# Patient Record
Sex: Female | Born: 1935
Health system: Southern US, Community
[De-identification: ages and names within clinical notes are randomized; demographics above are authoritative.]

## PROBLEM LIST (undated history)

## (undated) DIAGNOSIS — D696 Thrombocytopenia, unspecified: Secondary | ICD-10-CM

## (undated) DIAGNOSIS — I10 Essential (primary) hypertension: Secondary | ICD-10-CM

## (undated) DIAGNOSIS — I2510 Atherosclerotic heart disease of native coronary artery without angina pectoris: Secondary | ICD-10-CM

## (undated) DIAGNOSIS — I350 Nonrheumatic aortic (valve) stenosis: Secondary | ICD-10-CM

## (undated) DIAGNOSIS — N289 Disorder of kidney and ureter, unspecified: Secondary | ICD-10-CM

## (undated) DIAGNOSIS — E119 Type 2 diabetes mellitus without complications: Secondary | ICD-10-CM

## (undated) DIAGNOSIS — C8590 Non-Hodgkin lymphoma, unspecified, unspecified site: Secondary | ICD-10-CM

## (undated) DIAGNOSIS — I2129 ST elevation (STEMI) myocardial infarction involving other sites: Secondary | ICD-10-CM

## (undated) DIAGNOSIS — I519 Heart disease, unspecified: Secondary | ICD-10-CM

## (undated) DIAGNOSIS — I259 Chronic ischemic heart disease, unspecified: Secondary | ICD-10-CM

## (undated) DIAGNOSIS — N189 Chronic kidney disease, unspecified: Secondary | ICD-10-CM

## (undated) DIAGNOSIS — F0390 Unspecified dementia without behavioral disturbance: Secondary | ICD-10-CM

## (undated) DIAGNOSIS — D693 Immune thrombocytopenic purpura: Principal | ICD-10-CM

## (undated) DIAGNOSIS — I5022 Chronic systolic (congestive) heart failure: Secondary | ICD-10-CM

## (undated) DIAGNOSIS — I340 Nonrheumatic mitral (valve) insufficiency: Secondary | ICD-10-CM

## (undated) DIAGNOSIS — S065X9A Traumatic subdural hemorrhage with loss of consciousness of unspecified duration, initial encounter: Secondary | ICD-10-CM

## (undated) HISTORY — DX: Chronic systolic (congestive) heart failure: I50.22

## (undated) HISTORY — DX: Chronic ischemic heart disease, unspecified: I25.9

## (undated) HISTORY — DX: Nonrheumatic mitral (valve) insufficiency: I34.0

## (undated) HISTORY — DX: Disorder of kidney and ureter, unspecified: N28.9

## (undated) HISTORY — DX: Immune thrombocytopenic purpura: D69.3

## (undated) HISTORY — PX: EYE SURGERY: SHX253

## (undated) HISTORY — PX: CATARACT EXTRACTION W/ INTRAOCULAR LENS  IMPLANT, BILATERAL: SHX1307

## (undated) HISTORY — DX: ST elevation (STEMI) myocardial infarction involving other sites: I21.29

## (undated) HISTORY — DX: Heart disease, unspecified: I51.9

## (undated) HISTORY — DX: Unspecified dementia, unspecified severity, without behavioral disturbance, psychotic disturbance, mood disturbance, and anxiety: F03.90

## (undated) HISTORY — DX: Type 2 diabetes mellitus without complications: E11.9

---

## 2002-10-04 ENCOUNTER — Other Ambulatory Visit: Admission: RE | Admit: 2002-10-04 | Discharge: 2002-10-04 | Payer: Self-pay | Admitting: Otolaryngology

## 2002-10-06 ENCOUNTER — Encounter: Admission: RE | Admit: 2002-10-06 | Discharge: 2002-10-06 | Payer: Self-pay | Admitting: Otolaryngology

## 2002-10-06 ENCOUNTER — Encounter: Payer: Self-pay | Admitting: Otolaryngology

## 2002-10-21 ENCOUNTER — Encounter: Admission: RE | Admit: 2002-10-21 | Discharge: 2002-10-21 | Payer: Self-pay | Admitting: Otolaryngology

## 2002-10-21 ENCOUNTER — Encounter: Payer: Self-pay | Admitting: Otolaryngology

## 2002-10-22 ENCOUNTER — Ambulatory Visit (HOSPITAL_BASED_OUTPATIENT_CLINIC_OR_DEPARTMENT_OTHER): Admission: RE | Admit: 2002-10-22 | Discharge: 2002-10-22 | Payer: Self-pay | Admitting: Otolaryngology

## 2002-10-22 ENCOUNTER — Encounter (INDEPENDENT_AMBULATORY_CARE_PROVIDER_SITE_OTHER): Payer: Self-pay | Admitting: Specialist

## 2002-10-22 HISTORY — PX: OTHER SURGICAL HISTORY: SHX169

## 2002-11-01 ENCOUNTER — Encounter: Payer: Self-pay | Admitting: Otolaryngology

## 2002-11-01 ENCOUNTER — Encounter: Admission: RE | Admit: 2002-11-01 | Discharge: 2002-11-01 | Payer: Self-pay | Admitting: Otolaryngology

## 2002-11-22 ENCOUNTER — Encounter (INDEPENDENT_AMBULATORY_CARE_PROVIDER_SITE_OTHER): Payer: Self-pay | Admitting: Specialist

## 2002-11-22 ENCOUNTER — Ambulatory Visit (HOSPITAL_COMMUNITY): Admission: RE | Admit: 2002-11-22 | Discharge: 2002-11-22 | Payer: Self-pay | Admitting: Hematology & Oncology

## 2002-11-22 HISTORY — PX: BONE MARROW BIOPSY: SHX199

## 2002-11-23 ENCOUNTER — Ambulatory Visit (HOSPITAL_COMMUNITY): Admission: RE | Admit: 2002-11-23 | Discharge: 2002-11-23 | Payer: Self-pay | Admitting: Hematology & Oncology

## 2002-11-23 ENCOUNTER — Encounter: Payer: Self-pay | Admitting: Hematology & Oncology

## 2002-11-24 ENCOUNTER — Ambulatory Visit (HOSPITAL_COMMUNITY): Admission: RE | Admit: 2002-11-24 | Discharge: 2002-11-24 | Payer: Self-pay | Admitting: Hematology & Oncology

## 2002-11-24 ENCOUNTER — Encounter: Payer: Self-pay | Admitting: Hematology & Oncology

## 2002-12-07 ENCOUNTER — Encounter: Payer: Self-pay | Admitting: Hematology & Oncology

## 2002-12-07 ENCOUNTER — Ambulatory Visit (HOSPITAL_COMMUNITY): Admission: RE | Admit: 2002-12-07 | Discharge: 2002-12-07 | Payer: Self-pay | Admitting: Hematology & Oncology

## 2003-03-01 ENCOUNTER — Encounter: Payer: Self-pay | Admitting: Hematology & Oncology

## 2003-03-01 ENCOUNTER — Ambulatory Visit (HOSPITAL_COMMUNITY): Admission: RE | Admit: 2003-03-01 | Discharge: 2003-03-01 | Payer: Self-pay | Admitting: Hematology & Oncology

## 2003-05-05 ENCOUNTER — Ambulatory Visit (HOSPITAL_COMMUNITY): Admission: RE | Admit: 2003-05-05 | Discharge: 2003-05-05 | Payer: Self-pay | Admitting: Hematology & Oncology

## 2003-05-05 ENCOUNTER — Encounter: Payer: Self-pay | Admitting: Hematology & Oncology

## 2003-08-08 ENCOUNTER — Ambulatory Visit (HOSPITAL_COMMUNITY): Admission: RE | Admit: 2003-08-08 | Discharge: 2003-08-08 | Payer: Self-pay | Admitting: Hematology & Oncology

## 2003-10-24 ENCOUNTER — Ambulatory Visit (HOSPITAL_COMMUNITY): Admission: RE | Admit: 2003-10-24 | Discharge: 2003-10-24 | Payer: Self-pay | Admitting: Hematology & Oncology

## 2004-01-17 ENCOUNTER — Ambulatory Visit (HOSPITAL_COMMUNITY): Admission: RE | Admit: 2004-01-17 | Discharge: 2004-01-17 | Payer: Self-pay | Admitting: Hematology & Oncology

## 2004-02-06 ENCOUNTER — Ambulatory Visit (HOSPITAL_COMMUNITY): Admission: RE | Admit: 2004-02-06 | Discharge: 2004-02-06 | Payer: Self-pay | Admitting: Hematology & Oncology

## 2004-04-30 ENCOUNTER — Ambulatory Visit (HOSPITAL_COMMUNITY): Admission: RE | Admit: 2004-04-30 | Discharge: 2004-04-30 | Payer: Self-pay | Admitting: Hematology & Oncology

## 2004-09-10 ENCOUNTER — Ambulatory Visit (HOSPITAL_COMMUNITY): Admission: RE | Admit: 2004-09-10 | Discharge: 2004-09-10 | Payer: Self-pay | Admitting: Hematology & Oncology

## 2004-09-26 ENCOUNTER — Ambulatory Visit: Payer: Self-pay | Admitting: Hematology & Oncology

## 2005-01-28 ENCOUNTER — Ambulatory Visit (HOSPITAL_COMMUNITY): Admission: RE | Admit: 2005-01-28 | Discharge: 2005-01-28 | Payer: Self-pay | Admitting: Hematology & Oncology

## 2005-02-08 ENCOUNTER — Ambulatory Visit: Payer: Self-pay | Admitting: Hematology & Oncology

## 2005-07-01 ENCOUNTER — Ambulatory Visit (HOSPITAL_COMMUNITY): Admission: RE | Admit: 2005-07-01 | Discharge: 2005-07-01 | Payer: Self-pay | Admitting: Hematology & Oncology

## 2005-08-09 ENCOUNTER — Ambulatory Visit: Payer: Self-pay | Admitting: Hematology & Oncology

## 2005-09-23 ENCOUNTER — Encounter: Payer: Self-pay | Admitting: Cardiology

## 2005-09-23 ENCOUNTER — Ambulatory Visit: Payer: Self-pay

## 2006-01-13 ENCOUNTER — Ambulatory Visit (HOSPITAL_COMMUNITY): Admission: RE | Admit: 2006-01-13 | Discharge: 2006-01-13 | Payer: Self-pay | Admitting: Hematology & Oncology

## 2006-02-05 ENCOUNTER — Ambulatory Visit: Payer: Self-pay | Admitting: Hematology & Oncology

## 2006-02-10 LAB — CBC WITH DIFFERENTIAL/PLATELET
BASO%: 0.3 % (ref 0.0–2.0)
EOS%: 4.4 % (ref 0.0–7.0)
MCH: 33.4 pg (ref 26.0–34.0)
MCHC: 35.1 g/dL (ref 32.0–36.0)
MONO#: 0.3 10*3/uL (ref 0.1–0.9)
RDW: 13 % (ref 11.3–14.5)
WBC: 3.8 10*3/uL — ABNORMAL LOW (ref 3.9–10.0)
lymph#: 0.8 10*3/uL — ABNORMAL LOW (ref 0.9–3.3)

## 2006-02-10 LAB — COMPREHENSIVE METABOLIC PANEL
ALT: 10 U/L (ref 0–40)
AST: 22 U/L (ref 0–37)
Albumin: 4.5 g/dL (ref 3.5–5.2)
CO2: 25 mEq/L (ref 19–32)
Calcium: 9.4 mg/dL (ref 8.4–10.5)
Chloride: 101 mEq/L (ref 96–112)
Potassium: 3.9 mEq/L (ref 3.5–5.3)
Sodium: 136 mEq/L (ref 135–145)
Total Protein: 6.3 g/dL (ref 6.0–8.3)

## 2006-07-11 ENCOUNTER — Ambulatory Visit: Payer: Self-pay | Admitting: Internal Medicine

## 2006-07-24 ENCOUNTER — Ambulatory Visit: Payer: Self-pay | Admitting: Internal Medicine

## 2006-07-24 ENCOUNTER — Encounter (INDEPENDENT_AMBULATORY_CARE_PROVIDER_SITE_OTHER): Payer: Self-pay | Admitting: Specialist

## 2006-07-25 ENCOUNTER — Ambulatory Visit (HOSPITAL_COMMUNITY): Admission: RE | Admit: 2006-07-25 | Discharge: 2006-07-25 | Payer: Self-pay | Admitting: Hematology & Oncology

## 2006-08-06 ENCOUNTER — Ambulatory Visit: Payer: Self-pay | Admitting: Hematology & Oncology

## 2006-08-11 LAB — CBC WITH DIFFERENTIAL/PLATELET
BASO%: 0.6 % (ref 0.0–2.0)
Basophils Absolute: 0 10*3/uL (ref 0.0–0.1)
Eosinophils Absolute: 0.3 10*3/uL (ref 0.0–0.5)
HCT: 39.7 % (ref 34.8–46.6)
HGB: 13.9 g/dL (ref 11.6–15.9)
LYMPH%: 18.6 % (ref 14.0–48.0)
MONO#: 0.4 10*3/uL (ref 0.1–0.9)
NEUT#: 4.1 10*3/uL (ref 1.5–6.5)
NEUT%: 68.2 % (ref 39.6–76.8)
Platelets: 112 10*3/uL — ABNORMAL LOW (ref 145–400)
WBC: 6 10*3/uL (ref 3.9–10.0)
lymph#: 1.1 10*3/uL (ref 0.9–3.3)

## 2006-08-11 LAB — COMPREHENSIVE METABOLIC PANEL
ALT: 14 U/L (ref 0–35)
BUN: 22 mg/dL (ref 6–23)
CO2: 26 mEq/L (ref 19–32)
Calcium: 9.8 mg/dL (ref 8.4–10.5)
Chloride: 98 mEq/L (ref 96–112)
Creatinine, Ser: 1.21 mg/dL — ABNORMAL HIGH (ref 0.40–1.20)
Glucose, Bld: 243 mg/dL — ABNORMAL HIGH (ref 70–99)
Total Bilirubin: 0.8 mg/dL (ref 0.3–1.2)

## 2006-08-11 LAB — LACTATE DEHYDROGENASE: LDH: 148 U/L (ref 94–250)

## 2007-01-30 ENCOUNTER — Ambulatory Visit (HOSPITAL_COMMUNITY): Admission: RE | Admit: 2007-01-30 | Discharge: 2007-01-30 | Payer: Self-pay | Admitting: Hematology & Oncology

## 2007-02-05 ENCOUNTER — Ambulatory Visit: Payer: Self-pay | Admitting: Hematology & Oncology

## 2007-02-09 LAB — COMPREHENSIVE METABOLIC PANEL
ALT: 15 U/L (ref 0–35)
AST: 25 U/L (ref 0–37)
Albumin: 4 g/dL (ref 3.5–5.2)
CO2: 25 mEq/L (ref 19–32)
Calcium: 9.3 mg/dL (ref 8.4–10.5)
Chloride: 101 mEq/L (ref 96–112)
Potassium: 4 mEq/L (ref 3.5–5.3)

## 2007-02-09 LAB — CBC WITH DIFFERENTIAL/PLATELET
BASO%: 0.1 % (ref 0.0–2.0)
Basophils Absolute: 0 10*3/uL (ref 0.0–0.1)
EOS%: 6 % (ref 0.0–7.0)
HGB: 12.5 g/dL (ref 11.6–15.9)
MCH: 34.1 pg — ABNORMAL HIGH (ref 26.0–34.0)
MONO#: 0.3 10*3/uL (ref 0.1–0.9)
RDW: 12.7 % (ref 11.3–14.5)
WBC: 4.4 10*3/uL (ref 3.9–10.0)
lymph#: 0.8 10*3/uL — ABNORMAL LOW (ref 0.9–3.3)

## 2007-08-20 ENCOUNTER — Ambulatory Visit (HOSPITAL_COMMUNITY): Admission: RE | Admit: 2007-08-20 | Discharge: 2007-08-20 | Payer: Self-pay | Admitting: Hematology & Oncology

## 2007-08-25 ENCOUNTER — Ambulatory Visit: Payer: Self-pay | Admitting: Hematology & Oncology

## 2007-08-27 LAB — CBC WITH DIFFERENTIAL/PLATELET
BASO%: 0.1 % (ref 0.0–2.0)
LYMPH%: 17.7 % (ref 14.0–48.0)
MCHC: 36.2 g/dL — ABNORMAL HIGH (ref 32.0–36.0)
MCV: 94.3 fL (ref 81.0–101.0)
MONO%: 7.2 % (ref 0.0–13.0)
NEUT#: 3.9 10*3/uL (ref 1.5–6.5)
Platelets: 107 10*3/uL — ABNORMAL LOW (ref 145–400)
RBC: 4.27 10*6/uL (ref 3.70–5.32)
RDW: 13.4 % (ref 11.3–14.5)
WBC: 5.5 10*3/uL (ref 3.9–10.0)

## 2007-08-27 LAB — COMPREHENSIVE METABOLIC PANEL
ALT: 17 U/L (ref 0–35)
CO2: 26 mEq/L (ref 19–32)
Calcium: 9.4 mg/dL (ref 8.4–10.5)
Chloride: 95 mEq/L — ABNORMAL LOW (ref 96–112)
Creatinine, Ser: 0.84 mg/dL (ref 0.40–1.20)
Glucose, Bld: 214 mg/dL — ABNORMAL HIGH (ref 70–99)
Sodium: 134 mEq/L — ABNORMAL LOW (ref 135–145)
Total Bilirubin: 1.1 mg/dL (ref 0.3–1.2)
Total Protein: 6.6 g/dL (ref 6.0–8.3)

## 2007-08-27 LAB — LACTATE DEHYDROGENASE: LDH: 167 U/L (ref 94–250)

## 2007-11-20 ENCOUNTER — Ambulatory Visit: Payer: Self-pay | Admitting: Cardiology

## 2007-11-20 ENCOUNTER — Inpatient Hospital Stay (HOSPITAL_COMMUNITY): Admission: EM | Admit: 2007-11-20 | Discharge: 2007-11-27 | Payer: Self-pay | Admitting: Emergency Medicine

## 2007-11-20 ENCOUNTER — Encounter: Payer: Self-pay | Admitting: Cardiothoracic Surgery

## 2007-11-20 ENCOUNTER — Ambulatory Visit: Payer: Self-pay | Admitting: Cardiothoracic Surgery

## 2007-11-20 HISTORY — PX: CORONARY ARTERY BYPASS GRAFT: SHX141

## 2007-11-30 ENCOUNTER — Encounter: Admission: RE | Admit: 2007-11-30 | Discharge: 2007-11-30 | Payer: Self-pay | Admitting: Cardiothoracic Surgery

## 2007-11-30 ENCOUNTER — Ambulatory Visit: Payer: Self-pay | Admitting: Cardiothoracic Surgery

## 2007-12-03 ENCOUNTER — Ambulatory Visit: Payer: Self-pay | Admitting: Cardiothoracic Surgery

## 2007-12-07 ENCOUNTER — Inpatient Hospital Stay (HOSPITAL_COMMUNITY)
Admission: AD | Admit: 2007-12-07 | Discharge: 2007-12-11 | Payer: Self-pay | Admitting: Thoracic Surgery (Cardiothoracic Vascular Surgery)

## 2007-12-07 ENCOUNTER — Ambulatory Visit: Payer: Self-pay | Admitting: Thoracic Surgery (Cardiothoracic Vascular Surgery)

## 2007-12-17 ENCOUNTER — Ambulatory Visit: Payer: Self-pay | Admitting: Thoracic Surgery (Cardiothoracic Vascular Surgery)

## 2008-01-01 ENCOUNTER — Ambulatory Visit: Payer: Self-pay | Admitting: Cardiology

## 2008-01-08 ENCOUNTER — Ambulatory Visit: Payer: Self-pay | Admitting: Cardiothoracic Surgery

## 2008-01-08 ENCOUNTER — Encounter: Admission: RE | Admit: 2008-01-08 | Discharge: 2008-01-08 | Payer: Self-pay | Admitting: Cardiothoracic Surgery

## 2008-03-07 ENCOUNTER — Ambulatory Visit: Payer: Self-pay | Admitting: Cardiology

## 2008-03-08 ENCOUNTER — Ambulatory Visit: Payer: Self-pay | Admitting: Hematology & Oncology

## 2008-03-10 LAB — COMPREHENSIVE METABOLIC PANEL
AST: 29 U/L (ref 0–37)
Albumin: 4.6 g/dL (ref 3.5–5.2)
Alkaline Phosphatase: 101 U/L (ref 39–117)
BUN: 16 mg/dL (ref 6–23)
Calcium: 10 mg/dL (ref 8.4–10.5)
Chloride: 105 mEq/L (ref 96–112)
Glucose, Bld: 123 mg/dL — ABNORMAL HIGH (ref 70–99)
Total Protein: 6.9 g/dL (ref 6.0–8.3)

## 2008-03-10 LAB — CBC WITH DIFFERENTIAL (CANCER CENTER ONLY)
Eosinophils Absolute: 0.2 10*3/uL (ref 0.0–0.5)
MCH: 32.9 pg (ref 26.0–34.0)
MONO#: 0.3 10*3/uL (ref 0.1–0.9)
MONO%: 7.6 % (ref 0.0–13.0)
NEUT#: 2.9 10*3/uL (ref 1.5–6.5)
Platelets: 77 10*3/uL — ABNORMAL LOW (ref 145–400)
RBC: 4.53 10*6/uL (ref 3.70–5.32)
WBC: 4.3 10*3/uL (ref 3.9–10.0)

## 2008-05-11 ENCOUNTER — Encounter: Payer: Self-pay | Admitting: Emergency Medicine

## 2008-05-11 ENCOUNTER — Ambulatory Visit: Payer: Self-pay | Admitting: Cardiology

## 2008-05-18 ENCOUNTER — Ambulatory Visit: Payer: Self-pay | Admitting: Cardiology

## 2008-05-18 LAB — CONVERTED CEMR LAB
BUN: 20 mg/dL (ref 6–23)
GFR calc Af Amer: 62 mL/min
GFR calc non Af Amer: 51 mL/min
Glucose, Bld: 102 mg/dL — ABNORMAL HIGH (ref 70–99)
Sodium: 139 meq/L (ref 135–145)

## 2008-06-01 ENCOUNTER — Ambulatory Visit: Payer: Self-pay | Admitting: Cardiology

## 2008-06-01 LAB — CONVERTED CEMR LAB
BUN: 24 mg/dL — ABNORMAL HIGH (ref 6–23)
Calcium: 9.6 mg/dL (ref 8.4–10.5)
GFR calc Af Amer: 69 mL/min
GFR calc non Af Amer: 57 mL/min
Potassium: 4.2 meq/L (ref 3.5–5.1)

## 2008-06-29 ENCOUNTER — Ambulatory Visit: Payer: Self-pay | Admitting: Cardiology

## 2008-08-01 ENCOUNTER — Ambulatory Visit: Payer: Self-pay | Admitting: Cardiology

## 2008-09-08 ENCOUNTER — Ambulatory Visit: Payer: Self-pay | Admitting: Hematology & Oncology

## 2008-10-29 DIAGNOSIS — I2129 ST elevation (STEMI) myocardial infarction involving other sites: Secondary | ICD-10-CM | POA: Insufficient documentation

## 2008-10-29 DIAGNOSIS — I5022 Chronic systolic (congestive) heart failure: Secondary | ICD-10-CM | POA: Insufficient documentation

## 2008-10-29 DIAGNOSIS — I080 Rheumatic disorders of both mitral and aortic valves: Secondary | ICD-10-CM | POA: Insufficient documentation

## 2008-10-29 DIAGNOSIS — I259 Chronic ischemic heart disease, unspecified: Secondary | ICD-10-CM

## 2008-11-03 ENCOUNTER — Ambulatory Visit: Payer: Self-pay | Admitting: Cardiology

## 2008-11-03 ENCOUNTER — Encounter: Payer: Self-pay | Admitting: Cardiology

## 2008-11-03 DIAGNOSIS — I10 Essential (primary) hypertension: Secondary | ICD-10-CM

## 2008-11-03 LAB — CONVERTED CEMR LAB
Calcium: 9.7 mg/dL (ref 8.4–10.5)
Chloride: 98 meq/L (ref 96–112)
Creatinine, Ser: 0.9 mg/dL (ref 0.4–1.2)
GFR calc non Af Amer: 64.18 mL/min (ref >60)
Potassium: 4.4 meq/L (ref 3.5–5.1)

## 2008-12-23 ENCOUNTER — Telehealth: Payer: Self-pay | Admitting: Cardiology

## 2009-03-03 ENCOUNTER — Encounter (INDEPENDENT_AMBULATORY_CARE_PROVIDER_SITE_OTHER): Payer: Self-pay | Admitting: *Deleted

## 2009-05-10 ENCOUNTER — Ambulatory Visit: Payer: Self-pay | Admitting: Cardiology

## 2009-05-10 DIAGNOSIS — E782 Mixed hyperlipidemia: Secondary | ICD-10-CM

## 2009-05-10 DIAGNOSIS — I252 Old myocardial infarction: Secondary | ICD-10-CM | POA: Insufficient documentation

## 2009-05-10 DIAGNOSIS — I6529 Occlusion and stenosis of unspecified carotid artery: Secondary | ICD-10-CM

## 2009-06-20 ENCOUNTER — Ambulatory Visit (HOSPITAL_COMMUNITY): Admission: RE | Admit: 2009-06-20 | Discharge: 2009-06-20 | Payer: Self-pay | Admitting: Cardiology

## 2009-06-20 ENCOUNTER — Encounter: Payer: Self-pay | Admitting: Cardiology

## 2009-06-20 ENCOUNTER — Ambulatory Visit: Payer: Self-pay

## 2009-06-20 ENCOUNTER — Ambulatory Visit: Payer: Self-pay | Admitting: Cardiology

## 2009-06-20 ENCOUNTER — Ambulatory Visit: Payer: Self-pay | Admitting: Cardiovascular Disease

## 2009-07-04 ENCOUNTER — Encounter (INDEPENDENT_AMBULATORY_CARE_PROVIDER_SITE_OTHER): Payer: Self-pay | Admitting: *Deleted

## 2009-07-27 ENCOUNTER — Encounter: Payer: Self-pay | Admitting: Cardiology

## 2009-09-21 ENCOUNTER — Telehealth (INDEPENDENT_AMBULATORY_CARE_PROVIDER_SITE_OTHER): Payer: Self-pay | Admitting: *Deleted

## 2009-10-24 ENCOUNTER — Ambulatory Visit: Payer: Self-pay | Admitting: Cardiology

## 2009-10-30 ENCOUNTER — Telehealth (INDEPENDENT_AMBULATORY_CARE_PROVIDER_SITE_OTHER): Payer: Self-pay | Admitting: *Deleted

## 2009-11-03 ENCOUNTER — Ambulatory Visit: Payer: Self-pay

## 2009-11-20 ENCOUNTER — Encounter (INDEPENDENT_AMBULATORY_CARE_PROVIDER_SITE_OTHER): Payer: Self-pay | Admitting: *Deleted

## 2009-11-21 ENCOUNTER — Telehealth: Payer: Self-pay | Admitting: Cardiology

## 2009-11-23 ENCOUNTER — Ambulatory Visit: Payer: Self-pay | Admitting: Cardiology

## 2009-11-30 ENCOUNTER — Telehealth: Payer: Self-pay | Admitting: Cardiology

## 2009-11-30 LAB — CONVERTED CEMR LAB
BUN: 20 mg/dL (ref 6–23)
CO2: 31 meq/L (ref 19–32)
Chloride: 101 meq/L (ref 96–112)
Creatinine, Ser: 0.9 mg/dL (ref 0.4–1.2)
Glucose, Bld: 139 mg/dL — ABNORMAL HIGH (ref 70–99)

## 2010-02-15 ENCOUNTER — Ambulatory Visit: Payer: Self-pay | Admitting: Cardiology

## 2010-08-12 ENCOUNTER — Encounter: Payer: Self-pay | Admitting: Cardiothoracic Surgery

## 2010-08-12 ENCOUNTER — Encounter: Payer: Self-pay | Admitting: Hematology & Oncology

## 2010-08-19 LAB — CONVERTED CEMR LAB
AST: 29 units/L (ref 0–37)
BUN: 18 mg/dL (ref 6–23)
Cholesterol: 117 mg/dL (ref 0–200)
Creatinine, Ser: 1 mg/dL (ref 0.4–1.2)
Direct LDL: 64.4 mg/dL
HDL: 28 mg/dL — ABNORMAL LOW (ref >39.00)
Triglycerides: 209 mg/dL — ABNORMAL HIGH (ref 0.0–149.0)

## 2010-08-23 NOTE — Assessment & Plan Note (Signed)
Summary: bp check new med/saf  Nurse Visit   Vital Signs:  Patient profile:   77 year old female Height:      60 inches Pulse rhythm:   regular Resp:     16 per minute BP sitting:   153 / 77  (right arm)  Vitals Entered By: Charolotte Capuchin, RN (November 03, 2009 10:11 AM) CC: here for BP check Comments Pt came in for BP check  153/77 and HR 59  pt states her BP at home has been 135-146/60-70.  She states she taking all medications as rxed,  feels well and is very happy with how the new medication is working.  Will forward to Dr Daleen Squibb for his review.   Allergies: No Known Drug Allergies

## 2010-08-23 NOTE — Assessment & Plan Note (Signed)
Summary: per check out/sf   Visit Type:  6  mo f/u Primary Provider:  Lesle Chris  CC:  no cardiac complaints today.  History of Present Illness: Lisa James returns today for followup of her complex cardiac history, and risk factors.  She denies orthopnea, PND or peripheral edema. She is having no angina or ischemic symptoms. She denies any palpitations.  She does not check her blood pressure outside the office. She denies any headache, visual changes. She is compliant with her medications.  She's had no symptoms of TIAs or mini strokes.  Last echocardiogram November 2010. Ejection fraction is 40-45%, she is dyssynergy of the lateral and septal walls, cavity size was normal, mild LVH, mild aortic valve stenosis, mild to moderate mitral regurgitation with mildly thickened leaflets, mildly dilated left atrium, right sided structures appear to be normal.  Current Medications (verified): 1)  Furosemide 20 Mg Tabs (Furosemide) .Marland Kitchen.. 1 Tab Once Daily 2)  Potassium Chloride Crys Cr 20 Meq Cr-Tabs (Potassium Chloride Crys Cr) .Marland Kitchen.. 1 Tab Once Daily 3)  Aspirin 81 Mg Tbec (Aspirin) .... Take One Tablet By Mouth Daily 4)  Metformin Hcl 500 Mg Tabs (Metformin Hcl) .Marland Kitchen.. 1 Tab Two Times A Day 5)  Zoloft 50 Mg Tabs (Sertraline Hcl) .Marland Kitchen.. 1 Tab Once Daily 6)  Tums 500 Mg Chew (Calcium Carbonate Antacid) .... Use As Directed.as Needed 7)  Carvedilol 6.25 Mg Tabs (Carvedilol) .... Take 1 Tablet By Mouth Twice A Day 8)  Losartan Potassium 100 Mg Tabs (Losartan Potassium) .Marland Kitchen.. 1 Tab Once Daily 9)  Lipitor 20 Mg Tabs (Atorvastatin Calcium) .Marland Kitchen.. 1 Once Daily 10)  Vitamin E 400 Unit Caps (Vitamin E) .Marland Kitchen.. 1 Cap Once Daily  Allergies (verified): No Known Drug Allergies  Past History:  Past Surgical History: Last updated: 10/29/2008 CABG x5.Marland Kitchen05/07/2007  Left posterior iliac crest bone marrow biopsy and aspirate...11/22/2002 Excisional biopsy, submental lymph node...10/22/2002  Review of Systems    negative other than history of present illness  Vital Signs:  Patient profile:   77 year old female Height:      60 inches Weight:      127 pounds BMI:     24.89 Pulse rate:   66 / minute Pulse rhythm:   regular BP sitting:   170 / 90  (left arm) Cuff size:   large  Vitals Entered By: Lisa James, CMA (October 24, 2009 9:47 AM)  Physical Exam  General:  Well developed, well nourished, in no acute distress. Head:  normocephalic and atraumatic Eyes:  PERRLA/EOM intact; conjunctiva and lids normal. Neck:  Neck supple, no JVD. No masses, thyromegaly or abnormal cervical nodes. Lungs:  Clear bilaterally to auscultation and percussion. Heart:  systolic murmur at the apex, PMI inferolaterally displaced, regular rate and rhythm, no gallop Msk:  decreased ROM.   Pulses:  pulses normal in all 4 extremities Extremities:  No clubbing or cyanosis. Neurologic:  Alert and oriented x 3. Skin:  Intact without lesions or rashes. Psych:  Normal affect.   Impression & Recommendations:  Problem # 1:  HYPERTENSION, BENIGN (ICD-401.1) Assessment Deteriorated  I rechecked her pressure. She still running a systolic about 170 and diastolic about 85-90. She is beta blocked, has no edema, and is maxed out on losartan. I will add amlodipine 2.5 mg p.o. q. day. We will check electrolytes today. Sodium restriction enforced. She may get some edema with amlodipine. She is away check her blood pressures or she can return here in about 10 days  for blood pressure followup with the nurse. Her goal blood pressure is 140 or less over less than 90. Her updated medication list for this problem includes:    Furosemide 20 Mg Tabs (Furosemide) .Marland Kitchen... 1 tab once daily    Aspirin 81 Mg Tbec (Aspirin) .Marland Kitchen... Take one tablet by mouth daily    Carvedilol 6.25 Mg Tabs (Carvedilol) .Marland Kitchen... Take 1 tablet by mouth twice a day    Losartan Potassium 100 Mg Tabs (Losartan potassium) .Marland Kitchen... 1 tab once daily    Amlodipine Besylate 2.5 Mg  Tabs (Amlodipine besylate) .Marland Kitchen... 1 once daily  Problem # 2:  OLD MYOCARDIAL INFARCTION (ICD-412) Assessment: Unchanged  Her updated medication list for this problem includes:    Aspirin 81 Mg Tbec (Aspirin) .Marland Kitchen... Take one tablet by mouth daily    Carvedilol 6.25 Mg Tabs (Carvedilol) .Marland Kitchen... Take 1 tablet by mouth twice a day    Amlodipine Besylate 2.5 Mg Tabs (Amlodipine besylate) .Marland Kitchen... 1 once daily  Problem # 3:  SYSTOLIC HEART FAILURE, CHRONIC (ICD-428.22) Assessment: Unchanged  Her updated medication list for this problem includes:    Furosemide 20 Mg Tabs (Furosemide) .Marland Kitchen... 1 tab once daily    Aspirin 81 Mg Tbec (Aspirin) .Marland Kitchen... Take one tablet by mouth daily    Carvedilol 6.25 Mg Tabs (Carvedilol) .Marland Kitchen... Take 1 tablet by mouth twice a day    Losartan Potassium 100 Mg Tabs (Losartan potassium) .Marland Kitchen... 1 tab once daily    Amlodipine Besylate 2.5 Mg Tabs (Amlodipine besylate) .Marland Kitchen... 1 once daily  Problem # 4:  CAROTID ARTERY STENOSIS, WITHOUT INFARCTION (ICD-433.10) Assessment: Unchanged Last carotid Dopplers November 2010. Stable. Followup in one year Her updated medication list for this problem includes:    Aspirin 81 Mg Tbec (Aspirin) .Marland Kitchen... Take one tablet by mouth daily  Problem # 5:  ISCHEMIC HEART DISEASE (ICD-414.9) Assessment: Unchanged  Her updated medication list for this problem includes:    Aspirin 81 Mg Tbec (Aspirin) .Marland Kitchen... Take one tablet by mouth daily    Carvedilol 6.25 Mg Tabs (Carvedilol) .Marland Kitchen... Take 1 tablet by mouth twice a day    Amlodipine Besylate 2.5 Mg Tabs (Amlodipine besylate) .Marland Kitchen... 1 once daily  Problem # 6:  MITRAL REGURGITATION (ICD-396.3) Assessment: Unchanged  Patient Instructions: 1)  Your physician recommends that you schedule a follow-up appointment in: B/P CHECK 10 DAYS AND 3 MONTHS WITH DR Burnis Kaser 2)  Your physician recommends that you return for lab work QI:ONGE TODAY V58.69 401.1 3)  Your physician has recommended you make the following change in  your medication: AMLODIPINE 2.5 MG EVERY AM 4)  Your physician has requested that you regularly monitor and record your blood pressure readings at home.  Please use the same machine at the same time of day to check your readings and record them to bring to your follow-up visit. GOAL B/P IS LESS THAN 140/90 Prescriptions: AMLODIPINE BESYLATE 2.5 MG TABS (AMLODIPINE BESYLATE) 1 once daily  #30 x 11   Entered by:   Scherrie Bateman, LPN   Authorized by:   Gaylord Shih, MD, [facility]   Signed by:   Scherrie Bateman, LPN on 95/28/4132   Method used:   Electronically to        [facility]  [address] (retail)       [address]       [place], [place]  [address]       Ph: [phone] or [phone]       Fax: [phone]  RxID:   [id number]

## 2010-08-23 NOTE — Progress Notes (Signed)
  Pt. signed ROI, sent over to [facility] in [facility] for her to process. Cala Bradford Mesiemore  September 21, 2009 10:33 AM'

## 2010-08-23 NOTE — Progress Notes (Signed)
Summary: calling back  Phone Note Call from Patient Call back at Home Phone [phone]   Caller: Patient Reason for Call: Talk to Nurse Summary of Call: per pt calling back to speak with christine. Initial call taken by: Lorne Skeens,  Nov 21, 2009 11:58 AM  Follow-up for Phone Call        [facility] Scherrie Bateman, LPN  Nov 21, 9560 1:23 PM lmtcb Scherrie Bateman, LPN  Nov 23, 1306 9:05 AM spoke with pt re the need for labs will come in on 11/23/09 at 10:30 Follow-up by: Scherrie Bateman, LPN,  Nov 22, 6576 9:39 AM

## 2010-08-23 NOTE — Letter (Signed)
Summary: [facility]  [facility]   Imported By: Marylou Mccoy 11/02/2009 16:54:51  _____________________________________________________________________  External Attachment:    Type:   Image     Comment:   External Document

## 2010-08-23 NOTE — Progress Notes (Signed)
  Phone Note Outgoing Call   Call placed by: Scherrie Bateman, LPN,  October 30, 2009 9:27 AM Call placed to: Patient Summary of Call: [facility] RE LEFT OFFICE  BEFORE LABS WERE DONE ON 10/24/09 Initial call taken by: Scherrie Bateman, LPN,  October 30, 2009 9:28 AM  Follow-up for Phone Call        SPOKE WITH SON RE  THE NEED FOR LABS WILL LET MOTHER KNOW TO CALL  TO GET APPT./CY  Follow-up by: Scherrie Bateman, LPN,  October 31, 2009 11:43 AM  Additional Follow-up for Phone Call Additional follow up Details #1::        LMTCB Scherrie Bateman, LPN  November 01, 2009 12:06 PM LMTCB Scherrie Bateman, LPN  November 08, 2009 8:37 AM    Additional Follow-up for Phone Call Additional follow up Details #2::    LETTER SENT TO PT TO CALL AND SCHEDULE LABS. Follow-up by: Scherrie Bateman, LPN,  Nov 21, 6043 9:21 AM

## 2010-08-23 NOTE — Letter (Signed)
Summary: Generic Letter  Architectural technologist, Main Office  [address]   [place], [place] [address]   Phone: [phone]  Fax: [phone]        Nov 20, 2009 MRN: [id number]    Lisa James [address] [place], [place]  [address]    Dear Ms. [name],   I HAVE MADE SEVERAL ATTEMPTS BY PHONE  RE THE NEED FOR LABS. PLEASE CALL OUR OFFICE TO SCHEDULE.         Sincerely,  Scherrie Bateman, LPN  This letter has been electronically signed by your physician.

## 2010-08-23 NOTE — Progress Notes (Signed)
Summary: returning call  Phone Note Call from Patient Call back at Home Phone [phone]   Caller: Patient Reason for Call: Talk to Nurse Summary of Call: returning call  Initial call taken by: Migdalia Dk,  Nov 30, 2009 11:04 AM  Follow-up for Phone Call        Phone Call Completed Follow-up by: Scherrie Bateman, LPN,  Nov 30, 2009 11:07 AM

## 2010-08-23 NOTE — Assessment & Plan Note (Signed)
Summary: per check out/sf   Visit Type:  rov Primary Provider:  Lesle Chris  CC:  pt states she may get chest discomfort at times and says she will take ASA then it  goes away...denies any other edema or sob...pt states she feels good.  History of Present Illness: Ms Aja returns today for evaluation management of complex cardiac and vascular issues.  She's having no angina or ischemic symptoms. If she pushes herself she has some dyspnea on exertion. She denies orthopnea, PND or edema. He still works.  She's having no symptoms of TIAs or mini strokes. He is very compliant.  Current Medications (verified): 1)  Furosemide 20 Mg Tabs (Furosemide) .Marland Kitchen.. 1 Tab Once Daily 2)  Potassium Chloride Crys Cr 20 Meq Cr-Tabs (Potassium Chloride Crys Cr) .Marland Kitchen.. 1 Tab Once Daily 3)  Aspirin 81 Mg Tbec (Aspirin) .... Take One Tablet By Mouth Daily 4)  Metformin Hcl 500 Mg Tabs (Metformin Hcl) .Marland Kitchen.. 1 Tab Two Times A Day 5)  Zoloft 50 Mg Tabs (Sertraline Hcl) .Marland Kitchen.. 1 Tab Once Daily 6)  Tums 500 Mg Chew (Calcium Carbonate Antacid) .... Use As Directed.as Needed 7)  Carvedilol 6.25 Mg Tabs (Carvedilol) .... Take 1 Tablet By Mouth Twice A Day 8)  Losartan Potassium 100 Mg Tabs (Losartan Potassium) .Marland Kitchen.. 1 Tab Once Daily 9)  Lipitor 20 Mg Tabs (Atorvastatin Calcium) .Marland Kitchen.. 1 Once Daily 10)  Vitamin E 400 Unit Caps (Vitamin E) .Marland Kitchen.. 1 Cap Once Daily 11)  Amlodipine Besylate 2.5 Mg Tabs (Amlodipine Besylate) .Marland Kitchen.. 1 Once Daily  Allergies (verified): No Known Drug Allergies  Past History:  Past Medical History: Last updated: 10/29/2008 MYOCARDIAL INFARCTION, ACUTE, LATERAL Kirsten Spearing (ICD-410.50) SYSTOLIC HEART FAILURE, CHRONIC (ICD-428.22) ISCHEMIC HEART DISEASE (ICD-414.9) MITRAL REGURGITATION (ICD-396.3)  Past Surgical History: Last updated: 10/29/2008 CABG x5.Marland Kitchen05/07/2007  Left posterior iliac crest bone marrow biopsy and aspirate...11/22/2002 Excisional biopsy, submental lymph node...10/22/2002  Family  History: Last updated: 10/29/2008 Family History of Coronary Artery Disease:  Father died of MI and diabetes  Social History: Last updated: 10/29/2008 Widowed  Tobacco Use - No.  Alcohol Use - no Drug Use - no  Risk Factors: Smoking Status: never (10/29/2008)  Review of Systems       negative other than history of present illness  Vital Signs:  Patient profile:   77 year old female Height:      60 inches Weight:      128 pounds BMI:     25.09 Pulse rate:   68 / minute Pulse rhythm:   regular BP sitting:   128 / 72  (left arm) Cuff size:   large  Vitals Entered By: Danielle Rankin, CMA (February 15, 2010 10:45 AM)  Physical Exam  General:  elderly, no acute distress Head:  normocephalic and atraumatic Eyes:  PERRLA/EOM intact; conjunctiva and lids normal. Neck:  Neck supple, no JVD. No masses, thyromegaly or abnormal cervical nodes. Lungs:  Clear bilaterally to auscultation and percussion. Heart:  PMI displaced inferolaterally, soft systolic murmur at the apex, no gallop, regular rate and rhythm Msk:  decreased ROM.  decreased ROM.   Pulses:  pulses normal in all 4 extremities Extremities:  No clubbing or cyanosis. Neurologic:  Alert and oriented x 3. Skin:  Intact without lesions or rashes. Psych:  Normal affect.   Impression & Recommendations:  Problem # 1:  ISCHEMIC HEART DISEASE (ICD-414.9) Assessment Unchanged  Her updated medication list for this problem includes:    Aspirin 81 Mg Tbec (Aspirin) .Marland Kitchen... Take  one tablet by mouth daily    Carvedilol 6.25 Mg Tabs (Carvedilol) .Marland Kitchen... Take 1 tablet by mouth twice a day    Amlodipine Besylate 2.5 Mg Tabs (Amlodipine besylate) .Marland Kitchen... 1 once daily  Problem # 2:  CAROTID ARTERY STENOSIS, WITHOUT INFARCTION (ICD-433.10) Assessment: Unchanged  Her updated medication list for this problem includes:    Aspirin 81 Mg Tbec (Aspirin) .Marland Kitchen... Take one tablet by mouth daily  Problem # 3:  HYPERLIPIDEMIA TYPE IIB / III  (ICD-272.2)  Her updated medication list for this problem includes:    Lipitor 20 Mg Tabs (Atorvastatin calcium) .Marland Kitchen... 1 once daily  Problem # 4:  OLD MYOCARDIAL INFARCTION (ICD-412) Assessment: Unchanged  Her updated medication list for this problem includes:    Aspirin 81 Mg Tbec (Aspirin) .Marland Kitchen... Take one tablet by mouth daily    Carvedilol 6.25 Mg Tabs (Carvedilol) .Marland Kitchen... Take 1 tablet by mouth twice a day    Amlodipine Besylate 2.5 Mg Tabs (Amlodipine besylate) .Marland Kitchen... 1 once daily  Problem # 5:  HYPERTENSION, BENIGN (ICD-401.1)  Her updated medication list for this problem includes:    Furosemide 20 Mg Tabs (Furosemide) .Marland Kitchen... 1 tab once daily    Aspirin 81 Mg Tbec (Aspirin) .Marland Kitchen... Take one tablet by mouth daily    Carvedilol 6.25 Mg Tabs (Carvedilol) .Marland Kitchen... Take 1 tablet by mouth twice a day    Losartan Potassium 100 Mg Tabs (Losartan potassium) .Marland Kitchen... 1 tab once daily    Amlodipine Besylate 2.5 Mg Tabs (Amlodipine besylate) .Marland Kitchen... 1 once daily  Problem # 6:  MITRAL REGURGITATION (ICD-396.3) Assessment: Unchanged  Patient Instructions: 1)  Your physician recommends that you schedule a follow-up appointment in: 6 MONTHS WITH DR Randie Bloodgood 2)  Your physician recommends that you continue on your current medications as directed. Please refer to the Current Medication list given to you today.

## 2010-09-19 ENCOUNTER — Encounter: Payer: Self-pay | Admitting: Cardiology

## 2010-11-06 ENCOUNTER — Other Ambulatory Visit: Payer: Self-pay | Admitting: Cardiology

## 2010-11-22 ENCOUNTER — Encounter: Payer: Self-pay | Admitting: Cardiology

## 2010-11-29 ENCOUNTER — Other Ambulatory Visit: Payer: Self-pay | Admitting: Surgery

## 2010-11-29 ENCOUNTER — Ambulatory Visit: Payer: Self-pay | Admitting: Cardiology

## 2010-12-03 ENCOUNTER — Encounter: Payer: Self-pay | Admitting: Cardiology

## 2010-12-04 NOTE — Op Note (Signed)
Lisa James, Lisa James NO.:  [id number]   MEDICAL RECORD NO.:  [id number]          PATIENT TYPE:  INP   LOCATION:  2311                         FACILITY:  [facility]   PHYSICIAN:  Sheliah Plane, MD    DATE OF BIRTH:  [birth year]   DATE OF PROCEDURE:  11/20/2007  DATE OF DISCHARGE:                               OPERATIVE REPORT   PREOPERATIVE DIAGNOSIS:  Acute myocardial infarction with critical  coronary anatomy.   POSTOPERATIVE DIAGNOSIS:  Acute myocardial infarction with critical  coronary anatomy.   SURGICAL PROCEDURES:  Emergency coronary artery bypass grafting x5 with  left internal mammary to left anterior descending coronary artery,  sequential reverse saphenous vein graft to the first and second obtuse  marginal, reverse saphenous vein graft sequentially to the posterior  descending and posterior lateral branches of the right coronary artery  with right leg endovein harvesting.   SURGEON:  Sheliah Plane, MD   FIRST ASSISTANT:  Rowe Clack, PA-C   BRIEF HISTORY:  The patient is 77 year old female, who on the same day  of surgery after doing some yard work at her home began having sudden  chest pain.  She was brought to the emergency room STEMI by EMS with  EKG changes of acute inferior/posterior myocardial infarction.  She was  taken directly to the cardiac cath lab by Dr. Riley Kill and cardiac  catheterization revealed depressed LV function with mitral regurgitation  and critical coronary anatomy with 99% stenosis of the proximal right,  90% stenosis of the mid posterior descending, and diffuse disease  throughout the right, hypodense area of 90% or more, left main  obstruction, and total occlusion of the circumflex after the takeoff of  a high obtuse marginal.  Because of the critical anatomy, emergency  coronary artery bypass grafting was recommended and the patient agreed.  This was also discussed with the patient's son.  Consideration for intra-  aortic balloon pump was discussed after the patient had severe iliac  disease, making even difficult for cardiac catheterization, so intra-  aortic balloon pump was not placed.   DESCRIPTION OF PROCEDURE:  With Swan-Ganz and arterial line monitors in  place, the patient underwent general endotracheal anesthesia.  A  transesophageal echo probe was placed.  The patient structurally had  intact mitral valve leaflets and no evidence of flare.  There was 1+  central mitral regurgitation definitely was not as severe as evidenced  on the cardiac catheterization.  The skin on the chest and legs was  prepped with Betadine and draped in usual sterile manner.  Using a  [employer]ant endovein harvesting system, the vein was harvested from the  right thigh and calf.  There was a small but of adequate quality and  sized for the patient's small coronary vessels.  Median sternotomy was  performed.  Left internal mammary artery was dissected down as pedicle  graft.  The distal artery was divided had good free flow.  The  pericardium was opened.  Overall, ventricular function appeared  depressed, especially the inferior wall.  The patient had no critical  EKG changes; however, just prior to  catheterization, began having  significant ST elevation, global hypokinesis.  The patient was placed on  cardiopulmonary bypass 2.4 liters per minute per meter squared.  Sites  of anastomosis were selected.  Aortic root vent cardioplegia needle was  introduced into the ascending aorta.  The patient's aortic cross-clamp  was applied and 500 cc of cold blood potassium cardioplegia was  administered with diastolic arrest of the heart.  Attention was turned  first to the right system.  The heart was elevated.  The posterior  descending in the midportion and the larger posterior lateral branches  of each were opened using diamond-type side-to-side anastomosis with a  running 8-0 Prolene.  It was carried out to the mid posterior   descending.  The distal extend of the same vein was then carried onto  the posterior lateral branch which was slightly larger.  Using a running  7-0 Prolene, distal anastomosis was performed.  The valves itself was  approximately 1.3-1.4 mm in size.  The heart was then further elevated.  The very distal circumflex branch was less than 1 mm and not felt big  enough to bypass.  The first obtuse marginal was a small, thin-walled  vessel admitting a 1-mm probe.  The vessel was opened and using running  8-0 Prolene, a side-to-side anastomosis was carried out.  The second  obtuse marginal was partially intramyocardial.  It was a larger and  admitted a 1.5-mm probe.  Using a running 7-0 Prolene, distal  anastomosis was performed.  The attention was then turned to the  anterior wall.  The only vessel evident on the anterior wall was opened  and was 1.2-1.3 mm in size.  Using running 8-0 Prolene, left internal  mammary artery was anastomosed to left anterior descending coronary  artery.  There was rise in myocardial septal temperature with removal of  the bulldog on the mammary artery.  The bulldog was replaced with cross-  clamp still in place.  Two punch aortotomies were performed in the  ascending aorta and each of the two vein grafts were anastomosed to the  ascending aorta with a running 7-0 Prolene.  Air was evacuated from the  heart and from the aorta and the aortic cross-clamp was removed after  the bulldog on the mammary artery had been removed and there was rise in  myocardial septal temperature.  Total crossclamp time was 97 minutes.  The patient was initially in complete heart block but ultimately  converted to a sinus rhythm and was paced for a slow rate.  Sites of  anastomoses were inspected free of bleeding.  The patient was started on  milrinone and epinephrine infusion.  With this, she was weaned from  cardiopulmonary bypass and remained stable with reasonable cardiac index   applied just over to.  She was decannulated in usual fashion.  Protamine  sulfate was administered.  A left pleural tube and a Blake mediastinal  drain were left in place.  Pericardium was reapproximated.  Sternum  closed with #6 stainless steel wire.  Fascia was closed with interrupted  0 Vicryl, running 3-0 Vicryl in the subcutaneous tissue, 4-0  subcuticular stitch in the skin edges.  Dry dressing was applied.  Sponge and needle count was reported as correct at completion of  procedure.  The patient tolerated the procedure well without obvious  complication and was transferred to Surgical Intensive Care Unit for  further postoperative care.      Sheliah Plane, MD  Electronically Signed  EG/MEDQ  D:  11/22/2007  T:  11/22/2007  Job:  [id number]   cc:   Arturo Morton. Riley Kill, MD, [facility]  [name]. [name], M.D.

## 2010-12-04 NOTE — Assessment & Plan Note (Signed)
[facility]                            CARDIOLOGY OFFICE NOTE   NAME:TUTTLEDalal, Livengood                    MRN:          [id number]  DATE:06/01/2008                            DOB:          11-17-1927    Ms. Crocker returns today for close followup.  Please see my notes last  one being May 18, 2008.   She has lost down to 119 pounds in her scales which has been stable over  the last 5 days.  She denies any orthopnea, PND, or peripheral edema.  Her shortness of breath is improved with exertion.   On last visit, we increased her Carvedilol to 25 b.i.d.  We increased  her lisinopril to 5 mg b.i.d.  We will check a Chem-7 today.   PHYSICAL EXAMINATION:  GENERAL:  She looks remarkably good today.  VITAL SIGNS:  Her blood pressure is 122/62, her pulse is 72 and regular,  which is down from 103 initially.  Her weight is 123 down 7 from May 18, 2008.  NECK:  No JVD.  LUNGS:  Clear to auscultation and percussion.  HEART:  A systolic murmur at the apex.  There is no gallop.  ABDOMEN:  Soft, good bowel sounds.  No midline bruits.  EXTREMITIES:  No cyanosis, clubbing, or edema.  Pulses are intact.   ASSESSMENT AND PLAN:  Ms. Cleek is now euvolemic.  We have made no  further changes in her medical program.  I will plan on seeing her back  in 4 weeks.  We will check a chemistry profile today.     Thomas C. Daleen Squibb, MD, [facility]  Electronically Signed    TCW/MedQ  DD: 06/01/2008  DT: 06/01/2008  Job #: [id number]

## 2010-12-04 NOTE — Assessment & Plan Note (Signed)
Lisa James                            CARDIOLOGY OFFICE NOTE   NAME:TUTTLEDiva, Lemberger                    MRN:          [id number]  DATE:08/01/2008                            DOB:          [birth year]    Lisa James comes in today for followup.  Please refer to my previous  notes for her problem list especially the note May 11, 2008.   We have adjusted her medicines over the last several months.  She has  become progressively better.  She denies orthopnea, PND, or peripheral  edema.  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  She wants to go back next Monday, August 08, 2008.   Her meds are unchanged since last visit.  We asked her to increase her  carvedilol 12.5 b.i.d. for the second time, but she is still taking 6.25  b.i.d.   Other cardiovascular meds include:  1. Lisinopril 5 mg p.o. b.i.d.  2. Lipitor 20 mg a day.  3. Furosemide 20 mg a day.  4. Potassium 20 mEq a day.  5. Enteric-coated aspirin 81 mg a day.   PHYSICAL EXAMINATION:  VITAL SIGNS:  Blood pressure today is 144/70, her  pulse is 72 and regular, and weight is 127.  HEENT:  Skin is pale and dry, otherwise unremarkable.  NECK:  Supple.  There is no JVD.  LUNGS:  Clear to auscultation and percussion.  HEART:  A displaced PMI.  She has a very soft systolic murmur at the  apex.  No gallop.  ABDOMEN:  Soft, good bowel sounds.  No midline bruit.  No hepatomegaly.  EXTREMITIES:  No cyanosis, clubbing, or edema.  Pulses are intact.  NEUROLOGIC:  Intact.  MUSCULOSKELETAL:  Chronic arthritic changes.  SKIN:  Few ecchymoses and is warm and dry.   ASSESSMENT AND PLAN:  Lisa James is doing well.  We will continue with  her current medical program for now.  I will see her back again in 3-6  months.     Thomas C. Daleen Squibb, MD, [facility]  Electronically Signed    TCW/MedQ  DD: 08/01/2008  DT: 08/02/2008  Job #: [id number]   cc:   Brett Canales A. [name], M.D.

## 2010-12-04 NOTE — H&P (Signed)
Lisa James, COLBAUGH NO.:  [id number]   MEDICAL RECORD NO.:  [id number]          PATIENT TYPE:  INP   LOCATION:  2027                         FACILITY:  [facility]   PHYSICIAN:  Salvatore Decent. Cornelius Moras, M.D. DATE OF BIRTH:  12-Sep-1927   DATE OF ADMISSION:  12/07/2007  DATE OF DISCHARGE:                              HISTORY & PHYSICAL   REASON FOR ADMISSION:  Right lower extremity cellulitis.   HISTORY OF PRESENT ILLNESS:  The patient is a 77 year old white female  who is status post recent emergency coronary artery bypass grafting on  Nov 20, 2007, by Dr. Tyrone Sage.  After an uneventful postoperative course,  she was discharged home in good condition on Nov 27, 2007.  Following her  discharge, she developed increasing dyspnea and lower extremity edema  and was seen in the office by Dr. Tyrone Sage on Nov 30, 2007.  At that  time, she was started on increased doses of Lasix at 40 mg b.i.d.  She  noted a blister on the forefoot of her right foot with pitting edema.  She returned for a recheck on Dec 03, 2007 and had had significant  improvement of her shortness of breath and lower extremity edema.  However, at that time, her right foot blister had popped.  She was given  a prescription for Silvadene to apply topically to the open area on her  right forefoot and was continued on aggressive diuresis.  Her symptoms  continued to improve until approximately 2 days later at which point she  developed significant erythema around open abrasion on her right  forefoot.  Over the ensuing days, this continued to worsen and her foot  has become markedly swollen and tender.  She denies any fevers or chills  and also reports that she has had no shortness of breath, orthopnea, or  chest pain.  Because of her worsening foot, she was seen in the office  today by Dr. Cornelius Moras and was diagnosed with right foot cellulitis.  She is  currently being admitted for IV antibiotic therapy.   PHYSICAL  EXAMINATION:  VITAL SIGNS:  Blood pressure is 185/65, pulse of  72 and regular, respirations 18 and unlabored.  O2 sat 93% on room air.  Temperature 96.7.  GENERAL:  This is an elderly female in no acute distress.  HEART:  Regular rate and rhythm without murmurs, rubs, or gallops.  LUNGS:  Clear to auscultation.  Her sternal wound has healed well and  her sternum is stable to palpation.  EXTREMITIES:  Her left lower extremity shows no erythema or edema.  Her  right forefoot is markedly erythematous about two-thirds of the way up  with a 3-4 cm open abraded area just proximal to her toes.  There is  trace of erythema extending up into her calf.  She has 2+ edema of the  foot and ankle and some mild edema of the calf.  The area is warm to  touch and tender.  The EVH incision in the right lower extremity also  have some mild surrounding erythema.   ASSESSMENT AND PLAN:  This is a  77 year old female who is status post  emergency coronary artery bypass graft on Nov 20, 2007.  She now presents  with right lower extremity cellulitis in the setting of diabetes  mellitus.  She  has been seen in the office today by Dr. Cornelius Moras and will be admitted and  started on IV vancomycin and Cipro.  We will also start topical local  wound care to the open area on her forefoot.  We will continue her on  aggressive diuresis and check labs and blood cultures upon admission.      Coral Ceo, P.A.      Salvatore Decent. Cornelius Moras, M.D.  Electronically Signed    GC/MEDQ  D:  12/07/2007  T:  12/08/2007  Job:  [id number]   cc:   Dr. Dortha Schwalbe C. Wall, MD, [facility]

## 2010-12-04 NOTE — Discharge Summary (Signed)
NAMEJOLENE, James NO.:  [id number]   MEDICAL RECORD NO.:  [id number]          PATIENT TYPE:  INP   LOCATION:  2008                         FACILITY:  [facility]   PHYSICIAN:  Sheliah Plane, MD    DATE OF BIRTH:  [birth year]   DATE OF ADMISSION:  11/20/2007  DATE OF DISCHARGE:  11/27/2007                               DISCHARGE SUMMARY   PRIMARY ADMITTING DIAGNOSIS:  Chest pain.   ADDITIONAL/DISCHARGE DIAGNOSES:  1. ST-elevation myocardial infarction.  2. Coronary artery disease.  3. Hypertension.  4. Type 2 diabetes mellitus.  5. History of lymphoma status post chemotherapy.   PROCEDURES PERFORMED:  1. Emergency cardiac catheterization.  2. Emergency coronary artery bypass grafting x5 (left internal mammary      artery to the LAD, saphenous vein graft sequentially to the first      and second obtuse marginal, sequential saphenous vein graft to the      PDA and PL).  3. Endoscopic vein harvest, right leg.   HISTORY:  The patient is a 77 year old white female who developed acute  onset of shortness of breath on the date of this admission.  She had had  some shortness of breath earlier while working in the yard, but this  episode was acute and at rest and was associated with some mild  diaphoresis.  She subsequently called EMS and was brought to the  emergency department at [facility]  She was found to have  evidence of an inferior acute ST-elevation myocardial infarction and was  given four baby aspirin and was brought directly to the cardiac cath  lab.   HOSPITAL COURSE:  The patient was transferred from the emergency  department to the cath lab where she underwent cardiac catheterization  by Dr. Riley Kill.  She was found to have 80-90% distal left main with  total occlusion of the circumflex, a diffusely diseased LAD with  multiple 90% stenoses even very distally in the vessel, a proximal 99%  stenosis in the right coronary artery, mid posterior  descending and 90%  along the posterolateral branch.  It was difficult to quantify the  amount of mitral regurgitation, but it was estimated at 2+.  Because of  these findings, cardiac surgery consult was called to the cath lab.  Dr.  Tyrone Sage saw the patient and felt that she should undergo emergency  revascularization at this time.  All the risks, benefits, and  alternatives of surgery were explained to the patient and she was  transferred directly to the operating room.  She underwent emergency  CABG x5 performed by Dr. Tyrone Sage.  She tolerated the procedure well and  was transferred to the SICU in stable condition.  She was able to be  extubated shortly after surgery.  She was hemodynamically stable and  doing well on postop day #1.  She initially required Neo-Synephrine,  epinephrine, and milrinone drips postoperatively; however, these were  weaned and discontinued over the course of her second postoperative day.  She remained in the unit until postop day #3 and at that point was  transferred to the step-down unit.  Overall, her postoperative course  has been uneventful.  She has been started on a beta-blocker, statin,  and an ACE inhibitor.  She appears to be tolerating these medications  well.  She has been very volume overloaded and was started on Lasix for  which she is responding well.  Her incisions are all healing well.  She  remains approximately 10 kilograms above her preoperative weight with  significant lower extremity edema on physical exam.  She has been  afebrile and her vital signs have been stable and she is maintaining  normal sinus rhythm.  Her O2 sats have been greater than 90% on room  air.  She has been switched from the Glucommander to her regular home  diabetes medications and is tolerating this with stable blood sugars.  She is ambulating with cardiac rehab phase one and is making good  progress.  She is tolerating a regular diet.  It is felt that if she   continues to progress, she will hopefully be ready for discharge home on  Nov 27, 2007.   DISCHARGE MEDICATIONS:  1. Enteric-coated aspirin 81 mg daily.  2. Toprol-XL 25 mg daily.  3. Lisinopril 2.5 mg daily.  4. Lasix 40 mg daily x 1 week.  5. Potassium 20 mEq daily x 1 week.  6. Ultram 50-100 mg q.4 h. p.r.n. for pain.  7. Metformin 500 mg b.i.d.  8. Zoloft 50 mg daily.   DISCHARGE INSTRUCTIONS:  She is asked to refrain from driving, heavy  lifting, or strenuous activity.  She may continue ambulating daily and  using her incentive spirometry.  She may shower daily and clean her  incisions with soap and water.  She will continue a low-fat, low-sodium,  and carbohydrate modified diet.   DISCHARGE FOLLOWUP:  She is asked to make an appointment with Dr. Daleen Squibb  in 2 weeks.  She will then see Dr. Tyrone Sage in 3 weeks with a chest x-  ray.  If she experiences problems or have questions in the interim, she  is asked to contact our office.      Coral Ceo, P.A.      Sheliah Plane, MD  Electronically Signed    GC/MEDQ  D:  11/26/2007  T:  11/27/2007  Job:  [id number]   cc:   Brett Canales A. [name], M.D.  Thomas C. Wall, MD, [facility]

## 2010-12-04 NOTE — Cardiovascular Report (Signed)
NAMEKENIDY, Lisa James NO.:  [id number]   MEDICAL RECORD NO.:  [id number]          PATIENT TYPE:  INP   LOCATION:  2311                         FACILITY:  [facility]   PHYSICIAN:  Arturo Morton. Riley Kill, MD, FACCDATE OF BIRTH:  1928-06-22   DATE OF PROCEDURE:  11/20/2007  DATE OF DISCHARGE:                            CARDIAC CATHETERIZATION   INDICATIONS:  Lisa James is a 77 year old who presented with shortness  of breath to the emergency room.  She had an abnormal EKG with a  precordial ST depression, and mild inferior ST elevation.  She was seen  by Dr. Daleen Squibb, and referred emergently to the cardiac catheterization  laboratory.   PROCEDURE:  1. Left heart catheterization.  2. Selective coronary angiography.  3. Selective left ventriculography.  4. Subclavian angiography.   DESCRIPTION OF THE PROCEDURE:  The patient was brought to the  catheterization laboratory, prepped and draped in the usual fashion,  through an anterior puncture the femoral artery was entered.  A femoral  sheath was then placed.  Views of the left and right coronaries then  obtained in multiple angiographic projections.  Subclavian angiography  was performed.  Following this, central aortic and left ventricular  pressures were measured and ventriculography was performed in the RAO  projection.  I then asked Dr. Daleen Squibb, and Dr. Tyrone Sage to come to the  catheterization laboratory for conference on the patient's treatment  strategies.  ACT was checked.  Femoral sheath was sewn into place.  Preparations were made for urgent revascularization and possible mitral  valve repair.   HEMODYNAMIC DATA.:  1. Central aortic pressure was 141/68.  2. Left ventricular pressure was 129/26.  3. There was no gradient or pullback across the aortic valve, mean      aortic pressure was 100.   ANGIOGRAPHIC DATA:  1. There is extensive calcification of all of the coronaries.  2. The left main demonstrates a filling  defect in the distal left main      which may partly be due to calcification and narrowing.  This      involves the distal left main and proximal circumflex and LAD.  The      LAD beyond this has a 70% area of segmental severe plaquing and      then there is 90% narrowing after the diagonal.  The diagonal      itself has several small branches both of which have 90% narrowing.      The circumflex itself provides a first marginal with diffuse      diabetic appearance, and then the vessel is totally occluded.      There is a ghost vessel that fills in behind suggesting at least      two marginal branches.  3. The right coronary artery demonstrates 70% stenosis and a      subtotally occluded.  There is 80-90% mid PDA stenosis and also an      80% posterolateral stenosis.  Both vessels appear to be graftable.  4. Ventriculography in the RAO projection reveals inferior hypokinesis      with ejection fraction of 45%.  There is mitral regurgitation that      is thought to be likely severe.  It would be rated at 3-4+.   IMPRESSION:  1. Total atrioventricular circumflex occlusion with acute myocardial      infarction.  2. A 99% RCA stenosis.  3. A 90% distal left main proximal LAD stenosis.  4. Severe mitral regurgitation and decreased LV function.   RECOMMENDATIONS:  Dr. Tyrone Sage and Dr. Daleen Squibb and I have reviewed the  films.  The patient will require urgent revascularization and possible  mitral valve repair.  Because of her age, she is at increased risk.      Arturo Morton. Riley Kill, MD, [facility]  Electronically Signed     TDS/MEDQ  D:  11/20/2007  T:  11/21/2007  Job:  [id number]   cc:   Berenice Bouton, M.D.  [name] [name], M.D.  Thomas C. Daleen Squibb, MD, [facility]  Arturo Morton. Riley Kill, MD, [facility]  Sheliah Plane, MD

## 2010-12-04 NOTE — Discharge Summary (Signed)
Lisa James, Lisa James NO.:  [id number]   MEDICAL RECORD NO.:  [id number]          PATIENT TYPE:  INP   LOCATION:  2027                         FACILITY:  [facility]   PHYSICIAN:  Salvatore Decent. Cornelius James, M.D. DATE OF BIRTH:  10/28/1927   DATE OF ADMISSION:  12/07/2007  DATE OF DISCHARGE:  12/11/2007                               DISCHARGE SUMMARY   ADMITTING DIAGNOSES:  1. Right lower extremity cellulitis.  2. Status post ST-segment-elevation myocardial infarction.  3. Status post emergent coronary artery bypass grafting x5 with Dr.      Tyrone Sage on Nov 20, 2007.  4. History of diabetes mellitus.  5. History of hypertension.  6. History of lymphoma status post chemotherapy.   DISCHARGE DIAGNOSES:  1. Right lower extremity cellulitis.  2. Status post ST-segment elevation myocardial infarction.  3. Status post emergent coronary artery bypass grafting x5 with Dr.      Tyrone Sage, on Nov 20, 2007.  4. History of diabetes mellitus.  5. History of hypertension.  6. History of lymphoma status post chemotherapy.   BRIEF HOSPITAL COURSE STAY:  This is a 77 year old female, patient of  Dr. Juanda Chance, who is status post coronary artery bypass surgery with Dr.  Tyrone Sage on Nov 20, 2007.  The patient had a fairly uneventful  postoperative course and was discharged home in good condition on Nov 27, 2007.  Following her discharge, she did develop increasing dyspnea and  lower extremity edema.  She was seen by Dr. Tyrone Sage on Nov 30, 2007.  At that time, her Lasix was increased to 40 mg p.o. twice daily.  She  was also noted to have a blister on her right forefoot with pitting  edema.  She then returned to the office on Dec 03, 2007 and had  significant improvement of her shortness of breath and lower extremity  edema.  However, the blister on the right forefoot had popped.  She was  a given prescription for Silvadene to apply topically to the open area  of the right forefoot, and diuresis  was continued.  Approximately 2 days  later, however, she developed significant erythema and an open abrasion  of the right forefoot.  Her right forefoot became more swollen and  tender.  She denied any fever or chills.  Denied any shortness of breath  or orthopnea.  She was admitted to [facility] for further wound care and  IV antibiotics related to the right foot cellulitis.  She was placed on  vancomycin IV and ciprofloxacin 500 mg p.o. twice daily.  She also had  Bactroban ointment placed topically on the right forefoot twice daily.  The patient remained afebrile.  While in the hospital, her vital signs  remained stable.  Her right forefoot wound continued to improve.  There  was decreasing erythema and edema daily.  Her white count was normal at  8300.  She was also placed on diuretics as well.  Blood cultures were  drawn and there was no growth.  By Dec 10, 2007, the patient had no  complaints, again she was afebrile.  Her vital signs remained  stable.   PHYSICAL EXAMINATION:  HEART:  Regular rate and rhythm.  LUNGS:  Clear to auscultation bilaterally.  EXTREMITIES:  The right forefoot has decreased erythema again.  There is  a stable area of abrasion, crusting noted, no edema, and she is most  likely going to be discharged home on Dec 11, 2007.   DISCHARGE INSTRUCTIONS:  Include the following:  1. She is to remain on a carbohydrate-modified medium-calorie diet.  2. She is to keep her right leg elevated when not ambulating,      preferably on 2 pillows.  3. She is to apply Bactroban ointment topically to her right foot      twice daily.  4. She is to increase her activity as tolerated.  5. She may shower.   DISCHARGE MEDICATIONS:  Include the following:  1. Lisinopril 2.5 mg p.o. daily.  2. Avelox 400 mg p.o. daily x7 days.  3. Toprol XL 25 mg p.o. daily.  4. ECSA 800 mg p.o. daily.  5. Metformin 500 mg p.o. twice daily.  6. Zoloft 50 mg p.o. daily.   The patient is to  follow up with [facility] on Dec 17, 2007.      Doree Fudge, PA      Lisa James, M.D.  Electronically Signed    DZ/MEDQ  D:  12/10/2007  T:  12/11/2007  Job:  [id number]   cc:   Everardo Beals. Juanda Chance, MD, [facility]

## 2010-12-04 NOTE — Assessment & Plan Note (Signed)
[facility]                            CARDIOLOGY OFFICE NOTE   NAME:TUTTLETimisha, Mondry                    MRN:          [id number]  DATE:06/29/2008                            DOB:          [birth year]    Ms. Limb comes in today for further management of her systolic heart  failure, mitral regurgitation, ischemic heart disease.  Please see my  previous notes for problem list.   Since I saw her on June 01, 2008, her weight has been stable.  She  is opted not to go back to work until after the [employer] which I think  is appropriate.  It is remarkable if she even works at all.   She denies any chest pain, shortness of breath, orthopnea, PND, or  peripheral edema.  She has had no syncope or presyncope.  She denies any  orthostatic symptoms.   Her medications are essentially unchanged since her last visit.  We  thought she was on 25 b.i.d. of Carvedilol, though she is only taking  12.5 b.i.d.   PHYSICAL EXAMINATION:  VITAL SIGNS:  Her blood pressure today is 132/62,  her pulse is 74 and regular, weight is 125 and stable.  HEENT:  Alert and oriented.  SKIN:  Warm and dry.  Rest is unremarkable.  NECK:  No JVD.  Carotids are full without bruits.  LUNGS:  Clear to auscultation and percussion.  HEART:  Soft systolic murmur at the apex, displaced PMI.  No gallop.  ABDOMINAL:  Soft, good bowel sounds.  No pulsatile mass.  No  hepatomegaly.  EXTREMITIES:  There is no sign of clubbing or edema.  Pulses are intact.   Ms. Plourde is doing remarkably well.  We will leave her Carvedilol with  12.5 b.i.d.  I will plan on seeing her back again in about 6 weeks.  She  returned to work in January.     Thomas C. Daleen Squibb, MD, [facility]  Electronically Signed    TCW/MedQ  DD: 06/29/2008  DT: 06/30/2008  Job #: [id number]

## 2010-12-04 NOTE — Assessment & Plan Note (Signed)
OFFICE VISIT   ITZELL, BENDAVID  DOB:  1927/08/17                                        Dec 07, 2007  CHART #:  [id number]   HISTORY OF PRESENT ILLNESS:  Ms. Vanorman is a 77 year old female who  underwent emergency coronary artery bypass grafting x5 by Dr. Tyrone Sage  on Nov 20, 2007.  Her postoperative recovery was relatively  uncomplicated.  Following hospital discharge, she has had problems with  fluid retention with significant dyspnea on exertion, paroxysmal  nocturnal dyspnea, and lower extremity swelling.  Her dose of Lasix was  increased.  She was last seen here in the office 4 days ago by Dr.  Tyrone Sage at which time she was feeling better.  However, she developed a  blister on the dorsum of her right foot.  At that time, there were no  signs of infection.  She was given a prescription for Silvadene cream to  apply to this blistered area on the dorsum of her foot, and no other  changes in her medications were made.  Ms. Oswald returns to the office  today with concerns regarding significant development of redness  involving the right foot.  She states that this really began  approximately 2 days ago and has spread up to foot and now beginning to  spread up the ankle.  The swelling has decreased.  She has not had  fevers.  She does not have any pain.  She reports no shortness of  breath.  She has no chest discomfort.   PHYSICAL EXAMINATION:  VITAL SIGNS:  An elderly female who was afebrile.  CHEST:  Her sternal incision is healing nicely.  LUNGS:  Breath sounds are clear to auscultation.  EXTREMITIES:  Examination of the lower extremities is notable for mild  right lower extremity edema.  There is bright red advancing cellulitis  beginning along the dorsum of the right foot which extends up just past  the ankle.  Small incisions from right thigh and right lower leg  endoscopic vein harvest incision are healing reasonably well with a few  dry  eschars.   IMPRESSION:  Right lower leg and foot cellulitis, status post emergency  coronary artery bypass grafting with endoscopic vein procurement from  the right thigh and right lower leg.  The cellulitis has developed  fairly quickly over the last 2 days and appears impressive on exam.  I  suspect that inpatient admission for intravenous antibiotics is the most  prudent course of management.   PLAN:  We will send Ms. Dayrit to the hospital for admission today.   Salvatore Decent. Cornelius Moras, M.D.  Electronically Signed   CHO/MEDQ  D:  12/07/2007  T:  12/07/2007  Job:  [id number]   cc:   Sheliah Plane, MD  Everardo Beals. Juanda Chance, MD, [facility]

## 2010-12-04 NOTE — Assessment & Plan Note (Signed)
[facility]                            CARDIOLOGY OFFICE NOTE   NAME:Lisa James, Lisa James                    MRN:          [id number]  DATE:05/18/2008                            DOB:          01-18-1928    Ms.  Purington returns today for followup.  Please see my note from May 11, 2008.   Since beginning furosemide 20 mg q.a.m., potassium 20 mEq q.a.m. and  changing her Toprol to Carvedilol 6.25, increasing her lisinopril to 2.5  b.i.d, she has felt better.  She does not weigh herself but she feels  like her edema has resolved.  She denies orthopnea, PND, chest pain, and  her shortness of breath with exertion has improved.   Her 2-D echo has been formally read and is in my last note.  Her EF is  30%.  She has severe mitral regurgitation, increased left and right  sided filling pressures, no pericardial effusion.   She is not sure she is taking lisinopril b.i.d., but think she is.   PHYSICAL EXAMINATION:  VITAL SIGNS:  Today, her blood pressure is  138/80, her pulse is 86 down from 103.  She has a respiratory rate of  18.  GENERAL:  Alert and oriented x3.  She looks comfortable much more so  than last week.  NECK:  No obvious JVD.  Carotids are full.  LUNGS:  Clear to auscultation and percussion without rales.  HEART:  A soft systolic murmur at the apex.  No gallop.  ABDOMEN:  Soft, good bowel sounds.  EXTREMITIES:  No edema today.  Pulses are intact.  NEURO:  Intact.   Ms. Onnen is doing remarkably better.  We will try to bring her heart  rate down a little further by increasing Carvedilol to 25 b.i.d.  In  addition, I am going to increase her lisinopril for after load reduction  to 5 b.i.d.  We will check a Chem-7 today.  I will see her back in 2  weeks.     Thomas C. Daleen Squibb, MD, [facility]  Electronically Signed    TCW/MedQ  DD: 05/18/2008  DT: 05/18/2008  Job #: [id number]

## 2010-12-04 NOTE — Assessment & Plan Note (Signed)
OFFICE VISIT   HOLLIDAY, Lisa James  DOB:  [birth year]                                        Dec 17, 2007  CHART #:  [id number]   Ms. Lisa James is status post emergency coronary artery bypass grafting x5  done by Dr. Tyrone Sage Nov 20, 2007.  The patient's postoperative course  was pretty much unremarkable.  She was discharged home in stable  condition.  She had returned to the office Dec 07, 2007 with complaints  of right lower leg and foot pain with wound and redness.  She was seen  by Dr. Cornelius Moras and required admission to [facility] for IV  antibiotics.  The patient presents today for followup visit of right  foot cellulitis.  The patient states that her right foot cellulitis and  wound is healing.  She has one more dose of the Avelox left.  She has  been using Bactroban as directed.  She states that she has not been  covering her foot.  She denies any drainage, increased redness, fever,  or pain at site.  She is ambulating without difficulty.   PHYSICAL EXAMINATION:  VITAL SIGNS:  Blood pressure 108/68, pulse 96,  respiration of 18, O2 saturation 98% on room air.  RESPIRATORY:  Clear to auscultation bilaterally.  CARDIAC:  Regular rate and rhythm.  EXTREMITIES:  No edema noted in the lower extremities.  She has mild  erythema surrounding healing wound.  Good granulation tissue noted.  No  purulent drainage, cellulitis, or tenderness noted.  Incision is clean,  dry and intact and healing.   IMPRESSION AND PLAN:  The patient is status post emergency coronary  artery bypass grafting seen for followup of her right foot cellulitis.  She was seen and evaluated by Dr. Dorris Fetch.  The patient is to finish  her Avelox dose as directed.  She is to stop the Bactroban ointment.  We  will check a complete blood count and a basic metabolic panel today, as  Dr. Tyrone Sage had wanted prior to her followup visit for a coronary  bypass grafting.  She will see Dr.  Tyrone Sage when he returns from [facility] with an x-ray.  The patient is to continue ambulating  3-4 times per day, and she is to contact us if she develops any fever,  increased redness, swelling, or drainage from her wound.   Salvatore Decent Dorris Fetch, M.D.  Electronically Signed   Lisa James/MEDQ  D:  12/17/2007  T:  12/17/2007  Job:  [id number]   cc:   Salvatore Decent. Dorris Fetch, M.D.  Arturo Morton. Riley Kill, MD, [facility]

## 2010-12-04 NOTE — Assessment & Plan Note (Signed)
OFFICE VISIT   Lisa James, Lisa James  DOB:  03/02/1928                                        Dec 03, 2007  CHART #:  [id number]   The patient returns to the office today in follow-up after her recent  emergency coronary artery bypass grafting for acute myocardial  infarction on Nov 20, 2007. the patient was seen several days ago in the  office because of dyspnea on exertion and paroxysmal nocturnal dyspnea  and pedal edema and blister formation on her right foot and increased  weight.  Her Lasix dose was increased.  She returns now 3 days later for  a follow-up check feeling much better.  She notes her shortness of  breath is much improved and she is able to sleep.   On exam today her blood pressure is 137/77, pulse is 94, respiratory  rate is 18, O2 saturation is 92%.  Her sternum is stable and healing.  She still has pitting edema in both lower extremities, right greater  than left.  The blistering on her foot has now popped and there is a raw  skin area on the dorsum of her right foot.  The dorsum of the right foot  is more swollen than the left.  The vein harvest sites are healing  without obvious infection.   The patient's weight was 142-1/2 on Monday.  Today on Thursday it is 135-  1/2.   I have instructed her to continue to increase her Lasix dose and have  given her a prescription for 40 mg p.o. b.i.d. and potassium 20 mEq p.o.  b.i.d.  Also, she has been given 1% Silvadene cream to apply 2-3 times a  day to the dorsum of her left foot.  We will plan to have her return  with a chest x-ray, CBC, BMET in 1 week to check the status of her edema  and the heel of her foot.   She continues on:  1. Aspirin 81 mg a day.  2. Toprol 25 mg a day.  3. Lisinopril 2.5 mg a day.  4. Lasix 40 mg b.i.d.  5. Potassium 20 mEq b.i.d.  6. Ultram as needed.  7. Metformin 500 mg b.i.d.  8. Zoloft 50 daily.   Sheliah Plane, MD  Electronically Signed   EG/MEDQ   D:  12/03/2007  T:  12/03/2007  Job:  [id number]   cc:   Everardo Beals. Juanda Chance, MD, [facility]

## 2010-12-04 NOTE — Assessment & Plan Note (Signed)
OFFICE VISIT   ALEXX, GIAMBRA  DOB:  10/20/1927                                        January 08, 2008  CHART #:  [id number]   Ms. Korol returns to the office after coronary artery bypass grafting  done as an emergent case on October 21, 2007.  She was subsequently  readmitted with cellulitis of her right foot, probably not associated  with the vein harvesting.  She was treated with IV antibiotics for 4-5  days and ultimately discharged.  She returns to the office today to  check her foot.  Overall, she feels very well and just returned from  being out shopping.  Denies any congestive heart failure symptoms or  recurrent angina.   On examination, blood pressure 120/75, pulse is 80, respiratory rate is  18, O2 sat is 94%.  Sternum is stable and well healed.  Lungs are clear.  The patient's lower extremity edema which had been present when she  developed cellulitis and her foot is now almost completely resolved.  The area of cellulitis in the dorsum of the foot still has some slight  darkening of the skin, but otherwise without evidence of an infection or  cellulitis and as noted with marked decrease in edema.   Chest x-ray was done today, shows interval resolution of bilateral  pleural effusions.   Overall, the patient seems to be making good progress postoperatively.  I have not made a return appointment, but she is aware to be careful  with what shoe she wears to avoid blistering and to contact us to seek  medical attention if she develops any further symptoms of cellulitis in  the foot.   Sheliah Plane, MD  Electronically Signed   EG/MEDQ  D:  01/08/2008  T:  01/09/2008  Job:  [id number]   cc:   Jesse Sans. Wall, MD, [facility]

## 2010-12-04 NOTE — Assessment & Plan Note (Signed)
[facility]                            CARDIOLOGY OFFICE NOTE   NAME:Lisa James, Lisa James                    MRN:          [id number]  DATE:05/11/2008                            DOB:          01-08-1928    Ms. Olejniczak comes in today per the request of Dr. Earl Lites.  She has  had progressive shortness of breath over the last couple of weeks.  Dr.  [name] performed a chest x-ray, which showed small bilateral effusions.  He was concerned about heart failure.   We brought her over today.  She had a 2D echocardiogram, which shows  preliminary an EF of 30%, severe mitral regurgitation, and increased  filling pressures.  There is no pericardial effusion.   Of note, she had an acute inferior lateral wall infarct with severe 3-  vessel disease in May.  She underwent an emergent coronary artery  bypass grafting x5 by Dr. Tyrone Sage. A postoperative TEE showed an  improvement in her severe mitral regurgitation at the time of acute cath  to 1-2+.   Her EF prior to surgery was 45%.   She denies any orthopnea.  She has had some mild peripheral edema.  She  is short of breath with walking.  She has had no chest pain,  tachypalpitations, syncope, or presyncope.   CURRENT MEDICATIONS:  1. Enteric-coated aspirin 81 mg a day.  2. Toprol-XL 25 mg a day.  3. Lisinopril 2.5 mg a day.  4. Metformin 500 b.i.d.  5. Zoloft 50 mg a day.  6. Lipitor 20 mg nightly.   Her CBC at Dr. Ellis Parents office was normal.   PHYSICAL EXAMINATION:  GENERAL:  She is very pleasant.  She is in no  acute distress.  Alert and oriented x3.  SKIN:  Warm and dry.  VITAL SIGNS:  Blood pressure is 136/80, her pulse was 103 and sinus  tach.  EKG shows no acute changes.  HEENT:  Otherwise unremarkable.  NECK:  Carotid upstrokes were equal bilateral without bruits.  She does  have JVD and she has a hepatojugular reflux.  Her liver is 3 fingerbreadths below the right costal margin and is  slightly  tender.  LUNGS:  Clear with no major decreased breath sounds and no rales.  HEART:  Reveals a displaced PMI.  She has a systolic murmur at the apex.  There is no obvious S3 gallop.  S2 splits physiologically.  ABDOMEN:  Soft, good bowel sounds.  No pulsatile mass, no hepatomegaly.  EXTREMITIES:  No cyanosis or clubbing, but she does have trace to 1+  pitting edema.  Pulses are intact.  NEUROLOGIC:  Intact.   ASSESSMENT:  1. Ischemic cardiomyopathy with acute systolic heart failure.  2. Severe mitral regurgitation.  This is probably from infarct      expansion and damage during the infarct itself.   PLAN:  1. Begin furosemide 20 mg q.a.m.  2. Begin potassium 20 mEq q.a.m.  3. Change Toprol-XL to Carvedilol 6.25 b.i.d.  4. Increase lisinopril to 2.5 b.i.d.  5. Follow up with me in a week.     Maisie Fus  Argie Ramming, MD, [facility]  Electronically Signed    TCW/MedQ  DD: 05/11/2008  DT: 05/12/2008  Job #: [id number]   cc:   Brett Canales A. [name], M.D.

## 2010-12-04 NOTE — Assessment & Plan Note (Signed)
[name]                            CARDIOLOGY OFFICE NOTE   NAME:TUTTLETyrah, Lisa James                    MRN:          [id number]  DATE:03/07/2008                            DOB:          1927/11/21    Ms. Lisa James returns today for further management of her coronary artery  disease.  She is now status post a non-ST segment elevated MI, filling  defect in the left main, status post emergent coronary bypass grafting  on Nov 20, 2007.   She is doing remarkably well having now recovered from her right lower  extremity cellulitis.   She is following along with Dr. [name] at [facility].  Her last cholesterol was 106, LDL was 48, HDL was 27, and triglycerides  was 155.  Hemoglobin A1c 5.8.   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  School starts on 03/15/2008.  She has paper  work being for me to fill out today.  She reminds me that her mother is  70 years old.   Her medicines today are aspirin 81 mg a day, Toprol-XL 25 mg a day,  lisinopril 2.5 mg a day, metformin 500 b.i.d., Zoloft 50 mg a day,  Lipitor 20 mg nightly, and potassium 10 mEq a day.   Her blood pressure today is 127/68, her pulse is 80 and regular, weight  is 124 and stable.  HEENT is unchanged.  Carotids are full without  bruits.  No JVD.  Thyroid is not enlarged.  Trachea is midline.  Lungs  are clear.  Heart reveals a regular rate and rhythm.  No gallop.  Abdominal exam is soft.  Extremities reveal no edema and she has a  bruised left ankle and foot from a sprain.  Pulses were present.  She  has some varicose veins.  There is no sign of DVT.  Neuro exam is  intact.   ASSESSMENT/PLAN:  [name] is doing remarkably well.  I have released  her to go back to work.  I will see her back again in January 2010.     Thomas C. Daleen Squibb, MD, [facility]  Electronically Signed    TCW/MedQ  DD: 03/07/2008  DT: 03/08/2008   Job #: [id number]   cc:   Brett Canales A. [name], M.D.

## 2010-12-04 NOTE — H&P (Signed)
NAMECARROL, Lisa James             ACCOUNT NO.:  [id number]   MEDICAL RECORD NO.:  [id number]          PATIENT TYPE:  INP   LOCATION:  2311                         FACILITY:  [facility]   PHYSICIAN:  Jesse Sans. Wall, MD, FACCDATE OF BIRTH:  1927-08-10   DATE OF ADMISSION:  11/20/2007  DATE OF DISCHARGE:                              HISTORY & PHYSICAL   PRIMARY CARDIOLOGIST:  [name], MD, [facility]   PRIMARY CARE PHYSICIAN:  [name]. [name], M.D.   HISTORY OF PRESENT ILLNESS:  This is a 77 year old Caucasian female with  history of hypertension, diabetes, sudden onset of shortness of breath  around 1:00 p.m. today while sitting watching television.  The patient  had been out in the yard earlier pulling up violet bulbs with these  symptoms at that time.  Because of this sudden onset of shortness of  breath and some mild diaphoresis, the patient called EMS.  When they  arrived, they found that the patient was having an inferior ST-elevated  MI with some reciprocal changes.  She was given 4 baby aspirin in the  emergency room and was seen and examined by Dr. [name] and  confirmed ST-elevated MI.  The patient was transferred emergently to  cardiac catheterization lab for catheterization and possible PCI.   REVIEW OF SYSTEMS:  Positive for sudden onset of shortness of breath.  Negative for chest pain, nausea, and vomiting.  Positive for mild  diaphoresis.   PAST MEDICAL HISTORY:  1. Hypertension.  2. Diabetes.   PAST SURGICAL HISTORY:  Hernia repair.   SOCIAL:  She lives in [place] in [place], alone.  She is  retired.  She is a widow.  She does not smoke, does not drink alcohol.   FAMILY HISTORY:  Mother is alive at age 62 and is in good health.  Father died of an MI, who also had a history of diabetes.  Siblings, the  patient has one brother and one sister, both of which are in good  health.   CURRENT MEDICATIONS AT HOME:  1. Metformin 500 mg twice a day.  2.  Norvasc 10 mg daily.  3. Zoloft 50 mg daily.  4. Klor-Con 10 mEq daily.   ALLERGIES:  No known drug allergies.   PHYSICAL EXAMINATION:  VITAL SIGNS:  Blood pressure 139/83, pulse 90,  respirations 20, O2 sat 93% on room air, and weight 125 pounds.  GENERAL:  She is awake, alert, and oriented.  Breathing status is  stabilized.  HEENT:  Head is normocephalic and atraumatic.  Eyes, PERRLA.  Mucous  membranes, mouth pink and moist.  Tongue is midline.  NECK:  Supple.  There is no JVD.  No carotid bruits appreciated.  CARDIOVASCULAR:  Regular rate and rhythm without murmurs, rubs, or  gallops.  Pulses are 2+ and equal bilaterally without bruits noted.  LUNGS:  Clear to auscultation.  ABDOMEN:  Soft, nontender, 2+ bowel sounds.  Extremities:  Without clubbing, cyanosis, or edema.  NEURO:  Cranial nerves II through XII are grossly intact.   Labs are stat and pending.  The patient is currently on  cardiac  catheterization table as dictation is being done.   IMPRESSION:  1. ST-elevated myocardial infarction.  A:  Symptoms of sudden onset of shortness of breath with EKG changes  with ST elevation in lead II, III, aVL with reciprocal changes  anteriorly.  1. Hypertension.  2. Diabetes.   PLAN:  The patient has been seen and examined in the emergency room by  Dr. [name] and confirmed ST-elevated MI.  The patient was given 4  baby aspirin and transferred emergently to the cardiac catheterization  lab, where she is being attended by Dr. [name].  We will admit  the patient and make following followup recommendations and treatment  based upon her response and hospital course.      Bettey Mare. Lyman Bishop, NP      Jesse Sans. Daleen Squibb, MD, [facility]  Electronically Signed    KML/MEDQ  D:  11/20/2007  T:  11/21/2007  Job:  [id number]   cc:   Brett Canales A. [name], M.D.

## 2010-12-04 NOTE — Assessment & Plan Note (Signed)
OFFICE VISIT   MANON, BANBURY  DOB:  08/23/1927                                        Nov 30, 2007  CHART #:  [id number]   Ms. July 10 days ago underwent emergency coronary artery bypass  grafting for acute myocardial infarction. She was discharged home two  days ago but came to the office today because she noted increasing  swelling in both feet and some shortness of breath when she lays down  and trouble sleeping.  She was originally discharged home on Lasix 40 mg  a day but was only taking 20 mg once a day.   PHYSICAL EXAMINATION:  VITALS:  On exam today her blood pressure 154/89,  pulse is 94, respiratory rate 18, O2 sats 93%.  She weighs 144 1/2  pounds. She notes her usual weight is 124.  RESPIRATORY:  She has decreased breath sounds at the base. Her sternum  is stable and well-healed.  EXTREMITIES:  She has 2+ pitting edema in both lower extremities in the  calves and at the ankle.   Follow-up chest x-ray shows moderate bilateral pleural effusions.   IMPRESSION:  The patient with postoperative fluid retention and  congestive heart failure.  At rest she is comfortable and in no distress  and will increase her diuretics to 40 mg p.o. b.i.d. for today, tomorrow  and Thursday.  I plan on seeing her back in the office in 3 days to  reevaluate her, check her weight and adjust Lasix dose accordingly.   Sheliah Plane, MD  Electronically Signed   EG/MEDQ  D:  11/30/2007  T:  11/30/2007  Job:  [id number]   cc:   Arturo Morton. Riley Kill, MD, [facility]

## 2010-12-04 NOTE — Consult Note (Signed)
Lisa James, SIEBEL NO.:  [id number]   MEDICAL RECORD NO.:  [id number]          PATIENT TYPE:  INP   LOCATION:  2311                         FACILITY:  [facility]   PHYSICIAN:  Sheliah Plane, MD    DATE OF BIRTH:  1928/05/04   DATE OF CONSULTATION:  11/20/2007  DATE OF DISCHARGE:                                 CONSULTATION   CARDIOLOGIST:  Jesse Sans. Daleen Squibb, MD, [facility]   PRIMARY CARE PHYSICIAN:  [name]. Daub, M.D.   CHIEF COMPLAINT:  Acute myocardial infarction, inferolateral.   HISTORY OF PRESENT ILLNESS:  The patient is a 77 year old female who has  had 3 weeks' history of increasing dyspnea on exertion and fatigue.  This afternoon, she had sudden onset of shortness of breath around 1  p.m. while watching TV.  Earlier in the day, she had been in her yard  pulling up weeds, was brought in by EMS.  Code STEMI was called and the  patient was given 4 baby aspirin and brought to the cath lab.   PAST MEDICAL HISTORY:  She has a previous history of diabetes.   SOCIAL HISTORY:  She is a nonsmoker.   PAST SURGICAL HISTORY:  Previous surgery includes hiatal hernia.   SOCIAL AND FAMILY HISTORY:  The patient's mother is alive at 57.  Father died of myocardial infarction.  She has 1 brother and 1 sister in  good health.   CURRENT MEDICATIONS:  1. Metformin 500 mg twice a day.  2. Norvasc 10 mg a day.  3. Zoloft 50 mg a day.  4. Potassium 10 mEq a day.   REVIEW OF SYSTEMS:  As noted above sudden onset of dyspnea on exertion  and shortness of breath.  She has had some symptoms of fatigue.  Denies  any amaurosis or TIAs.  Denies previous stroke.  Denies change in bowel  habits.  Denies blood in her stool or urine.   PHYSICAL EXAMINATION:  GENERAL:  The patient is in the cath lab, awake  and neurologically intact.  VITAL SIGNS:  Blood pressure 130/80, respiratory rate is 20, sinus  rhythm at 90, and estimated weight 125 pounds.  NECK:  The patient has no carotid  bruits.  CARDIAC:  I did not appreciate any murmur of mitral insufficiency,  though in the setting of exams, difficult to get good exam.  ABDOMEN:  Benign without palpable masses.  EXTREMITIES:  She has decreased pulses in both feet, both feet appear  viable.   LABORATORY FINDINGS:  Pending.   Cardiac catheterization films were reviewed with Dr. Daleen Squibb and Dr.  Riley Kill.  The patient has probably 2+ MR, though on the ventriculogram  it is difficult to quantitate.  There is some inferior hypokinesis.  She  has 80-90% distal left main total occlusion of the circumflex.  A small  first obtuse marginal comes off the right coronary artery.  The LAD is  diffusely diseased distally with multiple 90% stenosis even very distal  in the vessel.  The right coronary artery has large vessel with a  proximal 99% stenosis, mid posterior descending 90% with a  long  posterior lateral branch.   IMPRESSION:  Acute myocardial infarction with critical 3-vessel anatomy  and at least moderate mitral regurgitation, difficult to evaluate  quantitatively.   PLAN:  Emergency coronary artery bypass graft and replacement of a TEE.  Dr. Riley Kill notes difficulty in gaining vascular access to the lower  extremities to do the cath, which will make entering of balloon pump  difficult.  The increased risk of procedure discussed with the patient  and her son.  At the time of surgery, we will evaluate the mitral valve  with TEE and decide if intervention on the mitral valve was also  indicated.      Sheliah Plane, MD  Electronically Signed     EG/MEDQ  D:  11/20/2007  T:  11/21/2007  Job:  [id number]   cc:   Arturo Morton. Riley Kill, MD, [facility]

## 2010-12-04 NOTE — Assessment & Plan Note (Signed)
[facility]                            CARDIOLOGY OFFICE NOTE   NAME:Lisa James, Lisa James                    MRN:          [id number]  DATE:01/01/2008                            DOB:          1927/10/18    HISTORY:  Ms. Lisa James returns today after discharged from the hospital.  She had an acute inferior ST-segment-elevated MI while working in her  flower garden on Nov 20, 2007.  She was brought emergently by 911 to the  cath lab.  Her cath showed severe 3-vessel disease including a 90% left  main.  She had 3-4+ mitral regurgitation.   She has had a history of mild aortic stenosis by echo in the past.  Her  gradient in the cath lab was 13 systolic.   She has had some postoperative cellulitis of her right lower extremity.  She had been seen back in the TTS Office and given antibiotics as well  as Lasix.   Dr. Andee Lineman saw her postop and saw the emergent cath report and suggested  a followup echo.   She is totally asymptomatic at present except for chest wall soreness.  Her cellulitis is resolved.  She is anxious to get back into her flower  garden.   MEDICATIONS:  1. Aspirin 81 mg a day.  2. Toprol-XL 25 mg a day.  3. Lisinopril 12.5 mg a day.  4. Metformin 500 mg p.o. b.i.d.  5. Zoloft 50 mg a day.  6. Lipitor 20 mg nightly.  7. Potassium 10 mEq a day.   PHYSICAL EXAMINATION:  She is  a very spunky, vivacious, humorous lady  in no acute distress.  Her blood pressure is 132/76, her pulse is 66 and regular, and weight is  124.  HEENT: Other than being slightly pale is within normal limits.  Carotid  upstrokes were equal bilaterally without bruits, no JVD.  Thyroid is not  enlarged.  Trachea is midline.  NECK:  Supple.  LUNGS:  Clear to auscultation.  HEART:  Reveals a regular rate and rhythm.  She has a systolic murmur at  the apex.  This is not really that loud, maybe 2/6.  She has a systolic  murmur along the left sternal border with S2  splitting physiologically.  ABDOMEN:  Soft.  Good bowel sounds.  EXTREMITIES:  No cyanosis, clubbing, or edema.  Pulses are intact.  NEURO:  Intact.  There is no evidence of cellulitis on exam today.   Electrocardiogram shows an evolving inferior wall infarct with poor  progression across the anterior precordium, including all the way out to  V6.  I do not have an old EKG to compare.   ASSESSMENT AND PLAN:  Ms. Lisa James is recovering nicely.  Her cellulitis  is resolved.  I stopped her potassium.  We will continue with her other  medications.  She has been told she can drive locally and do light work  in her garden, which is her passion.  No heavy lifting or heavy garden  work until February 20, 2008.  I will see her back at that time to release  her.  Thomas C. Daleen Squibb, MD, [facility]  Electronically Signed    TCW/MedQ  DD: 01/01/2008  DT: 01/02/2008  Job #: [id number]

## 2010-12-07 NOTE — Op Note (Signed)
Lisa James, Lisa James                       ACCOUNT NO.:  [id number]   MEDICAL RECORD NO.:  [id number]                   PATIENT TYPE:  AMB   LOCATION:  DSC                                  FACILITY:  [facility]   PHYSICIAN:  Lucky Cowboy, M.D.                    DATE OF BIRTH:  25-Aug-1927   DATE OF PROCEDURE:  10/22/2002  DATE OF DISCHARGE:                                 OPERATIVE REPORT   PREOPERATIVE DIAGNOSIS:  Submental lymphadenopathy.   POSTOPERATIVE DIAGNOSIS:  Submental lymphadenopathy.   PROCEDURE:  Excisional biopsy, submental lymph node.   SURGEON:  Lucky Cowboy, M.D.   ANESTHESIA:  General endotracheal anesthesia.   ESTIMATED BLOOD LOSS:  None.   COMPLICATIONS:  None.   INDICATIONS:  This patient is a 77 year old female who has had some swelling  in the left submandibular and submental triangle.  This has been present for  approximately six weeks.  A CT was performed, which revealed multiple  submental lymph nodes.  Fine needle aspiration was performed, which revealed  mixed lymphoid population with scattered large cells.  For these reasons,  excisional biopsy is performed.   FINDINGS:  The patient was noted to have a 2 cm submental mass with other  palpable adenopathy in the region.   DESCRIPTION OF PROCEDURE:  The patient was taken to the operating room and  placed on the table in the supine position.  She was then placed under  general endotracheal anesthesia and the table rotated clockwise 90 degrees.  The patient was administered 1 g of Kefzol.  The neck was extended and  prepped with Betadine and draped in the usual sterile fashion.  A transverse  submental skin incision was made approximately 3 cm inferior to the mentum.  A transverse incision was made using a #15 blade with a total length of  approximately 4 cm.  Subcutaneous tissues were divided using Bovie cautery.  The platysma was divided using Bovie cautery and the lymph node was readily  identified.   It was dissected using Bovie cautery, staying on the capsule,  and also an alligator hemostat.  The wound was copiously irrigated.  The  lymph node was sent  fresh in saline for lymphoma protocol.  The wound was  irrigated and the platysma reapproximated in a simple interrupted buried  fashion using 4-  0 Vicryl.  The skin was closed in a running stitch using 4-0 Prolene.  Bacitracin ointment was applied.  The table was rotated clockwise 90 degrees  to its original position.  The patient was awakened from anesthesia and  taken to the postanesthesia care unit in stable condition.  There were no  complications.  Lucky Cowboy, M.D.    SJ/MEDQ  D:  10/22/2002  T:  10/23/2002  Job:  [id number]   cc:   Brett Canales A. [name], M.D.  [address]  [place]  [place] [address]  Fax: [phone]

## 2010-12-07 NOTE — Op Note (Signed)
   NAMEELSBETH, YEARICK                       ACCOUNT NO.:  [id number]   MEDICAL RECORD NO.:  [id number]                   PATIENT TYPE:  OUT   LOCATION:  OMED                                 FACILITY:  [facility]   PHYSICIAN:  Rose Phi. Myna Hidalgo, M.D.              DATE OF BIRTH:  04-May-1928   DATE OF PROCEDURE:  11/22/2002  DATE OF DISCHARGE:                                 OPERATIVE REPORT   PROCEDURE:  Left posterior iliac crest bone marrow biopsy and aspirate.   DESCRIPTION OF PROCEDURE:  Ms. Struve was brought to the short stay unit.  She had an IV placed. She was then placed onto her right side.   2 mg of Versed and 25 mg of Demerol were given IV for sedation.   The posterior iliac crest region was prepped and draped in a sterile  fashion. 10 mL of lidocaine was infiltrated under the skin down to the  periosteum.   A #11 scalpel was used to make an incision into the skin. Two bon marrow  aspirates were obtained without difficulties.   A second incision was made into the skin with a #11 scalpel. A bone marrow  coil was obtained without difficulty.   There were no complications. There was no bleeding. The patient tolerated  the procedure well.                                               Rose Phi. Myna Hidalgo, M.D.    PRE/MEDQ  D:  11/22/2002  T:  11/22/2002  Job:  [id number]

## 2011-01-10 ENCOUNTER — Other Ambulatory Visit: Payer: Self-pay | Admitting: Surgery

## 2011-01-30 ENCOUNTER — Ambulatory Visit (INDEPENDENT_AMBULATORY_CARE_PROVIDER_SITE_OTHER): Payer: BC Managed Care – PPO | Admitting: Cardiology

## 2011-01-30 ENCOUNTER — Encounter: Payer: Self-pay | Admitting: Cardiology

## 2011-01-30 VITALS — BP 170/80 | HR 65 | Resp 12 | Ht 60.0 in | Wt 128.0 lb

## 2011-01-30 DIAGNOSIS — I5022 Chronic systolic (congestive) heart failure: Secondary | ICD-10-CM

## 2011-01-30 DIAGNOSIS — I259 Chronic ischemic heart disease, unspecified: Secondary | ICD-10-CM

## 2011-01-30 DIAGNOSIS — I10 Essential (primary) hypertension: Secondary | ICD-10-CM

## 2011-01-30 DIAGNOSIS — I080 Rheumatic disorders of both mitral and aortic valves: Secondary | ICD-10-CM

## 2011-01-30 DIAGNOSIS — I6529 Occlusion and stenosis of unspecified carotid artery: Secondary | ICD-10-CM

## 2011-01-30 DIAGNOSIS — I252 Old myocardial infarction: Secondary | ICD-10-CM

## 2011-01-30 MED ORDER — SPIRONOLACTONE 25 MG PO TABS: 25.0000 mg | ORAL_TABLET | Freq: Every day | ORAL | Status: DC

## 2011-01-30 NOTE — Assessment & Plan Note (Signed)
Stable. Her hypertension, I will add spironolactone 25 mg a day with a followup metabolic profile in a week.

## 2011-01-30 NOTE — Assessment & Plan Note (Signed)
Stable. No symptoms of TIAs or threatened stroke. Continue medical therapy.

## 2011-01-30 NOTE — Progress Notes (Signed)
HPI Lisa James returns today for work they wish to management of her ischemic heart disease, chronic systolic heart failure, mitral regurgitation, hypertension.  She denies any chest pain or ischemic symptoms. She sleeps on 2 pillows which is been stable. She denies orthopnea, PND, or edema.  She's very compliant with her medications.  Her blood pressures up to date and always seems to be up in the office. She says is much better when he checks it at a department store.  Her EKG today shows normal sinus rhythm with left ventricular hypertrophy and repolarization abnormalities. Past Medical History  Diagnosis Date  . Acute MI, lateral Malone Vanblarcom   . Chronic systolic heart failure   . Ischemic heart disease   . Mitral regurgitation     Past Surgical History  Procedure Date  . Coronary artery bypass graft 11/20/2007    x5  . Bone marrow biopsy 11/22/2002    left posterior iliac crest bone marrow biopsy and aspirate  . Submental lymph node excisional biopsy 10/22/2002    Family History  Problem Relation Age of Onset  . Heart attack Father   . Diabetes Father     History   Social History  . Marital Status: Widowed    Spouse Name: N/A    Number of Children: N/A  . Years of Education: N/A   Occupational History  . Not on file.   Social History Main Topics  . Smoking status: Never Smoker   . Smokeless tobacco: Not on file  . Alcohol Use: No  . Drug Use: No  . Sexually Active:    Other Topics Concern  . Not on file   Social History Narrative  . No narrative on file    No Known Allergies  Current Outpatient Prescriptions  Medication Sig Dispense Refill  . amLODipine (NORVASC) 2.5 MG tablet TAKE 1 TABLET BY MOUTH ONCE A DAY  30 tablet  10  . aspirin 81 MG tablet Take 81 mg by mouth daily.        Marland Kitchen atorvastatin (LIPITOR) 20 MG tablet Take 20 mg by mouth daily.        . calcium carbonate (TUMS - DOSED IN MG ELEMENTAL CALCIUM) 500 MG chewable tablet Chew 1 tablet by mouth as  needed.        . carvedilol (COREG) 6.25 MG tablet Take 6.25 mg by mouth 2 (two) times daily with a meal.        . furosemide (LASIX) 20 MG tablet Take 20 mg by mouth daily.        Marland Kitchen losartan (COZAAR) 100 MG tablet Take 100 mg by mouth daily.        . metFORMIN (GLUCOPHAGE) 500 MG tablet Take 500 mg by mouth 2 (two) times daily with a meal.        . potassium chloride SA (K-DUR,KLOR-CON) 20 MEQ tablet Take 20 mEq by mouth daily.        . sertraline (ZOLOFT) 50 MG tablet Take 50 mg by mouth daily.        . vitamin E 400 UNIT capsule Take 400 Units by mouth daily.          ROS Negative other than HPI.   PE General Appearance: well developed, well nourished in no acute distress HEENT: symmetrical face, PERRLA, good dentition  Neck: no JVD, thyromegaly, or adenopathy, trachea midline Chest: symmetric without deformity Cardiac: PMI displaced RRR, normal S1, S2, no gallop, soft systolic murmur at the apex Lung: clear to  ausculation and percussion Vascular: all pulses full without bruits  Abdominal: nondistended, nontender, good bowel sounds, no HSM, no bruits Extremities: no cyanosis, clubbing or edema, no sign of DVT, no varicosities  Skin: normal color, no rashes Neuro: alert and oriented x 3, non-focal Pysch: normal affect Filed Vitals:   01/30/11 1451  BP: 170/80  Pulse: 65  Resp: 12  Height: 5' (1.524 m)  Weight: 128 lb (58.06 kg)    EKG  Labs and Studies Reviewed.   No results found for this basename: WBC, HGB, HCT, MCV, PLT      Chemistry      Component Value Date/Time   NA 140 11/23/2009 1005   K 4.2 11/23/2009 1005   CL 101 11/23/2009 1005   CO2 31 11/23/2009 1005   BUN 20 11/23/2009 1005   CREATININE 0.9 11/23/2009 1005      Component Value Date/Time   CALCIUM 9.6 11/23/2009 1005   ALKPHOS 85 06/20/2009 0000   AST 29 06/20/2009 0000   ALT 19 06/20/2009 0000   BILITOT 1.3* 06/20/2009 0000       Lab Results  Component Value Date   CHOL 117 06/20/2009   Lab  Results  Component Value Date   HDL 28.00* 06/20/2009   No results found for this basename: LDLCALC   Lab Results  Component Value Date   TRIG 209.0* 06/20/2009   Lab Results  Component Value Date   CHOLHDL 4 06/20/2009   No results found for this basename: HGBA1C   Lab Results  Component Value Date   ALT 19 06/20/2009   AST 29 06/20/2009   ALKPHOS 85 06/20/2009   BILITOT 1.3* 06/20/2009   No results found for this basename: TSH

## 2011-01-30 NOTE — Assessment & Plan Note (Signed)
Stable by exam. Continue medical therapy.

## 2011-01-30 NOTE — Assessment & Plan Note (Signed)
Elevated. Salt restriction emphasized, medication compliance, and I've added spironolactone 25 mg a day.

## 2011-01-30 NOTE — Patient Instructions (Signed)
Your physician recommends that you schedule a follow-up appointment in: 6 months with Dr. Daleen Squibb  Your physician has recommended you make the following change in your medication : START: Spironolactone  Your physician recommends that you return for lab work in: Thursday July 19,2012 for a bmet

## 2011-02-06 ENCOUNTER — Other Ambulatory Visit (INDEPENDENT_AMBULATORY_CARE_PROVIDER_SITE_OTHER): Payer: BC Managed Care – PPO | Admitting: *Deleted

## 2011-02-06 DIAGNOSIS — I5022 Chronic systolic (congestive) heart failure: Secondary | ICD-10-CM

## 2011-02-06 LAB — BASIC METABOLIC PANEL
BUN: 19 mg/dL (ref 6–23)
Calcium: 9.4 mg/dL (ref 8.4–10.5)
GFR: 51.71 mL/min — ABNORMAL LOW (ref >60.00)
Glucose, Bld: 150 mg/dL — ABNORMAL HIGH (ref 70–99)
Potassium: 4.5 mEq/L (ref 3.5–5.1)

## 2011-02-07 ENCOUNTER — Other Ambulatory Visit: Payer: BC Managed Care – PPO | Admitting: *Deleted

## 2011-02-12 ENCOUNTER — Telehealth: Payer: Self-pay | Admitting: *Deleted

## 2011-02-12 NOTE — Telephone Encounter (Signed)
Pt aware of lab results.   Copy sent to Dr. Margie Billet RN

## 2011-02-12 NOTE — Telephone Encounter (Signed)
Message copied by Theda Belfast on Tue Feb 12, 2011 10:45 AM ------      Message from: [name] C      Created: Sun Feb 10, 2011  4:05 PM       OK except blood sugar. Send to her primary care for review...may need to increase Metformin

## 2011-02-15 ENCOUNTER — Encounter: Payer: Self-pay | Admitting: Cardiology

## 2011-04-17 LAB — CBC
MCV: 98.8
RBC: 3.89
WBC: 8.3

## 2011-04-17 LAB — COMPREHENSIVE METABOLIC PANEL
ALT: 24
AST: 24
Alkaline Phosphatase: 98
CO2: 30
Chloride: 96
Creatinine, Ser: 1.1
GFR calc Af Amer: 58 — ABNORMAL LOW
GFR calc non Af Amer: 48 — ABNORMAL LOW
Potassium: 4.3
Total Bilirubin: 1.8 — ABNORMAL HIGH

## 2011-04-17 LAB — CULTURE, BLOOD (ROUTINE X 2): Culture: NO GROWTH

## 2011-05-11 ENCOUNTER — Emergency Department (HOSPITAL_COMMUNITY): Payer: BC Managed Care – PPO

## 2011-05-11 ENCOUNTER — Inpatient Hospital Stay (HOSPITAL_COMMUNITY)
Admission: EM | Admit: 2011-05-11 | Discharge: 2011-05-16 | DRG: 569 | Disposition: A | Discharge status: Home Health | Payer: BC Managed Care – PPO | Source: Ambulatory Visit | Attending: Family Medicine | Admitting: Family Medicine

## 2011-05-11 DIAGNOSIS — C8589 Other specified types of non-Hodgkin lymphoma, extranodal and solid organ sites: Secondary | ICD-10-CM | POA: Diagnosis present

## 2011-05-11 DIAGNOSIS — E785 Hyperlipidemia, unspecified: Secondary | ICD-10-CM | POA: Diagnosis present

## 2011-05-11 DIAGNOSIS — R410 Disorientation, unspecified: Secondary | ICD-10-CM | POA: Diagnosis present

## 2011-05-11 DIAGNOSIS — D696 Thrombocytopenia, unspecified: Secondary | ICD-10-CM | POA: Diagnosis present

## 2011-05-11 DIAGNOSIS — I059 Rheumatic mitral valve disease, unspecified: Secondary | ICD-10-CM | POA: Diagnosis present

## 2011-05-11 DIAGNOSIS — I509 Heart failure, unspecified: Secondary | ICD-10-CM | POA: Diagnosis present

## 2011-05-11 DIAGNOSIS — E119 Type 2 diabetes mellitus without complications: Secondary | ICD-10-CM | POA: Diagnosis present

## 2011-05-11 DIAGNOSIS — A498 Other bacterial infections of unspecified site: Secondary | ICD-10-CM | POA: Diagnosis present

## 2011-05-11 DIAGNOSIS — I252 Old myocardial infarction: Secondary | ICD-10-CM

## 2011-05-11 DIAGNOSIS — E871 Hypo-osmolality and hyponatremia: Secondary | ICD-10-CM | POA: Diagnosis present

## 2011-05-11 DIAGNOSIS — N390 Urinary tract infection, site not specified: Principal | ICD-10-CM | POA: Diagnosis present

## 2011-05-11 DIAGNOSIS — Z8249 Family history of ischemic heart disease and other diseases of the circulatory system: Secondary | ICD-10-CM

## 2011-05-11 DIAGNOSIS — I10 Essential (primary) hypertension: Secondary | ICD-10-CM | POA: Diagnosis present

## 2011-05-11 DIAGNOSIS — I6200 Nontraumatic subdural hemorrhage, unspecified: Secondary | ICD-10-CM | POA: Diagnosis present

## 2011-05-11 DIAGNOSIS — Z79899 Other long term (current) drug therapy: Secondary | ICD-10-CM

## 2011-05-11 DIAGNOSIS — Z951 Presence of aortocoronary bypass graft: Secondary | ICD-10-CM

## 2011-05-11 DIAGNOSIS — Z833 Family history of diabetes mellitus: Secondary | ICD-10-CM

## 2011-05-11 DIAGNOSIS — Z7982 Long term (current) use of aspirin: Secondary | ICD-10-CM

## 2011-05-11 DIAGNOSIS — I2510 Atherosclerotic heart disease of native coronary artery without angina pectoris: Secondary | ICD-10-CM | POA: Diagnosis present

## 2011-05-11 DIAGNOSIS — S065X9A Traumatic subdural hemorrhage with loss of consciousness of unspecified duration, initial encounter: Secondary | ICD-10-CM | POA: Diagnosis present

## 2011-05-11 DIAGNOSIS — E876 Hypokalemia: Secondary | ICD-10-CM | POA: Diagnosis present

## 2011-05-11 DIAGNOSIS — I5022 Chronic systolic (congestive) heart failure: Secondary | ICD-10-CM | POA: Diagnosis present

## 2011-05-11 DIAGNOSIS — I2589 Other forms of chronic ischemic heart disease: Secondary | ICD-10-CM | POA: Diagnosis present

## 2011-05-11 HISTORY — DX: Essential (primary) hypertension: I10

## 2011-05-11 HISTORY — DX: Non-Hodgkin lymphoma, unspecified, unspecified site: C85.90

## 2011-05-11 HISTORY — DX: Type 2 diabetes mellitus without complications: E11.9

## 2011-05-11 LAB — CBC
Hemoglobin: 11.8 g/dL — ABNORMAL LOW (ref 12.0–15.0)
MCH: 33.1 pg (ref 26.0–34.0)
MCV: 94.1 fL (ref 78.0–100.0)
RBC: 3.57 MIL/uL — ABNORMAL LOW (ref 3.87–5.11)

## 2011-05-11 LAB — BASIC METABOLIC PANEL
BUN: 21 mg/dL (ref 6–23)
CO2: 22 mEq/L (ref 19–32)
Calcium: 9.4 mg/dL (ref 8.4–10.5)
Creatinine, Ser: 1.06 mg/dL (ref 0.50–1.10)
Glucose, Bld: 112 mg/dL — ABNORMAL HIGH (ref 70–99)

## 2011-05-11 LAB — DIFFERENTIAL
Lymphs Abs: 1.5 10*3/uL (ref 0.7–4.0)
Monocytes Relative: 10 % (ref 3–12)
Neutro Abs: 4.7 10*3/uL (ref 1.7–7.7)
Neutrophils Relative %: 66 % (ref 43–77)

## 2011-05-12 ENCOUNTER — Observation Stay (HOSPITAL_COMMUNITY): Payer: BC Managed Care – PPO

## 2011-05-12 ENCOUNTER — Encounter (HOSPITAL_COMMUNITY): Payer: Self-pay | Admitting: Family Medicine

## 2011-05-12 DIAGNOSIS — F05 Delirium due to known physiological condition: Secondary | ICD-10-CM

## 2011-05-12 DIAGNOSIS — S065X9A Traumatic subdural hemorrhage with loss of consciousness of unspecified duration, initial encounter: Secondary | ICD-10-CM

## 2011-05-12 DIAGNOSIS — D6949 Other primary thrombocytopenia: Secondary | ICD-10-CM

## 2011-05-12 DIAGNOSIS — R410 Disorientation, unspecified: Secondary | ICD-10-CM | POA: Diagnosis present

## 2011-05-12 LAB — COMPREHENSIVE METABOLIC PANEL
ALT: 13 U/L (ref 0–35)
AST: 21 U/L (ref 0–37)
Albumin: 3.4 g/dL — ABNORMAL LOW (ref 3.5–5.2)
Alkaline Phosphatase: 72 U/L (ref 39–117)
Glucose, Bld: 173 mg/dL — ABNORMAL HIGH (ref 70–99)
Potassium: 4.3 mEq/L (ref 3.5–5.1)
Sodium: 139 mEq/L (ref 135–145)
Total Protein: 6 g/dL (ref 6.0–8.3)

## 2011-05-12 LAB — PROTIME-INR: INR: 1.07 (ref 0.00–1.49)

## 2011-05-12 LAB — TROPONIN I: Troponin I: <0.3 ng/mL (ref <0.30)

## 2011-05-12 LAB — URINALYSIS, ROUTINE W REFLEX MICROSCOPIC
Glucose, UA: NEGATIVE mg/dL
Specific Gravity, Urine: 1.005 (ref 1.005–1.030)
Urobilinogen, UA: 0.2 mg/dL (ref 0.0–1.0)

## 2011-05-12 LAB — URINE MICROSCOPIC-ADD ON

## 2011-05-12 LAB — CBC
HCT: 33.7 % — ABNORMAL LOW (ref 36.0–46.0)
Platelets: 50 10*3/uL — ABNORMAL LOW (ref 150–400)
RDW: 12.9 % (ref 11.5–15.5)
WBC: 4.9 10*3/uL (ref 4.0–10.5)

## 2011-05-12 LAB — GLUCOSE, CAPILLARY
Glucose-Capillary: 100 mg/dL — ABNORMAL HIGH (ref 70–99)
Glucose-Capillary: 109 mg/dL — ABNORMAL HIGH (ref 70–99)

## 2011-05-12 LAB — RAPID URINE DRUG SCREEN, HOSP PERFORMED: Amphetamines: NOT DETECTED

## 2011-05-12 LAB — MRSA PCR SCREENING: MRSA by PCR: NEGATIVE

## 2011-05-12 LAB — PLATELET FUNCTION ASSAY

## 2011-05-12 NOTE — H&P (Signed)
Family Medicine Teaching [facility] Admission History and Physical  Patient name: Lisa James Medical record number: [id number] Date of birth: 06-15-1928 Age: 77 y.o. Gender: female  Primary Care Provider: Lucilla Edin, MD  Chief Complaint: Confusion History of Present Illness: Lisa James is a 77 y.o. year old female presenting with an episode of confusion yesterday morning.  Pt does not remember much of yesterday clearly but somehow managed to drive to [place], [place] without remembering.  Pt does have a fall on a bus about 1.5 weeks ago and did hit head.  Was seen be PCP and sent home with symptomatic treatment. Apparently at that time she failed to mention that she hit her head and no imaging of her head was performed. Since that time family members have noticed that she has been acting a little bit strange. The patient does not really remember much of today, but was found in [place] having driven her car until it ran out of gas.  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  She has not been working since her fall and has very little contact with family members. Prior to this incident, the patient had been relatively functional and had been living alone without problems.  Pt does not know a list of her medications. She reports that they have not been changed since her last appointment with her cardiologist. She was supposed to see her primary care physician today for a follow up, but for obvious reasons missed this appointment.  Patient Active Problem List  Diagnoses  . HYPERLIPIDEMIA TYPE IIB / III  . MITRAL REGURGITATION  . HYPERTENSION, BENIGN  . OLD MYOCARDIAL INFARCTION  . ISCHEMIC HEART DISEASE  . SYSTOLIC HEART FAILURE, CHRONIC  . Occlusion and stenosis of carotid artery without mention of cerebral infarction  . Confusion  . Subdural hematoma, acute   Past Medical History: Past Medical History  Diagnosis Date  . Acute MI, lateral wall   .  Chronic systolic heart failure   . Ischemic heart disease   . Mitral regurgitation   . HTN (hypertension)   . DM (diabetes mellitus)   . Non Hodgkin's lymphoma     of the throat    Past Surgical History: Past Surgical History  Procedure Date  . Coronary artery bypass graft 11/20/2007    x5  . Bone marrow biopsy 11/22/2002    left posterior iliac crest bone marrow biopsy and aspirate  . Submental lymph node excisional biopsy 10/22/2002    Social History: History   Social History  . Marital Status: Widowed    Spouse Name: N/A    Number of Children: N/A  . Years of Education: N/A   Social History Main Topics  . Smoking status: Never Smoker   . Smokeless tobacco: Not on file  . Alcohol Use: No  . Drug Use: No  . Sexually Active:    Other Topics Concern  . Not on file   Social History Narrative  . No narrative on file    Family History: Family History  Problem Relation Age of Onset  . Heart attack Father   . Diabetes Father     Allergies: No Known Allergies  No current facility-administered medications for this encounter.   Current Outpatient Prescriptions  Medication Sig Dispense Refill  . amLODipine (NORVASC) 2.5 MG tablet TAKE 1 TABLET BY MOUTH ONCE A DAY  30 tablet  10  . aspirin 81 MG tablet Take 81 mg by mouth daily.        Marland Kitchen  atorvastatin (LIPITOR) 20 MG tablet Take 20 mg by mouth daily.        . calcium carbonate (TUMS - DOSED IN MG ELEMENTAL CALCIUM) 500 MG chewable tablet Chew 1 tablet by mouth as needed.        . carvedilol (COREG) 6.25 MG tablet Take 6.25 mg by mouth 2 (two) times daily with a meal.        . furosemide (LASIX) 20 MG tablet Take 20 mg by mouth daily.        Marland Kitchen losartan (COZAAR) 100 MG tablet Take 100 mg by mouth daily.        . metFORMIN (GLUCOPHAGE) 500 MG tablet Take 500 mg by mouth 2 (two) times daily with a meal.        . potassium chloride SA (K-DUR,KLOR-CON) 20 MEQ tablet Take 20 mEq by mouth daily.        . sertraline (ZOLOFT) 50  MG tablet Take 50 mg by mouth daily.        Marland Kitchen spironolactone (ALDACTONE) 25 MG tablet Take 1 tablet (25 mg total) by mouth daily.  30 tablet  11  . vitamin E 400 UNIT capsule Take 400 Units by mouth daily.         Review Of Systems: Per HPI with the following additions: No shortness of breath, chest pain, overt confusion, easy bruising, skin changes, lower extremity swelling, or blood in stool or urine. Otherwise 12 point review of systems was performed and was unremarkable.  Physical Exam: Pulse: 85  Blood Pressure: 150/63 RR: 12   O2: 99 on RA Temp: 97.9  General: alert, cooperative, appears stated age, no distress and does occasionally repeat herself but is otherwise cohesive and appropriate in her interactions HEENT: PERRLA, extra ocular movement intact, sclera clear, anicteric, oropharynx clear, no lesions, neck supple with midline trachea and trachea midline Heart: 3/6 SEM is heard throughout the precordium, regular rate and rhythm, no edema or JVD, no diastolic murmurs heard Lungs: clear to auscultation, no wheezes or rales and unlabored breathing Abdomen: abdomen is soft without significant tenderness, masses, organomegaly or guarding Extremities: extremities normal, atraumatic, no cyanosis or edema and 2+ pulses Skin:no rashes, no ecchymoses, no petechiae, no nodules, no purpura, no wounds Neurology: normal without focal findings, mental status, speech normal, alert and oriented x3, PERLA, cranial nerves 2-12 intact, muscle tone and strength normal and symmetric, sensation grossly normal and no tremors, cogwheeling or rigidity noted  Labs and Imaging: Results for orders placed during the hospital encounter of 05/11/11 (from the past 24 hour(s))  DIFFERENTIAL     Status: Normal   Collection Time   05/11/11 11:03 PM      Component Value Range   Neutrophils Relative 66  43 - 77 (%)   Neutro Abs 4.7  1.7 - 7.7 (K/uL)   Lymphocytes Relative 21  12 - 46 (%)   Lymphs Abs 1.5  0.7 - 4.0  (K/uL)   Monocytes Relative 10  3 - 12 (%)   Monocytes Absolute 0.7  0.1 - 1.0 (K/uL)   Eosinophils Relative 4  0 - 5 (%)   Eosinophils Absolute 0.3  0.0 - 0.7 (K/uL)   Basophils Relative 0  0 - 1 (%)   Basophils Absolute 0.0  0.0 - 0.1 (K/uL)  CBC     Status: Abnormal   Collection Time   05/11/11 11:03 PM      Component Value Range   WBC 7.1  4.0 - 10.5 (K/uL)  RBC 3.57 (*) 3.87 - 5.11 (MIL/uL)   Hemoglobin 11.8 (*) 12.0 - 15.0 (g/dL)   HCT 16.1 (*) 09.6 - 46.0 (%)   MCV 94.1  78.0 - 100.0 (fL)   MCH 33.1  26.0 - 34.0 (pg)   MCHC 35.1  30.0 - 36.0 (g/dL)   RDW 04.5  40.9 - 81.1 (%)   Platelets 53 (*) 150 - 400 (K/uL)  BASIC METABOLIC PANEL     Status: Abnormal   Collection Time   05/11/11 11:03 PM      Component Value Range   Sodium 134 (*) 135 - 145 (mEq/L)   Potassium 3.2 (*) 3.5 - 5.1 (mEq/L)   Chloride 101  96 - 112 (mEq/L)   CO2 22  19 - 32 (mEq/L)   Glucose, Bld 112 (*) 70 - 99 (mg/dL)   BUN 21  6 - 23 (mg/dL)   Creatinine, Ser 9.14  0.50 - 1.10 (mg/dL)   Calcium 9.4  8.4 - 78.2 (mg/dL)   GFR calc non Af Amer 48 (*) >90 (mL/min)   GFR calc Af Amer 56 (*) >90 (mL/min)  POCT I-STAT TROPONIN I     Status: Normal   Collection Time   05/11/11 11:24 PM      Component Value Range   Troponin i, poc 0.04  0.00 - 0.08 (ng/mL)   Comment 3           PROTIME-INR     Status: Normal   Collection Time   05/12/11 12:16 AM      Component Value Range   Prothrombin Time 14.1  11.6 - 15.2 (seconds)   INR 1.07  0.00 - 1.49   APTT     Status: Normal   Collection Time   05/12/11 12:16 AM      Component Value Range   aPTT 32  24 - 37 (seconds)  URINALYSIS, ROUTINE W REFLEX MICROSCOPIC     Status: Abnormal   Collection Time   05/12/11  1:28 AM      Component Value Range   Color, Urine YELLOW  YELLOW    Appearance TURBID (*) CLEAR    Specific Gravity, Urine 1.005  1.005 - 1.030    pH 5.5  5.0 - 8.0    Glucose, UA NEGATIVE  NEGATIVE (mg/dL)   Hgb urine dipstick MODERATE (*)  NEGATIVE    Bilirubin Urine NEGATIVE  NEGATIVE    Ketones, ur NEGATIVE  NEGATIVE (mg/dL)   Protein, ur NEGATIVE  NEGATIVE (mg/dL)   Urobilinogen, UA 0.2  0.0 - 1.0 (mg/dL)   Nitrite NEGATIVE  NEGATIVE    Leukocytes, UA LARGE (*) NEGATIVE   URINE MICROSCOPIC-ADD ON     Status: Abnormal   Collection Time   05/12/11  1:28 AM      Component Value Range   Squamous Epithelial / LPF FEW (*) RARE    WBC, UA 21-50  <3 (WBC/hpf)   RBC / HPF 0-2  <3 (RBC/hpf)   Bacteria, UA RARE  RARE    Chest X-Ray: *RADIOLOGY REPORT*  Clinical Data: Fall. Myocardial infarction. Hypertension.  Diabetes.  CHEST - 2 VIEW  Comparison: 01/08/2008  Findings: Prior CABG noted. There is tortuosity and  atherosclerotic calcification of the thoracic aorta.  Bony demineralization noted.  Thoracic spondylosis noted.  The lungs appear clear.  IMPRESSION:  1. Atherosclerosis.  2. Thoracic spondylosis.  Head CT: *RADIOLOGY REPORT*  Clinical Data: Confusion. Hypertension and diabetes. History of  lymphoma.  CT HEAD WITHOUT CONTRAST  Technique: Contiguous axial images were obtained from the base of  the skull through the vertex without contrast.  Comparison: None.  Findings: Vertebral artery atherosclerotic calcification noted. The  brain stem, cerebellum, cerebral peduncles, thalami, basal ganglia,  basilar cisterns, and ventricular system appear unremarkable.  There is a small acute right subdural hematoma noted on images 12  through the 17 of series 2, maximum thickness 4 mm. Underlying  chronic prominent bilateral extraaxial fluid collections are  observed extending along the cerebral vertex and anteriorly.  No acute CVA or mass lesion is observed.  IMPRESSION:  1. Small acute right frontal subdural hematoma superimposed on  bilateral chronic subdural hematomas.  Critical Value/emergent results were called by telephone to Dr.  Dione Booze (who verbally acknowledged these results) at the time  of  interpretation on 05/11/2011 at 11:57 p.m.  Original Report Authenticated By: Dellia Cloud, M.D.  Assessment and Plan: SERAPHIM AFFINITO is a 77 y.o. year old female presenting with an episode of confusion of uncertain etiology and bilateral chronic subdural hematomas with a small acute subdural. 1. Confusion: Very uncertain etiology.  The most likely cause is related to her subdural hematomas, however metabolic causes and cardiac causes are not completely ruled out. I will admit her, obtain some additional lab work including a B12, TSH, urinalysis, urine drug screen, cardiac enzymes, and EKG. I am not expecting any of these to be particularly revealing, but I feel that this needs to be a diagnosis of exclusion. 2. Subdural: Pt has been discussed in the ED with Dr. Newell Coral of neurosurgery.  He recommends overnight observation and mechanical VTE PPx.  Will place in SDU, perform [facility] neuro checks, and monitor throughout the night. We will plan to formally consult neurosurgery in the morning for their official recommendations. The patient is supposed to be on aspirin per her cardiologist. I will hold this at this time. 3. HTN: The patient is mildly hypertensive at this time. I do not plan to do any intervention for this. I will continue her home medications as per her records from her cardiologist. 4. Hyponatremia: The patient's hyponatremia is extremely mild. I did not feel that it is likely to be related to her confusion. For now I will have her eat a regular diet, and continue to monitor. 5. Hypokalemia: I will replete this with 40 mEq by mouth and recheck in the morning. 6. Thrombocytopenia: This appears to be a new finding for this patient. I will obtain a peripheral smear and recheck in the morning. I will hold all antiplatelet agents at this time due to her bleed. 7. FEN/GI: Diabetic diet 8. Prophylaxis: SCDs per neurosurgery recs 9. Disposition: Pending confirmation of  stability  Lidie Glade 05/12/2011, 2:08 AM

## 2011-05-12 NOTE — Consult Note (Signed)
NAMEANDERSON, COPPOCK NO.:  [id number]  MEDICAL RECORD NO.:  [id number]  LOCATION:  [facility]                         FACILITY:  [facility]  PHYSICIAN:  Mont Dutton, MD    DATE OF BIRTH:  [birth year]  DATE OF CONSULTATION: DATE OF DISCHARGE:                                CONSULTATION   REQUESTING PHYSICIAN:  Santiago Bumpers. Hensel, MD  REASON FOR CONSULTATION:  Altered mental status and subdural hematoma.  HISTORY OF PRESENT ILLNESS:  Ms. Friedhoff is an 77 year old female with a past medical history significant for heart failure, mitral regurgitation, ischemic cardiomyopathy, diabetes, hypertension, and lymphoma in the past, who is admitted for confusion.  She apparently was found in [place], [place] when she ran out of gas.  She states that she was trying to find her way home, but got confused.  In the emergency department, noncontrast head CT revealed large bilateral subdural hygromas and acute small right frontal subdural hematoma.  She was also found to have a urinary tract infection, mild hyponatremia, and thrombocytopenia.  She has been seen by neurosurgery, who feels that the subdural hygromas and subdural hematomas are nonoperative.  REVIEW OF SYSTEMS:  A complete 14-point review of systems is negative; other than the patient mentioned in history of present illness.  PAST MEDICAL HISTORY: 1. Systolic heart failure. 2. Mitral regurgitation. 3. Ischemic cardiomyopathy. 4. Diabetes. 5. Hypertension. 6. Lymphoma in the past. 7. CABG. 8. MI.  HOME MEDICATIONS: 1. Norvasc. 2. Aspirin. 3. Lipitor. 4. Coreg. 5. Lasix. 6. Cozaar. 7. Tums. 8. Metformin. 9. Zoloft. 10.Potassium. 11.Aldactone. 12.Vitamin E.  ALLERGIES:  No known drug allergies.  FAMILY HISTORY:  Noncontributory.  SOCIAL HISTORY:  She lives at home alone in an apartment and normally completes all of her ADLs and drives.  She has family in the area.  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  PHYSICAL EXAMINATION:  VITAL SIGNS:  Temperature 97.7, pulse 84, respirations 25, blood pressure 167/67, and pulse ox 100% on room air. GENERAL:  Well, no apparent distress. CARDIOVASCULAR:  Regular rate and rhythm.  No murmurs. RESPIRATORY:  Clear anteriorly. ABDOMEN:  Soft. SKIN:  No rashes or lesions noted. NEUROLOGIC:  Mental status, she is alert and oriented x4, naming repetition are intact, speech is fluent, no dysarthria.  Cranial nerves, pupils equal, round, and reactive to light bilaterally.  Full visual fields.  Extraocular movements intact.  No facial sensory deficits. Facial expression is symmetric.  Shoulder shrug is 5/5 bilaterally. Tongue midline.  Motor, normal tone and bulk.  Strength 5/5 throughout. No pronator drift.  Deep tendon reflexes 2+/4 throughout with absent ankle jerks bilaterally.  Toes are downgoing.  Coordination, no ataxia. Sensation, no deficits with light touch or temperature bilaterally. Gait, not tested.  IMAGING:  MRI of the brain today shows large bilateral chronic subdural hematomas and a small right frontal subdural hematoma.  No stroke is present.  LABORATORY DATA: 1. BMP upon admission was significant for a sodium level of 134, which     is improved to 139 today.  CBC significant for thrombocytopenia     with a platelet count  of 53.  Coags are within normal limits. 2. Urinalysis reveals large leukocyte esterase, 21-50 white blood     cells, and bacteria present.  ASSESSMENT AND PLAN: 1. Acute on chronic subdural hematoma.  Recommend avoiding     antiplatelets and anticoagulants.  Frequent neuro checks.  Repeat     head CT tomorrow to assess for stability of subdural hematoma.     Neurosurgery is following.  We will have physical and occupational     therapy see her.  The size of her subdural hematoma is too small to     warrant seizure prophylaxis at this time. 2.  Altered mental status, likely secondary to subdural hematoma and     urinary tract infection.  Urinary tract infection is being treated     with ceftriaxone.  Would avoid fluoroquinolones as this can lower     the seizure threshold.  Would recommend obtaining an EEG if she should be     confused again, but she is completely oriented     now, appears to be back to her baseline. 3. Recommend workup for thrombocytopenia.  Thank you this consultation.  We will continue to follow.          ______________________________ Mont Dutton, MD     CS/MEDQ  D:  05/12/2011  T:  05/12/2011  Job:  [id number]  Electronically Signed by Mont Dutton MD on 05/12/2011 05:09:01 PM

## 2011-05-13 ENCOUNTER — Encounter (HOSPITAL_COMMUNITY): Payer: Self-pay

## 2011-05-13 ENCOUNTER — Other Ambulatory Visit (HOSPITAL_COMMUNITY): Payer: BC Managed Care – PPO

## 2011-05-13 ENCOUNTER — Inpatient Hospital Stay (HOSPITAL_COMMUNITY): Payer: BC Managed Care – PPO

## 2011-05-13 DIAGNOSIS — D696 Thrombocytopenia, unspecified: Secondary | ICD-10-CM

## 2011-05-13 LAB — DIFFERENTIAL
Lymphocytes Relative: 13 % (ref 12–46)
Lymphs Abs: 0.6 10*3/uL — ABNORMAL LOW (ref 0.7–4.0)
Monocytes Relative: 9 % (ref 3–12)
Neutro Abs: 3 10*3/uL (ref 1.7–7.7)
Neutrophils Relative %: 73 % (ref 43–77)

## 2011-05-13 LAB — GLUCOSE, CAPILLARY
Glucose-Capillary: 121 mg/dL — ABNORMAL HIGH (ref 70–99)
Glucose-Capillary: 120 mg/dL — ABNORMAL HIGH (ref 70–99)
Glucose-Capillary: 124 mg/dL — ABNORMAL HIGH (ref 70–99)
Glucose-Capillary: 153 mg/dL — ABNORMAL HIGH (ref 70–99)

## 2011-05-13 LAB — CBC
Hemoglobin: 11.1 g/dL — ABNORMAL LOW (ref 12.0–15.0)
MCH: 32.7 pg (ref 26.0–34.0)
MCV: 95 fL (ref 78.0–100.0)
Platelets: 42 10*3/uL — ABNORMAL LOW (ref 150–400)
RBC: 3.39 MIL/uL — ABNORMAL LOW (ref 3.87–5.11)
WBC: 4.2 10*3/uL (ref 4.0–10.5)

## 2011-05-13 LAB — FIBRINOGEN: Fibrinogen: 431 mg/dL (ref 204–475)

## 2011-05-13 LAB — RETICULOCYTES: Retic Count, Absolute: 88.1 10*3/uL (ref 19.0–186.0)

## 2011-05-13 LAB — D-DIMER, QUANTITATIVE: D-Dimer, Quant: 1.62 ug/mL-FEU — ABNORMAL HIGH (ref 0.00–0.48)

## 2011-05-13 LAB — ANA: Anti Nuclear Antibody(ANA): NEGATIVE

## 2011-05-14 ENCOUNTER — Other Ambulatory Visit: Payer: Self-pay | Admitting: Diagnostic Radiology

## 2011-05-14 ENCOUNTER — Inpatient Hospital Stay (HOSPITAL_COMMUNITY): Payer: BC Managed Care – PPO

## 2011-05-14 LAB — DIFFERENTIAL
Eosinophils Absolute: 0.3 10*3/uL (ref 0.0–0.7)
Eosinophils Relative: 6 % — ABNORMAL HIGH (ref 0–5)
Lymphocytes Relative: 26 % (ref 12–46)
Lymphs Abs: 1.1 10*3/uL (ref 0.7–4.0)
Monocytes Absolute: 0.5 10*3/uL (ref 0.1–1.0)

## 2011-05-14 LAB — URINE CULTURE
Colony Count: >=100000
Culture  Setup Time: 201210211120

## 2011-05-14 LAB — COMPREHENSIVE METABOLIC PANEL
AST: 20 U/L (ref 0–37)
Albumin: 3.4 g/dL — ABNORMAL LOW (ref 3.5–5.2)
BUN: 29 mg/dL — ABNORMAL HIGH (ref 6–23)
Calcium: 9.2 mg/dL (ref 8.4–10.5)
Creatinine, Ser: 1.35 mg/dL — ABNORMAL HIGH (ref 0.50–1.10)
Total Protein: 5.9 g/dL — ABNORMAL LOW (ref 6.0–8.3)

## 2011-05-14 LAB — CBC
HCT: 33.7 % — ABNORMAL LOW (ref 36.0–46.0)
MCHC: 34.1 g/dL (ref 30.0–36.0)
MCV: 95.7 fL (ref 78.0–100.0)
Platelets: 44 10*3/uL — ABNORMAL LOW (ref 150–400)
RDW: 13.1 % (ref 11.5–15.5)

## 2011-05-14 LAB — GLUCOSE, CAPILLARY
Glucose-Capillary: 111 mg/dL — ABNORMAL HIGH (ref 70–99)
Glucose-Capillary: 106 mg/dL — ABNORMAL HIGH (ref 70–99)
Glucose-Capillary: 210 mg/dL — ABNORMAL HIGH (ref 70–99)

## 2011-05-14 LAB — HAPTOGLOBIN: Haptoglobin: 155 mg/dL (ref 30–200)

## 2011-05-14 NOTE — Consult Note (Signed)
Lisa James, KEEFE NO.:  [id number]  MEDICAL RECORD NO.:  [id number]  LOCATION:  [facility]                         FACILITY:  [facility]  PHYSICIAN:  Hewitt Shorts, M.D.DATE OF BIRTH:  02-17-1928  DATE OF CONSULTATION:  05/12/2011 DATE OF DISCHARGE:                                CONSULTATION   HISTORY OF PRESENT ILLNESS:  The patient is an 77 year old right-handed white female who was brought by her family to the [facility] Emergency Room last night because of an acute confusional episode yesterday.  Apparently, she drove from the local area and was found in [place], [place] having driven there.  She reports that she ran out of gas and that the police called her son who picked her up and brought her to the emergency room for evaluation.  She herself says that she was trying to drive home and does not know how she ended up in [place], [place].  The patient was evaluated in the emergency room and was found to have a number of abnormalities, including a CT scan which revealed bilateral hemispheric chronic subdural hematomas or hygromas with small amount of acute right frontal subdural hemorrhage, but without significant mass effect, also a platelet count of 53,000 and lastly a urinary tract infection evidenced by urinalysis with 21-50 white blood cells per high- power field.  Neurosurgical consultation was requested.  The patient was to be admitted and was admitted to the [facility], and the patient is now seen in neurosurgical consultation.  Her history is notable for history of hypertension, diabetes mellitus, atherosclerotic cardiovascular disease with a history of myocardial infarction status post a CABG and history of lymphoma.  Her primary physician is Dr. Earl Lites at [facility].  Her cardiologist is Dr. Juanito Doom at [facility], and hematologist and oncologist is Dr. Arlan Organ at [facility].  Symptomatically, the patient denies any headache, diplopia, blurred vision, weakness or seizures.  She does not understand how she ended up in [place].  PAST MEDICAL HISTORY:  Notable for: 1. History of hypertension. 2. Diabetes. 3. Atherosclerotic cardiovascular disease. 4. Myocardial infarction, status post CAB. 5. History of lymphoma.  PREVIOUS SURGICAL HISTORY:  CABG as well as a herniorrhaphy.  ALLERGIES:  She apparently does not have allergies to medications.  MEDICATIONS:  According to the hospital record prior to admission include: 1. Amlodipine 2.5 mg daily. 2. Aspirin 81 mg daily. 3. Lipitor 20 mg daily. 4. Calcium carbonate 1 tablet p.r.n. 5. Coreg 6.25 mg b.i.d. 6. Lasix 20 mg daily. 7. Cozaar 100 mg daily. 8. Metformin 500 mg b.i.d. 9. Potassium chloride 20 mEq daily. 10.Zoloft 50 mg daily. 11.Spironolactone 25 mg daily. 12.Vitamin E 400 international units daily.  FAMILY HISTORY:  Notable for history of myocardial infarction and diabetes.  SOCIAL HISTORY:  The patient is widowed.  She does not smoke.  She does not drink alcoholic beverages.  She is retired.  REVIEW OF SYSTEMS:  Notable for those described in the history of present illness, past medical history, but is otherwise unremarkable.  PHYSICAL EXAMINATION:  GENERAL:  The patient is a well-developed, well- nourished white female, in no acute distress. VITAL  SIGNS:  Temperature is 97.3, pulse 74, and blood pressure 167/67. LUNGS:  Clear to auscultation.  She has symmetric respiratory excursion. HEART:  A 2/6 murmur.  Regular rate and rhythm.  Normal S1 and S2. ABDOMEN:  Soft, nontender, and nondistended.  Bowel sounds are present. EXTREMITIES:  No clubbing, cyanosis, or edema. NEUROLOGIC:  Mental status shows the patient is awake and alert.  She is oriented to her name, hospital as [facility], but she had to look at the calendar on the wall to identify the month and year, although she  did so on her own without cuing.  She follows commands.  Her speech is fluent. Cranial nerves show pupils are equal, round, and reactive to light at about 2 mm bilaterally.  Extraocular movements intact.  Facial movement is symmetrical.  Hearing is present.  Palatal movement is symmetrical. Shoulder shrug is symmetrical.  Tongue is midline.  Motor examination shows 5/5 strength in the upper and lower extremities.  She has no drift to the upper extremities.  Sensation is intact to pinprick throughout. Reflexes are minimal in both the upper and lower extremities.  Toes were downgoing.  Gait and sensory are not tested due to nature of her condition.  IMPRESSION: 1. Acute confusional episodes without cerebrovascular accident. 2. Bilateral chronic subdurals or hygromas with small amount of acute     right frontal subdural hemorrhage, but without significant mass     effect. 3. Significant thrombocytopenia with a platelet count of 53,000. 4. Urinary tract infection with urinalysis showing 21-50 white blood     cells per high-power field. 5. History of lymphoma, hypertension, diabetes, atherosclerotic     cardiovascular disease, myocardial infarction and status post     coronary artery bypass graft.  RECOMMENDATIONS: 1. Neurology consultation regarding acute confusional episode. 2. Hematology-oncology consultation regarding significant     thrombocytopenia. 3. MRI of the brain without contrast to rule out stroke as a     contributing factor to acute confusion. 4. Mechanical venous thromboembolic prophylaxis.  No pharmacologic     venous thromboembolic prophylaxis. I had an opportunity to discuss this with the patient and also with Dr. Doralee Albino, the patient's attending physician, as well as the resident house staff.  We will continue to follow the patient during the hospitalization but at this time, I do not anticipate the need for neurosurgical intervention, but rather at this  time just neurosurgical followup.  As regards to her driving, I do not favor her continuing to drive at this time.     Hewitt Shorts, M.D.     RWN/MEDQ  D:  05/12/2011  T:  05/13/2011  Job:  [id number]  Electronically Signed by Shirlean Kelly M.D. on 05/14/2011 10:09:39 AM

## 2011-05-15 DIAGNOSIS — F4320 Adjustment disorder, unspecified: Secondary | ICD-10-CM

## 2011-05-15 LAB — DIFFERENTIAL
Basophils Absolute: 0 10*3/uL (ref 0.0–0.1)
Basophils Relative: 0 % (ref 0–1)
Eosinophils Relative: 6 % — ABNORMAL HIGH (ref 0–5)
Monocytes Absolute: 0.4 10*3/uL (ref 0.1–1.0)
Monocytes Relative: 8 % (ref 3–12)

## 2011-05-15 LAB — PROTEIN ELECTROPH W RFLX QUANT IMMUNOGLOBULINS
Albumin ELP: 61.9 % (ref 55.8–66.1)
Alpha-2-Globulin: 14.6 % — ABNORMAL HIGH (ref 7.1–11.8)
Beta 2: 4 % (ref 3.2–6.5)
Beta Globulin: 5.9 % (ref 4.7–7.2)

## 2011-05-15 LAB — GLUCOSE, CAPILLARY
Glucose-Capillary: 130 mg/dL — ABNORMAL HIGH (ref 70–99)
Glucose-Capillary: 125 mg/dL — ABNORMAL HIGH (ref 70–99)

## 2011-05-15 LAB — IMMUNOFIXATION ELECTROPHORESIS
IgA: 93 mg/dL (ref 69–380)
IgG (Immunoglobin G), Serum: 498 mg/dL — ABNORMAL LOW (ref 690–1700)
Total Protein ELP: 5.6 g/dL — ABNORMAL LOW (ref 6.0–8.3)

## 2011-05-15 LAB — CBC
HCT: 34.1 % — ABNORMAL LOW (ref 36.0–46.0)
MCH: 33.1 pg (ref 26.0–34.0)
MCHC: 34.9 g/dL (ref 30.0–36.0)
RDW: 13.2 % (ref 11.5–15.5)

## 2011-05-16 LAB — BASIC METABOLIC PANEL
CO2: 25 mEq/L (ref 19–32)
Chloride: 105 mEq/L (ref 96–112)
Creatinine, Ser: 1.25 mg/dL — ABNORMAL HIGH (ref 0.50–1.10)
GFR calc Af Amer: 45 mL/min — ABNORMAL LOW (ref >90)
Potassium: 4 mEq/L (ref 3.5–5.1)
Sodium: 140 mEq/L (ref 135–145)

## 2011-05-16 LAB — DIFFERENTIAL
Basophils Absolute: 0 10*3/uL (ref 0.0–0.1)
Basophils Relative: 0 % (ref 0–1)
Eosinophils Absolute: 0.3 10*3/uL (ref 0.0–0.7)
Lymphocytes Relative: 26 % (ref 12–46)
Monocytes Absolute: 0.5 10*3/uL (ref 0.1–1.0)
Neutrophils Relative %: 59 % (ref 43–77)

## 2011-05-16 LAB — CBC
Platelets: 73 10*3/uL — ABNORMAL LOW (ref 150–400)
RBC: 3.53 MIL/uL — ABNORMAL LOW (ref 3.87–5.11)
RDW: 13.3 % (ref 11.5–15.5)
WBC: 5 10*3/uL (ref 4.0–10.5)

## 2011-05-16 LAB — GLUCOSE, CAPILLARY
Glucose-Capillary: 116 mg/dL — ABNORMAL HIGH (ref 70–99)
Glucose-Capillary: 127 mg/dL — ABNORMAL HIGH (ref 70–99)

## 2011-05-17 LAB — GLUCOSE, CAPILLARY: Glucose-Capillary: 111 mg/dL — ABNORMAL HIGH (ref 70–99)

## 2011-05-17 NOTE — H&P (Signed)
NAMETYNE, BANTA NO.:  [id number]  MEDICAL RECORD NO.:  [id number]  LOCATION:  [facility]                         FACILITY:  [facility]  PHYSICIAN:  Santiago Bumpers. Hensel, M.D.DATE OF BIRTH:  1927-08-04  DATE OF ADMISSION:  05/11/2011 DATE OF DISCHARGE:                             HISTORY & PHYSICAL   PRIMARY CARE:  Brett Canales A. [name], MD at [facility].  CHIEF COMPLAINT:  Confusion.  HISTORY OF PRESENT ILLNESS:  The patient is an 77 year old female presenting with an episode of confusion yesterday morning.  The patient does not remember much of yesterday clearly, but somehow managed to got the [facility] without remembering this.  The patient does report having had a fall on a bus about 1-1/2 weeks ago and did hit her head at that time.  She was seen by her PCP and sent home with symptomatic treatment.  At that time, she failed to mention to her PCP that she had hit her head.  No imaging of her head was performed at this time.  Since then, family members have noticed that she has been acting slightly strange.  The patient does not really remember much of the day, but was found in [place] have been given her car until it ran out of gas.  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 She has not been working since her fall and has very little contact with family members.  Prior to this incident, the patient has been relatively functional and has been living alone without any problems.  The patient does not know list of her medications.  She reports that they have not been changed since her last appointment with her cardiologist.  She was supposed to see her primary care physician today for follow up, but for obvious reasons, missed appointment.  PAST MEDICAL HISTORY: 1. Hyperlipidemia. 2. Hypertension. 3. Diabetes. 4. Hypertension. 5. Coronary artery disease, status post CABG x5. 6. Non-Hodgkin lymphoma of the  throat. 7. Mitral regurgitation. 8. Systolic heart failure.  SOCIAL HISTORY:  The patient is widowed, nonsmoker, denies alcohol or tobacco.  FAMILY HISTORY:  Father had both heart attack and diabetes, the patient has a brother who had Crohn disease.  ALLERGIES:  No known drug allergies.  MEDICATIONS: 1. Norvasc 2.5 mg by mouth daily. 2. Aspirin 81 mg by mouth daily. 3. Lipitor 20 mg by mouth daily. 4. Calcium carbonate 500 mg by mouth as needed. 5. Coreg 6.25 mg by mouth twice daily. 6. Lasix 20 mg by mouth daily. 7. Losartan 100 mg by mouth daily. 8. Metformin 500 mg by mouth twice daily. 9. Potassium chloride 20 mEq by mouth daily. 10.Zoloft 50 mg by mouth daily. 11.Aldactone 25 mg by mouth daily. 12.Vitamin E 400 units by mouth daily.  REVIEW OF SYSTEMS:  As per HPI with the following additions:  No shortness of breath, chest pain, overt confusion, easy bruising, skin changes, lower extremity swelling, or blood in stool or urine. Otherwise 12-point review of systems was performed and was unremarkable.  PHYSICAL EXAM:  VITAL SIGNS:  Pulse 85, blood pressure 150/63, respirations 12, oxygen saturation 99% on room air.  Temperature is 97.9  degrees Fahrenheit.  GENERAL:  Alert, cooperative, appears stated age, in no distress and does occasionally repeats herself but is otherwise cohesive and appropriate in her interaction. HEENT:  Pupils equal, round, reactive to light.  Extraocular movements intact, sclerae are clear and anicteric.  Oropharynx clear, no lesions. NECK:  Supple with midline trachea. CARDIOVASCULAR:  3/6 holosystolic murmur throughout the entire precordium, regular rate and rhythm, no edema or JVD, no diastolic murmurs heard. LUNGS:  Clear to auscultation, no wheezes or rales and unlabored breathing. ABDOMEN:  Soft without significant tenderness, masses, organomegaly, or guarding. EXTREMITIES:  Normal, atraumatic, no cyanosis or edema, 2+ pulses. SKIN:  No  rashes, ecchymoses, petechiae, nodules, purpura, or wound NEURO:  Normal without focal findings. MENTAL STATUS:  Speech and orientation are normal, pupils equal, round and reactive to light.  Cranial nerves 2-12 are intact.  Muscle tone and strength are normal and symmetric, sensation is grossly normal with no tremors, cogwheeling or rigidity.  LABS AND IMAGING:  CBC was notable for a hemoglobin of 11.8, white count of 7.1, and platelets 253.  Basic metabolic was notable only for a sodium of 134, potassium of 3.2, and creatinine of 1.06.  Point of care troponins were 0.04, INR was 1.07.  Urinalysis showed large leukocytes, moderate hemoglobin, no nitrites.  Urine microscopic showed few squamous, 21-50 whites, 0-2 reds, and rare bacteria.  Chest x-ray was unremarkable.  CT of the head demonstrated a small acute right frontal subdural hematoma superimposed on bilateral chronic subdural hematomas.  ASSESSMENT AND PLAN:  This is an 77 year old female presenting with episode of confusion of uncertain etiology and bilateral chronic subdural hematomas with small acute subdural. 1. Confusion:  Very uncertain etiology.  Most likely cause is related     to her subdural hematomas, however, metabolic causes and cardiac     causes are not completely ruled out.  I will admit her, obtain some     additional lab work including a B12, TSH, urine drug screen,     cardiac enzymes, and EKG.  I am not expecting these to be     particularly revealing, but I feel that it should be given her     confusion to her subdural is a diagnosis of exclusion. 2. Subdural: The patient has been discussed in the emergency     department with Dr. Newell Coral of Neurosurgery.  He recommended     overnight observation, mechanical VTE prophylaxis.  We will place     the patient in the step-down unit, perform q.1 hour neuro checks,     and monitor throughout the night.  We will likely formally consult     Neurosurgery and then  want her to have official recommendations.     The patient is supposed to be on aspirin per her cardiologist.  I     will hold this at this time. 3. Hypertension: The patient is mildly hypertensive at this time.  I     did not plan to do any intervention for this.  I will continue her     home medications as per her records from her cardiologist and     monitor closely. 4. Hyponatremia: The patient's hyponatremia is extremely mild.  I did     not feel it is likely to be related to her confusion.  For now, I     will have her eat a regular diet and continue to monitor. 5. Hypokalemia:  I will replete this with 40 mEq by mouth  and recheck     in the morning. 6. Thrombocytopenia: This appears to be a new finding for this     patient.  7.  I will obtain a peripheral smear and recheck in the     morning.  I will hold all anti-platelet agents at this time due to     her bleed. 7. Diabetes: We will hold the patient's metformin at this time and cover     her with sliding scale insulin. I did not see a reason to obtain an A1c     at this point in time, as the patient is followed closely by her     outpatient primary care provider. 8. FEN/GI:  Diabetic diet. 9. Prophylaxis: SCDs per Neurosurgery recommendations.  DISPOSITION:  Pending confirmation of stability.    ______________________________ Majel Homer, MD   ______________________________ Santiago Bumpers. Leveda Anna, M.D.    ER/MEDQ  D:  05/12/2011  T:  05/12/2011  Job:  [id number]  Electronically Signed by Manuela Neptune MD on 05/13/2011 03:56:45 PM Electronically Signed by Doralee Albino M.D. on 05/17/2011 08:26:34 AM

## 2011-05-21 ENCOUNTER — Other Ambulatory Visit: Payer: Self-pay | Admitting: Cardiology

## 2011-05-21 ENCOUNTER — Telehealth: Payer: Self-pay | Admitting: *Deleted

## 2011-05-21 NOTE — Telephone Encounter (Signed)
Received call from Roxanne from [facility]  stating they need to let Dr. Leveda Anna know that they have opened patient to their services and will be seeing her 2 times a week for 3 weeks following fall.

## 2011-05-21 NOTE — Telephone Encounter (Signed)
Also received call from Hennie Duos PT  [phone] For Lisa James about Lisa James.  LM on voice mail that this was a hospital patient only.  I will sign orders to cover up to 30 days post discharge.  I will not sign any orders beyond that date unless she establishes primary care with our office.

## 2011-05-22 ENCOUNTER — Telehealth: Payer: Self-pay | Admitting: *Deleted

## 2011-05-22 ENCOUNTER — Encounter: Payer: Self-pay | Admitting: Family Medicine

## 2011-05-22 ENCOUNTER — Other Ambulatory Visit (HOSPITAL_COMMUNITY): Payer: Self-pay | Admitting: Neurosurgery

## 2011-05-22 DIAGNOSIS — I6200 Nontraumatic subdural hemorrhage, unspecified: Secondary | ICD-10-CM

## 2011-05-22 DIAGNOSIS — D696 Thrombocytopenia, unspecified: Secondary | ICD-10-CM

## 2011-05-22 NOTE — Progress Notes (Signed)
  Subjective:    Patient ID: Lisa James, female    DOB: 1928/01/15, 77 y.o.   MRN: [id number]  HPI Reviewed results of serum protein electrophoresis (nonspecific pattern) done in hospital.    Review of Systems     Objective:   Physical Exam        Assessment & Plan:

## 2011-05-22 NOTE — Telephone Encounter (Signed)
Lisa James [facility], calls to clarify medication for pt.  She was recently discharged from the hospital. Furosemide is on her medication list here and on discharge list.  Pt did not have this medication at home and it was not discontinued at her last office visit. She will call back if refill is needed. Mylo Red RN

## 2011-05-23 ENCOUNTER — Other Ambulatory Visit: Payer: Self-pay | Admitting: Cardiology

## 2011-05-23 NOTE — Telephone Encounter (Signed)
Per Roxane pt need a new rx for lasix 20 mg daily. [facility] [address] [place].

## 2011-05-24 MED ORDER — FUROSEMIDE 20 MG PO TABS: 20.0000 mg | ORAL_TABLET | Freq: Every day | ORAL | Status: DC

## 2011-05-24 NOTE — Discharge Summary (Signed)
Lisa James, Lisa James NO.:  [id number]  MEDICAL RECORD NO.:  [id number]  LOCATION:  [facility]                         FACILITY:  [facility]  PHYSICIAN:  Leighton Roach Srihan Brutus, M.D.DATE OF BIRTH:  03/10/1928  DATE OF ADMISSION:  05/11/2011 DATE OF DISCHARGE:  05/16/2011                              DISCHARGE SUMMARY   ATTENDING PHYSICIAN:  Leighton Roach Inella Kuwahara, MD, [facility].  PRIMARY CARE PROVIDER:  Dr. [name] with [facility].  CONSULTANTS: 1. Pierce Crane, MD, FRCPC, with Hem/Onc. 2. Hewitt Shorts, MD, with Neurosurgery. 3. Mont Dutton, MD, Neurology. 4. Psychiatric consultation for capacity by doctor.  CHIEF COMPLAINT:  Confusion, syncopal episode, altered mental status.  DISCHARGE DIAGNOSES: 1. AMS 2. Subdural hematoma. 3. Urinary tract infection. 4. Thrombocytopenia. 5. Hypertension. 6. Diabetes mellitus type 2. 7. Non-Hodgkin lymphoma. 8. Mitral regurgitation. 9. Systolic heart failure.  BRIEF HOSPITAL COURSE:  [name] was brought to the emergency department by her family after an episode where she was trying to make it from [facility] to [facility] and ended up in [place] which is approximately 3 hours away.  The patient did not report this being problematic and the only reason this occurred was because she ran out of gas after trying to get her tire fixed while she was in [place].  Family brought  her to the emergency department for confusion and she was found to have urinary tract infection as well as acute on chronic subdural hematoma.    Neurosurgerywas consulted as well as Neurology.  She was not felt to be appropriate for any neurosurgical interventions and had serial CT scans that showed slight resolution of the acute subdural hematoma with persistent chronic subdural hematoma.    She was also found to have abnormal platelets reaching as low as  42 with resolution to 73 prior to discharge and Hem/Onc was consulted.  Bone marrow biopsy was performed on this patient while she was hospitalized due to history of non-Hodgkin lymphoma and thrombocytopenia.    Regarding her urinary tract infection, urine culture did show pan sensitive E. coli.  The patient was initially started on Rocephin transvision to Keflex p.o. once sensitivity is returned.  Physical therapy and occupational therapy were consulted during this hospitalization to evaluate the patient, and they did recommend a skilled nursing facility due to her poor safety awareness and unsteadiness on her feet.  In spite of this recommendation by PT and OT, the patient was persistent that she would be going home and due to her poor insight into the circumstances regarding her hospitalization and  an MMSE of 24, psychiatry was asked to evaluate the patient for competence  prior to her discharge.  They did deem her competent to make her own medical decisions.    At the time of discharge, the patient was alert, appropriate and persistently poor insight into her medical condition.  However was very willing and eager to go home.  The patient's condition at the time of discharge improved.  DISCHARGE MEDICATIONS: 1. Keflex 500 mg p.o. t.i.d. for 3 additional days for total of 7. 2. Lasix 20 mg p.o. daily. 3. Amlodipine 2.5  mg p.o. daily. 4. Aspirin 81 mg p.o. daily. 5. Coreg 0.25 mg p.o. b.i.d. 6. Klor-Con 20 mEq 1 tab p.o. daily. 7. Lipitor 1 tab p.o. daily. 8. Losartan 100 mg p.o. daily. 9. Metformin 500 mg 1 tab p.o. b.i.d. 10.Multivitamin. 11.Sertraline 50 mg 1 tab p.o. daily. 12.Spironolactone 25 mg 1 tab p.o. daily.  FOLLOWUP APPOINTMENTS:  She is to follow up with Dr. Caron Presume in 1 month. It is felt that this acute delirium was caused by the UTI that caused the thrombocytopenia.  She is to follow Dr. Newell Coral in 2-3 weeks and will receive a repeat CT scan.  She is to  follow up with Dr. [name] in 1-2 weeks [facility] to discuss her ongoing medical needs including appropriateness of her living situation including her anticoagulation status due to her prior heart disease in the setting of acute on chronic subdural hematoma.  Following up concern for dementia, she did have a MMSE performed while she was in the hospital that did show a score of 24 since the reason for the competency evaluation by psychiatry.  She will need monitoring of her blood pressure, her blood sugars as well as coordination of her overall medical care.  She does need a bone marrow biopsy results to be followed and report to her as we do not have this information at the time of discharge.  No other lab tests were pending at the time of discharge.    ______________________________ Gaspar Bidding, DO   ______________________________ Leighton Roach Kellis Topete, M.D.    MR/MEDQ  D:  05/19/2011  T:  05/20/2011  Job:  [id number]  Electronically Signed by Gaspar Bidding  on 05/24/2011 03:48:12 PM Electronically Signed by Acquanetta Belling M.D. on 05/24/2011 05:22:21 PM

## 2011-06-05 ENCOUNTER — Other Ambulatory Visit (HOSPITAL_COMMUNITY): Payer: BC Managed Care – PPO

## 2011-06-05 ENCOUNTER — Ambulatory Visit (HOSPITAL_COMMUNITY)
Admission: RE | Admit: 2011-06-05 | Discharge: 2011-06-05 | Disposition: A | Discharge status: Home / Self Care | Payer: BC Managed Care – PPO | Source: Ambulatory Visit | Attending: Neurosurgery | Admitting: Neurosurgery

## 2011-06-05 DIAGNOSIS — D696 Thrombocytopenia, unspecified: Secondary | ICD-10-CM | POA: Insufficient documentation

## 2011-06-05 DIAGNOSIS — I6200 Nontraumatic subdural hemorrhage, unspecified: Secondary | ICD-10-CM | POA: Insufficient documentation

## 2011-06-06 ENCOUNTER — Other Ambulatory Visit (HOSPITAL_COMMUNITY): Payer: Self-pay | Admitting: Neurosurgery

## 2011-06-06 DIAGNOSIS — D696 Thrombocytopenia, unspecified: Secondary | ICD-10-CM

## 2011-06-06 DIAGNOSIS — I6200 Nontraumatic subdural hemorrhage, unspecified: Secondary | ICD-10-CM

## 2011-06-06 DIAGNOSIS — R4182 Altered mental status, unspecified: Secondary | ICD-10-CM

## 2011-06-06 NOTE — Consult Note (Signed)
Lisa James, Lisa James NO.:  [id number]  MEDICAL RECORD NO.:  [id number]  LOCATION:  [facility]                         FACILITY:  [facility]  PHYSICIAN:  Pierce Crane, M.D., F.R.C.P.C.DATE OF BIRTH:  10/31/1927  DATE OF CONSULTATION: DATE OF DISCHARGE:                                CONSULTATION   PROBLEM:  Thrombocytopenia and recent subdural hematomas.  This is a delightful 77 year old woman from [place] who presents with subdural hematomas with abnormal platelet count.  HISTORY OF PRESENT ILLNESS:  This woman lives by herself and is actually in fairly good health.  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  About 2 weeks ago, she had fallen while in the bus and did not seek medical attention for this. She did see a doctor 2 days later.  She was doing fairly well, but did have some episodes of confusion.  She apparently had driven to [place], [place] and had went out of gas, but she does not have any recollection of this.  She was brought to the hospital by her children. She did have a CT scan, which showed a small acute right subdural hematoma as well as bilateral chronic subdural hematomas.  Of note is that she also had pyuria.  CBC showed a white count of 7.1, hemoglobin of 11.8, and platelet count of 53,000.  Platelet function assays were performed, which were abnormal, likely related to the low platelet count and use of ASA.  Other labs showed a sodium of 134, creatinine of 1.06, and calcium of 9.4.  The patient was admitted for followup.  PAST MEDICAL HISTORY:  The patient previously been treated by Dr. 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  I do not have any specific details.  I suspect she may receive chemotherapy as well as local radiation.  She has not seen Dr. Myna Hidalgo recently.  The bone marrow was done in 2004 because of  moderately low platelets.  This did not reveal any specific abnormalities or did not reveal any evidence of lymphoma. Her medical history is also significant for a history of previous MI status post bypass surgery, history of mitral regurgitation.  She has a history of type 2 diabetes as well as hyperlipidemia and history of heart failure, bypass surgery in 2009.  SOCIAL HISTORY:  The patient is widowed.  Has 3 elder children, one of whom is here today.  She has a positive family history of heart attack and diabetes.  She has a mother who is still living and living at nursing home with age 66.  ALLERGIES:  None.  SMOKING AND ALCOHOL USE:  None.  MEDICATIONS: Medications list was reviewed.  She is on ASA.  She is no other platelet- inhibiting drugs, i.e., Plavix.  PHYSICAL EXAMINATION:  GENERAL:  Delightful woman, looks in stated age. Of note, she does not have any bleeding or bruising.  Does not exhibit any bleeding anywhere. VITAL SIGNS:  Blood pressure 150/50, temperature 97, pulse is 85, respiratory rate 20. HEENT:  No palpable adenopathy in head and neck area.  Oropharynx normal.  No thrush.  Extraocular  movements are normal. LUNGS:  Clear. HEART SOUNDS:  Systolic murmur heard.  Both axillae negative.  No palpable peripheral adenopathy. BREAST:  Not examined. ABDOMEN:  Soft.  I cannot appreciate any hepatosplenomegaly.  No inguinal adenopathy. EXTREMITIES:  No obvious petechiae or purpura on her lower extremities.  IMPRESSION AND PLAN:  History of thrombocytopenia in the phase of subdural hematomas.  The patient is remarkably stable.  She is quiet lucid, though does __________ a little bit.  I have recommended that at this point we monitor platelet count and elect to transfuse should she have any deterioration in her status or should she have some decrease in her platelet count.  I will follow up tomorrow with review of her peripheral smear and other labs to rule out the  consumption. Ultimately, I think she would benefit from a bone marrow biopsy given her history of lymphoma.  On review of her platelet counts that can be never found in the computer, her platelet counts have been in low normal range, i.e., 120.  There have been a few incidents where her platelets were in the 80,000 range as well.  I will continue to follow this patient while in the hospital. Arrangements were made with Interventional Radiology of a bone marrow done, hopefully by tomorrow.     Pierce Crane, M.D., F.R.C.P.C.     PR/MEDQ  D:  05/12/2011  T:  05/13/2011  Job:  [id number]  cc:   William A. Leveda Anna, M.D. Hewitt Shorts, M.D. [name] [name], M.D.  Electronically Signed by Pierce Crane MD on 06/06/2011 04:10:38 PM

## 2011-06-19 ENCOUNTER — Other Ambulatory Visit: Payer: BC Managed Care – PPO | Admitting: Lab

## 2011-06-21 ENCOUNTER — Ambulatory Visit (HOSPITAL_BASED_OUTPATIENT_CLINIC_OR_DEPARTMENT_OTHER): Payer: BC Managed Care – PPO | Admitting: Hematology & Oncology

## 2011-06-21 ENCOUNTER — Other Ambulatory Visit (HOSPITAL_BASED_OUTPATIENT_CLINIC_OR_DEPARTMENT_OTHER): Payer: BC Managed Care – PPO | Admitting: Lab

## 2011-06-21 VITALS — BP 148/74 | HR 62 | Temp 96.8°F | Ht 61.5 in | Wt 116.0 lb

## 2011-06-21 DIAGNOSIS — D696 Thrombocytopenia, unspecified: Secondary | ICD-10-CM

## 2011-06-21 DIAGNOSIS — Z87898 Personal history of other specified conditions: Secondary | ICD-10-CM

## 2011-06-21 LAB — CBC WITH DIFFERENTIAL (CANCER CENTER ONLY)
BASO#: 0 10*3/uL (ref 0.0–0.2)
EOS%: 4.3 % (ref 0.0–7.0)
Eosinophils Absolute: 0.3 10*3/uL (ref 0.0–0.5)
LYMPH%: 20.6 % (ref 14.0–48.0)
MCH: 33.6 pg (ref 26.0–34.0)
MCHC: 35.4 g/dL (ref 32.0–36.0)
MCV: 95 fL (ref 81–101)
MONO%: 9.3 % (ref 0.0–13.0)
NEUT#: 4.6 10*3/uL (ref 1.5–6.5)
Platelets: 77 10*3/uL — ABNORMAL LOW (ref 145–400)
RBC: 3.69 10*6/uL — ABNORMAL LOW (ref 3.70–5.32)

## 2011-06-21 LAB — CHCC SATELLITE - SMEAR

## 2011-06-21 NOTE — Progress Notes (Signed)
DIAGNOSIS: 1. Chronic - benign thrombocytopenia. 2. History of stage II follicular large cell lymphoma.  CURRENT THERAPY:  Observation.  INTERIM HISTORY:  Lisa James comes in for an unexpected visit.  She was last seen back in 2009.  She was hospitalized over at [facility]. This was about 3 weeks ago.  She apparently had a subdural hematoma. She did not require any surgery.  She was found to have a low platelet count.  She was seen by Dr. Donnie Coffin.  A bone marrow test was done.  The bone marrow report ([id number]) showed hypercellular marrow with primary karyocytes.  She was then referred back to me to see if there was anything else that we needed to do for her.  She looks pretty good.  She sounds good.  She is eating well.  She has had no bleeding or bruising.  She did have a urinary infection, which may have caused some of this confusion.  She did fall, which allowed for the subdural hematoma to occur.  PHYSICAL EXAM:  General: This is a elderly, petite white female in no obvious distress.  Vital signs show a temperature of 96.8, pulse 62, respiratory 18, and blood pressure 140/74.  Weight is 160.  Head and neck exam shows a normocephalic, atraumatic skull.  There are no ocular or oral lesions.  There are no palpable cervical or supraclavicular lymph nodes.  Lungs are clear bilaterally.  Cardiac examination: Regular rhythm with a normal S1 and S2.  There are no murmurs, rubs, or bruits.  Abdominal exam: Soft with good bowel sounds.  There is no palpable abdominal mass.  There is no fluid wave.  There is no palpable hepatosplenomegaly.  Extremities shows age-related osteoarthritic changes.  She has good range of motion of her joints.  Skin exam: No rashes, ecchymosis, or petechia.  Neurological exam:  No focal neurological deficits.  LABORATORY STUDIES:  White cell count 7, hemoglobin 12.4, hematocrit 35, and platelet count 37,000.  IMPRESSION:  Lisa James is an 77 year old  white female with a remote history of lymphoma.  She was treated with R-CHOP back in September 2004.  There is no evidence of recurrence.  WHEN We saw her back in 2009, HER platelet count 37,000.  I suspect that this is what her "normal." By her bone marrow test, it sounds like she may have some degree of immune-based thrombocytopenia.  I do not believe at all that her thrombocytopenia attributed to the subdural hematoma.  I think she would have the subdural hematoma even if her platelet count was normal.  I just do not see that we have to get her back to visit [place].  We are just not adding anything to her medical care.  We will plan to see her back if necessary, however.  It was really nice to see Lisa James again.  She is awful sweet.    ______________________________ Josph Macho, M.D. PRE/MEDQ  D:  06/21/2011  T:  06/21/2011  Job:  [id number]

## 2011-06-21 NOTE — Progress Notes (Signed)
This office note has been dictated.

## 2011-07-02 ENCOUNTER — Other Ambulatory Visit (HOSPITAL_COMMUNITY): Payer: BC Managed Care – PPO

## 2011-07-02 ENCOUNTER — Ambulatory Visit (INDEPENDENT_AMBULATORY_CARE_PROVIDER_SITE_OTHER): Payer: BC Managed Care – PPO | Admitting: Emergency Medicine

## 2011-07-02 DIAGNOSIS — D696 Thrombocytopenia, unspecified: Secondary | ICD-10-CM

## 2011-07-02 DIAGNOSIS — Z23 Encounter for immunization: Secondary | ICD-10-CM

## 2011-07-02 DIAGNOSIS — I6200 Nontraumatic subdural hemorrhage, unspecified: Secondary | ICD-10-CM

## 2011-07-02 DIAGNOSIS — E119 Type 2 diabetes mellitus without complications: Secondary | ICD-10-CM

## 2011-07-02 DIAGNOSIS — R404 Transient alteration of awareness: Secondary | ICD-10-CM

## 2011-07-03 ENCOUNTER — Encounter: Payer: Self-pay | Admitting: Family Medicine

## 2011-07-03 DIAGNOSIS — F32A Depression, unspecified: Secondary | ICD-10-CM

## 2011-07-03 DIAGNOSIS — I739 Peripheral vascular disease, unspecified: Secondary | ICD-10-CM

## 2011-07-03 DIAGNOSIS — S065XAA Traumatic subdural hemorrhage with loss of consciousness status unknown, initial encounter: Secondary | ICD-10-CM | POA: Insufficient documentation

## 2011-07-03 DIAGNOSIS — S065X9A Traumatic subdural hemorrhage with loss of consciousness of unspecified duration, initial encounter: Secondary | ICD-10-CM | POA: Insufficient documentation

## 2011-07-03 DIAGNOSIS — E559 Vitamin D deficiency, unspecified: Secondary | ICD-10-CM | POA: Insufficient documentation

## 2011-07-03 DIAGNOSIS — I2510 Atherosclerotic heart disease of native coronary artery without angina pectoris: Secondary | ICD-10-CM

## 2011-07-03 DIAGNOSIS — C8590 Non-Hodgkin lymphoma, unspecified, unspecified site: Secondary | ICD-10-CM | POA: Insufficient documentation

## 2011-07-03 DIAGNOSIS — F329 Major depressive disorder, single episode, unspecified: Secondary | ICD-10-CM

## 2011-07-03 DIAGNOSIS — E119 Type 2 diabetes mellitus without complications: Secondary | ICD-10-CM | POA: Insufficient documentation

## 2011-07-04 ENCOUNTER — Ambulatory Visit (HOSPITAL_COMMUNITY)
Admission: RE | Admit: 2011-07-04 | Discharge: 2011-07-04 | Disposition: A | Discharge status: Home / Self Care | Payer: BC Managed Care – PPO | Source: Ambulatory Visit | Attending: Neurosurgery | Admitting: Neurosurgery

## 2011-07-04 ENCOUNTER — Other Ambulatory Visit (HOSPITAL_COMMUNITY): Payer: Self-pay | Admitting: Neurosurgery

## 2011-07-04 DIAGNOSIS — Z09 Encounter for follow-up examination after completed treatment for conditions other than malignant neoplasm: Secondary | ICD-10-CM | POA: Insufficient documentation

## 2011-07-04 DIAGNOSIS — R4182 Altered mental status, unspecified: Secondary | ICD-10-CM | POA: Insufficient documentation

## 2011-07-04 DIAGNOSIS — D696 Thrombocytopenia, unspecified: Secondary | ICD-10-CM

## 2011-07-04 DIAGNOSIS — I6200 Nontraumatic subdural hemorrhage, unspecified: Secondary | ICD-10-CM | POA: Insufficient documentation

## 2011-08-13 ENCOUNTER — Ambulatory Visit (INDEPENDENT_AMBULATORY_CARE_PROVIDER_SITE_OTHER): Payer: BC Managed Care – PPO | Admitting: Emergency Medicine

## 2011-08-13 DIAGNOSIS — E119 Type 2 diabetes mellitus without complications: Secondary | ICD-10-CM

## 2011-08-13 DIAGNOSIS — I2510 Atherosclerotic heart disease of native coronary artery without angina pectoris: Secondary | ICD-10-CM

## 2011-08-16 ENCOUNTER — Ambulatory Visit (HOSPITAL_COMMUNITY)
Admission: RE | Admit: 2011-08-16 | Discharge: 2011-08-16 | Disposition: A | Discharge status: Home / Self Care | Payer: BC Managed Care – PPO | Source: Ambulatory Visit | Attending: Neurosurgery | Admitting: Neurosurgery

## 2011-08-16 DIAGNOSIS — Z09 Encounter for follow-up examination after completed treatment for conditions other than malignant neoplasm: Secondary | ICD-10-CM | POA: Insufficient documentation

## 2011-08-16 DIAGNOSIS — I6200 Nontraumatic subdural hemorrhage, unspecified: Secondary | ICD-10-CM | POA: Insufficient documentation

## 2011-08-16 DIAGNOSIS — I6789 Other cerebrovascular disease: Secondary | ICD-10-CM | POA: Insufficient documentation

## 2011-08-16 DIAGNOSIS — G319 Degenerative disease of nervous system, unspecified: Secondary | ICD-10-CM | POA: Insufficient documentation

## 2011-08-27 ENCOUNTER — Other Ambulatory Visit: Payer: Self-pay | Admitting: Emergency Medicine

## 2011-08-29 ENCOUNTER — Other Ambulatory Visit (HOSPITAL_COMMUNITY): Payer: Self-pay | Admitting: Neurosurgery

## 2011-08-29 DIAGNOSIS — R4182 Altered mental status, unspecified: Secondary | ICD-10-CM

## 2011-08-29 DIAGNOSIS — I6200 Nontraumatic subdural hemorrhage, unspecified: Secondary | ICD-10-CM

## 2011-09-20 ENCOUNTER — Inpatient Hospital Stay (HOSPITAL_COMMUNITY): Admission: RE | Admit: 2011-09-20 | Payer: Medicare Other | Source: Ambulatory Visit

## 2011-09-24 ENCOUNTER — Ambulatory Visit (HOSPITAL_COMMUNITY)
Admission: RE | Admit: 2011-09-24 | Discharge: 2011-09-24 | Disposition: A | Discharge status: Home / Self Care | Payer: BC Managed Care – PPO | Source: Ambulatory Visit | Attending: Neurosurgery | Admitting: Neurosurgery

## 2011-09-24 DIAGNOSIS — I6200 Nontraumatic subdural hemorrhage, unspecified: Secondary | ICD-10-CM | POA: Insufficient documentation

## 2011-09-24 DIAGNOSIS — R4182 Altered mental status, unspecified: Secondary | ICD-10-CM

## 2011-11-12 ENCOUNTER — Encounter: Payer: Self-pay | Admitting: Emergency Medicine

## 2011-11-12 ENCOUNTER — Ambulatory Visit (INDEPENDENT_AMBULATORY_CARE_PROVIDER_SITE_OTHER): Payer: BC Managed Care – PPO | Admitting: Emergency Medicine

## 2011-11-12 DIAGNOSIS — D696 Thrombocytopenia, unspecified: Secondary | ICD-10-CM

## 2011-11-12 DIAGNOSIS — E119 Type 2 diabetes mellitus without complications: Secondary | ICD-10-CM

## 2011-11-12 LAB — COMPREHENSIVE METABOLIC PANEL
AST: 18 U/L (ref 0–37)
Albumin: 4.6 g/dL (ref 3.5–5.2)
Alkaline Phosphatase: 69 U/L (ref 39–117)
Potassium: 4.4 mEq/L (ref 3.5–5.3)
Sodium: 136 mEq/L (ref 135–145)
Total Bilirubin: 0.8 mg/dL (ref 0.3–1.2)
Total Protein: 6.5 g/dL (ref 6.0–8.3)

## 2011-11-12 LAB — POCT GLYCOSYLATED HEMOGLOBIN (HGB A1C): Hemoglobin A1C: 5.5

## 2011-11-12 LAB — GLUCOSE, POCT (MANUAL RESULT ENTRY): POC Glucose: 106

## 2011-11-12 MED ORDER — METFORMIN HCL 500 MG PO TABS: 500.0000 mg | ORAL_TABLET | ORAL | Status: DC

## 2011-11-12 NOTE — Progress Notes (Signed)
  Subjective:    Patient ID: Lisa James, female    DOB: 1927/10/30, 78 y.o.   MRN: [id number]  HPI patient enters for followup of her diabetes she also is being followed by Dr. Jule Ser for subdural hematoma. She also has had issues with thrombocytopenia however on recent CBCs her platelet counts have been normal. She is here with her son who states she is doing very well her appetite is good she has no complaints. She has had no neurological symptoms to report .    Review of Systems she is a sees Dr. Cleotis Lema. She is known to have coronary disease and loud aortic murmur for which he follows her     Objective:   Physical Exam pupils are equal and reactive. He is alert and cooperative test is clear to reveals a 3/6 systolic murmur at the base of the heart the abdomen is soft nontender neurological exam is nonfocal        Assessment & Plan:   Assessment patient stable at the present time. Gland check her glucose and A1c. Check a CBC to see the status of her platelets as they have been low in the past. She will continue her routine followups regarding her subdural hematoma. She also has regular followups with her oncologist. I've given her the number for Dr. wall who is her heart doctor so she can call and make an appointment there

## 2011-11-13 LAB — CBC WITH DIFFERENTIAL/PLATELET
Basophils Absolute: 0 10*3/uL (ref 0.0–0.1)
Basophils Relative: 0 % (ref 0–1)
Eosinophils Absolute: 0.4 10*3/uL (ref 0.0–0.7)
Hemoglobin: 11.9 g/dL — ABNORMAL LOW (ref 12.0–15.0)
MCH: 32.4 pg (ref 26.0–34.0)
MCHC: 34.9 g/dL (ref 30.0–36.0)
Neutro Abs: 4.3 10*3/uL (ref 1.7–7.7)
Neutrophils Relative %: 65 % (ref 43–77)
Platelets: 77 10*3/uL — ABNORMAL LOW (ref 150–400)
RDW: 12.8 % (ref 11.5–15.5)

## 2011-11-19 ENCOUNTER — Other Ambulatory Visit (HOSPITAL_COMMUNITY): Payer: Self-pay | Admitting: Neurosurgery

## 2011-11-19 DIAGNOSIS — I6200 Nontraumatic subdural hemorrhage, unspecified: Secondary | ICD-10-CM

## 2011-11-27 ENCOUNTER — Other Ambulatory Visit (HOSPITAL_COMMUNITY): Payer: Medicare Other

## 2011-11-28 ENCOUNTER — Ambulatory Visit (HOSPITAL_COMMUNITY)
Admission: RE | Admit: 2011-11-28 | Discharge: 2011-11-28 | Disposition: A | Discharge status: Home / Self Care | Payer: Medicare Other | Source: Ambulatory Visit | Attending: Neurosurgery | Admitting: Neurosurgery

## 2011-11-28 DIAGNOSIS — I6200 Nontraumatic subdural hemorrhage, unspecified: Secondary | ICD-10-CM | POA: Insufficient documentation

## 2011-12-09 ENCOUNTER — Other Ambulatory Visit: Payer: Self-pay | Admitting: Physician Assistant

## 2011-12-09 ENCOUNTER — Other Ambulatory Visit: Payer: Self-pay | Admitting: Cardiology

## 2011-12-22 ENCOUNTER — Other Ambulatory Visit: Payer: Self-pay | Admitting: Physician Assistant

## 2012-01-12 ENCOUNTER — Other Ambulatory Visit: Payer: Self-pay | Admitting: Cardiology

## 2012-02-07 ENCOUNTER — Encounter: Payer: Self-pay | Admitting: Emergency Medicine

## 2012-02-11 ENCOUNTER — Ambulatory Visit: Payer: BC Managed Care – PPO | Admitting: Emergency Medicine

## 2012-02-15 ENCOUNTER — Other Ambulatory Visit: Payer: Self-pay | Admitting: Cardiology

## 2012-02-15 ENCOUNTER — Other Ambulatory Visit: Payer: Self-pay | Admitting: Physician Assistant

## 2012-02-25 ENCOUNTER — Ambulatory Visit: Payer: BC Managed Care – PPO | Admitting: Emergency Medicine

## 2012-02-27 ENCOUNTER — Other Ambulatory Visit (HOSPITAL_COMMUNITY): Payer: Self-pay | Admitting: Neurosurgery

## 2012-02-27 DIAGNOSIS — I6200 Nontraumatic subdural hemorrhage, unspecified: Secondary | ICD-10-CM

## 2012-02-28 ENCOUNTER — Other Ambulatory Visit: Payer: Self-pay | Admitting: Cardiology

## 2012-02-28 ENCOUNTER — Ambulatory Visit (HOSPITAL_COMMUNITY)
Admission: RE | Admit: 2012-02-28 | Discharge: 2012-02-28 | Disposition: A | Discharge status: Home / Self Care | Payer: Medicare Other | Source: Ambulatory Visit | Attending: Neurosurgery | Admitting: Neurosurgery

## 2012-02-28 DIAGNOSIS — I6200 Nontraumatic subdural hemorrhage, unspecified: Secondary | ICD-10-CM | POA: Insufficient documentation

## 2012-02-28 NOTE — Telephone Encounter (Signed)
Pt needs appointment then refill can be made Fax Received. Refill Completed. Arvind Mexicano Chowoe (R.M.A).  Refill ok per Mylo Red.

## 2012-03-10 ENCOUNTER — Ambulatory Visit (INDEPENDENT_AMBULATORY_CARE_PROVIDER_SITE_OTHER): Payer: Medicare Other | Admitting: Emergency Medicine

## 2012-03-10 VITALS — BP 129/63 | HR 68 | Temp 98.1°F | Resp 16 | Ht 59.5 in | Wt 115.0 lb

## 2012-03-10 DIAGNOSIS — E119 Type 2 diabetes mellitus without complications: Secondary | ICD-10-CM

## 2012-03-10 DIAGNOSIS — E782 Mixed hyperlipidemia: Secondary | ICD-10-CM

## 2012-03-10 DIAGNOSIS — I10 Essential (primary) hypertension: Secondary | ICD-10-CM

## 2012-03-10 LAB — COMPREHENSIVE METABOLIC PANEL
ALT: 10 U/L (ref 0–35)
CO2: 24 mEq/L (ref 19–32)
Calcium: 9.2 mg/dL (ref 8.4–10.5)
Chloride: 108 mEq/L (ref 96–112)
Creat: 1.32 mg/dL — ABNORMAL HIGH (ref 0.50–1.10)
Glucose, Bld: 99 mg/dL (ref 70–99)
Total Bilirubin: 0.7 mg/dL (ref 0.3–1.2)
Total Protein: 6.2 g/dL (ref 6.0–8.3)

## 2012-03-10 LAB — LIPID PANEL
Cholesterol: 114 mg/dL (ref 0–200)
Total CHOL/HDL Ratio: 4.2 Ratio
Triglycerides: 162 mg/dL — ABNORMAL HIGH (ref <150)
VLDL: 32 mg/dL (ref 0–40)

## 2012-03-10 NOTE — Progress Notes (Signed)
  Subjective:    Patient ID: Lisa James, female    DOB: [birth year], 78 y.o.   MRN: [id number]  HPI patient enters for followup of her hypertension and diabetes. She also has coronary disease and is known to have aortic sclerosis. She sees Dr. wall for this. She's been followed by neurosurgeon because of a subdural hematoma she is known that the she has not any progressive symptoms and overall feels well today. She's currently living in an apartment. She has a son who keeps a close watch on her.    Review of Systems     Objective:   Physical Exam Lisa James looks good today. She's alert and cooperative and not in any distress. Her chest is clear cardiac exam reveals a 3/6 systolic murmur at the base of the heart which radiates up into the neck. Extremities are without edema  Results for orders placed in visit on 03/10/12  GLUCOSE, POCT (MANUAL RESULT ENTRY)      Component Value Range   POC Glucose 113 (*) 70 - 99 mg/dl  POCT GLYCOSYLATED HEMOGLOBIN (HGB A1C)      Component Value Range   Hemoglobin A1C 5.3         Assessment & Plan:  We'll check baseline lab slip he was encouraged to get a medic alert button. Labs were done .

## 2012-04-02 ENCOUNTER — Other Ambulatory Visit: Payer: Self-pay | Admitting: Physician Assistant

## 2012-04-26 ENCOUNTER — Other Ambulatory Visit: Payer: Self-pay | Admitting: Cardiology

## 2012-04-26 ENCOUNTER — Other Ambulatory Visit: Payer: Self-pay | Admitting: Physician Assistant

## 2012-04-27 ENCOUNTER — Telehealth: Payer: Self-pay

## 2012-04-27 NOTE — Telephone Encounter (Signed)
Notified patient need to contact Dr. Jaclyn Prime office for an appointment and we will call in medication for amlodipine.

## 2012-06-09 ENCOUNTER — Other Ambulatory Visit: Payer: Self-pay | Admitting: *Deleted

## 2012-06-09 ENCOUNTER — Ambulatory Visit (INDEPENDENT_AMBULATORY_CARE_PROVIDER_SITE_OTHER): Payer: Medicare Other | Admitting: Emergency Medicine

## 2012-06-09 ENCOUNTER — Encounter: Payer: Self-pay | Admitting: Emergency Medicine

## 2012-06-09 VITALS — BP 104/63 | HR 73 | Temp 97.8°F | Resp 18 | Ht 59.0 in | Wt 123.0 lb

## 2012-06-09 DIAGNOSIS — I10 Essential (primary) hypertension: Secondary | ICD-10-CM

## 2012-06-09 DIAGNOSIS — E119 Type 2 diabetes mellitus without complications: Secondary | ICD-10-CM

## 2012-06-09 LAB — COMPREHENSIVE METABOLIC PANEL
Albumin: 4.5 g/dL (ref 3.5–5.2)
BUN: 27 mg/dL — ABNORMAL HIGH (ref 6–23)
Calcium: 10 mg/dL (ref 8.4–10.5)
Chloride: 101 mEq/L (ref 96–112)
Creat: 1.66 mg/dL — ABNORMAL HIGH (ref 0.50–1.10)
Glucose, Bld: 124 mg/dL — ABNORMAL HIGH (ref 70–99)
Potassium: 4.5 mEq/L (ref 3.5–5.3)

## 2012-06-09 LAB — CBC
HCT: 35.2 % — ABNORMAL LOW (ref 36.0–46.0)
Hemoglobin: 12.1 g/dL (ref 12.0–15.0)
MCV: 96.7 fL (ref 78.0–100.0)
RDW: 13.4 % (ref 11.5–15.5)
WBC: 7.6 10*3/uL (ref 4.0–10.5)

## 2012-06-09 LAB — GLUCOSE, POCT (MANUAL RESULT ENTRY): POC Glucose: 130 mg/dl — AB (ref 70–99)

## 2012-06-09 LAB — POCT GLYCOSYLATED HEMOGLOBIN (HGB A1C): Hemoglobin A1C: 6.7

## 2012-06-09 MED ORDER — SPIRONOLACTONE 25 MG PO TABS: 25.0000 mg | ORAL_TABLET | Freq: Every day | ORAL | Status: DC

## 2012-06-09 NOTE — Progress Notes (Signed)
  Subjective:    Patient ID: Lisa James, female    DOB: September 22, 1927, 78 y.o.   MRN: [id number]  HPI patient here for recheck on cholesterol and diabetes. She also has heart disease with congestive heart failure. She has not seen Dr. wall recently. She is also a history of peripheral arterial disease. She has recently been followed for a subdural hematoma which has been stable and did not require surgery. She has a history of non-Hodgkin's lymphoma which has been in remission   Review of Systems     Objective:   Physical Exam patient is alert and cooperative not in distress. Her neck is supple her chest is clear to auscultation and percussion cardiac exam reveals a 3/6 systolic murmur at the left sternal border  Results for orders placed in visit on 06/09/12  GLUCOSE, POCT (MANUAL RESULT ENTRY)      Component Value Range   POC Glucose 130 (*) 70 - 99 mg/dl  POCT GLYCOSYLATED HEMOGLOBIN (HGB A1C)      Component Value Range   Hemoglobin A1C 6.7          Assessment & Plan:  Patient stable at present. We'll get an opinion from Dr. wall since he has not seen her for over a year. No change in medications she has had a flu shot this year. Her hemoglobin A1c had been normal in the fives over the last 2 visits but now 6.7. Patient admits to baking cakes and eating a good deal of sweets over the last few months .

## 2012-06-10 ENCOUNTER — Other Ambulatory Visit: Payer: Self-pay | Admitting: *Deleted

## 2012-06-10 MED ORDER — POTASSIUM CHLORIDE CRYS ER 20 MEQ PO TBCR: 20.0000 meq | EXTENDED_RELEASE_TABLET | Freq: Every day | ORAL | Status: DC

## 2012-07-01 ENCOUNTER — Other Ambulatory Visit: Payer: Self-pay | Admitting: Physician Assistant

## 2012-07-13 ENCOUNTER — Other Ambulatory Visit: Payer: Self-pay | Admitting: *Deleted

## 2012-07-13 MED ORDER — POTASSIUM CHLORIDE CRYS ER 20 MEQ PO TBCR: 20.0000 meq | EXTENDED_RELEASE_TABLET | Freq: Every day | ORAL | Status: DC

## 2012-08-26 ENCOUNTER — Other Ambulatory Visit: Payer: Self-pay | Admitting: *Deleted

## 2012-08-26 MED ORDER — POTASSIUM CHLORIDE CRYS ER 20 MEQ PO TBCR: 20.0000 meq | EXTENDED_RELEASE_TABLET | Freq: Every day | ORAL | Status: DC

## 2012-09-05 ENCOUNTER — Other Ambulatory Visit: Payer: Self-pay | Admitting: Physician Assistant

## 2012-09-05 ENCOUNTER — Other Ambulatory Visit: Payer: Self-pay | Admitting: Emergency Medicine

## 2012-09-07 NOTE — Telephone Encounter (Signed)
Chart indicates allergy to this medication.

## 2012-09-22 ENCOUNTER — Ambulatory Visit: Payer: BC Managed Care – PPO | Admitting: Emergency Medicine

## 2012-09-29 ENCOUNTER — Other Ambulatory Visit: Payer: Self-pay | Admitting: Emergency Medicine

## 2012-09-29 ENCOUNTER — Encounter: Payer: Self-pay | Admitting: Emergency Medicine

## 2012-09-29 ENCOUNTER — Other Ambulatory Visit: Payer: Self-pay | Admitting: Cardiology

## 2012-09-29 ENCOUNTER — Ambulatory Visit (INDEPENDENT_AMBULATORY_CARE_PROVIDER_SITE_OTHER): Payer: Medicare Other | Admitting: Emergency Medicine

## 2012-09-29 VITALS — BP 130/80 | HR 74 | Temp 98.1°F | Resp 16 | Ht <= 58 in | Wt 122.2 lb

## 2012-09-29 DIAGNOSIS — E119 Type 2 diabetes mellitus without complications: Secondary | ICD-10-CM

## 2012-09-29 DIAGNOSIS — I25810 Atherosclerosis of coronary artery bypass graft(s) without angina pectoris: Secondary | ICD-10-CM

## 2012-09-29 DIAGNOSIS — E7800 Pure hypercholesterolemia, unspecified: Secondary | ICD-10-CM

## 2012-09-29 DIAGNOSIS — R011 Cardiac murmur, unspecified: Secondary | ICD-10-CM

## 2012-09-29 DIAGNOSIS — I10 Essential (primary) hypertension: Secondary | ICD-10-CM

## 2012-09-29 LAB — CBC WITH DIFFERENTIAL/PLATELET
Basophils Absolute: 0 10*3/uL (ref 0.0–0.1)
Eosinophils Relative: 8 % — ABNORMAL HIGH (ref 0–5)
Lymphocytes Relative: 22 % (ref 12–46)
Lymphs Abs: 1.2 10*3/uL (ref 0.7–4.0)
MCV: 96.7 fL (ref 78.0–100.0)
Neutro Abs: 3.5 10*3/uL (ref 1.7–7.7)
Neutrophils Relative %: 62 % (ref 43–77)
Platelets: 67 10*3/uL — ABNORMAL LOW (ref 150–400)
RBC: 3.68 MIL/uL — ABNORMAL LOW (ref 3.87–5.11)
RDW: 13.6 % (ref 11.5–15.5)
WBC: 5.7 10*3/uL (ref 4.0–10.5)

## 2012-09-29 LAB — GLUCOSE, POCT (MANUAL RESULT ENTRY): POC Glucose: 101 mg/dl — AB (ref 70–99)

## 2012-09-29 LAB — POCT GLYCOSYLATED HEMOGLOBIN (HGB A1C): Hemoglobin A1C: 6.4

## 2012-09-29 NOTE — Patient Instructions (Signed)
Please take your Lasix and K-Lor con every other day. I am making an appointment to see Dr. Daleen Squibb to followup on your heart murmur. No change in the medicines. Continue to exercise and eat appropriately.

## 2012-09-29 NOTE — Progress Notes (Signed)
  Subjective:    Patient ID: Lisa James, female    DOB: [birth year], 79 y.o.   MRN: [id number]  HPI problem #1 diabetes. There is some question whether she has stopped her Glucophage which I believe she has she does not have it with her today she has been watching her diet. Her last hemoglobin A1c was 6.7 but she had been eating a lot of sweets of the holidays. Problem #2 is hypertension she continues to take her medications for this she does have a prescription in the record for amlodipine but does not have this problem with her. Artery disease. At our last visit she was instructed to see Dr. Daleen Squibb again. She has a significant systolic murmur at the base of the heart and has not had followup of this. Problem #4 subdural hematoma this has been stable and did not require surgical intervention and she denies any neurological symptoms at the present time and says she feels great.    Review of Systems     Objective:   Physical Exam patient is alert and cooperative and in no distress. There is a macular papular rash over the top of the nose and both cheeks. Her neck is supple. Lungs are clear to auscultation and percussion. Her cardiac exam reveals a harsh 3/6 systolic murmur at the left sternal border which radiates up into the neck. Abdomen is soft nontender. Extremities are without edema she has significantly diminished pulses in the lower extremities with known PAD        Assessment & Plan:  Double check with [facility] her know what medications she is on. I made a referral to Dr. Daleen Squibb for cardiac followup since she has not had this recently.

## 2012-09-30 LAB — COMPREHENSIVE METABOLIC PANEL
ALT: 14 U/L (ref 0–35)
AST: 23 U/L (ref 0–37)
Calcium: 10.2 mg/dL (ref 8.4–10.5)
Chloride: 107 mEq/L (ref 96–112)
Creat: 1.7 mg/dL — ABNORMAL HIGH (ref 0.50–1.10)
Sodium: 140 mEq/L (ref 135–145)
Total Protein: 6.5 g/dL (ref 6.0–8.3)

## 2012-10-05 LAB — PROTEIN ELECTROPHORESIS, SERUM
Albumin ELP: 63.4 % (ref 55.8–66.1)
Alpha-1-Globulin: 4.3 % (ref 2.9–4.9)
Beta 2: 3.6 % (ref 3.2–6.5)
Beta Globulin: 7.2 % (ref 4.7–7.2)

## 2012-10-06 LAB — IMMUNOFIXATION ELECTROPHORESIS
IgA: 115 mg/dL (ref 69–380)
IgM, Serum: 73 mg/dL (ref 52–322)

## 2012-10-15 ENCOUNTER — Other Ambulatory Visit: Payer: Self-pay | Admitting: Cardiology

## 2012-11-07 ENCOUNTER — Other Ambulatory Visit: Payer: Self-pay | Admitting: Physician Assistant

## 2012-11-07 ENCOUNTER — Other Ambulatory Visit: Payer: Self-pay | Admitting: Cardiology

## 2012-11-19 ENCOUNTER — Telehealth: Payer: Self-pay | Admitting: Family Medicine

## 2012-11-19 ENCOUNTER — Emergency Department: Payer: Self-pay | Admitting: Internal Medicine

## 2012-11-19 ENCOUNTER — Telehealth: Payer: Self-pay | Admitting: Emergency Medicine

## 2012-11-19 ENCOUNTER — Inpatient Hospital Stay (HOSPITAL_COMMUNITY)
Admission: AD | Admit: 2012-11-19 | Discharge: 2012-11-23 | DRG: 086 | Disposition: A | Discharge status: Skilled Nursing Facility | Payer: Medicare Other | Source: Other Acute Inpatient Hospital | Attending: Family Medicine | Admitting: Family Medicine

## 2012-11-19 DIAGNOSIS — E785 Hyperlipidemia, unspecified: Secondary | ICD-10-CM | POA: Diagnosis present

## 2012-11-19 DIAGNOSIS — E119 Type 2 diabetes mellitus without complications: Secondary | ICD-10-CM | POA: Diagnosis present

## 2012-11-19 DIAGNOSIS — I10 Essential (primary) hypertension: Secondary | ICD-10-CM | POA: Diagnosis present

## 2012-11-19 DIAGNOSIS — Z8249 Family history of ischemic heart disease and other diseases of the circulatory system: Secondary | ICD-10-CM

## 2012-11-19 DIAGNOSIS — W1809XA Striking against other object with subsequent fall, initial encounter: Secondary | ICD-10-CM | POA: Diagnosis present

## 2012-11-19 DIAGNOSIS — E559 Vitamin D deficiency, unspecified: Secondary | ICD-10-CM | POA: Diagnosis present

## 2012-11-19 DIAGNOSIS — I129 Hypertensive chronic kidney disease with stage 1 through stage 4 chronic kidney disease, or unspecified chronic kidney disease: Secondary | ICD-10-CM | POA: Diagnosis present

## 2012-11-19 DIAGNOSIS — D696 Thrombocytopenia, unspecified: Secondary | ICD-10-CM | POA: Diagnosis present

## 2012-11-19 DIAGNOSIS — Z602 Problems related to living alone: Secondary | ICD-10-CM

## 2012-11-19 DIAGNOSIS — Z9181 History of falling: Secondary | ICD-10-CM

## 2012-11-19 DIAGNOSIS — Z951 Presence of aortocoronary bypass graft: Secondary | ICD-10-CM

## 2012-11-19 DIAGNOSIS — Y92009 Unspecified place in unspecified non-institutional (private) residence as the place of occurrence of the external cause: Secondary | ICD-10-CM

## 2012-11-19 DIAGNOSIS — I252 Old myocardial infarction: Secondary | ICD-10-CM

## 2012-11-19 DIAGNOSIS — S065X9A Traumatic subdural hemorrhage with loss of consciousness of unspecified duration, initial encounter: Secondary | ICD-10-CM | POA: Diagnosis present

## 2012-11-19 DIAGNOSIS — N184 Chronic kidney disease, stage 4 (severe): Secondary | ICD-10-CM | POA: Diagnosis present

## 2012-11-19 DIAGNOSIS — I509 Heart failure, unspecified: Secondary | ICD-10-CM | POA: Diagnosis present

## 2012-11-19 DIAGNOSIS — S065XAA Traumatic subdural hemorrhage with loss of consciousness status unknown, initial encounter: Secondary | ICD-10-CM | POA: Diagnosis present

## 2012-11-19 DIAGNOSIS — C8581 Other specified types of non-Hodgkin lymphoma, lymph nodes of head, face, and neck: Secondary | ICD-10-CM | POA: Diagnosis present

## 2012-11-19 DIAGNOSIS — R410 Disorientation, unspecified: Secondary | ICD-10-CM

## 2012-11-19 DIAGNOSIS — I2510 Atherosclerotic heart disease of native coronary artery without angina pectoris: Secondary | ICD-10-CM | POA: Diagnosis present

## 2012-11-19 DIAGNOSIS — Z79899 Other long term (current) drug therapy: Secondary | ICD-10-CM

## 2012-11-19 DIAGNOSIS — S065X0A Traumatic subdural hemorrhage without loss of consciousness, initial encounter: Principal | ICD-10-CM | POA: Diagnosis present

## 2012-11-19 DIAGNOSIS — I059 Rheumatic mitral valve disease, unspecified: Secondary | ICD-10-CM | POA: Diagnosis present

## 2012-11-19 DIAGNOSIS — N183 Chronic kidney disease, stage 3 unspecified: Secondary | ICD-10-CM | POA: Diagnosis present

## 2012-11-19 DIAGNOSIS — R5381 Other malaise: Secondary | ICD-10-CM | POA: Diagnosis present

## 2012-11-19 DIAGNOSIS — I5022 Chronic systolic (congestive) heart failure: Secondary | ICD-10-CM | POA: Diagnosis present

## 2012-11-19 DIAGNOSIS — Z888 Allergy status to other drugs, medicaments and biological substances status: Secondary | ICD-10-CM

## 2012-11-19 DIAGNOSIS — Z833 Family history of diabetes mellitus: Secondary | ICD-10-CM

## 2012-11-19 LAB — CBC
HCT: 32.7 % — ABNORMAL LOW (ref 35.0–47.0)
MCH: 34.6 pg — ABNORMAL HIGH (ref 26.0–34.0)
MCHC: 34.8 g/dL (ref 32.0–36.0)
WBC: 5.9 10*3/uL (ref 3.6–11.0)

## 2012-11-19 LAB — COMPREHENSIVE METABOLIC PANEL
Albumin: 4.2 g/dL (ref 3.4–5.0)
Alkaline Phosphatase: 69 U/L (ref 50–136)
BUN: 32 mg/dL — ABNORMAL HIGH (ref 7–18)
Bilirubin,Total: 1.1 mg/dL — ABNORMAL HIGH (ref 0.2–1.0)
Calcium, Total: 10.6 mg/dL — ABNORMAL HIGH (ref 8.5–10.1)
Chloride: 109 mmol/L — ABNORMAL HIGH (ref 98–107)
Co2: 24 mmol/L (ref 21–32)
Creatinine: 1.76 mg/dL — ABNORMAL HIGH (ref 0.60–1.30)
EGFR (African American): 30 — ABNORMAL LOW
EGFR (Non-African Amer.): 26 — ABNORMAL LOW
Glucose: 105 mg/dL — ABNORMAL HIGH (ref 65–99)
Osmolality: 290 (ref 275–301)
Potassium: 4.3 mmol/L (ref 3.5–5.1)
SGOT(AST): 99 U/L — ABNORMAL HIGH (ref 15–37)
SGPT (ALT): 52 U/L (ref 12–78)
Sodium: 142 mmol/L (ref 136–145)

## 2012-11-19 LAB — TROPONIN I: Troponin-I: 0.07 ng/mL — ABNORMAL HIGH

## 2012-11-19 LAB — URINALYSIS, COMPLETE
Bilirubin,UR: NEGATIVE
Nitrite: POSITIVE
Protein: NEGATIVE
RBC,UR: 4 /HPF (ref 0–5)
Specific Gravity: 1.014 (ref 1.003–1.030)
WBC UR: 31 /HPF (ref 0–5)

## 2012-11-19 LAB — PROTIME-INR: Prothrombin Time: 14.8 secs — ABNORMAL HIGH (ref 11.5–14.7)

## 2012-11-19 NOTE — Telephone Encounter (Signed)
Telephone call to daughter Drinda Butts. Aide is confused. I advised the daughter to call 911. I called the ER and told the ER about her subdural history.She is to go to [facility] ER.

## 2012-11-19 NOTE — Telephone Encounter (Signed)
Daughter called to let us know that mother was seeing things again that she was seeing people outside her window and that people was standing in her house when she woke up this morning and wanted to know what she need to do about mother i spoke with Dr [name] and he stated that he was going to call daughter to advise her to take mother to hospital to get another ct of head

## 2012-11-20 ENCOUNTER — Inpatient Hospital Stay (HOSPITAL_COMMUNITY): Payer: Medicare Other

## 2012-11-20 DIAGNOSIS — I6200 Nontraumatic subdural hemorrhage, unspecified: Secondary | ICD-10-CM

## 2012-11-20 DIAGNOSIS — I5022 Chronic systolic (congestive) heart failure: Secondary | ICD-10-CM

## 2012-11-20 LAB — CBC
MCHC: 34.7 g/dL (ref 30.0–36.0)
Platelets: 47 10*3/uL — ABNORMAL LOW (ref 150–400)
RDW: 13.6 % (ref 11.5–15.5)
WBC: 3.9 10*3/uL — ABNORMAL LOW (ref 4.0–10.5)

## 2012-11-20 LAB — BASIC METABOLIC PANEL
Chloride: 106 mEq/L (ref 96–112)
GFR calc Af Amer: 35 mL/min — ABNORMAL LOW (ref >90)
GFR calc non Af Amer: 30 mL/min — ABNORMAL LOW (ref >90)
Potassium: 3.7 mEq/L (ref 3.5–5.1)
Sodium: 142 mEq/L (ref 135–145)

## 2012-11-20 LAB — TROPONIN I
Troponin I: <0.3 ng/mL (ref <0.30)
Troponin I: <0.3 ng/mL (ref <0.30)

## 2012-11-20 MED ORDER — VITAMIN E 180 MG (400 UNIT) PO CAPS
400.0000 [IU] | ORAL_CAPSULE | Freq: Every day | ORAL | Status: DC
  Administered 2012-11-20 – 2012-11-23 (×4): 400 [IU] via ORAL
  Filled 2012-11-20 (×4): qty 1

## 2012-11-20 MED ORDER — CALCIUM CARBONATE-VITAMIN D 500-200 MG-UNIT PO TABS
1.0000 | ORAL_TABLET | Freq: Every day | ORAL | Status: DC
  Administered 2012-11-20 – 2012-11-23 (×4): 1 via ORAL
  Filled 2012-11-20 (×4): qty 1

## 2012-11-20 MED ORDER — FUROSEMIDE 20 MG PO TABS
20.0000 mg | ORAL_TABLET | Freq: Every day | ORAL | Status: DC
  Administered 2012-11-20: 20 mg via ORAL
  Filled 2012-11-20: qty 1

## 2012-11-20 MED ORDER — LOSARTAN POTASSIUM 50 MG PO TABS
100.0000 mg | ORAL_TABLET | Freq: Every day | ORAL | Status: DC
  Administered 2012-11-20 – 2012-11-23 (×4): 100 mg via ORAL
  Filled 2012-11-20 (×4): qty 2

## 2012-11-20 MED ORDER — POTASSIUM CHLORIDE CRYS ER 20 MEQ PO TBCR
20.0000 meq | EXTENDED_RELEASE_TABLET | Freq: Every day | ORAL | Status: DC
  Administered 2012-11-20 – 2012-11-23 (×4): 20 meq via ORAL
  Filled 2012-11-20 (×4): qty 1

## 2012-11-20 MED ORDER — ATORVASTATIN CALCIUM 20 MG PO TABS
20.0000 mg | ORAL_TABLET | Freq: Every day | ORAL | Status: DC
  Administered 2012-11-20 – 2012-11-22 (×3): 20 mg via ORAL
  Filled 2012-11-20 (×4): qty 1

## 2012-11-20 MED ORDER — CARVEDILOL 6.25 MG PO TABS
6.2500 mg | ORAL_TABLET | Freq: Two times a day (BID) | ORAL | Status: DC
  Administered 2012-11-20 – 2012-11-23 (×7): 6.25 mg via ORAL
  Filled 2012-11-20 (×9): qty 1

## 2012-11-20 MED ORDER — SERTRALINE HCL 50 MG PO TABS
50.0000 mg | ORAL_TABLET | Freq: Every day | ORAL | Status: DC
  Administered 2012-11-20 – 2012-11-23 (×4): 50 mg via ORAL
  Filled 2012-11-20 (×4): qty 1

## 2012-11-20 MED ORDER — AMLODIPINE BESYLATE 2.5 MG PO TABS
2.5000 mg | ORAL_TABLET | Freq: Every day | ORAL | Status: DC
  Administered 2012-11-20 – 2012-11-22 (×3): 2.5 mg via ORAL
  Filled 2012-11-20 (×4): qty 1

## 2012-11-20 MED ORDER — CALCIUM CARBONATE ANTACID 500 MG PO CHEW
1.0000 | CHEWABLE_TABLET | Freq: Three times a day (TID) | ORAL | Status: DC | PRN
  Filled 2012-11-20: qty 1

## 2012-11-20 MED ORDER — INSULIN ASPART 100 UNIT/ML ~~LOC~~ SOLN
0.0000 [IU] | Freq: Three times a day (TID) | SUBCUTANEOUS | Status: DC
  Administered 2012-11-21: 2 [IU] via SUBCUTANEOUS
  Administered 2012-11-23: 1 [IU] via SUBCUTANEOUS

## 2012-11-20 MED ORDER — SPIRONOLACTONE 25 MG PO TABS
25.0000 mg | ORAL_TABLET | Freq: Every day | ORAL | Status: DC
  Administered 2012-11-20 – 2012-11-23 (×4): 25 mg via ORAL
  Filled 2012-11-20 (×4): qty 1

## 2012-11-20 MED ORDER — SODIUM CHLORIDE 0.9 % IJ SOLN
3.0000 mL | Freq: Two times a day (BID) | INTRAMUSCULAR | Status: DC
  Administered 2012-11-20 – 2012-11-23 (×7): 3 mL via INTRAVENOUS

## 2012-11-20 MED ORDER — METFORMIN HCL 500 MG PO TABS
500.0000 mg | ORAL_TABLET | Freq: Every day | ORAL | Status: DC
  Administered 2012-11-20 – 2012-11-22 (×3): 500 mg via ORAL
  Filled 2012-11-20 (×4): qty 1

## 2012-11-20 MED ORDER — FLUTICASONE PROPIONATE 50 MCG/ACT NA SUSP
1.0000 | Freq: Every day | NASAL | Status: DC | PRN
  Administered 2012-11-21 – 2012-11-22 (×2): 1 via NASAL
  Filled 2012-11-20: qty 16

## 2012-11-20 NOTE — Progress Notes (Signed)
2:54 PM I agree with HPI/GPe and A/P per Dr. Julian Reil  Patient is doing fair.  She states she has no CP/N/V/Blurred or double vision or any weakness or dizziness.  SHe isn;t really able to tell me wher eshe is, or what year it is.  She can tell me the season. She relates she has had some falls.  She has tol breakfast      HEENT-eomi.  No noted bruise or scar CHEST-clear CARDIAC-s1 s2 no m/r/g ABDOMEN-soft, no rebound,nt.nd NEURO-grossly intact  Patient Active Problem List   Diagnosis Date Noted  . Diabetes mellitus 07/03/2011  . Depression 07/03/2011  . Non-Hodgkin's lymphoma 07/03/2011  . CAD (coronary artery disease) 07/03/2011  . PAD (peripheral artery disease) 07/03/2011  . Vitamin d deficiency 07/03/2011  . Subdural hematoma 07/03/2011  . Confusion 05/12/2011  . Subdural hematoma, acute 05/12/2011  . HYPERLIPIDEMIA TYPE IIB / III 05/10/2009  . OLD MYOCARDIAL INFARCTION 05/10/2009  . Occlusion and stenosis of carotid artery without mention of cerebral infarction 05/10/2009  . HYPERTENSION, BENIGN 11/03/2008  . MITRAL REGURGITATION 10/29/2008  . ISCHEMIC HEART DISEASE 10/29/2008  . SYSTOLIC HEART FAILURE, CHRONIC 10/29/2008   A/p Patient has been seen by NS and has been cleared from their standpoint.  However her answers are inconsistent and i am not sure she can ambulate safely.  We will get PT/Ot to assess her and deem if she is appropriate for d/c home. I did call the number on the chart for her son, but didn't get  A response. We will get orthostatic vitals to rule out orthostasis as she is on numerous Htn meds-I will hold lasix for now given not overtly fluid overloaded She may need to be off of metformin given borderline creat.   Pleas Koch, MD [facility] [phone]

## 2012-11-20 NOTE — Care Management Note (Signed)
    Page 1 of 1   11/20/2012     4:58:40 PM   CARE MANAGEMENT NOTE 11/20/2012  Patient:  Lisa James, Lisa James   Account Number:  [id number]  Date Initiated:  11/20/2012  Documentation initiated by:  GRAVES-BIGELOW,Emerie Vanderkolk  Subjective/Objective Assessment:   Pt admitted with subdural hemorrhage. Pt lives alone and dose not have 24 hr supervision.     Action/Plan:   CM was able to speak to [facility] and she wants [employer]. CSW aware and will make the weekend CSW aware. NO further needs from CM at this time.   Anticipated DC Date:  11/23/2012   Anticipated DC Plan:  SKILLED NURSING FACILITY  In-house referral  Clinical Social Worker      DC Planning Services  CM consult      Choice offered to / List presented to:             Status of service:  Completed, signed off Medicare Important Message given?   (If response is "NO", the following Medicare IM given date fields will be blank) Date Medicare IM given:   Date Additional Medicare IM given:    Discharge Disposition:  SKILLED NURSING FACILITY  Per UR Regulation:  Reviewed for med. necessity/level of care/duration of stay  If discussed at Long Length of Stay Meetings, dates discussed:    Comments:

## 2012-11-20 NOTE — H&P (Signed)
[facility] History and Physical  TAUSHA MILHOAN BJY:[id number] DOB: August 02, 1927 DOA: 11/19/2012  Referring physician: ED PCP: Lucilla Edin, MD  Specialists: None  Chief Complaint: Fall  HPI: Lisa James is a 79 y.o. female with PMH of multiple falls and hitting her head multiple times in the past including PMH of SDH who presented to the ED after she fell and hit her head again at home.  Niece noted that the patient was confused when she was talking to the patient on the phone and so had the patient sent in to the ED.  At the ED CT scan of the patients head demonstrated acute on subacute SDH R sided 10mm in size with 6mm midline shift. (Previously had 4mm midline shift several months ago with prior CT head).  Neurosurgery has been consulted but given that the patient appears to have returned to baseline mentation per family recommended only observation at this point and possibly repeat scan tomorrow.  Hospitalist has been asked to admit.  Review of Systems: 12 systems reviewed and otherwise negative.  Past Medical History  Diagnosis Date  . Acute MI, lateral wall   . Chronic systolic heart failure   . Ischemic heart disease   . Mitral regurgitation   . HTN (hypertension)   . DM (diabetes mellitus)   . Non Hodgkin's lymphoma     of the throat   Past Surgical History  Procedure Laterality Date  . Coronary artery bypass graft  11/20/2007    x5  . Bone marrow biopsy  11/22/2002    left posterior iliac crest bone marrow biopsy and aspirate  . Submental lymph node excisional biopsy  10/22/2002   Social History:  reports that she has never smoked. She does not have any smokeless tobacco history on file. She reports that she does not drink alcohol or use illicit drugs.   Allergies  Allergen Reactions  . Ace Inhibitors Cough    Family History  Problem Relation Age of Onset  . Heart attack Father   . Diabetes Father     Prior to Admission medications   Medication Sig  Start Date End Date Taking? Authorizing Provider  amLODipine (NORVASC) 2.5 MG tablet TAKE 1 TABLET BY MOUTH ONCE A DAY 04/26/12   Gaylord Shih, MD  aspirin 81 MG tablet Take 81 mg by mouth daily.      Historical Provider, MD  atorvastatin (LIPITOR) 20 MG tablet TAKE 1 TABLET BY MOUTH EVERY DAY 11/07/12   Nelva Nay, PA-C  calcium carbonate (TUMS - DOSED IN MG ELEMENTAL CALCIUM) 500 MG chewable tablet Chew 1 tablet by mouth as needed.      Historical Provider, MD  calcium-vitamin D (OSCAL WITH D) 500-200 MG-UNIT per tablet Take 1 tablet by mouth daily.    Historical Provider, MD  carvedilol (COREG) 6.25 MG tablet TAKE 1 TABLET TWICE A DAY 09/05/12   Heather M Marte, PA-C  cephALEXin [facility]) 500 MG capsule  05/16/11   Historical Provider, MD  ciprofloxacin (CIPRO) 250 MG tablet  06/16/11   Historical Provider, MD  fluticasone (FLONASE) 50 MCG/ACT nasal spray  05/22/11   Historical Provider, MD  furosemide (LASIX) 20 MG tablet TAKE 1 TABLET BY MOUTH DAILY 10/15/12   Gaylord Shih, MD  furosemide (LASIX) 20 MG tablet TAKE 1 TABLET BY MOUTH DAILY 11/07/12   Gaylord Shih, MD  KLOR-CON M20 20 MEQ tablet TAKE 1 TABLET BY MOUTH EVERY DAY - NEEDS APPT FOR MORE REFILLS  11/07/12   Gaylord Shih, MD  losartan (COZAAR) 100 MG tablet TAKE 1 TABLET BY MOUTH EVERY DAY 12/09/11   Chelle S Jeffery, PA-C  losartan (COZAAR) 100 MG tablet TAKE 1 TABLET BY MOUTH EVERY DAY 04/26/12   Anders Simmonds, PA-C  losartan (COZAAR) 100 MG tablet TAKE 1 TABLET BY MOUTH EVERY DAY 11/07/12   Nelva Nay, PA-C  metFORMIN (GLUCOPHAGE) 500 MG tablet Take 1 tablet (500 mg total) by mouth 1 day or 1 dose. 11/12/11   Collene Gobble, MD  OVER THE COUNTER MEDICATION Anti diarrheal prn    Historical Provider, MD  sertraline (ZOLOFT) 50 MG tablet TAKE 1 TABLET BY MOUTH EVERY DAY 04/02/12   Nelva Nay, PA-C  spironolactone (ALDACTONE) 25 MG tablet Take 1 tablet (25 mg total) by mouth daily. 06/09/12   Gaylord Shih, MD  vitamin E 400  UNIT capsule Take 400 Units by mouth daily.      Historical Provider, MD   Physical Exam: Filed Vitals:   11/19/12 2225  BP: 168/83  Pulse: 98  Temp: 98.3 F (36.8 C)  TempSrc: Oral  Resp: 18  Height: 5' (1.524 m)  Weight: 55.293 kg (121 lb 14.4 oz)  SpO2: 98%    General:  NAD, resting comfortably in bed, wakes easily to voice Eyes: PEERLA EOMI ENT: mucous membranes moist Neck: supple w/o JVD Cardiovascular: RRR w/o MRG Respiratory: CTA B Abdomen: soft, nt, nd, bs+ Skin: no rash nor lesion Musculoskeletal: MAE, full ROM all 4 extremities Psychiatric: normal tone and affect Neurologic: alert and oriented to person, location, situation, but not date, grossly non-focal   Labs on Admission:  Basic Metabolic Panel: No results found for this basename: NA, K, CL, CO2, GLUCOSE, BUN, CREATININE, CALCIUM, MG, PHOS,  in the last 168 hours Liver Function Tests: No results found for this basename: AST, ALT, ALKPHOS, BILITOT, PROT, ALBUMIN,  in the last 168 hours No results found for this basename: LIPASE, AMYLASE,  in the last 168 hours No results found for this basename: AMMONIA,  in the last 168 hours CBC: No results found for this basename: WBC, NEUTROABS, HGB, HCT, MCV, PLT,  in the last 168 hours Cardiac Enzymes: No results found for this basename: CKTOTAL, CKMB, CKMBINDEX, TROPONINI,  in the last 168 hours  BNP (last 3 results) No results found for this basename: PROBNP,  in the last 8760 hours CBG: No results found for this basename: GLUCAP,  in the last 168 hours  Radiological Exams on Admission: No results found.  EKG: Independently reviewed.  Assessment/Plan Principal Problem:   Subdural hematoma, acute   1. Acute on chronic SDH - CT scan of head demonstrated 6mm midline shift compared to 4mm midline shift on old, neurosurgery wasn't very impressed especially given that the patient seems to be very close to or at her baseline mental status.  On my exam it was only  dates that she had trouble with, otherwise being able to appropriately answer questions.  Serial neuro checks ordered, neuro surg to eval in AM. 2. Troponin of 0.07 - will check serial troponins but no cardiac symptoms and patient states clearly that she didn't loose consciousness when she fell and hit her head today. 3. Chronic conditions: HTN, DM2, hyperlipidemia - plan to continue home meds, but need pharmacy to clarify med rec first.  Hold ASA due to acute bleed.  Spoke with Dr. Phoebe Perch neuro checks and he will evaluate in AM.  Code Status: Full  Code (must indicate code status--if unknown or must be presumed, indicate so) Family Communication: No family in room (indicate person spoken with, if applicable, with phone number if by telephone) Disposition Plan: Admit to inpatient (indicate anticipated LOS)  Time spent: 70 min  Jazsmin Couse M. [facility] Pager [phone]  If 7PM-7AM, please contact night-coverage [email or web address] Password [facility] 11/20/2012, 12:05 AM

## 2012-11-20 NOTE — Consult Note (Signed)
Reason for Consult:SDH Referring Physician:Jared Electa Sniff, DO   Lisa James is an 79 y.o. female.  HPI: pt with hx of non operated chronic SDH  - had some confusion yesterday and taken to ER - CT showed SDH, that hospital had no comparisons and there was a question of elevated cardiac enzymes and pt transferred to [facility].   Pt now with no c/o  - says she wants to go home -   PMH sig also for thrombocytopenia  PMH:  Past Medical History  Diagnosis Date  . Acute MI, lateral wall   . Chronic systolic heart failure   . Ischemic heart disease   . Mitral regurgitation   . HTN (hypertension)   . DM (diabetes mellitus)   . Non Hodgkin's lymphoma     of the throat    Past Surgical History  Procedure Laterality Date  . Coronary artery bypass graft  11/20/2007    x5  . Bone marrow biopsy  11/22/2002    left posterior iliac crest bone marrow biopsy and aspirate  . Submental lymph node excisional biopsy  10/22/2002    Family History:  Family History  Problem Relation Age of Onset  . Heart attack Father   . Diabetes Father     Social History:  reports that she has never smoked. She does not have any smokeless tobacco history on file. She reports that she does not drink alcohol or use illicit drugs.  Allergies:  Allergies  Allergen Reactions  . Ace Inhibitors Cough    Prior to Admission medications   Medication Sig Start Date End Date Taking? Authorizing Provider  amLODipine (NORVASC) 2.5 MG tablet Take 2.5 mg by mouth daily.   Yes Historical Provider, MD  aspirin 81 MG tablet Take 81 mg by mouth daily.     Yes Historical Provider, MD  atorvastatin (LIPITOR) 20 MG tablet Take 20 mg by mouth daily.   Yes Historical Provider, MD  calcium carbonate (TUMS - DOSED IN MG ELEMENTAL CALCIUM) 500 MG chewable tablet Chew 1 tablet by mouth 3 (three) times daily as needed for heartburn.    Yes Historical Provider, MD  Calcium Carbonate-Vitamin D (CALCIUM 600 + D PO) Take 1 tablet by mouth  daily.   Yes Historical Provider, MD  carvedilol (COREG) 6.25 MG tablet Take 6.25 mg by mouth 2 (two) times daily with a meal.   Yes Historical Provider, MD  fluticasone (FLONASE) 50 MCG/ACT nasal spray Place 1 spray into the nose daily as needed for rhinitis or allergies.  05/22/11  Yes Historical Provider, MD  furosemide (LASIX) 20 MG tablet Take 20 mg by mouth daily.   Yes Historical Provider, MD  losartan (COZAAR) 100 MG tablet Take 100 mg by mouth daily.   Yes Historical Provider, MD  metFORMIN (GLUCOPHAGE) 500 MG tablet Take 1 tablet (500 mg total) by mouth 1 day or 1 dose. 11/12/11  Yes Collene Gobble, MD  potassium chloride SA (K-DUR,KLOR-CON) 20 MEQ tablet Take 20 mEq by mouth daily.   Yes Historical Provider, MD  sertraline (ZOLOFT) 50 MG tablet Take 50 mg by mouth daily.   Yes Historical Provider, MD  spironolactone (ALDACTONE) 25 MG tablet Take 1 tablet (25 mg total) by mouth daily. 06/09/12  Yes Gaylord Shih, MD  vitamin E 400 UNIT capsule Take 400 Units by mouth daily.     Yes Historical Provider, MD    Results for orders placed during the hospital encounter of 11/19/12 (from the past  48 hour(s))  TROPONIN I     Status: None   Collection Time    11/20/12 12:30 AM      Result Value Range   Troponin I <0.30  <0.30 ng/mL   Comment:            Due to the release kinetics of cTnI,     a negative result within the first hours     of the onset of symptoms does not rule out     myocardial infarction with certainty.     If myocardial infarction is still suspected,     repeat the test at appropriate intervals.  CBC     Status: Abnormal   Collection Time    11/20/12  6:55 AM      Result Value Range   WBC 3.9 (*) 4.0 - 10.5 K/uL   RBC 3.25 (*) 3.87 - 5.11 MIL/uL   Hemoglobin 10.9 (*) 12.0 - 15.0 g/dL   HCT 40.9 (*) 81.1 - 91.4 %   MCV 96.6  78.0 - 100.0 fL   MCH 33.5  26.0 - 34.0 pg   MCHC 34.7  30.0 - 36.0 g/dL   RDW 78.2  95.6 - 21.3 %   Platelets 47 (*) 150 - 400 K/uL    Comment: CONSISTENT WITH PREVIOUS RESULT  BASIC METABOLIC PANEL     Status: Abnormal   Collection Time    11/20/12  6:55 AM      Result Value Range   Sodium 142  135 - 145 mEq/L   Potassium 3.7  3.5 - 5.1 mEq/L   Chloride 106  96 - 112 mEq/L   CO2 26  19 - 32 mEq/L   Glucose, Bld 103 (*) 70 - 99 mg/dL   BUN 26 (*) 6 - 23 mg/dL   Creatinine, Ser 0.86 (*) 0.50 - 1.10 mg/dL   Calcium 57.8  8.4 - 46.9 mg/dL   GFR calc non Af Amer 30 (*) >90 mL/min   GFR calc Af Amer 35 (*) >90 mL/min   Comment:            The eGFR has been calculated     using the CKD EPI equation.     This calculation has not been     validated in all clinical     situations.     eGFR's persistently     <90 mL/min signify     possible Chronic Kidney Disease.  TROPONIN I     Status: None   Collection Time    11/20/12  6:55 AM      Result Value Range   Troponin I <0.30  <0.30 ng/mL   Comment:            Due to the release kinetics of cTnI,     a negative result within the first hours     of the onset of symptoms does not rule out     myocardial infarction with certainty.     If myocardial infarction is still suspected,     repeat the test at appropriate intervals.    No results found.  Review of Systems: 12 systems reviewed and otherwise negative.  Blood pressure 148/66, pulse 83, temperature 98.4 F (36.9 C), temperature source Oral, resp. rate 16, height 5' (1.524 m), weight 54.205 kg (119 lb 8 oz), SpO2 94.00%. PE - AAOX2++  , FC all 4  CN II_XII intact  CT head 5/2  -   Chronic SDH on left  with some hyperdensity within area that is most likely newer clot -  But no significant change in size of SDH , nor any sig change in the very mild mass effect from August 2012 films  - there is significant atrophy   Assessment/Plan: No surgical intervention indicated at this time and pt is very poor surgical candidate -  Ok to increase activity and even D/C home  -  F/U with Dr. Newell Coral prn (he followed her  previously)  Clydene Fake, MD 11/20/2012, 8:16 AM
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
## 2012-11-20 NOTE — Progress Notes (Signed)
Utilization review completed.  

## 2012-11-21 DIAGNOSIS — I6200 Nontraumatic subdural hemorrhage, unspecified: Secondary | ICD-10-CM

## 2012-11-21 DIAGNOSIS — F29 Unspecified psychosis not due to a substance or known physiological condition: Secondary | ICD-10-CM

## 2012-11-21 LAB — GLUCOSE, CAPILLARY
Glucose-Capillary: 143 mg/dL — ABNORMAL HIGH (ref 70–99)
Glucose-Capillary: 156 mg/dL — ABNORMAL HIGH (ref 70–99)
Glucose-Capillary: 106 mg/dL — ABNORMAL HIGH (ref 70–99)

## 2012-11-21 NOTE — Progress Notes (Signed)
Family Medicine Teaching Service Daily Progress Note Service Pager: [phone]  Subjective: Patient has no complaints today, knows she is in the hospital because she fell but cannot tell me that she has had a bleed.  She is agreeable to SNF with rehab but wants to go back home eventually.   Objective: Vital signs in last 24 hours: Filed Vitals:   11/20/12 2100 11/21/12 0500 11/21/12 0503 11/21/12 0506  BP: 123/49 146/61 145/68 124/63  Pulse: 70 83 93 85  Temp: 97.8 F (36.6 C) 98.7 F (37.1 C)    TempSrc:      Resp: 18 18    Height:      Weight:      SpO2: 98% 98%     Weight change:   Intake/Output Summary (Last 24 hours) at 11/21/12 0814 Last data filed at 11/20/12 2100  Gross per 24 hour  Intake    780 ml  Output      0 ml  Net    780 ml   General appearance: alert, cooperative and no distress Eyes: PERRL, EOMIT Lungs: clear to auscultation bilaterally Heart: regular rate and rhythm, S1, S2 normal, no murmur, click, rub or gallop Neurologic: Grossly normal, CN In tact, upper and lower extremity strength and sensation is normal and equal.  Lab Results:  Recent Labs  11/20/12 0655  WBC 3.9*  HGB 10.9*  HCT 31.4*  PLT 47*    Recent Labs  11/20/12 0655  NA 142  K 3.7  CL 106  CO2 26  GLUCOSE 103*  BUN 26*  CREATININE 1.55*  CALCIUM 10.2   Micro Results: No results found for this or any previous visit (from the past 240 hour(s)). Studies/Results:  Medications: I have reviewed the patient's current medications. Scheduled Meds: . amLODipine  2.5 mg Oral Daily  . atorvastatin  20 mg Oral q1800  . calcium-vitamin D  1 tablet Oral Daily  . carvedilol  6.25 mg Oral BID WC  . insulin aspart  0-9 Units Subcutaneous TID WC  . losartan  100 mg Oral Daily  . metFORMIN  500 mg Oral Q breakfast  . potassium chloride  20 mEq Oral Daily  . sertraline  50 mg Oral Daily  . sodium chloride  3 mL Intravenous Q12H  . spironolactone  25 mg Oral Daily  . vitamin E   400 Units Oral Daily   Continuous Infusions:  PRN Meds:.calcium carbonate, fluticasone Assessment/Plan: Lisa James is a 79 y.o. female patient, PMHx of chronic subdural Hematoma, thrombocytopenia, CAD, Hospital day 2 for fall at home and enlargement of subdural Hematoma:   1: Neuro: patient is at her baseline neurologically, and Neurosurgery feels that subdural Hematoma is stable, no indications for surgery.  - Continue Neuro checks - Continue holding ASA  2: CV; patient had mildly elevated POC Troponin 0.7, but enzymes cycled and have been negative.  Patient denies any chest pain, syncope, palpitations.  - Continue on Tele and monitor BP  3. Deconditioning: Patient lives alone at home and PT has recommended 24 hour supervision, which she does not have.  She is agreeable to temporary SNF placement for rehabilitation, which I agree with given recent fall, Case Management and Social work consulted.   4. Chronic Medical Conditions: Home HTN, DM, HLD medications continued  5: Code Status: Full LLCode  6: FEN/GI: Heart healthy diet, saline lock  7: DVT PPx: No anticoagulation given bleed, SCD's ordered  8: Disposition: Pending placement.   Ardyth Gal, MD PGY3  11/21/2012, 8:14 AM

## 2012-11-21 NOTE — Progress Notes (Signed)
FMTS Attending Note Patient seen and examined by me, discussed with resident team and I agree with Dr Melina Modena note for today.  Patient is oriented to place and time (month, year).  She knows she is in [facility] and tells me she had a bleed in her head.  She lives independently (alone) and is agreeable to short-term SNF for reconditioning following this hospitalization.  She is able to walk with 1-point assist on my exam.  Plan for SNF placement when this can be arranged.  Paula Compton, MD

## 2012-11-21 NOTE — Progress Notes (Addendum)
Clinical Social Work Department CLINICAL SOCIAL WORK PLACEMENT NOTE 11/21/2012  Patient:  Lisa James, Lisa James  Account Number:  [id number] Admit date:  11/19/2012  Clinical Social Worker:  Unk Lightning, LCSW  Date/time:  11/21/2012 10:00 AM  Clinical Social Work is seeking post-discharge placement for this patient at the following level of care:   SKILLED NURSING   (*CSW will update this form in Epic as items are completed)   11/21/2012  Patient/family provided with [facility] Department of Clinical Social Work's list of facilities offering this level of care within the geographic area requested by the patient (or if unable, by the patient's family).  11/21/2012  Patient/family informed of their freedom to choose among providers that offer the needed level of care, that participate in Medicare, Medicaid or managed care program needed by the patient, have an available bed and are willing to accept the patient.  11/21/2012  Patient/family informed of [facility]' ownership interest in [facility], as well as of the fact that they are under no obligation to receive care at this facility.  PASARR submitted to [employer] on existing # PASARR number received from [employer] on   FL2 transmitted to all facilities in geographic area requested by pt/family on  11/21/2012 FL2 transmitted to all facilities within larger geographic area on   Patient informed that his/her managed care company has contracts with or will negotiate with  certain facilities, including the following:     Patient/family informed of bed offers received:   Patient chooses bed at [facility].      Patient to be transferred to [facility] on 11/23/2012.  Patient to be transferred to facility by   Additional Comments:   Sherald Barge, LCSW-A Clinical Social Worker [phone]

## 2012-11-21 NOTE — Progress Notes (Signed)
Clinical Social Work Department BRIEF PSYCHOSOCIAL ASSESSMENT 11/21/2012  Patient:  Lisa James, Lisa James     Account Number:  [id number]     Admit date:  11/19/2012  Clinical Social Worker:  Dennison Bulla  Date/Time:  11/21/2012 10:00 AM  Referred by:  Physician  Date Referred:  11/21/2012 Referred for  SNF Placement   Other Referral:   Interview type:  Patient Other interview type:    PSYCHOSOCIAL DATA Living Status:  ALONE Admitted from facility:   Level of care:   Primary support name:  Loraine Leriche Primary support relationship to patient:  CHILD, ADULT Degree of support available:   Unknown at this time    CURRENT CONCERNS Current Concerns  Post-Acute Placement   Other Concerns:    SOCIAL WORK ASSESSMENT / PLAN CSW received referral to complete psychosocial assessment and to assist with dc planing. Per chart review, patient lives alone and MD has concerns with her returning home alone.    CSW met with patient at bedside. Patient reports that she has considered SNF options and would prefer [facility] since her sister-in-law has stayed there in the past. CSW explained SNF process along with Medicare benefits and recommended [facility] search in case [facility] was not available. Patient agreeable to plan.    CSW completed FL2 and faxed out. CSW will follow up with bed offers.   Assessment/plan status:  Psychosocial Support/Ongoing Assessment of Needs Other assessment/ plan:   Information/referral to community resources:   SNF list    PATIENT'S/FAMILY'S RESPONSE TO PLAN OF CARE: Patient alert and engaged throughout assessment. Patient thanked CSW for time and pleasant throughout assessment.       Weekend Coverage

## 2012-11-22 LAB — BASIC METABOLIC PANEL
BUN: 25 mg/dL — ABNORMAL HIGH (ref 6–23)
Calcium: 8.9 mg/dL (ref 8.4–10.5)
Chloride: 103 mEq/L (ref 96–112)
Creatinine, Ser: 1.54 mg/dL — ABNORMAL HIGH (ref 0.50–1.10)
GFR calc Af Amer: 35 mL/min — ABNORMAL LOW (ref >90)
GFR calc non Af Amer: 30 mL/min — ABNORMAL LOW (ref >90)

## 2012-11-22 LAB — GLUCOSE, CAPILLARY
Glucose-Capillary: 99 mg/dL (ref 70–99)
Glucose-Capillary: 107 mg/dL — ABNORMAL HIGH (ref 70–99)
Glucose-Capillary: 118 mg/dL — ABNORMAL HIGH (ref 70–99)

## 2012-11-22 NOTE — Progress Notes (Signed)
MD notified of rash on lateral aspect of patient's feet.  Patient states this is new for her.  Will continue to monitor. Vincent, Lisa James

## 2012-11-22 NOTE — Progress Notes (Signed)
FMTS Attending Note Patient seen and examined by me today, I agree with Dr Yetta Numbers plan as per his note.  Patient offers no complaints today.  No HA.  Is awaiting placement for short-term SNF. Paula Compton, MD

## 2012-11-22 NOTE — Progress Notes (Signed)
Family Medicine Teaching Service Daily Progress Note Service Pager: [phone]  Subjective:   Objective: Vital signs in last 24 hours: Filed Vitals:   11/22/12 0500 11/22/12 0503 11/22/12 0506 11/22/12 0800  BP: 146/81 126/72 131/73 132/64  Pulse: 76 81 78 72  Temp: 98.3 F (36.8 C)   98.8 F (37.1 C)  TempSrc:    Oral  Resp: 16   14  Height:      Weight:      SpO2: 98%   97%   Weight change:   Intake/Output Summary (Last 24 hours) at 11/22/12 0902 Last data filed at 11/21/12 1800  Gross per 24 hour  Intake    600 ml  Output      0 ml  Net    600 ml   General appearance: alert, cooperative and no distress Eyes: PERRL, EOMIT Lungs: clear to auscultation bilaterally Heart: regular rate and rhythm, S1, S2 normal, no murmur, click, rub or gallop Neurologic: Grossly normal, CN In tact, upper and lower extremity strength and sensation is normal and equal.  Lab Results:  Recent Labs  11/20/12 0655  WBC 3.9*  HGB 10.9*  HCT 31.4*  PLT 47*    Recent Labs  11/20/12 0655  NA 142  K 3.7  CL 106  CO2 26  GLUCOSE 103*  BUN 26*  CREATININE 1.55*  CALCIUM 10.2   Micro Results: No results found for this or any previous visit (from the past 240 hour(s)). Studies/Results:  Medications: I have reviewed the patient's current medications. Scheduled Meds: . amLODipine  2.5 mg Oral Daily  . atorvastatin  20 mg Oral q1800  . calcium-vitamin D  1 tablet Oral Daily  . carvedilol  6.25 mg Oral BID WC  . insulin aspart  0-9 Units Subcutaneous TID WC  . losartan  100 mg Oral Daily  . metFORMIN  500 mg Oral Q breakfast  . potassium chloride  20 mEq Oral Daily  . sertraline  50 mg Oral Daily  . sodium chloride  3 mL Intravenous Q12H  . spironolactone  25 mg Oral Daily  . vitamin E  400 Units Oral Daily   Continuous Infusions:  PRN Meds:.calcium carbonate, fluticasone Assessment/Plan: Lisa James is a 79 y.o. female patient, PMHx of chronic subdural Hematoma,  thrombocytopenia, CAD, Hospital day 3 for fall at home and enlargement of subdural Hematoma:   1: Neuro: patient is at her baseline neurologically, and Neurosurgery feels that subdural Hematoma is stable, no indications for surgery.  - Continue Neuro checks - Continue holding ASA  2: CV; patient had mildly elevated POC Troponin 0.7, but enzymes cycled and have been negative.  Patient denies any chest pain, syncope, palpitations.  - Continue on Tele and monitor BP  3. Deconditioning: Patient lives alone at home and PT has recommended 24 hour supervision, which she does not have.  She is agreeable to temporary SNF placement for rehabilitation, which I agree with given recent fall, Case Management and Social work consulted.   4. Chronic Medical Conditions: Home HTN, DM, HLD, CKD III  - CAD - cont statin and coreg, no ASA - Diastolic CHF - hold furosemide given normal BP and euvolemia, cont spironolactone  - CDK III - borderline CKD IV, re-check creat today  - HTN - cont home coreg and amlodipine - DM - on SSI, stop Metformin if repeat Cr > 1.5 - Depression - cont home sertraline  5: Code Status: Full LLCode  6: FEN/GI: Heart healthy diet,  saline lock  7: DVT PPx: No anticoagulation given bleed, SCD's ordered  8: Disposition: Pending placement.   Mat Carne, MD PGY3 11/22/2012, 9:02 AM

## 2012-11-23 LAB — CBC
MCH: 34.2 pg — ABNORMAL HIGH (ref 26.0–34.0)
MCHC: 35.5 g/dL (ref 30.0–36.0)
MCV: 96.2 fL (ref 78.0–100.0)
Platelets: 63 10*3/uL — ABNORMAL LOW (ref 150–400)
RBC: 3.19 MIL/uL — ABNORMAL LOW (ref 3.87–5.11)

## 2012-11-23 LAB — GLUCOSE, CAPILLARY
Glucose-Capillary: 113 mg/dL — ABNORMAL HIGH (ref 70–99)
Glucose-Capillary: 139 mg/dL — ABNORMAL HIGH (ref 70–99)

## 2012-11-23 MED ORDER — AMLODIPINE BESYLATE 5 MG PO TABS
5.0000 mg | ORAL_TABLET | Freq: Every day | ORAL | Status: DC
  Filled 2012-11-23: qty 1

## 2012-11-23 MED ORDER — AMLODIPINE BESYLATE 2.5 MG PO TABS: 2.5000 mg | ORAL_TABLET | Freq: Every day | ORAL | Status: DC

## 2012-11-23 NOTE — Progress Notes (Signed)
FMTS Attending Admission Note: Lisa Upham,MD I  have seen and examined this patient, reviewed their chart. I have discussed this patient with the resident. I agree with the resident's findings, assessment and care plan.  

## 2012-11-23 NOTE — Discharge Summary (Signed)
Physician Discharge Summary  Patient ID: DONNIS PECHA MRN: [id number] DOB: December 23, 1927 Age: 79 y.o.  Admit date: 11/19/2012 Discharge date: 11/23/2012 Admitting Physician: Hillary Bow, DO  PCP: Lucilla Edin, MD  Consultants: neurosurgery     Discharge Diagnosis: Principal Problem:   Subdural hematoma, acute Active Problems:   HYPERTENSION, BENIGN   SYSTOLIC HEART FAILURE, CHRONIC   Diabetes mellitus   CAD (coronary artery disease)   Chronic kidney disease (CKD), stage III (moderate)    Hospital Course MILLA WAHLBERG is a 79 y.o. female patient, PMHx of chronic subdural Hematoma, thrombocytopenia, CAD, Hospital day 3 for fall at home and enlargement of subdural Hematoma:   1: Neuro: patient presented s/p fall in which she had expansion of subdural hematoma from 4mm to 6 mm. Seen by neurosurgery in the ED and felt there was no intervention to be done. Patient is at her baseline neurologically, and Neurosurgery feels that subdural Hematoma is stable, no indications for surgery. ASA was held. PT evaluated patient and felt home health PT with 24 hour supervision, this could not be arranged, so patient is going to SNF with PT.  2: CV: patient had mildly elevated POC Troponin 0.7, but enzymes cycled and were negative. Patient denies any chest pain, syncope, palpitations. Monitored on telemetry.    3. Deconditioning: Patient lives alone at home and PT has recommended 24 hour supervision, which she does not have. She is agreeable to temporary SNF placement for rehabilitation.   4. Chronic Medical Conditions: Home HTN, DM, HLD, CKD III  - CAD - cont statin and coreg, no ASA  - Diastolic CHF - held furosemide given normal BP and euvolemia, cont spironolactone. Lasix restarted at time of discharge.  - CDK III - borderline CKD IV  - HTN - cont home coreg and increased norvasc to 5 mg daily  - DM - on SSI, metformin d/c'd in setting of Cr > 1.5, given A1c 6.3 would likely not start  additional medication at this time  - Depression - cont home sertraline  Problem List 1. Fall 2. Subdural hematoma 3. Deconditioning 4. CAD 5. Diastolic HF  6. HTN 7. DM 8. Depression   Procedures/Imaging:  Ct Head Wo Contrast  11/20/2012   IMPRESSION: 1.  Interval acute hemorrhage into the previously noted chronic left-sided subdural fluid collection, however the diameter of the collection appears grossly unchanged and there is grossly unchanged minimal (approximately 4 mm) of left to right midline shift. Continued attention on follow-up is recommended. 2.  Minimal amount of soft tissue swelling about the high right posterior parietal calvarium without associated fracture or radiopaque foreign body.  3.  Redemonstrated atrophy and microvascular ischemic disease.  These results will be called to the ordering clinician or representative by the Radiologist Assistant, and communication documented in the PACS Dashboard.   Original Report Authenticated By: Tacey Ruiz, MD     Labs  CBC  Recent Labs Lab 11/20/12 0655 11/23/12 0500  WBC 3.9* 5.1  HGB 10.9* 10.9*  HCT 31.4* 30.7*  PLT 47* 63*   BMET  Recent Labs Lab 11/20/12 0655 11/22/12 1022  NA 142 137  K 3.7 4.3  CL 106 103  CO2 26 26  BUN 26* 25*  CREATININE 1.55* 1.54*  CALCIUM 10.2 8.9  GLUCOSE 103* 122*   Results for orders placed during the hospital encounter of 11/19/12 (from the past 72 hour(s))  TROPONIN I     Status: None   Collection Time  11/20/12 12:01 PM      Result Value Range   Troponin I <0.30  <0.30 ng/mL   Comment:            Due to the release kinetics of cTnI,     a negative result within the first hours     of the onset of symptoms does not rule out     myocardial infarction with certainty.     If myocardial infarction is still suspected,     repeat the test at appropriate intervals.  GLUCOSE, CAPILLARY     Status: Abnormal   Collection Time    11/20/12  8:45 PM      Result Value Range    Glucose-Capillary 110 (*) 70 - 99 mg/dL  GLUCOSE, CAPILLARY     Status: Abnormal   Collection Time    11/21/12  7:52 AM      Result Value Range   Glucose-Capillary 106 (*) 70 - 99 mg/dL   Comment 1 Notify RN    GLUCOSE, CAPILLARY     Status: Abnormal   Collection Time    11/21/12 11:58 AM      Result Value Range   Glucose-Capillary 143 (*) 70 - 99 mg/dL  GLUCOSE, CAPILLARY     Status: Abnormal   Collection Time    11/21/12  4:33 PM      Result Value Range   Glucose-Capillary 156 (*) 70 - 99 mg/dL   Comment 1 Notify RN    GLUCOSE, CAPILLARY     Status: Abnormal   Collection Time    11/21/12  8:49 PM      Result Value Range   Glucose-Capillary 106 (*) 70 - 99 mg/dL  GLUCOSE, CAPILLARY     Status: None   Collection Time    11/22/12  7:57 AM      Result Value Range   Glucose-Capillary 99  70 - 99 mg/dL   Comment 1 Notify RN    BASIC METABOLIC PANEL     Status: Abnormal   Collection Time    11/22/12 10:22 AM      Result Value Range   Sodium 137  135 - 145 mEq/L   Potassium 4.3  3.5 - 5.1 mEq/L   Chloride 103  96 - 112 mEq/L   CO2 26  19 - 32 mEq/L   Glucose, Bld 122 (*) 70 - 99 mg/dL   BUN 25 (*) 6 - 23 mg/dL   Creatinine, Ser 4.78 (*) 0.50 - 1.10 mg/dL   Calcium 8.9  8.4 - 29.5 mg/dL   GFR calc non Af Amer 30 (*) >90 mL/min   GFR calc Af Amer 35 (*) >90 mL/min   Comment:            The eGFR has been calculated     using the CKD EPI equation.     This calculation has not been     validated in all clinical     situations.     eGFR's persistently     <90 mL/min signify     possible Chronic Kidney Disease.  GLUCOSE, CAPILLARY     Status: Abnormal   Collection Time    11/22/12 11:31 AM      Result Value Range   Glucose-Capillary 107 (*) 70 - 99 mg/dL   Comment 1 Notify RN    GLUCOSE, CAPILLARY     Status: Abnormal   Collection Time    11/22/12  4:59 PM  Result Value Range   Glucose-Capillary 102 (*) 70 - 99 mg/dL  GLUCOSE, CAPILLARY     Status: Abnormal    Collection Time    11/22/12  9:22 PM      Result Value Range   Glucose-Capillary 118 (*) 70 - 99 mg/dL  CBC     Status: Abnormal   Collection Time    11/23/12  5:00 AM      Result Value Range   WBC 5.1  4.0 - 10.5 K/uL   RBC 3.19 (*) 3.87 - 5.11 MIL/uL   Hemoglobin 10.9 (*) 12.0 - 15.0 g/dL   HCT 78.2 (*) 95.6 - 21.3 %   MCV 96.2  78.0 - 100.0 fL   MCH 34.2 (*) 26.0 - 34.0 pg   MCHC 35.5  30.0 - 36.0 g/dL   RDW 08.6  57.8 - 46.9 %   Platelets 63 (*) 150 - 400 K/uL   Comment: CONSISTENT WITH PREVIOUS RESULT  GLUCOSE, CAPILLARY     Status: Abnormal   Collection Time    11/23/12  7:15 AM      Result Value Range   Glucose-Capillary 113 (*) 70 - 99 mg/dL   Comment 1 Notify RN         Patient condition at time of discharge/disposition: stable  Disposition-SNF   Follow up issues: 1. Do not restart aspirin given risk of bleeding 2. D/c'd metformin in setting of Cr >1.5, chose not to start additional medications due to 79 yo with A1c 6.3 3. F/u fluid status with restarting lasix and being on spironolactone  Discharge follow up:  Follow-up Information   Please follow up. (please f/u with physician at nursing home once discharged)       Future Appointments Provider Department Dept Phone   01/05/2013 4:15 PM Collene Gobble, MD [facility] [phone]       Discharge Instructions: Please refer to Patient Instructions section of EMR for full details.  Patient was counseled important signs and symptoms that should prompt return to medical care, changes in medications, dietary instructions, activity restrictions, and follow up appointments.    Discharge Medications   Medication List    STOP taking these medications       aspirin 81 MG tablet     metFORMIN 500 MG tablet  Commonly known as:  GLUCOPHAGE      TAKE these medications       amLODipine 2.5 MG tablet  Commonly known as:  NORVASC  Take 2.5 mg by mouth daily.     atorvastatin 20 MG tablet   Commonly known as:  LIPITOR  Take 20 mg by mouth daily.     CALCIUM 600 + D PO  Take 1 tablet by mouth daily.     calcium carbonate 500 MG chewable tablet  Commonly known as:  TUMS - dosed in mg elemental calcium  Chew 1 tablet by mouth 3 (three) times daily as needed for heartburn.     carvedilol 6.25 MG tablet  Commonly known as:  COREG  Take 6.25 mg by mouth 2 (two) times daily with a meal.     fluticasone 50 MCG/ACT nasal spray  Commonly known as:  FLONASE  Place 1 spray into the nose daily as needed for rhinitis or allergies.     furosemide 20 MG tablet  Commonly known as:  LASIX  Take 20 mg by mouth daily.     losartan 100 MG tablet  Commonly known as:  COZAAR  Take 100 mg by mouth daily.     potassium chloride SA 20 MEQ tablet  Commonly known as:  K-DUR,KLOR-CON  Take 20 mEq by mouth daily.     sertraline 50 MG tablet  Commonly known as:  ZOLOFT  Take 50 mg by mouth daily.     spironolactone 25 MG tablet  Commonly known as:  ALDACTONE  Take 1 tablet (25 mg total) by mouth daily.     vitamin E 400 UNIT capsule  Take 400 Units by mouth daily.       Marikay Alar, MD of [facility] 11/23/2012 9:19 PM

## 2012-11-23 NOTE — Progress Notes (Signed)
Family Medicine Teaching Service Daily Progress Note Service Pager: [phone]  Subjective: Doing well this morning. No complaints.  Objective: Vital signs in last 24 hours: Filed Vitals:   11/22/12 1600 11/22/12 2000 11/23/12 0000 11/23/12 0400  BP: 136/73 127/75 170/72 156/73  Pulse: 66 64 75 61  Temp: 97.9 F (36.6 C) 98.1 F (36.7 C) 97.7 F (36.5 C) 98.3 F (36.8 C)  TempSrc: Oral     Resp: 18 18 16 16   Height:      Weight:      SpO2: 99% 98% 98% 97%   Weight change:   Intake/Output Summary (Last 24 hours) at 11/23/12 0700 Last data filed at 11/22/12 1300  Gross per 24 hour  Intake    600 ml  Output      0 ml  Net    600 ml   General appearance: alert, cooperative and no distress Eyes: PERRL, EOMIT Lungs: clear to auscultation bilaterally Heart: regular rate and rhythm, S1, S2 normal, no murmur, click, rub or gallop Neurologic: Grossly normal Lab Results:  Recent Labs  11/23/12 0500  WBC 5.1  HGB 10.9*  HCT 30.7*  PLT 63*    Recent Labs  11/22/12 1022  NA 137  K 4.3  CL 103  CO2 26  GLUCOSE 122*  BUN 25*  CREATININE 1.54*  CALCIUM 8.9   Micro Results: No results found for this or any previous visit (from the past 240 hour(s)). Studies/Results:  Medications: I have reviewed the patient's current medications. Scheduled Meds: . amLODipine  2.5 mg Oral Daily  . atorvastatin  20 mg Oral q1800  . calcium-vitamin D  1 tablet Oral Daily  . carvedilol  6.25 mg Oral BID WC  . insulin aspart  0-9 Units Subcutaneous TID WC  . losartan  100 mg Oral Daily  . potassium chloride  20 mEq Oral Daily  . sertraline  50 mg Oral Daily  . sodium chloride  3 mL Intravenous Q12H  . spironolactone  25 mg Oral Daily  . vitamin E  400 Units Oral Daily   Continuous Infusions:  PRN Meds:.calcium carbonate, fluticasone Assessment/Plan: DAESHA INSCO is a 79 y.o. female patient, PMHx of chronic subdural Hematoma, thrombocytopenia, CAD, Hospital day 4 for fall  at home and enlargement of subdural Hematoma:   1: Neuro: patient is at her baseline neurologically, and Neurosurgery feels that subdural Hematoma is stable, no indications for surgery.  - Continue Neuro checks - Continue holding ASA  2: CV: patient had mildly elevated POC Troponin 0.7, but enzymes cycled and have been negative.  Patient denies any chest pain, syncope, palpitations.  - Continue on Tele and monitor BP  3. Deconditioning: Patient lives alone at home and PT has recommended 24 hour supervision, which she does not have.  She is agreeable to temporary SNF placement for rehabilitation, which I agree with given recent fall, Case Management and Social work consulted.   4. Chronic Medical Conditions: Home HTN, DM, HLD, CKD III  - CAD - cont statin and coreg, no ASA - Diastolic CHF - hold furosemide given normal BP and euvolemia, cont spironolactone  - CDK III - borderline CKD IV  - HTN - cont home coreg and increased norvasc to 5 mg daily  - DM - on SSI, metformin d/c'd in setting of Cr > 1.5, given A1c 6.3 would likely not start additional medication at this time - Depression - cont home sertraline  5: Code Status: Full Code  6: FEN/GI:  Heart healthy diet, saline lock  7: DVT PPx: No anticoagulation given bleed, SCD's ordered  8: Disposition: Pending placement.   Marikay Alar, MD PGY3 11/23/2012, 7:00 AM

## 2012-11-23 NOTE — Progress Notes (Signed)
DC orders received.  Patient stable with no S/s of distress.  Medication and discharge information reviewed with patient and patient's grand-niece.  Report called to [facility] and given to [facility].  Patient DC to SNF. [place], Mitzi Hansen

## 2012-11-23 NOTE — Progress Notes (Signed)
Physical Therapy Treatment Patient Details Name: Lisa James MRN: [id number] DOB: 09/19/1927 Today's Date: 11/23/2012 Time: 4098-1191 PT Time Calculation (min): 16 min  PT Assessment / Plan / Recommendation Comments on Treatment Session  pt presents with fall resulting in extension of a subacute SDH.  pt continues with cognitive deficts and mildly unsteady.  Noted CM able to speak to [facility] who states they are not able to provide 24hr A.  pt would benefit from SNF for safest D/C plan.      Follow Up Recommendations  SNF     Does the patient have the potential to tolerate intense rehabilitation     Barriers to Discharge        Equipment Recommendations  Rolling walker with 5" wheels    Recommendations for Other Services    Frequency Min 3X/week   Plan Discharge plan needs to be updated;Frequency remains appropriate    Precautions / Restrictions Precautions Precautions: Fall Restrictions Weight Bearing Restrictions: No   Pertinent Vitals/Pain Denies pain.      Mobility  Bed Mobility Bed Mobility: Not assessed Transfers Transfers: Sit to Stand;Stand to Sit Sit to Stand: 5: Supervision;With upper extremity assist;From chair/3-in-1;With armrests Stand to Sit: 5: Supervision;With upper extremity assist;To chair/3-in-1;With armrests Details for Transfer Assistance: demos good use of UEs.   Ambulation/Gait Ambulation/Gait Assistance: 4: Min guard Ambulation Distance (Feet): 300 Feet Assistive device: None Ambulation/Gait Assistance Details: pt still with decreased balance when cognitively distracted.  No LOB today, but pt does occaionally reach for walls or furniture for support.   Gait Pattern: Step-through pattern;Decreased stride length;Shuffle Stairs: No Wheelchair Mobility Wheelchair Mobility: No    Exercises     PT Diagnosis:    PT Problem List:   PT Treatment Interventions:     PT Goals Acute Rehab PT Goals Time For Goal Achievement: 12/04/12 Potential to  Achieve Goals: Good PT Goal: Sit to Stand - Progress: Progressing toward goal PT Goal: Stand to Sit - Progress: Progressing toward goal PT Goal: Ambulate - Progress: Progressing toward goal  Visit Information  Last PT Received On: 11/23/12 Assistance Needed: +1    Subjective Data  Subjective: Good, I want to take a walk.     Cognition  Cognition Arousal/Alertness: Awake/alert Behavior During Therapy: WFL for tasks assessed/performed Overall Cognitive Status: No family/caregiver present to determine baseline cognitive functioning Area of Impairment: Attention;Memory;Safety/judgement;Awareness;Problem solving;Following commands Current Attention Level: Selective Memory: Decreased short-term memory Following Commands: Follows multi-step commands with increased time Safety/Judgement: Decreased awareness of safety;Decreased awareness of deficits Awareness: Intellectual Problem Solving: Slow processing General Comments: Used room number directional signs for pt to find other rooms and then her own.      Balance  Balance Balance Assessed: No  End of Session PT - End of Session Equipment Utilized During Treatment: Gait belt Activity Tolerance: Patient tolerated treatment well Patient left: in chair;with call bell/phone within reach Nurse Communication: Mobility status   GP     Sunny Schlein, [place] [phone] 11/23/2012, 11:24 AM

## 2012-11-24 ENCOUNTER — Non-Acute Institutional Stay (SKILLED_NURSING_FACILITY): Payer: Medicare Other | Admitting: Internal Medicine

## 2012-11-24 DIAGNOSIS — S065X9A Traumatic subdural hemorrhage with loss of consciousness of unspecified duration, initial encounter: Secondary | ICD-10-CM

## 2012-11-24 DIAGNOSIS — I5022 Chronic systolic (congestive) heart failure: Secondary | ICD-10-CM

## 2012-11-24 DIAGNOSIS — I6200 Nontraumatic subdural hemorrhage, unspecified: Secondary | ICD-10-CM

## 2012-11-24 DIAGNOSIS — E1129 Type 2 diabetes mellitus with other diabetic kidney complication: Secondary | ICD-10-CM

## 2012-11-24 NOTE — Discharge Summary (Signed)
FMTS Attending Admission Note: Lisa Kenealy,MD I  have seen and examined this patient, reviewed their chart. I have discussed this patient with the resident. I agree with the resident's findings, assessment and care plan.  

## 2012-12-02 ENCOUNTER — Telehealth: Payer: Self-pay | Admitting: Hematology & Oncology

## 2012-12-02 NOTE — Telephone Encounter (Signed)
Pt aware of 5-16 appointment °

## 2012-12-04 ENCOUNTER — Ambulatory Visit (HOSPITAL_BASED_OUTPATIENT_CLINIC_OR_DEPARTMENT_OTHER): Payer: Medicare Other | Admitting: Hematology & Oncology

## 2012-12-04 ENCOUNTER — Other Ambulatory Visit (HOSPITAL_BASED_OUTPATIENT_CLINIC_OR_DEPARTMENT_OTHER): Payer: Medicare Other | Admitting: Lab

## 2012-12-04 VITALS — BP 148/53 | HR 59 | Temp 98.1°F | Resp 16 | Ht 60.0 in | Wt 119.0 lb

## 2012-12-04 DIAGNOSIS — D693 Immune thrombocytopenic purpura: Secondary | ICD-10-CM

## 2012-12-04 DIAGNOSIS — D696 Thrombocytopenia, unspecified: Secondary | ICD-10-CM

## 2012-12-04 LAB — CBC WITH DIFFERENTIAL (CANCER CENTER ONLY)
EOS%: 5.1 % (ref 0.0–7.0)
LYMPH%: 14.8 % (ref 14.0–48.0)
MCH: 35.1 pg — ABNORMAL HIGH (ref 26.0–34.0)
MCHC: 35.6 g/dL (ref 32.0–36.0)
MCV: 99 fL (ref 81–101)
MONO%: 10.1 % (ref 0.0–13.0)
NEUT#: 5.4 10*3/uL (ref 1.5–6.5)
Platelets: 78 10*3/uL — ABNORMAL LOW (ref 145–400)

## 2012-12-04 LAB — CHCC SATELLITE - SMEAR

## 2012-12-04 LAB — PREALBUMIN: Prealbumin: 31.3 mg/dL (ref 17.0–34.0)

## 2012-12-04 MED ORDER — DEXAMETHASONE 4 MG PO TABS: ORAL_TABLET | ORAL | Status: DC

## 2012-12-04 MED ORDER — FAMOTIDINE 40 MG PO TABS: 40.0000 mg | ORAL_TABLET | Freq: Two times a day (BID) | ORAL | Status: DC

## 2012-12-04 NOTE — Progress Notes (Signed)
This office note has been dictated.

## 2012-12-05 NOTE — Progress Notes (Signed)
CC:   [name]. [name], M.D. Hewitt Shorts, M.D.  DIAGNOSES: 1. Thrombocytopenia, mild, chronic. 2. Subdural hematoma.  CURRENT THERAPY:  Observation.  INTERIM HISTORY:  Ms. Buntin comes in for followup.  We have not seen her in 2 years.  Unfortunately she sustained another subdural hematoma. She fell.  She had the subdural on the right side.  She has some chronic thrombocytopenia.  Her platelet count really has not changed in 2 years since we last saw her.  She was on aspirin.  She is on baby aspirin.  This was discontinued.  When she was admitted to the hospital her platelet count was 63,000.  Neurosurgery saw her.  They did not feel any intervention was indicated. They did feel, however, that she needed to see [place].  Ms. Coppens is at an assisted living facility.  She is doing well there. She hopefully will be able to go home soon.  She has never had bleeding despite the thrombocytopenia.  PHYSICAL EXAMINATION:  General:  This is an elderly white female who is somewhat petite.  Vital signs:  Show a temperature of 98.1, pulse 59, respiratory rate 16, blood pressure 148/53.  Weight is 119.  Head and neck:  Shows a normocephalic, atraumatic skull.  There are no ocular or oral lesions.  There are no palpable cervical or supraclavicular lymph nodes.  Lungs:  Clear bilaterally.  Cardiac:  Regular rate and rhythm with a normal S1, S2.  There are no murmurs, rubs or bruits.  Abdomen: Soft with good bowel sounds.  There is no palpable abdominal mass. There is no palpable hepatosplenomegaly.  Extremities:  Show no clubbing, cyanosis or edema.  There may be some slight nonpitting edema of the left lower leg.  Skin:  Shows very few scattered ecchymoses. Neurological:  Shows no focal neurological deficits.  LABORATORY STUDIES:  White cell count is 7.7, hemoglobin 12.4, hematocrit 34.8, platelet count 78,000.  Peripheral smear shows no nucleated red blood cells.  There are  no teardrop cells.  She has no schistocytes or spherocytes.  White cells appear normal in morphology and maturation.  There are no atypical lymphocytes.  There are no immature myeloid cells.  There are no hypersegmented polys.  Platelets are mildly decreased in number.  She has a few large platelets.  Platelets are well granulated.  IMPRESSION:  Ms. Schneck is an 79 year old white female with thrombocytopenia.  Again, this is stable.  I still have a hard time believing that the subdural is from her thrombocytopenia.  However, we are sort of "in a bind" with having to do something.  As such, I will put her on some pulse Decadron.  Hopefully, this will not affect her blood sugars.  I will put her on 20 mg a day for 4 days.  I do not think 40 mg a day would be a good dose for her.  I think this might be too much for her.  We will go 4 days each cycle.  We will have 2 weeks in-between treatments.  I want to see her back in about 6 weeks' time.  By then, we should know if this is going to help.  If not, then maybe the next step would be IVIG.  I want to try to avoid Rituxan on her if we can.  She had a bone marrow test done 2 years ago so I do not think we have to repeat this as her blood counts really have not changed in 2  years.    ______________________________ Josph Macho, M.D. PRE/MEDQ  D:  12/04/2012  T:  12/05/2012  Job:  [id number]

## 2012-12-10 DIAGNOSIS — E1129 Type 2 diabetes mellitus with other diabetic kidney complication: Secondary | ICD-10-CM | POA: Insufficient documentation

## 2012-12-10 NOTE — Progress Notes (Signed)
Patient ID: Lisa James, female   DOB: [birth year], 79 y.o.   MRN: [id number]        HISTORY & PHYSICAL  DATE: 11/24/2012   FACILITY: [facility]  LEVEL OF CARE: SNF (31)  ALLERGIES:  Allergies  Allergen Reactions  . Ace Inhibitors Cough    CHIEF COMPLAINT:  Manage subdural hematoma, CHF, and diabetes mellitus.    HISTORY OF PRESENT ILLNESS:  Patient is an 79 year-old, Caucasian female.    SUBDURAL HEMATOMA:  Patient had a fall at home and had expansion of subdural hematoma from 4 mm to 6 mm.  She was seen by Neurosurgery and they felt the subdural hematoma was stable and no indication for surgery.  Aspirin was held.  PT recommended a skilled nursing facility for rehabilitation.   Patient denies headaches, dizziness or visual disturbances.    CHF:The patient does not relate significant weight changes, denies sob, DOE, orthopnea, PNDs, pedal edema, palpitations or chest pain.  CHF remains stable.  No complications form the medications being used.   DM:pt's DM remains stable.  Pt denies polyuria, polydipsia, polyphagia, changes in vision or hypoglycemic episodes.  No complications noted from the medication presently being used.  Last hemoglobin A1c is: 6.3.  PAST MEDICAL HISTORY :  Past Medical History  Diagnosis Date  . Acute MI, lateral wall   . Chronic systolic heart failure   . Ischemic heart disease   . Mitral regurgitation   . HTN (hypertension)   . DM (diabetes mellitus)   . Non Hodgkin's lymphoma     of the throat    PAST SURGICAL HISTORY: Past Surgical History  Procedure Laterality Date  . Coronary artery bypass graft  11/20/2007    x5  . Bone marrow biopsy  11/22/2002    left posterior iliac crest bone marrow biopsy and aspirate  . Submental lymph node excisional biopsy  10/22/2002    SOCIAL HISTORY:  reports that she has never smoked. She does not have any smokeless tobacco history on file. She reports that she does not drink alcohol or use  illicit drugs.  FAMILY HISTORY:  Family History  Problem Relation Age of Onset  . Heart attack Father   . Diabetes Father     CURRENT MEDICATIONS: Reviewed per [facility]  REVIEW OF SYSTEMS:  See HPI otherwise 14 point ROS is negative.  PHYSICAL EXAMINATION  VS:  not recorded   GENERAL: no acute distress, normal body habitus SKIN: warm & dry, no suspicious lesions or rashes, no excessive dryness EYES: conjunctivae normal, sclerae normal, normal eye lids MOUTH/THROAT: lips without lesions,no lesions in the mouth,tongue is without lesions,uvula elevates in midline NECK: supple, trachea midline, no neck masses, no thyroid tenderness, no thyromegaly LYMPHATICS: no LAN in the neck, no supraclavicular LAN RESPIRATORY: breathing is even & unlabored, BS CTAB CARDIAC: RRR, no murmur,no extra heart sounds, no edema GI:  ABDOMEN: abdomen soft, normal BS, no masses, no tenderness  LIVER/SPLEEN: no hepatomegaly, no splenomegaly MUSCULOSKELETAL: HEAD: normal to inspection & palpation BACK: no kyphosis, scoliosis or spinal processes tenderness EXTREMITIES: LEFT UPPER EXTREMITY: full range of motion, normal strength & tone RIGHT UPPER EXTREMITY:  full range of motion, normal strength & tone LEFT LOWER EXTREMITY:  full range of motion, normal strength & tone RIGHT LOWER EXTREMITY:  full range of motion, normal strength & tone PSYCHIATRIC: the patient is alert & oriented to person, affect & behavior appropriate  LABS/RADIOLOGY: Hemoglobin 10.9, white count 5.1, platelets 63.  BUN 25, creatinine 1.54, glucose 122, otherwise BMP normal.    Troponin-I less than 0.03.   CT of the head showed interval acute hemorrhage previously noted, chronic left-sided subdural fluid collection.  However, the diameter of the collection is grossly unchanged and there is grossly unchanged minimal 4 mm of left to right midline shift.    ASSESSMENT/PLAN:  Subdural hematoma.  Stable.  Continue rehabilitation.    CHF.  Well compensated.    Diabetes mellitus with renal complications.  Well controlled.     Chronic kidney disease stage III.  Reassess.    Coronary artery disease.  Stable.   Thrombocytopenia.  Reassess.   Acute blood loss anemia.  Reassess.   I have reviewed patient's medical records received at admission/from hospitalization.  CPT CODE: 40981

## 2012-12-15 ENCOUNTER — Non-Acute Institutional Stay (SKILLED_NURSING_FACILITY): Payer: Medicare Other | Admitting: Adult Health

## 2012-12-15 ENCOUNTER — Other Ambulatory Visit: Payer: Self-pay | Admitting: Cardiology

## 2012-12-15 DIAGNOSIS — S065X9A Traumatic subdural hemorrhage with loss of consciousness of unspecified duration, initial encounter: Secondary | ICD-10-CM

## 2012-12-15 DIAGNOSIS — K219 Gastro-esophageal reflux disease without esophagitis: Secondary | ICD-10-CM

## 2012-12-15 DIAGNOSIS — F329 Major depressive disorder, single episode, unspecified: Secondary | ICD-10-CM

## 2012-12-15 DIAGNOSIS — I6200 Nontraumatic subdural hemorrhage, unspecified: Secondary | ICD-10-CM

## 2012-12-15 DIAGNOSIS — I5022 Chronic systolic (congestive) heart failure: Secondary | ICD-10-CM

## 2012-12-15 DIAGNOSIS — E782 Mixed hyperlipidemia: Secondary | ICD-10-CM

## 2012-12-15 DIAGNOSIS — E1129 Type 2 diabetes mellitus with other diabetic kidney complication: Secondary | ICD-10-CM

## 2012-12-15 DIAGNOSIS — I10 Essential (primary) hypertension: Secondary | ICD-10-CM

## 2012-12-17 ENCOUNTER — Encounter: Payer: Self-pay | Admitting: Adult Health

## 2012-12-17 DIAGNOSIS — K219 Gastro-esophageal reflux disease without esophagitis: Secondary | ICD-10-CM | POA: Insufficient documentation

## 2012-12-17 NOTE — Progress Notes (Signed)
  Subjective:    Patient ID: Lisa James, female    DOB: 07-May-1928, 79 y.o.   MRN: [id number]  HPI This is an 79 year old female who is for discharge home with Home health PT, OT, and Nursing. She has been admitted to [facility] on 10/05/12 from [facility] with principal diagnosis of subdural hematoma, acute. She has been admitted for a short-term rehabilitation.   Review of Systems  Constitutional: Negative.   HENT: Negative.   Eyes: Negative.   Respiratory: Negative for cough and shortness of breath.   Cardiovascular: Negative for leg swelling.  Gastrointestinal: Negative for abdominal pain and abdominal distention.  Endocrine: Negative.   Genitourinary: Negative.   Musculoskeletal: Negative for joint swelling.  Skin: Negative.   Neurological: Negative.   Hematological: Negative for adenopathy. Does not bruise/bleed easily.  Psychiatric/Behavioral: Negative.        Objective:   Physical Exam  Nursing note and vitals reviewed. Constitutional: She is oriented to person, place, and time. She appears well-developed and well-nourished.  HENT:  Head: Normocephalic.  Right Ear: External ear normal.  Left Ear: External ear normal.  Eyes: Conjunctivae and EOM are normal. Pupils are equal, round, and reactive to light.  Neck: Normal range of motion. Neck supple. No thyromegaly present.  Cardiovascular: Normal rate and regular rhythm.   Pulmonary/Chest: Effort normal and breath sounds normal. No respiratory distress.  Abdominal: Soft. Bowel sounds are normal.  Musculoskeletal: Normal range of motion. She exhibits no edema and no tenderness.  Neurological: She is alert and oriented to person, place, and time.  Skin: Skin is warm and dry.  Psychiatric: She has a normal mood and affect. Her behavior is normal. Judgment and thought content normal.    LABS:11/25/12  Bmp nl except creatinine 1.50  Wbc 6.0  hgb 11.4  hct 33.5  Medications reviewed per [facility]     Assessment &  Plan:   GERD (gastroesophageal reflux disease) - stable; continue Pepcid 40 mg 1 tab PO Q D  Type II or unspecified type diabetes mellitus with renal manifestations, not stated as uncontrolled(250.40) - diet controlled  Chronic kidney disease (CKD), stage III (moderate) - stable  Depression - stable; continue Zoloft 50 mg PO Q D   Subdural hematoma - stable; for Home health PT, OT, and Nursing  HYPERLIPIDEMIA TYPE IIB / III - continue Lipitor 20 mg PO Q D  HYPERTENSION, BENIGN - well-controlled; continue Norvasc 2.5 mg PO Q D and Cozaar 100 mg PO Q D   SYSTOLIC HEART FAILURE, CHRONIC - stable; continue Coreg 6.25 mg PO BID, Lasix 20 mg PO Q D and Aldactone 25 mg PO Q D

## 2012-12-26 ENCOUNTER — Ambulatory Visit (INDEPENDENT_AMBULATORY_CARE_PROVIDER_SITE_OTHER): Payer: Medicare Other | Admitting: Emergency Medicine

## 2012-12-26 VITALS — BP 84/43 | HR 59 | Temp 97.7°F | Resp 16 | Ht 60.0 in

## 2012-12-26 DIAGNOSIS — I10 Essential (primary) hypertension: Secondary | ICD-10-CM

## 2012-12-26 DIAGNOSIS — D696 Thrombocytopenia, unspecified: Secondary | ICD-10-CM

## 2012-12-26 DIAGNOSIS — E119 Type 2 diabetes mellitus without complications: Secondary | ICD-10-CM

## 2012-12-26 DIAGNOSIS — S065X9A Traumatic subdural hemorrhage with loss of consciousness of unspecified duration, initial encounter: Secondary | ICD-10-CM

## 2012-12-26 DIAGNOSIS — I6200 Nontraumatic subdural hemorrhage, unspecified: Secondary | ICD-10-CM

## 2012-12-26 DIAGNOSIS — I2510 Atherosclerotic heart disease of native coronary artery without angina pectoris: Secondary | ICD-10-CM

## 2012-12-26 LAB — COMPREHENSIVE METABOLIC PANEL
ALT: 33 U/L (ref 0–35)
AST: 22 U/L (ref 0–37)
Alkaline Phosphatase: 62 U/L (ref 39–117)
CO2: 22 mEq/L (ref 19–32)
Creat: 2.1 mg/dL — ABNORMAL HIGH (ref 0.50–1.10)
Sodium: 129 mEq/L — ABNORMAL LOW (ref 135–145)
Total Bilirubin: 1.4 mg/dL — ABNORMAL HIGH (ref 0.3–1.2)
Total Protein: 5.6 g/dL — ABNORMAL LOW (ref 6.0–8.3)

## 2012-12-26 LAB — POCT CBC
Granulocyte percent: 83.7 %G — AB (ref 37–80)
MCV: 106.4 fL — AB (ref 80–97)
MID (cbc): 0.3 (ref 0–0.9)
POC Granulocyte: 4.3 (ref 2–6.9)
Platelet Count, POC: 57 10*3/uL — AB (ref 142–424)
RBC: 3.87 M/uL — AB (ref 4.04–5.48)
RDW, POC: 12.5 %

## 2012-12-26 LAB — POCT GLYCOSYLATED HEMOGLOBIN (HGB A1C): Hemoglobin A1C: 6.5

## 2012-12-26 LAB — GLUCOSE, POCT (MANUAL RESULT ENTRY): POC Glucose: 171 mg/dl — AB (ref 70–99)

## 2012-12-26 MED ORDER — POTASSIUM CHLORIDE CRYS ER 20 MEQ PO TBCR: 20.0000 meq | EXTENDED_RELEASE_TABLET | Freq: Every day | ORAL | Status: DC

## 2012-12-26 MED ORDER — SERTRALINE HCL 25 MG PO TABS: 25.0000 mg | ORAL_TABLET | Freq: Every day | ORAL | Status: DC

## 2012-12-26 MED ORDER — SPIRONOLACTONE 25 MG PO TABS: 25.0000 mg | ORAL_TABLET | Freq: Every day | ORAL | Status: DC

## 2012-12-26 MED ORDER — CARVEDILOL 6.25 MG PO TABS: 6.2500 mg | ORAL_TABLET | Freq: Two times a day (BID) | ORAL | Status: DC

## 2012-12-26 MED ORDER — ATORVASTATIN CALCIUM 20 MG PO TABS: 20.0000 mg | ORAL_TABLET | Freq: Every day | ORAL | Status: DC

## 2012-12-26 MED ORDER — FUROSEMIDE 20 MG PO TABS: 20.0000 mg | ORAL_TABLET | Freq: Every day | ORAL | Status: DC

## 2012-12-26 MED ORDER — LOSARTAN POTASSIUM 50 MG PO TABS: 50.0000 mg | ORAL_TABLET | Freq: Every day | ORAL | Status: DC

## 2012-12-26 NOTE — Progress Notes (Signed)
  Subjective:    Patient ID: Lisa James, female    DOB: 22-May-1928, 79 y.o.   MRN: [id number]  HPI  79 year old female presents for a follow up for medication refills.  They didn't do the procedure due to platelets being low.  Dr. Myna Hidalgo  put her on Steroids prescribed.  No mention of a follow up CT.  Working on getting her into an assisted living.  She has a complicated history of a subdural hematoma was 4 mm midline shift. Last CPE done one month ago showed acute on chronic hemorrhage. She is continued to be very physically active.    Review of Systems     Objective:   Physical Exam patient looks good. She is alert and cooperative. She is oriented. She is here with her niece who is  her POA.Marland Kitchen Her neck is supple her chest is clear. Heart regular rate no murmurs Results for orders placed in visit on 12/26/12  POCT CBC      Result Value Range   WBC 5.1  4.6 - 10.2 K/uL   Lymph, poc 0.5 (*) 0.6 - 3.4   POC LYMPH PERCENT 10.3  10 - 50 %L   MID (cbc) 0.3  0 - 0.9   POC MID % 6.0  0 - 12 %M   POC Granulocyte 4.3  2 - 6.9   Granulocyte percent 83.7 (*) 37 - 80 %G   RBC 3.87 (*) 4.04 - 5.48 M/uL   Hemoglobin 13.0  12.2 - 16.2 g/dL   HCT, POC 40.9  81.1 - 47.9 %   MCV 106.4 (*) 80 - 97 fL   MCH, POC 33.6 (*) 27 - 31.2 pg   MCHC 31.6 (*) 31.8 - 35.4 g/dL   RDW, POC 91.4     Platelet Count, POC 57 (*) 142 - 424 K/uL   MPV 7.9  0 - 99.8 fL  GLUCOSE, POCT (MANUAL RESULT ENTRY)      Result Value Range   POC Glucose 171 (*) 70 - 99 mg/dl  POCT GLYCOSYLATED HEMOGLOBIN (HGB A1C)      Result Value Range   Hemoglobin A1C 6.5           Assessment & Plan:  Patient's blood pressure is low today. We'll stop her Norvasc and decrease her Cozaar to 50 mg a day. Platelets are down to 57,000 and we'll send a copy of this to Dr. Myna Hidalgo. Repeat CT of the head was ordered because her last CT a month ago showed acute on chronic bleeding. Niece was advised that I did not feel Avyanna should live  alone. she needs to be in assisted living. She is not capable of managing her medications

## 2012-12-26 NOTE — Patient Instructions (Signed)
Please stop your amlodipine. Please decrease your losartan 100 mg to one half tablet a day. Sugar is slightly elevated due to being on steroids but should improve with time. No medications added for sugar control at the present time.

## 2012-12-31 ENCOUNTER — Telehealth: Payer: Self-pay

## 2012-12-31 NOTE — Telephone Encounter (Signed)
Pt's niece states that patient needs a note stating that the doctor advised her that she needs to be placed in an assisted living facility asap, in order for pt to get out of her current lease agreement. Best# [phone] Nolon Stalls)

## 2012-12-31 NOTE — Telephone Encounter (Signed)
Per last OV- Dr. [name] advised that Mrs Mcquitty would be better suited in an assisted living facility. Letter signed. Printed and faxed to [facility] at [phone].

## 2013-01-05 ENCOUNTER — Ambulatory Visit: Payer: Self-pay | Admitting: Emergency Medicine

## 2013-01-11 ENCOUNTER — Telehealth: Payer: Self-pay | Admitting: Hematology & Oncology

## 2013-01-11 NOTE — Telephone Encounter (Signed)
PT MOVED 6-27 TO 7-3

## 2013-01-12 ENCOUNTER — Other Ambulatory Visit: Payer: Self-pay | Admitting: Physician Assistant

## 2013-01-13 ENCOUNTER — Telehealth: Payer: Self-pay

## 2013-01-14 ENCOUNTER — Other Ambulatory Visit: Payer: Medicare Other

## 2013-01-15 ENCOUNTER — Other Ambulatory Visit: Payer: Medicare Other | Admitting: Lab

## 2013-01-15 ENCOUNTER — Ambulatory Visit: Payer: Medicare Other | Admitting: Hematology & Oncology

## 2013-01-19 ENCOUNTER — Other Ambulatory Visit: Payer: Medicare Other

## 2013-01-19 ENCOUNTER — Ambulatory Visit
Admission: RE | Admit: 2013-01-19 | Discharge: 2013-01-19 | Disposition: A | Discharge status: Home / Self Care | Payer: Medicare Other | Source: Ambulatory Visit | Attending: Emergency Medicine | Admitting: Emergency Medicine

## 2013-01-19 DIAGNOSIS — S065X9A Traumatic subdural hemorrhage with loss of consciousness of unspecified duration, initial encounter: Secondary | ICD-10-CM

## 2013-01-21 ENCOUNTER — Other Ambulatory Visit (HOSPITAL_BASED_OUTPATIENT_CLINIC_OR_DEPARTMENT_OTHER): Payer: Medicare Other | Admitting: Lab

## 2013-01-21 ENCOUNTER — Encounter: Payer: Self-pay | Admitting: Hematology & Oncology

## 2013-01-21 ENCOUNTER — Ambulatory Visit (HOSPITAL_BASED_OUTPATIENT_CLINIC_OR_DEPARTMENT_OTHER): Payer: Medicare Other | Admitting: Hematology & Oncology

## 2013-01-21 VITALS — BP 132/58 | HR 58 | Temp 97.5°F | Resp 16 | Ht 61.0 in | Wt 112.0 lb

## 2013-01-21 DIAGNOSIS — D693 Immune thrombocytopenic purpura: Secondary | ICD-10-CM

## 2013-01-21 HISTORY — DX: Immune thrombocytopenic purpura: D69.3

## 2013-01-21 LAB — CBC WITH DIFFERENTIAL (CANCER CENTER ONLY)
BASO%: 0.1 % (ref 0.0–2.0)
EOS%: 2.3 % (ref 0.0–7.0)
HCT: 35.9 % (ref 34.8–46.6)
LYMPH%: 16.1 % (ref 14.0–48.0)
MCH: 34.4 pg — ABNORMAL HIGH (ref 26.0–34.0)
MCHC: 34 g/dL (ref 32.0–36.0)
MCV: 101 fL (ref 81–101)
MONO%: 10.3 % (ref 0.0–13.0)
NEUT%: 71.2 % (ref 39.6–80.0)
Platelets: 59 10*3/uL — ABNORMAL LOW (ref 145–400)
RDW: 11.8 % (ref 11.1–15.7)
WBC: 8.1 10*3/uL (ref 3.9–10.0)

## 2013-01-21 LAB — CMP (CANCER CENTER ONLY)
ALT(SGPT): 18 U/L (ref 10–47)
Alkaline Phosphatase: 67 U/L (ref 26–84)
CO2: 27 mEq/L (ref 18–33)
Creat: 2.6 mg/dl — ABNORMAL HIGH (ref 0.6–1.2)
Sodium: 142 mEq/L (ref 128–145)
Total Bilirubin: 1.1 mg/dl (ref 0.20–1.60)
Total Protein: 6.4 g/dL (ref 6.4–8.1)

## 2013-01-21 NOTE — Progress Notes (Signed)
This office note has been dictated.

## 2013-01-22 NOTE — Progress Notes (Signed)
CC:   Lisa James. Lisa James, M.D. Hewitt Shorts, M.D.  DIAGNOSES: 1. Chronic thrombocytopenia. 2. Subdural hematoma-chronic.  CURRENT THERAPY:  Observation.  INTERIM HISTORY:  [name] comes in for followup.  She is feeling well.  She has had no complaints.  There has been no headache.  She apparently had a another CT scan of the brain done.  This was done on July 1st.  This showed a stable left subdural hematoma.  There was no change in size of this.  She is asymptomatic with this.  She says she is walking every day.  She is eating well.  She has had no problems with bleeding or bruising.  She has had no nausea or vomiting.  There has been no change in bowel or bladder habits.  PHYSICAL EXAMINATION:  General:  This is an elderly white female in no obvious distress.  Vital signs:  Show temperature of 97.5, pulse 58, respiratory rate 16, blood pressure 132/54.  Weight is 112 pounds.  James and neck exam:  Shows a normocephalic, atraumatic skull.  There are no ocular or oral lesions.  There are no palpable cervical or supraclavicular lymph nodes.  Lungs:  Clear bilaterally.  Cardiac exam: Regular rate and rhythm with a normal S1 and S2.  She has a 2/6 systolic ejection murmur.  Abdominal exam:  Soft with good bowel sounds.  There is no fluid wave.  There is no palpable hepatosplenomegaly. Extremities:  Extremities shows somewhat osteoarthritic changes in her joints.  She has good strength bilaterally.  Neurological exam:  Shows no focal neurological deficits.  LABORATORY STUDIES:  White cell count is 8.1, hemoglobin 12.2, hematocrit 35.9, platelet count 59,000.  MCV is 101.  Peripheral smear shows a normochromic, normocytic population of red blood cells.  There are no nucleated red blood cells.  There are no teardrop cells.  I see no rouleaux formation.  There are no schistocytes or spherocytes.  White cells appear normal in morphology and maturation. There are no immature myeloid  cells.  There are no blasts.  There are no blasts.  Platelets are decreased in number.  She has had a few large platelets.  Platelets are well granulated.  IMPRESSION:  [name] is an 79 year old white female with thrombocytopenia.  The problem is that she has this chronic subdural hematoma.  This is relatively stable.  I still just do not think that her low platelets would be the source of her having a subdural bleed.  Her blood smear certainly looks unremarkable.  I suppose that she may have mild chronic immune thrombocytopenia. I do not want to do a bone marrow biopsy on her.  I think if we find myelodysplasia as a cause for the thrombocytopenia, there is nothing we can do regarding this, outside of platelet transfusions.  I think that we will give her a trial of IVIG.  I think this would the simplest thing that we could try for her.  She certainly is not going to tolerate high-dose IVIG over 2 days.  We will probably then just set her up with IVIG for 5 days.  We will have to get her back in about 1 month.    ______________________________ Josph Macho, M.D. PRE/MEDQ  D:  01/21/2013  T:  01/22/2013  Job:  [id number]

## 2013-01-25 ENCOUNTER — Other Ambulatory Visit: Payer: Medicare Other | Admitting: Lab

## 2013-01-25 ENCOUNTER — Ambulatory Visit (HOSPITAL_BASED_OUTPATIENT_CLINIC_OR_DEPARTMENT_OTHER): Payer: Medicare Other

## 2013-01-25 VITALS — BP 143/65 | HR 66 | Temp 96.9°F | Resp 20 | Wt 114.0 lb

## 2013-01-25 DIAGNOSIS — D693 Immune thrombocytopenic purpura: Secondary | ICD-10-CM

## 2013-01-25 MED ORDER — ACETAMINOPHEN 325 MG PO TABS
650.0000 mg | ORAL_TABLET | Freq: Four times a day (QID) | ORAL | Status: DC | PRN
  Administered 2013-01-25: 650 mg via ORAL

## 2013-01-25 MED ORDER — SODIUM CHLORIDE 0.9 % IV SOLN
Freq: Once | INTRAVENOUS | Status: AC
  Administered 2013-01-25: 08:00:00 via INTRAVENOUS

## 2013-01-25 MED ORDER — IMMUNE GLOBULIN (HUMAN) 20 GM/200ML IV SOLN
20.0000 g | Freq: Every day | INTRAVENOUS | Status: DC
  Administered 2013-01-25: 20 g via INTRAVENOUS
  Filled 2013-01-25: qty 200

## 2013-01-25 MED ORDER — METHYLPREDNISOLONE SODIUM SUCC 125 MG IJ SOLR
60.0000 mg | Freq: Once | INTRAMUSCULAR | Status: AC
  Administered 2013-01-25: 11:00:00 via INTRAVENOUS

## 2013-01-25 MED ORDER — FAMOTIDINE IN NACL 20-0.9 MG/50ML-% IV SOLN
20.0000 mg | Freq: Once | INTRAVENOUS | Status: AC
  Administered 2013-01-25: 20 mg via INTRAVENOUS

## 2013-01-25 MED ORDER — DIPHENHYDRAMINE HCL 25 MG PO TABS
25.0000 mg | ORAL_TABLET | Freq: Four times a day (QID) | ORAL | Status: DC | PRN
  Administered 2013-01-25: 25 mg via ORAL
  Filled 2013-01-25: qty 1

## 2013-01-25 NOTE — Patient Instructions (Signed)

## 2013-01-26 ENCOUNTER — Ambulatory Visit (HOSPITAL_BASED_OUTPATIENT_CLINIC_OR_DEPARTMENT_OTHER): Payer: Medicare Other

## 2013-01-26 VITALS — BP 176/77 | HR 70 | Temp 97.1°F | Resp 16

## 2013-01-26 DIAGNOSIS — D693 Immune thrombocytopenic purpura: Secondary | ICD-10-CM

## 2013-01-26 MED ORDER — DIPHENHYDRAMINE HCL 25 MG PO CAPS
25.0000 mg | ORAL_CAPSULE | Freq: Once | ORAL | Status: AC
  Administered 2013-01-26: 25 mg via ORAL

## 2013-01-26 MED ORDER — ACETAMINOPHEN 325 MG PO TABS
650.0000 mg | ORAL_TABLET | Freq: Once | ORAL | Status: AC
  Administered 2013-01-26: 650 mg via ORAL

## 2013-01-26 MED ORDER — IMMUNE GLOBULIN (HUMAN) 20 GM/200ML IV SOLN
20.0000 g | Freq: Every day | INTRAVENOUS | Status: DC
  Administered 2013-01-26: 20 g via INTRAVENOUS
  Filled 2013-01-26: qty 200

## 2013-01-26 MED ORDER — SODIUM CHLORIDE 0.9 % IV SOLN
Freq: Once | INTRAVENOUS | Status: AC
  Administered 2013-01-26: 12:00:00 via INTRAVENOUS

## 2013-01-27 ENCOUNTER — Ambulatory Visit (HOSPITAL_BASED_OUTPATIENT_CLINIC_OR_DEPARTMENT_OTHER): Payer: Medicare Other

## 2013-01-27 ENCOUNTER — Other Ambulatory Visit: Payer: Medicare Other | Admitting: Lab

## 2013-01-27 VITALS — BP 156/73 | HR 57 | Temp 97.0°F | Resp 20

## 2013-01-27 DIAGNOSIS — D693 Immune thrombocytopenic purpura: Secondary | ICD-10-CM

## 2013-01-27 MED ORDER — ACETAMINOPHEN 325 MG PO TABS
650.0000 mg | ORAL_TABLET | Freq: Four times a day (QID) | ORAL | Status: DC | PRN
  Administered 2013-01-27: 650 mg via ORAL

## 2013-01-27 MED ORDER — DIPHENHYDRAMINE HCL 25 MG PO CAPS
25.0000 mg | ORAL_CAPSULE | Freq: Once | ORAL | Status: AC
  Administered 2013-01-27: 25 mg via ORAL

## 2013-01-27 MED ORDER — SODIUM CHLORIDE 0.9 % IV SOLN
Freq: Once | INTRAVENOUS | Status: AC
  Administered 2013-01-27: 09:00:00 via INTRAVENOUS

## 2013-01-27 MED ORDER — IMMUNE GLOBULIN (HUMAN) 20 GM/200ML IV SOLN
20.0000 g | Freq: Every day | INTRAVENOUS | Status: DC
  Administered 2013-01-27: 20 g via INTRAVENOUS
  Filled 2013-01-27: qty 200

## 2013-01-27 NOTE — Patient Instructions (Signed)

## 2013-01-28 ENCOUNTER — Other Ambulatory Visit: Payer: Medicare Other | Admitting: Lab

## 2013-01-28 ENCOUNTER — Ambulatory Visit (HOSPITAL_BASED_OUTPATIENT_CLINIC_OR_DEPARTMENT_OTHER): Payer: Medicare Other

## 2013-01-28 VITALS — BP 163/80 | HR 65 | Temp 96.9°F | Resp 20

## 2013-01-28 DIAGNOSIS — D693 Immune thrombocytopenic purpura: Secondary | ICD-10-CM

## 2013-01-28 MED ORDER — IMMUNE GLOBULIN (HUMAN) 20 GM/200ML IV SOLN
20.0000 g | Freq: Every day | INTRAVENOUS | Status: AC
  Administered 2013-01-28: 20 g via INTRAVENOUS
  Filled 2013-01-28: qty 200

## 2013-01-28 MED ORDER — DIPHENHYDRAMINE HCL 25 MG PO TABS
25.0000 mg | ORAL_TABLET | Freq: Four times a day (QID) | ORAL | Status: DC | PRN
  Administered 2013-01-28: 25 mg via ORAL
  Filled 2013-01-28: qty 1

## 2013-01-28 MED ORDER — SODIUM CHLORIDE 0.9 % IV SOLN
Freq: Once | INTRAVENOUS | Status: AC
  Administered 2013-01-28: 09:00:00 via INTRAVENOUS

## 2013-01-28 MED ORDER — ACETAMINOPHEN 325 MG PO TABS
650.0000 mg | ORAL_TABLET | Freq: Once | ORAL | Status: AC
  Administered 2013-01-28: 650 mg via ORAL

## 2013-01-28 MED ORDER — HEPARIN SOD (PORK) LOCK FLUSH 100 UNIT/ML IV SOLN
500.0000 [IU] | Freq: Once | INTRAVENOUS | Status: AC | PRN
  Filled 2013-01-28: qty 5

## 2013-01-28 NOTE — Addendum Note (Signed)
Addended by: Lacie Draft on: 01/28/2013 12:27 PM   Modules accepted: Orders

## 2013-01-28 NOTE — Patient Instructions (Signed)

## 2013-01-29 ENCOUNTER — Other Ambulatory Visit: Payer: Medicare Other | Admitting: Lab

## 2013-01-29 ENCOUNTER — Ambulatory Visit: Payer: Medicare Other

## 2013-02-05 ENCOUNTER — Other Ambulatory Visit: Payer: Self-pay | Admitting: Emergency Medicine

## 2013-02-06 ENCOUNTER — Ambulatory Visit (INDEPENDENT_AMBULATORY_CARE_PROVIDER_SITE_OTHER): Payer: Medicare Other | Admitting: Emergency Medicine

## 2013-02-06 VITALS — BP 126/78 | HR 75 | Temp 98.0°F | Resp 17 | Ht 60.0 in | Wt 114.0 lb

## 2013-02-06 DIAGNOSIS — L989 Disorder of the skin and subcutaneous tissue, unspecified: Secondary | ICD-10-CM

## 2013-02-06 DIAGNOSIS — E119 Type 2 diabetes mellitus without complications: Secondary | ICD-10-CM

## 2013-02-06 DIAGNOSIS — D696 Thrombocytopenia, unspecified: Secondary | ICD-10-CM

## 2013-02-06 LAB — POCT CBC
Granulocyte percent: 75.3 %G (ref 37–80)
Hemoglobin: 12.6 g/dL (ref 12.2–16.2)
MCH, POC: 33 pg — AB (ref 27–31.2)
MID (cbc): 0.3 (ref 0–0.9)
MPV: 8.1 fL (ref 0–99.8)
POC Granulocyte: 3.4 (ref 2–6.9)
POC MID %: 7.5 %M (ref 0–12)
Platelet Count, POC: 49 10*3/uL — AB (ref 142–424)
RBC: 3.82 M/uL — AB (ref 4.04–5.48)

## 2013-02-06 LAB — GLUCOSE, POCT (MANUAL RESULT ENTRY): POC Glucose: 145 mg/dl — AB (ref 70–99)

## 2013-02-06 NOTE — Progress Notes (Signed)
  Subjective:    Patient ID: Lisa James, female    DOB: 1927/12/15, 79 y.o.   MRN: [id number]  HPI patient with a complicated history. She's recently been admitted to a chronic care facility on [address]. She has a history of a subdural hematoma with mild midline shift which has been stable. She has a history of MI. She has a history of non-Hodgkin's lymphoma. She also suffers from thrombocytopenia and recently had an immunoglobulin infusion. Things have been much better since she was admitted to a chronic care facility. She also has a lesion on her left arm at her daughter's concerned about    Review of Systems     Objective:   Physical Exam patient is alert and cooperative today she is in no distress. Her neck is supple chest is clear to auscultation. Cardiac reveals a 2/6 systolic murmur at the upper left sternal border. Foot exam reveals decreased sensation to filament testing. She has a mild callus over the first MTP joint and also over the left second toe. Neurologically patient is intact. Cranial nerves II through XII are intact. There is no focal weakness of the upper or lower extremities. She is alert and conversant. Results for orders placed in visit on 02/06/13  POCT CBC      Result Value Range   WBC 4.5 (*) 4.6 - 10.2 K/uL   Lymph, poc 0.8  0.6 - 3.4   POC LYMPH PERCENT 17.2  10 - 50 %L   MID (cbc) 0.3  0 - 0.9   POC MID % 7.5  0 - 12 %M   POC Granulocyte 3.4  2 - 6.9   Granulocyte percent 75.3  37 - 80 %G   RBC 3.82 (*) 4.04 - 5.48 M/uL   Hemoglobin 12.6  12.2 - 16.2 g/dL   HCT, POC 56.2  13.0 - 47.9 %   MCV 103.5 (*) 80 - 97 fL   MCH, POC 33.0 (*) 27 - 31.2 pg   MCHC 31.9  31.8 - 35.4 g/dL   RDW, POC 86.5     Platelet Count, POC 49 (*) 142 - 424 K/uL   MPV 8.1  0 - 99.8 fL  GLUCOSE, POCT (MANUAL RESULT ENTRY)      Result Value Range   POC Glucose 145 (*) 70 - 99 mg/dl         Assessment & Plan:  Patient is stable at the present time. We'll check  routine labs today to include a platelet count. She is now in chronic care facilities her medications are being given on schedule and she has access to assistance if she has any difficulty.

## 2013-02-08 NOTE — Telephone Encounter (Signed)
Dr. [name] - there is a refill request for spironolactone for this pt.  Looks like she is also on a potassium supplement, and last K was elevated at 5.3 - do you want her to continue on these?

## 2013-02-09 NOTE — Telephone Encounter (Signed)
Please call the patient's daughter and let her now that we will  stop the spironolactone. because her potassium was elevated on her last check up please be sure copy of this is forwarded to her cardiologist as well as a copy of the blood work that was done 3 weeks ago.

## 2013-02-10 NOTE — Telephone Encounter (Signed)
Patient needs to stop this medication. Her potassium was elevated on her last visit. Please contact her cardiologist that this medication needed to be stopped due to high potassium . Also call patient's daughter and let her know

## 2013-02-12 NOTE — Telephone Encounter (Signed)
LMOM for daughter to [facility] for instr's concerning spironolactone.

## 2013-02-15 ENCOUNTER — Telehealth: Payer: Self-pay | Admitting: *Deleted

## 2013-02-15 NOTE — Telephone Encounter (Signed)
Spoke will Annette (neice) and advised her per Dr. [name] that pt needs to stop the Aldactone, decrease potassium from to , and recheck electrolyte panel in 3-4 weeks.  Also [facility] for [facility] is ready for pickup in drawer at 102 in big envelope.

## 2013-02-16 NOTE — Telephone Encounter (Signed)
Drinda Butts called back and I advised her that I saw that Apolonio Schneiders had already given her info on 02/15/13 (see phone message). Faxed labs to cardiologist's office.

## 2013-02-16 NOTE — Telephone Encounter (Signed)
LMOM to CB. 

## 2013-02-23 ENCOUNTER — Ambulatory Visit: Payer: Medicare Other | Admitting: Hematology & Oncology

## 2013-02-23 ENCOUNTER — Other Ambulatory Visit: Payer: Medicare Other | Admitting: Lab

## 2013-02-25 ENCOUNTER — Telehealth: Payer: Self-pay | Admitting: Hematology & Oncology

## 2013-02-25 ENCOUNTER — Encounter: Payer: Self-pay | Admitting: Hematology & Oncology

## 2013-02-25 ENCOUNTER — Other Ambulatory Visit (HOSPITAL_BASED_OUTPATIENT_CLINIC_OR_DEPARTMENT_OTHER): Payer: Medicare Other | Admitting: Lab

## 2013-02-25 ENCOUNTER — Other Ambulatory Visit (HOSPITAL_COMMUNITY)
Admission: RE | Admit: 2013-02-25 | Discharge: 2013-02-25 | Disposition: A | Discharge status: Home / Self Care | Payer: Medicare Other | Source: Ambulatory Visit | Attending: Hematology & Oncology | Admitting: Hematology & Oncology

## 2013-02-25 ENCOUNTER — Ambulatory Visit (HOSPITAL_BASED_OUTPATIENT_CLINIC_OR_DEPARTMENT_OTHER): Payer: Medicare Other | Admitting: Hematology & Oncology

## 2013-02-25 VITALS — BP 151/57 | HR 61 | Temp 97.8°F | Resp 16 | Ht 60.0 in | Wt 118.0 lb

## 2013-02-25 DIAGNOSIS — D696 Thrombocytopenia, unspecified: Secondary | ICD-10-CM | POA: Insufficient documentation

## 2013-02-25 DIAGNOSIS — C8590 Non-Hodgkin lymphoma, unspecified, unspecified site: Secondary | ICD-10-CM

## 2013-02-25 DIAGNOSIS — D693 Immune thrombocytopenic purpura: Secondary | ICD-10-CM

## 2013-02-25 LAB — CBC WITH DIFFERENTIAL (CANCER CENTER ONLY)
Eosinophils Absolute: 0.1 10*3/uL (ref 0.0–0.5)
LYMPH#: 0.7 10*3/uL — ABNORMAL LOW (ref 0.9–3.3)
MCH: 34.8 pg — ABNORMAL HIGH (ref 26.0–34.0)
MONO%: 11.6 % (ref 0.0–13.0)
NEUT#: 3 10*3/uL (ref 1.5–6.5)
Platelets: 38 10*3/uL — ABNORMAL LOW (ref 145–400)
RBC: 3.45 10*6/uL — ABNORMAL LOW (ref 3.70–5.32)
WBC: 4.3 10*3/uL (ref 3.9–10.0)

## 2013-02-25 LAB — CHCC SATELLITE - SMEAR

## 2013-02-25 NOTE — Progress Notes (Addendum)
02-25-13  11:45 AM,  Patient had a Bone Marrow Biopsy done today in left hip area, performed by Dr. Myna Hidalgo in the [facility] office. Procedure was explained to patient by Dr. Myna Hidalgo, having no further questions consent form was signed by Ruffin Frederick and Dr. Myna Hidalgo. Pt was placed in proper position, draped and site was cleaned. Procedure was proformed without incident and biopsy sample was given to lab technician. Site was again cleaned and D.S.D. applied. Patient was given nourishment and rested for aproximitely 15 to 30 minutes.  Discharge instruction were given to patient  and transportation was called. Patient was d/c and walked to lobby to wait for her transportation .  Lupita Raider LPN

## 2013-02-25 NOTE — Patient Instructions (Signed)
Bone Marrow Biopsy performed today due to ongoing low platelets (38k today) despite the use of IVIG. Expect the results to come back in 5 - 6 business days. There was difficulty in obtaining an aspirate (a specimen of the marrow itself). The bone marrow shows gives a better picture of the cells vs the bone biopsy.  To follow up with Dr Myna Hidalgo in 3 weeks.  [facility] Discharge Instructions for Post Bone Marrow Procedure  Today you had a bone marrow biopsy and aspirate of the left hip   Please keep the pressure dressing in place for at least 24 hours.  Have someone check your dressing periodically for bleeding.  If needed you can reapply a pressure dressing to the site.  Take Tylenol as needed for pain.   IF BLEEDING REOCCURS THAT SHOULD BE REPORTED IMMEDIATELY.  Call the Cancer Center at [phone] if during business hours. Or report to the Emergency Room.          __________________________________________ Nurse's Signature

## 2013-02-25 NOTE — Telephone Encounter (Signed)
Left message for niece to call for next appointment

## 2013-02-26 NOTE — Progress Notes (Signed)
DIAGNOSIS: 1. Thrombocytopenia. 2. Subdural hematoma -- chronic.  CURRENT THERAPY:  The patient is status post 1 cycle of IVIG.  INTERIM HISTORY:  Lisa James comes in for a followup.  She is doing okay.  She tolerated the IVIG well.  She had no complications from this.  She is eating okay.  She is having no problems with bleeding.  There is no headache.  She is not sure when she goes back to see Dr. Newell Coral for followup of her chronic subdural hematoma.  She has had no change in bowel or bladder habits.  Her appetite is doing pretty well.  There have been no rashes.  Overall, her performance status is ECOG 2.  PHYSICAL EXAMINATION:  General:  This is an elderly, petite, white female in no obvious distress.  Vital signs:  Temperature of 97.8, pulse 61, respiratory rate 16, blood pressure 151/57.  Weight is 118.  Head and neck:  Normocephalic, atraumatic skull.  There are no ocular or oral lesions.  There are no palpable cervical or supraclavicular lymph nodes. Lungs:  Clear bilaterally.  Cardiac:  Regular rate and rhythm with a normal S1 and S2.  She has a 2/6 systolic ejection murmur.  Abdomen: Soft.  She has good bowel sounds.  There is no fluid wave.  There is no palpable hepatosplenomegaly.  Extremities:  Show no clubbing, cyanosis or edema.  Neurological exam:  Shows no focal neurological deficits. Skin exam:  Shows no ecchymoses or petechiae.  LABORATORY STUDIES:  White blood cell count is 4.3, hemoglobin 12, hematocrit 34, platelet count 38,000.  MCV is 98.  IMPRESSION:  Lisa James is an 79 year old white female with thrombocytopenia.  This seems to be somewhat progressive.  She is not responding to the IVIG.  I really thought that she would respond to IVIG.  As such, I went ahead and did a bone marrow biopsy on her in the office today.  I tried to do an aspirate, but I could not aspirate out any liquid.  We did get a good bone marrow biopsy core.  It will be very  interesting to see what we are dealing with, with respect to her bone marrow.  We will plan to get her back probably in 2 or 3 weeks.  Again, I think the next step will really be dictated by the results of the bone marrow biopsy.    ______________________________ Josph Macho, M.D. PRE/MEDQ  D:  02/25/2013  T:  02/26/2013  Job:  [id number]

## 2013-02-26 NOTE — Procedures (Signed)
Lisa James was in to see me today.  Because of her nonresponse to IVIG, I thought that we needed to do a bone marrow biopsy on her.  Thankfully, I had some extra time that could do this.  We did this in one of the exam rooms.  I explained to her the nature of the bone marrow biopsy.  We are not going to need any sedation.  We placed onto her right side.  She had the left posterior iliac crest region prepped and draped in sterile fashion.  Five cc of 2% lidocaine was infiltrated under the skin down to the periosteum.  A #11 scalpel was used to make an incision into the skin.  Unfortunately, there is no aspirate needle in the bone marrow tray.  As such, I just used the Jamshidi biopsy needle.  I was in the bone marrow cavity nicely.  I was locked in.  Despite 4 or 5 attempts, I could not aspirate out any liquid.  Therefore, we did get an excellent biopsy core.  We were able to send some of that to cytogenetics.  Despite her thrombocytopenia, there was no bleeding.  We dressed the procedure site sterilely.  She tolerated the procedure well.  I failed to mention that we did do the appropriate time-out procedure, even though we did not require any anesthesia or sedation.    ______________________________ Josph Macho, M.D. PRE/MEDQ  D:  02/25/2013  T:  02/26/2013  Job:  [id number]

## 2013-03-03 ENCOUNTER — Other Ambulatory Visit: Payer: Self-pay | Admitting: Hematology & Oncology

## 2013-03-03 ENCOUNTER — Telehealth: Payer: Self-pay | Admitting: Hematology & Oncology

## 2013-03-03 DIAGNOSIS — D693 Immune thrombocytopenic purpura: Secondary | ICD-10-CM

## 2013-03-03 NOTE — Telephone Encounter (Signed)
Pt aware of 8-14,8-21 and 8-28 lab and inj

## 2013-03-04 ENCOUNTER — Ambulatory Visit (HOSPITAL_BASED_OUTPATIENT_CLINIC_OR_DEPARTMENT_OTHER): Payer: Medicare Other | Admitting: Hematology & Oncology

## 2013-03-04 ENCOUNTER — Ambulatory Visit (HOSPITAL_BASED_OUTPATIENT_CLINIC_OR_DEPARTMENT_OTHER): Payer: Medicare Other

## 2013-03-04 ENCOUNTER — Other Ambulatory Visit (HOSPITAL_BASED_OUTPATIENT_CLINIC_OR_DEPARTMENT_OTHER): Payer: Medicare Other | Admitting: Lab

## 2013-03-04 VITALS — BP 136/72 | HR 62 | Temp 97.2°F | Resp 18

## 2013-03-04 DIAGNOSIS — D693 Immune thrombocytopenic purpura: Secondary | ICD-10-CM

## 2013-03-04 LAB — CBC WITH DIFFERENTIAL (CANCER CENTER ONLY)
BASO#: 0 10*3/uL (ref 0.0–0.2)
Eosinophils Absolute: 0.2 10*3/uL (ref 0.0–0.5)
HGB: 10 g/dL — ABNORMAL LOW (ref 11.6–15.9)
LYMPH%: 13.3 % — ABNORMAL LOW (ref 14.0–48.0)
MCV: 101 fL (ref 81–101)
MONO#: 0.4 10*3/uL (ref 0.1–0.9)
Platelets: 45 10*3/uL — ABNORMAL LOW (ref 145–400)
RBC: 2.9 10*6/uL — ABNORMAL LOW (ref 3.70–5.32)
WBC: 4.7 10*3/uL (ref 3.9–10.0)

## 2013-03-04 MED ORDER — ROMIPLOSTIM 250 MCG ~~LOC~~ SOLR
50.0000 ug | Freq: Once | SUBCUTANEOUS | Status: AC
  Administered 2013-03-04: 50 ug via SUBCUTANEOUS
  Filled 2013-03-04: qty 0.1

## 2013-03-04 NOTE — Progress Notes (Signed)
This office note has been dictated.

## 2013-03-04 NOTE — Patient Instructions (Signed)

## 2013-03-05 ENCOUNTER — Other Ambulatory Visit: Payer: Medicare Other | Admitting: Lab

## 2013-03-05 ENCOUNTER — Ambulatory Visit: Payer: Medicare Other

## 2013-03-05 NOTE — Progress Notes (Signed)
DIAGNOSIS:  Immune thrombocytopenia.  CURRENT THERAPY:  Patient to start Nplate weekly.  INTERIM HISTORY:  Lisa James comes in for a quick visit.  We went ahead and did a bone marrow biopsy on her last week.  We did this because of progressive thrombocytopenia.  She did not respond to IVIG.  The bone marrow report ([id number]) showed a normocellular marrow.  There was a decrease in some megakaryocytes.  Based on this, I felt that we should try Nplate.  She has had a history of a subdural bleed, that we need to try to get her platelet count up.  She has had no bleeding.  There was some oozing after she had her procedure done last week.  She has a dressing on her left gluteal area. I went ahead and took this off.  The procedure site looked intact with a little bit of ecchymosis.  There was no bleeding, so I kept the dressing off and put a Band-Aid on.  Overall, she still feels well.  She has had no nausea or vomiting. There has been no headache.  She has had no dysphagia.  There has been no cough.  PHYSICAL EXAMINATION:  Her vital signs were all stable.  Blood pressure 136/72.  Temperature is 97.2.  Pulse is 62.  Head and neck:  No adenopathy.  Lungs:  Clear bilaterally.  Cardiac:  Regular rate and rhythm with no murmurs, rubs or bruits.  Abdomen:  Soft.  She has good bowel sounds.  There is no palpable hepatosplenomegaly.  Extremities: No clubbing, cyanosis or edema.  She has osteoarthritic changes.  Skin: An ecchymosis on the left gluteal region where I did the bone marrow biopsy last week.  She had no hematomas.  There is no petechia.  LABORATORY STUDIES:  White cell count 4.7, hemoglobin 10, hematocrit 29.3, platelet count is 45,000.  IMPRESSION:  Lisa James is an 79 year old female with immune thrombocytopenia.  We will go ahead and try Nplate on her.  Again, I want try to get her platelet count above 80,000-100,000.  Above this, I would not think that there would be any  issues with her having any bleeding.  Of note, the last CT scan of the head was done back on July 1st.  She had a mixed-component left subdural hematoma.  This appeared to be stable.  We will see if the Nplate works.  We should know in a week or so.  If the Nplate does not work, then the next step might be Rituxan.  Again, we will have Lisa James come back weekly and, hopefully, we will see her platelet count improving.    ______________________________ Josph Macho, M.D. PRE/MEDQ  D:  03/04/2013  T:  03/05/2013  Job:  [id number]

## 2013-03-09 ENCOUNTER — Other Ambulatory Visit: Payer: Self-pay | Admitting: Physician Assistant

## 2013-03-11 ENCOUNTER — Ambulatory Visit (HOSPITAL_BASED_OUTPATIENT_CLINIC_OR_DEPARTMENT_OTHER): Payer: Medicare Other

## 2013-03-11 ENCOUNTER — Other Ambulatory Visit (HOSPITAL_BASED_OUTPATIENT_CLINIC_OR_DEPARTMENT_OTHER): Payer: Medicare Other | Admitting: Lab

## 2013-03-11 VITALS — BP 140/74 | HR 64 | Temp 98.1°F | Resp 18

## 2013-03-11 DIAGNOSIS — D696 Thrombocytopenia, unspecified: Secondary | ICD-10-CM

## 2013-03-11 DIAGNOSIS — D693 Immune thrombocytopenic purpura: Secondary | ICD-10-CM

## 2013-03-11 LAB — CBC WITH DIFFERENTIAL (CANCER CENTER ONLY)
BASO%: 0.2 % (ref 0.0–2.0)
HCT: 31.2 % — ABNORMAL LOW (ref 34.8–46.6)
LYMPH%: 12.5 % — ABNORMAL LOW (ref 14.0–48.0)
MCH: 34.4 pg — ABNORMAL HIGH (ref 26.0–34.0)
MCHC: 33.7 g/dL (ref 32.0–36.0)
MCV: 102 fL — ABNORMAL HIGH (ref 81–101)
MONO%: 10 % (ref 0.0–13.0)
NEUT%: 73.7 % (ref 39.6–80.0)
Platelets: 67 10*3/uL — ABNORMAL LOW (ref 145–400)
RDW: 12.9 % (ref 11.1–15.7)

## 2013-03-11 LAB — CHCC SATELLITE - SMEAR

## 2013-03-11 MED ORDER — ROMIPLOSTIM 250 MCG ~~LOC~~ SOLR: 1.0000 ug/kg | Freq: Once | SUBCUTANEOUS | Status: DC

## 2013-03-11 MED ORDER — ROMIPLOSTIM 250 MCG ~~LOC~~ SOLR
1.0000 ug/kg | Freq: Once | SUBCUTANEOUS | Status: AC
  Administered 2013-03-11: 50 ug via SUBCUTANEOUS
  Filled 2013-03-11: qty 0.1

## 2013-03-11 NOTE — Patient Instructions (Addendum)

## 2013-03-12 ENCOUNTER — Ambulatory Visit: Payer: Medicare Other

## 2013-03-12 ENCOUNTER — Other Ambulatory Visit: Payer: Medicare Other | Admitting: Lab

## 2013-03-17 ENCOUNTER — Encounter: Payer: Self-pay | Admitting: Hematology & Oncology

## 2013-03-18 ENCOUNTER — Other Ambulatory Visit: Payer: Medicare Other | Admitting: Lab

## 2013-03-18 ENCOUNTER — Ambulatory Visit: Payer: Medicare Other

## 2013-03-19 ENCOUNTER — Ambulatory Visit (HOSPITAL_BASED_OUTPATIENT_CLINIC_OR_DEPARTMENT_OTHER): Payer: Medicare Other

## 2013-03-19 ENCOUNTER — Ambulatory Visit (HOSPITAL_BASED_OUTPATIENT_CLINIC_OR_DEPARTMENT_OTHER): Payer: Medicare Other | Admitting: Hematology & Oncology

## 2013-03-19 ENCOUNTER — Ambulatory Visit (HOSPITAL_BASED_OUTPATIENT_CLINIC_OR_DEPARTMENT_OTHER): Payer: Medicare Other | Admitting: Lab

## 2013-03-19 ENCOUNTER — Other Ambulatory Visit: Payer: Medicare Other | Admitting: Lab

## 2013-03-19 ENCOUNTER — Ambulatory Visit: Payer: Medicare Other

## 2013-03-19 VITALS — BP 153/65 | HR 63 | Temp 98.1°F | Resp 14 | Ht 60.0 in | Wt 116.0 lb

## 2013-03-19 DIAGNOSIS — D693 Immune thrombocytopenic purpura: Secondary | ICD-10-CM

## 2013-03-19 LAB — CBC WITH DIFFERENTIAL (CANCER CENTER ONLY)
BASO#: 0 10*3/uL (ref 0.0–0.2)
BASO%: 0.4 % (ref 0.0–2.0)
EOS%: 4.5 % (ref 0.0–7.0)
HCT: 33.8 % — ABNORMAL LOW (ref 34.8–46.6)
HGB: 11.4 g/dL — ABNORMAL LOW (ref 11.6–15.9)
LYMPH#: 0.9 10*3/uL (ref 0.9–3.3)
MCH: 33.9 pg (ref 26.0–34.0)
MCHC: 33.7 g/dL (ref 32.0–36.0)
MONO%: 9.8 % (ref 0.0–13.0)
NEUT#: 3.6 10*3/uL (ref 1.5–6.5)
NEUT%: 68.2 % (ref 39.6–80.0)
RDW: 12.5 % (ref 11.1–15.7)

## 2013-03-19 MED ORDER — ROMIPLOSTIM 250 MCG ~~LOC~~ SOLR
50.0000 ug | Freq: Once | SUBCUTANEOUS | Status: AC
  Administered 2013-03-19: 50 ug via SUBCUTANEOUS
  Filled 2013-03-19: qty 0.1

## 2013-03-19 NOTE — Progress Notes (Signed)
This office note has been dictated.

## 2013-03-19 NOTE — Patient Instructions (Signed)
Romiplostim injection What is this medicine? ROMIPLOSTIM (roe mi PLOE stim) helps your body make more platelets. This medicine is used to treat low platelets caused by chronic idiopathic thrombocytopenic purpura (ITP). This medicine may be used for other purposes; ask your health care provider or pharmacist if you have questions. What should I tell my health care provider before I take this medicine? They need to know if you have any of these conditions: -cancer or myelodysplastic syndrome -low blood counts, like low white cell, platelet, or red cell counts -take medicines that treat or prevent blood clots -an unusual or allergic reaction to romiplostim, mannitol, other medicines, foods, dyes, or preservatives -pregnant or trying to get pregnant -breast-feeding How should I use this medicine? This medicine is for injection under the skin. It is given by a health care professional in a hospital or clinic setting. A special MedGuide will be given to you before your injection. Read this information carefully each time. Talk to your pediatrician regarding the use of this medicine in children. Special care may be needed. Overdosage: If you think you have taken too much of this medicine contact a poison control center or emergency room at once. NOTE: This medicine is only for you. Do not share this medicine with others. What if I miss a dose? It is important not to miss your dose. Call your doctor or health care professional if you are unable to keep an appointment. What may interact with this medicine? Interactions are not expected. This list may not describe all possible interactions. Give your health care provider a list of all the medicines, herbs, non-prescription drugs, or dietary supplements you use. Also tell them if you smoke, drink alcohol, or use illegal drugs. Some items may interact with your medicine. What should I watch for while using this medicine? Your condition will be monitored  carefully while you are receiving this medicine. Visit your prescriber or health care professional for regular checks on your progress and for the needed blood tests. It is important to keep all appointments. What side effects may I notice from receiving this medicine? Side effects that you should report to your doctor or health care professional as soon as possible: -allergic reactions like skin rash, itching or hives, swelling of the face, lips, or tongue -shortness of breath, chest pain, swelling in a leg -unusual bleeding or bruising Side effects that usually do not require medical attention (report to your doctor or health care professional if they continue or are bothersome): -dizziness -headache -muscle aches -pain in arms and legs -stomach pain -trouble sleeping This list may not describe all possible side effects. Call your doctor for medical advice about side effects. You may report side effects to FDA at 1-800-FDA-1088. Where should I keep my medicine? This drug is given in a hospital or clinic and will not be stored at home. NOTE: This sheet is a summary. It may not cover all possible information. If you have questions about this medicine, talk to your doctor, pharmacist, or health care provider.  2012, Elsevier/Gold Standard. (03/07/2008 3:13:04 PM) 

## 2013-03-20 NOTE — Progress Notes (Signed)
CC:   Lisa James. Lisa James, M.D. Lisa James, M.D.  DIAGNOSIS:  Chronic immune thrombocytopenia.  CURRENT THERAPY:  Nplate to maintain platelet count above 100,000.  INTERIM HISTORY:  Lisa James comes in for followup.  She is looking quite good.  She tolerated her first cycle on the 1st week of Nplate. Her platelet count went from 4502 to 67,000.  She has had no problems with bony pain.  She has had no cough or shortness of breath.  She has had no fevers, sweats, or chills.  She is eating well.  There has been no change in bowel or bladder habits.  She says she has had no neurological changes.  She has had history of subdurals.  PHYSICAL EXAMINATION:  General:  On exam, this is a petite, elderly white female in no obvious distress.  Vital Signs:  Showed a temperature of 98.1, pulse 63, respiratory rate 14, blood pressure 153/65.  Weight is 116.  James/Neck:  Exam shows a normocephalic and atraumatic skull. There are no ocular or oral lesions.  There are no palpable cervical or supraclavicular lymph nodes.  Lungs:  The lungs are clear bilaterally. Cardiac:  Regular rate and rhythm with a normal S1 and S2.  There are no murmurs, rubs, or bruits.  Abdomen:  The abdomen is soft.  She has good bowel sounds.  There is no palpable hepatosplenomegaly.  Back:  Exam shows some kyphosis.  There is no tenderness over the spine, ribs, or hips.  Extremities:  Show no clubbing, cyanosis, or edema. Neurological:  Exam shows no focal neurological deficits.  Skin:  No rashes, ecchymosis, or petechia.  LABORATORY STUDIES:  White cell count is 5.3, hemoglobin 11.4, hematocrit 33.8, and platelet count 125.  IMPRESSION:  Lisa James is a very charming 79 year old white female. She has a chronic immune thrombocytopenia.  We did do a bone marrow biopsy on her.  This did not show any obvious bone marrow malignancy.  I am glad that the Nplate is helping her.  I think we can probably go every 2 weeks for  right now for the Nplate. If her platelet count continues to stabilize or improve, then we hopefully can cut back her visits.  I will plan see back in 1 month.  Again, she will come in 2 weeks for her labs and Nplate.    ______________________________ Josph Macho, M.D. PRE/MEDQ  D:  03/19/2013  T:  03/20/2013  Job:  [id number]

## 2013-04-01 ENCOUNTER — Other Ambulatory Visit (HOSPITAL_BASED_OUTPATIENT_CLINIC_OR_DEPARTMENT_OTHER): Payer: Medicare Other | Admitting: Lab

## 2013-04-01 ENCOUNTER — Telehealth: Payer: Self-pay | Admitting: Radiology

## 2013-04-01 ENCOUNTER — Ambulatory Visit (HOSPITAL_BASED_OUTPATIENT_CLINIC_OR_DEPARTMENT_OTHER): Payer: Medicare Other

## 2013-04-01 VITALS — BP 151/61 | HR 61 | Temp 98.2°F

## 2013-04-01 DIAGNOSIS — E1149 Type 2 diabetes mellitus with other diabetic neurological complication: Secondary | ICD-10-CM

## 2013-04-01 DIAGNOSIS — D693 Immune thrombocytopenic purpura: Secondary | ICD-10-CM

## 2013-04-01 LAB — CBC WITH DIFFERENTIAL/PLATELET
Basophils Absolute: 0 10*3/uL (ref 0.0–0.1)
EOS%: 4.2 % (ref 0.0–7.0)
HCT: 35.7 % (ref 34.8–46.6)
HGB: 12.1 g/dL (ref 11.6–15.9)
MCH: 33.2 pg (ref 25.1–34.0)
MONO#: 0.5 10*3/uL (ref 0.1–0.9)
NEUT%: 73.5 % (ref 38.4–76.8)
lymph#: 0.8 10*3/uL — ABNORMAL LOW (ref 0.9–3.3)

## 2013-04-01 MED ORDER — ROMIPLOSTIM 250 MCG ~~LOC~~ SOLR
50.0000 ug | Freq: Once | SUBCUTANEOUS | Status: AC
  Administered 2013-04-01: 50 ug via SUBCUTANEOUS
  Filled 2013-04-01: qty 0.1

## 2013-04-01 NOTE — Telephone Encounter (Signed)
Thank you orders put in her niece is advised.

## 2013-04-01 NOTE — Telephone Encounter (Signed)
She needs a CBC because her platelets are low. She also needs a glucose A1c and comprehensive metabolic panel. Thanks

## 2013-04-01 NOTE — Telephone Encounter (Signed)
Patients niece is bringing her in to the office on Saturday morning to see you,(diabetes recheck) she wants to know if we can order labs, so these can be done as soon as she arrives, since she will be fasting. She needs AIC and CMET, pended these, what else does she need? Is it okay to go ahead and put these in? Please advise.

## 2013-04-03 ENCOUNTER — Ambulatory Visit (INDEPENDENT_AMBULATORY_CARE_PROVIDER_SITE_OTHER): Payer: Medicare Other | Admitting: Emergency Medicine

## 2013-04-03 VITALS — BP 160/70 | HR 77 | Temp 98.5°F | Resp 18 | Wt 117.0 lb

## 2013-04-03 DIAGNOSIS — Z23 Encounter for immunization: Secondary | ICD-10-CM

## 2013-04-03 DIAGNOSIS — E11319 Type 2 diabetes mellitus with unspecified diabetic retinopathy without macular edema: Secondary | ICD-10-CM

## 2013-04-03 DIAGNOSIS — L600 Ingrowing nail: Secondary | ICD-10-CM

## 2013-04-03 DIAGNOSIS — E1139 Type 2 diabetes mellitus with other diabetic ophthalmic complication: Secondary | ICD-10-CM

## 2013-04-03 DIAGNOSIS — E1149 Type 2 diabetes mellitus with other diabetic neurological complication: Secondary | ICD-10-CM

## 2013-04-03 DIAGNOSIS — I2510 Atherosclerotic heart disease of native coronary artery without angina pectoris: Secondary | ICD-10-CM

## 2013-04-03 LAB — POCT CBC
HCT, POC: 38.5 % (ref 37.7–47.9)
Lymph, poc: 0.8 (ref 0.6–3.4)
MCHC: 31.7 g/dL — AB (ref 31.8–35.4)
MCV: 101.4 fL — AB (ref 80–97)
POC LYMPH PERCENT: 15.1 %L (ref 10–50)
RDW, POC: 12.8 %

## 2013-04-03 LAB — COMPREHENSIVE METABOLIC PANEL
BUN: 20 mg/dL (ref 6–23)
CO2: 27 mEq/L (ref 19–32)
Calcium: 9.5 mg/dL (ref 8.4–10.5)
Chloride: 104 mEq/L (ref 96–112)
Creat: 1.47 mg/dL — ABNORMAL HIGH (ref 0.50–1.10)
Glucose, Bld: 132 mg/dL — ABNORMAL HIGH (ref 70–99)

## 2013-04-03 LAB — POCT GLYCOSYLATED HEMOGLOBIN (HGB A1C): Hemoglobin A1C: 5.5

## 2013-04-03 MED ORDER — MUPIROCIN 2 % EX OINT: TOPICAL_OINTMENT | Freq: Three times a day (TID) | CUTANEOUS | Status: DC

## 2013-04-03 MED ORDER — ACETAMINOPHEN 325 MG PO TABS
500.0000 mg | ORAL_TABLET | Freq: Once | ORAL | Status: AC
  Administered 2013-04-03: 500 mg via ORAL

## 2013-04-03 NOTE — Progress Notes (Signed)
  Subjective:    Patient ID: Lisa James, female    DOB: 1928/01/01, 79 y.o.   MRN: [id number]  HPI patient here to follow up on her diabetes. She is doing well at the facility where she is currently living. She's been having have infusions to improve her thrombocytopenia because they dropped significantly. She has not been following her diet well. She has been to the eye doctor and found to have some diabetic retinopathy. She also has had a female person work on her toes at the care facility and has developed redness and swelling around the left great toe.    Review of Systems     Objective:   Physical Exam there is redness and swelling of the left great toe and at the base of the nail. Chest is clear. Heart is regular rate without murmurs. Abdomen is soft and nontender. Extremities are without edema.  Results for orders placed in visit on 04/03/13  POCT GLYCOSYLATED HEMOGLOBIN (HGB A1C)      Result Value Range   Hemoglobin A1C 5.5    POCT CBC      Result Value Range   WBC 5.6  4.6 - 10.2 K/uL   Lymph, poc 0.8  0.6 - 3.4   POC LYMPH PERCENT 15.1  10 - 50 %L   MID (cbc) 0.4  0 - 0.9   POC MID % 6.8  0 - 12 %M   POC Granulocyte 4.4  2 - 6.9   Granulocyte percent 78.1  37 - 80 %G   RBC 3.80 (*) 4.04 - 5.48 M/uL   Hemoglobin 12.2  12.2 - 16.2 g/dL   HCT, POC 78.2  95.6 - 47.9 %   MCV 101.4 (*) 80 - 97 fL   MCH, POC 32.1 (*) 27 - 31.2 pg   MCHC 31.7 (*) 31.8 - 35.4 g/dL   RDW, POC 21.3     Platelet Count, POC 95 (*) 142 - 424 K/uL   MPV 7.6  0 - 99.8 fL       Assessment & Plan:     She is doing very well with current treatment plan. Her blood pressure is up slightly today. Her hemoglobin A1c is 5.5. She apparently had some abnormal areas seen on her recent appointment and needs evaluation of this. She has not been to the cardiologist for a number of years. We'll go ahead and make that referral.. appointment also made with Dr. Lajoyce Corners to check her great toe and she will be using  some Bactroban on that area.

## 2013-04-03 NOTE — Progress Notes (Deleted)
  Subjective:    Patient ID: Lisa James, female    DOB: Oct 06, 1927, 79 y.o.   MRN: [id number]  HPI    Review of Systems     Objective:   Physical Exam    Results for orders placed in visit on 04/03/13  POCT GLYCOSYLATED HEMOGLOBIN (HGB A1C)      Result Value Range   Hemoglobin A1C 5.5    POCT CBC      Result Value Range   WBC 5.6  4.6 - 10.2 K/uL   Lymph, poc 0.8  0.6 - 3.4   POC LYMPH PERCENT 15.1  10 - 50 %L   MID (cbc) 0.4  0 - 0.9   POC MID % 6.8  0 - 12 %M   POC Granulocyte 4.4  2 - 6.9   Granulocyte percent 78.1  37 - 80 %G   RBC 3.80 (*) 4.04 - 5.48 M/uL   Hemoglobin 12.2  12.2 - 16.2 g/dL   HCT, POC 57.8  46.9 - 47.9 %   MCV 101.4 (*) 80 - 97 fL   MCH, POC 32.1 (*) 27 - 31.2 pg   MCHC 31.7 (*) 31.8 - 35.4 g/dL   RDW, POC 62.9     Platelet Count, POC 95 (*) 142 - 424 K/uL   MPV 7.6  0 - 99.8 fL      Assessment & Plan:

## 2013-04-09 ENCOUNTER — Telehealth: Payer: Self-pay | Admitting: Hematology & Oncology

## 2013-04-09 NOTE — Telephone Encounter (Signed)
Faxed application to [employer] for NPLATE financial assistance, per patient and her niece Milana Na).   COPY SCANNED

## 2013-04-13 ENCOUNTER — Encounter: Payer: Self-pay | Admitting: Cardiovascular Disease

## 2013-04-13 ENCOUNTER — Telehealth: Payer: Self-pay

## 2013-04-13 ENCOUNTER — Ambulatory Visit (INDEPENDENT_AMBULATORY_CARE_PROVIDER_SITE_OTHER): Payer: Medicare Other | Admitting: Cardiovascular Disease

## 2013-04-13 VITALS — BP 160/90 | HR 60 | Ht 60.0 in | Wt 118.2 lb

## 2013-04-13 DIAGNOSIS — I2510 Atherosclerotic heart disease of native coronary artery without angina pectoris: Secondary | ICD-10-CM

## 2013-04-13 DIAGNOSIS — R5381 Other malaise: Secondary | ICD-10-CM

## 2013-04-13 DIAGNOSIS — I259 Chronic ischemic heart disease, unspecified: Secondary | ICD-10-CM

## 2013-04-13 DIAGNOSIS — I10 Essential (primary) hypertension: Secondary | ICD-10-CM

## 2013-04-13 DIAGNOSIS — N183 Chronic kidney disease, stage 3 unspecified: Secondary | ICD-10-CM

## 2013-04-13 DIAGNOSIS — R5383 Other fatigue: Secondary | ICD-10-CM

## 2013-04-13 DIAGNOSIS — I080 Rheumatic disorders of both mitral and aortic valves: Secondary | ICD-10-CM

## 2013-04-13 MED ORDER — LOSARTAN POTASSIUM 50 MG PO TABS: ORAL_TABLET | ORAL | Status: DC

## 2013-04-13 NOTE — Patient Instructions (Addendum)
Your physician has requested that you have a lexiscan myoview. For further information please visit [email or web address] Please follow instruction sheet, as given.  Your physician has requested that you have an echocardiogram. Echocardiography is a painless test that uses sound waves to create images of your heart. It provides your doctor with information about the size and shape of your heart and how well your heart's chambers and valves are working. This procedure takes approximately one hour. There are no restrictions for this procedure.  Your physician recommends that you return for lab work fasting  Your physician has recommended you make the following change in your medication: increase the losartan to 1 tablet in the AM and 1/2 tablet in the PM. A new prescription has already been sent to your pharmacy to reflect these changes.  Your physician recommends that you schedule a follow-up appointment in: 4-6 weeks.

## 2013-04-13 NOTE — Telephone Encounter (Signed)
Lisa James WOULD LIKE TO KNOW IF DR DAUB WANTED PT TO CONTINUE TAKING THE ALDACTONE 25MG S OR IS THAT THE ONE HE WANTED HER TO STOP. PLEASE CALL [phone] AND LEAVE MESSAGE     [facility] [address] IF SHE IS TO CONTINUE

## 2013-04-13 NOTE — Telephone Encounter (Signed)
Her potassium has been slightly elevated and I think would be a good idea if she stopped that particular medication .

## 2013-04-13 NOTE — Progress Notes (Signed)
Patient ID: Lisa James, female   DOB: January 25, 1928, 79 y.o.   MRN: [id number]     PATIENT PROFILE:  Lisa James is an 79 year old female who presents to the office today to establish cardiology care with me. Remotely, the patient had been cared for by Dr. Daleen Squibb.   HPI: Lisa. James is an 79 year old female who has a history of hypertension apparently in 2009 underwent in March CABG surgery x5 emergently by Dr. Tyrone Sage following myocardial infarction.. Palate, the patient has a history of hypertension. An echo Doppler study in 2010 showed an ejection fraction in the 40-45% range and apparently there was evidence for mild aortic stenosis. She has additional problems also include a subdural hematoma, a history of immune thrombocytopenia and most recently has been on Nplate therapy in an attempt to maintain her platelet count greater than 1000. She is followed by Dr. Rosemarie Beath for this. There is also mention in the chart of an occlusion and stenosis of carotid artery without mention of cerebral infarction. She last saw Dr. Daleen Squibb several years ago. Lisa. James remains active. [facility] lives [facility]. According to her family member who is here with her today, she remains active. She does dance. She keeps herself busy. She denies recent chest pain. At times and some mild shortness of breath. She is unaware of palpitations.  Past Medical History  Diagnosis Date  . Acute MI, lateral wall   . Chronic systolic heart failure   . Ischemic heart disease   . Mitral regurgitation   . HTN (hypertension)   . DM (diabetes mellitus)   . Non Hodgkin's lymphoma     of the throat  . Immune thrombocytopenic purpura 01/21/2013    Past Surgical History  Procedure Laterality Date  . Coronary artery bypass graft  11/20/2007    x5  . Bone marrow biopsy  11/22/2002    left posterior iliac crest bone marrow biopsy and aspirate  . Submental lymph node excisional biopsy  10/22/2002    Allergies    Allergen Reactions  . Ace Inhibitors Cough    Current Outpatient Prescriptions  Medication Sig Dispense Refill  . atorvastatin (LIPITOR) 20 MG tablet Take 1 tablet (20 mg total) by mouth daily.  30 tablet  11  . calcium carbonate (TUMS - DOSED IN MG ELEMENTAL CALCIUM) 500 MG chewable tablet Chew 1 tablet by mouth daily.      . Calcium Carbonate-Vitamin D (CALCIUM-VITAMIN D) 500-200 MG-UNIT per tablet Take 1 tablet by mouth every morning. CALCIUM 500 MG + VITAMIN D 3 1000 UNITS.      Marland Kitchen carvedilol (COREG) 6.25 MG tablet Take 1 tablet (6.25 mg total) by mouth 2 (two) times daily with a meal.  60 tablet  11  . Cholecalciferol (VITAMIN D) 1000 UNITS capsule Take 1,000 Units by mouth daily.      . furosemide (LASIX) 20 MG tablet Take 1 tablet (20 mg total) by mouth daily.  30 tablet  11  . losartan (COZAAR) 50 MG tablet Take 1 tablet i the AM and 1/2 tablet in the PM  45 tablet  11  . mupirocin ointment (BACTROBAN) 2 % Apply topically 3 (three) times daily.  22 g  0  . potassium chloride SA (K-DUR,KLOR-CON) 20 MEQ tablet Take 1 tablet (20 mEq total) by mouth daily.  30 tablet  11  . sertraline (ZOLOFT) 25 MG tablet Take 1 tablet (25 mg total) by mouth daily.  30 tablet  11  .  spironolactone (ALDACTONE) 25 MG tablet Take 1 tablet (25 mg total) by mouth daily.  30 tablet  0   No current facility-administered medications for this visit.   Facility-Administered Medications Ordered in Other Visits  Medication Dose Route Frequency Provider Last Rate Last Dose  . diphenhydrAMINE (BENADRYL) tablet 25 mg  25 mg Oral Q6H PRN Josph Macho, MD   25 mg at 01/28/13 1610    Socially she is a widow for 10 years. She has 2 children 4 grandchildren and 3 great-grandchildren. 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 Remotely she used to use snuff but quit this 20 years ago. There is no alcohol use. He does walk  Family History  Problem Relation Age of Onset  . Heart attack Father   .  Diabetes Father    Of note, her mother lived until age 7. Father died around 46 years of age.   ROS is negative for fever chills or night sweats. She wears glasses. She denies recent change in vision. She denies wheezing. She does have some mild short of breath her she denies chest pressure. She is unaware of palpitations. She denies presyncope. There is a history of a subdural hematoma. She denies abdominal complaints presently. She denies active bleeding presently. She denies claudication. She denies significant edema. She denies tremors. She denies rash Other system review is negative.    PE BP 160/90  Pulse 60  Ht 5' (1.524 m)  Wt 118 lb 3.2 oz (53.615 kg)  BMI 23.08 kg/m2 Repeat blood pressure was 154/86 General: Alert, oriented, no distress.  Skin: normal turgor, no rashes  HEENT: Normocephalic, atraumatic. Pupils round and reactive; sclera anicteric; Fundi mild arteriolar narrowing. Nose without nasal septal hypertrophy Mouth/Parynx benign; Mallinpatti scale Neck: No JVD, no carotid briuts Mildly kyphotic Lungs: clear to ausculatation and percussion; no wheezing or rales Heart: RRR, s1 s2 normal 2/6 systolic murmur in the aortic region. Mild transmission to carotids Abdomen: soft, nontender; no hepatosplenomehaly, BS+; abdominal aorta nontender and not dilated by palpation. Pulses 2+ Extremities: no clubbinbg cyanosis or edema, Homan's sign negative  Neurologic: grossly nonfocal Psychologic: normal affect and mood   ECG: Sinus rhythm at 60 beats per minute. First-degree block. LVH by voltage. T wave changes in leads 1 and L.  LABS:  BMET    Component Value Date/Time   NA 136 04/03/2013 0900   NA 142 01/21/2013 1157   K 4.8 04/03/2013 0900   K 5.3* 01/21/2013 1157   CL 104 04/03/2013 0900   CL 103 01/21/2013 1157   CO2 27 04/03/2013 0900   CO2 27 01/21/2013 1157   GLUCOSE 132* 04/03/2013 0900   GLUCOSE 123* 01/21/2013 1157   BUN 20 04/03/2013 0900   BUN 32* 01/21/2013 1157    CREATININE 1.47* 04/03/2013 0900   CREATININE 1.54* 11/22/2012 1022   CALCIUM 9.5 04/03/2013 0900   CALCIUM 9.8 01/21/2013 1157   GFRNONAA 30* 11/22/2012 1022   GFRAA 35* 11/22/2012 1022     Hepatic Function Panel     Component Value Date/Time   PROT 6.5 04/03/2013 0900   PROT 6.4 01/21/2013 1157   ALBUMIN 4.1 04/03/2013 0900   AST 20 04/03/2013 0900   AST 24 01/21/2013 1157   ALT 12 04/03/2013 0900   ALT 18 01/21/2013 1157   ALKPHOS 75 04/03/2013 0900   ALKPHOS 67 01/21/2013 1157   BILITOT 0.7 04/03/2013 0900   BILITOT 1.10 01/21/2013 1157   BILIDIR 0.2 06/20/2009 0000  CBC    Component Value Date/Time   WBC 5.6 04/03/2013 0904   WBC 5.9 04/01/2013 1039   WBC 5.3 03/19/2013 1200   WBC 5.1 11/23/2012 0500   RBC 3.80* 04/03/2013 0904   RBC 3.64* 04/01/2013 1039   RBC 3.19* 11/23/2012 0500   RBC 3.39* 05/13/2011 0545   HGB 12.2 04/03/2013 0904   HGB 12.1 04/01/2013 1039   HGB 11.4* 03/19/2013 1200   HGB 10.9* 11/23/2012 0500   HCT 38.5 04/03/2013 0904   HCT 35.7 04/01/2013 1039   HCT 33.8* 03/19/2013 1200   HCT 30.7* 11/23/2012 0500   PLT 104* 04/01/2013 1039   PLT 125* 03/19/2013 1200   PLT 63* 11/23/2012 0500   MCV 101.4* 04/03/2013 0904   MCV 97.9 04/01/2013 1039   MCV 101 03/19/2013 1200   MCV 96.2 11/23/2012 0500   MCH 32.1* 04/03/2013 0904   MCH 33.2 04/01/2013 1039   MCH 33.9 03/19/2013 1200   MCH 34.2* 11/23/2012 0500   MCHC 31.7* 04/03/2013 0904   MCHC 34.0 04/01/2013 1039   MCHC 33.7 03/19/2013 1200   MCHC 35.5 11/23/2012 0500   RDW 13.5 04/01/2013 1039   RDW 12.5 03/19/2013 1200   RDW 13.4 11/23/2012 0500   LYMPHSABS 0.8* 04/01/2013 1039   LYMPHSABS 0.9 03/19/2013 1200   LYMPHSABS 1.2 09/29/2012 1655   MONOABS 0.5 04/01/2013 1039   MONOABS 0.5 09/29/2012 1655   EOSABS 0.2 04/01/2013 1039   EOSABS 0.2 03/19/2013 1200   EOSABS 0.5 09/29/2012 1655   BASOSABS 0.0 04/01/2013 1039   BASOSABS 0.0 03/19/2013 1200   BASOSABS 0.0 09/29/2012 1655     BNP No results found for this basename: probnp    Lipid Panel       Component Value Date/Time   CHOL 114 03/10/2012 1215   TRIG 162* 03/10/2012 1215   HDL 27* 03/10/2012 1215   CHOLHDL 4.2 03/10/2012 1215   VLDL 32 03/10/2012 1215   LDLCALC 55 03/10/2012 1215     RADIOLOGY: No results found.   ASSESSMENT AND PLAN: My impression is that Lisa. James is an 79 year old female who has a history of hypertension, reported history of mild renal insufficiency, who apparently suffered a myocardial infarction in 2009 and one and one superior vascular disease and surgery x5 by Dr. Nydia Bouton. Additional problems have included the development of chronic immune thrombocytopenia for which he currently is under therapy with Dr. Rosemarie Beath. There is a history of a subdural hematoma. Apparently an echo in 2010 suggested mild aortic stenosis. Presently, she is 5 years since her MI and bypass surgery. I'm scheduling her for LexiScan Myoview study to further evaluate graft patency, scar/ischemia. I'm also scheduling her for 2 d echo Doppler study to further evaluate systolic diastolic function and valvular architecture. I will schedule her for complete set of laboratory. I'll see her back in several weeks for followup evaluation and adjustments will be made her medical regimen as necessary pending upon results of laboratory  Lennette Bihari, MD, [facility] 04/13/2013 1:58 PM

## 2013-04-15 ENCOUNTER — Other Ambulatory Visit (HOSPITAL_BASED_OUTPATIENT_CLINIC_OR_DEPARTMENT_OTHER): Payer: Medicare Other | Admitting: Lab

## 2013-04-15 ENCOUNTER — Ambulatory Visit (HOSPITAL_BASED_OUTPATIENT_CLINIC_OR_DEPARTMENT_OTHER): Payer: Medicare Other | Admitting: Hematology & Oncology

## 2013-04-15 ENCOUNTER — Ambulatory Visit (HOSPITAL_BASED_OUTPATIENT_CLINIC_OR_DEPARTMENT_OTHER): Payer: Medicare Other

## 2013-04-15 VITALS — BP 177/61 | HR 57 | Temp 98.2°F | Resp 16 | Ht 60.0 in | Wt 119.0 lb

## 2013-04-15 DIAGNOSIS — D693 Immune thrombocytopenic purpura: Secondary | ICD-10-CM

## 2013-04-15 LAB — CBC WITH DIFFERENTIAL (CANCER CENTER ONLY)
EOS%: 4.6 % (ref 0.0–7.0)
Eosinophils Absolute: 0.3 10*3/uL (ref 0.0–0.5)
LYMPH%: 14.9 % (ref 14.0–48.0)
MCH: 32.7 pg (ref 26.0–34.0)
MCHC: 34 g/dL (ref 32.0–36.0)
MCV: 96 fL (ref 81–101)
MONO%: 7.8 % (ref 0.0–13.0)
NEUT#: 4.8 10*3/uL (ref 1.5–6.5)
Platelets: 71 10*3/uL — ABNORMAL LOW (ref 145–400)
RBC: 3.88 10*6/uL (ref 3.70–5.32)
RDW: 12.1 % (ref 11.1–15.7)

## 2013-04-15 MED ORDER — ROMIPLOSTIM 250 MCG ~~LOC~~ SOLR
80.0000 ug | Freq: Once | SUBCUTANEOUS | Status: AC
  Administered 2013-04-15: 80 ug via SUBCUTANEOUS
  Filled 2013-04-15: qty 0.16

## 2013-04-15 MED ORDER — HEPARIN SOD (PORK) LOCK FLUSH 100 UNIT/ML IV SOLN
250.0000 [IU] | Freq: Once | INTRAVENOUS | Status: DC | PRN
  Filled 2013-04-15: qty 5

## 2013-04-15 NOTE — Progress Notes (Signed)
This office note has been dictated.

## 2013-04-15 NOTE — Patient Instructions (Signed)
Romiplostim injection What is this medicine? ROMIPLOSTIM (roe mi PLOE stim) helps your body make more platelets. This medicine is used to treat low platelets caused by chronic idiopathic thrombocytopenic purpura (ITP). This medicine may be used for other purposes; ask your health care provider or pharmacist if you have questions. What should I tell my health care provider before I take this medicine? They need to know if you have any of these conditions: -cancer or myelodysplastic syndrome -low blood counts, like low white cell, platelet, or red cell counts -take medicines that treat or prevent blood clots -an unusual or allergic reaction to romiplostim, mannitol, other medicines, foods, dyes, or preservatives -pregnant or trying to get pregnant -breast-feeding How should I use this medicine? This medicine is for injection under the skin. It is given by a health care professional in a hospital or clinic setting. A special MedGuide will be given to you before your injection. Read this information carefully each time. Talk to your pediatrician regarding the use of this medicine in children. Special care may be needed. Overdosage: If you think you have taken too much of this medicine contact a poison control center or emergency room at once. NOTE: This medicine is only for you. Do not share this medicine with others. What if I miss a dose? It is important not to miss your dose. Call your doctor or health care professional if you are unable to keep an appointment. What may interact with this medicine? Interactions are not expected. This list may not describe all possible interactions. Give your health care provider a list of all the medicines, herbs, non-prescription drugs, or dietary supplements you use. Also tell them if you smoke, drink alcohol, or use illegal drugs. Some items may interact with your medicine. What should I watch for while using this medicine? Your condition will be monitored  carefully while you are receiving this medicine. Visit your prescriber or health care professional for regular checks on your progress and for the needed blood tests. It is important to keep all appointments. What side effects may I notice from receiving this medicine? Side effects that you should report to your doctor or health care professional as soon as possible: -allergic reactions like skin rash, itching or hives, swelling of the face, lips, or tongue -shortness of breath, chest pain, swelling in a leg -unusual bleeding or bruising Side effects that usually do not require medical attention (report to your doctor or health care professional if they continue or are bothersome): -dizziness -headache -muscle aches -pain in arms and legs -stomach pain -trouble sleeping This list may not describe all possible side effects. Call your doctor for medical advice about side effects. You may report side effects to FDA at 1-800-FDA-1088. Where should I keep my medicine? This drug is given in a hospital or clinic and will not be stored at home. NOTE: This sheet is a summary. It may not cover all possible information. If you have questions about this medicine, talk to your doctor, pharmacist, or health care provider.  2013, Elsevier/Gold Standard. (03/07/2008 3:13:04 PM)  

## 2013-04-15 NOTE — Telephone Encounter (Signed)
LMOM for Lisa James as requested w/Dr Daub's instr's to DC the aldactone.

## 2013-04-16 ENCOUNTER — Encounter: Payer: Self-pay | Admitting: Cardiovascular Disease

## 2013-04-20 NOTE — Progress Notes (Signed)
CC:   Lisa James. Lisa James, M.D. Lisa James, M.D.  DIAGNOSIS:  Chronic immune thrombocytopenia.  CURRENT THERAPY:  Nplate to keep platelet count above 100,000.  INTERIM HISTORY:  Lisa James comes in for followup.  She has actually done quite well.  She has had no problems with bleeding or bruising. there have been no problems with cough or shortness of breath.  Her last Nplate injection was back on 09/11.  Her platelet count on 09/11 was 104.  Again, I am surprised that she got a dose at that time.  She has had no bleeding, as I stated previously.  Her appetite has been good.  She has had no cough or shortness breath. She has had no headache.  PHYSICAL EXAMINATION:  General:  This is a well-developed, well- nourished, elderly female in no obvious distress.  Vital signs: Temperature of 98.2, pulse 57, respiratory rate 18, blood pressure 177/61.  Weight is 119 pounds.  James and neck:  Normocephalic, atraumatic skull.  There are no ocular or oral lesions.  There are no palpable cervical or supraclavicular lymph nodes.  Lungs:  Clear bilaterally.  Cardiac:  Regular rate and rhythm with a normal S1 and S2. There are no murmurs, rubs or bruits.  Abdomen:  Soft.  She has good bowel sounds.  There is no fluid wave.  There is no palpable abdominal mass.  There is no palpable hepatosplenomegaly.  Extremities: Osteoarthritic changes.  She has good muscle tone.  Range of motion of her joints is okay.  Skin:  No rashes.  Neurologic:  No focal neurological deficits.  LABORATORY STUDIES:  White cell count 6.6, hemoglobin 12.7, hematocrit 37.4, platelet count 71,000.  IMPRESSION:  Lisa James is an 79 year old white female with chronic immune thrombocytopenia.  Her platelet count is back down today.  We will go ahead and give her a dose of Nplate.  The dose will be 1.5 mcg/kg.  We will plan to get her back in 2 weeks for another followup.  I will plan to see her back in 6  weeks.   ______________________________ Josph Macho, M.D. PRE/MEDQ  D:  04/15/2013  T:  04/20/2013  Job:  [id number]

## 2013-04-22 ENCOUNTER — Encounter (INDEPENDENT_AMBULATORY_CARE_PROVIDER_SITE_OTHER): Payer: Medicare Other | Admitting: Ophthalmology

## 2013-04-22 DIAGNOSIS — E11319 Type 2 diabetes mellitus with unspecified diabetic retinopathy without macular edema: Secondary | ICD-10-CM

## 2013-04-22 DIAGNOSIS — H35039 Hypertensive retinopathy, unspecified eye: Secondary | ICD-10-CM

## 2013-04-22 DIAGNOSIS — E1139 Type 2 diabetes mellitus with other diabetic ophthalmic complication: Secondary | ICD-10-CM

## 2013-04-22 DIAGNOSIS — H43819 Vitreous degeneration, unspecified eye: Secondary | ICD-10-CM

## 2013-04-22 DIAGNOSIS — H3530 Unspecified macular degeneration: Secondary | ICD-10-CM

## 2013-04-22 DIAGNOSIS — I10 Essential (primary) hypertension: Secondary | ICD-10-CM

## 2013-04-29 ENCOUNTER — Ambulatory Visit (HOSPITAL_BASED_OUTPATIENT_CLINIC_OR_DEPARTMENT_OTHER): Payer: Medicare Other

## 2013-04-29 ENCOUNTER — Other Ambulatory Visit (HOSPITAL_BASED_OUTPATIENT_CLINIC_OR_DEPARTMENT_OTHER): Payer: Medicare Other

## 2013-04-29 VITALS — BP 140/76 | HR 57 | Temp 97.3°F

## 2013-04-29 DIAGNOSIS — D693 Immune thrombocytopenic purpura: Secondary | ICD-10-CM

## 2013-04-29 LAB — CBC WITH DIFFERENTIAL/PLATELET
Basophils Absolute: 0.1 10*3/uL (ref 0.0–0.1)
Eosinophils Absolute: 0.2 10*3/uL (ref 0.0–0.5)
HGB: 12.2 g/dL (ref 11.6–15.9)
MCV: 95.6 fL (ref 79.5–101.0)
MONO#: 0.6 10*3/uL (ref 0.1–0.9)
MONO%: 11.9 % (ref 0.0–14.0)
NEUT#: 3.2 10*3/uL (ref 1.5–6.5)
Platelets: 131 10*3/uL — ABNORMAL LOW (ref 145–400)
RDW: 12.9 % (ref 11.2–14.5)
lymph#: 0.9 10*3/uL (ref 0.9–3.3)

## 2013-04-29 MED ORDER — ROMIPLOSTIM 250 MCG ~~LOC~~ SOLR
80.0000 ug | Freq: Once | SUBCUTANEOUS | Status: AC
  Administered 2013-04-29: 80 ug via SUBCUTANEOUS
  Filled 2013-04-29: qty 0.16

## 2013-05-06 ENCOUNTER — Ambulatory Visit (HOSPITAL_COMMUNITY)
Admission: RE | Admit: 2013-05-06 | Discharge: 2013-05-06 | Disposition: A | Discharge status: Home / Self Care | Payer: Medicare Other | Source: Ambulatory Visit | Attending: Internal Medicine | Admitting: Internal Medicine

## 2013-05-06 DIAGNOSIS — I2510 Atherosclerotic heart disease of native coronary artery without angina pectoris: Secondary | ICD-10-CM

## 2013-05-06 DIAGNOSIS — I10 Essential (primary) hypertension: Secondary | ICD-10-CM | POA: Insufficient documentation

## 2013-05-06 LAB — LIPID PANEL
Cholesterol: 127 mg/dL (ref 0–200)
HDL: 29 mg/dL — ABNORMAL LOW (ref >39)
LDL Cholesterol: 65 mg/dL (ref 0–99)
Total CHOL/HDL Ratio: 4.4 Ratio
Triglycerides: 166 mg/dL — ABNORMAL HIGH (ref <150)
VLDL: 33 mg/dL (ref 0–40)

## 2013-05-06 LAB — COMPREHENSIVE METABOLIC PANEL
Alkaline Phosphatase: 84 U/L (ref 39–117)
BUN: 19 mg/dL (ref 6–23)
Creat: 1.31 mg/dL — ABNORMAL HIGH (ref 0.50–1.10)
Glucose, Bld: 129 mg/dL — ABNORMAL HIGH (ref 70–99)
Potassium: 4.4 mEq/L (ref 3.5–5.3)
Sodium: 136 mEq/L (ref 135–145)
Total Bilirubin: 0.9 mg/dL (ref 0.3–1.2)
Total Protein: 6.7 g/dL (ref 6.0–8.3)

## 2013-05-06 LAB — CBC
HCT: 40.8 % (ref 36.0–46.0)
Hemoglobin: 13.8 g/dL (ref 12.0–15.0)
MCH: 32.1 pg (ref 26.0–34.0)
MCHC: 33.8 g/dL (ref 30.0–36.0)
MCV: 94.9 fL (ref 78.0–100.0)
WBC: 6.6 10*3/uL (ref 4.0–10.5)

## 2013-05-06 MED ORDER — REGADENOSON 0.4 MG/5ML IV SOLN
0.4000 mg | Freq: Once | INTRAVENOUS | Status: AC
  Administered 2013-05-06: 0.4 mg via INTRAVENOUS

## 2013-05-06 MED ORDER — TECHNETIUM TC 99M SESTAMIBI GENERIC - CARDIOLITE
10.9000 | Freq: Once | INTRAVENOUS | Status: AC | PRN
  Administered 2013-05-06: 10.9 via INTRAVENOUS

## 2013-05-06 MED ORDER — TECHNETIUM TC 99M SESTAMIBI GENERIC - CARDIOLITE
32.1000 | Freq: Once | INTRAVENOUS | Status: AC | PRN
  Administered 2013-05-06: 32.1 via INTRAVENOUS

## 2013-05-06 NOTE — Procedures (Addendum)
[facility] [address] [place] [address] [phone]  Cardiology Nuclear Med Study  Lisa James is a 79 y.o. female     MRN : [id number]     DOB: 10/19/1927  Procedure Date: 05/06/2013  Nuclear Med Background Indication for Stress Test:  Graft Patency History:  CAD;MI;CABG X5 11/20/2007;MV REGURG;CHRONIC HEART FAILURE Cardiac Risk Factors: CVA, Family History - CAD, Hypertension, Lipids, NIDDM and PVD  Symptoms:  SOB   Nuclear Pre-Procedure Caffeine/Decaff Intake:  9:00pm NPO After: 7:00am   IV Site: R Antecubital  IV 0.9% NS with Angio Cath:  22g  Chest Size (in):  N/A IV Started by: Emmit Pomfret, RN  Height: 5' (1.524 m)  Cup Size: B  BMI:  Body mass index is 23.05 kg/(m^2). Weight:  118 lb (53.524 kg)   Tech Comments:  N/A    Nuclear Med Study 1 or 2 day study: 1 day  Stress Test Type:  Lexiscan  Order Authorizing Provider:  Benny Lennert   Resting Radionuclide: Technetium 6m Sestamibi  Resting Radionuclide Dose: 10.9 mCi   Stress Radionuclide:  Technetium 51m Sestamibi  Stress Radionuclide Dose: 32.1 mCi           Stress Protocol Rest HR: 60 Stress HR: 86  Rest BP: 186/84 Stress BP: 129/64  Exercise Time (min): n/a METS: n/a          Dose of Adenosine (mg):  n/a Dose of Lexiscan: 0.4 mg  Dose of Atropine (mg): n/a Dose of Dobutamine: n/a mcg/kg/min (at max HR)  Stress Test Technologist: Ernestene Mention, CCT Nuclear Technologist: Gonzella Lex, CNMT   Rest Procedure:  Myocardial perfusion imaging was performed at rest 45 minutes following the intravenous administration of Technetium 32m Sestamibi. Stress Procedure:  The patient received IV Lexiscan 0.4 mg over 15-seconds.  Technetium 96m Sestamibi injected at 30-seconds.  There were no significant changes with Lexiscan.  Quantitative spect images were obtained after a 45 minute delay.  Transient Ischemic Dilatation (Normal <1.22):   0.95 Lung/Heart Ratio (Normal <0.45):  0.25 QGS EDV:  60 ml QGS ESV:  24 ml LV Ejection Fraction: 60%  Rest ECG: NSR with IVCD/left bundle pattern  Stress ECG: No significant change from baseline ECG  QPS Raw Data Images:  Normal; no motion artifact; normal heart/lung ratio. Stress Images:  Normal homogeneous uptake in all areas of the myocardium. Rest Images:  Normal homogeneous uptake in all areas of the myocardium. Subtraction (SDS):  No evidence of ischemia.  Impression Exercise Capacity:  Lexiscan with no exercise. BP Response:  Normal blood pressure response. Clinical Symptoms:  There is dyspnea. ECG Impression:  No significant ECG changes with Lexiscan. Comparison with Prior Nuclear Study: No previous nuclear study performed  Overall Impression:  Normal stress nuclear study.  LV Wall Motion:  NL LV Function; NL Wall Motion; EF 60%  Chrystie Nose, MD, [facility] Board Certified in Nuclear Cardiology Attending Cardiologist [facility]   Chrystie Nose, MD  05/06/2013 1:08 PM

## 2013-05-06 NOTE — Progress Notes (Signed)
2D Echo Performed 05/06/2013    Criag Wicklund, RCS  

## 2013-05-13 ENCOUNTER — Other Ambulatory Visit (HOSPITAL_BASED_OUTPATIENT_CLINIC_OR_DEPARTMENT_OTHER): Payer: Medicare Other

## 2013-05-13 ENCOUNTER — Ambulatory Visit (HOSPITAL_BASED_OUTPATIENT_CLINIC_OR_DEPARTMENT_OTHER): Payer: Medicare Other

## 2013-05-13 VITALS — BP 133/73 | HR 66 | Temp 98.1°F

## 2013-05-13 DIAGNOSIS — D693 Immune thrombocytopenic purpura: Secondary | ICD-10-CM

## 2013-05-13 LAB — CBC WITH DIFFERENTIAL/PLATELET
Eosinophils Absolute: 0.3 10*3/uL (ref 0.0–0.5)
HCT: 39.5 % (ref 34.8–46.6)
HGB: 13.4 g/dL (ref 11.6–15.9)
LYMPH%: 14.4 % (ref 14.0–49.7)
MCHC: 33.9 g/dL (ref 31.5–36.0)
MCV: 93.4 fL (ref 79.5–101.0)
MONO#: 0.7 10*3/uL (ref 0.1–0.9)
MONO%: 8.7 % (ref 0.0–14.0)
NEUT#: 5.4 10*3/uL (ref 1.5–6.5)
NEUT%: 71.7 % (ref 38.4–76.8)
Platelets: 141 10*3/uL — ABNORMAL LOW (ref 145–400)
RBC: 4.23 10*6/uL (ref 3.70–5.45)
RDW: 13.2 % (ref 11.2–14.5)
WBC: 7.5 10*3/uL (ref 3.9–10.3)
lymph#: 1.1 10*3/uL (ref 0.9–3.3)

## 2013-05-13 MED ORDER — ROMIPLOSTIM 250 MCG ~~LOC~~ SOLR
1.0000 ug/kg | Freq: Once | SUBCUTANEOUS | Status: AC
  Administered 2013-05-13: 55 ug via SUBCUTANEOUS
  Filled 2013-05-13: qty 0.11

## 2013-05-13 MED ORDER — ROMIPLOSTIM 250 MCG ~~LOC~~ SOLR: 1.0000 ug/kg | Freq: Once | SUBCUTANEOUS | Status: DC

## 2013-05-13 NOTE — Progress Notes (Signed)
Quick Note:  Results released into mychart. ______ 

## 2013-05-18 ENCOUNTER — Ambulatory Visit (INDEPENDENT_AMBULATORY_CARE_PROVIDER_SITE_OTHER): Payer: Medicare Other | Admitting: Cardiovascular Disease

## 2013-05-18 VITALS — BP 110/70 | HR 60

## 2013-05-18 DIAGNOSIS — I359 Nonrheumatic aortic valve disorder, unspecified: Secondary | ICD-10-CM

## 2013-05-18 DIAGNOSIS — I080 Rheumatic disorders of both mitral and aortic valves: Secondary | ICD-10-CM

## 2013-05-18 DIAGNOSIS — I2510 Atherosclerotic heart disease of native coronary artery without angina pectoris: Secondary | ICD-10-CM

## 2013-05-18 DIAGNOSIS — E119 Type 2 diabetes mellitus without complications: Secondary | ICD-10-CM

## 2013-05-18 DIAGNOSIS — I350 Nonrheumatic aortic (valve) stenosis: Secondary | ICD-10-CM

## 2013-05-18 DIAGNOSIS — N183 Chronic kidney disease, stage 3 unspecified: Secondary | ICD-10-CM

## 2013-05-18 NOTE — Patient Instructions (Signed)
Your physician recommends that you schedule a follow-up appointment in: 1 YEAR. No changes were made in your therapy today. 

## 2013-05-27 ENCOUNTER — Ambulatory Visit (HOSPITAL_BASED_OUTPATIENT_CLINIC_OR_DEPARTMENT_OTHER): Payer: Medicare Other

## 2013-05-27 ENCOUNTER — Ambulatory Visit (HOSPITAL_BASED_OUTPATIENT_CLINIC_OR_DEPARTMENT_OTHER): Payer: Medicare Other | Admitting: Hematology & Oncology

## 2013-05-27 ENCOUNTER — Other Ambulatory Visit (HOSPITAL_BASED_OUTPATIENT_CLINIC_OR_DEPARTMENT_OTHER): Payer: Medicare Other | Admitting: Lab

## 2013-05-27 VITALS — BP 180/51 | HR 60 | Temp 98.3°F | Resp 14 | Ht 60.0 in | Wt 122.0 lb

## 2013-05-27 DIAGNOSIS — D693 Immune thrombocytopenic purpura: Secondary | ICD-10-CM

## 2013-05-27 LAB — CBC WITH DIFFERENTIAL (CANCER CENTER ONLY)
BASO%: 0.3 % (ref 0.0–2.0)
EOS%: 6 % (ref 0.0–7.0)
Eosinophils Absolute: 0.4 10*3/uL (ref 0.0–0.5)
HCT: 37.2 % (ref 34.8–46.6)
HGB: 12.7 g/dL (ref 11.6–15.9)
LYMPH%: 16.7 % (ref 14.0–48.0)
MCHC: 34.1 g/dL (ref 32.0–36.0)
MCV: 94 fL (ref 81–101)
MONO%: 9.4 % (ref 0.0–13.0)
NEUT%: 67.6 % (ref 39.6–80.0)
Platelets: 97 10*3/uL — ABNORMAL LOW (ref 145–400)
RDW: 12.6 % (ref 11.1–15.7)
WBC: 5.9 10*3/uL (ref 3.9–10.0)

## 2013-05-27 MED ORDER — ROMIPLOSTIM 250 MCG ~~LOC~~ SOLR
55.0000 ug | Freq: Once | SUBCUTANEOUS | Status: AC
  Administered 2013-05-27: 55 ug via SUBCUTANEOUS
  Filled 2013-05-27: qty 0.11

## 2013-05-27 NOTE — Progress Notes (Signed)
This office note has been dictated.

## 2013-05-27 NOTE — Patient Instructions (Signed)
Romiplostim injection What is this medicine? ROMIPLOSTIM (roe mi PLOE stim) helps your body make more platelets. This medicine is used to treat low platelets caused by chronic idiopathic thrombocytopenic purpura (ITP). This medicine may be used for other purposes; ask your health care provider or pharmacist if you have questions. COMMON BRAND NAME(S): Nplate What should I tell my health care provider before I take this medicine? They need to know if you have any of these conditions: -cancer or myelodysplastic syndrome -low blood counts, like low white cell, platelet, or red cell counts -take medicines that treat or prevent blood clots -an unusual or allergic reaction to romiplostim, mannitol, other medicines, foods, dyes, or preservatives -pregnant or trying to get pregnant -breast-feeding How should I use this medicine? This medicine is for injection under the skin. It is given by a health care professional in a hospital or clinic setting. A special MedGuide will be given to you before your injection. Read this information carefully each time. Talk to your pediatrician regarding the use of this medicine in children. Special care may be needed. Overdosage: If you think you have taken too much of this medicine contact a poison control center or emergency room at once. NOTE: This medicine is only for you. Do not share this medicine with others. What if I miss a dose? It is important not to miss your dose. Call your doctor or health care professional if you are unable to keep an appointment. What may interact with this medicine? Interactions are not expected. This list may not describe all possible interactions. Give your health care provider a list of all the medicines, herbs, non-prescription drugs, or dietary supplements you use. Also tell them if you smoke, drink alcohol, or use illegal drugs. Some items may interact with your medicine. What should I watch for while using this  medicine? Your condition will be monitored carefully while you are receiving this medicine. Visit your prescriber or health care professional for regular checks on your progress and for the needed blood tests. It is important to keep all appointments. What side effects may I notice from receiving this medicine? Side effects that you should report to your doctor or health care professional as soon as possible: -allergic reactions like skin rash, itching or hives, swelling of the face, lips, or tongue -shortness of breath, chest pain, swelling in a leg -unusual bleeding or bruising Side effects that usually do not require medical attention (report to your doctor or health care professional if they continue or are bothersome): -dizziness -headache -muscle aches -pain in arms and legs -stomach pain -trouble sleeping This list may not describe all possible side effects. Call your doctor for medical advice about side effects. You may report side effects to FDA at 1-800-FDA-1088. Where should I keep my medicine? This drug is given in a hospital or clinic and will not be stored at home. NOTE: This sheet is a summary. It may not cover all possible information. If you have questions about this medicine, talk to your doctor, pharmacist, or health care provider.  2014, Elsevier/Gold Standard. (2008-03-07 15:13:04)  

## 2013-05-29 NOTE — Progress Notes (Signed)
CC:   [name]. [name], M.D. Hewitt Shorts, M.D.  DIAGNOSIS:  Chronic immune thrombocytopenia.  CURRENT THERAPY:  Nplate for platelet count less than 100,000.  INTERIM HISTORY:  Ms. Lisa James comes in for followup.  She is doing fairly well.  She seems to respond well to the Nplate.  She probably has not gotten Nplate now for 2 weeks or so.  Last dose was on 10/23.  She has had no problems with bleeding or bruising.  There has been no change in bowel or bladder habits.  She has had no cough or shortness of breath.  There has been no leg swelling.  PHYSICAL EXAMINATION:  General:  This is a petite, elderly white female, in no obvious distress.  Vital signs:  Temperature of 98.3, pulse 60, respiratory rate 14, blood pressure 180/51.  Weight is 122 pounds.  Head and neck:  Normocephalic, atraumatic skull.  There are no ocular or oral lesions.  There are no palpable cervical or supraclavicular lymph nodes. Lungs:  Clear bilaterally.  Cardiac:  Regular rate and rhythm with normal S1 and S2.  She has a 2/6 systolic ejection murmur.  Abdomen: Soft.  She has good bowel sounds.  There is no fluid wave.  There is no palpable hepatosplenomegaly.  Extremities:  No clubbing, cyanosis, or edema.  She has age-related osteoarthritic changes in joints.  Skin:  No ecchymosis or petechia.  LABORATORY STUDIES:  White cell count is 5.9, hemoglobin 12.7, hematocrit 37.2, platelet count 97,000.  IMPRESSION:  [name] is a very nice 79 year old white female with chronic immune thrombocytopenia.  We will go ahead and give her Nplate today.  I think we can probably try to get her back after Thanksgiving now. This will, I think, make her life a little bit better.  Again, she is responding well to the Nplate.    ______________________________ Josph Macho, M.D. PRE/MEDQ  D:  05/25/2013  T:  05/28/2013  Job:  [id number]

## 2013-06-04 ENCOUNTER — Telehealth: Payer: Self-pay | Admitting: Hematology & Oncology

## 2013-06-04 NOTE — Telephone Encounter (Signed)
Pt moved 12-5 to 12-11 due to transportation only on tues. and thurs.

## 2013-06-15 ENCOUNTER — Encounter (HOSPITAL_COMMUNITY): Payer: Self-pay

## 2013-06-15 NOTE — Progress Notes (Signed)
Quick Note:  Result released into my chart. ______ 

## 2013-06-16 ENCOUNTER — Encounter: Payer: Self-pay | Admitting: Emergency Medicine

## 2013-06-19 ENCOUNTER — Encounter: Payer: Self-pay | Admitting: Cardiovascular Disease

## 2013-06-19 DIAGNOSIS — I350 Nonrheumatic aortic (valve) stenosis: Secondary | ICD-10-CM | POA: Insufficient documentation

## 2013-06-19 NOTE — Progress Notes (Signed)
Patient ID: Lisa James, female   DOB: 29-Mar-1928, 79 y.o.   MRN: [id number]     HPI: Lisa James is an 79 year old female who presents to the office for followup cardiology evaluation. Remotely, the patient had been seen by Dr. Valera James. She established cardiology care with me one month ago.  Lisa James has a history of hypertension, hyperlipidemia, and CAD. In March 2009 she underwent CABG surgery x5 emergently by Dr. Tyrone James following myocardial infarction. An echo Doppler study in 2010 showed an ejection fraction in the 40-45% range and there was evidence for mild aortic stenosis. Additional problems also include a subdural hematoma, a history of immune thrombocytopenia and most recently has been on Nplate therapy  followed by Dr. Rosemarie James. There is also mention in the chart of an occlusion and stenosis of carotid artery without mention of cerebral infarction. She last saw Dr. Daleen James several years ago. Lisa James remains active. [facility]. According to her family member who is here with her today, she remains active. She does dance. She keeps herself busy. She denies recent chest pain. At times and some mild shortness of breath. She is unaware of palpitations. When I saw her recently, since was 5 years since her myocardial infarction and bypass skirt surgery I recommended she undergo a LexiScan Myoview study to evaluate graft patency, scar/ischemia.  I also scheduled her for a 2-D echo Doppler study as well as laboratory. She presents now for followup evaluation.  Her nuclear perfusion study was done on 05/06/2013 and this revealed normal perfusion without scar or ischemia. Post stress ejection fraction was 60%. The echo Doppler study revealed an ejection fraction of 60-65%. She had mild grade 1 diastolic dysfunction with moderate left ventricular hypertrophy. She had a heavily calcified posterior mitral valve annulus. There was mild aortic regurgitation. She  had mild tricuspid regurgitation. Her aortic valve area was 1.1 1.2 cm and she was felt to have mild to moderate aortic stenosis. Her mean gradient was 13 and peak gradient was 23 mm. She denies recent chest pain or shortness of breath or heart failure symptoms.  Past Medical History  Diagnosis Date  . Acute MI, lateral wall   . Chronic systolic heart failure   . Ischemic heart disease   . Mitral regurgitation   . HTN (hypertension)   . DM (diabetes mellitus)   . Non Hodgkin's lymphoma     of the throat  . Immune thrombocytopenic purpura 01/21/2013    Past Surgical History  Procedure Laterality Date  . Coronary artery bypass graft  11/20/2007    x5  . Bone marrow biopsy  11/22/2002    left posterior iliac crest bone marrow biopsy and aspirate  . Submental lymph node excisional biopsy  10/22/2002    Allergies  Allergen Reactions  . Ace Inhibitors Cough    Current Outpatient Prescriptions  Medication Sig Dispense Refill  . atorvastatin (LIPITOR) 20 MG tablet Take 1 tablet (20 mg total) by mouth daily.  30 tablet  11  . calcium carbonate (TUMS - DOSED IN MG ELEMENTAL CALCIUM) 500 MG chewable tablet Chew 1 tablet by mouth daily.      . Calcium Carbonate-Vitamin D (CALCIUM-VITAMIN D) 500-200 MG-UNIT per tablet Take 1 tablet by mouth every morning. CALCIUM 500 MG + VITAMIN D 3 1000 UNITS.      Marland Kitchen carvedilol (COREG) 6.25 MG tablet Take 1 tablet (6.25 mg total) by mouth 2 (two) times daily with a meal.  60 tablet  11  . Cholecalciferol (VITAMIN D) 1000 UNITS capsule Take 1,000 Units by mouth daily.      . furosemide (LASIX) 20 MG tablet Take 1 tablet (20 mg total) by mouth daily.  30 tablet  11  . losartan (COZAAR) 50 MG tablet Take 1 tablet i the AM and 1/2 tablet in the PM  45 tablet  11  . mupirocin ointment (BACTROBAN) 2 % Apply topically 3 (three) times daily.  22 g  0  . potassium chloride SA (K-DUR,KLOR-CON) 20 MEQ tablet Take 1 tablet (20 mEq total) by mouth daily.  30 tablet  11  .  romiPLOStim (NPLATE) 250 MCG injection Inject 1 mcg/kg into the skin as needed.       . sertraline (ZOLOFT) 25 MG tablet Take 1 tablet (25 mg total) by mouth daily.  30 tablet  11  . spironolactone (ALDACTONE) 25 MG tablet Take 1 tablet (25 mg total) by mouth daily.  30 tablet  0   No current facility-administered medications for this visit.   Facility-Administered Medications Ordered in Other Visits  Medication Dose Route Frequency Provider Last Rate Last Dose  . diphenhydrAMINE (BENADRYL) tablet 25 mg  25 mg Oral Q6H PRN Lisa James   25 mg at 01/28/13 8295    Socially she is a widow for 10 years. She has 2 children 4 grandchildren and 3 great-grandchildren. 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 Remotely she used to use snuff but quit this 20 years ago. There is no alcohol use. He does walk  Family History  Problem Relation Age of Onset  . Heart attack Father   . Diabetes Father    Of note, her mother lived until age 92. Father died around 79 years of age.   ROS is negative for fever chills or night sweats. She wears glasses. She denies recent change in vision. She denies wheezing. She does have some mild short of breath her she denies chest pressure. She is unaware of palpitations. She denies presyncope. There is a history of a subdural hematoma. She denies abdominal complaints presently. She denies active bleeding presently. She denies claudication. She denies significant edema. She denies tremors. She denies rash Other system review is negative.    PE BP 110/70  Pulse 60 General: Alert, oriented, no distress.  Skin: normal turgor, no rashes  HEENT: Normocephalic, atraumatic. Pupils round and reactive; sclera anicteric; Fundi mild arteriolar narrowing. Nose without nasal septal hypertrophy Mouth/Parynx benign; Mallinpatti scale Neck: No JVD, no carotid briuts Mildly kyphotic Lungs: clear to ausculatation and percussion; no wheezing or rales Heart:  RRR, s1 s2 normal 2/6 systolic murmur in the aortic region. Mild transmission to carotids Abdomen: soft, nontender; no hepatosplenomehaly, BS+; abdominal aorta nontender and not dilated by palpation. Pulses 2+ Extremities: no clubbinbg cyanosis or edema, Homan's sign negative  Neurologic: grossly nonfocal Psychologic: normal affect and mood   LABS:  BMET    Component Value Date/Time   NA 136 05/06/2013 0852   NA 142 01/21/2013 1157   K 4.4 05/06/2013 0852   K 5.3* 01/21/2013 1157   CL 100 05/06/2013 0852   CL 103 01/21/2013 1157   CO2 29 05/06/2013 0852   CO2 27 01/21/2013 1157   GLUCOSE 129* 05/06/2013 0852   GLUCOSE 123* 01/21/2013 1157   BUN 19 05/06/2013 0852   BUN 32* 01/21/2013 1157   CREATININE 1.31* 05/06/2013 0852   CREATININE 1.54* 11/22/2012 1022   CALCIUM 9.3  05/06/2013 0852   CALCIUM 9.8 01/21/2013 1157   GFRNONAA 30* 11/22/2012 1022   GFRAA 35* 11/22/2012 1022     Hepatic Function Panel     Component Value Date/Time   PROT 6.7 05/06/2013 0852   PROT 6.4 01/21/2013 1157   ALBUMIN 4.5 05/06/2013 0852   AST 21 05/06/2013 0852   AST 24 01/21/2013 1157   ALT 12 05/06/2013 0852   ALT 18 01/21/2013 1157   ALKPHOS 84 05/06/2013 0852   ALKPHOS 67 01/21/2013 1157   BILITOT 0.9 05/06/2013 0852   BILITOT 1.10 01/21/2013 1157   BILIDIR 0.2 06/20/2009 0000     CBC    Component Value Date/Time   WBC 5.9 05/27/2013 0909   WBC 7.5 05/13/2013 0900   WBC 6.6 05/06/2013 0852   WBC 5.6 04/03/2013 0904   RBC 4.23 05/13/2013 0900   RBC 4.30 05/06/2013 0852   RBC 3.80* 04/03/2013 0904   RBC 3.39* 05/13/2011 0545   HGB 12.7 05/27/2013 0909   HGB 13.4 05/13/2013 0900   HGB 13.8 05/06/2013 0852   HGB 12.2 04/03/2013 0904   HCT 37.2 05/27/2013 0909   HCT 39.5 05/13/2013 0900   HCT 40.8 05/06/2013 0852   HCT 38.5 04/03/2013 0904   PLT 97* 05/27/2013 0909   PLT 141* 05/13/2013 0900   PLT 155 05/06/2013 0852   MCV 94 05/27/2013 0909   MCV 93.4 05/13/2013 0900   MCV 94.9 05/06/2013 0852   MCV  101.4* 04/03/2013 0904   MCH 32.0 05/27/2013 0909   MCH 31.6 05/13/2013 0900   MCH 32.1 05/06/2013 0852   MCH 32.1* 04/03/2013 0904   MCHC 34.1 05/27/2013 0909   MCHC 33.9 05/13/2013 0900   MCHC 33.8 05/06/2013 0852   MCHC 31.7* 04/03/2013 0904   RDW 12.6 05/27/2013 0909   RDW 13.2 05/13/2013 0900   RDW 13.5 05/06/2013 0852   LYMPHSABS 1.0 05/27/2013 0909   LYMPHSABS 1.1 05/13/2013 0900   LYMPHSABS 1.2 09/29/2012 1655   MONOABS 0.7 05/13/2013 0900   MONOABS 0.5 09/29/2012 1655   EOSABS 0.4 05/27/2013 0909   EOSABS 0.3 05/13/2013 0900   EOSABS 0.5 09/29/2012 1655   BASOSABS 0.0 05/27/2013 0909   BASOSABS 0.1 05/13/2013 0900   BASOSABS 0.0 09/29/2012 1655     BNP No results found for this basename: probnp    Lipid Panel     Component Value Date/Time   CHOL 127 05/06/2013 0852   TRIG 166* 05/06/2013 0852   HDL 29* 05/06/2013 0852   CHOLHDL 4.4 05/06/2013 0852   VLDL 33 05/06/2013 0852   LDLCALC 65 05/06/2013 0852     RADIOLOGY: No results found.   ASSESSMENT AND PLAN: My impression is that Ms. Hietpas is an 79 year old femalewith a history of hypertension, mild renal insufficiency, who  suffered a myocardial infarction in 2009 and  Underwent CABG surgery x5 by Dr. Tyrone James. Additional problems have included the development of chronic immune thrombocytopenia for which she currently is under therapy with Dr. Rosemarie James. There is a history of a subdural hematoma. I did review her most recent echo Doppler as well as nuclear perfusion study. Her nuclear perfusion scan continues to show normal perfusion without scar or ischemia. Her echo Doppler study shows moderate LVH with at least mild-to-moderate aortic valve stenosis. There was mild aortic insufficiency and significant mitral and aortic calcification with mild MR and mild TR. Her recent laboratory does show some improvement in renal function with a creatinine of 1.31 down from 1.54. Her  blood pressure today is well controlled. She sees Dr.  Viviann Spare.per primary care. As long as she remains stable I will see her in one year for cardiology reevaluation or sooner problems arise Lennette Bihari, James, [facility] 06/19/2013 12:16 PM

## 2013-06-25 ENCOUNTER — Ambulatory Visit: Payer: Medicare Other | Admitting: Hematology & Oncology

## 2013-06-25 ENCOUNTER — Ambulatory Visit: Payer: Medicare Other

## 2013-06-25 ENCOUNTER — Other Ambulatory Visit: Payer: Medicare Other | Admitting: Lab

## 2013-06-28 NOTE — Progress Notes (Signed)
CC:   [name]. [name], M.D. Hewitt Shorts, M.D.  DIAGNOSIS:  Chronic immune thrombocytopenia.  CURRENT THERAPY:  Nplate to keep platelet count above 100,000.  INTERIM HISTORY:  Ms. Stanfill comes in for followup.  She has actually done quite well.  She has had no problems with bleeding or bruising. There have been no problems with cough or shortness of breath.  Her last Nplate injection was back on 09/11.  Her platelet count on 09/11 was 104.  Again, I am surprised that she got a dose at that time.  She has had no bleeding, as I stated previously.  Her appetite has been good.  She has had no cough or shortness of breath.  She has had no headache.  PHYSICAL EXAMINATION:  General:  This is a well-developed, well- nourished, elderly female in no obvious distress.  Vital Signs: Temperature of 98.2, pulse 57, respiratory rate 18, blood pressure 177/61, weight is 119 pounds.  Head and Neck:  Normocephalic, atraumatic skull.  There are no ocular or oral lesions.  There are no palpable cervical or supraclavicular lymph nodes.  Lungs:  Clear bilaterally. Cardiac:  Regular rate and rhythm with a normal S1 and S2.  There are no murmurs, rubs, or bruits.  Abdomen:  Soft.  She has good bowel sounds. There is no fluid wave.  There is no palpable abdominal mass.  There is no palpable hepatosplenomegaly.  Extremities:  Osteoarthritic changes. She has good muscle tone.  Range of motion of her joints is okay.  Skin: No rashes.  Neurological:  No focal neurological deficits.  LABORATORY STUDIES:  White cell count 6.6, hemoglobin 12.7, hematocrit 37.4, platelet count 71,000.  IMPRESSION:  Ms. Hoey is an 79 year old white female with chronic immune thrombocytopenia.  Her platelet count is back down today.  We will go ahead and give her a dose of Nplate.  The dose will be 1.5 mcg/kg.  We will plan to get her back in 2 weeks for another followup.  I will plan to see her back in 6  weeks.   ______________________________ Josph Macho, M.D. PRE/MEDQ  D:  04/15/2013  T:  06/27/2013  Job:  [id number]

## 2013-07-01 ENCOUNTER — Ambulatory Visit: Payer: Medicare Other

## 2013-07-01 ENCOUNTER — Telehealth: Payer: Self-pay | Admitting: Hematology & Oncology

## 2013-07-01 ENCOUNTER — Other Ambulatory Visit: Payer: Medicare Other | Admitting: Lab

## 2013-07-01 ENCOUNTER — Ambulatory Visit: Payer: Medicare Other | Admitting: Hematology & Oncology

## 2013-07-01 NOTE — Telephone Encounter (Signed)
Niece aware of 12-16 appointment

## 2013-07-06 ENCOUNTER — Ambulatory Visit (HOSPITAL_BASED_OUTPATIENT_CLINIC_OR_DEPARTMENT_OTHER): Payer: Medicare Other | Admitting: Hematology & Oncology

## 2013-07-06 ENCOUNTER — Other Ambulatory Visit (HOSPITAL_BASED_OUTPATIENT_CLINIC_OR_DEPARTMENT_OTHER): Payer: Medicare Other | Admitting: Lab

## 2013-07-06 ENCOUNTER — Ambulatory Visit (HOSPITAL_BASED_OUTPATIENT_CLINIC_OR_DEPARTMENT_OTHER): Payer: Medicare Other

## 2013-07-06 VITALS — BP 148/50 | HR 55 | Temp 98.0°F | Resp 14 | Ht 60.0 in | Wt 124.0 lb

## 2013-07-06 DIAGNOSIS — D693 Immune thrombocytopenic purpura: Secondary | ICD-10-CM

## 2013-07-06 LAB — CBC WITH DIFFERENTIAL (CANCER CENTER ONLY)
BASO%: 0.4 % (ref 0.0–2.0)
EOS%: 6.4 % (ref 0.0–7.0)
HCT: 36 % (ref 34.8–46.6)
LYMPH#: 1 10*3/uL (ref 0.9–3.3)
MCHC: 33.6 g/dL (ref 32.0–36.0)
MONO#: 0.5 10*3/uL (ref 0.1–0.9)
NEUT#: 3.7 10*3/uL (ref 1.5–6.5)
NEUT%: 66.3 % (ref 39.6–80.0)
Platelets: 71 10*3/uL — ABNORMAL LOW (ref 145–400)
RDW: 13.5 % (ref 11.1–15.7)
WBC: 5.5 10*3/uL (ref 3.9–10.0)

## 2013-07-06 MED ORDER — ROMIPLOSTIM 250 MCG ~~LOC~~ SOLR
80.0000 ug | Freq: Once | SUBCUTANEOUS | Status: AC
  Administered 2013-07-06: 80 ug via SUBCUTANEOUS
  Filled 2013-07-06: qty 0.16

## 2013-07-06 MED ORDER — SODIUM CHLORIDE 0.9 % IJ SOLN
3.0000 mL | Freq: Once | INTRAMUSCULAR | Status: DC | PRN
  Filled 2013-07-06: qty 10

## 2013-07-06 MED ORDER — ROMIPLOSTIM 250 MCG ~~LOC~~ SOLR
55.0000 ug | Freq: Once | SUBCUTANEOUS | Status: DC
  Filled 2013-07-06: qty 0.11

## 2013-07-06 MED ORDER — ALTEPLASE 2 MG IJ SOLR
2.0000 mg | Freq: Once | INTRAMUSCULAR | Status: DC | PRN
  Filled 2013-07-06: qty 2

## 2013-07-06 MED ORDER — SODIUM CHLORIDE 0.9 % IJ SOLN
10.0000 mL | INTRAMUSCULAR | Status: DC | PRN
  Filled 2013-07-06: qty 10

## 2013-07-06 NOTE — Progress Notes (Signed)
This office note has been dictated.

## 2013-07-06 NOTE — Patient Instructions (Signed)
Romiplostim injection What is this medicine? ROMIPLOSTIM (roe mi PLOE stim) helps your body make more platelets. This medicine is used to treat low platelets caused by chronic idiopathic thrombocytopenic purpura (ITP). This medicine may be used for other purposes; ask your health care provider or pharmacist if you have questions. COMMON BRAND NAME(S): Nplate What should I tell my health care provider before I take this medicine? They need to know if you have any of these conditions: -cancer or myelodysplastic syndrome -low blood counts, like low white cell, platelet, or red cell counts -take medicines that treat or prevent blood clots -an unusual or allergic reaction to romiplostim, mannitol, other medicines, foods, dyes, or preservatives -pregnant or trying to get pregnant -breast-feeding How should I use this medicine? This medicine is for injection under the skin. It is given by a health care professional in a hospital or clinic setting. A special MedGuide will be given to you before your injection. Read this information carefully each time. Talk to your pediatrician regarding the use of this medicine in children. Special care may be needed. Overdosage: If you think you have taken too much of this medicine contact a poison control center or emergency room at once. NOTE: This medicine is only for you. Do not share this medicine with others. What if I miss a dose? It is important not to miss your dose. Call your doctor or health care professional if you are unable to keep an appointment. What may interact with this medicine? Interactions are not expected. This list may not describe all possible interactions. Give your health care provider a list of all the medicines, herbs, non-prescription drugs, or dietary supplements you use. Also tell them if you smoke, drink alcohol, or use illegal drugs. Some items may interact with your medicine. What should I watch for while using this  medicine? Your condition will be monitored carefully while you are receiving this medicine. Visit your prescriber or health care professional for regular checks on your progress and for the needed blood tests. It is important to keep all appointments. What side effects may I notice from receiving this medicine? Side effects that you should report to your doctor or health care professional as soon as possible: -allergic reactions like skin rash, itching or hives, swelling of the face, lips, or tongue -shortness of breath, chest pain, swelling in a leg -unusual bleeding or bruising Side effects that usually do not require medical attention (report to your doctor or health care professional if they continue or are bothersome): -dizziness -headache -muscle aches -pain in arms and legs -stomach pain -trouble sleeping This list may not describe all possible side effects. Call your doctor for medical advice about side effects. You may report side effects to FDA at 1-800-FDA-1088. Where should I keep my medicine? This drug is given in a hospital or clinic and will not be stored at home. NOTE: This sheet is a summary. It may not cover all possible information. If you have questions about this medicine, talk to your doctor, pharmacist, or health care provider.  2014, Elsevier/Gold Standard. (2008-03-07 15:13:04)  

## 2013-07-07 ENCOUNTER — Telehealth: Payer: Self-pay | Admitting: Hematology & Oncology

## 2013-07-07 NOTE — Telephone Encounter (Signed)
Pt aware of 1-8 and 29th appointments at [facility] and 2-19 appointments here.

## 2013-07-07 NOTE — Progress Notes (Signed)
DIAGNOSIS:  Chronic immune thrombocytopenia.  CURRENT THERAPY:  Nplate-dosed to maintain platelet count above the 90,000.  INTERIM HISTORY:  Lisa James comes in for followup.  She is doing okay. We last saw her back in early November.  She got the Nplates then. Since then, she has not had any Nplate or lab work.  We will try and need to make sure she is seen every 2-3 weeks, so that we can monitor her platelet count little more closely. Because she has had no bleeding. There has been no bruising.  She has had no cough or shortness of breath.  She has had no nausea or vomiting.  There has been no change in bowel or bladder habits.  She is still at the assisted living center and doing well.  She is on quite a few medications.  PHYSICAL EXAMINATION:  General:  This is a elderly, petite white female, in no obvious distress.  Vital Signs:  Temperature of 98, pulse 55, respiratory rate 14, blood pressure 148/50, weight is 124 pounds.  Head and Neck:  Normocephalic, atraumatic skull.  She has no ocular or oral lesions.  She has no scleral icterus.  There is no adenopathy in the neck.  Lungs:  Clear bilaterally.  Cardiac:  Regular rate and rhythm with a normal S1 and S2.  There are no murmurs, rubs, or bruits. Abdomen:  Soft.  She has good bowel sounds.  There is no palpable abdominal mass.  There is no palpable hepatosplenomegaly.  Back:  Shows some kyphosis.  Extremities:  Shows osteoarthritic changes in her joints.  She has good muscle strength.  She has decent range of motion of her joints.  Skin:  Shows no ecchymoses or petechia.  Neurological: No focal neurological deficits.  LABORATORY STUDIES:  White cell count is 5.5, hemoglobin 12.1, hematocrit 36, platelet count 71,000.  IMPRESSION:  Lisa James is an 79 year old white female.  Patient with chronic immune thrombocytopenia.  She is doing okay with this.  We will go ahead and increase the dose of the Nplate today a little  bit.  She has had no problems with bleeding.  There has been no obvious issues with respect to her having this subdural, that she had I think couple years ago.  We really need to make sure she gets her blood counts checked a little more often.  I want to make sure we do this every 3 weeks.  I will plan to see her back myself in another 6 weeks or so.  She likes to go to [employer] in between visits.  As this is a lot easier for her.   ______________________________ Josph Macho, M.D. PRE/MEDQ  D:  07/06/2013  T:  07/07/2013  Job:  [id number]

## 2013-07-22 DIAGNOSIS — S065X9A Traumatic subdural hemorrhage with loss of consciousness of unspecified duration, initial encounter: Secondary | ICD-10-CM

## 2013-07-22 DIAGNOSIS — S065XAA Traumatic subdural hemorrhage with loss of consciousness status unknown, initial encounter: Secondary | ICD-10-CM

## 2013-07-22 HISTORY — DX: Traumatic subdural hemorrhage with loss of consciousness status unknown, initial encounter: S06.5XAA

## 2013-07-22 HISTORY — DX: Traumatic subdural hemorrhage with loss of consciousness of unspecified duration, initial encounter: S06.5X9A

## 2013-07-29 ENCOUNTER — Other Ambulatory Visit (HOSPITAL_BASED_OUTPATIENT_CLINIC_OR_DEPARTMENT_OTHER): Payer: Medicare Other

## 2013-07-29 ENCOUNTER — Ambulatory Visit (HOSPITAL_BASED_OUTPATIENT_CLINIC_OR_DEPARTMENT_OTHER): Payer: Medicare Other

## 2013-07-29 VITALS — BP 122/59 | HR 69 | Temp 98.3°F

## 2013-07-29 DIAGNOSIS — D693 Immune thrombocytopenic purpura: Secondary | ICD-10-CM

## 2013-07-29 LAB — CBC WITH DIFFERENTIAL/PLATELET
BASO%: 0.3 % (ref 0.0–2.0)
BASOS ABS: 0 10*3/uL (ref 0.0–0.1)
EOS ABS: 0.4 10*3/uL (ref 0.0–0.5)
EOS%: 5.6 % (ref 0.0–7.0)
HEMATOCRIT: 34.2 % — AB (ref 34.8–46.6)
HEMOGLOBIN: 12.2 g/dL (ref 11.6–15.9)
LYMPH%: 14.1 % (ref 14.0–49.7)
MCH: 32.8 pg (ref 25.1–34.0)
MCHC: 35.7 g/dL (ref 31.5–36.0)
MCV: 91.9 fL (ref 79.5–101.0)
MONO#: 0.6 10*3/uL (ref 0.1–0.9)
MONO%: 8.8 % (ref 0.0–14.0)
NEUT%: 71.2 % (ref 38.4–76.8)
NEUTROS ABS: 4.7 10*3/uL (ref 1.5–6.5)
PLATELETS: 55 10*3/uL — AB (ref 145–400)
RBC: 3.72 10*6/uL (ref 3.70–5.45)
RDW: 14.1 % (ref 11.2–14.5)
WBC: 6.6 10*3/uL (ref 3.9–10.3)
lymph#: 0.9 10*3/uL (ref 0.9–3.3)
nRBC: 0 % (ref 0–0)

## 2013-07-29 MED ORDER — ROMIPLOSTIM 250 MCG ~~LOC~~ SOLR
55.0000 ug | Freq: Once | SUBCUTANEOUS | Status: AC
  Administered 2013-07-29: 55 ug via SUBCUTANEOUS
  Filled 2013-07-29: qty 0.11

## 2013-08-05 ENCOUNTER — Ambulatory Visit: Payer: Medicare Other

## 2013-08-05 ENCOUNTER — Other Ambulatory Visit: Payer: Medicare Other

## 2013-08-19 ENCOUNTER — Other Ambulatory Visit (HOSPITAL_BASED_OUTPATIENT_CLINIC_OR_DEPARTMENT_OTHER): Payer: Medicare Other

## 2013-08-19 ENCOUNTER — Ambulatory Visit (HOSPITAL_BASED_OUTPATIENT_CLINIC_OR_DEPARTMENT_OTHER): Payer: Medicare Other

## 2013-08-19 VITALS — BP 139/66 | HR 72 | Temp 98.5°F

## 2013-08-19 DIAGNOSIS — D693 Immune thrombocytopenic purpura: Secondary | ICD-10-CM

## 2013-08-19 LAB — CBC WITH DIFFERENTIAL/PLATELET
BASO%: 0.5 % (ref 0.0–2.0)
Basophils Absolute: 0 10*3/uL (ref 0.0–0.1)
EOS%: 6.8 % (ref 0.0–7.0)
Eosinophils Absolute: 0.4 10*3/uL (ref 0.0–0.5)
HCT: 36 % (ref 34.8–46.6)
HGB: 12.3 g/dL (ref 11.6–15.9)
LYMPH#: 0.9 10*3/uL (ref 0.9–3.3)
LYMPH%: 15.5 % (ref 14.0–49.7)
MCH: 33.3 pg (ref 25.1–34.0)
MCHC: 34 g/dL (ref 31.5–36.0)
MCV: 97.8 fL (ref 79.5–101.0)
MONO#: 0.5 10*3/uL (ref 0.1–0.9)
MONO%: 8.3 % (ref 0.0–14.0)
NEUT#: 3.9 10*3/uL (ref 1.5–6.5)
NEUT%: 68.9 % (ref 38.4–76.8)
Platelets: 68 10*3/uL — ABNORMAL LOW (ref 145–400)
RBC: 3.68 10*6/uL — ABNORMAL LOW (ref 3.70–5.45)
RDW: 14.4 % (ref 11.2–14.5)
WBC: 5.6 10*3/uL (ref 3.9–10.3)

## 2013-08-19 MED ORDER — ROMIPLOSTIM 250 MCG ~~LOC~~ SOLR
55.0000 ug | Freq: Once | SUBCUTANEOUS | Status: AC
  Administered 2013-08-19: 55 ug via SUBCUTANEOUS
  Filled 2013-08-19: qty 0.11

## 2013-08-19 NOTE — Patient Instructions (Signed)

## 2013-09-09 ENCOUNTER — Ambulatory Visit (HOSPITAL_BASED_OUTPATIENT_CLINIC_OR_DEPARTMENT_OTHER): Payer: Medicare Other

## 2013-09-09 ENCOUNTER — Other Ambulatory Visit (HOSPITAL_BASED_OUTPATIENT_CLINIC_OR_DEPARTMENT_OTHER): Payer: Medicare Other | Admitting: Lab

## 2013-09-09 ENCOUNTER — Ambulatory Visit (HOSPITAL_BASED_OUTPATIENT_CLINIC_OR_DEPARTMENT_OTHER): Payer: Medicare Other | Admitting: Hematology & Oncology

## 2013-09-09 ENCOUNTER — Encounter: Payer: Self-pay | Admitting: Hematology & Oncology

## 2013-09-09 VITALS — BP 160/52 | HR 62 | Temp 97.7°F | Resp 14 | Ht 62.0 in | Wt 126.0 lb

## 2013-09-09 DIAGNOSIS — D693 Immune thrombocytopenic purpura: Secondary | ICD-10-CM

## 2013-09-09 DIAGNOSIS — D696 Thrombocytopenia, unspecified: Secondary | ICD-10-CM

## 2013-09-09 LAB — CBC WITH DIFFERENTIAL (CANCER CENTER ONLY)
BASO#: 0 10*3/uL (ref 0.0–0.2)
BASO%: 0.4 % (ref 0.0–2.0)
EOS%: 7.8 % — AB (ref 0.0–7.0)
Eosinophils Absolute: 0.5 10*3/uL (ref 0.0–0.5)
HCT: 36.2 % (ref 34.8–46.6)
HGB: 12.5 g/dL (ref 11.6–15.9)
LYMPH#: 1.2 10*3/uL (ref 0.9–3.3)
LYMPH%: 17.4 % (ref 14.0–48.0)
MCH: 33.2 pg (ref 26.0–34.0)
MCHC: 34.5 g/dL (ref 32.0–36.0)
MCV: 96 fL (ref 81–101)
MONO#: 0.5 10*3/uL (ref 0.1–0.9)
MONO%: 7.2 % (ref 0.0–13.0)
NEUT%: 67.2 % (ref 39.6–80.0)
NEUTROS ABS: 4.7 10*3/uL (ref 1.5–6.5)
Platelets: 75 10*3/uL — ABNORMAL LOW (ref 145–400)
RBC: 3.77 10*6/uL (ref 3.70–5.32)
RDW: 12.8 % (ref 11.1–15.7)
WBC: 6.9 10*3/uL (ref 3.9–10.0)

## 2013-09-09 MED ORDER — ROMIPLOSTIM 250 MCG ~~LOC~~ SOLR
55.0000 ug | Freq: Once | SUBCUTANEOUS | Status: AC
  Administered 2013-09-09: 55 ug via SUBCUTANEOUS
  Filled 2013-09-09: qty 0.11

## 2013-09-09 NOTE — Progress Notes (Signed)
Hematology and Oncology Follow Up Visit  BRAILEY BUESCHER [id number] January 24, 1928 80 y.o. 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  Munce is in for followup. She goes to the [facility] every 3 weeks for blood work and Nplate. She's had no bleeding. There's been no headache. She's had problems in the past with subdural bleed. Thankfully, this has not been a problem.  She's at a nursing home. She is doing well there. She HEENT. She's had no fever. She's had no rashes. There's been no bruises.  Medications: Current outpatient prescriptions:atorvastatin (LIPITOR) 20 MG tablet, Take 1 tablet (20 mg total) by mouth daily., Disp: 30 tablet, Rfl: 11;  calcium carbonate (TUMS - DOSED IN MG ELEMENTAL CALCIUM) 500 MG chewable tablet, Chew 1 tablet by mouth daily., Disp: , Rfl: ;  Calcium Carbonate-Vitamin D (CALCIUM-VITAMIN D) 500-200 MG-UNIT per tablet, Take 1 tablet by mouth every morning. CALCIUM 500 MG + VITAMIN D 3 1000 UNITS., Disp: , Rfl:  carvedilol (COREG) 6.25 MG tablet, Take 1 tablet (6.25 mg total) by mouth 2 (two) times daily with a meal., Disp: 60 tablet, Rfl: 11;  Cholecalciferol (VITAMIN D) 1000 UNITS capsule, Take 1,000 Units by mouth daily., Disp: , Rfl: ;  furosemide (LASIX) 20 MG tablet, Take 1 tablet (20 mg total) by mouth daily., Disp: 30 tablet, Rfl: 11;  losartan (COZAAR) 50 MG tablet, Take 1 tablet i the AM and 1/2 tablet in the PM, Disp: 45 tablet, Rfl: 11 mupirocin ointment (BACTROBAN) 2 %, Apply topically 3 (three) times daily., Disp: 22 g, Rfl: 0;  potassium chloride SA (K-DUR,KLOR-CON) 20 MEQ tablet, Take 1 tablet (20 mEq total) by mouth daily., Disp: 30 tablet, Rfl: 11;  romiPLOStim (NPLATE) 250 MCG injection, Inject 1 mcg/kg into the skin as needed. , Disp: , Rfl: ;  sertraline (ZOLOFT) 25 MG tablet, Take 1 tablet (25 mg total) by mouth  daily., Disp: 30 tablet, Rfl: 11 spironolactone (ALDACTONE) 25 MG tablet, Take 1 tablet (25 mg total) by mouth daily., Disp: 30 tablet, Rfl: 0;  UNKNOWN TO PATIENT, PATIENT WILL TAKE MEDICATION SHEET HOME TO REVIEW WITH FAMILY RESPONSIBLE FOR MEDS.  06-06-13, Disp: , Rfl:  No current facility-administered medications for this visit. Facility-Administered Medications Ordered in Other Visits: diphenhydrAMINE (BENADRYL) tablet 25 mg, 25 mg, Oral, Q6H PRN, Volanda Napoleon, MD, 25 mg at 01/28/13 0913  Allergies:  Allergies  Allergen Reactions  . Ace Inhibitors Cough    Past Medical History, Surgical history, Social history, and Family History were reviewed and updated.  Review of Systems: As above  Physical Exam:  height is _0  (1.575 m) and weight is 126 lb (57.153 kg). Her oral temperature is 97.7 F (36.5 C). Her blood pressure is 160/52 and her pulse is 62. Her respiration is 14.   Petit, elderly white female in no obvious distress. Head and neck exam shows no ocular or oral lesions. She does not have any adenopathy in the neck.there is no supraclavicular lymph nodes. Lungs are clear. Cardiac exam regular rate and rhythm. She may have an occasional extra beat. Abdomen is soft. There is no fluid wave. There is no palpable hepato- splenomegaly megaly. Extremities shows no clubbing cyanosis or edema. She has osteoarthritic changes that are age related. She has good strength. Skin exam no ecchymoses or petechia. Neurological exam no focal neurological deficits.  Lab Results  Component Value Date   WBC 6.9 09/09/2013   HGB 12.5 09/09/2013   HCT 36.2 09/09/2013   MCV 96 09/09/2013   PLT 75* 09/09/2013     Chemistry      Component Value Date/Time   NA 136 05/06/2013 0852   NA 142 01/21/2013 1157   K 4.4 05/06/2013 0852   K 5.3* 01/21/2013 1157   CL 100 05/06/2013 0852   CL 103 01/21/2013 1157   CO2 29 05/06/2013 0852   CO2 27 01/21/2013 1157   BUN 19 05/06/2013 0852   BUN 32* 01/21/2013 1157    CREATININE 1.31* 05/06/2013 0852   CREATININE 1.54* 11/22/2012 1022      Component Value Date/Time   CALCIUM 9.3 05/06/2013 0852   CALCIUM 9.8 01/21/2013 1157   ALKPHOS 84 05/06/2013 0852   ALKPHOS 67 01/21/2013 1157   AST 21 05/06/2013 0852   AST 24 01/21/2013 1157   ALT 12 05/06/2013 0852   ALT 18 01/21/2013 1157   BILITOT 0.9 05/06/2013 0852   BILITOT 1.10 01/21/2013 1157         Impression and Plan: Ms. Bulow is 80 year old white female. She is thrombocytopenia. We have done a bone marrow biopsy on her. There is no obvious leukemia or obvious myelodysplasia. Her cytogenetics were negative.  We are trying to maintain her platelet count above 90,000.we'll give her a dose today.  She will continue every 3 weeks visits to the [facility].  I'll see her back in about 2 months.   Volanda Napoleon, MD 2/19/201510:28 AM

## 2013-09-09 NOTE — Patient Instructions (Signed)

## 2013-09-09 NOTE — Progress Notes (Signed)
Patient presents today to have platelet count checked.  Platelets 75 . NPLATE given. Seen by Dr. Marin Olp.

## 2013-09-21 ENCOUNTER — Encounter: Payer: Self-pay | Admitting: *Deleted

## 2013-09-21 MED ORDER — POTASSIUM CHLORIDE CRYS ER 20 MEQ PO TBCR: 20.0000 meq | EXTENDED_RELEASE_TABLET | ORAL | Status: DC

## 2013-09-22 ENCOUNTER — Telehealth: Payer: Self-pay

## 2013-09-22 NOTE — Telephone Encounter (Signed)
Patient's niece states that patient's Losartin is no longer covered by Medicare. Can she have another medication to treat the same condition or a generic form of Losartin?  [phone] Anne Ng niece)

## 2013-09-23 ENCOUNTER — Telehealth: Payer: Self-pay | Admitting: *Deleted

## 2013-09-23 NOTE — Telephone Encounter (Signed)
Faxed PA request to [facility] for losartan 50 mg. Faxed to [phone].

## 2013-09-24 ENCOUNTER — Telehealth: Payer: Self-pay | Admitting: *Deleted

## 2013-09-24 MED ORDER — IRBESARTAN 300 MG PO TABS: 300.0000 mg | ORAL_TABLET | Freq: Every day | ORAL | Status: DC

## 2013-09-24 NOTE — Telephone Encounter (Signed)
Spoke with Lisa James - pt taking lostartn 100mg  qam and 50mg  qpm.  Suspect that is a quantity/dose issue more than medication issue.  Will switch pt to irbesartan 300mg  once daily.  Explained changes to [place].  Will call rx to [facility], pt to start Saturday.  Has appt with Dr. Everlene Farrier in 7-10 days, they will follow up with him to see if it's working.

## 2013-09-24 NOTE — Telephone Encounter (Signed)
Please advise 

## 2013-09-24 NOTE — Telephone Encounter (Signed)
Anne Ng called in regards to her aunt's Losartan. She went to fill it at the pharmacy and insurance will not cover the medication. Needs to know if there is something else she can take or what should she do.  TK

## 2013-09-24 NOTE — Telephone Encounter (Signed)
Spoke to niece. It looks like in [facility] Dr. Evette Georges office is working on a PA for the Losartan. Had niece check with Dr. Evette Georges office to see if they are working on the Losartan or if we do need to change it.

## 2013-09-25 NOTE — Telephone Encounter (Signed)
Thanks for your help.

## 2013-09-30 ENCOUNTER — Other Ambulatory Visit (HOSPITAL_BASED_OUTPATIENT_CLINIC_OR_DEPARTMENT_OTHER): Payer: Medicare Other

## 2013-09-30 ENCOUNTER — Ambulatory Visit (HOSPITAL_BASED_OUTPATIENT_CLINIC_OR_DEPARTMENT_OTHER): Payer: Medicare Other

## 2013-09-30 VITALS — BP 175/72 | HR 68 | Temp 98.1°F

## 2013-09-30 DIAGNOSIS — D693 Immune thrombocytopenic purpura: Secondary | ICD-10-CM

## 2013-09-30 LAB — CBC WITH DIFFERENTIAL/PLATELET
BASO%: 0.6 % (ref 0.0–2.0)
Basophils Absolute: 0 10*3/uL (ref 0.0–0.1)
EOS%: 7.5 % — AB (ref 0.0–7.0)
Eosinophils Absolute: 0.5 10*3/uL (ref 0.0–0.5)
HCT: 36.7 % (ref 34.8–46.6)
HGB: 12.4 g/dL (ref 11.6–15.9)
LYMPH%: 17.7 % (ref 14.0–49.7)
MCH: 33.5 pg (ref 25.1–34.0)
MCHC: 33.8 g/dL (ref 31.5–36.0)
MCV: 99.2 fL (ref 79.5–101.0)
MONO#: 0.5 10*3/uL (ref 0.1–0.9)
MONO%: 8.6 % (ref 0.0–14.0)
NEUT#: 4 10*3/uL (ref 1.5–6.5)
NEUT%: 65.6 % (ref 38.4–76.8)
Platelets: 77 10*3/uL — ABNORMAL LOW (ref 145–400)
RBC: 3.7 10*6/uL (ref 3.70–5.45)
RDW: 13.8 % (ref 11.2–14.5)
WBC: 6.1 10*3/uL (ref 3.9–10.3)
lymph#: 1.1 10*3/uL (ref 0.9–3.3)

## 2013-09-30 MED ORDER — ROMIPLOSTIM 250 MCG ~~LOC~~ SOLR
55.0000 ug | Freq: Once | SUBCUTANEOUS | Status: AC
  Administered 2013-09-30: 55 ug via SUBCUTANEOUS
  Filled 2013-09-30: qty 0.11

## 2013-10-02 ENCOUNTER — Encounter: Payer: Medicare Other | Admitting: Physician Assistant

## 2013-10-02 NOTE — Progress Notes (Signed)
Erroneous encounter - please disregard.

## 2013-10-04 ENCOUNTER — Telehealth: Payer: Self-pay

## 2013-10-04 NOTE — Telephone Encounter (Signed)
Niece dropped of form for Dr Everlene Farrier to fill out for pt's housebound status/need for regular attendance. I called niece to see if oncologist should possibly fill out bc he has seen pt more recently. She stated that she can ask him if Dr Everlene Farrier is not able to do it. She brought pt in Sat and had over 2 hour wait and still had 2 pts before them to get back to room and couldn't wait any longer. She will make appt for pt's check up but stated Dr Everlene Farrier filled out similar form recently but was the wrong form. Form is in Dr Perfecto Kingdom box for review.

## 2013-10-05 NOTE — Telephone Encounter (Signed)
This is okay. Please check and be sure  the form is in my box.

## 2013-10-06 NOTE — Telephone Encounter (Signed)
I checked your box today- did not find these documents. Do you have them?

## 2013-10-07 NOTE — Telephone Encounter (Signed)
I do not have any papers to fill out on [place].

## 2013-10-08 NOTE — Telephone Encounter (Signed)
I called Lisa James Wednesday 3/18 and left message to return call. I had forms at TL desk. Rushie will need appt for Dr. Everlene Farrier to complete forms since she was last seen 6 months ago. We can work her in Friday 3/27 at 104. Spoke with Anne Ng today 3/20 and patient only has transportation from facility she is in on Tuesday and Thursday. Anne Ng was given Dr. Everlene Farrier schedule for Thursday 3/26 and 4/2 and she will get transportation set up with facility for [facility] to come in and see Dr. Everlene Farrier. I will leave forms in Dr. Caren Griffins box.

## 2013-10-08 NOTE — Telephone Encounter (Signed)
Spoke to [place] yesterday- she is faxing another copy directly to me for Dr. Everlene Farrier to complete.

## 2013-10-21 ENCOUNTER — Other Ambulatory Visit (HOSPITAL_BASED_OUTPATIENT_CLINIC_OR_DEPARTMENT_OTHER): Payer: Medicare Other

## 2013-10-21 ENCOUNTER — Ambulatory Visit (HOSPITAL_BASED_OUTPATIENT_CLINIC_OR_DEPARTMENT_OTHER): Payer: Medicare Other

## 2013-10-21 VITALS — BP 143/66 | HR 76 | Temp 98.7°F

## 2013-10-21 DIAGNOSIS — D693 Immune thrombocytopenic purpura: Secondary | ICD-10-CM

## 2013-10-21 LAB — CBC WITH DIFFERENTIAL/PLATELET
BASO%: 0.4 % (ref 0.0–2.0)
Basophils Absolute: 0 10*3/uL (ref 0.0–0.1)
EOS%: 6.3 % (ref 0.0–7.0)
Eosinophils Absolute: 0.5 10*3/uL (ref 0.0–0.5)
HEMATOCRIT: 36.4 % (ref 34.8–46.6)
HGB: 12.4 g/dL (ref 11.6–15.9)
LYMPH#: 1 10*3/uL (ref 0.9–3.3)
LYMPH%: 13.1 % — ABNORMAL LOW (ref 14.0–49.7)
MCH: 33.3 pg (ref 25.1–34.0)
MCHC: 34.2 g/dL (ref 31.5–36.0)
MCV: 97.6 fL (ref 79.5–101.0)
MONO#: 0.5 10*3/uL (ref 0.1–0.9)
MONO%: 6.4 % (ref 0.0–14.0)
NEUT#: 5.5 10*3/uL (ref 1.5–6.5)
NEUT%: 73.8 % (ref 38.4–76.8)
Platelets: 102 10*3/uL — ABNORMAL LOW (ref 145–400)
RBC: 3.73 10*6/uL (ref 3.70–5.45)
RDW: 13.2 % (ref 11.2–14.5)
WBC: 7.5 10*3/uL (ref 3.9–10.3)

## 2013-10-21 MED ORDER — ROMIPLOSTIM 250 MCG ~~LOC~~ SOLR
55.0000 ug | Freq: Once | SUBCUTANEOUS | Status: AC
  Administered 2013-10-21: 55 ug via SUBCUTANEOUS
  Filled 2013-10-21: qty 0.11

## 2013-10-28 ENCOUNTER — Encounter: Payer: Self-pay | Admitting: Emergency Medicine

## 2013-10-28 ENCOUNTER — Other Ambulatory Visit: Payer: Self-pay | Admitting: Emergency Medicine

## 2013-10-28 ENCOUNTER — Ambulatory Visit (INDEPENDENT_AMBULATORY_CARE_PROVIDER_SITE_OTHER): Payer: Medicare Other | Admitting: Emergency Medicine

## 2013-10-28 VITALS — BP 110/70 | HR 60 | Temp 98.1°F | Resp 16 | Ht 58.5 in | Wt 122.0 lb

## 2013-10-28 DIAGNOSIS — I6200 Nontraumatic subdural hemorrhage, unspecified: Secondary | ICD-10-CM

## 2013-10-28 DIAGNOSIS — D539 Nutritional anemia, unspecified: Secondary | ICD-10-CM

## 2013-10-28 DIAGNOSIS — E119 Type 2 diabetes mellitus without complications: Secondary | ICD-10-CM

## 2013-10-28 LAB — COMPREHENSIVE METABOLIC PANEL
ALT: 12 U/L (ref 0–35)
AST: 17 U/L (ref 0–37)
Albumin: 4.3 g/dL (ref 3.5–5.2)
Alkaline Phosphatase: 93 U/L (ref 39–117)
BILIRUBIN TOTAL: 1.3 mg/dL — AB (ref 0.2–1.2)
BUN: 27 mg/dL — AB (ref 6–23)
CHLORIDE: 102 meq/L (ref 96–112)
CO2: 25 mEq/L (ref 19–32)
CREATININE: 1.32 mg/dL — AB (ref 0.50–1.10)
Calcium: 9.5 mg/dL (ref 8.4–10.5)
GLUCOSE: 135 mg/dL — AB (ref 70–99)
Potassium: 4.7 mEq/L (ref 3.5–5.3)
Sodium: 138 mEq/L (ref 135–145)
Total Protein: 6.3 g/dL (ref 6.0–8.3)

## 2013-10-28 LAB — POCT CBC
GRANULOCYTE PERCENT: 75.7 % (ref 37–80)
HEMATOCRIT: 38.2 % (ref 37.7–47.9)
Hemoglobin: 12.4 g/dL (ref 12.2–16.2)
LYMPH, POC: 1.2 (ref 0.6–3.4)
MCH: 32.9 pg — AB (ref 27–31.2)
MCHC: 32.5 g/dL (ref 31.8–35.4)
MCV: 101.2 fL — AB (ref 80–97)
MID (CBC): 0.6 (ref 0–0.9)
MPV: 7.4 fL (ref 0–99.8)
PLATELET COUNT, POC: 125 10*3/uL — AB (ref 142–424)
POC Granulocyte: 5.5 (ref 2–6.9)
POC LYMPH %: 16.5 % (ref 10–50)
POC MID %: 7.8 %M (ref 0–12)
RBC: 3.77 M/uL — AB (ref 4.04–5.48)
RDW, POC: 13.7 %
WBC: 7.3 10*3/uL (ref 4.6–10.2)

## 2013-10-28 LAB — GLUCOSE, POCT (MANUAL RESULT ENTRY): POC Glucose: 146 mg/dl — AB (ref 70–99)

## 2013-10-28 LAB — POCT GLYCOSYLATED HEMOGLOBIN (HGB A1C): HEMOGLOBIN A1C: 7.4

## 2013-10-28 NOTE — Progress Notes (Signed)
Subjective:     Patient ID: Lisa James, female   DOB: 16-May-1928, 80 y.o.   MRN: [id number]  HPI 80 YO caucasian female presents to [facility] for a check up. She is currently living at a nursing home and is uder the supervision of Dr. Marin Olp for ITP and also following with her oncologist. She denies any current issues and saw the oncologist last on 10/21/13 for her romiplostim injection. Her last blood work was done 10/21/13 and the platelet count was 102. Today she says she is feeling well, hoas had no recent illnesses and is conducting her ADL's well.  Review of Systems  Constitutional: Negative for fever, chills, activity change, appetite change and fatigue.  HENT: Negative for congestion, facial swelling, hearing loss and sinus pressure.   Eyes: Negative for pain and itching.  Respiratory: Negative for cough, chest tightness, shortness of breath and wheezing.   Cardiovascular: Negative for chest pain, palpitations and leg swelling.  Gastrointestinal: Negative for nausea, vomiting, abdominal pain, diarrhea, constipation, blood in stool and abdominal distention.  Endocrine: Negative for polydipsia and polyuria.  Genitourinary: Negative for dysuria, flank pain and difficulty urinating.  Musculoskeletal: Negative for back pain, joint swelling and myalgias.  Skin: Negative for rash.  Neurological: Negative for dizziness, syncope, light-headedness and headaches.       Prior to Admission medications   Medication Sig Start Date End Date Taking? Authorizing Provider  atorvastatin (LIPITOR) 20 MG tablet Take 1 tablet (20 mg total) by mouth daily. 12/26/12   Darlyne Russian, MD  calcium carbonate (TUMS - DOSED IN MG ELEMENTAL CALCIUM) 500 MG chewable tablet Chew 1 tablet by mouth daily.    Historical Provider, MD  Calcium Carbonate-Vitamin D (CALCIUM-VITAMIN D) 500-200 MG-UNIT per tablet Take 1 tablet by mouth every morning. CALCIUM 500 MG + VITAMIN D 3 1000 UNITS.    Historical Provider, MD  carvedilol  (COREG) 6.25 MG tablet Take 1 tablet (6.25 mg total) by mouth 2 (two) times daily with a meal. 12/26/12   Darlyne Russian, MD  Cholecalciferol (VITAMIN D) 1000 UNITS capsule Take 1,000 Units by mouth daily.    Historical Provider, MD  furosemide (LASIX) 20 MG tablet Take 20 mg by mouth every other day. 12/26/12   Darlyne Russian, MD  irbesartan (AVAPRO) 300 MG tablet Take 1 tablet (300 mg total) by mouth daily. 09/24/13   Troy Sine, MD  mupirocin ointment (BACTROBAN) 2 % Apply topically 3 (three) times daily. 04/03/13   Darlyne Russian, MD  potassium chloride SA (K-DUR,KLOR-CON) 20 MEQ tablet Take 20 mEq by mouth every other day. 12/26/12   Darlyne Russian, MD  potassium chloride SA (K-DUR,KLOR-CON) 20 MEQ tablet Take 1 tablet (20 mEq total) by mouth every other day. 09/21/13   Volanda Napoleon, MD  romiPLOStim (NPLATE) 250 MCG injection Inject 1 mcg/kg into the skin as needed.     Historical Provider, MD  sertraline (ZOLOFT) 25 MG tablet Take 1 tablet (25 mg total) by mouth daily. 12/26/12   Darlyne Russian, MD  UNKNOWN TO PATIENT PATIENT WILL TAKE MEDICATION SHEET HOME TO REVIEW WITH FAMILY RESPONSIBLE FOR MEDS.  06-06-13    Historical Provider, MD    Patient Active Problem List   Diagnosis Date Noted  . ITP (idiopathic thrombocytopenic purpura) 07/06/2013  . Aortic valve stenosis 06/19/2013  . Immune thrombocytopenic purpura 01/21/2013  . GERD (gastroesophageal reflux disease) 12/17/2012  . Type II or unspecified type diabetes mellitus with renal manifestations, not  stated as uncontrolled(250.40) 12/10/2012  . Chronic kidney disease (CKD), stage III (moderate) 11/22/2012  . Diabetes mellitus 07/03/2011  . Depression 07/03/2011  . Non-Hodgkin's lymphoma 07/03/2011  . CAD (coronary artery disease) 07/03/2011  . PAD (peripheral artery disease) 07/03/2011  . Vitamin d deficiency 07/03/2011  . Subdural hematoma 07/03/2011  . Confusion 05/12/2011  . Subdural hematoma, acute 05/12/2011  . HYPERLIPIDEMIA TYPE  IIB / III 05/10/2009  . OLD MYOCARDIAL INFARCTION 05/10/2009  . Occlusion and stenosis of carotid artery without mention of cerebral infarction 05/10/2009  . HYPERTENSION, BENIGN 11/03/2008  . MITRAL REGURGITATION 10/29/2008  . ISCHEMIC HEART DISEASE 10/29/2008  . SYSTOLIC HEART FAILURE, CHRONIC 10/29/2008      Objective:   Physical Exam  Constitutional: She appears well-developed and well-nourished. No distress.  HENT:  Head: Normocephalic and atraumatic.  Right Ear: External ear normal.  Left Ear: External ear normal.  Mouth/Throat: No oropharyngeal exudate.  Eyes: Conjunctivae are normal. Pupils are equal, round, and reactive to light. Right eye exhibits no discharge. No scleral icterus.  Neck: Normal range of motion. Neck supple. No thyromegaly present.  Cardiovascular: Normal rate, regular rhythm and intact distal pulses.  Exam reveals no gallop and no friction rub.   Murmur heard. 2-5+ systolic murmur  Pulmonary/Chest: Effort normal and breath sounds normal. No respiratory distress. She has no wheezes. She has no rales. She exhibits no tenderness.  Abdominal: Soft. Bowel sounds are normal. She exhibits no distension. There is no tenderness. There is no rebound and no guarding.  Musculoskeletal: Normal range of motion. She exhibits no edema and no tenderness.  4/5 strength testing bilaterally upper and lower extremity   Neurological: She is alert. No cranial nerve deficit. Coordination normal.  Oriented to place but not time  Skin: Skin is warm and dry. She is not diaphoretic.  Psychiatric: She has a normal mood and affect.   Results for orders placed in visit on 10/21/13  CBC WITH DIFFERENTIAL      Result Value Ref Range   WBC 7.5  3.9 - 10.3 10e3/uL   NEUT# 5.5  1.5 - 6.5 10e3/uL   HGB 12.4  11.6 - 15.9 g/dL   HCT 36.4  34.8 - 46.6 %   Platelets 102 (*) 145 - 400 10e3/uL   MCV 97.6  79.5 - 101.0 fL   MCH 33.3  25.1 - 34.0 pg   MCHC 34.2  31.5 - 36.0 g/dL   RBC 3.73   3.70 - 5.45 10e6/uL   RDW 13.2  11.2 - 14.5 %   lymph# 1.0  0.9 - 3.3 10e3/uL   MONO# 0.5  0.1 - 0.9 10e3/uL   Eosinophils Absolute 0.5  0.0 - 0.5 10e3/uL   Basophils Absolute 0.0  0.0 - 0.1 10e3/uL   NEUT% 73.8  38.4 - 76.8 %   LYMPH% 13.1 (*) 14.0 - 49.7 %   MONO% 6.4  0.0 - 14.0 %   EOS% 6.3  0.0 - 7.0 %   BASO% 0.4  0.0 - 2.0 %        Assessment:    1)ITP 2) DM II 3) CKD stage III 4) hyperlipidemia  5) CAD 6) subdural hematoma    Plan:    1) draw CMET and A1C today   2) Continue current medication regimen, continue to follow with Cardiologist and oncologist for proper medication and injection regimen  3) check CT head

## 2013-11-02 ENCOUNTER — Ambulatory Visit (INDEPENDENT_AMBULATORY_CARE_PROVIDER_SITE_OTHER): Payer: Medicare Other | Admitting: Ophthalmology

## 2013-11-02 DIAGNOSIS — H35039 Hypertensive retinopathy, unspecified eye: Secondary | ICD-10-CM

## 2013-11-02 DIAGNOSIS — I10 Essential (primary) hypertension: Secondary | ICD-10-CM

## 2013-11-02 DIAGNOSIS — E1165 Type 2 diabetes mellitus with hyperglycemia: Secondary | ICD-10-CM

## 2013-11-02 DIAGNOSIS — H3509 Other intraretinal microvascular abnormalities: Secondary | ICD-10-CM

## 2013-11-02 DIAGNOSIS — E11319 Type 2 diabetes mellitus with unspecified diabetic retinopathy without macular edema: Secondary | ICD-10-CM

## 2013-11-02 DIAGNOSIS — H3530 Unspecified macular degeneration: Secondary | ICD-10-CM

## 2013-11-02 DIAGNOSIS — E1139 Type 2 diabetes mellitus with other diabetic ophthalmic complication: Secondary | ICD-10-CM

## 2013-11-02 LAB — VITAMIN B12: Vitamin B-12: 284 pg/mL (ref 211–911)

## 2013-11-11 ENCOUNTER — Ambulatory Visit (HOSPITAL_BASED_OUTPATIENT_CLINIC_OR_DEPARTMENT_OTHER): Payer: Medicare Other | Admitting: Hematology & Oncology

## 2013-11-11 ENCOUNTER — Other Ambulatory Visit (HOSPITAL_BASED_OUTPATIENT_CLINIC_OR_DEPARTMENT_OTHER): Payer: Medicare Other | Admitting: Lab

## 2013-11-11 ENCOUNTER — Encounter: Payer: Self-pay | Admitting: Hematology & Oncology

## 2013-11-11 ENCOUNTER — Ambulatory Visit (HOSPITAL_BASED_OUTPATIENT_CLINIC_OR_DEPARTMENT_OTHER): Payer: Medicare Other

## 2013-11-11 VITALS — BP 163/58 | HR 53 | Temp 97.3°F | Resp 14 | Ht 59.0 in | Wt 123.0 lb

## 2013-11-11 DIAGNOSIS — D693 Immune thrombocytopenic purpura: Secondary | ICD-10-CM

## 2013-11-11 LAB — CBC WITH DIFFERENTIAL (CANCER CENTER ONLY)
BASO#: 0 10*3/uL (ref 0.0–0.2)
BASO%: 0.2 % (ref 0.0–2.0)
EOS%: 5.3 % (ref 0.0–7.0)
Eosinophils Absolute: 0.3 10*3/uL (ref 0.0–0.5)
HEMATOCRIT: 35.5 % (ref 34.8–46.6)
HGB: 12.2 g/dL (ref 11.6–15.9)
LYMPH#: 1.1 10*3/uL (ref 0.9–3.3)
LYMPH%: 20.6 % (ref 14.0–48.0)
MCH: 33.8 pg (ref 26.0–34.0)
MCHC: 34.4 g/dL (ref 32.0–36.0)
MCV: 98 fL (ref 81–101)
MONO#: 0.6 10*3/uL (ref 0.1–0.9)
MONO%: 11.3 % (ref 0.0–13.0)
NEUT#: 3.3 10*3/uL (ref 1.5–6.5)
NEUT%: 62.6 % (ref 39.6–80.0)
Platelets: 68 10*3/uL — ABNORMAL LOW (ref 145–400)
RBC: 3.61 10*6/uL — AB (ref 3.70–5.32)
RDW: 12.8 % (ref 11.1–15.7)
WBC: 5.3 10*3/uL (ref 3.9–10.0)

## 2013-11-11 MED ORDER — ROMIPLOSTIM 250 MCG ~~LOC~~ SOLR
55.0000 ug | Freq: Once | SUBCUTANEOUS | Status: AC
Start: 2013-11-11 — End: 2013-11-11
  Administered 2013-11-11: 55 ug via SUBCUTANEOUS
  Filled 2013-11-11: qty 0.11

## 2013-11-11 NOTE — Patient Instructions (Signed)

## 2013-11-12 NOTE — Progress Notes (Signed)
Hematology and Oncology Follow Up Visit  Lisa James [id number] 05-Nov-1927 80 y.o. 11/12/2013   Principle Diagnosis:   Thrombocytopenia-likely immune thrombocytopenia  Current Therapy:   Nplate as indicated    Interim History:  Ms.  James is back for followup. We last saw her back in February. She has been doing okay. There's been no obvious leaner bruising. She's had no neurological issues. There she's at the nursing home. She's been doing well there.  She's had no fevers with chills. She's had no cough. She's had no bony pain. There's been no problems with her appetite. She's had no change in bowel or bladder habits.  Medications: Current outpatient prescriptions:atorvastatin (LIPITOR) 20 MG tablet, Take 1 tablet (20 mg total) by mouth daily., Disp: 30 tablet, Rfl: 11;  calcium carbonate (TUMS - DOSED IN MG ELEMENTAL CALCIUM) 500 MG chewable tablet, Chew 1 tablet by mouth daily., Disp: , Rfl: ;  Calcium Carbonate-Vitamin D (CALCIUM-VITAMIN D) 500-200 MG-UNIT per tablet, Take 1 tablet by mouth every morning. CALCIUM 500 MG + VITAMIN D 3 1000 UNITS., Disp: , Rfl:  carvedilol (COREG) 6.25 MG tablet, Take 1 tablet (6.25 mg total) by mouth 2 (two) times daily with a meal., Disp: 60 tablet, Rfl: 11;  Cholecalciferol (VITAMIN D) 1000 UNITS capsule, Take 1,000 Units by mouth daily., Disp: , Rfl: ;  furosemide (LASIX) 20 MG tablet, Take 20 mg by mouth every other day., Disp: , Rfl: ;  irbesartan (AVAPRO) 300 MG tablet, Take 1 tablet (300 mg total) by mouth daily., Disp: 30 tablet, Rfl: 2 mupirocin ointment (BACTROBAN) 2 %, Apply topically 3 (three) times daily., Disp: 22 g, Rfl: 0;  potassium chloride SA (K-DUR,KLOR-CON) 20 MEQ tablet, Take 20 mEq by mouth every other day., Disp: , Rfl: ;  romiPLOStim (NPLATE) 250 MCG injection, Inject 1 mcg/kg into the skin as needed. , Disp: , Rfl: ;  sertraline (ZOLOFT) 25 MG tablet, Take 1 tablet (25 mg total) by mouth daily., Disp: 30 tablet, Rfl: 11 UNKNOWN  TO PATIENT, 11-11-13  PT GIVEN MEDICATION LIST TO TAKE HOME, AND HAVE HER NIECE CALL us WITH CORRECT LIST AND DOSES., Disp: , Rfl:  No current facility-administered medications for this visit. Facility-Administered Medications Ordered in Other Visits: diphenhydrAMINE (BENADRYL) tablet 25 mg, 25 mg, Oral, Q6H PRN, Volanda Napoleon, MD, 25 mg at 01/28/13 0913  Allergies:  Allergies  Allergen Reactions  . Ace Inhibitors Cough    Past Medical History, Surgical history, Social history, and Family History were reviewed and updated.  Review of Systems: As above  Physical Exam:  height is 4\' 11"  (1.499 m) and weight is 123 lb (55.792 kg). Her oral temperature is 97.3 F (36.3 C). Her blood pressure is 163/58 and her pulse is 53. Her respiration is 14.   Elderly, petit white female. Head and neck exam shows no ocular or oral lesions. He is no palpable cervical or supraclavicular lymph nodes. Lungs are clear. Cardiac exam regular rate rhythm with no murmurs rubs or bruits. Abdomen is soft. Has good bowel sounds. There is no fluid wave. There is no palpable hepatospleno megaly. Skin exam no rashes, ecchymosis or petechia. Extremities shows age related muscle changes and joint changes. She has decent strength. Neurological exam shows no focal deficits.  Lab Results  Component Value Date   WBC 5.3 11/11/2013   HGB 12.2 11/11/2013   HCT 35.5 11/11/2013   MCV 98 11/11/2013   PLT 68* 11/11/2013     Chemistry  Component Value Date/Time   NA 138 10/28/2013 1335   NA 142 01/21/2013 1157   K 4.7 10/28/2013 1335   K 5.3* 01/21/2013 1157   CL 102 10/28/2013 1335   CL 103 01/21/2013 1157   CO2 25 10/28/2013 1335   CO2 27 01/21/2013 1157   BUN 27* 10/28/2013 1335   BUN 32* 01/21/2013 1157   CREATININE 1.32* 10/28/2013 1335   CREATININE 1.54* 11/22/2012 1022      Component Value Date/Time   CALCIUM 9.5 10/28/2013 1335   CALCIUM 9.8 01/21/2013 1157   ALKPHOS 93 10/28/2013 1335   ALKPHOS 67 01/21/2013 1157   AST 17 10/28/2013  1335   AST 24 01/21/2013 1157   ALT 12 10/28/2013 1335   ALT 18 01/21/2013 1157   BILITOT 1.3* 10/28/2013 1335   BILITOT 1.10 01/21/2013 1157         Impression and Plan: Lisa James is 80 year old white female with thrombocytopenia. This is likely immune-based. She's had absent no complications from this for a couple years. She's had no bleeding. He's had no neurological issues.  We will go ahead and give her a dose of Nplate today.  I typically have her go to the main cancer Center every 3 weeks. Am not sure she's making a there were not.  I will see her back here in about 2 months.   Volanda Napoleon, MD 4/24/20156:07 PM

## 2013-11-16 ENCOUNTER — Ambulatory Visit
Admission: RE | Admit: 2013-11-16 | Discharge: 2013-11-16 | Disposition: A | Discharge status: Home / Self Care | Payer: 59 | Source: Ambulatory Visit | Attending: Emergency Medicine | Admitting: Emergency Medicine

## 2013-11-16 ENCOUNTER — Telehealth: Payer: Self-pay | Admitting: Radiology

## 2013-11-16 DIAGNOSIS — S065XAA Traumatic subdural hemorrhage with loss of consciousness status unknown, initial encounter: Secondary | ICD-10-CM

## 2013-11-16 DIAGNOSIS — S065X9A Traumatic subdural hemorrhage with loss of consciousness of unspecified duration, initial encounter: Secondary | ICD-10-CM

## 2013-11-16 DIAGNOSIS — I6200 Nontraumatic subdural hemorrhage, unspecified: Secondary | ICD-10-CM

## 2013-11-16 NOTE — Telephone Encounter (Signed)
Left message for Dr Marin Olp to call you back concerning patient, have left message on nursing line. It will be tomorrow, he leaves at noon on Tuesday.

## 2013-11-16 NOTE — Telephone Encounter (Signed)
Have spoken to niece Anne Ng, and so has Dr Everlene Farrier, she is aware of appointment with the neurosurgeon.

## 2013-11-16 NOTE — Telephone Encounter (Signed)
Patient has had CT scan, Dr Everlene Farrier took the call report patients subdural has increased in size. Dr Sherwood Gambler will see patient tomorrow at 8am. Left message for niece Anne Ng to advise. She should call me back.

## 2013-11-18 ENCOUNTER — Telehealth: Payer: Self-pay | Admitting: Hematology & Oncology

## 2013-11-18 NOTE — Telephone Encounter (Signed)
Niece moved 6-19 to 6-18 due to transportation

## 2013-11-30 ENCOUNTER — Other Ambulatory Visit: Payer: Self-pay | Admitting: Neurosurgery

## 2013-11-30 ENCOUNTER — Telehealth: Payer: Self-pay

## 2013-11-30 DIAGNOSIS — I6200 Nontraumatic subdural hemorrhage, unspecified: Secondary | ICD-10-CM

## 2013-11-30 NOTE — Telephone Encounter (Signed)
Dr. Everlene Farrier, I need a diagnosis code for the add on Vit B12 that we did on 10-28-13 please. Thanks

## 2013-12-01 NOTE — Telephone Encounter (Signed)
Chronic kidney disease and vit D deficiency

## 2013-12-07 ENCOUNTER — Inpatient Hospital Stay: Admission: RE | Admit: 2013-12-07 | Payer: Medicare Other | Source: Ambulatory Visit

## 2013-12-09 ENCOUNTER — Ambulatory Visit (HOSPITAL_BASED_OUTPATIENT_CLINIC_OR_DEPARTMENT_OTHER): Payer: Medicare Other

## 2013-12-09 ENCOUNTER — Other Ambulatory Visit (HOSPITAL_BASED_OUTPATIENT_CLINIC_OR_DEPARTMENT_OTHER): Payer: Medicare Other

## 2013-12-09 VITALS — BP 113/77 | HR 71 | Temp 98.2°F

## 2013-12-09 DIAGNOSIS — D693 Immune thrombocytopenic purpura: Secondary | ICD-10-CM

## 2013-12-09 DIAGNOSIS — D473 Essential (hemorrhagic) thrombocythemia: Secondary | ICD-10-CM

## 2013-12-09 LAB — CBC WITH DIFFERENTIAL/PLATELET
BASO%: 0.2 % (ref 0.0–2.0)
Basophils Absolute: 0 10e3/uL (ref 0.0–0.1)
EOS%: 5.1 % (ref 0.0–7.0)
Eosinophils Absolute: 0.3 10e3/uL (ref 0.0–0.5)
HCT: 37.5 % (ref 34.8–46.6)
HGB: 12.9 g/dL (ref 11.6–15.9)
LYMPH%: 18.1 % (ref 14.0–49.7)
MCH: 33.1 pg (ref 25.1–34.0)
MCHC: 34.4 g/dL (ref 31.5–36.0)
MCV: 96.2 fL (ref 79.5–101.0)
MONO#: 0.5 10e3/uL (ref 0.1–0.9)
MONO%: 8.1 % (ref 0.0–14.0)
NEUT#: 4.4 10e3/uL (ref 1.5–6.5)
NEUT%: 68.5 % (ref 38.4–76.8)
Platelets: 54 10e3/uL — ABNORMAL LOW (ref 145–400)
RBC: 3.9 10e6/uL (ref 3.70–5.45)
RDW: 13.3 % (ref 11.2–14.5)
WBC: 6.4 10e3/uL (ref 3.9–10.3)
lymph#: 1.2 10e3/uL (ref 0.9–3.3)

## 2013-12-09 MED ORDER — ROMIPLOSTIM 250 MCG ~~LOC~~ SOLR
55.0000 ug | Freq: Once | SUBCUTANEOUS | Status: AC
  Administered 2013-12-09: 55 ug via SUBCUTANEOUS
  Filled 2013-12-09: qty 0.11

## 2013-12-14 ENCOUNTER — Ambulatory Visit
Admission: RE | Admit: 2013-12-14 | Discharge: 2013-12-14 | Disposition: A | Discharge status: Home / Self Care | Payer: 59 | Source: Ambulatory Visit | Attending: Neurosurgery | Admitting: Neurosurgery

## 2013-12-14 DIAGNOSIS — I6200 Nontraumatic subdural hemorrhage, unspecified: Secondary | ICD-10-CM

## 2013-12-20 ENCOUNTER — Other Ambulatory Visit: Payer: Self-pay | Admitting: Emergency Medicine

## 2013-12-21 ENCOUNTER — Other Ambulatory Visit: Payer: Self-pay | Admitting: *Deleted

## 2013-12-21 MED ORDER — IRBESARTAN 300 MG PO TABS: 300.0000 mg | ORAL_TABLET | Freq: Every day | ORAL | Status: DC

## 2013-12-21 NOTE — Telephone Encounter (Signed)
Rx refill sent to patient pharmacy   

## 2013-12-22 NOTE — Telephone Encounter (Signed)
Dr Everlene Farrier, you saw pt for chronic issues in Apr, but don't see depression discussed. OK for RFs?

## 2014-01-06 ENCOUNTER — Encounter: Payer: Self-pay | Admitting: Hematology & Oncology

## 2014-01-06 ENCOUNTER — Ambulatory Visit (HOSPITAL_BASED_OUTPATIENT_CLINIC_OR_DEPARTMENT_OTHER): Payer: Medicare Other

## 2014-01-06 ENCOUNTER — Ambulatory Visit (HOSPITAL_BASED_OUTPATIENT_CLINIC_OR_DEPARTMENT_OTHER): Payer: Medicare Other | Admitting: Hematology & Oncology

## 2014-01-06 ENCOUNTER — Other Ambulatory Visit (HOSPITAL_BASED_OUTPATIENT_CLINIC_OR_DEPARTMENT_OTHER): Payer: Medicare Other | Admitting: Lab

## 2014-01-06 VITALS — BP 163/55 | HR 59 | Temp 97.9°F | Resp 14 | Ht 59.0 in | Wt 119.0 lb

## 2014-01-06 DIAGNOSIS — D696 Thrombocytopenia, unspecified: Secondary | ICD-10-CM

## 2014-01-06 DIAGNOSIS — D693 Immune thrombocytopenic purpura: Secondary | ICD-10-CM

## 2014-01-06 LAB — CBC WITH DIFFERENTIAL (CANCER CENTER ONLY)
BASO#: 0 10*3/uL (ref 0.0–0.2)
BASO%: 0.4 % (ref 0.0–2.0)
EOS%: 6.1 % (ref 0.0–7.0)
Eosinophils Absolute: 0.5 10*3/uL (ref 0.0–0.5)
HEMATOCRIT: 38.1 % (ref 34.8–46.6)
HEMOGLOBIN: 13.2 g/dL (ref 11.6–15.9)
LYMPH#: 1.3 10*3/uL (ref 0.9–3.3)
LYMPH%: 15.8 % (ref 14.0–48.0)
MCH: 34.3 pg — AB (ref 26.0–34.0)
MCHC: 34.6 g/dL (ref 32.0–36.0)
MCV: 99 fL (ref 81–101)
MONO#: 0.6 10*3/uL (ref 0.1–0.9)
MONO%: 7.8 % (ref 0.0–13.0)
NEUT#: 5.6 10*3/uL (ref 1.5–6.5)
NEUT%: 69.9 % (ref 39.6–80.0)
Platelets: 51 10*3/uL — ABNORMAL LOW (ref 145–400)
RBC: 3.85 10*6/uL (ref 3.70–5.32)
RDW: 12.6 % (ref 11.1–15.7)
WBC: 8 10*3/uL (ref 3.9–10.0)

## 2014-01-06 MED ORDER — ROMIPLOSTIM 250 MCG ~~LOC~~ SOLR
160.0000 ug | Freq: Once | SUBCUTANEOUS | Status: AC
  Administered 2014-01-06: 160 ug via SUBCUTANEOUS
  Filled 2014-01-06: qty 0.32

## 2014-01-06 NOTE — Patient Instructions (Signed)
Romiplostim injection What is this medicine? ROMIPLOSTIM (roe mi PLOE stim) helps your body make more platelets. This medicine is used to treat low platelets caused by chronic idiopathic thrombocytopenic purpura (ITP). This medicine may be used for other purposes; ask your health care provider or pharmacist if you have questions. COMMON BRAND NAME(S): Nplate What should I tell my health care provider before I take this medicine? They need to know if you have any of these conditions: -cancer or myelodysplastic syndrome -low blood counts, like low white cell, platelet, or red cell counts -take medicines that treat or prevent blood clots -an unusual or allergic reaction to romiplostim, mannitol, other medicines, foods, dyes, or preservatives -pregnant or trying to get pregnant -breast-feeding How should I use this medicine? This medicine is for injection under the skin. It is given by a health care professional in a hospital or clinic setting. A special MedGuide will be given to you before your injection. Read this information carefully each time. Talk to your pediatrician regarding the use of this medicine in children. Special care may be needed. Overdosage: If you think you have taken too much of this medicine contact a poison control center or emergency room at once. NOTE: This medicine is only for you. Do not share this medicine with others. What if I miss a dose? It is important not to miss your dose. Call your doctor or health care professional if you are unable to keep an appointment. What may interact with this medicine? Interactions are not expected. This list may not describe all possible interactions. Give your health care provider a list of all the medicines, herbs, non-prescription drugs, or dietary supplements you use. Also tell them if you smoke, drink alcohol, or use illegal drugs. Some items may interact with your medicine. What should I watch for while using this  medicine? Your condition will be monitored carefully while you are receiving this medicine. Visit your prescriber or health care professional for regular checks on your progress and for the needed blood tests. It is important to keep all appointments. What side effects may I notice from receiving this medicine? Side effects that you should report to your doctor or health care professional as soon as possible: -allergic reactions like skin rash, itching or hives, swelling of the face, lips, or tongue -shortness of breath, chest pain, swelling in a leg -unusual bleeding or bruising Side effects that usually do not require medical attention (report to your doctor or health care professional if they continue or are bothersome): -dizziness -headache -muscle aches -pain in arms and legs -stomach pain -trouble sleeping This list may not describe all possible side effects. Call your doctor for medical advice about side effects. You may report side effects to FDA at 1-800-FDA-1088. Where should I keep my medicine? This drug is given in a hospital or clinic and will not be stored at home. NOTE: This sheet is a summary. It may not cover all possible information. If you have questions about this medicine, talk to your doctor, pharmacist, or health care provider.  2015, Elsevier/Gold Standard. (2008-03-07 15:13:04)  

## 2014-01-07 ENCOUNTER — Ambulatory Visit: Payer: Medicare Other

## 2014-01-07 ENCOUNTER — Other Ambulatory Visit: Payer: Medicare Other | Admitting: Lab

## 2014-01-07 ENCOUNTER — Ambulatory Visit: Payer: Medicare Other | Admitting: Hematology & Oncology

## 2014-01-07 NOTE — Progress Notes (Signed)
Hematology and Oncology Follow Up Visit  Lisa James [id number] [birth year] 80 y.o. 01/07/2014   Principle Diagnosis:   Thrombocytopenia-likely immune thrombocytopenia  Current Therapy:   Nplate as indicated    Interim History:  Ms.  James is back for followup. We last saw her back in April.. She has been doing okay. There's been no obvious bleeding orbruising. She's had no neurological issues. She has been active at the nursing home. She's been doing well there.  She's had no fevers with chills. She's had no cough. She's had no bony pain. There's been no problems with her appetite. She's had no change in bowel or bladder habits.  She did have a CT scan done in April of we saw her. This did seem to show a slight increase in the subdural hematoma. Again she was asymptomatic. She had a followup CT scan in late May. This showed stability. Again she is asymptomatic.  Medications: Current outpatient prescriptions:atorvastatin (LIPITOR) 20 MG tablet, Take 1 tablet (20 mg total) by mouth daily., Disp: 30 tablet, Rfl: 11;  calcium carbonate (TUMS - DOSED IN MG ELEMENTAL CALCIUM) 500 MG chewable tablet, Chew 1 tablet by mouth daily., Disp: , Rfl: ;  Calcium Carbonate-Vitamin D (CALCIUM-VITAMIN D) 500-200 MG-UNIT per tablet, Take 1 tablet by mouth every morning. CALCIUM 500 MG + VITAMIN D 3 1000 UNITS., Disp: , Rfl:  carvedilol (COREG) 6.25 MG tablet, Take 1 tablet (6.25 mg total) by mouth 2 (two) times daily with a meal., Disp: 60 tablet, Rfl: 11;  Cholecalciferol (VITAMIN D) 1000 UNITS capsule, Take 1,000 Units by mouth daily., Disp: , Rfl: ;  furosemide (LASIX) 20 MG tablet, Take 20 mg by mouth every other day., Disp: , Rfl: ;  irbesartan (AVAPRO) 300 MG tablet, Take 1 tablet (300 mg total) by mouth daily., Disp: 30 tablet, Rfl: 4 mupirocin ointment (BACTROBAN) 2 %, Apply topically 3 (three) times daily., Disp: 22 g, Rfl: 0;  potassium chloride SA (K-DUR,KLOR-CON) 20 MEQ tablet, Take 20 mEq by  mouth every other day., Disp: , Rfl: ;  romiPLOStim (NPLATE) 250 MCG injection, Inject 1 mcg/kg into the skin as needed. , Disp: , Rfl: ;  sertraline (ZOLOFT) 25 MG tablet, TAKE 1 TABLET (25 MG TOTAL) BY MOUTH DAILY., Disp: 30 tablet, Rfl: 4 UNKNOWN TO PATIENT, 01-06-14  PT GIVEN MEDICATION LIST TO TAKE HOME, AND HAVE HER FACILITY CALL us WITH CORRECT LIST AND DOSES., Disp: , Rfl:  No current facility-administered medications for this visit. Facility-Administered Medications Ordered in Other Visits: diphenhydrAMINE (BENADRYL) tablet 25 mg, 25 mg, Oral, Q6H PRN, Volanda Napoleon, MD, 25 mg at 01/28/13 0913  Allergies:  Allergies  Allergen Reactions  . Ace Inhibitors Cough    Past Medical History, Surgical history, Social history, and Family History were reviewed and updated.  Review of Systems: As above  Physical Exam:  height is 4\' 11"  (1.499 m) and weight is 119 lb (53.978 kg). Her oral temperature is 97.9 F (36.6 C). Her blood pressure is 163/55 and her pulse is 59. Her respiration is 14.   Elderly, petit white female. Head and neck exam shows no ocular or oral lesions. He is no palpable cervical or supraclavicular lymph nodes. Lungs are clear. Cardiac exam regular rate rhythm with no murmurs rubs or bruits. Abdomen is soft. Has good bowel sounds. There is no fluid wave. There is no palpable hepatospleno megaly. Skin exam no rashes, ecchymosis or petechia. Extremities shows age related muscle changes and joint changes. She has  decent strength. Neurological exam shows no focal deficits.  Lab Results  Component Value Date   WBC 8.0 01/06/2014   HGB 13.2 01/06/2014   HCT 38.1 01/06/2014   MCV 99 01/06/2014   PLT 51* 01/06/2014     Chemistry      Component Value Date/Time   NA 138 10/28/2013 1335   NA 142 01/21/2013 1157   K 4.7 10/28/2013 1335   K 5.3* 01/21/2013 1157   CL 102 10/28/2013 1335   CL 103 01/21/2013 1157   CO2 25 10/28/2013 1335   CO2 27 01/21/2013 1157   BUN 27* 10/28/2013 1335   BUN  32* 01/21/2013 1157   CREATININE 1.32* 10/28/2013 1335   CREATININE 1.54* 11/22/2012 1022      Component Value Date/Time   CALCIUM 9.5 10/28/2013 1335   CALCIUM 9.8 01/21/2013 1157   ALKPHOS 93 10/28/2013 1335   ALKPHOS 67 01/21/2013 1157   AST 17 10/28/2013 1335   AST 24 01/21/2013 1157   ALT 12 10/28/2013 1335   ALT 18 01/21/2013 1157   BILITOT 1.3* 10/28/2013 1335   BILITOT 1.10 01/21/2013 1157         Impression and Plan: Lisa James is 80 year old white female with thrombocytopenia. This is likely immune-based. She's had no complications from this for a couple years. She's had no bleeding. She has had no neurological issues.   Her blood count is trending downward.    I do think that we have to increase the frequency of the Nplate injections. I think every couple weeks will be appropriate. I will increase her dose today. We will go ahead and give her a dose of Nplate today.  I want her to come back to see [place] in 2 weeks. I want to make sure that we see her back here. I will see her back here in about 6 weeks.Volanda Napoleon, MD 6/19/20158:20 AM

## 2014-01-19 ENCOUNTER — Other Ambulatory Visit: Payer: Self-pay | Admitting: Oncology

## 2014-01-20 ENCOUNTER — Other Ambulatory Visit (HOSPITAL_BASED_OUTPATIENT_CLINIC_OR_DEPARTMENT_OTHER): Payer: Medicare Other | Admitting: Lab

## 2014-01-20 ENCOUNTER — Ambulatory Visit (HOSPITAL_BASED_OUTPATIENT_CLINIC_OR_DEPARTMENT_OTHER): Payer: Medicare Other

## 2014-01-20 VITALS — BP 139/70 | HR 66 | Temp 97.0°F | Resp 14

## 2014-01-20 DIAGNOSIS — D693 Immune thrombocytopenic purpura: Secondary | ICD-10-CM

## 2014-01-20 LAB — CBC WITH DIFFERENTIAL (CANCER CENTER ONLY)
BASO#: 0 10*3/uL (ref 0.0–0.2)
BASO%: 0.5 % (ref 0.0–2.0)
EOS%: 5.4 % (ref 0.0–7.0)
Eosinophils Absolute: 0.4 10*3/uL (ref 0.0–0.5)
HCT: 35.5 % (ref 34.8–46.6)
HEMOGLOBIN: 12.6 g/dL (ref 11.6–15.9)
LYMPH#: 1.3 10*3/uL (ref 0.9–3.3)
LYMPH%: 16.6 % (ref 14.0–48.0)
MCH: 34.4 pg — AB (ref 26.0–34.0)
MCHC: 35.5 g/dL (ref 32.0–36.0)
MCV: 97 fL (ref 81–101)
MONO#: 0.8 10*3/uL (ref 0.1–0.9)
MONO%: 10.2 % (ref 0.0–13.0)
NEUT%: 67.3 % (ref 39.6–80.0)
NEUTROS ABS: 5.2 10*3/uL (ref 1.5–6.5)
Platelets: 145 10*3/uL (ref 145–400)
RBC: 3.66 10*6/uL — ABNORMAL LOW (ref 3.70–5.32)
RDW: 12.6 % (ref 11.1–15.7)
WBC: 7.7 10*3/uL (ref 3.9–10.0)

## 2014-01-20 MED ORDER — ROMIPLOSTIM 250 MCG ~~LOC~~ SOLR
2.0000 ug/kg | Freq: Once | SUBCUTANEOUS | Status: AC
  Administered 2014-01-20: 110 ug via SUBCUTANEOUS
  Filled 2014-01-20: qty 0.22

## 2014-01-27 ENCOUNTER — Other Ambulatory Visit: Payer: Self-pay | Admitting: Emergency Medicine

## 2014-02-03 ENCOUNTER — Encounter: Payer: Self-pay | Admitting: Hematology & Oncology

## 2014-02-03 ENCOUNTER — Ambulatory Visit (HOSPITAL_BASED_OUTPATIENT_CLINIC_OR_DEPARTMENT_OTHER): Payer: Medicare Other | Admitting: Hematology & Oncology

## 2014-02-03 ENCOUNTER — Ambulatory Visit (HOSPITAL_BASED_OUTPATIENT_CLINIC_OR_DEPARTMENT_OTHER): Payer: Medicare Other

## 2014-02-03 ENCOUNTER — Other Ambulatory Visit (HOSPITAL_BASED_OUTPATIENT_CLINIC_OR_DEPARTMENT_OTHER): Payer: Medicare Other | Admitting: Lab

## 2014-02-03 VITALS — BP 140/57 | HR 67 | Temp 97.9°F | Resp 14 | Ht 59.0 in | Wt 119.0 lb

## 2014-02-03 DIAGNOSIS — D696 Thrombocytopenia, unspecified: Secondary | ICD-10-CM

## 2014-02-03 DIAGNOSIS — D693 Immune thrombocytopenic purpura: Secondary | ICD-10-CM

## 2014-02-03 DIAGNOSIS — L089 Local infection of the skin and subcutaneous tissue, unspecified: Secondary | ICD-10-CM

## 2014-02-03 LAB — CBC WITH DIFFERENTIAL (CANCER CENTER ONLY)
BASO#: 0 10*3/uL (ref 0.0–0.2)
BASO%: 0.5 % (ref 0.0–2.0)
EOS ABS: 0.5 10*3/uL (ref 0.0–0.5)
EOS%: 6 % (ref 0.0–7.0)
HEMATOCRIT: 36.1 % (ref 34.8–46.6)
HEMOGLOBIN: 12.7 g/dL (ref 11.6–15.9)
LYMPH#: 1.2 10*3/uL (ref 0.9–3.3)
LYMPH%: 15 % (ref 14.0–48.0)
MCH: 34 pg (ref 26.0–34.0)
MCHC: 35.2 g/dL (ref 32.0–36.0)
MCV: 97 fL (ref 81–101)
MONO#: 0.7 10*3/uL (ref 0.1–0.9)
MONO%: 8.8 % (ref 0.0–13.0)
NEUT#: 5.7 10*3/uL (ref 1.5–6.5)
NEUT%: 69.7 % (ref 39.6–80.0)
Platelets: 144 10*3/uL — ABNORMAL LOW (ref 145–400)
RBC: 3.73 10*6/uL (ref 3.70–5.32)
RDW: 12.3 % (ref 11.1–15.7)
WBC: 8.2 10*3/uL (ref 3.9–10.0)

## 2014-02-03 MED ORDER — MUPIROCIN 2 % EX OINT: TOPICAL_OINTMENT | CUTANEOUS | Status: DC

## 2014-02-03 MED ORDER — ROMIPLOSTIM 250 MCG ~~LOC~~ SOLR
2.0000 ug/kg | Freq: Once | SUBCUTANEOUS | Status: AC
  Administered 2014-02-03: 110 ug via SUBCUTANEOUS
  Filled 2014-02-03: qty 0.22

## 2014-02-03 NOTE — Patient Instructions (Signed)
Romiplostim injection What is this medicine? ROMIPLOSTIM (roe mi PLOE stim) helps your body make more platelets. This medicine is used to treat low platelets caused by chronic idiopathic thrombocytopenic purpura (ITP). This medicine may be used for other purposes; ask your health care provider or pharmacist if you have questions. COMMON BRAND NAME(S): Nplate What should I tell my health care provider before I take this medicine? They need to know if you have any of these conditions: -cancer or myelodysplastic syndrome -low blood counts, like low white cell, platelet, or red cell counts -take medicines that treat or prevent blood clots -an unusual or allergic reaction to romiplostim, mannitol, other medicines, foods, dyes, or preservatives -pregnant or trying to get pregnant -breast-feeding How should I use this medicine? This medicine is for injection under the skin. It is given by a health care professional in a hospital or clinic setting. A special MedGuide will be given to you before your injection. Read this information carefully each time. Talk to your pediatrician regarding the use of this medicine in children. Special care may be needed. Overdosage: If you think you have taken too much of this medicine contact a poison control center or emergency room at once. NOTE: This medicine is only for you. Do not share this medicine with others. What if I miss a dose? It is important not to miss your dose. Call your doctor or health care professional if you are unable to keep an appointment. What may interact with this medicine? Interactions are not expected. This list may not describe all possible interactions. Give your health care provider a list of all the medicines, herbs, non-prescription drugs, or dietary supplements you use. Also tell them if you smoke, drink alcohol, or use illegal drugs. Some items may interact with your medicine. What should I watch for while using this  medicine? Your condition will be monitored carefully while you are receiving this medicine. Visit your prescriber or health care professional for regular checks on your progress and for the needed blood tests. It is important to keep all appointments. What side effects may I notice from receiving this medicine? Side effects that you should report to your doctor or health care professional as soon as possible: -allergic reactions like skin rash, itching or hives, swelling of the face, lips, or tongue -shortness of breath, chest pain, swelling in a leg -unusual bleeding or bruising Side effects that usually do not require medical attention (report to your doctor or health care professional if they continue or are bothersome): -dizziness -headache -muscle aches -pain in arms and legs -stomach pain -trouble sleeping This list may not describe all possible side effects. Call your doctor for medical advice about side effects. You may report side effects to FDA at 1-800-FDA-1088. Where should I keep my medicine? This drug is given in a hospital or clinic and will not be stored at home. NOTE: This sheet is a summary. It may not cover all possible information. If you have questions about this medicine, talk to your doctor, pharmacist, or health care provider.  2015, Elsevier/Gold Standard. (2008-03-07 15:13:04)  

## 2014-02-03 NOTE — Progress Notes (Signed)
Hematology and Oncology Follow Up Visit  Lisa James [id number] [birth year] 80 y.o. 02/03/2014   Principle Diagnosis:   Thrombocytopenia-likely immune thrombocytopenia  Current Therapy:    Nplate as indicated     Interim History:  Ms.  James is back for followup. Since we have increased the frequency of the Nplate administration, her platelet count is staying up.  She feels well. She has not had any probably. There's been no headache. She's had no cough. She's had no bony pain. There's been no change in bowel or bladder habits.  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 She really enjoys herself there.  Medications: Current outpatient prescriptions:atorvastatin (LIPITOR) 20 MG tablet, TAKE 1 TABLET (20 MG TOTAL) BY MOUTH DAILY., Disp: 30 tablet, Rfl: 9;  calcium carbonate (TUMS - DOSED IN MG ELEMENTAL CALCIUM) 500 MG chewable tablet, Chew 1 tablet by mouth daily., Disp: , Rfl: ;  Calcium Carbonate-Vitamin D (CALCIUM-VITAMIN D) 500-200 MG-UNIT per tablet, Take 1 tablet by mouth every morning. CALCIUM 500 MG + VITAMIN D 3 1000 UNITS., Disp: , Rfl:  carvedilol (COREG) 6.25 MG tablet, TAKE 1 TABLET (6.25 MG TOTAL) BY MOUTH 2 (TWO) TIMES DAILY WITH A MEAL., Disp: 60 tablet, Rfl: 9;  Cholecalciferol (VITAMIN D) 1000 UNITS capsule, Take 1,000 Units by mouth daily., Disp: , Rfl: ;  furosemide (LASIX) 20 MG tablet, TAKE 1 TABLET (20 MG TOTAL) BY MOUTH DAILY., Disp: 30 tablet, Rfl: 6;  irbesartan (AVAPRO) 300 MG tablet, Take 1 tablet (300 mg total) by mouth daily., Disp: 30 tablet, Rfl: 4 KLOR-CON M20 20 MEQ tablet, TAKE 1 TABLET (20 MEQ TOTAL) BY MOUTH DAILY., Disp: 30 tablet, Rfl: 4;  mupirocin ointment (BACTROBAN) 2 %, Apply topically 3 (three) times daily., Disp: 22 g, Rfl: 0;  romiPLOStim (NPLATE) 250 MCG injection, Inject 1 mcg/kg into the skin as needed. , Disp: , Rfl: ;  sertraline (ZOLOFT) 25 MG tablet, TAKE 1 TABLET (25 MG TOTAL) BY MOUTH DAILY., Disp: 30 tablet, Rfl: 4 UNKNOWN TO  PATIENT, 02-03-14  PT GIVEN MEDICATION LIST TO TAKE HOME, AND HAVE HER FACILITY CALL us WITH CORRECT LIST AND DOSES., Disp: , Rfl: ;  mupirocin ointment (BACTROBAN) 2 %, Apply to left ear lobe 3 times a day, Disp: 22 g, Rfl: 0 No current facility-administered medications for this visit. Facility-Administered Medications Ordered in Other Visits: diphenhydrAMINE (BENADRYL) tablet 25 mg, 25 mg, Oral, Q6H PRN, Volanda Napoleon, MD, 25 mg at 01/28/13 0913  Allergies:  Allergies  Allergen Reactions  . Ace Inhibitors Cough    Past Medical History, Surgical history, Social history, and Family History were reviewed and updated.  Review of Systems: As above  Physical Exam:  height is 4\' 11"  (1.499 m) and weight is 119 lb (53.978 kg). Her oral temperature is 97.9 F (36.6 C). Her blood pressure is 140/57 and her pulse is 67. Her respiration is 14.   Totally, petite white female in no obvious distress. Head and neck exam shows no ocular or oral lesions. Pupils reacted properly. Extraocular muscles are intact. There is no adenopathy in the neck. Lungs are clear. Cardiac exam regular in rhythm. She is a 1/6 systolic ejection murmur. Abdomen is soft. She has good bowel sounds. There is no fluid wave. There is no palpable liver or spleen tip. Back exam shows no tenderness over the spine. She has some slight kyphosis. Extremities shows no clubbing cyanosis or edema. His age related osteoarthritic changes. She has good strength bilaterally. Neurological exam  is without any deficits.  Lab Results  Component Value Date   WBC 8.2 02/03/2014   HGB 12.7 02/03/2014   HCT 36.1 02/03/2014   MCV 97 02/03/2014   PLT 144* 02/03/2014     Chemistry      Component Value Date/Time   NA 138 10/28/2013 1335   NA 142 01/21/2013 1157   K 4.7 10/28/2013 1335   K 5.3* 01/21/2013 1157   CL 102 10/28/2013 1335   CL 103 01/21/2013 1157   CO2 25 10/28/2013 1335   CO2 27 01/21/2013 1157   BUN 27* 10/28/2013 1335   BUN 32* 01/21/2013 1157    CREATININE 1.32* 10/28/2013 1335   CREATININE 1.54* 11/22/2012 1022      Component Value Date/Time   CALCIUM 9.5 10/28/2013 1335   CALCIUM 9.8 01/21/2013 1157   ALKPHOS 93 10/28/2013 1335   ALKPHOS 67 01/21/2013 1157   AST 17 10/28/2013 1335   AST 24 01/21/2013 1157   ALT 12 10/28/2013 1335   ALT 18 01/21/2013 1157   BILITOT 1.3* 10/28/2013 1335   BILITOT 1.10 01/21/2013 1157         Impression and Plan: Lisa James is a 89-year-old female with thrombocytopenia. This is likely immune thrombocytopenia. I actually did do a bone marrow on her last year.  Again her platelet count is staying up. We are trying to get her in every 2 weeks now.  We will continue to have her come in every 2 weeks.  We will give her a dose today.  May be, if we do get her platelet counts better, we might be able to spread her appointment out more.     Volanda Napoleon, MD 7/16/20153:45 PM

## 2014-02-23 ENCOUNTER — Other Ambulatory Visit: Payer: Self-pay | Admitting: Emergency Medicine

## 2014-02-24 ENCOUNTER — Other Ambulatory Visit (HOSPITAL_BASED_OUTPATIENT_CLINIC_OR_DEPARTMENT_OTHER): Payer: Medicare Other | Admitting: Lab

## 2014-02-24 ENCOUNTER — Other Ambulatory Visit: Payer: Medicare Other | Admitting: Lab

## 2014-02-24 ENCOUNTER — Ambulatory Visit: Payer: Medicare Other

## 2014-02-24 ENCOUNTER — Ambulatory Visit: Payer: Medicare Other | Admitting: Family

## 2014-02-24 ENCOUNTER — Ambulatory Visit (HOSPITAL_BASED_OUTPATIENT_CLINIC_OR_DEPARTMENT_OTHER): Payer: Medicare Other

## 2014-02-24 ENCOUNTER — Other Ambulatory Visit: Payer: Self-pay | Admitting: Family

## 2014-02-24 ENCOUNTER — Ambulatory Visit (HOSPITAL_BASED_OUTPATIENT_CLINIC_OR_DEPARTMENT_OTHER): Payer: Medicare Other | Admitting: Family

## 2014-02-24 VITALS — HR 61 | Temp 96.7°F | Wt 120.0 lb

## 2014-02-24 DIAGNOSIS — D693 Immune thrombocytopenic purpura: Secondary | ICD-10-CM

## 2014-02-24 LAB — CBC WITH DIFFERENTIAL (CANCER CENTER ONLY)
BASO#: 0 10*3/uL (ref 0.0–0.2)
BASO%: 0.4 % (ref 0.0–2.0)
EOS%: 7.2 % — AB (ref 0.0–7.0)
Eosinophils Absolute: 0.6 10*3/uL — ABNORMAL HIGH (ref 0.0–0.5)
HCT: 36.2 % (ref 34.8–46.6)
HGB: 12.6 g/dL (ref 11.6–15.9)
LYMPH#: 1.1 10*3/uL (ref 0.9–3.3)
LYMPH%: 14.1 % (ref 14.0–48.0)
MCH: 34.1 pg — AB (ref 26.0–34.0)
MCHC: 34.8 g/dL (ref 32.0–36.0)
MCV: 98 fL (ref 81–101)
MONO#: 0.6 10*3/uL (ref 0.1–0.9)
MONO%: 8.1 % (ref 0.0–13.0)
NEUT%: 70.2 % (ref 39.6–80.0)
NEUTROS ABS: 5.5 10*3/uL (ref 1.5–6.5)
PLATELETS: 79 10*3/uL — AB (ref 145–400)
RBC: 3.69 10*6/uL — ABNORMAL LOW (ref 3.70–5.32)
RDW: 12.6 % (ref 11.1–15.7)
WBC: 7.9 10*3/uL (ref 3.9–10.0)

## 2014-02-24 MED ORDER — ROMIPLOSTIM 250 MCG ~~LOC~~ SOLR
2.0000 ug/kg | Freq: Once | SUBCUTANEOUS | Status: AC
  Administered 2014-02-24: 110 ug via SUBCUTANEOUS
  Filled 2014-02-24: qty 0.22

## 2014-02-24 NOTE — Progress Notes (Signed)
[place]  [phone] [phone]) [phone]  ID: Lisa James OB: 1928-06-23 MR#: [id number]#:[id number] Patient Care Team: Darlyne Russian, MD as PCP - General (Family Medicine)  DIAGNOSIS: Thrombocytopenia-likely immune thrombocytopenia  INTERVAL HISTORY: Lisa James is back for followup. Since we have increased the frequency of the Nplate administration, her platelet count is staying up.  She feels well. She has not had any problems. Her platelets today are 79. She last got Nplate on 0/01/74. She states that her energy and appetite are good. She has had no bleeding. She denies fever, chills, cough, rash, headache, dizziness, SOB, chest pain, palpitations, abdominal pain, constipation, diarrhea, blood in urine or stool. She has had no bone pain. She denies swelling, tenderness, numbness or tingling in her extremities. 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 They have a lot of activities that she participates in and she also walks every day. She really enjoys herself there.  CURRENT TREATMENT: Nplate as indicated  REVIEW OF SYSTEMS: All other 10 point review of systems is negative.  PAST MEDICAL HISTORY: Past Medical History  Diagnosis Date  . Acute MI, lateral wall   . Chronic systolic heart failure   . Ischemic heart disease   . Mitral regurgitation   . HTN (hypertension)   . DM (diabetes mellitus)   . Non Hodgkin's lymphoma     of the throat  . Immune thrombocytopenic purpura 01/21/2013   PAST SURGICAL HISTORY: Past Surgical History  Procedure Laterality Date  . Coronary artery bypass graft  11/20/2007    x5  . Bone marrow biopsy  11/22/2002    left posterior iliac crest bone marrow biopsy and aspirate  . Submental lymph node excisional biopsy  10/22/2002   FAMILY HISTORY Family History  Problem Relation Age of Onset  . Heart attack Father   . Diabetes Father    GYNECOLOGIC HISTORY:  No LMP recorded. Patient is postmenopausal.   SOCIAL  HISTORY:  History   Social History  . Marital Status: Widowed    Spouse Name: N/A    Number of Children: N/A  . Years of Education: N/A   Occupational History  . Not on file.   Social History Main Topics  . Smoking status: Never Smoker   . Smokeless tobacco: Never Used     Comment: never used tobacco.  . Alcohol Use: No  . Drug Use: No  . Sexual Activity: No   Other Topics Concern  . Not on file   Social History Narrative  . No narrative on file   ADVANCED DIRECTIVES: <no information>  HEALTH MAINTENANCE: History  Substance Use Topics  . Smoking status: Never Smoker   . Smokeless tobacco: Never Used     Comment: never used tobacco.  . Alcohol Use: No   Colonoscopy: PAP: Bone density: Lipid panel:  Allergies  Allergen Reactions  . Ace Inhibitors Cough    Current Outpatient Prescriptions  Medication Sig Dispense Refill  . atorvastatin (LIPITOR) 20 MG tablet TAKE 1 TABLET (20 MG TOTAL) BY MOUTH DAILY.  30 tablet  9  . calcium carbonate (TUMS - DOSED IN MG ELEMENTAL CALCIUM) 500 MG chewable tablet Chew 1 tablet by mouth daily.      . Calcium Carbonate-Vitamin D (CALCIUM-VITAMIN D) 500-200 MG-UNIT per tablet Take 1 tablet by mouth every morning. CALCIUM 500 MG + VITAMIN D 3 1000 UNITS.      Marland Kitchen carvedilol (COREG) 6.25 MG tablet TAKE 1 TABLET (6.25 MG TOTAL) BY  MOUTH 2 (TWO) TIMES DAILY WITH A MEAL.  60 tablet  9  . Cholecalciferol (VITAMIN D) 1000 UNITS capsule Take 1,000 Units by mouth daily.      . furosemide (LASIX) 20 MG tablet TAKE 1 TABLET (20 MG TOTAL) BY MOUTH DAILY.  30 tablet  6  . irbesartan (AVAPRO) 300 MG tablet Take 1 tablet (300 mg total) by mouth daily.  30 tablet  4  . KLOR-CON M20 20 MEQ tablet TAKE 1 TABLET (20 MEQ TOTAL) BY MOUTH DAILY.  30 tablet  4  . mupirocin ointment (BACTROBAN) 2 % Apply topically 3 (three) times daily.  22 g  0  . mupirocin ointment (BACTROBAN) 2 % Apply to left ear lobe 3 times a day  22 g  0  . romiPLOStim (NPLATE) 250  MCG injection Inject 1 mcg/kg into the skin as needed.       . sertraline (ZOLOFT) 25 MG tablet TAKE 1 TABLET (25 MG TOTAL) BY MOUTH DAILY.  30 tablet  4  . UNKNOWN TO PATIENT 02-03-14  PT GIVEN MEDICATION LIST TO TAKE HOME, AND HAVE HER FACILITY CALL us WITH CORRECT LIST AND DOSES.       No current facility-administered medications for this visit.   Facility-Administered Medications Ordered in Other Visits  Medication Dose Route Frequency Provider Last Rate Last Dose  . diphenhydrAMINE (BENADRYL) tablet 25 mg  25 mg Oral Q6H PRN Volanda Napoleon, MD   25 mg at 01/28/13 0913   OBJECTIVE: Filed Vitals:   02/24/14 0921  Pulse: 61  Temp: 96.7 F (35.9 C)   Body mass index is 24.22 kg/(m^2). ECOG FS:0 - Asymptomatic Ocular: Sclerae unicteric, pupils equal, round and reactive to light Ear-nose-throat: Oropharynx clear, dentition fair Lymphatic: No cervical or supraclavicular adenopathy Lungs no rales or rhonchi, good excursion bilaterally Heart regular rate and rhythm, no murmur appreciated Abd soft, nontender, positive bowel sounds MSK no focal spinal tenderness, no joint edema Neuro: non-focal, well-oriented, appropriate affect Breasts: Deferred  LAB RESULTS: CMP     Component Value Date/Time   NA 138 10/28/2013 1335   NA 142 01/21/2013 1157   K 4.7 10/28/2013 1335   K 5.3* 01/21/2013 1157   CL 102 10/28/2013 1335   CL 103 01/21/2013 1157   CO2 25 10/28/2013 1335   CO2 27 01/21/2013 1157   GLUCOSE 135* 10/28/2013 1335   GLUCOSE 123* 01/21/2013 1157   BUN 27* 10/28/2013 1335   BUN 32* 01/21/2013 1157   CREATININE 1.32* 10/28/2013 1335   CREATININE 1.54* 11/22/2012 1022   CALCIUM 9.5 10/28/2013 1335   CALCIUM 9.8 01/21/2013 1157   PROT 6.3 10/28/2013 1335   PROT 6.4 01/21/2013 1157   ALBUMIN 4.3 10/28/2013 1335   AST 17 10/28/2013 1335   AST 24 01/21/2013 1157   ALT 12 10/28/2013 1335   ALT 18 01/21/2013 1157   ALKPHOS 93 10/28/2013 1335   ALKPHOS 67 01/21/2013 1157   BILITOT 1.3* 10/28/2013 1335   BILITOT 1.10  01/21/2013 1157   GFRNONAA 30* 11/22/2012 1022   GFRAA 35* 11/22/2012 1022   No results found for this basename: SPEP, UPEP,  kappa and lambda light chains   Lab Results  Component Value Date   WBC 7.9 02/24/2014   NEUTROABS 5.5 02/24/2014   HGB 12.6 02/24/2014   HCT 36.2 02/24/2014   MCV 98 02/24/2014   PLT 79* 02/24/2014   No results found for this basename: LABCA2   No components found with this basename:  QQUIV146   No results found for this basename: INR,  in the last 168 hours  STUDIES: No results found.  ASSESSMENT/PLAN: Lisa James is a 80-year-old female with thrombocytopenia. This is likely immune thrombocytopenia. She had a bone marrow biopsy last year.   We are trying to get her in every 2 weeks now. Today her platelets are 79. She will get Nplate.  We will continue to have her come in every 2 weeks for now.  She is in agreement and knows to call with any questions or concerns. We can certainly see her sooner if need be.    Eliezer Bottom, NP 02/24/2014 9:47 AM

## 2014-02-24 NOTE — Patient Instructions (Signed)
Romiplostim injection What is this medicine? ROMIPLOSTIM (roe mi PLOE stim) helps your body make more platelets. This medicine is used to treat low platelets caused by chronic idiopathic thrombocytopenic purpura (ITP). This medicine may be used for other purposes; ask your health care provider or pharmacist if you have questions. COMMON BRAND NAME(S): Nplate What should I tell my health care provider before I take this medicine? They need to know if you have any of these conditions: -cancer or myelodysplastic syndrome -low blood counts, like low white cell, platelet, or red cell counts -take medicines that treat or prevent blood clots -an unusual or allergic reaction to romiplostim, mannitol, other medicines, foods, dyes, or preservatives -pregnant or trying to get pregnant -breast-feeding How should I use this medicine? This medicine is for injection under the skin. It is given by a health care professional in a hospital or clinic setting. A special MedGuide will be given to you before your injection. Read this information carefully each time. Talk to your pediatrician regarding the use of this medicine in children. Special care may be needed. Overdosage: If you think you have taken too much of this medicine contact a poison control center or emergency room at once. NOTE: This medicine is only for you. Do not share this medicine with others. What if I miss a dose? It is important not to miss your dose. Call your doctor or health care professional if you are unable to keep an appointment. What may interact with this medicine? Interactions are not expected. This list may not describe all possible interactions. Give your health care provider a list of all the medicines, herbs, non-prescription drugs, or dietary supplements you use. Also tell them if you smoke, drink alcohol, or use illegal drugs. Some items may interact with your medicine. What should I watch for while using this  medicine? Your condition will be monitored carefully while you are receiving this medicine. Visit your prescriber or health care professional for regular checks on your progress and for the needed blood tests. It is important to keep all appointments. What side effects may I notice from receiving this medicine? Side effects that you should report to your doctor or health care professional as soon as possible: -allergic reactions like skin rash, itching or hives, swelling of the face, lips, or tongue -shortness of breath, chest pain, swelling in a leg -unusual bleeding or bruising Side effects that usually do not require medical attention (report to your doctor or health care professional if they continue or are bothersome): -dizziness -headache -muscle aches -pain in arms and legs -stomach pain -trouble sleeping This list may not describe all possible side effects. Call your doctor for medical advice about side effects. You may report side effects to FDA at 1-800-FDA-1088. Where should I keep my medicine? This drug is given in a hospital or clinic and will not be stored at home. NOTE: This sheet is a summary. It may not cover all possible information. If you have questions about this medicine, talk to your doctor, pharmacist, or health care provider.  2015, Elsevier/Gold Standard. (2008-03-07 15:13:04)  

## 2014-03-10 ENCOUNTER — Ambulatory Visit: Payer: Medicare Other | Admitting: Family

## 2014-03-10 ENCOUNTER — Other Ambulatory Visit: Payer: Medicare Other | Admitting: Lab

## 2014-03-14 ENCOUNTER — Other Ambulatory Visit: Payer: Self-pay | Admitting: Neurosurgery

## 2014-03-14 DIAGNOSIS — I6200 Nontraumatic subdural hemorrhage, unspecified: Secondary | ICD-10-CM

## 2014-03-18 ENCOUNTER — Telehealth: Payer: Self-pay | Admitting: Hematology & Oncology

## 2014-03-18 NOTE — Telephone Encounter (Signed)
Niece made 9-3 from missed 8-20

## 2014-03-22 ENCOUNTER — Ambulatory Visit
Admission: RE | Admit: 2014-03-22 | Discharge: 2014-03-22 | Disposition: A | Discharge status: Home / Self Care | Payer: Medicare Other | Source: Ambulatory Visit | Attending: Neurosurgery | Admitting: Neurosurgery

## 2014-03-22 DIAGNOSIS — I6200 Nontraumatic subdural hemorrhage, unspecified: Secondary | ICD-10-CM

## 2014-03-24 ENCOUNTER — Ambulatory Visit (HOSPITAL_BASED_OUTPATIENT_CLINIC_OR_DEPARTMENT_OTHER): Payer: Medicare Other | Admitting: Family

## 2014-03-24 ENCOUNTER — Other Ambulatory Visit (HOSPITAL_BASED_OUTPATIENT_CLINIC_OR_DEPARTMENT_OTHER): Payer: Medicare Other | Admitting: Lab

## 2014-03-24 ENCOUNTER — Ambulatory Visit (HOSPITAL_BASED_OUTPATIENT_CLINIC_OR_DEPARTMENT_OTHER): Payer: Medicare Other

## 2014-03-24 ENCOUNTER — Encounter: Payer: Self-pay | Admitting: Family

## 2014-03-24 VITALS — BP 174/67 | HR 60 | Temp 97.2°F | Resp 14 | Ht <= 58 in | Wt 123.0 lb

## 2014-03-24 DIAGNOSIS — D696 Thrombocytopenia, unspecified: Secondary | ICD-10-CM

## 2014-03-24 DIAGNOSIS — D693 Immune thrombocytopenic purpura: Secondary | ICD-10-CM

## 2014-03-24 LAB — CBC WITH DIFFERENTIAL (CANCER CENTER ONLY)
BASO#: 0 10*3/uL (ref 0.0–0.2)
BASO%: 0.5 % (ref 0.0–2.0)
EOS%: 7.3 % — AB (ref 0.0–7.0)
Eosinophils Absolute: 0.4 10*3/uL (ref 0.0–0.5)
HEMATOCRIT: 34.7 % — AB (ref 34.8–46.6)
HGB: 12.1 g/dL (ref 11.6–15.9)
LYMPH#: 1 10*3/uL (ref 0.9–3.3)
LYMPH%: 17.6 % (ref 14.0–48.0)
MCH: 34.3 pg — ABNORMAL HIGH (ref 26.0–34.0)
MCHC: 34.9 g/dL (ref 32.0–36.0)
MCV: 98 fL (ref 81–101)
MONO#: 0.5 10*3/uL (ref 0.1–0.9)
MONO%: 8.8 % (ref 0.0–13.0)
NEUT%: 65.8 % (ref 39.6–80.0)
NEUTROS ABS: 3.8 10*3/uL (ref 1.5–6.5)
PLATELETS: 50 10*3/uL — AB (ref 145–400)
RBC: 3.53 10*6/uL — ABNORMAL LOW (ref 3.70–5.32)
RDW: 12.5 % (ref 11.1–15.7)
WBC: 5.8 10*3/uL (ref 3.9–10.0)

## 2014-03-24 MED ORDER — ROMIPLOSTIM 250 MCG ~~LOC~~ SOLR
2.0000 ug/kg | Freq: Once | SUBCUTANEOUS | Status: AC
  Administered 2014-03-24: 110 ug via SUBCUTANEOUS
  Filled 2014-03-24: qty 0.22

## 2014-03-24 MED ORDER — ROMIPLOSTIM 250 MCG ~~LOC~~ SOLR: 1.0000 ug/kg | Freq: Once | SUBCUTANEOUS | Status: DC

## 2014-03-24 NOTE — Progress Notes (Signed)
[place]  [phone] [phone]) [phone]  ID: Lisa James OB: [birth year] MR#: [id number] XEN#:[id number] Patient Care Team: Darlyne Russian, MD as PCP - General (Family Medicine)  DIAGNOSIS: Thrombocytopenia-likely immune thrombocytopenia  INTERVAL HISTORY: Lisa James is here today for a  followup. We have increased the frequency of her Nplate in order to keep her platelet count up. Today her platelets are down at 50. She is still feeling good. She has had not problems. She last got Nplate in August. Her appetite is good and she is drinking plenty of fluids. She has had no bleeding. She denies fever, chills, cough, rash, headache, dizziness, SOB, chest pain, palpitations, abdominal pain, constipation, diarrhea, blood in urine or stool. She has had no bone pain. She denies swelling, tenderness, numbness or tingling in her extremities. 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 They have a lot of activities that she participates in and she also walks every day. She really enjoys herself there.  CURRENT TREATMENT: Nplate as indicated  REVIEW OF SYSTEMS: All other 10 point review of systems is negative except for those issues mentioned above.   PAST MEDICAL HISTORY: Past Medical History  Diagnosis Date  . Acute MI, lateral wall   . Chronic systolic heart failure   . Ischemic heart disease   . Mitral regurgitation   . HTN (hypertension)   . DM (diabetes mellitus)   . Non Hodgkin's lymphoma     of the throat  . Immune thrombocytopenic purpura 01/21/2013   PAST SURGICAL HISTORY: Past Surgical History  Procedure Laterality Date  . Coronary artery bypass graft  11/20/2007    x5  . Bone marrow biopsy  11/22/2002    left posterior iliac crest bone marrow biopsy and aspirate  . Submental lymph node excisional biopsy  10/22/2002   FAMILY HISTORY Family History  Problem Relation Age of Onset  . Heart attack Father   . Diabetes Father    GYNECOLOGIC HISTORY:  No  LMP recorded. Patient is postmenopausal.   SOCIAL HISTORY:  History   Social History  . Marital Status: Widowed    Spouse Name: N/A    Number of Children: N/A  . Years of Education: N/A   Occupational History  . Not on file.   Social History Main Topics  . Smoking status: Never Smoker   . Smokeless tobacco: Never Used     Comment: never used tobacco.  . Alcohol Use: No  . Drug Use: No  . Sexual Activity: No   Other Topics Concern  . Not on file   Social History Narrative  . No narrative on file   ADVANCED DIRECTIVES: <no information>  HEALTH MAINTENANCE: History  Substance Use Topics  . Smoking status: Never Smoker   . Smokeless tobacco: Never Used     Comment: never used tobacco.  . Alcohol Use: No   Colonoscopy: PAP: Bone density: Lipid panel:  Allergies  Allergen Reactions  . Ace Inhibitors Cough   Current Outpatient Prescriptions  Medication Sig Dispense Refill  . atorvastatin (LIPITOR) 20 MG tablet TAKE 1 TABLET (20 MG TOTAL) BY MOUTH DAILY.  30 tablet  9  . calcium carbonate (TUMS - DOSED IN MG ELEMENTAL CALCIUM) 500 MG chewable tablet Chew 1 tablet by mouth daily.      . Calcium Carbonate-Vitamin D (CALCIUM-VITAMIN D) 500-200 MG-UNIT per tablet Take 1 tablet by mouth every morning. CALCIUM 500 MG + VITAMIN D 3 1000 UNITS.      Marland Kitchen  carvedilol (COREG) 6.25 MG tablet TAKE 1 TABLET (6.25 MG TOTAL) BY MOUTH 2 (TWO) TIMES DAILY WITH A MEAL.  60 tablet  9  . Cholecalciferol (VITAMIN D) 1000 UNITS capsule Take 1,000 Units by mouth daily.      . furosemide (LASIX) 20 MG tablet TAKE 1 TABLET (20 MG TOTAL) BY MOUTH DAILY.  30 tablet  6  . irbesartan (AVAPRO) 300 MG tablet Take 1 tablet (300 mg total) by mouth daily.  30 tablet  4  . KLOR-CON M20 20 MEQ tablet TAKE 1 TABLET (20 MEQ TOTAL) BY MOUTH DAILY.  30 tablet  4  . mupirocin ointment (BACTROBAN) 2 % Apply topically 3 (three) times daily.  22 g  0  . mupirocin ointment (BACTROBAN) 2 % Apply to left ear lobe 3  times a day  22 g  0  . romiPLOStim (NPLATE) 250 MCG injection Inject 1 mcg/kg into the skin as needed.       . sertraline (ZOLOFT) 25 MG tablet TAKE 1 TABLET (25 MG TOTAL) BY MOUTH DAILY.  30 tablet  4   No current facility-administered medications for this visit.   Facility-Administered Medications Ordered in Other Visits  Medication Dose Route Frequency Provider Last Rate Last Dose  . diphenhydrAMINE (BENADRYL) tablet 25 mg  25 mg Oral Q6H PRN Peter R Ennever, MD   25 mg at 01/28/13 0913   OBJECTIVE: Filed Vitals:   03/24/14 1208  BP: 174/67  Pulse: 60  Temp: 97.2 F (36.2 C)  Resp: 14   Body mass index is 25.71 kg/(m^2). ECOG FS:0 - Asymptomatic Ocular: Sclerae unicteric, pupils equal, round and reactive to light Ear-nose-throat: Oropharynx clear, dentition fair Lymphatic: No cervical or supraclavicular adenopathy Lungs no rales or rhonchi, good excursion bilaterally Heart regular rate and rhythm, no murmur appreciated Abd soft, nontender, positive bowel sounds MSK no focal spinal tenderness, no joint edema Neuro: non-focal, well-oriented, appropriate affect Breasts: Deferred  LAB RESULTS: CMP     Component Value Date/Time   NA 138 10/28/2013 1335   NA 142 01/21/2013 1157   K 4.7 10/28/2013 1335   K 5.3* 01/21/2013 1157   CL 102 10/28/2013 1335   CL 103 01/21/2013 1157   CO2 25 10/28/2013 1335   CO2 27 01/21/2013 1157   GLUCOSE 135* 10/28/2013 1335   GLUCOSE 123* 01/21/2013 1157   BUN 27* 10/28/2013 1335   BUN 32* 01/21/2013 1157   CREATININE 1.32* 10/28/2013 1335   CREATININE 1.54* 11/22/2012 1022   CALCIUM 9.5 10/28/2013 1335   CALCIUM 9.8 01/21/2013 1157   PROT 6.3 10/28/2013 1335   PROT 6.4 01/21/2013 1157   ALBUMIN 4.3 10/28/2013 1335   AST 17 10/28/2013 1335   AST 24 01/21/2013 1157   ALT 12 10/28/2013 1335   ALT 18 01/21/2013 1157   ALKPHOS 93 10/28/2013 1335   ALKPHOS 67 01/21/2013 1157   BILITOT 1.3* 10/28/2013 1335   BILITOT 1.10 01/21/2013 1157   GFRNONAA 30* 11/22/2012 1022   GFRAA 35* 11/22/2012  1022   No results found for this basename: SPEP, UPEP,  kappa and lambda light chains   Lab Results  Component Value Date   WBC 5.8 03/24/2014   NEUTROABS 3.8 03/24/2014   HGB 12.1 03/24/2014   HCT 34.7* 03/24/2014   MCV 98 03/24/2014   PLT 50* 03/24/2014   No results found for this basename: LABCA2   No components found with this basename: LABCA125   No results found for this basename: INR,    in the last 168 hours  STUDIES: Ct Head Wo Contrast  03/22/2014   CLINICAL DATA:  Follow-up subdural hematoma. Fall May 2015. History of non Hodgkin's lymphoma.  EXAM: CT HEAD WITHOUT CONTRAST  TECHNIQUE: Contiguous axial images were obtained from the base of the skull through the vertex without intravenous contrast.  COMPARISON:  Several prior exams most recent 12/14/2013.  FINDINGS: Large left convexity extra-axial complex collection with maximal thickness of 2.9 cm causing mass effect upon the left lateral ventricle with 6.9 mm of midline shift to the right appears similar to prior exam consistent with chronic subdural hematoma.  Very small chronic right-sided subdural collection/dural thickening stable.  No new intracranial hemorrhage is detected.  Tumor as cause for the left extra-axial collection felt unlikely given the patient's history and imaging findings.  No CT evidence of large acute infarct.  Baseline atrophy without hydrocephalus.  Vascular calcifications fairly significant in degree.  No skull fracture or skull lesion noted.  IMPRESSION: Large left convexity extra-axial complex collection with maximal thickness of 2.9 cm causing mass effect upon the left lateral ventricle with 6.9 mm of midline shift to the right appears similar to prior exam consistent with chronic subdural hematoma.  Very small chronic right-sided subdural collection/dural thickening stable.  No new intracranial hemorrhage is detected.   Electronically Signed   By: Chauncey Cruel M.D.   On: 03/22/2014 11:31   ASSESSMENT/PLAN: Lisa James  is an 80 year old female with thrombocytopenia. This is likely immune thrombocytopenia. She had a bone marrow biopsy last year.  Today her platelets are 50. We will give her Nplate today.  We will see her in a month for labs and follow-up. She is in agreement and knows to call with any questions or concerns. We can certainly see her sooner if need be.   Eliezer Bottom, NP 03/24/2014 12:17 PM

## 2014-03-24 NOTE — Patient Instructions (Signed)
Romiplostim injection What is this medicine? ROMIPLOSTIM (roe mi PLOE stim) helps your body make more platelets. This medicine is used to treat low platelets caused by chronic idiopathic thrombocytopenic purpura (ITP). This medicine may be used for other purposes; ask your health care provider or pharmacist if you have questions. COMMON BRAND NAME(S): Nplate What should I tell my health care provider before I take this medicine? They need to know if you have any of these conditions: -cancer or myelodysplastic syndrome -low blood counts, like low white cell, platelet, or red cell counts -take medicines that treat or prevent blood clots -an unusual or allergic reaction to romiplostim, mannitol, other medicines, foods, dyes, or preservatives -pregnant or trying to get pregnant -breast-feeding How should I use this medicine? This medicine is for injection under the skin. It is given by a health care professional in a hospital or clinic setting. A special MedGuide will be given to you before your injection. Read this information carefully each time. Talk to your pediatrician regarding the use of this medicine in children. Special care may be needed. Overdosage: If you think you have taken too much of this medicine contact a poison control center or emergency room at once. NOTE: This medicine is only for you. Do not share this medicine with others. What if I miss a dose? It is important not to miss your dose. Call your doctor or health care professional if you are unable to keep an appointment. What may interact with this medicine? Interactions are not expected. This list may not describe all possible interactions. Give your health care provider a list of all the medicines, herbs, non-prescription drugs, or dietary supplements you use. Also tell them if you smoke, drink alcohol, or use illegal drugs. Some items may interact with your medicine. What should I watch for while using this  medicine? Your condition will be monitored carefully while you are receiving this medicine. Visit your prescriber or health care professional for regular checks on your progress and for the needed blood tests. It is important to keep all appointments. What side effects may I notice from receiving this medicine? Side effects that you should report to your doctor or health care professional as soon as possible: -allergic reactions like skin rash, itching or hives, swelling of the face, lips, or tongue -shortness of breath, chest pain, swelling in a leg -unusual bleeding or bruising Side effects that usually do not require medical attention (report to your doctor or health care professional if they continue or are bothersome): -dizziness -headache -muscle aches -pain in arms and legs -stomach pain -trouble sleeping This list may not describe all possible side effects. Call your doctor for medical advice about side effects. You may report side effects to FDA at 1-800-FDA-1088. Where should I keep my medicine? This drug is given in a hospital or clinic and will not be stored at home. NOTE: This sheet is a summary. It may not cover all possible information. If you have questions about this medicine, talk to your doctor, pharmacist, or health care provider.  2015, Elsevier/Gold Standard. (2008-03-07 15:13:04)  

## 2014-03-25 ENCOUNTER — Telehealth: Payer: Self-pay | Admitting: Hematology & Oncology

## 2014-03-25 NOTE — Telephone Encounter (Signed)
Niece moved 9-30 to 10-6 said pt could only come on tues and thurs.. I left RN voice mail

## 2014-03-26 ENCOUNTER — Ambulatory Visit (INDEPENDENT_AMBULATORY_CARE_PROVIDER_SITE_OTHER): Payer: Medicare Other | Admitting: Emergency Medicine

## 2014-03-26 ENCOUNTER — Other Ambulatory Visit: Payer: Self-pay | Admitting: Emergency Medicine

## 2014-03-26 VITALS — BP 118/68 | HR 73 | Temp 97.2°F | Resp 16 | Ht <= 58 in | Wt 123.0 lb

## 2014-03-26 DIAGNOSIS — E119 Type 2 diabetes mellitus without complications: Secondary | ICD-10-CM

## 2014-03-26 DIAGNOSIS — S065XAA Traumatic subdural hemorrhage with loss of consciousness status unknown, initial encounter: Secondary | ICD-10-CM

## 2014-03-26 DIAGNOSIS — I6200 Nontraumatic subdural hemorrhage, unspecified: Secondary | ICD-10-CM

## 2014-03-26 DIAGNOSIS — Z23 Encounter for immunization: Secondary | ICD-10-CM

## 2014-03-26 DIAGNOSIS — R011 Cardiac murmur, unspecified: Secondary | ICD-10-CM

## 2014-03-26 DIAGNOSIS — S065X9A Traumatic subdural hemorrhage with loss of consciousness of unspecified duration, initial encounter: Secondary | ICD-10-CM

## 2014-03-26 LAB — GLUCOSE, POCT (MANUAL RESULT ENTRY): POC GLUCOSE: 262 mg/dL — AB (ref 70–99)

## 2014-03-26 LAB — COMPREHENSIVE METABOLIC PANEL
ALK PHOS: 95 U/L (ref 39–117)
ALT: 13 U/L (ref 0–35)
AST: 18 U/L (ref 0–37)
Albumin: 4.4 g/dL (ref 3.5–5.2)
BILIRUBIN TOTAL: 1.8 mg/dL — AB (ref 0.2–1.2)
BUN: 25 mg/dL — ABNORMAL HIGH (ref 6–23)
CO2: 26 mEq/L (ref 19–32)
Calcium: 9.3 mg/dL (ref 8.4–10.5)
Chloride: 102 mEq/L (ref 96–112)
Creat: 1.35 mg/dL — ABNORMAL HIGH (ref 0.50–1.10)
Glucose, Bld: 263 mg/dL — ABNORMAL HIGH (ref 70–99)
Potassium: 3.9 mEq/L (ref 3.5–5.3)
SODIUM: 136 meq/L (ref 135–145)
Total Protein: 6.2 g/dL (ref 6.0–8.3)

## 2014-03-26 LAB — POCT GLYCOSYLATED HEMOGLOBIN (HGB A1C): HEMOGLOBIN A1C: 7

## 2014-03-26 NOTE — Progress Notes (Addendum)
Subjective:    Patient ID: Lisa James, female    DOB: 07/20/1928, 80 y.o.   MRN: [id number]  This chart was scribed for Arlyss Queen, MD by Erling Conte, Medical Scribe. This patient was seen in Room 9 and the patient's care was started at 9:30 AM.  Chief Complaint  Patient presents with  . Diabetes    Diabetic Check up    HPI HPI Comments: Lisa James is a 80 y.o. female who presents to the [facility] for a check up of her DM. Pt also has a h/o chronic subdural hematoma and she just had a CT of her head. She sees Dr. Sherwood Gambler for management of her chronic subdural hematoma. The results of the CT show no new findings. She is still getting her platelet infusions by her oncologist for her autoimmune thrombocytopenic purpura. Pt states she has been trying to watch her sugar intake. She does admit she has been drinking diet sodas. She closely monitors her blood sugar. She follows up with a cardiologist for her heart murmur. Per daughter the pt is living in a retirement home, [facility]. She has not had her flu shot or pneumonia shot this year. Pt has no complaints at this time.    Patient Active Problem List   Diagnosis Date Noted  . ITP (idiopathic thrombocytopenic purpura) 07/06/2013  . Aortic valve stenosis 06/19/2013  . Immune thrombocytopenic purpura 01/21/2013  . GERD (gastroesophageal reflux disease) 12/17/2012  . Type II or unspecified type diabetes mellitus with renal manifestations, not stated as uncontrolled(250.40) 12/10/2012  . Chronic kidney disease (CKD), stage III (moderate) 11/22/2012  . Diabetes mellitus 07/03/2011  . Depression 07/03/2011  . Non-Hodgkin's lymphoma 07/03/2011  . CAD (coronary artery disease) 07/03/2011  . PAD (peripheral artery disease) 07/03/2011  . Vitamin d deficiency 07/03/2011  . Subdural hematoma 07/03/2011  . Confusion 05/12/2011  . Subdural hematoma, acute 05/12/2011  . HYPERLIPIDEMIA TYPE IIB / III  05/10/2009  . OLD MYOCARDIAL INFARCTION 05/10/2009  . Occlusion and stenosis of carotid artery without mention of cerebral infarction 05/10/2009  . HYPERTENSION, BENIGN 11/03/2008  . MITRAL REGURGITATION 10/29/2008  . ISCHEMIC HEART DISEASE 10/29/2008  . SYSTOLIC HEART FAILURE, CHRONIC 10/29/2008   Past Medical History  Diagnosis Date  . Acute MI, lateral wall   . Chronic systolic heart failure   . Ischemic heart disease   . Mitral regurgitation   . HTN (hypertension)   . DM (diabetes mellitus)   . Non Hodgkin's lymphoma     of the throat  . Immune thrombocytopenic purpura 01/21/2013   Past Surgical History  Procedure Laterality Date  . Coronary artery bypass graft  11/20/2007    x5  . Bone marrow biopsy  11/22/2002    left posterior iliac crest bone marrow biopsy and aspirate  . Submental lymph node excisional biopsy  10/22/2002   Allergies  Allergen Reactions  . Ace Inhibitors Cough   Prior to Admission medications   Medication Sig Start Date End Date Taking? Authorizing Provider  atorvastatin (LIPITOR) 20 MG tablet TAKE 1 TABLET (20 MG TOTAL) BY MOUTH DAILY. 01/27/14  Yes Mancel Bale, PA-C  calcium carbonate (TUMS - DOSED IN MG ELEMENTAL CALCIUM) 500 MG chewable tablet Chew 1 tablet by mouth daily.   Yes Historical Provider, MD  Calcium Carbonate-Vitamin D (CALCIUM-VITAMIN D) 500-200 MG-UNIT per tablet Take 1 tablet by mouth every morning. CALCIUM 500 MG + VITAMIN D 3 1000 UNITS.   Yes Historical  Provider, MD  carvedilol (COREG) 6.25 MG tablet TAKE 1 TABLET (6.25 MG TOTAL) BY MOUTH 2 (TWO) TIMES DAILY WITH A MEAL. 01/27/14  Yes Sarah L Weber, PA-C  Cholecalciferol (VITAMIN D) 1000 UNITS capsule Take 1,000 Units by mouth daily.   Yes Historical Provider, MD  furosemide (LASIX) 20 MG tablet TAKE 1 TABLET (20 MG TOTAL) BY MOUTH DAILY. 01/27/14  Yes Sarah L Weber, PA-C  irbesartan (AVAPRO) 300 MG tablet Take 1 tablet (300 mg total) by mouth daily. 12/21/13  Yes Thomas A Kelly, MD  KLOR-CON  M20 20 MEQ tablet TAKE 1 TABLET (20 MEQ TOTAL) BY MOUTH DAILY. 01/27/14  Yes Sarah L Weber, PA-C  mupirocin ointment (BACTROBAN) 2 % Apply topically 3 (three) times daily. 04/03/13  Yes  A , MD  mupirocin ointment (BACTROBAN) 2 % Apply to left ear lobe 3 times a day 02/03/14  Yes Peter R Ennever, MD  romiPLOStim (NPLATE) 250 MCG injection Inject 1 mcg/kg into the skin as needed.    Yes Historical Provider, MD  sertraline (ZOLOFT) 25 MG tablet TAKE 1 TABLET (25 MG TOTAL) BY MOUTH DAILY.   Yes  A , MD   History   Social History  . Marital Status: Widowed    Spouse Name: N/A    Number of Children: N/A  . Years of Education: N/A   Occupational History  . Not on file.   Social History Main Topics  . Smoking status: Never Smoker   . Smokeless tobacco: Never Used     Comment: never used tobacco.  . Alcohol Use: No  . Drug Use: No  . Sexual Activity: No   Other Topics Concern  . Not on file   Social History Narrative  . No narrative on file     Review of Systems  Constitutional: Negative for fatigue.  Gastrointestinal: Negative for nausea and vomiting.  Endocrine: Negative for polydipsia.  Genitourinary: Negative for frequency.  Psychiatric/Behavioral: Negative for confusion.       Objective:   Physical Exam CONSTITUTIONAL: Well developed/well nourished HEAD: Normocephalic/atraumatic EYES: EOMI/PERRL ENMT: Mucous membranes moist NECK: supple no meningeal signs SPINE:entire spine nontender CV: S1/S2 noted, no rubs/gallops noted. Harsh 3/6 systolic murmur at left sternal border. LUNGS: Lungs are clear to auscultation bilaterally, no apparent distress ABDOMEN: soft, nontender, no rebound or guarding GU:no cva tenderness NEURO: Pt is awake/alert, moves all extremitiesx4 EXTREMITIES: pulses normal, full ROM SKIN: warm, color normal PSYCH: no abnormalities of mood noted Results for orders placed in visit on 03/26/14  GLUCOSE, POCT (MANUAL RESULT ENTRY)       Result Value Ref Range   POC Glucose 262 (*) 70 - 99 mg/dl  POCT GLYCOSYLATED HEMOGLOBIN (HGB A1C)      Result Value Ref Range   Hemoglobin A1C 7.0          Assessment & Plan:  Hemoglobin A1c is at 7. Considering her other medical problems I think this is very acceptable. Repeat renal function tests were done in that she has had some moderate renal dysfunction in the past. She was immunized with flu vaccine and Prevnar. Her subdural appears stable by CT. She is due to see Dr. Nudelman on Tuesday 

## 2014-03-27 LAB — BILIRUBIN, FRACTIONATED(TOT/DIR/INDIR)
BILIRUBIN INDIRECT: 1.4 mg/dL — AB (ref 0.2–1.2)
Bilirubin, Direct: 0.4 mg/dL — ABNORMAL HIGH (ref 0.0–0.3)
Total Bilirubin: 1.8 mg/dL — ABNORMAL HIGH (ref 0.2–1.2)

## 2014-04-20 ENCOUNTER — Other Ambulatory Visit: Payer: Medicare Other | Admitting: Lab

## 2014-04-20 ENCOUNTER — Ambulatory Visit: Payer: Medicare Other | Admitting: Hematology & Oncology

## 2014-04-26 ENCOUNTER — Ambulatory Visit (HOSPITAL_BASED_OUTPATIENT_CLINIC_OR_DEPARTMENT_OTHER): Payer: Medicare Other | Admitting: Hematology & Oncology

## 2014-04-26 ENCOUNTER — Other Ambulatory Visit (HOSPITAL_BASED_OUTPATIENT_CLINIC_OR_DEPARTMENT_OTHER): Payer: Medicare Other | Admitting: Lab

## 2014-04-26 ENCOUNTER — Encounter: Payer: Self-pay | Admitting: Hematology & Oncology

## 2014-04-26 ENCOUNTER — Ambulatory Visit (HOSPITAL_BASED_OUTPATIENT_CLINIC_OR_DEPARTMENT_OTHER): Payer: Medicare Other

## 2014-04-26 VITALS — BP 150/53 | HR 56 | Temp 97.7°F | Resp 14 | Ht 59.0 in | Wt 122.0 lb

## 2014-04-26 DIAGNOSIS — D693 Immune thrombocytopenic purpura: Secondary | ICD-10-CM

## 2014-04-26 DIAGNOSIS — D696 Thrombocytopenia, unspecified: Secondary | ICD-10-CM

## 2014-04-26 LAB — CBC WITH DIFFERENTIAL (CANCER CENTER ONLY)
BASO#: 0 10*3/uL (ref 0.0–0.2)
BASO%: 0.5 % (ref 0.0–2.0)
EOS%: 8.2 % — AB (ref 0.0–7.0)
Eosinophils Absolute: 0.5 10*3/uL (ref 0.0–0.5)
HCT: 35.2 % (ref 34.8–46.6)
HGB: 12.5 g/dL (ref 11.6–15.9)
LYMPH#: 1 10*3/uL (ref 0.9–3.3)
LYMPH%: 16.9 % (ref 14.0–48.0)
MCH: 34.6 pg — ABNORMAL HIGH (ref 26.0–34.0)
MCHC: 35.5 g/dL (ref 32.0–36.0)
MCV: 98 fL (ref 81–101)
MONO#: 0.4 10*3/uL (ref 0.1–0.9)
MONO%: 7 % (ref 0.0–13.0)
NEUT%: 67.4 % (ref 39.6–80.0)
NEUTROS ABS: 4.1 10*3/uL (ref 1.5–6.5)
Platelets: 46 10*3/uL — ABNORMAL LOW (ref 145–400)
RBC: 3.61 10*6/uL — AB (ref 3.70–5.32)
RDW: 12.4 % (ref 11.1–15.7)
WBC: 6.1 10*3/uL (ref 3.9–10.0)

## 2014-04-26 MED ORDER — ROMIPLOSTIM 250 MCG ~~LOC~~ SOLR
4.0000 ug/kg | Freq: Once | SUBCUTANEOUS | Status: AC
  Administered 2014-04-26: 215 ug via SUBCUTANEOUS
  Filled 2014-04-26: qty 0.43

## 2014-04-26 NOTE — Progress Notes (Signed)
Hematology and Oncology Follow Up Visit  MYDA DETWILER [id number] [birth year] 80 y.o. 04/26/2014   Principle Diagnosis:   Thrombocytopenia-likely immune thrombocytopenia  Current Therapy:    Nplate as indicated     Interim History:  Ms.  Dooner is back for followup. She still does not gain and place but once a month. I'm not sure why she did not come in sooner. I will not make that she comes in every couple weeks now.  She's had no bleeding. She has, looks to be, a skin cancer on the left earlobe. There is no bleeding from this.  Her appetite is doing good. She's had no nausea or vomiting. There's been no leg swelling. She's had no joint issues.  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 She really enjoys herself there.  Medications: Current outpatient prescriptions:atorvastatin (LIPITOR) 20 MG tablet, TAKE 1 TABLET (20 MG TOTAL) BY MOUTH DAILY., Disp: 30 tablet, Rfl: 9;  calcium carbonate (TUMS - DOSED IN MG ELEMENTAL CALCIUM) 500 MG chewable tablet, Chew 1 tablet by mouth daily., Disp: , Rfl: ;  Calcium Carbonate-Vitamin D (CALCIUM-VITAMIN D) 500-200 MG-UNIT per tablet, Take 1 tablet by mouth every morning. CALCIUM 500 MG + VITAMIN D 3 1000 UNITS., Disp: , Rfl:  carvedilol (COREG) 6.25 MG tablet, TAKE 1 TABLET (6.25 MG TOTAL) BY MOUTH 2 (TWO) TIMES DAILY WITH A MEAL., Disp: 60 tablet, Rfl: 9;  Cholecalciferol (VITAMIN D) 1000 UNITS capsule, Take 1,000 Units by mouth daily., Disp: , Rfl: ;  furosemide (LASIX) 20 MG tablet, TAKE 1 TABLET (20 MG TOTAL) BY MOUTH DAILY., Disp: 30 tablet, Rfl: 6;  irbesartan (AVAPRO) 300 MG tablet, Take 1 tablet (300 mg total) by mouth daily., Disp: 30 tablet, Rfl: 4 KLOR-CON M20 20 MEQ tablet, TAKE 1 TABLET (20 MEQ TOTAL) BY MOUTH DAILY., Disp: 30 tablet, Rfl: 4;  mupirocin ointment (BACTROBAN) 2 %, Apply topically 3 (three) times daily., Disp: 22 g, Rfl: 0;  mupirocin ointment (BACTROBAN) 2 %, Apply to left ear lobe 3 times a day, Disp: 22 g, Rfl: 0;   romiPLOStim (NPLATE) 250 MCG injection, Inject 1 mcg/kg into the skin as needed. , Disp: , Rfl:  sertraline (ZOLOFT) 25 MG tablet, TAKE 1 TABLET (25 MG TOTAL) BY MOUTH DAILY., Disp: 30 tablet, Rfl: 4 No current facility-administered medications for this visit. Facility-Administered Medications Ordered in Other Visits: diphenhydrAMINE (BENADRYL) tablet 25 mg, 25 mg, Oral, Q6H PRN, Volanda Napoleon, MD, 25 mg at 01/28/13 0913  Allergies:  Allergies  Allergen Reactions  . Ace Inhibitors Cough    Past Medical History, Surgical history, Social history, and Family History were reviewed and updated.  Review of Systems: As above  Physical Exam:  height is 4\' 11"  (1.499 m) and weight is 122 lb (55.339 kg). Her oral temperature is 97.7 F (36.5 C). Her blood pressure is 150/53 and her pulse is 56. Her respiration is 14.   Totally, petite white female in no obvious distress. Head and neck exam shows no ocular or oral lesions. Pupils reacted properly. Extraocular muscles are intact. There is no adenopathy in the neck. Lungs are clear. Cardiac exam regular in rhythm. She is a 1/6 systolic ejection murmur. Abdomen is soft. She has good bowel sounds. There is no fluid wave. There is no palpable liver or spleen tip. Back exam shows no tenderness over the spine. She has some slight kyphosis. Extremities shows no clubbing cyanosis or edema. His age related osteoarthritic changes. She has good strength bilaterally.  Neurological exam is without any deficits.  Lab Results  Component Value Date   WBC 6.1 04/26/2014   HGB 12.5 04/26/2014   HCT 35.2 04/26/2014   MCV 98 04/26/2014   PLT 46* 04/26/2014     Chemistry      Component Value Date/Time   NA 136 03/26/2014 0956   NA 142 01/21/2013 1157   K 3.9 03/26/2014 0956   K 5.3* 01/21/2013 1157   CL 102 03/26/2014 0956   CL 103 01/21/2013 1157   CO2 26 03/26/2014 0956   CO2 27 01/21/2013 1157   BUN 25* 03/26/2014 0956   BUN 32* 01/21/2013 1157   CREATININE 1.35* 03/26/2014 0956    CREATININE 1.54* 11/22/2012 1022      Component Value Date/Time   CALCIUM 9.3 03/26/2014 0956   CALCIUM 9.8 01/21/2013 1157   ALKPHOS 95 03/26/2014 0956   ALKPHOS 67 01/21/2013 1157   AST 18 03/26/2014 0956   AST 24 01/21/2013 1157   ALT 13 03/26/2014 0956   ALT 18 01/21/2013 1157   BILITOT 1.8* 03/26/2014 0956   BILITOT 1.8* 03/26/2014 0956   BILITOT 1.10 01/21/2013 1157         Impression and Plan: Ms. Villamar is a 80 year old female with thrombocytopenia. This is likely immune thrombocytopenia. I actually did do a bone marrow on her last year.  Her platelet count is back down. Again, we have to get her in every couple weeks. This will have a better chance at keeping her platelet count up. I will make sure that we keep her platelet count up for the holidays. This way, she will be more active.  We will have her come back in 2 weeks.     Volanda Napoleon, MD 10/6/201512:24 PM

## 2014-04-26 NOTE — Patient Instructions (Signed)
Romiplostim injection What is this medicine? ROMIPLOSTIM (roe mi PLOE stim) helps your body make more platelets. This medicine is used to treat low platelets caused by chronic idiopathic thrombocytopenic purpura (ITP). This medicine may be used for other purposes; ask your health care provider or pharmacist if you have questions. COMMON BRAND NAME(S): Nplate What should I tell my health care provider before I take this medicine? They need to know if you have any of these conditions: -cancer or myelodysplastic syndrome -low blood counts, like low white cell, platelet, or red cell counts -take medicines that treat or prevent blood clots -an unusual or allergic reaction to romiplostim, mannitol, other medicines, foods, dyes, or preservatives -pregnant or trying to get pregnant -breast-feeding How should I use this medicine? This medicine is for injection under the skin. It is given by a health care professional in a hospital or clinic setting. A special MedGuide will be given to you before your injection. Read this information carefully each time. Talk to your pediatrician regarding the use of this medicine in children. Special care may be needed. Overdosage: If you think you have taken too much of this medicine contact a poison control center or emergency room at once. NOTE: This medicine is only for you. Do not share this medicine with others. What if I miss a dose? It is important not to miss your dose. Call your doctor or health care professional if you are unable to keep an appointment. What may interact with this medicine? Interactions are not expected. This list may not describe all possible interactions. Give your health care provider a list of all the medicines, herbs, non-prescription drugs, or dietary supplements you use. Also tell them if you smoke, drink alcohol, or use illegal drugs. Some items may interact with your medicine. What should I watch for while using this  medicine? Your condition will be monitored carefully while you are receiving this medicine. Visit your prescriber or health care professional for regular checks on your progress and for the needed blood tests. It is important to keep all appointments. What side effects may I notice from receiving this medicine? Side effects that you should report to your doctor or health care professional as soon as possible: -allergic reactions like skin rash, itching or hives, swelling of the face, lips, or tongue -shortness of breath, chest pain, swelling in a leg -unusual bleeding or bruising Side effects that usually do not require medical attention (report to your doctor or health care professional if they continue or are bothersome): -dizziness -headache -muscle aches -pain in arms and legs -stomach pain -trouble sleeping This list may not describe all possible side effects. Call your doctor for medical advice about side effects. You may report side effects to FDA at 1-800-FDA-1088. Where should I keep my medicine? This drug is given in a hospital or clinic and will not be stored at home. NOTE: This sheet is a summary. It may not cover all possible information. If you have questions about this medicine, talk to your doctor, pharmacist, or health care provider.  2015, Elsevier/Gold Standard. (2008-03-07 15:13:04)  

## 2014-05-09 ENCOUNTER — Ambulatory Visit: Payer: Medicare Other

## 2014-05-09 ENCOUNTER — Other Ambulatory Visit: Payer: Medicare Other | Admitting: Lab

## 2014-05-09 ENCOUNTER — Ambulatory Visit: Payer: Medicare Other | Admitting: Family

## 2014-05-09 ENCOUNTER — Ambulatory Visit (INDEPENDENT_AMBULATORY_CARE_PROVIDER_SITE_OTHER): Payer: Medicare Other | Admitting: Ophthalmology

## 2014-05-10 ENCOUNTER — Ambulatory Visit (INDEPENDENT_AMBULATORY_CARE_PROVIDER_SITE_OTHER): Payer: Medicare Other | Admitting: Ophthalmology

## 2014-05-10 DIAGNOSIS — H43813 Vitreous degeneration, bilateral: Secondary | ICD-10-CM

## 2014-05-10 DIAGNOSIS — E11311 Type 2 diabetes mellitus with unspecified diabetic retinopathy with macular edema: Secondary | ICD-10-CM

## 2014-05-10 DIAGNOSIS — H3531 Nonexudative age-related macular degeneration: Secondary | ICD-10-CM

## 2014-05-10 DIAGNOSIS — H35033 Hypertensive retinopathy, bilateral: Secondary | ICD-10-CM

## 2014-05-10 DIAGNOSIS — I10 Essential (primary) hypertension: Secondary | ICD-10-CM

## 2014-05-10 DIAGNOSIS — E11321 Type 2 diabetes mellitus with mild nonproliferative diabetic retinopathy with macular edema: Secondary | ICD-10-CM

## 2014-05-10 DIAGNOSIS — E11329 Type 2 diabetes mellitus with mild nonproliferative diabetic retinopathy without macular edema: Secondary | ICD-10-CM

## 2014-05-11 ENCOUNTER — Ambulatory Visit: Payer: Medicare Other | Admitting: Family

## 2014-05-11 ENCOUNTER — Ambulatory Visit: Payer: Medicare Other

## 2014-05-11 ENCOUNTER — Other Ambulatory Visit: Payer: Medicare Other | Admitting: Lab

## 2014-05-12 ENCOUNTER — Other Ambulatory Visit (HOSPITAL_BASED_OUTPATIENT_CLINIC_OR_DEPARTMENT_OTHER): Payer: Medicare Other | Admitting: Lab

## 2014-05-12 ENCOUNTER — Encounter: Payer: Self-pay | Admitting: Family

## 2014-05-12 ENCOUNTER — Ambulatory Visit (HOSPITAL_BASED_OUTPATIENT_CLINIC_OR_DEPARTMENT_OTHER): Payer: Medicare Other | Admitting: Family

## 2014-05-12 ENCOUNTER — Ambulatory Visit (HOSPITAL_BASED_OUTPATIENT_CLINIC_OR_DEPARTMENT_OTHER): Payer: Medicare Other

## 2014-05-12 VITALS — BP 110/56 | HR 58 | Temp 98.4°F | Resp 14 | Ht <= 58 in | Wt 121.0 lb

## 2014-05-12 DIAGNOSIS — D693 Immune thrombocytopenic purpura: Secondary | ICD-10-CM

## 2014-05-12 LAB — CBC WITH DIFFERENTIAL (CANCER CENTER ONLY)
BASO#: 0.1 10*3/uL (ref 0.0–0.2)
BASO%: 1.1 % (ref 0.0–2.0)
EOS ABS: 0.3 10*3/uL (ref 0.0–0.5)
EOS%: 6.1 % (ref 0.0–7.0)
HCT: 36.1 % (ref 34.8–46.6)
HGB: 12.6 g/dL (ref 11.6–15.9)
LYMPH#: 0.8 10*3/uL — AB (ref 0.9–3.3)
LYMPH%: 14.4 % (ref 14.0–48.0)
MCH: 34.3 pg — ABNORMAL HIGH (ref 26.0–34.0)
MCHC: 34.9 g/dL (ref 32.0–36.0)
MCV: 98 fL (ref 81–101)
MONO#: 0.5 10*3/uL (ref 0.1–0.9)
MONO%: 8.8 % (ref 0.0–13.0)
NEUT%: 69.6 % (ref 39.6–80.0)
NEUTROS ABS: 3.9 10*3/uL (ref 1.5–6.5)
PLATELETS: 111 10*3/uL — AB (ref 145–400)
RBC: 3.67 10*6/uL — AB (ref 3.70–5.32)
RDW: 12.7 % (ref 11.1–15.7)
WBC: 5.6 10*3/uL (ref 3.9–10.0)

## 2014-05-12 MED ORDER — ROMIPLOSTIM 250 MCG ~~LOC~~ SOLR
4.0000 ug/kg | Freq: Once | SUBCUTANEOUS | Status: AC
  Administered 2014-05-12: 215 ug via SUBCUTANEOUS
  Filled 2014-05-12: qty 0.43

## 2014-05-12 NOTE — Progress Notes (Signed)
[place]  Telephone:(336) [phone] Fax:(336) [phone]  ID: Lisa James OB: [birth year] MR#: [id number]#:[id number] Patient Care Team: Darlyne Russian, MD as PCP - General (Family Medicine)  DIAGNOSIS: Thrombocytopenia-likely immune thrombocytopenia  INTERVAL HISTORY: Lisa James is here today for a  followup. We have increased the frequency of her Nplate in order to keep her platelet count up and she is doing well with this. Today her platelets are up to 111. She is still feeling good. She has had not problems with bleeding or pain. Her appetite is good and she is drinking plenty of fluids. She denies fever, chills, cough, rash, headache, dizziness, SOB, chest pain, palpitations, abdominal pain, constipation, diarrhea, blood in urine or stool. She has had no bone pain. She denies swelling, tenderness, numbness or tingling in her extremities. 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 They have a lot of activities that she participates in and she also walks every day. She still really enjoys herself there.  CURRENT TREATMENT: Nplate as indicated  REVIEW OF SYSTEMS: All other 10 point review of systems is negative except for those issues mentioned above.   PAST MEDICAL HISTORY: Past Medical History  Diagnosis Date  . Acute MI, lateral wall   . Chronic systolic heart failure   . Ischemic heart disease   . Mitral regurgitation   . HTN (hypertension)   . DM (diabetes mellitus)   . Non Hodgkin's lymphoma     of the throat  . Immune thrombocytopenic purpura 01/21/2013   PAST SURGICAL HISTORY: Past Surgical History  Procedure Laterality Date  . Coronary artery bypass graft  11/20/2007    x5  . Bone marrow biopsy  11/22/2002    left posterior iliac crest bone marrow biopsy and aspirate  . Submental lymph node excisional biopsy  10/22/2002   FAMILY HISTORY Family History  Problem Relation Age of Onset  . Heart attack Father   . Diabetes Father    GYNECOLOGIC HISTORY:   No LMP recorded. Patient is postmenopausal.   SOCIAL HISTORY:  History   Social History  . Marital Status: Widowed    Spouse Name: N/A    Number of Children: N/A  . Years of Education: N/A   Occupational History  . Not on file.   Social History Main Topics  . Smoking status: Never Smoker   . Smokeless tobacco: Never Used     Comment: never used tobacco.  . Alcohol Use: No  . Drug Use: No  . Sexual Activity: No   Other Topics Concern  . Not on file   Social History Narrative  . No narrative on file   ADVANCED DIRECTIVES: <no information>  HEALTH MAINTENANCE: History  Substance Use Topics  . Smoking status: Never Smoker   . Smokeless tobacco: Never Used     Comment: never used tobacco.  . Alcohol Use: No   Colonoscopy: PAP: Bone density: Lipid panel:  Allergies  Allergen Reactions  . Ace Inhibitors Cough   Current Outpatient Prescriptions  Medication Sig Dispense Refill  . atorvastatin (LIPITOR) 20 MG tablet TAKE 1 TABLET (20 MG TOTAL) BY MOUTH DAILY.  30 tablet  9  . calcium carbonate (TUMS - DOSED IN MG ELEMENTAL CALCIUM) 500 MG chewable tablet Chew 1 tablet by mouth daily.      . Calcium Carbonate-Vitamin D (CALCIUM-VITAMIN D) 500-200 MG-UNIT per tablet Take 1 tablet by mouth every morning. CALCIUM 500 MG + VITAMIN D 3 1000 UNITS.      Marland Kitchen  carvedilol (COREG) 6.25 MG tablet TAKE 1 TABLET (6.25 MG TOTAL) BY MOUTH 2 (TWO) TIMES DAILY WITH A MEAL.  60 tablet  9  . Cholecalciferol (VITAMIN D) 1000 UNITS capsule Take 1,000 Units by mouth daily.      . furosemide (LASIX) 20 MG tablet TAKE 1 TABLET (20 MG TOTAL) BY MOUTH DAILY.  30 tablet  6  . irbesartan (AVAPRO) 300 MG tablet Take 1 tablet (300 mg total) by mouth daily.  30 tablet  4  . KLOR-CON M20 20 MEQ tablet TAKE 1 TABLET (20 MEQ TOTAL) BY MOUTH DAILY.  30 tablet  4  . mupirocin ointment (BACTROBAN) 2 % Apply to left ear lobe 3 times a day  22 g  0  . romiPLOStim (NPLATE) 250 MCG injection Inject 1 mcg/kg  into the skin as needed. Given @ Oncology      . sertraline (ZOLOFT) 25 MG tablet TAKE 1 TABLET (25 MG TOTAL) BY MOUTH DAILY.  30 tablet  4   No current facility-administered medications for this visit.   Facility-Administered Medications Ordered in Other Visits  Medication Dose Route Frequency Provider Last Rate Last Dose  . diphenhydrAMINE (BENADRYL) tablet 25 mg  25 mg Oral Q6H PRN Volanda Napoleon, MD   25 mg at 01/28/13 0913   OBJECTIVE: Filed Vitals:   05/12/14 1023  BP: 110/56  Pulse: 58  Temp: 98.4 F (36.9 C)  Resp: 14   Body mass index is 25.3 kg/(m^2). ECOG FS:0 - Asymptomatic Ocular: Sclerae unicteric, pupils equal, round and reactive to light Ear-nose-throat: Oropharynx clear, dentition fair Lymphatic: No cervical or supraclavicular adenopathy Lungs no rales or rhonchi, good excursion bilaterally Heart regular rate and rhythm, no murmur appreciated Abd soft, nontender, positive bowel sounds MSK no focal spinal tenderness, no joint edema Neuro: non-focal, well-oriented, appropriate affect Breasts: Deferred  LAB RESULTS: CMP     Component Value Date/Time   NA 136 03/26/2014 0956   NA 142 01/21/2013 1157   K 3.9 03/26/2014 0956   K 5.3* 01/21/2013 1157   CL 102 03/26/2014 0956   CL 103 01/21/2013 1157   CO2 26 03/26/2014 0956   CO2 27 01/21/2013 1157   GLUCOSE 263* 03/26/2014 0956   GLUCOSE 123* 01/21/2013 1157   BUN 25* 03/26/2014 0956   BUN 32* 01/21/2013 1157   CREATININE 1.35* 03/26/2014 0956   CREATININE 1.54* 11/22/2012 1022   CALCIUM 9.3 03/26/2014 0956   CALCIUM 9.8 01/21/2013 1157   PROT 6.2 03/26/2014 0956   PROT 6.4 01/21/2013 1157   ALBUMIN 4.4 03/26/2014 0956   AST 18 03/26/2014 0956   AST 24 01/21/2013 1157   ALT 13 03/26/2014 0956   ALT 18 01/21/2013 1157   ALKPHOS 95 03/26/2014 0956   ALKPHOS 67 01/21/2013 1157   BILITOT 1.8* 03/26/2014 0956   BILITOT 1.8* 03/26/2014 0956   BILITOT 1.10 01/21/2013 1157   GFRNONAA 30* 11/22/2012 1022   GFRAA 35* 11/22/2012 1022   No results found for this  basename: SPEP,  UPEP,   kappa and lambda light chains   Lab Results  Component Value Date   WBC 5.6 05/12/2014   NEUTROABS 3.9 05/12/2014   HGB 12.6 05/12/2014   HCT 36.1 05/12/2014   MCV 98 05/12/2014   PLT 111* 05/12/2014   No results found for this basename: LABCA2   No components found with this basename: [id number]   No results found for this basename: INR,  in the last 168 hours  STUDIES: None  ASSESSMENT/PLAN: Lisa James is an 80 year old female with thrombocytopenia. This is likely immune thrombocytopenia. She had a bone marrow biopsy last year.  Today her platelets are up to 111. We will give her Nplate today.  We will see her in 2 weeks for labs and follow-up. She is in agreement and knows to call with any questions or concerns and go to the ED in the event of an emergency. We can certainly see her sooner if need be.   Eliezer Bottom, NP 05/12/2014 11:28 AM

## 2014-05-12 NOTE — Progress Notes (Signed)
Number for transportation - [phone]

## 2014-05-12 NOTE — Patient Instructions (Addendum)

## 2014-05-17 NOTE — H&P (Signed)
Lisa James is an 80 y.o. female.   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 Vitreomacular traction left eye  Past Medical History  Diagnosis Date  . Acute MI, lateral wall   . Chronic systolic heart failure   . Ischemic heart disease   . Mitral regurgitation   . HTN (hypertension)   . DM (diabetes mellitus)   . Non Hodgkin's lymphoma     of the throat  . Immune thrombocytopenic purpura 01/21/2013    Past Surgical History  Procedure Laterality Date  . Coronary artery bypass graft  11/20/2007    x5  . Bone marrow biopsy  11/22/2002    left posterior iliac crest bone marrow biopsy and aspirate  . Submental lymph node excisional biopsy  10/22/2002    Family History  Problem Relation Age of Onset  . Heart attack Father   . Diabetes Father    Social History:  reports that she has never smoked. She has never used smokeless tobacco. She reports that she does not drink alcohol or use illicit drugs.  Allergies:  Allergies  Allergen Reactions  . Ace Inhibitors Cough    No prescriptions prior to admission    Review of systems otherwise negative  There were no vitals taken for this visit.  Physical exam: Mental status: oriented x3. Eyes: See eye exam associated with this date of surgery in media tab.  Scanned in by scanning center Ears, Nose, Throat: within normal limits Neck: Within Normal limits General: within normal limits Chest: Within normal limits Breast: deferred Heart: Within normal limits Abdomen: Within normal limits GU: deferred Extremities: within normal limits Skin: within normal limits  Assessment/Plan Preretinal fibrosis, macular pucker, vitreomacular traction, possible macular hole left eye Plan: To [facility] for Pars plana vitrectomy, membrane peel, laser, possible serum patch, gas injection left eye  MATTHEWS, JOHN D 05/17/2014, 7:32 AM

## 2014-05-18 ENCOUNTER — Other Ambulatory Visit: Payer: Self-pay | Admitting: Cardiovascular Disease

## 2014-05-18 ENCOUNTER — Other Ambulatory Visit: Payer: Self-pay | Admitting: Emergency Medicine

## 2014-05-18 NOTE — Telephone Encounter (Signed)
Dr Everlene Farrier, you saw pt in Sept, but don't see this med discussed. Do you want to RF?

## 2014-05-18 NOTE — Telephone Encounter (Signed)
Rx was sent to pharmacy electronically. 

## 2014-05-25 ENCOUNTER — Other Ambulatory Visit: Payer: Self-pay | Admitting: *Deleted

## 2014-05-25 DIAGNOSIS — C8590 Non-Hodgkin lymphoma, unspecified, unspecified site: Secondary | ICD-10-CM

## 2014-05-26 ENCOUNTER — Other Ambulatory Visit (HOSPITAL_BASED_OUTPATIENT_CLINIC_OR_DEPARTMENT_OTHER): Payer: Medicare Other | Admitting: Lab

## 2014-05-26 ENCOUNTER — Ambulatory Visit (HOSPITAL_BASED_OUTPATIENT_CLINIC_OR_DEPARTMENT_OTHER): Payer: Medicare Other | Admitting: Family

## 2014-05-26 ENCOUNTER — Encounter: Payer: Self-pay | Admitting: Family

## 2014-05-26 DIAGNOSIS — D693 Immune thrombocytopenic purpura: Secondary | ICD-10-CM

## 2014-05-26 DIAGNOSIS — C8590 Non-Hodgkin lymphoma, unspecified, unspecified site: Secondary | ICD-10-CM

## 2014-05-26 LAB — CBC WITH DIFFERENTIAL (CANCER CENTER ONLY)
BASO#: 0.1 10*3/uL (ref 0.0–0.2)
BASO%: 0.6 % (ref 0.0–2.0)
EOS%: 9.1 % — AB (ref 0.0–7.0)
Eosinophils Absolute: 0.7 10*3/uL — ABNORMAL HIGH (ref 0.0–0.5)
HEMATOCRIT: 36.2 % (ref 34.8–46.6)
HEMOGLOBIN: 12.6 g/dL (ref 11.6–15.9)
LYMPH#: 1.3 10*3/uL (ref 0.9–3.3)
LYMPH%: 15.7 % (ref 14.0–48.0)
MCH: 34.1 pg — AB (ref 26.0–34.0)
MCHC: 34.8 g/dL (ref 32.0–36.0)
MCV: 98 fL (ref 81–101)
MONO#: 0.7 10*3/uL (ref 0.1–0.9)
MONO%: 9.2 % (ref 0.0–13.0)
NEUT%: 65.4 % (ref 39.6–80.0)
NEUTROS ABS: 5.2 10*3/uL (ref 1.5–6.5)
Platelets: 202 10*3/uL (ref 145–400)
RBC: 3.7 10*6/uL (ref 3.70–5.32)
RDW: 12.3 % (ref 11.1–15.7)
WBC: 8 10*3/uL (ref 3.9–10.0)

## 2014-05-26 NOTE — Progress Notes (Signed)
[place]  Telephone:(336) [phone] Fax:(336) [phone]  ID: Lisa James OB: 05-02-1928 MR#: [id number]#:[id number] Patient Care Team: Darlyne Russian, MD as PCP - General (Family Medicine)  DIAGNOSIS: Thrombocytopenia-likely immune thrombocytopenia  INTERVAL HISTORY: Lisa James is here today for a follow-up. We have increased the frequency of her Nplate in order to keep her platelet count up and she is doing well with this. Today her platelets are up to 202. She is still feeling good. She has had not problems with bleeding or pain. Her appetite is good and she is drinking plenty of fluids. She denies fever, chills, cough, rash, headache, dizziness, SOB, chest pain, palpitations, abdominal pain, constipation, diarrhea, blood in urine or stool. She has had no bone pain. She denies swelling, tenderness, numbness or tingling in her extremities. 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 She still walks every day and really enjoys herself there.  CURRENT TREATMENT: Nplate as indicated  REVIEW OF SYSTEMS: All other 10 point review of systems is negative except for those issues mentioned above.   PAST MEDICAL HISTORY: Past Medical History  Diagnosis Date  . Acute MI, lateral wall   . Chronic systolic heart failure   . Ischemic heart disease   . Mitral regurgitation   . HTN (hypertension)   . DM (diabetes mellitus)   . Non Hodgkin's lymphoma     of the throat  . Immune thrombocytopenic purpura 01/21/2013   PAST SURGICAL HISTORY: Past Surgical History  Procedure Laterality Date  . Coronary artery bypass graft  11/20/2007    x5  . Bone marrow biopsy  11/22/2002    left posterior iliac crest bone marrow biopsy and aspirate  . Submental lymph node excisional biopsy  10/22/2002   FAMILY HISTORY Family History  Problem Relation Age of Onset  . Heart attack Father   . Diabetes Father    GYNECOLOGIC HISTORY:  No LMP recorded. Patient is postmenopausal.   SOCIAL HISTORY:   History   Social History  . Marital Status: Widowed    Spouse Name: N/A    Number of Children: N/A  . Years of Education: N/A   Occupational History  . Not on file.   Social History Main Topics  . Smoking status: Never Smoker   . Smokeless tobacco: Never Used     Comment: never used tobacco.  . Alcohol Use: No  . Drug Use: No  . Sexual Activity: No   Other Topics Concern  . Not on file   Social History Narrative   ADVANCED DIRECTIVES: <no information>  HEALTH MAINTENANCE: History  Substance Use Topics  . Smoking status: Never Smoker   . Smokeless tobacco: Never Used     Comment: never used tobacco.  . Alcohol Use: No   Colonoscopy: PAP: Bone density: Lipid panel:  Allergies  Allergen Reactions  . Ace Inhibitors Cough   Current Outpatient Prescriptions  Medication Sig Dispense Refill  . atorvastatin (LIPITOR) 20 MG tablet TAKE 1 TABLET (20 MG TOTAL) BY MOUTH DAILY. 30 tablet 9  . calcium carbonate (TUMS - DOSED IN MG ELEMENTAL CALCIUM) 500 MG chewable tablet Chew 1 tablet by mouth daily.    . Calcium Carbonate-Vitamin D (CALCIUM-VITAMIN D) 500-200 MG-UNIT per tablet Take 1 tablet by mouth every morning. CALCIUM 500 MG + VITAMIN D 3 1000 UNITS.    Marland Kitchen carvedilol (COREG) 6.25 MG tablet TAKE 1 TABLET (6.25 MG TOTAL) BY MOUTH 2 (TWO) TIMES DAILY WITH A MEAL. 60 tablet 9  .  Cholecalciferol (VITAMIN D) 1000 UNITS capsule Take 1,000 Units by mouth daily.    . furosemide (LASIX) 20 MG tablet TAKE 1 TABLET (20 MG TOTAL) BY MOUTH DAILY. 30 tablet 6  . irbesartan (AVAPRO) 300 MG tablet Take 1 tablet (300 mg total) by mouth daily. <please make appointment for refills> 30 tablet 1  . KLOR-CON M20 20 MEQ tablet TAKE 1 TABLET (20 MEQ TOTAL) BY MOUTH DAILY. 30 tablet 4  . mupirocin ointment (BACTROBAN) 2 % Apply to left ear lobe 3 times a day 22 g 0  . romiPLOStim (NPLATE) 250 MCG injection Inject 1 mcg/kg into the skin as needed. Given @ Oncology    . sertraline (ZOLOFT) 25 MG  tablet TAKE 1 TABLET (25 MG TOTAL) BY MOUTH DAILY. 30 tablet 4   No current facility-administered medications for this visit.   Facility-Administered Medications Ordered in Other Visits  Medication Dose Route Frequency Provider Last Rate Last Dose  . diphenhydrAMINE (BENADRYL) tablet 25 mg  25 mg Oral Q6H PRN Volanda Napoleon, MD   25 mg at 01/28/13 0913   OBJECTIVE: Filed Vitals:   05/26/14 1207  BP: 125/88  Pulse: 61  Temp: 97.6 F (36.4 C)  Resp: 20   Body mass index is 25.51 kg/(m^2). ECOG FS:0 - Asymptomatic Ocular: Sclerae unicteric, pupils equal, round and reactive to light Ear-nose-throat: Oropharynx clear, dentition fair Lymphatic: No cervical or supraclavicular adenopathy Lungs no rales or rhonchi, good excursion bilaterally Heart regular rate and rhythm, no murmur appreciated Abd soft, nontender, positive bowel sounds MSK no focal spinal tenderness, no joint edema Neuro: non-focal, well-oriented, appropriate affect Breasts: Deferred  LAB RESULTS: CMP     Component Value Date/Time   NA 136 03/26/2014 0956   NA 142 01/21/2013 1157   K 3.9 03/26/2014 0956   K 5.3* 01/21/2013 1157   CL 102 03/26/2014 0956   CL 103 01/21/2013 1157   CO2 26 03/26/2014 0956   CO2 27 01/21/2013 1157   GLUCOSE 263* 03/26/2014 0956   GLUCOSE 123* 01/21/2013 1157   BUN 25* 03/26/2014 0956   BUN 32* 01/21/2013 1157   CREATININE 1.35* 03/26/2014 0956   CREATININE 1.54* 11/22/2012 1022   CALCIUM 9.3 03/26/2014 0956   CALCIUM 9.8 01/21/2013 1157   PROT 6.2 03/26/2014 0956   PROT 6.4 01/21/2013 1157   ALBUMIN 4.4 03/26/2014 0956   AST 18 03/26/2014 0956   AST 24 01/21/2013 1157   ALT 13 03/26/2014 0956   ALT 18 01/21/2013 1157   ALKPHOS 95 03/26/2014 0956   ALKPHOS 67 01/21/2013 1157   BILITOT 1.8* 03/26/2014 0956   BILITOT 1.8* 03/26/2014 0956   BILITOT 1.10 01/21/2013 1157   GFRNONAA 30* 11/22/2012 1022   GFRAA 35* 11/22/2012 1022   No results found for: SPEP Lab Results   Component Value Date   WBC 8.0 05/26/2014   NEUTROABS 5.2 05/26/2014   HGB 12.6 05/26/2014   HCT 36.2 05/26/2014   MCV 98 05/26/2014   PLT 202 05/26/2014   No results found for: LABCA2 No components found for: [id number] No results for input(s): INR in the last 168 hours.  STUDIES: None  ASSESSMENT/PLAN: Ms. Blaschke is an 80 year old female with thrombocytopenia. This is likely immune thrombocytopenia. She had a bone marrow biopsy last year.  Today her platelets are up to 202.  We will see her again in 2 weeks for labs and follow-up. She is in agreement and knows to call with any questions or concerns and go to  the ED in the event of an emergency. We can certainly see her sooner if need be.   Eliezer Bottom, NP 05/26/2014 12:52 PM

## 2014-06-03 ENCOUNTER — Other Ambulatory Visit (HOSPITAL_COMMUNITY): Payer: Medicare Other

## 2014-06-09 ENCOUNTER — Ambulatory Visit (HOSPITAL_BASED_OUTPATIENT_CLINIC_OR_DEPARTMENT_OTHER): Payer: Medicare Other

## 2014-06-09 ENCOUNTER — Encounter: Payer: Self-pay | Admitting: Family

## 2014-06-09 ENCOUNTER — Ambulatory Visit (HOSPITAL_BASED_OUTPATIENT_CLINIC_OR_DEPARTMENT_OTHER): Payer: Medicare Other | Admitting: Family

## 2014-06-09 ENCOUNTER — Other Ambulatory Visit (HOSPITAL_BASED_OUTPATIENT_CLINIC_OR_DEPARTMENT_OTHER): Payer: Medicare Other | Admitting: Lab

## 2014-06-09 ENCOUNTER — Telehealth: Payer: Self-pay

## 2014-06-09 ENCOUNTER — Telehealth: Payer: Self-pay | Admitting: Emergency Medicine

## 2014-06-09 DIAGNOSIS — D696 Thrombocytopenia, unspecified: Secondary | ICD-10-CM

## 2014-06-09 DIAGNOSIS — D693 Immune thrombocytopenic purpura: Secondary | ICD-10-CM

## 2014-06-09 LAB — CBC WITH DIFFERENTIAL (CANCER CENTER ONLY)
BASO#: 0 10*3/uL (ref 0.0–0.2)
BASO%: 0.4 % (ref 0.0–2.0)
EOS%: 8.1 % — AB (ref 0.0–7.0)
Eosinophils Absolute: 0.6 10*3/uL — ABNORMAL HIGH (ref 0.0–0.5)
HCT: 36.4 % (ref 34.8–46.6)
HGB: 12.6 g/dL (ref 11.6–15.9)
LYMPH#: 1.2 10*3/uL (ref 0.9–3.3)
LYMPH%: 16.5 % (ref 14.0–48.0)
MCH: 34.1 pg — AB (ref 26.0–34.0)
MCHC: 34.6 g/dL (ref 32.0–36.0)
MCV: 99 fL (ref 81–101)
MONO#: 0.6 10*3/uL (ref 0.1–0.9)
MONO%: 8.6 % (ref 0.0–13.0)
NEUT%: 66.4 % (ref 39.6–80.0)
NEUTROS ABS: 4.9 10*3/uL (ref 1.5–6.5)
RBC: 3.69 10*6/uL — ABNORMAL LOW (ref 3.70–5.32)
RDW: 12 % (ref 11.1–15.7)
WBC: 7.3 10*3/uL (ref 3.9–10.0)

## 2014-06-09 MED ORDER — ROMIPLOSTIM 250 MCG ~~LOC~~ SOLR
4.0000 ug/kg | Freq: Once | SUBCUTANEOUS | Status: AC
  Administered 2014-06-09: 215 ug via SUBCUTANEOUS
  Filled 2014-06-09: qty 0.43

## 2014-06-09 NOTE — Telephone Encounter (Signed)
See other note. Dr. Everlene Farrier called pt to be sure Dr. Zigmund Daniel knew about pt's low platelet count. Spoke with niece. Dr. Zigmund Daniel sent Dr. Marin Olp a clearance for surgery form and Dr. Marin Olp approved it and sent it back.

## 2014-06-09 NOTE — Telephone Encounter (Signed)
I called and left a message with a net to be sure Dr. Zigmund Daniel was aware of her low platelet count.

## 2014-06-09 NOTE — Progress Notes (Signed)
[place]  Telephone:(336) [phone] Fax:(336) [phone]  ID: Lisa James OB: Jul 06, 1928 MR#: [id number] Patient Care Team: Darlyne Russian, MD as PCP - General (Family Medicine)  DIAGNOSIS: Thrombocytopenia-likely immune thrombocytopenia  INTERVAL HISTORY: Lisa James is here today for a follow-up. We have increased the frequency of her Nplate in order to keep her platelet count up and she is doing well with this. She is still feeling good. Today her platelets are 47. She has had no bleeding.  Her appetite is good and she is drinking plenty of fluids. She denies fatigue, fever, chills, cough, rash, headache, dizziness, SOB, chest pain, palpitations, abdominal pain, constipation, diarrhea, blood in urine or stool. She has had no bone pain. She denies swelling, tenderness, numbness or tingling in her extremities. She has no c/o pain.  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 She still walks every day and really enjoys herself there.  CURRENT TREATMENT: Nplate as indicated  REVIEW OF SYSTEMS: All other 10 point review of systems is negative except for those issues mentioned above.   PAST MEDICAL HISTORY: Past Medical History  Diagnosis Date  . Acute MI, lateral wall   . Chronic systolic heart failure   . Ischemic heart disease   . Mitral regurgitation   . HTN (hypertension)   . DM (diabetes mellitus)   . Non Hodgkin's lymphoma     of the throat  . Immune thrombocytopenic purpura 01/21/2013   PAST SURGICAL HISTORY: Past Surgical History  Procedure Laterality Date  . Coronary artery bypass graft  11/20/2007    x5  . Bone marrow biopsy  11/22/2002    left posterior iliac crest bone marrow biopsy and aspirate  . Submental lymph node excisional biopsy  10/22/2002   FAMILY HISTORY Family History  Problem Relation Age of Onset  . Heart attack Father   . Diabetes Father    GYNECOLOGIC HISTORY:  No LMP recorded. Patient is postmenopausal.   SOCIAL  HISTORY:  History   Social History  . Marital Status: Widowed    Spouse Name: N/A    Number of Children: N/A  . Years of Education: N/A   Occupational History  . Not on file.   Social History Main Topics  . Smoking status: Never Smoker   . Smokeless tobacco: Never Used     Comment: never used tobacco.  . Alcohol Use: No  . Drug Use: No  . Sexual Activity: No   Other Topics Concern  . Not on file   Social History Narrative   ADVANCED DIRECTIVES: <no information>  HEALTH MAINTENANCE: History  Substance Use Topics  . Smoking status: Never Smoker   . Smokeless tobacco: Never Used     Comment: never used tobacco.  . Alcohol Use: No   Colonoscopy: PAP: Bone density: Lipid panel:  Allergies  Allergen Reactions  . Ace Inhibitors Cough   Current Outpatient Prescriptions  Medication Sig Dispense Refill  . atorvastatin (LIPITOR) 20 MG tablet TAKE 1 TABLET (20 MG TOTAL) BY MOUTH DAILY. 30 tablet 9  . calcium carbonate (TUMS - DOSED IN MG ELEMENTAL CALCIUM) 500 MG chewable tablet Chew 1 tablet by mouth daily.    . Calcium Carbonate-Vitamin D (CALCIUM-VITAMIN D) 500-200 MG-UNIT per tablet Take 1 tablet by mouth every morning. CALCIUM 500 MG + VITAMIN D 3 1000 UNITS.    Marland Kitchen carvedilol (COREG) 6.25 MG tablet TAKE 1 TABLET (6.25 MG TOTAL) BY MOUTH 2 (TWO) TIMES DAILY WITH A MEAL. [place]  tablet 9  . Cholecalciferol (VITAMIN D) 1000 UNITS capsule Take 1,000 Units by mouth daily.    . furosemide (LASIX) 20 MG tablet TAKE 1 TABLET (20 MG TOTAL) BY MOUTH DAILY. 30 tablet 6  . irbesartan (AVAPRO) 300 MG tablet Take 1 tablet (300 mg total) by mouth daily. <please make appointment for refills> 30 tablet 1  . KLOR-CON M20 20 MEQ tablet TAKE 1 TABLET (20 MEQ TOTAL) BY MOUTH DAILY. 30 tablet 4  . mupirocin ointment (BACTROBAN) 2 % Apply to left ear lobe 3 times a day 22 g 0  . romiPLOStim (NPLATE) 250 MCG injection Inject 1 mcg/kg into the skin as needed. Given @ Oncology    . sertraline  (ZOLOFT) 25 MG tablet TAKE 1 TABLET (25 MG TOTAL) BY MOUTH DAILY. 30 tablet 4   No current facility-administered medications for this visit.   Facility-Administered Medications Ordered in Other Visits  Medication Dose Route Frequency Provider Last Rate Last Dose  . diphenhydrAMINE (BENADRYL) tablet 25 mg  25 mg Oral Q6H PRN Volanda Napoleon, MD   25 mg at 01/28/13 0913   OBJECTIVE: Filed Vitals:   06/09/14 1040  BP: 113/70  Pulse: 76  Temp: 98 F (36.7 C)  Resp: 18   Body mass index is 25.71 kg/(m^2). ECOG FS:0 - Asymptomatic Ocular: Sclerae unicteric, pupils equal, round and reactive to light Ear-nose-throat: Oropharynx clear, dentition fair Lymphatic: No cervical or supraclavicular adenopathy Lungs no rales or rhonchi, good excursion bilaterally Heart regular rate and rhythm, no murmur appreciated Abd soft, nontender, positive bowel sounds MSK no focal spinal tenderness, no joint edema Neuro: non-focal, well-oriented, appropriate affect Breasts: Deferred  LAB RESULTS: CMP     Component Value Date/Time   NA 136 03/26/2014 0956   NA 142 01/21/2013 1157   K 3.9 03/26/2014 0956   K 5.3* 01/21/2013 1157   CL 102 03/26/2014 0956   CL 103 01/21/2013 1157   CO2 26 03/26/2014 0956   CO2 27 01/21/2013 1157   GLUCOSE 263* 03/26/2014 0956   GLUCOSE 123* 01/21/2013 1157   BUN 25* 03/26/2014 0956   BUN 32* 01/21/2013 1157   CREATININE 1.35* 03/26/2014 0956   CREATININE 1.54* 11/22/2012 1022   CALCIUM 9.3 03/26/2014 0956   CALCIUM 9.8 01/21/2013 1157   PROT 6.2 03/26/2014 0956   PROT 6.4 01/21/2013 1157   ALBUMIN 4.4 03/26/2014 0956   AST 18 03/26/2014 0956   AST 24 01/21/2013 1157   ALT 13 03/26/2014 0956   ALT 18 01/21/2013 1157   ALKPHOS 95 03/26/2014 0956   ALKPHOS 67 01/21/2013 1157   BILITOT 1.8* 03/26/2014 0956   BILITOT 1.8* 03/26/2014 0956   BILITOT 1.10 01/21/2013 1157   GFRNONAA 30* 11/22/2012 1022   GFRAA 35* 11/22/2012 1022   No results found for:  SPEP Lab Results  Component Value Date   WBC 7.3 06/09/2014   NEUTROABS 4.9 06/09/2014   HGB 12.6 06/09/2014   HCT 36.4 06/09/2014   MCV 99 06/09/2014   PLT 47 Platelet count confirmed by slide estimate* 06/09/2014   No results found for: LABCA2 No components found for: [id number] No results for input(s): INR in the last 168 hours.  STUDIES: None  ASSESSMENT/PLAN: Lisa James is an 80 year old female with thrombocytopenia. This is likely immune thrombocytopenia. She had a bone marrow biopsy last year. She is doing well and is asymptomatic at this time.  Today her platelets are 47. We will give her Nplate today.  We will see her  again in 2 weeks for labs and follow-up. She is in agreement and knows to call with any questions or concerns and go to the ED in the event of an emergency. We can certainly see her sooner if need be.   Eliezer Bottom, NP 06/09/2014 10:57 AM

## 2014-06-09 NOTE — Patient Instructions (Signed)
Romiplostim injection What is this medicine? ROMIPLOSTIM (roe mi PLOE stim) helps your body make more platelets. This medicine is used to treat low platelets caused by chronic idiopathic thrombocytopenic purpura (ITP). This medicine may be used for other purposes; ask your health care provider or pharmacist if you have questions. COMMON BRAND NAME(S): Nplate What should I tell my health care provider before I take this medicine? They need to know if you have any of these conditions: -cancer or myelodysplastic syndrome -low blood counts, like low white cell, platelet, or red cell counts -take medicines that treat or prevent blood clots -an unusual or allergic reaction to romiplostim, mannitol, other medicines, foods, dyes, or preservatives -pregnant or trying to get pregnant -breast-feeding How should I use this medicine? This medicine is for injection under the skin. It is given by a health care professional in a hospital or clinic setting. A special MedGuide will be given to you before your injection. Read this information carefully each time. Talk to your pediatrician regarding the use of this medicine in children. Special care may be needed. Overdosage: If you think you have taken too much of this medicine contact a poison control center or emergency room at once. NOTE: This medicine is only for you. Do not share this medicine with others. What if I miss a dose? It is important not to miss your dose. Call your doctor or health care professional if you are unable to keep an appointment. What may interact with this medicine? Interactions are not expected. This list may not describe all possible interactions. Give your health care provider a list of all the medicines, herbs, non-prescription drugs, or dietary supplements you use. Also tell them if you smoke, drink alcohol, or use illegal drugs. Some items may interact with your medicine. What should I watch for while using this  medicine? Your condition will be monitored carefully while you are receiving this medicine. Visit your prescriber or health care professional for regular checks on your progress and for the needed blood tests. It is important to keep all appointments. What side effects may I notice from receiving this medicine? Side effects that you should report to your doctor or health care professional as soon as possible: -allergic reactions like skin rash, itching or hives, swelling of the face, lips, or tongue -shortness of breath, chest pain, swelling in a leg -unusual bleeding or bruising Side effects that usually do not require medical attention (report to your doctor or health care professional if they continue or are bothersome): -dizziness -headache -muscle aches -pain in arms and legs -stomach pain -trouble sleeping This list may not describe all possible side effects. Call your doctor for medical advice about side effects. You may report side effects to FDA at 1-800-FDA-1088. Where should I keep my medicine? This drug is given in a hospital or clinic and will not be stored at home. NOTE: This sheet is a summary. It may not cover all possible information. If you have questions about this medicine, talk to your doctor, pharmacist, or health care provider.  2015, Elsevier/Gold Standard. (2008-03-07 15:13:04)  

## 2014-06-09 NOTE — Telephone Encounter (Signed)
Lisa James is returning a call. Please call at work if calling before 5. If after 5, please call cell phone [phone]

## 2014-06-10 ENCOUNTER — Encounter (HOSPITAL_COMMUNITY)
Admission: RE | Admit: 2014-06-10 | Discharge: 2014-06-10 | Disposition: A | Discharge status: Home / Self Care | Payer: Medicare Other | Source: Ambulatory Visit | Attending: Ophthalmology | Admitting: Ophthalmology

## 2014-06-10 ENCOUNTER — Encounter (HOSPITAL_COMMUNITY): Payer: Self-pay

## 2014-06-10 ENCOUNTER — Encounter (HOSPITAL_COMMUNITY): Payer: Self-pay | Admitting: Emergency Medicine

## 2014-06-10 DIAGNOSIS — I2510 Atherosclerotic heart disease of native coronary artery without angina pectoris: Secondary | ICD-10-CM | POA: Insufficient documentation

## 2014-06-10 DIAGNOSIS — I350 Nonrheumatic aortic (valve) stenosis: Secondary | ICD-10-CM | POA: Diagnosis not present

## 2014-06-10 DIAGNOSIS — I10 Essential (primary) hypertension: Secondary | ICD-10-CM | POA: Insufficient documentation

## 2014-06-10 DIAGNOSIS — Z951 Presence of aortocoronary bypass graft: Secondary | ICD-10-CM | POA: Insufficient documentation

## 2014-06-10 DIAGNOSIS — R9431 Abnormal electrocardiogram [ECG] [EKG]: Secondary | ICD-10-CM | POA: Diagnosis not present

## 2014-06-10 DIAGNOSIS — C8590 Non-Hodgkin lymphoma, unspecified, unspecified site: Secondary | ICD-10-CM | POA: Diagnosis not present

## 2014-06-10 DIAGNOSIS — I517 Cardiomegaly: Secondary | ICD-10-CM | POA: Insufficient documentation

## 2014-06-10 DIAGNOSIS — I340 Nonrheumatic mitral (valve) insufficiency: Secondary | ICD-10-CM | POA: Insufficient documentation

## 2014-06-10 DIAGNOSIS — I5022 Chronic systolic (congestive) heart failure: Secondary | ICD-10-CM | POA: Diagnosis not present

## 2014-06-10 DIAGNOSIS — I252 Old myocardial infarction: Secondary | ICD-10-CM | POA: Diagnosis not present

## 2014-06-10 DIAGNOSIS — N189 Chronic kidney disease, unspecified: Secondary | ICD-10-CM | POA: Insufficient documentation

## 2014-06-10 DIAGNOSIS — Z01818 Encounter for other preprocedural examination: Secondary | ICD-10-CM | POA: Insufficient documentation

## 2014-06-10 DIAGNOSIS — D696 Thrombocytopenia, unspecified: Secondary | ICD-10-CM | POA: Diagnosis not present

## 2014-06-10 DIAGNOSIS — I6203 Nontraumatic chronic subdural hemorrhage: Secondary | ICD-10-CM | POA: Insufficient documentation

## 2014-06-10 DIAGNOSIS — R001 Bradycardia, unspecified: Secondary | ICD-10-CM | POA: Insufficient documentation

## 2014-06-10 DIAGNOSIS — I071 Rheumatic tricuspid insufficiency: Secondary | ICD-10-CM | POA: Insufficient documentation

## 2014-06-10 DIAGNOSIS — E119 Type 2 diabetes mellitus without complications: Secondary | ICD-10-CM | POA: Insufficient documentation

## 2014-06-10 HISTORY — DX: Traumatic subdural hemorrhage with loss of consciousness of unspecified duration, initial encounter: S06.5X9A

## 2014-06-10 HISTORY — DX: Thrombocytopenia, unspecified: D69.6

## 2014-06-10 HISTORY — DX: Chronic kidney disease, unspecified: N18.9

## 2014-06-10 HISTORY — DX: Nonrheumatic aortic (valve) stenosis: I35.0

## 2014-06-10 HISTORY — DX: Atherosclerotic heart disease of native coronary artery without angina pectoris: I25.10

## 2014-06-10 LAB — BASIC METABOLIC PANEL
ANION GAP: 13 (ref 5–15)
BUN: 31 mg/dL — ABNORMAL HIGH (ref 6–23)
CO2: 26 mEq/L (ref 19–32)
Calcium: 9.9 mg/dL (ref 8.4–10.5)
Chloride: 98 mEq/L (ref 96–112)
Creatinine, Ser: 1.55 mg/dL — ABNORMAL HIGH (ref 0.50–1.10)
GFR, EST AFRICAN AMERICAN: 34 mL/min — AB (ref >90)
GFR, EST NON AFRICAN AMERICAN: 30 mL/min — AB (ref >90)
Glucose, Bld: 132 mg/dL — ABNORMAL HIGH (ref 70–99)
POTASSIUM: 4.6 meq/L (ref 3.7–5.3)
Sodium: 137 mEq/L (ref 137–147)

## 2014-06-10 LAB — CBC
HCT: 36.6 % (ref 36.0–46.0)
Hemoglobin: 12.7 g/dL (ref 12.0–15.0)
MCH: 33.9 pg (ref 26.0–34.0)
MCHC: 34.7 g/dL (ref 30.0–36.0)
MCV: 97.6 fL (ref 78.0–100.0)
PLATELETS: 57 10*3/uL — AB (ref 150–400)
RBC: 3.75 MIL/uL — AB (ref 3.87–5.11)
RDW: 12.7 % (ref 11.5–15.5)
WBC: 8.3 10*3/uL (ref 4.0–10.5)

## 2014-06-10 NOTE — Progress Notes (Signed)
Platelet count 57,000 called to Dr Zigmund Daniel. Order given for repeat Platelet count DOS.

## 2014-06-10 NOTE — Pre-Procedure Instructions (Signed)
Lisa James  06/10/2014   Your procedure is scheduled on:  Tuesday, November 24  Report to [facility] Admitting at 0900 AM. ( coming from Dr Zigmund Daniel office)  Call this number if you have problems the morning of surgery: [phone]   Remember:   Do not eat food or drink liquids after midnight. Monday night   Take these medicines the morning of surgery with A SIP OF WATER: carvedilol (coreg)   Do not wear jewelry, make-up or nail polish.  Do not wear lotions, powders, or perfumes. You may wear deodorant.  Do not shave 48 hours prior to surgery. .  Do not bring valuables to the hospital.  [facility] is not responsible   for any belongings or valuables.               Contacts, dentures or bridgework may not be worn into surgery.  Leave suitcase in the car. After surgery it may be brought to your room.  For patients admitted to the hospital, discharge time is determined by your    treatment team.                  Special Instructions: [place] - Preparing for Surgery  Before surgery, you can play an important role.  Because skin is not sterile, your skin needs to be as free of germs as possible.  You can reduce the number of germs on you skin by washing with CHG (chlorahexidine gluconate) soap before surgery.  CHG is an antiseptic cleaner which kills germs and bonds with the skin to continue killing germs even after washing.  Please DO NOT use if you have an allergy to CHG or antibacterial soaps.  If your skin becomes reddened/irritated stop using the CHG and inform your nurse when you arrive at Short Stay.  Do not shave (including legs and underarms) for at least 48 hours prior to the first CHG shower.  You may shave your face.  Please follow these instructions carefully:   1.  Shower with CHG Soap the night before surgery and the   morning of Surgery.  2.  If you choose to wash your hair, wash your hair first as usual with your   normal shampoo.  3.  After you  shampoo, rinse your hair and body thoroughly to remove the  Shampoo.  4.  Use CHG as you would any other liquid soap.  You can apply chg directly  to the skin and wash gently with scrungie or a clean washcloth.  5.  Apply the CHG Soap to your body ONLY FROM THE NECK DOWN.    Do not use on open wounds or open sores.  Avoid contact with your eyes,   ears, mouth and genitals (private parts).  Wash genitals (private parts)   with your normal soap.  6.  Wash thoroughly, paying special attention to the area where your surgery   will be performed.  7.  Thoroughly rinse your body with warm water from the neck down.  8.  DO NOT shower/wash with your normal soap after using and rinsing off   the CHG Soap.  9.  Pat yourself dry with a clean towel.            10.  Wear clean pajamas.            11.  Place clean sheets on your bed the night of your first shower and do not   sleep with pets.  Day of Surgery  Do not apply any lotions/deoderants the morning of surgery.  Please wear clean clothes to the hospital/surgery center.     Please read over the following fact sheets that you were given: Pain Booklet, Coughing and Deep Breathing and Surgical Site Infection Prevention

## 2014-06-13 ENCOUNTER — Encounter (HOSPITAL_COMMUNITY): Payer: Self-pay

## 2014-06-13 NOTE — Progress Notes (Signed)
Anesthesia Chart Review:  Patient is a 80 year old female scheduled for left pars plana vitrectomy, membrane peel, laser, possible serum patch, gas injection on 06/14/14 by Dr. Zigmund Daniel. (Update: Case has been canceled for now due to illness of the patient's caregiver.)  History includes non-smoker, CAD/lateral MI s/p emergent CABG X 5 '09, chronic systolic CHF, mild MR and mild to moderate AS by 04/2013 echo, chronic SDH since 2012 with mixed acute component 10/2013 and stable as of 03/2014 (Dr. Sherwood Gambler; medical management), DM2, HTN, CKD, non-Hodgkin's lymphoma (throat), immune thrombocytopenia (likely immune thrombocytopenia; s/p bone marrow biopsy 02/2013) on Nplate (last on 84/16/60). She resides at [facility]. PCP is Dr. Arlyss Queen.  Hematologist is Dr. Marin Olp. Cardiologist is Dr. Shelva Majestic, last visit 06/19/13 with one year follow-up recommended if no acute cardiac issues/symptoms arose.  EKG on 06/10/14: SB @ 52 bpm, first degree AVB, LAD, LVH with QRS widening and repolarization abnormality.  T wave abnormality in V4-6 appears new since 05-09-14 but only more pronounced when compared to 06/08/11 EKG.  Lead I and aVL appears stable.   Her nuclear perfusion study was done on 05/06/2013 and this revealed normal perfusion without scar or ischemia. Post stress ejection fraction was 60%.   05/06/13 Echo: - Left ventricle: The cavity size was normal. There was moderate concentric hypertrophy. Systolic function was normal. The estimated ejection fraction was in the range of 60% to 65%. Wall motion was normal; there were noregional wall motion abnormalities. Doppler parameters areconsistent with abnormal left ventricular relaxation(grade 1 diastolic dysfunction). The E/e' ratio is >15,suggesting markedly elevated LV fillng pressure. - Aortic valve:  Mildly calcified leaflets. There is mild to moderate aortic stenosis. Peak and mean gradients are 24 mmHg and 15 mmHg, respectively. Calculated  AVA is 1.1-1.2 cm2, based on an LVOT diameter of 1.8 cm. Doppler:  Mild regurgitation.  VTI ratio of LVOT to aortic valve: 0.38. Valve area: 0.96 cm^2(VTI). Indexed valve area: 0.64cm^2/m^2 (VTI). Peak velocity ratio of LVOT to aortic valve: 0.4. Valve area: 1.02cm^2 (Vmax). Indexed valve area: 0.68cm^2/m^2 (Vmax).  Mean gradient: 52m Hg (S). Peak gradient: 249mHg (S). - Mitral valve: Heavily calcified posterior annulus. Moderately calcified leaflets . Mild regurgitation. - Left atrium: LA Volume/ BSA = 28.2 ml/m2 The atrium was mildly dilated. - Right ventricle: The cavity size was normal. Wall thickness was normal. The moderator band was prominent.Systolic function was normal. - Tricuspid valve: Mild regurgitation. - Inferior vena cava: The vessel was normal in size; the respirophasic diameter changes were in the normal range (= 50%); findings are consistent with normal central venous pressure.  Head CT 03/22/14: IMPRESSION: Large left convexity extra-axial complex collection with maximal thickness of 2.9 cm causing mass effect upon the left lateral ventricle with 6.9 mm of midline shift to the right appears similar to prior exam consistent with chronic subdural hematoma. Very small chronic right-sided subdural collection/dural thickening stable. No new intracranial hemorrhage is detected.  Preoperative labs noted.  BUN/Cr 31/1.55 which appears within her baseline when compared to last since 11/2012. H/H 12.7/36.6.  PLT count 57K (previously 47K 06/09/14--received Nplate; 202K 11.5/15, 111K [id number] 04/26/14). Dr. MaZigmund Danielas ordered a repeat PLT count for the morning of surgery.  Patient has multiple co-morbidities, but with close follow-up of her thrombocytopenia by HEM-ONC and has had follow-up of her CAD/valvular disease and her chronic SDH within the past year. Dr. MaZigmund Daniels aware of her recent PLT count--hopefully with Nplate last week she will have some improvement.  Her last head  CT in 03/2014 was felt stable.  Her last stress and echo were just over a year ago.  I reviewed above with anesthesiologist Dr. Tobias Alexander. At this point, it is not clear when she will be rescheduled, but if there are no acute changes or new CV/CHF symptoms and Dr. Zigmund Daniel feels her labs are acceptable for the planned procedure then it is anticipated that she can proceed as planned.      [facility] Short Stay Center/Anesthesiology Phone [phone] 06/13/2014 12:51 PM

## 2014-06-14 ENCOUNTER — Ambulatory Visit (HOSPITAL_COMMUNITY): Admission: RE | Admit: 2014-06-14 | Payer: Medicare Other | Source: Ambulatory Visit | Admitting: Ophthalmology

## 2014-06-14 ENCOUNTER — Encounter (INDEPENDENT_AMBULATORY_CARE_PROVIDER_SITE_OTHER): Payer: Medicare Other | Admitting: Ophthalmology

## 2014-06-14 ENCOUNTER — Encounter (HOSPITAL_COMMUNITY): Admission: RE | Payer: Self-pay | Source: Ambulatory Visit

## 2014-06-14 SURGERY — PARS PLANA VITRECTOMY WITH 25 GAUGE
Anesthesia: General | Laterality: Left

## 2014-06-21 ENCOUNTER — Encounter (INDEPENDENT_AMBULATORY_CARE_PROVIDER_SITE_OTHER): Payer: Medicare Other | Admitting: Ophthalmology

## 2014-06-22 ENCOUNTER — Telehealth: Payer: Self-pay | Admitting: Hematology & Oncology

## 2014-06-22 NOTE — Telephone Encounter (Signed)
Niece moved 12-4 to 12-8 due to transportation issues. I left RN vm to let MD know

## 2014-06-24 ENCOUNTER — Ambulatory Visit: Payer: Medicare Other | Admitting: Hematology & Oncology

## 2014-06-24 ENCOUNTER — Other Ambulatory Visit: Payer: Medicare Other | Admitting: Lab

## 2014-06-28 ENCOUNTER — Other Ambulatory Visit (HOSPITAL_BASED_OUTPATIENT_CLINIC_OR_DEPARTMENT_OTHER): Payer: Medicare Other | Admitting: Lab

## 2014-06-28 ENCOUNTER — Encounter: Payer: Self-pay | Admitting: Hematology & Oncology

## 2014-06-28 ENCOUNTER — Ambulatory Visit (HOSPITAL_BASED_OUTPATIENT_CLINIC_OR_DEPARTMENT_OTHER): Payer: Medicare Other | Admitting: Hematology & Oncology

## 2014-06-28 DIAGNOSIS — D696 Thrombocytopenia, unspecified: Secondary | ICD-10-CM

## 2014-06-28 DIAGNOSIS — D693 Immune thrombocytopenic purpura: Secondary | ICD-10-CM

## 2014-06-28 LAB — CBC WITH DIFFERENTIAL (CANCER CENTER ONLY)
BASO#: 0 10*3/uL (ref 0.0–0.2)
BASO%: 0.5 % (ref 0.0–2.0)
EOS%: 7.1 % — ABNORMAL HIGH (ref 0.0–7.0)
Eosinophils Absolute: 0.6 10*3/uL — ABNORMAL HIGH (ref 0.0–0.5)
HEMATOCRIT: 37.2 % (ref 34.8–46.6)
HEMOGLOBIN: 12.9 g/dL (ref 11.6–15.9)
LYMPH#: 1.3 10*3/uL (ref 0.9–3.3)
LYMPH%: 16.3 % (ref 14.0–48.0)
MCH: 33.7 pg (ref 26.0–34.0)
MCHC: 34.7 g/dL (ref 32.0–36.0)
MCV: 97 fL (ref 81–101)
MONO#: 0.6 10*3/uL (ref 0.1–0.9)
MONO%: 7.8 % (ref 0.0–13.0)
NEUT#: 5.4 10*3/uL (ref 1.5–6.5)
NEUT%: 68.3 % (ref 39.6–80.0)
Platelets: 167 10*3/uL (ref 145–400)
RBC: 3.83 10*6/uL (ref 3.70–5.32)
RDW: 12.2 % (ref 11.1–15.7)
WBC: 7.9 10*3/uL (ref 3.9–10.0)

## 2014-06-28 NOTE — Progress Notes (Signed)
[place]  [phone] Fax:(336) [phone]  ID: Lisa James OB: [birth year] MR#: [id number]#:[id number] Patient Care Team: Darlyne Russian, MD as PCP - General (Family Medicine)  DIAGNOSIS: Thrombocytopenia-likely immune thrombocytopenia  INTERVAL HISTORY: Lisa James is here today for a follow-up. She is doing well and has had no problems since her last visit here with [place].  Today her platelets are 167. She has had no bleeding.  Her appetite is good and she is drinking plenty of fluids. She denies fatigue, fever, chills, cough, rash, headache, dizziness, SOB, chest pain, palpitations, abdominal pain, constipation, diarrhea, blood in urine or stool. She has had no bone pain.  She denies swelling, tenderness, numbness or tingling in her extremities. Her appetite is good and she is drinking lots of fluids.  She still likes living in the nursing home. She walks every day and enjoys herself there.  CURRENT TREATMENT: Nplate as indicated  REVIEW OF SYSTEMS: All other 10 point review of systems is negative except for those issues mentioned above.   PAST MEDICAL HISTORY: Past Medical History  Diagnosis Date  . Acute MI, lateral wall   . Chronic systolic heart failure   . Ischemic heart disease   . Mitral regurgitation   . HTN (hypertension)   . Non Hodgkin's lymphoma     of the throat  . Immune thrombocytopenic purpura 01/21/2013  . Coronary artery disease     sees Dr Claiborne Billings every 6 months  . DM (diabetes mellitus)     type 2 ; diet controlled  . Thrombocytopenia     sees Dr Marin Olp every 2 weeks ;receives Nplate prn  . Subdural hematoma, post-traumatic 2015    sees Dr Sherwood Gambler every 6 months  . Aortic stenosis   . Chronic kidney disease    PAST SURGICAL HISTORY: Past Surgical History  Procedure Laterality Date  . Coronary artery bypass graft  11/20/2007    x5  . Bone marrow biopsy  11/22/2002    left posterior iliac crest bone marrow biopsy and  aspirate  . Submental lymph node excisional biopsy  10/22/2002  . Eye surgery    . Cataract extraction w/ intraocular lens  implant, bilateral Bilateral    FAMILY HISTORY Family History  Problem Relation Age of Onset  . Heart attack Father   . Diabetes Father    GYNECOLOGIC HISTORY:  No LMP recorded (lmp unknown). Patient is postmenopausal.   SOCIAL HISTORY:  History   Social History  . Marital Status: Widowed    Spouse Name: N/A    Number of Children: N/A  . Years of Education: N/A   Occupational History  . Not on file.   Social History Main Topics  . Smoking status: Never Smoker   . Smokeless tobacco: Never Used     Comment: never used tobacco.  . Alcohol Use: No  . Drug Use: No  . Sexual Activity: No   Other Topics Concern  . Not on file   Social History Narrative   ADVANCED DIRECTIVES: <no information>  HEALTH MAINTENANCE: History  Substance Use Topics  . Smoking status: Never Smoker   . Smokeless tobacco: Never Used     Comment: never used tobacco.  . Alcohol Use: No   Colonoscopy: PAP: Bone density: Lipid panel:  Allergies  Allergen Reactions  . Ace Inhibitors Cough   Current Outpatient Prescriptions  Medication Sig Dispense Refill  . atorvastatin (LIPITOR) 20 MG tablet TAKE 1 TABLET (20 MG TOTAL) BY MOUTH DAILY.  30 tablet 9  . Calcium Carbonate-Vitamin D (CALCIUM-VITAMIN D) 500-200 MG-UNIT per tablet Take 1 tablet by mouth daily.     . carvedilol (COREG) 6.25 MG tablet TAKE 1 TABLET (6.25 MG TOTAL) BY MOUTH 2 (TWO) TIMES DAILY WITH A MEAL. 60 tablet 9  . Cholecalciferol (VITAMIN D) 1000 UNITS capsule Take 1,000 Units by mouth at bedtime.     . furosemide (LASIX) 20 MG tablet TAKE 1 TABLET (20 MG TOTAL) BY MOUTH DAILY. 30 tablet 6  . irbesartan (AVAPRO) 300 MG tablet Take 1 tablet (300 mg total) by mouth daily. <please make appointment for refills> 30 tablet 1  . losartan (COZAAR) 50 MG tablet Take 50 mg by mouth daily.    . Multiple  Vitamins-Minerals (PRESERVISION AREDS PO) Take 1 tablet by mouth daily.    . mupirocin ointment (BACTROBAN) 2 % Apply to left ear lobe 3 times a day (Patient taking differently: Apply 1 application topically 3 (three) times daily. Apply to left ear lobe 3 times a day for skin cancer) 22 g 0  . potassium chloride SA (K-DUR,KLOR-CON) 20 MEQ tablet Take 20 mEq by mouth every other day.    . romiPLOStim (NPLATE) 250 MCG injection Inject 1 mcg/kg into the skin as needed (every 2-3 weeks for low platelets). Given @ Oncology    . sertraline (ZOLOFT) 25 MG tablet TAKE 1 TABLET (25 MG TOTAL) BY MOUTH DAILY. 30 tablet 4  . spironolactone (ALDACTONE) 25 MG tablet Take 25 mg by mouth daily.    Marland Kitchen UNKNOWN TO PATIENT 06-28-14  Printed medication list given to patient, to give to her niece. Asked niece to please call MD office so we can verify  if medications are correct.     No current facility-administered medications for this visit.   Facility-Administered Medications Ordered in Other Visits  Medication Dose Route Frequency Provider Last Rate Last Dose  . diphenhydrAMINE (BENADRYL) tablet 25 mg  25 mg Oral Q6H PRN Volanda Napoleon, MD   25 mg at 01/28/13 0913   OBJECTIVE: Filed Vitals:   06/28/14 1131  BP: 124/55  Pulse: 59  Temp: 98.1 F (36.7 C)  Resp: 16   Body mass index is 24.02 kg/(m^2). ECOG FS:0 - Asymptomatic Ocular: Sclerae unicteric, pupils equal, round and reactive to light Ear-nose-throat: Oropharynx clear, dentition fair Lymphatic: No cervical or supraclavicular adenopathy Lungs no rales or rhonchi, good excursion bilaterally Heart regular rate and rhythm, no murmur appreciated Abd soft, nontender, positive bowel sounds MSK no focal spinal tenderness, no joint edema Neuro: non-focal, well-oriented, appropriate affect Breasts: Deferred  LAB RESULTS: CMP     Component Value Date/Time   NA 137 06/10/2014 1536   NA 142 01/21/2013 1157   K 4.6 06/10/2014 1536   K 5.3* 01/21/2013  1157   CL 98 06/10/2014 1536   CL 103 01/21/2013 1157   CO2 26 06/10/2014 1536   CO2 27 01/21/2013 1157   GLUCOSE 132* 06/10/2014 1536   GLUCOSE 123* 01/21/2013 1157   BUN 31* 06/10/2014 1536   BUN 32* 01/21/2013 1157   CREATININE 1.55* 06/10/2014 1536   CREATININE 1.35* 03/26/2014 0956   CALCIUM 9.9 06/10/2014 1536   CALCIUM 9.8 01/21/2013 1157   PROT 6.2 03/26/2014 0956   PROT 6.4 01/21/2013 1157   ALBUMIN 4.4 03/26/2014 0956   AST 18 03/26/2014 0956   AST 24 01/21/2013 1157   ALT 13 03/26/2014 0956   ALT 18 01/21/2013 1157   ALKPHOS 95 03/26/2014 0956  ALKPHOS 67 01/21/2013 1157   BILITOT 1.8* 03/26/2014 0956   BILITOT 1.8* 03/26/2014 0956   BILITOT 1.10 01/21/2013 1157   GFRNONAA 30* 06/10/2014 1536   GFRAA 34* 06/10/2014 1536   No results found for: SPEP Lab Results  Component Value Date   WBC 7.9 06/28/2014   NEUTROABS 5.4 06/28/2014   HGB 12.9 06/28/2014   HCT 37.2 06/28/2014   MCV 97 06/28/2014   PLT 167 06/28/2014   No results found for: LABCA2 No components found for: TMMIT947 No results for input(s): INR in the last 168 hours.  STUDIES: None  ASSESSMENT/PLAN: Lisa James is an 80 year old female with thrombocytopenia. This is likely immune thrombocytopenia. She had a bone marrow biopsy last year. She is doing well and is asymptomatic at this time.  Today her platelets are 167 and she has had no bleeding. She will not need NPlate today.  We will see her again in 1 month for labs and follow-up. She is in agreement and knows to call with any questions or concerns and to go to the ED in the event of an emergency. We can certainly see her sooner if need be.   Eliezer Bottom, NP 06/28/2014 11:49 AM

## 2014-06-30 ENCOUNTER — Other Ambulatory Visit: Payer: Self-pay | Admitting: Emergency Medicine

## 2014-07-07 ENCOUNTER — Other Ambulatory Visit: Payer: Self-pay | Admitting: Neurosurgery

## 2014-07-07 DIAGNOSIS — S065XAA Traumatic subdural hemorrhage with loss of consciousness status unknown, initial encounter: Secondary | ICD-10-CM

## 2014-07-07 DIAGNOSIS — S065X9A Traumatic subdural hemorrhage with loss of consciousness of unspecified duration, initial encounter: Secondary | ICD-10-CM

## 2014-07-19 ENCOUNTER — Ambulatory Visit
Admission: RE | Admit: 2014-07-19 | Discharge: 2014-07-19 | Disposition: A | Discharge status: Home / Self Care | Payer: Medicare Other | Source: Ambulatory Visit | Attending: Neurosurgery | Admitting: Neurosurgery

## 2014-07-19 DIAGNOSIS — S065X9A Traumatic subdural hemorrhage with loss of consciousness of unspecified duration, initial encounter: Secondary | ICD-10-CM

## 2014-07-19 DIAGNOSIS — S065XAA Traumatic subdural hemorrhage with loss of consciousness status unknown, initial encounter: Secondary | ICD-10-CM

## 2014-07-21 ENCOUNTER — Ambulatory Visit (HOSPITAL_BASED_OUTPATIENT_CLINIC_OR_DEPARTMENT_OTHER): Payer: Medicare Other

## 2014-07-21 ENCOUNTER — Other Ambulatory Visit (HOSPITAL_BASED_OUTPATIENT_CLINIC_OR_DEPARTMENT_OTHER): Payer: Medicare Other | Admitting: Lab

## 2014-07-21 ENCOUNTER — Encounter: Payer: Self-pay | Admitting: Family

## 2014-07-21 ENCOUNTER — Ambulatory Visit (HOSPITAL_BASED_OUTPATIENT_CLINIC_OR_DEPARTMENT_OTHER): Payer: Medicare Other | Admitting: Family

## 2014-07-21 DIAGNOSIS — D696 Thrombocytopenia, unspecified: Secondary | ICD-10-CM

## 2014-07-21 DIAGNOSIS — D693 Immune thrombocytopenic purpura: Secondary | ICD-10-CM

## 2014-07-21 LAB — CBC WITH DIFFERENTIAL (CANCER CENTER ONLY)
BASO#: 0 10*3/uL (ref 0.0–0.2)
BASO%: 0.4 % (ref 0.0–2.0)
EOS%: 7.2 % — ABNORMAL HIGH (ref 0.0–7.0)
Eosinophils Absolute: 0.3 10*3/uL (ref 0.0–0.5)
HCT: 34.9 % (ref 34.8–46.6)
HGB: 12 g/dL (ref 11.6–15.9)
LYMPH#: 0.8 10*3/uL — AB (ref 0.9–3.3)
LYMPH%: 17.7 % (ref 14.0–48.0)
MCH: 33.9 pg (ref 26.0–34.0)
MCHC: 34.4 g/dL (ref 32.0–36.0)
MCV: 99 fL (ref 81–101)
MONO#: 0.4 10*3/uL (ref 0.1–0.9)
MONO%: 8.6 % (ref 0.0–13.0)
NEUT#: 3.1 10*3/uL (ref 1.5–6.5)
NEUT%: 66.1 % (ref 39.6–80.0)
Platelets: 40 10*3/uL — ABNORMAL LOW (ref 145–400)
RBC: 3.54 10*6/uL — ABNORMAL LOW (ref 3.70–5.32)
RDW: 12.8 % (ref 11.1–15.7)
WBC: 4.7 10*3/uL (ref 3.9–10.0)

## 2014-07-21 MED ORDER — ROMIPLOSTIM 250 MCG ~~LOC~~ SOLR
5.0000 ug/kg | Freq: Once | SUBCUTANEOUS | Status: AC
  Administered 2014-07-21: 270 ug via SUBCUTANEOUS
  Filled 2014-07-21: qty 0.54

## 2014-07-21 NOTE — Progress Notes (Signed)
Lisa James  Telephone:(336) [phone] Fax:(336) [phone]  ID: Hilarie Fredrickson OB: [birth year] MR#: [id number]#:[id number] Patient Care Team: Darlyne Russian, MD as PCP - General (Family Medicine)  DIAGNOSIS: Thrombocytopenia-likely immune thrombocytopenia  INTERVAL HISTORY: Ms. Lisa James is here today for a follow-up. She is doing well and has had no problems since her last visit here with [place].  Today her platelets are 40. She has had no bleeding.  Her appetite is good and she is drinking plenty of fluids. She denies fatigue, fever, chills, cough, rash, headache, dizziness, SOB, chest pain, palpitations, abdominal pain, constipation, diarrhea, blood in urine or stool. She has had no bone pain.  She denies swelling, tenderness, numbness or tingling in her extremities. She still likes living in the nursing home. She walks every day and enjoys herself there.  CURRENT TREATMENT: Nplate as indicated  REVIEW OF SYSTEMS: All other 10 point review of systems is negative except for those issues mentioned above.   PAST MEDICAL HISTORY: Past Medical History  Diagnosis Date  . Acute MI, lateral wall   . Chronic systolic heart failure   . Ischemic heart disease   . Mitral regurgitation   . HTN (hypertension)   . Non Hodgkin's lymphoma     of the throat  . Immune thrombocytopenic purpura 01/21/2013  . Coronary artery disease     sees Dr Claiborne Billings every 6 months  . DM (diabetes mellitus)     type 2 ; diet controlled  . Thrombocytopenia     sees Dr Marin Olp every 2 weeks ;receives Nplate prn  . Subdural hematoma, post-traumatic 2015    sees Dr Sherwood Gambler every 6 months  . Aortic stenosis   . Chronic kidney disease    PAST SURGICAL HISTORY: Past Surgical History  Procedure Laterality Date  . Coronary artery bypass graft  11/20/2007    x5  . Bone marrow biopsy  11/22/2002    left posterior iliac crest bone marrow biopsy and aspirate  . Submental lymph node excisional biopsy  10/22/2002  .  Eye surgery    . Cataract extraction w/ intraocular lens  implant, bilateral Bilateral    FAMILY HISTORY Family History  Problem Relation Age of Onset  . Heart attack Father   . Diabetes Father    GYNECOLOGIC HISTORY:  No LMP recorded (lmp unknown). Patient is postmenopausal.   SOCIAL HISTORY:  History   Social History  . Marital Status: Widowed    Spouse Name: N/A    Number of Children: N/A  . Years of Education: N/A   Occupational History  . Not on file.   Social History Main Topics  . Smoking status: Never Smoker   . Smokeless tobacco: Never Used     Comment: never used tobacco.  . Alcohol Use: No  . Drug Use: No  . Sexual Activity: No   Other Topics Concern  . Not on file   Social History Narrative   ADVANCED DIRECTIVES: <no information>  HEALTH MAINTENANCE: History  Substance Use Topics  . Smoking status: Never Smoker   . Smokeless tobacco: Never Used     Comment: never used tobacco.  . Alcohol Use: No   Colonoscopy: PAP: Bone density: Lipid panel:  Allergies  Allergen Reactions  . Ace Inhibitors Cough   Current Outpatient Prescriptions  Medication Sig Dispense Refill  . atorvastatin (LIPITOR) 20 MG tablet TAKE 1 TABLET (20 MG TOTAL) BY MOUTH DAILY. 30 tablet 9  . Calcium Carbonate-Vitamin D (CALCIUM-VITAMIN D) 500-200 MG-UNIT  per tablet Take 1 tablet by mouth daily.     . carvedilol (COREG) 6.25 MG tablet TAKE 1 TABLET (6.25 MG TOTAL) BY MOUTH 2 (TWO) TIMES DAILY WITH A MEAL. 60 tablet 9  . Cholecalciferol (VITAMIN D) 1000 UNITS capsule Take 1,000 Units by mouth at bedtime.     . furosemide (LASIX) 20 MG tablet TAKE 1 TABLET (20 MG TOTAL) BY MOUTH DAILY. 30 tablet 6  . irbesartan (AVAPRO) 300 MG tablet Take 1 tablet (300 mg total) by mouth daily. <please make appointment for refills> 30 tablet 1  . losartan (COZAAR) 50 MG tablet Take 50 mg by mouth daily.    . Multiple Vitamins-Minerals (PRESERVISION AREDS PO) Take 1 tablet by mouth daily.    .  mupirocin ointment (BACTROBAN) 2 % Apply to left ear lobe 3 times a day (Patient taking differently: Apply 1 application topically 3 (three) times daily. Apply to left ear lobe 3 times a day for skin cancer) 22 g 0  . potassium chloride SA (K-DUR,KLOR-CON) 20 MEQ tablet Take 20 mEq by mouth every other day.    . romiPLOStim (NPLATE) 250 MCG injection Inject 1 mcg/kg into the skin as needed (every 2-3 weeks for low platelets). Given @ Oncology    . sertraline (ZOLOFT) 25 MG tablet TAKE 1 TABLET (25 MG TOTAL) BY MOUTH DAILY. 30 tablet 4  . spironolactone (ALDACTONE) 25 MG tablet Take 25 mg by mouth daily.    Marland Kitchen UNKNOWN TO PATIENT 06-28-14  Printed medication list given to patient, to give to her niece. Asked niece to please call MD office so we can verify  if medications are correct.     No current facility-administered medications for this visit.   Facility-Administered Medications Ordered in Other Visits  Medication Dose Route Frequency Provider Last Rate Last Dose  . diphenhydrAMINE (BENADRYL) tablet 25 mg  25 mg Oral Q6H PRN Volanda Napoleon, MD   25 mg at 01/28/13 0913  . romiPLOStim (NPLATE) injection 270 mcg  5 mcg/kg (Order-Specific) Subcutaneous Once Volanda Napoleon, MD       OBJECTIVE: Filed Vitals:   07/21/14 1000  BP: 107/62  Pulse: 71  Temp: 97.9 F (36.6 C)  Resp: 18   Body mass index is 24.22 kg/(m^2). ECOG FS:0 - Asymptomatic Ocular: Sclerae unicteric, pupils equal, round and reactive to light Ear-nose-throat: Oropharynx clear, dentition fair Lymphatic: No cervical or supraclavicular adenopathy Lungs no rales or rhonchi, good excursion bilaterally Heart regular rate and rhythm, no murmur appreciated Abd soft, nontender, positive bowel sounds MSK no focal spinal tenderness, no joint edema Neuro: non-focal, well-oriented, appropriate affect Breasts: Deferred  LAB RESULTS: CMP     Component Value Date/Time   NA 137 06/10/2014 1536   NA 142 01/21/2013 1157   K 4.6  06/10/2014 1536   K 5.3* 01/21/2013 1157   CL 98 06/10/2014 1536   CL 103 01/21/2013 1157   CO2 26 06/10/2014 1536   CO2 27 01/21/2013 1157   GLUCOSE 132* 06/10/2014 1536   GLUCOSE 123* 01/21/2013 1157   BUN 31* 06/10/2014 1536   BUN 32* 01/21/2013 1157   CREATININE 1.55* 06/10/2014 1536   CREATININE 1.35* 03/26/2014 0956   CALCIUM 9.9 06/10/2014 1536   CALCIUM 9.8 01/21/2013 1157   PROT 6.2 03/26/2014 0956   PROT 6.4 01/21/2013 1157   ALBUMIN 4.4 03/26/2014 0956   AST 18 03/26/2014 0956   AST 24 01/21/2013 1157   ALT 13 03/26/2014 0956   ALT 18 01/21/2013  1157   ALKPHOS 95 03/26/2014 0956   ALKPHOS 67 01/21/2013 1157   BILITOT 1.8* 03/26/2014 0956   BILITOT 1.8* 03/26/2014 0956   BILITOT 1.10 01/21/2013 1157   GFRNONAA 30* 06/10/2014 1536   GFRAA 34* 06/10/2014 1536   No results found for: SPEP Lab Results  Component Value Date   WBC 4.7 07/21/2014   NEUTROABS 3.1 07/21/2014   HGB 12.0 07/21/2014   HCT 34.9 07/21/2014   MCV 99 07/21/2014   PLT 40* 07/21/2014   No results found for: LABCA2 No components found for: [id number] No results for input(s): INR in the last 168 hours.  STUDIES: None  ASSESSMENT/PLAN: Ms. Zaborowski is a very pleasant 80 year old female with thrombocytopenia. This is likely immune thrombocytopenia. She had a bone marrow biopsy last year. She is doing well and is asymptomatic at this time.  Today her platelets are 40 and she has had no bleeding.  We will give her an increased dose of NPlate today.  We will see her again in 1 month for labs and follow-up. She is in agreement and knows to call with any questions or concerns and to go to the ED in the event of an emergency. We can certainly see her sooner if need be.   Eliezer Bottom, NP 07/21/2014 10:50 AM

## 2014-07-21 NOTE — Patient Instructions (Signed)
Romiplostim injection What is this medicine? ROMIPLOSTIM (roe mi PLOE stim) helps your body make more platelets. This medicine is used to treat low platelets caused by chronic idiopathic thrombocytopenic purpura (ITP). This medicine may be used for other purposes; ask your health care provider or pharmacist if you have questions. COMMON BRAND NAME(S): Nplate What should I tell my health care provider before I take this medicine? They need to know if you have any of these conditions: -cancer or myelodysplastic syndrome -low blood counts, like low white cell, platelet, or red cell counts -take medicines that treat or prevent blood clots -an unusual or allergic reaction to romiplostim, mannitol, other medicines, foods, dyes, or preservatives -pregnant or trying to get pregnant -breast-feeding How should I use this medicine? This medicine is for injection under the skin. It is given by a health care professional in a hospital or clinic setting. A special MedGuide will be given to you before your injection. Read this information carefully each time. Talk to your pediatrician regarding the use of this medicine in children. Special care may be needed. Overdosage: If you think you have taken too much of this medicine contact a poison control center or emergency room at once. NOTE: This medicine is only for you. Do not share this medicine with others. What if I miss a dose? It is important not to miss your dose. Call your doctor or health care professional if you are unable to keep an appointment. What may interact with this medicine? Interactions are not expected. This list may not describe all possible interactions. Give your health care provider a list of all the medicines, herbs, non-prescription drugs, or dietary supplements you use. Also tell them if you smoke, drink alcohol, or use illegal drugs. Some items may interact with your medicine. What should I watch for while using this  medicine? Your condition will be monitored carefully while you are receiving this medicine. Visit your prescriber or health care professional for regular checks on your progress and for the needed blood tests. It is important to keep all appointments. What side effects may I notice from receiving this medicine? Side effects that you should report to your doctor or health care professional as soon as possible: -allergic reactions like skin rash, itching or hives, swelling of the face, lips, or tongue -shortness of breath, chest pain, swelling in a leg -unusual bleeding or bruising Side effects that usually do not require medical attention (report to your doctor or health care professional if they continue or are bothersome): -dizziness -headache -muscle aches -pain in arms and legs -stomach pain -trouble sleeping This list may not describe all possible side effects. Call your doctor for medical advice about side effects. You may report side effects to FDA at 1-800-FDA-1088. Where should I keep my medicine? This drug is given in a hospital or clinic and will not be stored at home. NOTE: This sheet is a summary. It may not cover all possible information. If you have questions about this medicine, talk to your doctor, pharmacist, or health care provider.  2015, Elsevier/Gold Standard. (2008-03-07 15:13:04)  

## 2014-07-26 ENCOUNTER — Other Ambulatory Visit: Payer: Self-pay | Admitting: Emergency Medicine

## 2014-07-26 ENCOUNTER — Other Ambulatory Visit: Payer: Self-pay | Admitting: Cardiovascular Disease

## 2014-07-27 NOTE — Telephone Encounter (Signed)
Rx(s) sent to pharmacy electronically.  

## 2014-07-29 ENCOUNTER — Ambulatory Visit: Payer: Medicare Other | Admitting: Family

## 2014-07-29 ENCOUNTER — Other Ambulatory Visit: Payer: Medicare Other | Admitting: Lab

## 2014-08-02 ENCOUNTER — Ambulatory Visit (INDEPENDENT_AMBULATORY_CARE_PROVIDER_SITE_OTHER): Payer: 59 | Admitting: Cardiovascular Disease

## 2014-08-02 ENCOUNTER — Encounter: Payer: Self-pay | Admitting: Cardiovascular Disease

## 2014-08-02 VITALS — BP 121/61 | HR 71 | Ht 60.0 in | Wt 119.8 lb

## 2014-08-02 DIAGNOSIS — I2510 Atherosclerotic heart disease of native coronary artery without angina pectoris: Secondary | ICD-10-CM

## 2014-08-02 DIAGNOSIS — N183 Chronic kidney disease, stage 3 unspecified: Secondary | ICD-10-CM

## 2014-08-02 DIAGNOSIS — I350 Nonrheumatic aortic (valve) stenosis: Secondary | ICD-10-CM

## 2014-08-02 DIAGNOSIS — I252 Old myocardial infarction: Secondary | ICD-10-CM

## 2014-08-02 DIAGNOSIS — I2583 Coronary atherosclerosis due to lipid rich plaque: Secondary | ICD-10-CM

## 2014-08-02 DIAGNOSIS — I10 Essential (primary) hypertension: Secondary | ICD-10-CM

## 2014-08-02 DIAGNOSIS — Z79899 Other long term (current) drug therapy: Secondary | ICD-10-CM

## 2014-08-02 NOTE — Progress Notes (Signed)
Patient ID: Lisa James, female   DOB: 26-Jul-1927, 81 y.o.   MRN: [id number]     HPI: [name] is an 81 year old female who presents to the office for followup cardiology evaluation. Remotely, the patient had been seen by Dr. Jenell Milliner. She established cardiology care with me in 2014.   [name] has a history of hypertension, hyperlipidemia, and CAD. In March 2009 she underwent emergent CABG surgery x5 by Dr. Servando Snare following myocardial infarction. An echo Doppler study in 2010 showed an ejection fraction in the 40-45% range and there was evidence for mild aortic stenosis. Additional problems also include a subdural hematoma, a history of immune thrombocytopenia and most recently has been on Nplate therapy  followed by Dr. Lutricia Feil. There is also mention in the chart of an occlusion and stenosis of carotid artery without mention of cerebral infarction. She last saw Dr. Verl Blalock several years ago.  [name] remains active. [facility]. She remains active. She denies recent chest pain. At times and some mild shortness of breath. She is unaware of palpitations.  A nuclear perfusion study on 05/06/2013  revealed normal perfusion without scar or ischemia. Post stress ejection fraction was 60%. An echo Doppler study revealed an ejection fraction of 60-65%. She had mild grade 1 diastolic dysfunction with moderate left ventricular hypertrophy. She had a heavily calcified posterior mitral valve annulus. There was mild aortic regurgitation. She had mild tricuspid regurgitation. Her aortic valve area was 1.1-1.2 cm and she was felt to have mild to moderate aortic stenosis. Her mean gradient was 13 and peak gradient was 23 mm. She denies recent chest pain or shortness of breath or heart failure symptoms.  Since I last saw her, she denies PND, orthopnea.  She is unaware of palpitations.  There is no presyncope.  She denies chest pain.  Past Medical History  Diagnosis  Date  . Acute MI, lateral wall   . Chronic systolic heart failure   . Ischemic heart disease   . Mitral regurgitation   . HTN (hypertension)   . Non Hodgkin's lymphoma     of the throat  . Immune thrombocytopenic purpura 01/21/2013  . Coronary artery disease     sees Dr Claiborne Billings every 6 months  . DM (diabetes mellitus)     type 2 ; diet controlled  . Thrombocytopenia     sees Dr Marin Olp every 2 weeks ;receives Nplate prn  . Subdural hematoma, post-traumatic 2015    sees Dr Sherwood Gambler every 6 months  . Aortic stenosis   . Chronic kidney disease     Past Surgical History  Procedure Laterality Date  . Coronary artery bypass graft  11/20/2007    x5  . Bone marrow biopsy  11/22/2002    left posterior iliac crest bone marrow biopsy and aspirate  . Submental lymph node excisional biopsy  10/22/2002  . Eye surgery    . Cataract extraction w/ intraocular lens  implant, bilateral Bilateral     Allergies  Allergen Reactions  . Ace Inhibitors Cough    Current Outpatient Prescriptions  Medication Sig Dispense Refill  . atorvastatin (LIPITOR) 20 MG tablet TAKE 1 TABLET (20 MG TOTAL) BY MOUTH DAILY. 30 tablet 9  . Calcium Carbonate-Vitamin D (CALCIUM-VITAMIN D) 500-200 MG-UNIT per tablet Take 1 tablet by mouth daily.     . carvedilol (COREG) 6.25 MG tablet TAKE 1 TABLET (6.25 MG TOTAL) BY MOUTH 2 (TWO) TIMES DAILY WITH A MEAL. 60 tablet  9  . Cholecalciferol (VITAMIN D) 1000 UNITS capsule Take 1,000 Units by mouth at bedtime.     . furosemide (LASIX) 20 MG tablet TAKE 1 TABLET (20 MG TOTAL) BY MOUTH DAILY. 30 tablet 6  . irbesartan (AVAPRO) 300 MG tablet Take 1 tablet (300 mg total) by mouth daily. MUST KEEP APPOINTMENT 08/02/2014 WITH DR Claiborne Billings FOR FUTURE REFILLS. 30 tablet 0  . losartan (COZAAR) 50 MG tablet Take 50 mg by mouth daily.    . Multiple Vitamins-Minerals (PRESERVISION AREDS PO) Take 1 tablet by mouth daily.    . mupirocin ointment (BACTROBAN) 2 % Apply to left ear lobe 3 times a day  (Patient taking differently: Apply 1 application topically 3 (three) times daily. Apply to left ear lobe 3 times a day for skin cancer) 22 g 0  . potassium chloride SA (K-DUR,KLOR-CON) 20 MEQ tablet Take 20 mEq by mouth every other day.    . romiPLOStim (NPLATE) 250 MCG injection Inject 1 mcg/kg into the skin as needed (every 2-3 weeks for low platelets). Given @ Oncology    . sertraline (ZOLOFT) 25 MG tablet TAKE 1 TABLET (25 MG TOTAL) BY MOUTH DAILY. 30 tablet 4  . spironolactone (ALDACTONE) 25 MG tablet Take 25 mg by mouth daily.    Marland Kitchen UNKNOWN TO PATIENT 06-28-14  Printed medication list given to patient, to give to her niece. Asked niece to please call MD office so we can verify  if medications are correct.     No current facility-administered medications for this visit.   Facility-Administered Medications Ordered in Other Visits  Medication Dose Route Frequency Provider Last Rate Last Dose  . diphenhydrAMINE (BENADRYL) tablet 25 mg  25 mg Oral Q6H PRN Volanda Napoleon, MD   25 mg at 01/28/13 5956    Socially she is a widow for 11 years. She has 2 children 4 grandchildren and 3 great-grandchildren. 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 Remotely she used to use snuff but quit this 20 years ago. There is no alcohol use. He does walk  Family History  Problem Relation Age of Onset  . Heart attack Father   . Diabetes Father    Of note, her mother lived until age 51. Father died around 64 years of age.   ROS General: Negative; No fevers, chills, or night sweats;  HEENT: Negative; No changes in vision or hearing, sinus congestion, difficulty swallowing Pulmonary: Negative; No cough, wheezing, shortness of breath, hemoptysis Cardiovascular: Negative; No chest pain, presyncope, syncope, palpitations GI: Negative; No nausea, vomiting, diarrhea, or abdominal pain GU: Negative; No dysuria, hematuria, or difficulty voiding Musculoskeletal: Negative; no myalgias, joint pain,  or weakness Hematologic/Oncology: Negative; no easy bruising, bleeding Endocrine: Negative; no heat/cold intolerance; no diabetes Neuro: Negative; no changes in balance, headaches Skin: Negative; No rashes or skin lesions Psychiatric: Negative; No behavioral problems, depression Sleep: Negative; No snoring, daytime sleepiness, hypersomnolence, bruxism, restless legs, hypnogognic hallucinations, no cataplexy Other comprehensive 14 point system review is negative.  PE BP 121/61 mmHg  Pulse 71  Ht 5' (1.524 m)  Wt 119 lb 12.8 oz (54.341 kg)  BMI 23.40 kg/m2  LMP  (LMP Unknown) General: Alert, oriented, no distress.  Skin: normal turgor, no rashes  HEENT: Normocephalic, atraumatic. Pupils round and reactive; sclera anicteric; Fundi mild arteriolar narrowing. Nose without nasal septal hypertrophy Mouth/Parynx benign; Mallinpatti scale Neck: No JVD, no carotid briuts Mildly kyphotic Lungs: clear to ausculatation and percussion; no wheezing or rales Heart: RRR, s1  s2 normal 2/6 systolic murmur in the aortic region. Mild transmission to carotids Abdomen: soft, nontender; no hepatosplenomehaly, BS+; abdominal aorta nontender and not dilated by palpation. Pulses 2+ Extremities: no clubbinbg cyanosis or edema, Homan's sign negative  Neurologic: grossly nonfocal Psychologic: normal affect and mood  ECG (independently read by me): Sinus rhythm with sinus arrhythmia and PACs.  QTc interval 454 ms.   LABS:  BMET  BMP Latest Ref Rng 06/10/2014 03/26/2014 10/28/2013  Glucose 70 - 99 mg/dL 132(H) 263(H) 135(H)  BUN 6 - 23 mg/dL 31(H) 25(H) 27(H)  Creatinine 0.50 - 1.10 mg/dL 1.55(H) 1.35(H) 1.32(H)  Sodium 137 - 147 mEq/L 137 136 138  Potassium 3.7 - 5.3 mEq/L 4.6 3.9 4.7  Chloride 96 - 112 mEq/L 98 102 102  CO2 19 - 32 mEq/L _0 Calcium 8.4 - 10.5 mg/dL 9.9 9.3 9.5     Hepatic Function Panel   Hepatic Function Latest Ref Rng 03/26/2014 03/26/2014 10/28/2013  Total Protein 6.0 - 8.3  g/dL - 6.2 6.3  Albumin 3.5 - 5.2 g/dL - 4.4 4.3  AST 0 - 37 U/L - 18 17  ALT 0 - 35 U/L - 13 12  Alk Phosphatase 39 - 117 U/L - 95 93  Total Bilirubin 0.2 - 1.2 mg/dL 1.8(H) 1.8(H) 1.3(H)  Bilirubin, Direct 0.0 - 0.3 mg/dL 0.4(H) - -     CBC  CBC Latest Ref Rng 07/21/2014 06/28/2014 06/10/2014  WBC 3.9 - 10.0 10e3/uL 4.7 7.9 8.3  Hemoglobin 11.6 - 15.9 g/dL 12.0 12.9 12.7  Hematocrit 34.8 - 46.6 % 34.9 37.2 36.6  Platelets 145 - 400 10e3/uL 40(L) 167 57(L)     BNP No results found for: PROBNP  Lipid Panel     Component Value Date/Time   CHOL 127 05/06/2013 0852   TRIG 166* 05/06/2013 0852   HDL 29* 05/06/2013 0852   CHOLHDL 4.4 05/06/2013 0852   VLDL 33 05/06/2013 0852   LDLCALC 65 05/06/2013 0852     RADIOLOGY: No results found.   ASSESSMENT AND PLAN: Ms. Marton is an 81 year old female with a history of hypertension, mild renal insufficiency, who  suffered a myocardial infarction in 2009 and underwent CABG surgery x5 by Dr. Servando Snare. Additional problems have included the development of chronic immune thrombocytopenia for which she currently is under therapy with Dr. Marin Olp. There is a history of a subdural hematoma.  Her most recent nuclear perfusion scan in 2014 continues to show normal perfusion without scar or ischemia. Her echo Doppler study shows moderate LVH with at least mild-to-moderate aortic valve stenosis. There was mild aortic insufficiency and significant mitral and aortic calcification with mild MR and mild TR.  She has renal insufficiency and with her most recent creatinine.  She is now approaching stage IV chronic kidney disease.  She's not having any anginal symptoms.  There is no presyncope, PND, orthopnea, and she remains asymptomatic with reference to her aortic valve disease.  She will be seeing Dr. Marin Olp for follow-up evaluation next month. She will undergo a follow-up echo Doppler study to reassess her valvular heart disease in April 2016.  At that  time.  Repeat lab work will also be obtained.  I will see her in 3 months for reevaluation.   Troy Sine, MD, [facility] 08/02/2014 12:11 PM

## 2014-08-02 NOTE — Patient Instructions (Signed)
Your physician has requested that you have an echocardiogram. Echocardiography is a painless test that uses sound waves to create images of your heart. It provides your doctor with information about the size and shape of your heart and how well your heart's chambers and valves are working. This procedure takes approximately one hour. There are no restrictions for this procedure. This will be scheduled in April 2016.  Your physician has recommended you make the following change in your medication: If you are taking the Losartan STOP. Continue with the irbesartan.  Your physician recommends that you return for lab work fasting.  Your physician recommends that you schedule a follow-up appointment in: 3 months

## 2014-08-04 ENCOUNTER — Encounter: Payer: Self-pay | Admitting: Cardiovascular Disease

## 2014-08-04 ENCOUNTER — Telehealth: Payer: Self-pay | Admitting: Cardiovascular Disease

## 2014-08-04 NOTE — Telephone Encounter (Signed)
Appoint[employer]ate rep making sure pt could get labs same day of echo (08/10/14). These were ordered at her recent appt w/ Dr. Claiborne Billings. Informed her this should not be a problem.

## 2014-08-04 NOTE — Telephone Encounter (Signed)
Please call,she has a question about her medicine. She saw Dr Claiborne Billings yesterday.

## 2014-08-04 NOTE — Telephone Encounter (Signed)
Clarified pt no longer taking losartan, just wanted to make sure we knew. Told her that our records were updated w/ this info at last OV.

## 2014-08-10 ENCOUNTER — Inpatient Hospital Stay (HOSPITAL_COMMUNITY): Admission: RE | Admit: 2014-08-10 | Payer: 59 | Source: Ambulatory Visit

## 2014-08-16 ENCOUNTER — Telehealth (HOSPITAL_COMMUNITY): Payer: Self-pay | Admitting: *Deleted

## 2014-08-18 ENCOUNTER — Other Ambulatory Visit (HOSPITAL_BASED_OUTPATIENT_CLINIC_OR_DEPARTMENT_OTHER): Payer: 59 | Admitting: Lab

## 2014-08-18 ENCOUNTER — Encounter: Payer: Self-pay | Admitting: Family

## 2014-08-18 ENCOUNTER — Ambulatory Visit (HOSPITAL_BASED_OUTPATIENT_CLINIC_OR_DEPARTMENT_OTHER): Payer: 59 | Admitting: Family

## 2014-08-18 ENCOUNTER — Other Ambulatory Visit: Payer: Self-pay | Admitting: Physician Assistant

## 2014-08-18 ENCOUNTER — Ambulatory Visit (HOSPITAL_BASED_OUTPATIENT_CLINIC_OR_DEPARTMENT_OTHER): Payer: 59

## 2014-08-18 DIAGNOSIS — D696 Thrombocytopenia, unspecified: Secondary | ICD-10-CM

## 2014-08-18 DIAGNOSIS — D693 Immune thrombocytopenic purpura: Secondary | ICD-10-CM

## 2014-08-18 LAB — CBC WITH DIFFERENTIAL (CANCER CENTER ONLY)
BASO#: 0.1 10*3/uL (ref 0.0–0.2)
BASO%: 0.7 % (ref 0.0–2.0)
EOS ABS: 0.5 10*3/uL (ref 0.0–0.5)
EOS%: 7 % (ref 0.0–7.0)
HEMATOCRIT: 37.9 % (ref 34.8–46.6)
HGB: 13 g/dL (ref 11.6–15.9)
LYMPH#: 1.2 10*3/uL (ref 0.9–3.3)
LYMPH%: 16.2 % (ref 14.0–48.0)
MCH: 33.9 pg (ref 26.0–34.0)
MCHC: 34.3 g/dL (ref 32.0–36.0)
MCV: 99 fL (ref 81–101)
MONO#: 0.7 10*3/uL (ref 0.1–0.9)
MONO%: 10.1 % (ref 0.0–13.0)
NEUT#: 4.7 10*3/uL (ref 1.5–6.5)
NEUT%: 66 % (ref 39.6–80.0)
Platelets: 47 10*3/uL — ABNORMAL LOW (ref 145–400)
RBC: 3.84 10*6/uL (ref 3.70–5.32)
RDW: 12.4 % (ref 11.1–15.7)
WBC: 7.2 10*3/uL (ref 3.9–10.0)

## 2014-08-18 MED ORDER — ROMIPLOSTIM 250 MCG ~~LOC~~ SOLR
5.0000 ug/kg | Freq: Once | SUBCUTANEOUS | Status: AC
  Administered 2014-08-18: 270 ug via SUBCUTANEOUS
  Filled 2014-08-18: qty 0.54

## 2014-08-18 MED ORDER — ROMIPLOSTIM 250 MCG ~~LOC~~ SOLR: 2.0000 ug/kg | Freq: Once | SUBCUTANEOUS | Status: DC

## 2014-08-18 NOTE — Patient Instructions (Signed)
Romiplostim injection What is this medicine? ROMIPLOSTIM (roe mi PLOE stim) helps your body make more platelets. This medicine is used to treat low platelets caused by chronic idiopathic thrombocytopenic purpura (ITP). This medicine may be used for other purposes; ask your health care provider or pharmacist if you have questions. COMMON BRAND NAME(S): Nplate What should I tell my health care provider before I take this medicine? They need to know if you have any of these conditions: -cancer or myelodysplastic syndrome -low blood counts, like low white cell, platelet, or red cell counts -take medicines that treat or prevent blood clots -an unusual or allergic reaction to romiplostim, mannitol, other medicines, foods, dyes, or preservatives -pregnant or trying to get pregnant -breast-feeding How should I use this medicine? This medicine is for injection under the skin. It is given by a health care professional in a hospital or clinic setting. A special MedGuide will be given to you before your injection. Read this information carefully each time. Talk to your pediatrician regarding the use of this medicine in children. Special care may be needed. Overdosage: If you think you have taken too much of this medicine contact a poison control center or emergency room at once. NOTE: This medicine is only for you. Do not share this medicine with others. What if I miss a dose? It is important not to miss your dose. Call your doctor or health care professional if you are unable to keep an appointment. What may interact with this medicine? Interactions are not expected. This list may not describe all possible interactions. Give your health care provider a list of all the medicines, herbs, non-prescription drugs, or dietary supplements you use. Also tell them if you smoke, drink alcohol, or use illegal drugs. Some items may interact with your medicine. What should I watch for while using this  medicine? Your condition will be monitored carefully while you are receiving this medicine. Visit your prescriber or health care professional for regular checks on your progress and for the needed blood tests. It is important to keep all appointments. What side effects may I notice from receiving this medicine? Side effects that you should report to your doctor or health care professional as soon as possible: -allergic reactions like skin rash, itching or hives, swelling of the face, lips, or tongue -shortness of breath, chest pain, swelling in a leg -unusual bleeding or bruising Side effects that usually do not require medical attention (report to your doctor or health care professional if they continue or are bothersome): -dizziness -headache -muscle aches -pain in arms and legs -stomach pain -trouble sleeping This list may not describe all possible side effects. Call your doctor for medical advice about side effects. You may report side effects to FDA at 1-800-FDA-1088. Where should I keep my medicine? This drug is given in a hospital or clinic and will not be stored at home. NOTE: This sheet is a summary. It may not cover all possible information. If you have questions about this medicine, talk to your doctor, pharmacist, or health care provider.  2015, Elsevier/Gold Standard. (2008-03-07 15:13:04)  

## 2014-08-18 NOTE — Progress Notes (Signed)
Lisa James  [phone]) [phone] Fax:(336) [phone]  ID: Lisa James OB: 21-Feb-1928 MR#: [id number] PNT#:[id number] Patient Care Team: Darlyne Russian, MD as PCP - General (Family Medicine)  DIAGNOSIS: Thrombocytopenia-likely immune thrombocytopenia  INTERVAL HISTORY: Lisa James is here today for a follow-up. She is doing well and has had no problems since her last visit here with [place].  Today her platelets are 47. She has had no bleeding.  Her appetite is good and she is drinking plenty of fluids. Her weight is stable at 120 lbs.  She denies fatigue, fever, chills, cough, rash, headache, dizziness, SOB, chest pain, palpitations, abdominal pain, constipation, diarrhea, blood in urine or stool. She has had no bone pain.  She denies swelling, tenderness, numbness or tingling in her extremities. She still likes living in the nursing home. She still walks every day with her niece and enjoys herself there.  CURRENT TREATMENT: Nplate as indicated  REVIEW OF SYSTEMS: All other 10 point review of systems is negative except for those issues mentioned above.   PAST MEDICAL HISTORY: Past Medical History  Diagnosis Date  . Acute MI, lateral wall   . Chronic systolic heart failure   . Ischemic heart disease   . Mitral regurgitation   . HTN (hypertension)   . Non Hodgkin's lymphoma     of the throat  . Immune thrombocytopenic purpura 01/21/2013  . Coronary artery disease     sees Dr Claiborne Billings every 6 months  . DM (diabetes mellitus)     type 2 ; diet controlled  . Thrombocytopenia     sees Dr Marin Olp every 2 weeks ;receives Nplate prn  . Subdural hematoma, post-traumatic 2015    sees Dr Sherwood Gambler every 6 months  . Aortic stenosis   . Chronic kidney disease    PAST SURGICAL HISTORY: Past Surgical History  Procedure Laterality Date  . Coronary artery bypass graft  11/20/2007    x5  . Bone marrow biopsy  11/22/2002    left posterior iliac crest bone marrow biopsy and aspirate   . Submental lymph node excisional biopsy  10/22/2002  . Eye surgery    . Cataract extraction w/ intraocular lens  implant, bilateral Bilateral    FAMILY HISTORY Family History  Problem Relation Age of Onset  . Heart attack Father   . Diabetes Father    GYNECOLOGIC HISTORY:  No LMP recorded (lmp unknown). Patient is postmenopausal.   SOCIAL HISTORY:  History   Social History  . Marital Status: Widowed    Spouse Name: N/A    Number of Children: N/A  . Years of Education: N/A   Occupational History  . Not on file.   Social History Main Topics  . Smoking status: Never Smoker   . Smokeless tobacco: Never Used     Comment: never used tobacco.  . Alcohol Use: No  . Drug Use: No  . Sexual Activity: No   Other Topics Concern  . Not on file   Social History Narrative   ADVANCED DIRECTIVES: <no information>  HEALTH MAINTENANCE: History  Substance Use Topics  . Smoking status: Never Smoker   . Smokeless tobacco: Never Used     Comment: never used tobacco.  . Alcohol Use: No   Colonoscopy: PAP: Bone density: Lipid panel:  Allergies  Allergen Reactions  . Ace Inhibitors Cough   Current Outpatient Prescriptions  Medication Sig Dispense Refill  . atorvastatin (LIPITOR) 20 MG tablet TAKE 1 TABLET (20 MG TOTAL) BY MOUTH DAILY.  30 tablet 9  . Calcium Carbonate-Vitamin D (CALCIUM-VITAMIN D) 500-200 MG-UNIT per tablet Take 1 tablet by mouth daily.     . carvedilol (COREG) 6.25 MG tablet TAKE 1 TABLET (6.25 MG TOTAL) BY MOUTH 2 (TWO) TIMES DAILY WITH A MEAL. 60 tablet 9  . Cholecalciferol (VITAMIN D) 1000 UNITS capsule Take 1,000 Units by mouth at bedtime.     . irbesartan (AVAPRO) 300 MG tablet Take 1 tablet (300 mg total) by mouth daily. MUST KEEP APPOINTMENT 08/02/2014 WITH DR Claiborne Billings FOR FUTURE REFILLS. 30 tablet 0  . Multiple Vitamins-Minerals (PRESERVISION AREDS PO) Take 1 tablet by mouth daily.    . mupirocin ointment (BACTROBAN) 2 % Apply to left ear lobe 3 times a day  (Patient taking differently: Apply 1 application topically 3 (three) times daily. Apply to left ear lobe 3 times a day for skin cancer) 22 g 0  . potassium chloride SA (K-DUR,KLOR-CON) 20 MEQ tablet Take 20 mEq by mouth every other day.    . romiPLOStim (NPLATE) 250 MCG injection Inject 1 mcg/kg into the skin as needed (every 2-3 weeks for low platelets). Given @ Oncology    . sertraline (ZOLOFT) 25 MG tablet TAKE 1 TABLET (25 MG TOTAL) BY MOUTH DAILY. 30 tablet 4  . spironolactone (ALDACTONE) 25 MG tablet Take 25 mg by mouth daily.    Marland Kitchen UNKNOWN TO PATIENT 06-28-14  Printed medication list given to patient, to give to her niece. Asked niece to please call MD office so we can verify  if medications are correct.    . furosemide (LASIX) 20 MG tablet Take 1 tablet (20 mg total) by mouth daily. PATIENT NEEDS OFFICE VISIT FOR ADDITIONAL REFILLS 30 tablet 0   No current facility-administered medications for this visit.   Facility-Administered Medications Ordered in Other Visits  Medication Dose Route Frequency Provider Last Rate Last Dose  . diphenhydrAMINE (BENADRYL) tablet 25 mg  25 mg Oral Q6H PRN Volanda Napoleon, MD   25 mg at 01/28/13 0913  . romiPLOStim (NPLATE) injection 270 mcg  5 mcg/kg (Order-Specific) Subcutaneous Once Volanda Napoleon, MD       OBJECTIVE: Filed Vitals:   08/18/14 1119  BP: 172/60  Pulse: 57  Temp: 98.3 F (36.8 C)  Resp: 16   Body mass index is 23.44 kg/(m^2). ECOG FS:0 - Asymptomatic Ocular: Sclerae unicteric, pupils equal, round and reactive to light Ear-nose-throat: Oropharynx clear, dentition fair Lymphatic: No cervical or supraclavicular adenopathy Lungs no rales or rhonchi, good excursion bilaterally Heart regular rate and rhythm, no murmur appreciated Abd soft, nontender, positive bowel sounds MSK no focal spinal tenderness, no joint edema Neuro: non-focal, well-oriented, appropriate affect Breasts: Deferred  LAB RESULTS: CMP     Component Value  Date/Time   NA 137 06/10/2014 1536   NA 142 01/21/2013 1157   K 4.6 06/10/2014 1536   K 5.3* 01/21/2013 1157   CL 98 06/10/2014 1536   CL 103 01/21/2013 1157   CO2 26 06/10/2014 1536   CO2 27 01/21/2013 1157   GLUCOSE 132* 06/10/2014 1536   GLUCOSE 123* 01/21/2013 1157   BUN 31* 06/10/2014 1536   BUN 32* 01/21/2013 1157   CREATININE 1.55* 06/10/2014 1536   CREATININE 1.35* 03/26/2014 0956   CALCIUM 9.9 06/10/2014 1536   CALCIUM 9.8 01/21/2013 1157   PROT 6.2 03/26/2014 0956   PROT 6.4 01/21/2013 1157   ALBUMIN 4.4 03/26/2014 0956   AST 18 03/26/2014 0956   AST 24 01/21/2013 1157  ALT 13 03/26/2014 0956   ALT 18 01/21/2013 1157   ALKPHOS 95 03/26/2014 0956   ALKPHOS 67 01/21/2013 1157   BILITOT 1.8* 03/26/2014 0956   BILITOT 1.8* 03/26/2014 0956   BILITOT 1.10 01/21/2013 1157   GFRNONAA 30* 06/10/2014 1536   GFRAA 34* 06/10/2014 1536   No results found for: SPEP Lab Results  Component Value Date   WBC 7.2 08/18/2014   NEUTROABS 4.7 08/18/2014   HGB 13.0 08/18/2014   HCT 37.9 08/18/2014   MCV 99 08/18/2014   PLT 47* 08/18/2014   No results found for: LABCA2 No components found for: JJOAC166 No results for input(s): INR in the last 168 hours.  STUDIES: None  ASSESSMENT/PLAN: Lisa James is a very pleasant 81 year old female with thrombocytopenia. This is likely immune thrombocytopenia. She had a bone marrow biopsy last year. She is doing well and is asymptomatic at this time.  Today her platelets are 47 and she has had no bleeding.  We will give her another increased dose of NPlate today.  We will see her again in 2 weeks for labs and follow-up. She is in agreement and knows to call with any questions or concerns and to go to the ED in the event of an emergency. We can certainly see her sooner if need be.   Eliezer Bottom, NP 08/18/2014 11:44 AM

## 2014-08-19 ENCOUNTER — Telehealth (HOSPITAL_COMMUNITY): Payer: Self-pay | Admitting: *Deleted

## 2014-08-25 ENCOUNTER — Other Ambulatory Visit: Payer: Self-pay | Admitting: Physician Assistant

## 2014-08-29 ENCOUNTER — Other Ambulatory Visit: Payer: Self-pay | Admitting: Family

## 2014-09-01 ENCOUNTER — Other Ambulatory Visit (HOSPITAL_BASED_OUTPATIENT_CLINIC_OR_DEPARTMENT_OTHER): Payer: 59 | Admitting: Lab

## 2014-09-01 ENCOUNTER — Ambulatory Visit (HOSPITAL_BASED_OUTPATIENT_CLINIC_OR_DEPARTMENT_OTHER): Payer: 59 | Admitting: Family

## 2014-09-01 ENCOUNTER — Encounter: Payer: Self-pay | Admitting: Family

## 2014-09-01 DIAGNOSIS — D693 Immune thrombocytopenic purpura: Secondary | ICD-10-CM

## 2014-09-01 LAB — CBC WITH DIFFERENTIAL (CANCER CENTER ONLY)
BASO#: 0 10*3/uL (ref 0.0–0.2)
BASO%: 0.5 % (ref 0.0–2.0)
EOS ABS: 0.6 10*3/uL — AB (ref 0.0–0.5)
EOS%: 7.8 % — ABNORMAL HIGH (ref 0.0–7.0)
HCT: 38.8 % (ref 34.8–46.6)
HEMOGLOBIN: 13.3 g/dL (ref 11.6–15.9)
LYMPH#: 1.2 10*3/uL (ref 0.9–3.3)
LYMPH%: 15.3 % (ref 14.0–48.0)
MCH: 34.1 pg — ABNORMAL HIGH (ref 26.0–34.0)
MCHC: 34.3 g/dL (ref 32.0–36.0)
MCV: 100 fL (ref 81–101)
MONO#: 0.6 10*3/uL (ref 0.1–0.9)
MONO%: 8.1 % (ref 0.0–13.0)
NEUT#: 5.2 10*3/uL (ref 1.5–6.5)
NEUT%: 68.3 % (ref 39.6–80.0)
Platelets: 201 10*3/uL (ref 145–400)
RBC: 3.9 10*6/uL (ref 3.70–5.32)
RDW: 12.5 % (ref 11.1–15.7)
WBC: 7.5 10*3/uL (ref 3.9–10.0)

## 2014-09-01 NOTE — Progress Notes (Signed)
[place]  [phone] [phone]  ID: Lisa James OB: 04/12/1928 MR#: [id number] OBS#:[id number] Patient Care Team: Darlyne Russian, MD as PCP - General (Family Medicine)  DIAGNOSIS: Thrombocytopenia-likely immune thrombocytopenia  INTERVAL HISTORY: Lisa James is here today for a follow-up. She is doing well and has had no problems since her last visit.  Today her platelets are 201. No bleeding episodes.  Her appetite is good and she is drinking plenty of fluids. Her weight is stable at 120 lbs.  She denies fatigue, fever, chills, cough, rash, headache, dizziness, SOB, chest pain, palpitations, abdominal pain, constipation, diarrhea, blood in urine or stool. She has had no bone pain.  She denies swelling, tenderness, numbness or tingling in her extremities. She is living in a nursing home and really likes it there. They have a lot of activities that she is involved in. She still walks every day with her niece.  CURRENT TREATMENT: Nplate as indicated  REVIEW OF SYSTEMS: All other 10 point review of systems is negative except for those issues mentioned above.   PAST MEDICAL HISTORY: Past Medical History  Diagnosis Date  . Acute MI, lateral wall   . Chronic systolic heart failure   . Ischemic heart disease   . Mitral regurgitation   . HTN (hypertension)   . Non Hodgkin's lymphoma     of the throat  . Immune thrombocytopenic purpura 01/21/2013  . Coronary artery disease     sees Dr Claiborne Billings every 6 months  . DM (diabetes mellitus)     type 2 ; diet controlled  . Thrombocytopenia     sees Dr Marin Olp every 2 weeks ;receives Nplate prn  . Subdural hematoma, post-traumatic 2015    sees Dr Sherwood Gambler every 6 months  . Aortic stenosis   . Chronic kidney disease    PAST SURGICAL HISTORY: Past Surgical History  Procedure Laterality Date  . Coronary artery bypass graft  11/20/2007    x5  . Bone marrow biopsy  11/22/2002    left posterior iliac crest  bone marrow biopsy and aspirate  . Submental lymph node excisional biopsy  10/22/2002  . Eye surgery    . Cataract extraction w/ intraocular lens  implant, bilateral Bilateral    FAMILY HISTORY Family History  Problem Relation Age of Onset  . Heart attack Father   . Diabetes Father    GYNECOLOGIC HISTORY:  No LMP recorded (lmp unknown). Patient is postmenopausal.   SOCIAL HISTORY:  History   Social History  . Marital Status: Widowed    Spouse Name: N/A  . Number of Children: N/A  . Years of Education: N/A   Occupational History  . Not on file.   Social History Main Topics  . Smoking status: Never Smoker   . Smokeless tobacco: Never Used     Comment: never used tobacco.  . Alcohol Use: No  . Drug Use: No  . Sexual Activity: No   Other Topics Concern  . Not on file   Social History Narrative   ADVANCED DIRECTIVES: <no information>  HEALTH MAINTENANCE: History  Substance Use Topics  . Smoking status: Never Smoker   . Smokeless tobacco: Never Used     Comment: never used tobacco.  . Alcohol Use: No   Colonoscopy: PAP: Bone density: Lipid panel:  Allergies  Allergen Reactions  . Ace Inhibitors Cough   Current Outpatient Prescriptions  Medication Sig Dispense Refill  . atorvastatin (LIPITOR) 20 MG tablet TAKE 1 TABLET (20  MG TOTAL) BY MOUTH DAILY. 30 tablet 9  . Calcium Carbonate-Vitamin D (CALCIUM-VITAMIN D) 500-200 MG-UNIT per tablet Take 1 tablet by mouth daily.     . carvedilol (COREG) 6.25 MG tablet TAKE 1 TABLET (6.25 MG TOTAL) BY MOUTH 2 (TWO) TIMES DAILY WITH A MEAL. 60 tablet 9  . Cholecalciferol (VITAMIN D) 1000 UNITS capsule Take 1,000 Units by mouth at bedtime.     . furosemide (LASIX) 20 MG tablet Take 1 tablet (20 mg total) by mouth daily. PATIENT NEEDS OFFICE VISIT FOR ADDITIONAL REFILLS 30 tablet 0  . irbesartan (AVAPRO) 300 MG tablet Take 1 tablet (300 mg total) by mouth daily. MUST KEEP APPOINTMENT 08/02/2014 WITH DR Claiborne Billings FOR FUTURE REFILLS.  30 tablet 0  . Multiple Vitamins-Minerals (PRESERVISION AREDS PO) Take 1 tablet by mouth daily.    . mupirocin ointment (BACTROBAN) 2 % Apply to left ear lobe 3 times a day (Patient taking differently: Apply 1 application topically 3 (three) times daily. Apply to left ear lobe 3 times a day for skin cancer) 22 g 0  . potassium chloride SA (K-DUR,KLOR-CON) 20 MEQ tablet Take 20 mEq by mouth every other day.    . romiPLOStim (NPLATE) 250 MCG injection Inject 1 mcg/kg into the skin as needed (every 2-3 weeks for low platelets). Given @ Oncology    . sertraline (ZOLOFT) 25 MG tablet TAKE 1 TABLET (25 MG TOTAL) BY MOUTH DAILY. 30 tablet 4  . spironolactone (ALDACTONE) 25 MG tablet Take 25 mg by mouth daily.    Marland Kitchen UNKNOWN TO PATIENT 06-28-14  Printed medication list given to patient, to give to her niece. Asked niece to please call MD office so we can verify  if medications are correct.     No current facility-administered medications for this visit.   OBJECTIVE: Filed Vitals:   09/01/14 1045  BP: 174/54  Pulse: 56  Temp: 97.9 F (36.6 C)  Resp: 14   Body mass index is 23.44 kg/(m^2). ECOG FS:0 - Asymptomatic Ocular: Sclerae unicteric, pupils equal, round and reactive to light Ear-nose-throat: Oropharynx clear, dentition fair Lymphatic: No cervical or supraclavicular adenopathy Lungs no rales or rhonchi, good excursion bilaterally Heart regular rate and rhythm, no murmur appreciated Abd soft, nontender, positive bowel sounds MSK no focal spinal tenderness, no joint edema Neuro: non-focal, well-oriented, appropriate affect Breasts: Deferred  LAB RESULTS: CMP     Component Value Date/Time   NA 137 06/10/2014 1536   NA 142 01/21/2013 1157   K 4.6 06/10/2014 1536   K 5.3* 01/21/2013 1157   CL 98 06/10/2014 1536   CL 103 01/21/2013 1157   CO2 26 06/10/2014 1536   CO2 27 01/21/2013 1157   GLUCOSE 132* 06/10/2014 1536   GLUCOSE 123* 01/21/2013 1157   BUN 31* 06/10/2014 1536   BUN 32*  01/21/2013 1157   CREATININE 1.55* 06/10/2014 1536   CREATININE 1.35* 03/26/2014 0956   CALCIUM 9.9 06/10/2014 1536   CALCIUM 9.8 01/21/2013 1157   PROT 6.2 03/26/2014 0956   PROT 6.4 01/21/2013 1157   ALBUMIN 4.4 03/26/2014 0956   AST 18 03/26/2014 0956   AST 24 01/21/2013 1157   ALT 13 03/26/2014 0956   ALT 18 01/21/2013 1157   ALKPHOS 95 03/26/2014 0956   ALKPHOS 67 01/21/2013 1157   BILITOT 1.8* 03/26/2014 0956   BILITOT 1.8* 03/26/2014 0956   BILITOT 1.10 01/21/2013 1157   GFRNONAA 30* 06/10/2014 1536   GFRAA 34* 06/10/2014 1536   No results found for:  SPEP Lab Results  Component Value Date   WBC 7.5 09/01/2014   NEUTROABS 5.2 09/01/2014   HGB 13.3 09/01/2014   HCT 38.8 09/01/2014   MCV 100 09/01/2014   PLT 201 09/01/2014   No results found for: LABCA2 No components found for: [id number] No results for input(s): INR in the last 168 hours.  STUDIES: None  ASSESSMENT/PLAN: Lisa James is a very pleasant 81 year old female with thrombocytopenia. She is doing well and is asymptomatic at this time.  Today her platelets are up to 201 and she has had no episodes of bleeding.  She will not be needing Nplate today.  We will see her again in 1 month for labs and follow-up. She is in agreement and knows to call with any questions or concerns and to go to the ED in the event of an emergency. We can certainly see her sooner if need be.   Eliezer Bottom, NP 09/01/2014 10:52 AM

## 2014-09-08 ENCOUNTER — Ambulatory Visit (HOSPITAL_COMMUNITY)
Admission: RE | Admit: 2014-09-08 | Discharge: 2014-09-08 | Disposition: A | Discharge status: Home / Self Care | Payer: Medicare Other | Source: Ambulatory Visit | Attending: Cardiology | Admitting: Cardiology

## 2014-09-08 DIAGNOSIS — I2510 Atherosclerotic heart disease of native coronary artery without angina pectoris: Secondary | ICD-10-CM | POA: Insufficient documentation

## 2014-09-08 DIAGNOSIS — E119 Type 2 diabetes mellitus without complications: Secondary | ICD-10-CM | POA: Insufficient documentation

## 2014-09-08 DIAGNOSIS — I10 Essential (primary) hypertension: Secondary | ICD-10-CM | POA: Insufficient documentation

## 2014-09-08 NOTE — Progress Notes (Signed)
2D Echo Performed 09/08/2014    Lisa James, RCS

## 2014-09-09 LAB — COMPREHENSIVE METABOLIC PANEL
ALT: 14 U/L (ref 0–35)
AST: 22 U/L (ref 0–37)
Albumin: 4.2 g/dL (ref 3.5–5.2)
Alkaline Phosphatase: 85 U/L (ref 39–117)
BILIRUBIN TOTAL: 1.6 mg/dL — AB (ref 0.2–1.2)
BUN: 22 mg/dL (ref 6–23)
CO2: 29 mEq/L (ref 19–32)
Calcium: 9.4 mg/dL (ref 8.4–10.5)
Chloride: 101 mEq/L (ref 96–112)
Creat: 1.34 mg/dL — ABNORMAL HIGH (ref 0.50–1.10)
Glucose, Bld: 150 mg/dL — ABNORMAL HIGH (ref 70–99)
Potassium: 4.7 mEq/L (ref 3.5–5.3)
Sodium: 136 mEq/L (ref 135–145)
Total Protein: 6 g/dL (ref 6.0–8.3)

## 2014-09-09 LAB — CBC
HCT: 39.4 % (ref 36.0–46.0)
Hemoglobin: 13 g/dL (ref 12.0–15.0)
MCH: 33.1 pg (ref 26.0–34.0)
MCHC: 33 g/dL (ref 30.0–36.0)
MCV: 100.3 fL — AB (ref 78.0–100.0)
MPV: 10.3 fL (ref 8.6–12.4)
Platelets: 156 10*3/uL (ref 150–400)
RBC: 3.93 MIL/uL (ref 3.87–5.11)
RDW: 13.4 % (ref 11.5–15.5)
WBC: 7.4 10*3/uL (ref 4.0–10.5)

## 2014-09-09 LAB — LIPID PANEL
CHOLESTEROL: 117 mg/dL (ref 0–200)
HDL: 25 mg/dL — ABNORMAL LOW (ref >39)
LDL Cholesterol: 47 mg/dL (ref 0–99)
Total CHOL/HDL Ratio: 4.7 Ratio
Triglycerides: 224 mg/dL — ABNORMAL HIGH (ref <150)
VLDL: 45 mg/dL — ABNORMAL HIGH (ref 0–40)

## 2014-09-09 LAB — TSH: TSH: 5.045 u[IU]/mL — ABNORMAL HIGH (ref 0.350–4.500)

## 2014-09-15 ENCOUNTER — Other Ambulatory Visit: Payer: Medicare Other | Admitting: Lab

## 2014-09-15 ENCOUNTER — Ambulatory Visit: Payer: Medicare Other | Admitting: Hematology & Oncology

## 2014-09-20 ENCOUNTER — Encounter: Payer: Self-pay | Admitting: *Deleted

## 2014-09-21 ENCOUNTER — Other Ambulatory Visit: Payer: Self-pay | Admitting: Emergency Medicine

## 2014-09-21 ENCOUNTER — Other Ambulatory Visit: Payer: Self-pay | Admitting: Cardiovascular Disease

## 2014-09-21 ENCOUNTER — Other Ambulatory Visit: Payer: Self-pay | Admitting: Physician Assistant

## 2014-09-22 NOTE — Telephone Encounter (Signed)
Rx(s) sent to pharmacy electronically.  

## 2014-09-28 ENCOUNTER — Other Ambulatory Visit: Payer: Self-pay | Admitting: Nurse Practitioner

## 2014-09-28 DIAGNOSIS — C8590 Non-Hodgkin lymphoma, unspecified, unspecified site: Secondary | ICD-10-CM

## 2014-09-29 ENCOUNTER — Ambulatory Visit (HOSPITAL_BASED_OUTPATIENT_CLINIC_OR_DEPARTMENT_OTHER): Payer: Medicare Other

## 2014-09-29 ENCOUNTER — Other Ambulatory Visit (HOSPITAL_BASED_OUTPATIENT_CLINIC_OR_DEPARTMENT_OTHER): Payer: Medicare Other | Admitting: Lab

## 2014-09-29 ENCOUNTER — Ambulatory Visit (HOSPITAL_BASED_OUTPATIENT_CLINIC_OR_DEPARTMENT_OTHER): Payer: Medicare Other | Admitting: Hematology & Oncology

## 2014-09-29 VITALS — BP 116/54 | HR 60 | Temp 98.2°F | Resp 14 | Ht 60.0 in | Wt 121.0 lb

## 2014-09-29 DIAGNOSIS — D696 Thrombocytopenia, unspecified: Secondary | ICD-10-CM

## 2014-09-29 DIAGNOSIS — C8590 Non-Hodgkin lymphoma, unspecified, unspecified site: Secondary | ICD-10-CM

## 2014-09-29 DIAGNOSIS — D693 Immune thrombocytopenic purpura: Secondary | ICD-10-CM

## 2014-09-29 LAB — CBC WITH DIFFERENTIAL (CANCER CENTER ONLY)
BASO#: 0 10*3/uL (ref 0.0–0.2)
BASO%: 0.3 % (ref 0.0–2.0)
EOS ABS: 0.4 10*3/uL (ref 0.0–0.5)
EOS%: 7.4 % — ABNORMAL HIGH (ref 0.0–7.0)
HCT: 35.3 % (ref 34.8–46.6)
HGB: 12.3 g/dL (ref 11.6–15.9)
LYMPH#: 1 10*3/uL (ref 0.9–3.3)
LYMPH%: 17.1 % (ref 14.0–48.0)
MCH: 34.3 pg — AB (ref 26.0–34.0)
MCHC: 34.8 g/dL (ref 32.0–36.0)
MCV: 98 fL (ref 81–101)
MONO#: 0.4 10*3/uL (ref 0.1–0.9)
MONO%: 7.4 % (ref 0.0–13.0)
NEUT#: 4 10*3/uL (ref 1.5–6.5)
NEUT%: 67.8 % (ref 39.6–80.0)
PLATELETS: 50 10*3/uL — AB (ref 145–400)
RBC: 3.59 10*6/uL — AB (ref 3.70–5.32)
RDW: 12.4 % (ref 11.1–15.7)
WBC: 6 10*3/uL (ref 3.9–10.0)

## 2014-09-29 MED ORDER — ROMIPLOSTIM 250 MCG ~~LOC~~ SOLR
270.0000 ug | Freq: Once | SUBCUTANEOUS | Status: AC
  Administered 2014-09-29: 270 ug via SUBCUTANEOUS
  Filled 2014-09-29: qty 0.54

## 2014-09-29 NOTE — Patient Instructions (Signed)
Romiplostim injection What is this medicine? ROMIPLOSTIM (roe mi PLOE stim) helps your body make more platelets. This medicine is used to treat low platelets caused by chronic idiopathic thrombocytopenic purpura (ITP). This medicine may be used for other purposes; ask your health care provider or pharmacist if you have questions. COMMON BRAND NAME(S): Nplate What should I tell my health care provider before I take this medicine? They need to know if you have any of these conditions: -cancer or myelodysplastic syndrome -low blood counts, like low white cell, platelet, or red cell counts -take medicines that treat or prevent blood clots -an unusual or allergic reaction to romiplostim, mannitol, other medicines, foods, dyes, or preservatives -pregnant or trying to get pregnant -breast-feeding How should I use this medicine? This medicine is for injection under the skin. It is given by a health care professional in a hospital or clinic setting. A special MedGuide will be given to you before your injection. Read this information carefully each time. Talk to your pediatrician regarding the use of this medicine in children. Special care may be needed. Overdosage: If you think you have taken too much of this medicine contact a poison control center or emergency room at once. NOTE: This medicine is only for you. Do not share this medicine with others. What if I miss a dose? It is important not to miss your dose. Call your doctor or health care professional if you are unable to keep an appointment. What may interact with this medicine? Interactions are not expected. This list may not describe all possible interactions. Give your health care provider a list of all the medicines, herbs, non-prescription drugs, or dietary supplements you use. Also tell them if you smoke, drink alcohol, or use illegal drugs. Some items may interact with your medicine. What should I watch for while using this  medicine? Your condition will be monitored carefully while you are receiving this medicine. Visit your prescriber or health care professional for regular checks on your progress and for the needed blood tests. It is important to keep all appointments. What side effects may I notice from receiving this medicine? Side effects that you should report to your doctor or health care professional as soon as possible: -allergic reactions like skin rash, itching or hives, swelling of the face, lips, or tongue -shortness of breath, chest pain, swelling in a leg -unusual bleeding or bruising Side effects that usually do not require medical attention (report to your doctor or health care professional if they continue or are bothersome): -dizziness -headache -muscle aches -pain in arms and legs -stomach pain -trouble sleeping This list may not describe all possible side effects. Call your doctor for medical advice about side effects. You may report side effects to FDA at 1-800-FDA-1088. Where should I keep my medicine? This drug is given in a hospital or clinic and will not be stored at home. NOTE: This sheet is a summary. It may not cover all possible information. If you have questions about this medicine, talk to your doctor, pharmacist, or health care provider.  2015, Elsevier/Gold Standard. (2008-03-07 15:13:04)  

## 2014-09-29 NOTE — Progress Notes (Signed)
Hematology and Oncology Follow Up Visit  JOVIE SWANNER [id number] 1927-12-31 81 y.o. 09/29/2014   Principle Diagnosis:   Thrombocytopenia-likely immune thrombocytopenia  Current Therapy:    Nplate as indicated - last dose given on 09/01/2014 at 3 mg/kg     Interim History:  Ms.  Whitehorn is back for followup. She looks great. She's had no problems with bleeding or bruising.  Her birthday is next week. She is looking for to this. She will be 81 years old.  She's had no problems with headache. She's had no weakness. She's had no change in bowel or bladder habits.  She's had no leg swelling. She's had no rashes.  Overall, her performance status is ECOG 2.   Medications:  Current outpatient prescriptions:  .  atorvastatin (LIPITOR) 20 MG tablet, TAKE 1 TABLET (20 MG TOTAL) BY MOUTH DAILY., Disp: 30 tablet, Rfl: 9 .  Calcium Carbonate-Vitamin D (CALCIUM-VITAMIN D) 500-200 MG-UNIT per tablet, Take 1 tablet by mouth daily. , Disp: , Rfl:  .  carvedilol (COREG) 6.25 MG tablet, TAKE 1 TABLET (6.25 MG TOTAL) BY MOUTH 2 (TWO) TIMES DAILY WITH A MEAL., Disp: 60 tablet, Rfl: 9 .  Cholecalciferol (VITAMIN D) 1000 UNITS capsule, Take 1,000 Units by mouth at bedtime. , Disp: , Rfl:  .  furosemide (LASIX) 20 MG tablet, Take 1 tablet (20 mg total) by mouth daily. PATIENT NEEDS OFFICE VISIT FOR ADDITIONAL REFILLS, Disp: 30 tablet, Rfl: 0 .  irbesartan (AVAPRO) 300 MG tablet, TAKE 1 TABLET BY MOUTH EVERY DAY, Disp: 30 tablet, Rfl: 10 .  Multiple Vitamins-Minerals (PRESERVISION AREDS PO), Take 1 tablet by mouth daily., Disp: , Rfl:  .  mupirocin ointment (BACTROBAN) 2 %, Apply to left ear lobe 3 times a day (Patient taking differently: Apply 1 application topically 3 (three) times daily. Apply to left ear lobe 3 times a day for skin cancer), Disp: 22 g, Rfl: 0 .  potassium chloride SA (K-DUR,KLOR-CON) 20 MEQ tablet, Take 20 mEq by mouth every other day., Disp: , Rfl:  .  romiPLOStim (NPLATE) 250 MCG  injection, Inject 1 mcg/kg into the skin as needed (every 2-3 weeks for low platelets). Given @ Oncology, Disp: , Rfl:  .  sertraline (ZOLOFT) 25 MG tablet, TAKE 1 TABLET (25 MG TOTAL) BY MOUTH DAILY., Disp: 30 tablet, Rfl: 4 .  spironolactone (ALDACTONE) 25 MG tablet, Take 25 mg by mouth daily., Disp: , Rfl:  No current facility-administered medications for this visit.  Facility-Administered Medications Ordered in Other Visits:  .  romiPLOStim (NPLATE) injection 270 mcg, 270 mcg, Subcutaneous, Once, Volanda Napoleon, MD  Allergies:  Allergies  Allergen Reactions  . Ace Inhibitors Cough    Past Medical History, Surgical history, Social history, and Family History were reviewed and updated.  Review of Systems: As above  Physical Exam:  vitals were not taken for this visit.  Totally, petite white female in no obvious distress. Head and neck exam shows no ocular or oral lesions. Pupils reacted properly. Extraocular muscles are intact. There is no adenopathy in the neck. Lungs are clear. Cardiac exam regular and rhythm . She is a 1/6 systolic ejection murmur. Abdomen is soft. She has good bowel sounds. There is no fluid wave. There is no palpable liver or spleen tip. Back exam shows no tenderness over the spine. She has some slight kyphosis. Extremities shows no clubbing cyanosis or edema. His age related osteoarthritic changes. She has good strength bilaterally. Neurological exam is without any deficits.  Lab Results  Component Value Date   WBC 6.0 09/29/2014   HGB 12.3 09/29/2014   HCT 35.3 09/29/2014   MCV 98 09/29/2014   PLT 50* 09/29/2014     Chemistry      Component Value Date/Time   NA 136 09/08/2014 1038   NA 142 01/21/2013 1157   K 4.7 09/08/2014 1038   K 5.3* 01/21/2013 1157   CL 101 09/08/2014 1038   CL 103 01/21/2013 1157   CO2 29 09/08/2014 1038   CO2 27 01/21/2013 1157   BUN 22 09/08/2014 1038   BUN 32* 01/21/2013 1157   CREATININE 1.34* 09/08/2014 1038    CREATININE 1.55* 06/10/2014 1536      Component Value Date/Time   CALCIUM 9.4 09/08/2014 1038   CALCIUM 9.8 01/21/2013 1157   ALKPHOS 85 09/08/2014 1038   ALKPHOS 67 01/21/2013 1157   AST 22 09/08/2014 1038   AST 24 01/21/2013 1157   ALT 14 09/08/2014 1038   ALT 18 01/21/2013 1157   BILITOT 1.6* 09/08/2014 1038   BILITOT 1.10 01/21/2013 1157         Impression and Plan: Ms. Peplinski is a 81 year old female with thrombocytopenia. This is likely immune thrombocytopenia. I actually did do a bone marrow on her last year.  She responds well to Nplate. We had to give her Nplate every 5-6 weeks.  We will have her come back in 5 weeks.     Volanda Napoleon, MD 3/10/201610:37 AM

## 2014-10-13 ENCOUNTER — Other Ambulatory Visit: Payer: Self-pay | Admitting: Emergency Medicine

## 2014-10-20 ENCOUNTER — Other Ambulatory Visit: Payer: Self-pay | Admitting: Physician Assistant

## 2014-10-20 ENCOUNTER — Other Ambulatory Visit: Payer: Self-pay | Admitting: Emergency Medicine

## 2014-10-20 ENCOUNTER — Other Ambulatory Visit: Payer: Self-pay | Admitting: Cardiovascular Disease

## 2014-10-27 ENCOUNTER — Other Ambulatory Visit (HOSPITAL_BASED_OUTPATIENT_CLINIC_OR_DEPARTMENT_OTHER): Payer: Medicare Other

## 2014-10-27 ENCOUNTER — Ambulatory Visit (HOSPITAL_BASED_OUTPATIENT_CLINIC_OR_DEPARTMENT_OTHER): Payer: Medicare Other

## 2014-10-27 ENCOUNTER — Encounter: Payer: Self-pay | Admitting: Family

## 2014-10-27 ENCOUNTER — Ambulatory Visit (HOSPITAL_BASED_OUTPATIENT_CLINIC_OR_DEPARTMENT_OTHER): Payer: Medicare Other | Admitting: Family

## 2014-10-27 VITALS — BP 103/61 | HR 61 | Temp 97.4°F | Resp 14 | Ht 60.0 in | Wt 120.0 lb

## 2014-10-27 DIAGNOSIS — D696 Thrombocytopenia, unspecified: Secondary | ICD-10-CM | POA: Diagnosis not present

## 2014-10-27 DIAGNOSIS — D693 Immune thrombocytopenic purpura: Secondary | ICD-10-CM

## 2014-10-27 DIAGNOSIS — C8590 Non-Hodgkin lymphoma, unspecified, unspecified site: Secondary | ICD-10-CM

## 2014-10-27 LAB — CBC WITH DIFFERENTIAL (CANCER CENTER ONLY)
BASO#: 0 10*3/uL (ref 0.0–0.2)
BASO%: 0.3 % (ref 0.0–2.0)
EOS%: 6.2 % (ref 0.0–7.0)
Eosinophils Absolute: 0.6 10*3/uL — ABNORMAL HIGH (ref 0.0–0.5)
HEMATOCRIT: 38 % (ref 34.8–46.6)
HEMOGLOBIN: 13.1 g/dL (ref 11.6–15.9)
LYMPH#: 1.5 10*3/uL (ref 0.9–3.3)
LYMPH%: 16.2 % (ref 14.0–48.0)
MCH: 33.9 pg (ref 26.0–34.0)
MCHC: 34.5 g/dL (ref 32.0–36.0)
MCV: 98 fL (ref 81–101)
MONO#: 0.8 10*3/uL (ref 0.1–0.9)
MONO%: 8.7 % (ref 0.0–13.0)
NEUT#: 6.3 10*3/uL (ref 1.5–6.5)
NEUT%: 68.6 % (ref 39.6–80.0)
Platelets: 71 10*3/uL — ABNORMAL LOW (ref 145–400)
RBC: 3.86 10*6/uL (ref 3.70–5.32)
RDW: 12.5 % (ref 11.1–15.7)
WBC: 9.1 10*3/uL (ref 3.9–10.0)

## 2014-10-27 MED ORDER — ROMIPLOSTIM 250 MCG ~~LOC~~ SOLR
3.0000 ug/kg | Freq: Once | SUBCUTANEOUS | Status: AC
  Administered 2014-10-27: 165 ug via SUBCUTANEOUS
  Filled 2014-10-27: qty 0.33

## 2014-10-27 MED ORDER — ROMIPLOSTIM 250 MCG ~~LOC~~ SOLR: 3.0000 ug/kg | Freq: Once | SUBCUTANEOUS | Status: DC

## 2014-10-27 NOTE — Progress Notes (Signed)
Hematology and Oncology Follow Up Visit  Lisa James [id number] 01/08/1928 81 y.o. 10/27/2014   Principle Diagnosis:  Thrombocytopenia-likely immune thrombocytopenia  Current Therapy:   Nplate as indicated - last dose given on 09/29/2014 at 3 mg/kg    Interim History: Lisa James is here today for a follow-up. She is doing well and has has no complaints today.  Today her platelets are 71. No episodes of bleeding or bruising.   Her appetite is good and she is drinking plenty of fluids. Her weight is stable.  She denies fatigue, fever, chills, n/v, cough, rash, headache, dizziness, SOB, chest pain, palpitations, abdominal pain, constipation, diarrhea, blood in urine or stool. She has had no bone pain.  She denies swelling, tenderness, numbness or tingling in her extremities. She lives in an assisted living home and walks every day with her niece.   Medications:    Medication List       This list is accurate as of: 10/27/14 10:42 AM.  Always use your most recent med list.               atorvastatin 20 MG tablet  Commonly known as:  LIPITOR  TAKE 1 TABLET (20 MG TOTAL) BY MOUTH DAILY.     calcium-vitamin D 500-200 MG-UNIT per tablet  Take 1 tablet by mouth daily.     carvedilol 6.25 MG tablet  Commonly known as:  COREG  TAKE 1 TABLET (6.25 MG TOTAL) BY MOUTH 2 (TWO) TIMES DAILY WITH A MEAL.     furosemide 20 MG tablet  Commonly known as:  LASIX  Take 1 tablet (20 mg total) by mouth daily. PATIENT NEEDS OFFICE VISIT FOR ADDITIONAL REFILLS     irbesartan 300 MG tablet  Commonly known as:  AVAPRO  TAKE 1 TABLET BY MOUTH EVERY DAY     potassium chloride SA 20 MEQ tablet  Commonly known as:  KLOR-CON M20  Take 1 tablet (20 mEq total) by mouth daily. PATIENT NEEDS OFFICE VISIT FOR ADDITIONAL REFILLS     PRESERVISION AREDS PO  Take 1 tablet by mouth daily.     romiPLOStim 250 MCG injection  Commonly known as:  NPLATE  Inject 1 mcg/kg into the skin as needed  (every 2-3 weeks for low platelets). Given @ Oncology     sertraline 25 MG tablet  Commonly known as:  ZOLOFT  Take 1 tablet (25 mg total) by mouth daily. PATIENT NEEDS OFFICE VISIT FOR ADDITIONAL REFILLS     spironolactone 25 MG tablet  Commonly known as:  ALDACTONE  Take 25 mg by mouth daily.     Vitamin D 1000 UNITS capsule  Take 1,000 Units by mouth at bedtime.        Allergies:  Allergies  Allergen Reactions  . Ace Inhibitors Cough    Past Medical History, Surgical history, Social history, and Family History were reviewed and updated.  Review of Systems: All other 10 point review of systems is negative.   Physical Exam:  height is 5' (1.524 m) and weight is 120 lb (54.432 kg). Her oral temperature is 97.4 F (36.3 C). Her blood pressure is 103/61 and her pulse is 61. Her respiration is 14.   Wt Readings from Last 3 Encounters:  10/27/14 120 lb (54.432 kg)  09/29/14 121 lb (54.885 kg)  09/01/14 120 lb (54.432 kg)    Ocular: Sclerae unicteric, pupils equal, round and reactive to light Ear-nose-throat: Oropharynx clear, dentition fair Lymphatic: No cervical or supraclavicular adenopathy Lungs  no rales or rhonchi, good excursion bilaterally Heart regular rate and rhythm, no murmur appreciated Abd soft, nontender, positive bowel sounds MSK no focal spinal tenderness, no joint edema Neuro: non-focal, well-oriented, appropriate affect Breasts: Deferred  Lab Results  Component Value Date   WBC 6.0 09/29/2014   HGB 12.3 09/29/2014   HCT 35.3 09/29/2014   MCV 98 09/29/2014   PLT 50* 09/29/2014   No results found for: FERRITIN, IRON, TIBC, UIBC, IRONPCTSAT Lab Results  Component Value Date   RETICCTPCT 2.6 05/13/2011   RBC 3.59* 09/29/2014   No results found for: Nils Pyle [facility] Lab Results  Component Value Date   IGGSERUM 720 09/29/2012   IGA 115 09/29/2012   IGMSERUM 73 09/29/2012   Lab Results  Component Value Date    TOTALPROTELP 6.9 09/29/2012   TOTALPROTELP 6.9 09/29/2012   ALBUMINELP 63.4 09/29/2012   A1GS 4.3 09/29/2012   A2GS 12.3* 09/29/2012   BETS 7.2 09/29/2012   BETA2SER 3.6 09/29/2012   GAMS 9.2* 09/29/2012   MSPIKE NOT DET 09/29/2012   SPEI  09/29/2012     Comment:     Nonspecific pattern associated with slightly decreased gamma globulins. Reviewed by Odis Hollingshead, MD, PhD, FCAP (Electronic Signature on File)     Chemistry      Component Value Date/Time   NA 136 09/08/2014 1038   NA 142 01/21/2013 1157   K 4.7 09/08/2014 1038   K 5.3* 01/21/2013 1157   CL 101 09/08/2014 1038   CL 103 01/21/2013 1157   CO2 29 09/08/2014 1038   CO2 27 01/21/2013 1157   BUN 22 09/08/2014 1038   BUN 32* 01/21/2013 1157   CREATININE 1.34* 09/08/2014 1038   CREATININE 1.55* 06/10/2014 1536      Component Value Date/Time   CALCIUM 9.4 09/08/2014 1038   CALCIUM 9.8 01/21/2013 1157   ALKPHOS 85 09/08/2014 1038   ALKPHOS 67 01/21/2013 1157   AST 22 09/08/2014 1038   AST 24 01/21/2013 1157   ALT 14 09/08/2014 1038   ALT 18 01/21/2013 1157   BILITOT 1.6* 09/08/2014 1038   BILITOT 1.10 01/21/2013 1157     Impression and Plan: Lisa James is a very pleasant 81 year old female with thrombocytopenia. She is doing well and is asymptomatic at this time.  Today her platelets are 71. She has had no episodes of bleeding or bruising.  We will give her a dose of Nplate today.  We will see her again in 1 month for labs and follow-up. She is in agreement and knows to call with any questions or concerns and to go to the ED in the event of an emergency. We can certainly see her sooner if need be.    Eliezer Bottom, NP 4/7/201610:42 AM

## 2014-10-27 NOTE — Patient Instructions (Signed)
Romiplostim injection What is this medicine? ROMIPLOSTIM (roe mi PLOE stim) helps your body make more platelets. This medicine is used to treat low platelets caused by chronic idiopathic thrombocytopenic purpura (ITP). This medicine may be used for other purposes; ask your health care provider or pharmacist if you have questions. COMMON BRAND NAME(S): Nplate What should I tell my health care provider before I take this medicine? They need to know if you have any of these conditions: -cancer or myelodysplastic syndrome -low blood counts, like low white cell, platelet, or red cell counts -take medicines that treat or prevent blood clots -an unusual or allergic reaction to romiplostim, mannitol, other medicines, foods, dyes, or preservatives -pregnant or trying to get pregnant -breast-feeding How should I use this medicine? This medicine is for injection under the skin. It is given by a health care professional in a hospital or clinic setting. A special MedGuide will be given to you before your injection. Read this information carefully each time. Talk to your pediatrician regarding the use of this medicine in children. Special care may be needed. Overdosage: If you think you have taken too much of this medicine contact a poison control center or emergency room at once. NOTE: This medicine is only for you. Do not share this medicine with others. What if I miss a dose? It is important not to miss your dose. Call your doctor or health care professional if you are unable to keep an appointment. What may interact with this medicine? Interactions are not expected. This list may not describe all possible interactions. Give your health care provider a list of all the medicines, herbs, non-prescription drugs, or dietary supplements you use. Also tell them if you smoke, drink alcohol, or use illegal drugs. Some items may interact with your medicine. What should I watch for while using this  medicine? Your condition will be monitored carefully while you are receiving this medicine. Visit your prescriber or health care professional for regular checks on your progress and for the needed blood tests. It is important to keep all appointments. What side effects may I notice from receiving this medicine? Side effects that you should report to your doctor or health care professional as soon as possible: -allergic reactions like skin rash, itching or hives, swelling of the face, lips, or tongue -shortness of breath, chest pain, swelling in a leg -unusual bleeding or bruising Side effects that usually do not require medical attention (report to your doctor or health care professional if they continue or are bothersome): -dizziness -headache -muscle aches -pain in arms and legs -stomach pain -trouble sleeping This list may not describe all possible side effects. Call your doctor for medical advice about side effects. You may report side effects to FDA at 1-800-FDA-1088. Where should I keep my medicine? This drug is given in a hospital or clinic and will not be stored at home. NOTE: This sheet is a summary. It may not cover all possible information. If you have questions about this medicine, talk to your doctor, pharmacist, or health care provider.  2015, Elsevier/Gold Standard. (2008-03-07 15:13:04)  

## 2014-11-10 ENCOUNTER — Other Ambulatory Visit: Payer: Self-pay | Admitting: Emergency Medicine

## 2014-11-10 ENCOUNTER — Other Ambulatory Visit: Payer: Self-pay | Admitting: Physician Assistant

## 2014-11-14 ENCOUNTER — Other Ambulatory Visit: Payer: Self-pay | Admitting: Emergency Medicine

## 2014-11-15 ENCOUNTER — Other Ambulatory Visit: Payer: Self-pay | Admitting: Physician Assistant

## 2014-11-15 ENCOUNTER — Other Ambulatory Visit: Payer: Self-pay | Admitting: Emergency Medicine

## 2014-11-24 ENCOUNTER — Ambulatory Visit (HOSPITAL_BASED_OUTPATIENT_CLINIC_OR_DEPARTMENT_OTHER): Payer: Medicare Other

## 2014-11-24 ENCOUNTER — Ambulatory Visit (HOSPITAL_BASED_OUTPATIENT_CLINIC_OR_DEPARTMENT_OTHER): Payer: Medicare Other | Admitting: Family

## 2014-11-24 ENCOUNTER — Other Ambulatory Visit (HOSPITAL_BASED_OUTPATIENT_CLINIC_OR_DEPARTMENT_OTHER): Payer: Medicare Other

## 2014-11-24 ENCOUNTER — Encounter: Payer: Self-pay | Admitting: Family

## 2014-11-24 VITALS — BP 105/47 | HR 63 | Temp 97.6°F | Resp 14 | Ht 60.0 in | Wt 121.0 lb

## 2014-11-24 DIAGNOSIS — D693 Immune thrombocytopenic purpura: Secondary | ICD-10-CM

## 2014-11-24 DIAGNOSIS — D696 Thrombocytopenia, unspecified: Secondary | ICD-10-CM

## 2014-11-24 LAB — CBC WITH DIFFERENTIAL (CANCER CENTER ONLY)
BASO#: 0 10*3/uL (ref 0.0–0.2)
BASO%: 0.4 % (ref 0.0–2.0)
EOS%: 6.1 % (ref 0.0–7.0)
Eosinophils Absolute: 0.4 10*3/uL (ref 0.0–0.5)
HCT: 34.4 % — ABNORMAL LOW (ref 34.8–46.6)
HEMOGLOBIN: 12 g/dL (ref 11.6–15.9)
LYMPH#: 1.1 10*3/uL (ref 0.9–3.3)
LYMPH%: 18.7 % (ref 14.0–48.0)
MCH: 34.4 pg — ABNORMAL HIGH (ref 26.0–34.0)
MCHC: 34.9 g/dL (ref 32.0–36.0)
MCV: 99 fL (ref 81–101)
MONO#: 0.6 10*3/uL (ref 0.1–0.9)
MONO%: 10.2 % (ref 0.0–13.0)
NEUT#: 3.7 10*3/uL (ref 1.5–6.5)
NEUT%: 64.6 % (ref 39.6–80.0)
Platelets: 42 10*3/uL — ABNORMAL LOW (ref 145–400)
RBC: 3.49 10*6/uL — AB (ref 3.70–5.32)
RDW: 12.8 % (ref 11.1–15.7)
WBC: 5.7 10*3/uL (ref 3.9–10.0)

## 2014-11-24 MED ORDER — ROMIPLOSTIM 250 MCG ~~LOC~~ SOLR
2.0000 ug/kg | Freq: Once | SUBCUTANEOUS | Status: AC
Start: 2014-11-24 — End: 2014-11-24
  Administered 2014-11-24: 110 ug via SUBCUTANEOUS
  Filled 2014-11-24: qty 0.22

## 2014-11-24 NOTE — Patient Instructions (Signed)
Romiplostim injection What is this medicine? ROMIPLOSTIM (roe mi PLOE stim) helps your body make more platelets. This medicine is used to treat low platelets caused by chronic idiopathic thrombocytopenic purpura (ITP). This medicine may be used for other purposes; ask your health care provider or pharmacist if you have questions. COMMON BRAND NAME(S): Nplate What should I tell my health care provider before I take this medicine? They need to know if you have any of these conditions: -cancer or myelodysplastic syndrome -low blood counts, like low white cell, platelet, or red cell counts -take medicines that treat or prevent blood clots -an unusual or allergic reaction to romiplostim, mannitol, other medicines, foods, dyes, or preservatives -pregnant or trying to get pregnant -breast-feeding How should I use this medicine? This medicine is for injection under the skin. It is given by a health care professional in a hospital or clinic setting. A special MedGuide will be given to you before your injection. Read this information carefully each time. Talk to your pediatrician regarding the use of this medicine in children. Special care may be needed. Overdosage: If you think you have taken too much of this medicine contact a poison control center or emergency room at once. NOTE: This medicine is only for you. Do not share this medicine with others. What if I miss a dose? It is important not to miss your dose. Call your doctor or health care professional if you are unable to keep an appointment. What may interact with this medicine? Interactions are not expected. This list may not describe all possible interactions. Give your health care provider a list of all the medicines, herbs, non-prescription drugs, or dietary supplements you use. Also tell them if you smoke, drink alcohol, or use illegal drugs. Some items may interact with your medicine. What should I watch for while using this  medicine? Your condition will be monitored carefully while you are receiving this medicine. Visit your prescriber or health care professional for regular checks on your progress and for the needed blood tests. It is important to keep all appointments. What side effects may I notice from receiving this medicine? Side effects that you should report to your doctor or health care professional as soon as possible: -allergic reactions like skin rash, itching or hives, swelling of the face, lips, or tongue -shortness of breath, chest pain, swelling in a leg -unusual bleeding or bruising Side effects that usually do not require medical attention (report to your doctor or health care professional if they continue or are bothersome): -dizziness -headache -muscle aches -pain in arms and legs -stomach pain -trouble sleeping This list may not describe all possible side effects. Call your doctor for medical advice about side effects. You may report side effects to FDA at 1-800-FDA-1088. Where should I keep my medicine? This drug is given in a hospital or clinic and will not be stored at home. NOTE: This sheet is a summary. It may not cover all possible information. If you have questions about this medicine, talk to your doctor, pharmacist, or health care provider.  2015, Elsevier/Gold Standard. (2008-03-07 15:13:04)  

## 2014-11-24 NOTE — Progress Notes (Signed)
Hematology and Oncology Follow Up Visit  Lisa James [id number] 24-Nov-1927 81 y.o. 11/24/2014   Principle Diagnosis:  Thrombocytopenia-likely immune thrombocytopenia  Current Therapy:   Nplate as indicated - last dose given on 10/27/14    Interim History: Lisa James is here today for a follow-up. She is getting along nicely and has no complaints today. She is still staying active walking each day. She lives in an assisted living home and her niece visits her daily. They walk around the building several times a day if its nice out or inside when it rains.   She received Nplate last month for a platelet count of 71. Her platelets are 42 today so we will increase her dose.   She denies fatigue, fatigue, fever, chills, n/v, cough, rash, headache, dizziness, SOB, chest pain, palpitations, abdominal pain, constipation, diarrhea, blood in urine or stool. No episodes of bruising or bleeding.  She denies swelling, tenderness, numbness or tingling in her extremities. No new aches or pains.  Her appetite is good and she is drinking plenty of fluids. Her weight is stable.  Medications:    Medication List       This list is accurate as of: 11/24/14 11:02 AM.  Always use your most recent med list.               atorvastatin 20 MG tablet  Commonly known as:  LIPITOR  TAKE 1 TABLET BY MOUTH EVERY DAY     calcium-vitamin D 500-200 MG-UNIT per tablet  Take 1 tablet by mouth daily.     carvedilol 6.25 MG tablet  Commonly known as:  COREG  TAKE 1 TABLET BY MOUTH TWICE A DAY WITH A MEAL     furosemide 20 MG tablet  Commonly known as:  LASIX  Take 1 tablet (20 mg total) by mouth daily. PATIENT NEEDS OFFICE VISIT FOR ADDITIONAL REFILLS     furosemide 20 MG tablet  Commonly known as:  LASIX  TAKE 1 TABLET (20 MG TOTAL) BY MOUTH DAILY. PATIENT NEEDS OFFICE VISIT FOR ADDITIONAL REFILLS     irbesartan 300 MG tablet  Commonly known as:  AVAPRO  TAKE 1 TABLET BY MOUTH EVERY DAY     KLOR-CON M20 20 MEQ tablet  Generic drug:  potassium chloride SA  TAKE 1 TABLET EVERY DAY *NEEDS OFFICE VISIT FOR MORE REFILLS*     PRESERVISION AREDS PO  Take 1 tablet by mouth daily.     romiPLOStim 250 MCG injection  Commonly known as:  NPLATE  Inject 1 mcg/kg into the skin as needed (every 2-3 weeks for low platelets). Given @ Oncology     sertraline 25 MG tablet  Commonly known as:  ZOLOFT  TAKE 1 TABLET (25 MG TOTAL) BY MOUTH DAILY. PATIENT NEEDS OFFICE VISIT FOR ADDITIONAL REFILLS     spironolactone 25 MG tablet  Commonly known as:  ALDACTONE  Take 25 mg by mouth daily.     Vitamin D 1000 UNITS capsule  Take 1,000 Units by mouth at bedtime.        Allergies:  Allergies  Allergen Reactions  . Ace Inhibitors Cough    Past Medical History, Surgical history, Social history, and Family History were reviewed and updated.  Review of Systems: All other 10 point review of systems is negative.   Physical Exam:  height is 5' (1.524 m) and weight is 121 lb (54.885 kg). Her oral temperature is 97.6 F (36.4 C). Her blood pressure is 105/47 and her pulse  is 63. Her respiration is 14.   Wt Readings from Last 3 Encounters:  11/24/14 121 lb (54.885 kg)  10/27/14 120 lb (54.432 kg)  09/29/14 121 lb (54.885 kg)    Ocular: Sclerae unicteric, pupils equal, round and reactive to light Ear-nose-throat: Oropharynx clear, dentition fair Lymphatic: No cervical or supraclavicular adenopathy Lungs no rales or rhonchi, good excursion bilaterally Heart regular rate and rhythm, no murmur appreciated Abd soft, nontender, positive bowel sounds MSK no focal spinal tenderness, no joint edema Neuro: non-focal, well-oriented, appropriate affect Breasts: Deferred  Lab Results  Component Value Date   WBC 5.7 11/24/2014   HGB 12.0 11/24/2014   HCT 34.4* 11/24/2014   MCV 99 11/24/2014   PLT 42 Platelet count consistent in citrate* 11/24/2014   No results found for: FERRITIN, IRON, TIBC,  UIBC, IRONPCTSAT Lab Results  Component Value Date   RETICCTPCT 2.6 05/13/2011   RBC 3.49* 11/24/2014   No results found for: Nils Pyle [facility] Lab Results  Component Value Date   IGGSERUM 720 09/29/2012   IGA 115 09/29/2012   IGMSERUM 73 09/29/2012   Lab Results  Component Value Date   TOTALPROTELP 6.9 09/29/2012   TOTALPROTELP 6.9 09/29/2012   ALBUMINELP 63.4 09/29/2012   A1GS 4.3 09/29/2012   A2GS 12.3* 09/29/2012   BETS 7.2 09/29/2012   BETA2SER 3.6 09/29/2012   GAMS 9.2* 09/29/2012   MSPIKE NOT DET 09/29/2012   SPEI  09/29/2012     Comment:     Nonspecific pattern associated with slightly decreased gamma globulins. Reviewed by Odis Hollingshead, MD, PhD, FCAP (Electronic Signature on File)     Chemistry      Component Value Date/Time   NA 136 09/08/2014 1038   NA 142 01/21/2013 1157   NA 142 11/19/2012 1750   K 4.7 09/08/2014 1038   K 5.3* 01/21/2013 1157   K 4.3 11/19/2012 1750   CL 101 09/08/2014 1038   CL 103 01/21/2013 1157   CL 109* 11/19/2012 1750   CO2 29 09/08/2014 1038   CO2 27 01/21/2013 1157   CO2 24 11/19/2012 1750   BUN 22 09/08/2014 1038   BUN 32* 01/21/2013 1157   BUN 32* 11/19/2012 1750   CREATININE 1.34* 09/08/2014 1038   CREATININE 1.55* 06/10/2014 1536   CREATININE 1.76* 11/19/2012 1750      Component Value Date/Time   CALCIUM 9.4 09/08/2014 1038   CALCIUM 9.8 01/21/2013 1157   CALCIUM 10.6* 11/19/2012 1750   ALKPHOS 85 09/08/2014 1038   ALKPHOS 67 01/21/2013 1157   ALKPHOS 69 11/19/2012 1750   AST 22 09/08/2014 1038   AST 24 01/21/2013 1157   AST 99* 11/19/2012 1750   ALT 14 09/08/2014 1038   ALT 18 01/21/2013 1157   ALT 52 11/19/2012 1750   BILITOT 1.6* 09/08/2014 1038   BILITOT 1.10 01/21/2013 1157     Impression and Plan: Lisa James is a very pleasant 81 year old female with thrombocytopenia. She continues to do well and stay active. She is asymptomatic at this time.  Today her platelets are 42.  She has had no episodes of bleeding or bruising.  We will give her a an increased dose of Nplate today. This is being adjusted by the pharmacy.  We will plan to see her again in 1 month for labs and follow-up. She is in agreement and knows to call with any questions or concerns and to go to the ED in the event of an emergency. We can certainly see her  sooner if need be.    Eliezer Bottom, NP 5/5/201611:02 AM

## 2014-11-30 ENCOUNTER — Other Ambulatory Visit: Payer: Self-pay | Admitting: Physician Assistant

## 2014-11-30 ENCOUNTER — Other Ambulatory Visit: Payer: Self-pay | Admitting: Emergency Medicine

## 2014-12-06 ENCOUNTER — Ambulatory Visit (INDEPENDENT_AMBULATORY_CARE_PROVIDER_SITE_OTHER): Payer: Medicare Other | Admitting: Emergency Medicine

## 2014-12-06 ENCOUNTER — Encounter: Payer: Self-pay | Admitting: Emergency Medicine

## 2014-12-06 VITALS — BP 126/60 | HR 62 | Temp 98.3°F | Resp 16 | Ht 60.0 in | Wt 116.0 lb

## 2014-12-06 DIAGNOSIS — D696 Thrombocytopenia, unspecified: Secondary | ICD-10-CM

## 2014-12-06 DIAGNOSIS — S065X9A Traumatic subdural hemorrhage with loss of consciousness of unspecified duration, initial encounter: Secondary | ICD-10-CM

## 2014-12-06 DIAGNOSIS — I25810 Atherosclerosis of coronary artery bypass graft(s) without angina pectoris: Secondary | ICD-10-CM | POA: Diagnosis not present

## 2014-12-06 DIAGNOSIS — E119 Type 2 diabetes mellitus without complications: Secondary | ICD-10-CM | POA: Diagnosis not present

## 2014-12-06 DIAGNOSIS — I6200 Nontraumatic subdural hemorrhage, unspecified: Secondary | ICD-10-CM | POA: Diagnosis not present

## 2014-12-06 DIAGNOSIS — S065XAA Traumatic subdural hemorrhage with loss of consciousness status unknown, initial encounter: Secondary | ICD-10-CM

## 2014-12-06 LAB — GLUCOSE, POCT (MANUAL RESULT ENTRY): POC Glucose: 159 mg/dl — AB (ref 70–99)

## 2014-12-06 LAB — POCT GLYCOSYLATED HEMOGLOBIN (HGB A1C): HEMOGLOBIN A1C: 6.9

## 2014-12-06 NOTE — Progress Notes (Addendum)
   Subjective:    Patient ID: Lisa James, female    DOB: Dec 04, 1927, 81 y.o.   MRN: [id number] This chart was scribed for Arlyss Queen, MD by Zola Button, Medical Scribe. This patient was seen in room 22 and the patient's care was started at 2:44 PM.   HPI HPI Comments: Lisa James is a 81 y.o. female with a hx of CAD, DM, non-Hodgkin's lymphoma, dementia, ITP, and subdural hematoma who presents to the [facility] for a complete physical exam. The subdural hematoma has been stable since 2012; she has scans done every 6 months. She has been getting intermittent platelet infusions due to ITP.  She has been doing well overall. Patient stays in assisted living at [facility]. She stays in a room by herself and is able to walk around the building when she wants to. She does not need help with bathing or getting dressed. Things have been going well for her there; she is fed there and feels safe. She denies loss of bladder control.  Patient sees Dr. Marin Olp (oncologist), Dr. Sherwood Gambler (neurosurgeon) and Dr. Claiborne Billings (cardiologist) once a year.  Review of Systems  Genitourinary: Negative for enuresis.       Objective:   Physical Exam CONSTITUTIONAL: Well developed/well nourished HEAD: Normocephalic/atraumatic EYES: She has had bilateral eye surgeries. ENMT: Mucous membranes moist. Old perforation of the right eardrum. NECK: supple no meningeal signs SPINE: entire spine nontender CV: 3/6 systolic murmur at the left sternal border. LUNGS: Lungs are clear to auscultation bilaterally, no apparent distress ABDOMEN: soft, nontender, no rebound or guarding GU: no cva tenderness NEURO: Pt is awake/alert, moves all extremitiesx4 EXTREMITIES: No edema. 1+ pulses on her feet. SKIN: warm, color normal. Healed sternotomy scar. PSYCH: no abnormalities of mood noted  Results for orders placed or performed in visit on 12/06/14  POCT glycosylated hemoglobin (Hb A1C)  Result  Value Ref Range   Hemoglobin A1C 6.9   POCT glucose (manual entry)  Result Value Ref Range   POC Glucose 159 (A) 70 - 99 mg/dl       Assessment & Plan:  1. Diabetes mellitus, stable Hemoglobin A1c is 6.9 and I am happy with this. We absolutely have to avoid hypoglycemia in this patient. - POCT glycosylated hemoglobin (Hb A1C) - POCT glucose (manual entry)  2. Subdural hematoma This has been stable and followed by Dr. Sherwood Gambler  3. Coronary artery disease involving coronary bypass graft of native heart without angina pectoris She denies any chest pain or shortness of breath.  4. Thrombocytopenia She has a history of non-Hodgkin's lymphoma. She is required to have intermittent platelet transfusions. This is follow-through Dr. Tommy Rainwater office.   I personally performed the services described in this documentation, which was scribed in my presence. The recorded information has been reviewed and is accurate.  Arlyss Queen, MD  [facility]  12/06/2014 3:21 PM

## 2014-12-14 ENCOUNTER — Other Ambulatory Visit: Payer: Self-pay | Admitting: Physician Assistant

## 2014-12-14 ENCOUNTER — Other Ambulatory Visit: Payer: Self-pay | Admitting: Cardiovascular Disease

## 2014-12-22 ENCOUNTER — Ambulatory Visit (HOSPITAL_BASED_OUTPATIENT_CLINIC_OR_DEPARTMENT_OTHER): Payer: Medicare Other | Admitting: Hematology & Oncology

## 2014-12-22 ENCOUNTER — Ambulatory Visit (HOSPITAL_BASED_OUTPATIENT_CLINIC_OR_DEPARTMENT_OTHER): Payer: Medicare Other

## 2014-12-22 ENCOUNTER — Encounter: Payer: Self-pay | Admitting: Hematology & Oncology

## 2014-12-22 ENCOUNTER — Other Ambulatory Visit (HOSPITAL_BASED_OUTPATIENT_CLINIC_OR_DEPARTMENT_OTHER): Payer: Medicare Other

## 2014-12-22 VITALS — BP 156/61 | HR 67 | Temp 97.4°F | Resp 16 | Ht 60.0 in | Wt 119.0 lb

## 2014-12-22 DIAGNOSIS — D696 Thrombocytopenia, unspecified: Secondary | ICD-10-CM | POA: Diagnosis not present

## 2014-12-22 DIAGNOSIS — D693 Immune thrombocytopenic purpura: Secondary | ICD-10-CM

## 2014-12-22 LAB — CBC WITH DIFFERENTIAL (CANCER CENTER ONLY)
BASO#: 0 10*3/uL (ref 0.0–0.2)
BASO%: 0.3 % (ref 0.0–2.0)
EOS%: 6.3 % (ref 0.0–7.0)
Eosinophils Absolute: 0.4 10*3/uL (ref 0.0–0.5)
HCT: 35.2 % (ref 34.8–46.6)
HGB: 12.5 g/dL (ref 11.6–15.9)
LYMPH#: 0.9 10*3/uL (ref 0.9–3.3)
LYMPH%: 16.3 % (ref 14.0–48.0)
MCH: 34.9 pg — AB (ref 26.0–34.0)
MCHC: 35.5 g/dL (ref 32.0–36.0)
MCV: 98 fL (ref 81–101)
MONO#: 0.4 10*3/uL (ref 0.1–0.9)
MONO%: 7.2 % (ref 0.0–13.0)
NEUT#: 4 10*3/uL (ref 1.5–6.5)
NEUT%: 69.9 % (ref 39.6–80.0)
Platelets: 41 10*3/uL — ABNORMAL LOW (ref 145–400)
RBC: 3.58 10*6/uL — ABNORMAL LOW (ref 3.70–5.32)
RDW: 12.6 % (ref 11.1–15.7)
WBC: 5.7 10*3/uL (ref 3.9–10.0)

## 2014-12-22 MED ORDER — ROMIPLOSTIM 250 MCG ~~LOC~~ SOLR
3.0000 ug/kg | Freq: Once | SUBCUTANEOUS | Status: AC
  Administered 2014-12-22: 160 ug via SUBCUTANEOUS
  Filled 2014-12-22: qty 0.32

## 2014-12-22 NOTE — Patient Instructions (Signed)
Romiplostim injection What is this medicine? ROMIPLOSTIM (roe mi PLOE stim) helps your body make more platelets. This medicine is used to treat low platelets caused by chronic idiopathic thrombocytopenic purpura (ITP). This medicine may be used for other purposes; ask your health care provider or pharmacist if you have questions. COMMON BRAND NAME(S): Nplate What should I tell my health care provider before I take this medicine? They need to know if you have any of these conditions: -cancer or myelodysplastic syndrome -low blood counts, like low white cell, platelet, or red cell counts -take medicines that treat or prevent blood clots -an unusual or allergic reaction to romiplostim, mannitol, other medicines, foods, dyes, or preservatives -pregnant or trying to get pregnant -breast-feeding How should I use this medicine? This medicine is for injection under the skin. It is given by a health care professional in a hospital or clinic setting. A special MedGuide will be given to you before your injection. Read this information carefully each time. Talk to your pediatrician regarding the use of this medicine in children. Special care may be needed. Overdosage: If you think you have taken too much of this medicine contact a poison control center or emergency room at once. NOTE: This medicine is only for you. Do not share this medicine with others. What if I miss a dose? It is important not to miss your dose. Call your doctor or health care professional if you are unable to keep an appointment. What may interact with this medicine? Interactions are not expected. This list may not describe all possible interactions. Give your health care provider a list of all the medicines, herbs, non-prescription drugs, or dietary supplements you use. Also tell them if you smoke, drink alcohol, or use illegal drugs. Some items may interact with your medicine. What should I watch for while using this  medicine? Your condition will be monitored carefully while you are receiving this medicine. Visit your prescriber or health care professional for regular checks on your progress and for the needed blood tests. It is important to keep all appointments. What side effects may I notice from receiving this medicine? Side effects that you should report to your doctor or health care professional as soon as possible: -allergic reactions like skin rash, itching or hives, swelling of the face, lips, or tongue -shortness of breath, chest pain, swelling in a leg -unusual bleeding or bruising Side effects that usually do not require medical attention (report to your doctor or health care professional if they continue or are bothersome): -dizziness -headache -muscle aches -pain in arms and legs -stomach pain -trouble sleeping This list may not describe all possible side effects. Call your doctor for medical advice about side effects. You may report side effects to FDA at 1-800-FDA-1088. Where should I keep my medicine? This drug is given in a hospital or clinic and will not be stored at home. NOTE: This sheet is a summary. It may not cover all possible information. If you have questions about this medicine, talk to your doctor, pharmacist, or health care provider.  2015, Elsevier/Gold Standard. (2008-03-07 15:13:04)  

## 2014-12-22 NOTE — Progress Notes (Signed)
Hematology and Oncology Follow Up Visit  Lisa James [id number] 1927-10-03 81 y.o. 12/22/2014   Principle Diagnosis:   Thrombocytopenia-likely immune thrombocytopenia  Current Therapy:    Nplate as indicated - last dose given on 09/01/2014 at 3 mg/kg     Interim History:  Ms.  James is back for followup. She looks great. She's had no problems with bleeding or bruising.  She had a very nice 87th birthday. She celebrated this back in March.  She's had no problems with headache. She's had no visual issues. She's had no nausea or vomiting. She's had no leg swelling.  She did have a nice Memorial Day weekend.  Overall, her performance status is ECOG 2.   Medications:  Current outpatient prescriptions:  .  atorvastatin (LIPITOR) 20 MG tablet, TAKE 1 TABLET BY MOUTH EVERY DAY, Disp: 30 tablet, Rfl: 0 .  Calcium Carbonate-Vitamin D (CALCIUM-VITAMIN D) 500-200 MG-UNIT per tablet, Take 1 tablet by mouth daily. , Disp: , Rfl:  .  carvedilol (COREG) 6.25 MG tablet, TAKE 1 TABLET BY MOUTH TWICE A DAY WITH A MEAL, Disp: 60 tablet, Rfl: 0 .  Cholecalciferol (VITAMIN D) 1000 UNITS capsule, Take 1,000 Units by mouth at bedtime. , Disp: , Rfl:  .  furosemide (LASIX) 20 MG tablet, Take 1 tablet (20 mg total) by mouth daily. PATIENT NEEDS OFFICE VISIT FOR ADDITIONAL REFILLS, Disp: 30 tablet, Rfl: 0 .  furosemide (LASIX) 20 MG tablet, TAKE 1 TABLET (20 MG TOTAL) BY MOUTH DAILY. PATIENT NEEDS OFFICE VISIT FOR ADDITIONAL REFILLS, Disp: 30 tablet, Rfl: 0 .  furosemide (LASIX) 20 MG tablet, TAKE 1 TABLET BY MOUTH EVERY DAY, Disp: 30 tablet, Rfl: 0 .  irbesartan (AVAPRO) 300 MG tablet, TAKE 1 TABLET BY MOUTH EVERY DAY, Disp: 30 tablet, Rfl: 10 .  KLOR-CON M20 20 MEQ tablet, TAKE 1 TABLET EVERY DAY *NEEDS OFFICE VISIT FOR MORE REFILLS*, Disp: 30 tablet, Rfl: 0 .  KLOR-CON M20 20 MEQ tablet, TAKE 1 TABLET (20 MEQ TOTAL) BY MOUTH DAILY., Disp: 30 tablet, Rfl: 0 .  Multiple Vitamins-Minerals  (PRESERVISION AREDS PO), Take 1 tablet by mouth daily., Disp: , Rfl:  .  romiPLOStim (NPLATE) 250 MCG injection, Inject 1 mcg/kg into the skin as needed (every 2-3 weeks for low platelets). Given @ Oncology, Disp: , Rfl:  .  sertraline (ZOLOFT) 25 MG tablet, TAKE 1 TABLET (25 MG TOTAL) BY MOUTH DAILY. PATIENT NEEDS OFFICE VISIT FOR ADDITIONAL REFILLS, Disp: 30 tablet, Rfl: 0 .  sertraline (ZOLOFT) 25 MG tablet, TAKE 1 TABLET (25 MG TOTAL) BY MOUTH DAILY., Disp: 30 tablet, Rfl: 0 .  spironolactone (ALDACTONE) 25 MG tablet, Take 25 mg by mouth daily., Disp: , Rfl:   Allergies:  Allergies  Allergen Reactions  . Ace Inhibitors Cough    Past Medical History, Surgical history, Social history, and Family History were reviewed and updated.  Review of Systems: As above  Physical Exam:  height is 5' (1.524 m) and weight is 119 lb (53.978 kg). Her oral temperature is 97.4 F (36.3 C). Her blood pressure is 156/61 and her pulse is 67. Her respiration is 16.   Totally, petite white female in no obvious distress. Head and neck exam shows no ocular or oral lesions. Pupils reacted properly. Extraocular muscles are intact. There is no adenopathy in the neck. Lungs are clear. Cardiac exam regular and rhythm . She is a 1/6 systolic ejection murmur. Abdomen is soft. She has good bowel sounds. There is no fluid wave. There  is no palpable liver or spleen tip. Back exam shows no tenderness over the spine. She has some slight kyphosis. Extremities shows no clubbing cyanosis or edema. His age related osteoarthritic changes. She has good strength bilaterally. Neurological exam is without any deficits.  Lab Results  Component Value Date   WBC 5.7 12/22/2014   HGB 12.5 12/22/2014   HCT 35.2 12/22/2014   MCV 98 12/22/2014   PLT 41 Platelet count consistent in citrate* 12/22/2014     Chemistry      Component Value Date/Time   NA 136 09/08/2014 1038   NA 142 01/21/2013 1157   NA 142 11/19/2012 1750   K 4.7  09/08/2014 1038   K 5.3* 01/21/2013 1157   K 4.3 11/19/2012 1750   CL 101 09/08/2014 1038   CL 103 01/21/2013 1157   CL 109* 11/19/2012 1750   CO2 29 09/08/2014 1038   CO2 27 01/21/2013 1157   CO2 24 11/19/2012 1750   BUN 22 09/08/2014 1038   BUN 32* 01/21/2013 1157   BUN 32* 11/19/2012 1750   CREATININE 1.34* 09/08/2014 1038   CREATININE 1.55* 06/10/2014 1536   CREATININE 1.76* 11/19/2012 1750      Component Value Date/Time   CALCIUM 9.4 09/08/2014 1038   CALCIUM 9.8 01/21/2013 1157   CALCIUM 10.6* 11/19/2012 1750   ALKPHOS 85 09/08/2014 1038   ALKPHOS 67 01/21/2013 1157   ALKPHOS 69 11/19/2012 1750   AST 22 09/08/2014 1038   AST 24 01/21/2013 1157   AST 99* 11/19/2012 1750   ALT 14 09/08/2014 1038   ALT 18 01/21/2013 1157   ALT 52 11/19/2012 1750   BILITOT 1.6* 09/08/2014 1038   BILITOT 1.10 01/21/2013 1157         Impression and Plan: Lisa James is a 81 year old female with thrombocytopenia. This is likely immune thrombocytopenia. I actually did do a bone marrow on her last year.  She responds well to Nplate. We will go ahead and give her Nplate today.  I want to get her back in 3 weeks. We will see what her platelet count is at that point.     Volanda Napoleon, MD 6/2/20161:40 PM

## 2014-12-30 ENCOUNTER — Other Ambulatory Visit: Payer: Self-pay | Admitting: Neurosurgery

## 2014-12-30 DIAGNOSIS — S065XAA Traumatic subdural hemorrhage with loss of consciousness status unknown, initial encounter: Secondary | ICD-10-CM

## 2014-12-30 DIAGNOSIS — S065X9A Traumatic subdural hemorrhage with loss of consciousness of unspecified duration, initial encounter: Secondary | ICD-10-CM

## 2015-01-05 ENCOUNTER — Other Ambulatory Visit: Payer: Self-pay | Admitting: Emergency Medicine

## 2015-01-10 ENCOUNTER — Other Ambulatory Visit: Payer: Self-pay | Admitting: Emergency Medicine

## 2015-01-12 ENCOUNTER — Ambulatory Visit (HOSPITAL_BASED_OUTPATIENT_CLINIC_OR_DEPARTMENT_OTHER): Payer: Medicare Other | Admitting: Family

## 2015-01-12 ENCOUNTER — Other Ambulatory Visit (HOSPITAL_BASED_OUTPATIENT_CLINIC_OR_DEPARTMENT_OTHER): Payer: Medicare Other

## 2015-01-12 ENCOUNTER — Encounter: Payer: Self-pay | Admitting: Family

## 2015-01-12 ENCOUNTER — Ambulatory Visit (HOSPITAL_BASED_OUTPATIENT_CLINIC_OR_DEPARTMENT_OTHER): Payer: Medicare Other

## 2015-01-12 VITALS — BP 124/43 | HR 57 | Temp 97.4°F | Resp 18 | Wt 118.4 lb

## 2015-01-12 DIAGNOSIS — D696 Thrombocytopenia, unspecified: Secondary | ICD-10-CM

## 2015-01-12 DIAGNOSIS — D693 Immune thrombocytopenic purpura: Secondary | ICD-10-CM

## 2015-01-12 LAB — CBC WITH DIFFERENTIAL (CANCER CENTER ONLY)
BASO#: 0 10*3/uL (ref 0.0–0.2)
BASO%: 0.6 % (ref 0.0–2.0)
EOS%: 7.7 % — AB (ref 0.0–7.0)
Eosinophils Absolute: 0.4 10*3/uL (ref 0.0–0.5)
HCT: 37.7 % (ref 34.8–46.6)
HGB: 13.3 g/dL (ref 11.6–15.9)
LYMPH#: 0.9 10*3/uL (ref 0.9–3.3)
LYMPH%: 16.7 % (ref 14.0–48.0)
MCH: 34.9 pg — ABNORMAL HIGH (ref 26.0–34.0)
MCHC: 35.3 g/dL (ref 32.0–36.0)
MCV: 99 fL (ref 81–101)
MONO#: 0.4 10*3/uL (ref 0.1–0.9)
MONO%: 7.7 % (ref 0.0–13.0)
NEUT%: 67.3 % (ref 39.6–80.0)
NEUTROS ABS: 3.6 10*3/uL (ref 1.5–6.5)
Platelets: 124 10*3/uL — ABNORMAL LOW (ref 145–400)
RBC: 3.81 10*6/uL (ref 3.70–5.32)
RDW: 12.4 % (ref 11.1–15.7)
WBC: 5.3 10*3/uL (ref 3.9–10.0)

## 2015-01-12 LAB — CHCC SATELLITE - SMEAR

## 2015-01-12 MED ORDER — ROMIPLOSTIM 250 MCG ~~LOC~~ SOLR
3.0000 ug/kg | Freq: Once | SUBCUTANEOUS | Status: AC
  Administered 2015-01-12: 160 ug via SUBCUTANEOUS
  Filled 2015-01-12: qty 0.32

## 2015-01-12 NOTE — Patient Instructions (Signed)
Romiplostim injection What is this medicine? ROMIPLOSTIM (roe mi PLOE stim) helps your body make more platelets. This medicine is used to treat low platelets caused by chronic idiopathic thrombocytopenic purpura (ITP). This medicine may be used for other purposes; ask your health care provider or pharmacist if you have questions. COMMON BRAND NAME(S): Nplate What should I tell my health care provider before I take this medicine? They need to know if you have any of these conditions: -cancer or myelodysplastic syndrome -low blood counts, like low white cell, platelet, or red cell counts -take medicines that treat or prevent blood clots -an unusual or allergic reaction to romiplostim, mannitol, other medicines, foods, dyes, or preservatives -pregnant or trying to get pregnant -breast-feeding How should I use this medicine? This medicine is for injection under the skin. It is given by a health care professional in a hospital or clinic setting. A special MedGuide will be given to you before your injection. Read this information carefully each time. Talk to your pediatrician regarding the use of this medicine in children. Special care may be needed. Overdosage: If you think you have taken too much of this medicine contact a poison control center or emergency room at once. NOTE: This medicine is only for you. Do not share this medicine with others. What if I miss a dose? It is important not to miss your dose. Call your doctor or health care professional if you are unable to keep an appointment. What may interact with this medicine? Interactions are not expected. This list may not describe all possible interactions. Give your health care provider a list of all the medicines, herbs, non-prescription drugs, or dietary supplements you use. Also tell them if you smoke, drink alcohol, or use illegal drugs. Some items may interact with your medicine. What should I watch for while using this  medicine? Your condition will be monitored carefully while you are receiving this medicine. Visit your prescriber or health care professional for regular checks on your progress and for the needed blood tests. It is important to keep all appointments. What side effects may I notice from receiving this medicine? Side effects that you should report to your doctor or health care professional as soon as possible: -allergic reactions like skin rash, itching or hives, swelling of the face, lips, or tongue -shortness of breath, chest pain, swelling in a leg -unusual bleeding or bruising Side effects that usually do not require medical attention (report to your doctor or health care professional if they continue or are bothersome): -dizziness -headache -muscle aches -pain in arms and legs -stomach pain -trouble sleeping This list may not describe all possible side effects. Call your doctor for medical advice about side effects. You may report side effects to FDA at 1-800-FDA-1088. Where should I keep my medicine? This drug is given in a hospital or clinic and will not be stored at home. NOTE: This sheet is a summary. It may not cover all possible information. If you have questions about this medicine, talk to your doctor, pharmacist, or health care provider.  2015, Elsevier/Gold Standard. (2008-03-07 15:13:04)  

## 2015-01-12 NOTE — Progress Notes (Signed)
Hematology and Oncology Follow Up Visit  Lisa James [id number] 12/04/1927 81 y.o. 01/12/2015   Principle Diagnosis:  Thrombocytopenia-likely immune thrombocytopenia  Current Therapy:   Nplate as indicated - last dose given on 10/27/14    Interim History: Lisa James is here today for a follow-up. She is doing well and has not had any problems since we saw her 3 weeks ago.  She has had no episodes of bruising or bleeding. Her platelet count is up to 124.  She denies fatigue, fever, chills, n/v, cough, rash, headache, dizziness, SOB, chest pain, palpitations, abdominal pain, constipation, diarrhea, blood in urine or stool. No episodes of bruising or bleeding.  She denies swelling, tenderness, numbness or tingling in her extremities. No new aches or pains.  Her appetite is good and she is drinking plenty of fluids. Her weight is stable. She loves the assisted living facility where she lives. She says that there are plenty of activities to do each day. Her niece still visits her daily and the walk together in the evenings.   Medications:    Medication List       This list is accurate as of: 01/12/15 11:42 AM.  Always use your most recent med list.               atorvastatin 20 MG tablet  Commonly known as:  LIPITOR  TAKE 1 TABLET BY MOUTH EVERY DAY.     calcium-vitamin D 500-200 MG-UNIT per tablet  Take 1 tablet by mouth daily.     carvedilol 6.25 MG tablet  Commonly known as:  COREG  TAKE 1 TABLET TWICE A DAY WITH A MEAL.     furosemide 20 MG tablet  Commonly known as:  LASIX  TAKE 1 TABLET BY MOUTH EVERY DAY. "OV NEEDED FOR ADDITIONAL REFILLS"     irbesartan 300 MG tablet  Commonly known as:  AVAPRO  TAKE 1 TABLET BY MOUTH EVERY DAY     KLOR-CON M20 20 MEQ tablet  Generic drug:  potassium chloride SA  TAKE 1 TABLET EVERY DAY *NEEDS OFFICE VISIT FOR MORE REFILLS*     KLOR-CON M20 20 MEQ tablet  Generic drug:  potassium chloride SA  TAKE 1 TABLET (20 MEQ TOTAL)  BY MOUTH DAILY.     PRESERVISION AREDS PO  Take 1 tablet by mouth daily.     romiPLOStim 250 MCG injection  Commonly known as:  NPLATE  Inject 1 mcg/kg into the skin as needed (every 2-3 weeks for low platelets). Given @ Oncology     sertraline 25 MG tablet  Commonly known as:  ZOLOFT  TAKE 1 TABLET BY MOUTH EVERY DAY.  "OV NEEDED FOR ADDITIONAL REFILLS"     spironolactone 25 MG tablet  Commonly known as:  ALDACTONE  Take 25 mg by mouth daily.     Vitamin D 1000 UNITS capsule  Take 1,000 Units by mouth at bedtime.        Allergies:  Allergies  Allergen Reactions  . Ace Inhibitors Cough    Past Medical History, Surgical history, Social history, and Family History were reviewed and updated.  Review of Systems: All other 10 point review of systems is negative.   Physical Exam:  weight is 118 lb 6.4 oz (53.706 kg). Her oral temperature is 97.4 F (36.3 C). Her blood pressure is 124/43 and her pulse is 57. Her respiration is 18.   Wt Readings from Last 3 Encounters:  01/12/15 118 lb 6.4 oz (53.706 kg)  12/22/14 119 lb (53.978 kg)  12/06/14 116 lb (52.617 kg)    Ocular: Sclerae unicteric, pupils equal, round and reactive to light Ear-nose-throat: Oropharynx clear, dentition fair Lymphatic: No cervical or supraclavicular adenopathy Lungs no rales or rhonchi, good excursion bilaterally Heart regular rate and rhythm, no murmur appreciated Abd soft, nontender, positive bowel sounds MSK no focal spinal tenderness, no joint edema Neuro: non-focal, well-oriented, appropriate affect Breasts: Deferred  Lab Results  Component Value Date   WBC 5.3 01/12/2015   HGB 13.3 01/12/2015   HCT 37.7 01/12/2015   MCV 99 01/12/2015   PLT 124* 01/12/2015   No results found for: FERRITIN, IRON, TIBC, UIBC, IRONPCTSAT Lab Results  Component Value Date   RETICCTPCT 2.6 05/13/2011   RBC 3.81 01/12/2015   No results found for: [facility]ils Pyle [facility] Lab Results    Component Value Date   IGGSERUM 720 09/29/2012   IGA 115 09/29/2012   IGMSERUM 73 09/29/2012   Lab Results  Component Value Date   TOTALPROTELP 6.9 09/29/2012   TOTALPROTELP 6.9 09/29/2012   ALBUMINELP 63.4 09/29/2012   A1GS 4.3 09/29/2012   A2GS 12.3* 09/29/2012   BETS 7.2 09/29/2012   BETA2SER 3.6 09/29/2012   GAMS 9.2* 09/29/2012   MSPIKE NOT DET 09/29/2012   SPEI  09/29/2012     Comment:     Nonspecific pattern associated with slightly decreased gamma globulins. Reviewed by Odis Hollingshead, MD, PhD, FCAP (Electronic Signature on File)     Chemistry      Component Value Date/Time   NA 136 09/08/2014 1038   NA 142 01/21/2013 1157   NA 142 11/19/2012 1750   K 4.7 09/08/2014 1038   K 5.3* 01/21/2013 1157   K 4.3 11/19/2012 1750   CL 101 09/08/2014 1038   CL 103 01/21/2013 1157   CL 109* 11/19/2012 1750   CO2 29 09/08/2014 1038   CO2 27 01/21/2013 1157   CO2 24 11/19/2012 1750   BUN 22 09/08/2014 1038   BUN 32* 01/21/2013 1157   BUN 32* 11/19/2012 1750   CREATININE 1.34* 09/08/2014 1038   CREATININE 1.55* 06/10/2014 1536   CREATININE 1.76* 11/19/2012 1750      Component Value Date/Time   CALCIUM 9.4 09/08/2014 1038   CALCIUM 9.8 01/21/2013 1157   CALCIUM 10.6* 11/19/2012 1750   ALKPHOS 85 09/08/2014 1038   ALKPHOS 67 01/21/2013 1157   ALKPHOS 69 11/19/2012 1750   AST 22 09/08/2014 1038   AST 24 01/21/2013 1157   AST 99* 11/19/2012 1750   ALT 14 09/08/2014 1038   ALT 18 01/21/2013 1157   ALT 52 11/19/2012 1750   BILITOT 1.6* 09/08/2014 1038   BILITOT 1.10 01/21/2013 1157     Impression and Plan: Lisa James is a very pleasant 81 year old female with thrombocytopenia. So far this has not been a problem for her. She is asymptomatic at this time.  Her platelet count is up to 124. We will give her Nplate today.  We will plan to see her again in 3 weeks for labs and follow-up. She knows to contact us with any questions or concerns. We can certainly see  her sooner if need be.    Eliezer Bottom, NP 6/23/201611:42 AM

## 2015-01-16 ENCOUNTER — Other Ambulatory Visit: Payer: Self-pay

## 2015-01-17 ENCOUNTER — Other Ambulatory Visit: Payer: Medicare Other

## 2015-01-31 ENCOUNTER — Ambulatory Visit
Admission: RE | Admit: 2015-01-31 | Discharge: 2015-01-31 | Disposition: A | Discharge status: Home / Self Care | Payer: Medicare Other | Source: Ambulatory Visit | Attending: Neurosurgery | Admitting: Neurosurgery

## 2015-01-31 DIAGNOSIS — S065XAA Traumatic subdural hemorrhage with loss of consciousness status unknown, initial encounter: Secondary | ICD-10-CM

## 2015-01-31 DIAGNOSIS — S065X9A Traumatic subdural hemorrhage with loss of consciousness of unspecified duration, initial encounter: Secondary | ICD-10-CM

## 2015-02-02 ENCOUNTER — Ambulatory Visit (HOSPITAL_BASED_OUTPATIENT_CLINIC_OR_DEPARTMENT_OTHER): Payer: Medicare Other

## 2015-02-02 ENCOUNTER — Ambulatory Visit (HOSPITAL_BASED_OUTPATIENT_CLINIC_OR_DEPARTMENT_OTHER): Payer: Medicare Other | Admitting: Family

## 2015-02-02 ENCOUNTER — Other Ambulatory Visit (HOSPITAL_BASED_OUTPATIENT_CLINIC_OR_DEPARTMENT_OTHER): Payer: Medicare Other

## 2015-02-02 ENCOUNTER — Encounter: Payer: Self-pay | Admitting: Family

## 2015-02-02 VITALS — BP 168/56 | HR 67 | Temp 98.0°F | Resp 16 | Ht 62.0 in | Wt 116.0 lb

## 2015-02-02 DIAGNOSIS — D696 Thrombocytopenia, unspecified: Secondary | ICD-10-CM | POA: Diagnosis not present

## 2015-02-02 DIAGNOSIS — D693 Immune thrombocytopenic purpura: Secondary | ICD-10-CM

## 2015-02-02 LAB — CBC WITH DIFFERENTIAL (CANCER CENTER ONLY)
BASO#: 0 10*3/uL (ref 0.0–0.2)
BASO%: 0.3 % (ref 0.0–2.0)
EOS%: 4.9 % (ref 0.0–7.0)
Eosinophils Absolute: 0.5 10*3/uL (ref 0.0–0.5)
HEMATOCRIT: 37.3 % (ref 34.8–46.6)
HGB: 13.2 g/dL (ref 11.6–15.9)
LYMPH#: 1.3 10*3/uL (ref 0.9–3.3)
LYMPH%: 11.8 % — ABNORMAL LOW (ref 14.0–48.0)
MCH: 35.1 pg — ABNORMAL HIGH (ref 26.0–34.0)
MCHC: 35.4 g/dL (ref 32.0–36.0)
MCV: 99 fL (ref 81–101)
MONO#: 1 10*3/uL — AB (ref 0.1–0.9)
MONO%: 9.3 % (ref 0.0–13.0)
NEUT%: 73.7 % (ref 39.6–80.0)
NEUTROS ABS: 7.9 10*3/uL — AB (ref 1.5–6.5)
Platelets: 113 10*3/uL — ABNORMAL LOW (ref 145–400)
RBC: 3.76 10*6/uL (ref 3.70–5.32)
RDW: 12.3 % (ref 11.1–15.7)
WBC: 10.7 10*3/uL — AB (ref 3.9–10.0)

## 2015-02-02 LAB — CHCC SATELLITE - SMEAR

## 2015-02-02 MED ORDER — ROMIPLOSTIM 250 MCG ~~LOC~~ SOLR
3.0000 ug/kg | Freq: Once | SUBCUTANEOUS | Status: DC
  Administered 2015-02-02: 160 ug via SUBCUTANEOUS
  Filled 2015-02-02: qty 0.32

## 2015-02-02 NOTE — Patient Instructions (Signed)
Romiplostim injection What is this medicine? ROMIPLOSTIM (roe mi PLOE stim) helps your body make more platelets. This medicine is used to treat low platelets caused by chronic idiopathic thrombocytopenic purpura (ITP). This medicine may be used for other purposes; ask your health care provider or pharmacist if you have questions. COMMON BRAND NAME(S): Nplate What should I tell my health care provider before I take this medicine? They need to know if you have any of these conditions: -cancer or myelodysplastic syndrome -low blood counts, like low white cell, platelet, or red cell counts -take medicines that treat or prevent blood clots -an unusual or allergic reaction to romiplostim, mannitol, other medicines, foods, dyes, or preservatives -pregnant or trying to get pregnant -breast-feeding How should I use this medicine? This medicine is for injection under the skin. It is given by a health care professional in a hospital or clinic setting. A special MedGuide will be given to you before your injection. Read this information carefully each time. Talk to your pediatrician regarding the use of this medicine in children. Special care may be needed. Overdosage: If you think you have taken too much of this medicine contact a poison control center or emergency room at once. NOTE: This medicine is only for you. Do not share this medicine with others. What if I miss a dose? It is important not to miss your dose. Call your doctor or health care professional if you are unable to keep an appointment. What may interact with this medicine? Interactions are not expected. This list may not describe all possible interactions. Give your health care provider a list of all the medicines, herbs, non-prescription drugs, or dietary supplements you use. Also tell them if you smoke, drink alcohol, or use illegal drugs. Some items may interact with your medicine. What should I watch for while using this  medicine? Your condition will be monitored carefully while you are receiving this medicine. Visit your prescriber or health care professional for regular checks on your progress and for the needed blood tests. It is important to keep all appointments. What side effects may I notice from receiving this medicine? Side effects that you should report to your doctor or health care professional as soon as possible: -allergic reactions like skin rash, itching or hives, swelling of the face, lips, or tongue -shortness of breath, chest pain, swelling in a leg -unusual bleeding or bruising Side effects that usually do not require medical attention (report to your doctor or health care professional if they continue or are bothersome): -dizziness -headache -muscle aches -pain in arms and legs -stomach pain -trouble sleeping This list may not describe all possible side effects. Call your doctor for medical advice about side effects. You may report side effects to FDA at 1-800-FDA-1088. Where should I keep my medicine? This drug is given in a hospital or clinic and will not be stored at home. NOTE: This sheet is a summary. It may not cover all possible information. If you have questions about this medicine, talk to your doctor, pharmacist, or health care provider.  2015, Elsevier/Gold Standard. (2008-03-07 15:13:04)  

## 2015-02-02 NOTE — Progress Notes (Signed)
Hematology and Oncology Follow Up Visit  Lisa James [id number] 29-Jan-1928 81 y.o. 02/02/2015   Principle Diagnosis:  Thrombocytopenia-likely immune thrombocytopenia  Current Therapy:   Nplate as indicated - last dose given on 10/27/14    Interim History: Lisa James is here today for a follow-up. She is doing well and staying active as usual. She walks around her assisted living facility 3 times each day and enjoys participating in the various activities they have there.  She has had no episodes of bruising or bleeding. Her platelet count is 113 today so she will get a dose of Nplate.  She denies fatigue, fever, chills, n/v, cough, rash, headache, dizziness, SOB, chest pain, palpitations, abdominal pain, changes in her bowel or bladder habits. No blood in her urine or stool.  She denies swelling, tenderness, numbness or tingling in her extremities. No new aches or pains.  She has a healthy appetite and is staying well hydrated. She is down 2 lbs since her last visit. We will continue to monitor this.   Medications:    Medication List       This list is accurate as of: 02/02/15  9:55 AM.  Always use your most recent med list.               atorvastatin 20 MG tablet  Commonly known as:  LIPITOR  TAKE 1 TABLET BY MOUTH EVERY DAY.     calcium-vitamin D 500-200 MG-UNIT per tablet  Take 1 tablet by mouth daily.     carvedilol 6.25 MG tablet  Commonly known as:  COREG  TAKE 1 TABLET TWICE A DAY WITH A MEAL.     furosemide 20 MG tablet  Commonly known as:  LASIX  TAKE 1 TABLET BY MOUTH EVERY DAY. "OV NEEDED FOR ADDITIONAL REFILLS"     irbesartan 300 MG tablet  Commonly known as:  AVAPRO  TAKE 1 TABLET BY MOUTH EVERY DAY     KLOR-CON M20 20 MEQ tablet  Generic drug:  potassium chloride SA  TAKE 1 TABLET EVERY DAY *NEEDS OFFICE VISIT FOR MORE REFILLS*     KLOR-CON M20 20 MEQ tablet  Generic drug:  potassium chloride SA  TAKE 1 TABLET (20 MEQ TOTAL) BY MOUTH DAILY.       PRESERVISION AREDS PO  Take 1 tablet by mouth daily.     romiPLOStim 250 MCG injection  Commonly known as:  NPLATE  Inject 1 mcg/kg into the skin as needed (every 2-3 weeks for low platelets). Given @ Oncology     sertraline 25 MG tablet  Commonly known as:  ZOLOFT  TAKE 1 TABLET BY MOUTH EVERY DAY.  "OV NEEDED FOR ADDITIONAL REFILLS"     spironolactone 25 MG tablet  Commonly known as:  ALDACTONE  Take 25 mg by mouth daily.     Vitamin D 1000 UNITS capsule  Take 1,000 Units by mouth at bedtime.        Allergies:  Allergies  Allergen Reactions  . Ace Inhibitors Cough    Past Medical History, Surgical history, Social history, and Family History were reviewed and updated.  Review of Systems: All other 10 James review of systems is negative.   Physical Exam:  vitals were not taken for this visit.  Wt Readings from Last 3 Encounters:  01/12/15 118 lb 6.4 oz (53.706 kg)  12/22/14 119 lb (53.978 kg)  12/06/14 116 lb (52.617 kg)    Ocular: Sclerae unicteric, pupils equal, round and reactive to light Ear-nose-throat:  Oropharynx clear, dentition fair Lymphatic: No cervical or supraclavicular adenopathy Lungs no rales or rhonchi, good excursion bilaterally Heart regular rate and rhythm, no murmur appreciated Abd soft, nontender, positive bowel sounds MSK no focal spinal tenderness, no joint edema Neuro: non-focal, well-oriented, appropriate affect Breasts: Deferred  Lab Results  Component Value Date   WBC 10.7* 02/02/2015   HGB 13.2 02/02/2015   HCT 37.3 02/02/2015   MCV 99 02/02/2015   PLT 113 Platelet count consistent in citrate* 02/02/2015   No results found for: FERRITIN, IRON, TIBC, UIBC, IRONPCTSAT Lab Results  Component Value Date   RETICCTPCT 2.6 05/13/2011   RBC 3.76 02/02/2015   No results found for: [facility]ils Pyle [facility] Lab Results  Component Value Date   IGGSERUM 720 09/29/2012   IGA 115 09/29/2012   IGMSERUM 73 09/29/2012    Lab Results  Component Value Date   TOTALPROTELP 6.9 09/29/2012   TOTALPROTELP 6.9 09/29/2012   ALBUMINELP 63.4 09/29/2012   A1GS 4.3 09/29/2012   A2GS 12.3* 09/29/2012   BETS 7.2 09/29/2012   BETA2SER 3.6 09/29/2012   GAMS 9.2* 09/29/2012   MSPIKE NOT DET 09/29/2012   SPEI  09/29/2012     Comment:     Nonspecific pattern associated with slightly decreased gamma globulins. Reviewed by Odis Hollingshead, MD, PhD, FCAP (Electronic Signature on File)     Chemistry      Component Value Date/Time   NA 136 09/08/2014 1038   NA 142 01/21/2013 1157   NA 142 11/19/2012 1750   K 4.7 09/08/2014 1038   K 5.3* 01/21/2013 1157   K 4.3 11/19/2012 1750   CL 101 09/08/2014 1038   CL 103 01/21/2013 1157   CL 109* 11/19/2012 1750   CO2 29 09/08/2014 1038   CO2 27 01/21/2013 1157   CO2 24 11/19/2012 1750   BUN 22 09/08/2014 1038   BUN 32* 01/21/2013 1157   BUN 32* 11/19/2012 1750   CREATININE 1.34* 09/08/2014 1038   CREATININE 1.55* 06/10/2014 1536   CREATININE 1.76* 11/19/2012 1750      Component Value Date/Time   CALCIUM 9.4 09/08/2014 1038   CALCIUM 9.8 01/21/2013 1157   CALCIUM 10.6* 11/19/2012 1750   ALKPHOS 85 09/08/2014 1038   ALKPHOS 67 01/21/2013 1157   ALKPHOS 69 11/19/2012 1750   AST 22 09/08/2014 1038   AST 24 01/21/2013 1157   AST 99* 11/19/2012 1750   ALT 14 09/08/2014 1038   ALT 18 01/21/2013 1157   ALT 52 11/19/2012 1750   BILITOT 1.6* 09/08/2014 1038   BILITOT 1.10 01/21/2013 1157     Impression and Plan: Lisa James is a very pleasant 81 year old female with thrombocytopenia. So far this has not been a problem for her. She is asymptomatic and doing well at this time.  There have been no episodes of bleeding or bruising. Her platelet count is 113 so we will give her a dose of Nplate today. We will plan to see her again in 3 weeks for labs and follow-up. She knows to contact us with any questions or concerns. We can certainly see her sooner if need be.     Eliezer Bottom, NP 7/14/20169:55 AM

## 2015-02-06 ENCOUNTER — Other Ambulatory Visit: Payer: Self-pay | Admitting: Physician Assistant

## 2015-02-07 ENCOUNTER — Other Ambulatory Visit: Payer: Self-pay | Admitting: Emergency Medicine

## 2015-02-07 ENCOUNTER — Other Ambulatory Visit: Payer: Self-pay | Admitting: Physician Assistant

## 2015-02-07 NOTE — Telephone Encounter (Signed)
Dr Everlene Farrier, you just saw pt for CPE in May, but don't see Lasix discussed. Can we RF?

## 2015-02-09 ENCOUNTER — Telehealth: Payer: Self-pay | Admitting: Emergency Medicine

## 2015-02-09 NOTE — Telephone Encounter (Signed)
Dr. Everlene Farrier do you want pt to continue potassium.

## 2015-02-09 NOTE — Telephone Encounter (Signed)
Please call the patient or better yet the patient's daughter and give her an appointment time to see me maybe next Thursday or the following Thursday so I can do a potassium level on her. I did refill her potassium

## 2015-02-09 NOTE — Telephone Encounter (Signed)
Call daughter

## 2015-02-10 ENCOUNTER — Telehealth: Payer: Self-pay | Admitting: Emergency Medicine

## 2015-02-10 NOTE — Telephone Encounter (Signed)
Left message on niece's voicemail.

## 2015-02-10 NOTE — Telephone Encounter (Signed)
Please call in that and be sure she has been advised to bring [facility]  in for a serum potassium in the near future

## 2015-02-13 NOTE — Telephone Encounter (Signed)
Erroneous encounter

## 2015-02-23 ENCOUNTER — Ambulatory Visit (HOSPITAL_BASED_OUTPATIENT_CLINIC_OR_DEPARTMENT_OTHER): Payer: Medicare Other | Admitting: Hematology & Oncology

## 2015-02-23 ENCOUNTER — Encounter: Payer: Self-pay | Admitting: Hematology & Oncology

## 2015-02-23 ENCOUNTER — Other Ambulatory Visit (HOSPITAL_BASED_OUTPATIENT_CLINIC_OR_DEPARTMENT_OTHER): Payer: Medicare Other

## 2015-02-23 ENCOUNTER — Ambulatory Visit (HOSPITAL_BASED_OUTPATIENT_CLINIC_OR_DEPARTMENT_OTHER): Payer: Medicare Other

## 2015-02-23 VITALS — BP 150/51 | HR 62 | Temp 97.9°F | Resp 16 | Ht 62.0 in | Wt 116.0 lb

## 2015-02-23 DIAGNOSIS — D696 Thrombocytopenia, unspecified: Secondary | ICD-10-CM

## 2015-02-23 DIAGNOSIS — D693 Immune thrombocytopenic purpura: Secondary | ICD-10-CM

## 2015-02-23 LAB — CBC WITH DIFFERENTIAL (CANCER CENTER ONLY)
BASO#: 0 10*3/uL (ref 0.0–0.2)
BASO%: 0.6 % (ref 0.0–2.0)
EOS%: 8.1 % — ABNORMAL HIGH (ref 0.0–7.0)
Eosinophils Absolute: 0.6 10*3/uL — ABNORMAL HIGH (ref 0.0–0.5)
HCT: 36.4 % (ref 34.8–46.6)
HGB: 12.8 g/dL (ref 11.6–15.9)
LYMPH#: 1.1 10*3/uL (ref 0.9–3.3)
LYMPH%: 16.2 % (ref 14.0–48.0)
MCH: 34.7 pg — AB (ref 26.0–34.0)
MCHC: 35.2 g/dL (ref 32.0–36.0)
MCV: 99 fL (ref 81–101)
MONO#: 0.6 10*3/uL (ref 0.1–0.9)
MONO%: 8.5 % (ref 0.0–13.0)
NEUT%: 66.6 % (ref 39.6–80.0)
NEUTROS ABS: 4.6 10*3/uL (ref 1.5–6.5)
Platelets: 92 10*3/uL — ABNORMAL LOW (ref 145–400)
RBC: 3.69 10*6/uL — AB (ref 3.70–5.32)
RDW: 12.1 % (ref 11.1–15.7)
WBC: 6.9 10*3/uL (ref 3.9–10.0)

## 2015-02-23 LAB — CHCC SATELLITE - SMEAR

## 2015-02-23 LAB — TECHNOLOGIST REVIEW CHCC SATELLITE

## 2015-02-23 MED ORDER — ROMIPLOSTIM 250 MCG ~~LOC~~ SOLR
3.0000 ug/kg | Freq: Once | SUBCUTANEOUS | Status: AC
  Administered 2015-02-23: 160 ug via SUBCUTANEOUS
  Filled 2015-02-23: qty 0.32

## 2015-02-23 NOTE — Patient Instructions (Signed)
Romiplostim injection What is this medicine? ROMIPLOSTIM (roe mi PLOE stim) helps your body make more platelets. This medicine is used to treat low platelets caused by chronic idiopathic thrombocytopenic purpura (ITP). This medicine may be used for other purposes; ask your health care provider or pharmacist if you have questions. COMMON BRAND NAME(S): Nplate What should I tell my health care provider before I take this medicine? They need to know if you have any of these conditions: -cancer or myelodysplastic syndrome -low blood counts, like low white cell, platelet, or red cell counts -take medicines that treat or prevent blood clots -an unusual or allergic reaction to romiplostim, mannitol, other medicines, foods, dyes, or preservatives -pregnant or trying to get pregnant -breast-feeding How should I use this medicine? This medicine is for injection under the skin. It is given by a health care professional in a hospital or clinic setting. A special MedGuide will be given to you before your injection. Read this information carefully each time. Talk to your pediatrician regarding the use of this medicine in children. Special care may be needed. Overdosage: If you think you have taken too much of this medicine contact a poison control center or emergency room at once. NOTE: This medicine is only for you. Do not share this medicine with others. What if I miss a dose? It is important not to miss your dose. Call your doctor or health care professional if you are unable to keep an appointment. What may interact with this medicine? Interactions are not expected. This list may not describe all possible interactions. Give your health care provider a list of all the medicines, herbs, non-prescription drugs, or dietary supplements you use. Also tell them if you smoke, drink alcohol, or use illegal drugs. Some items may interact with your medicine. What should I watch for while using this  medicine? Your condition will be monitored carefully while you are receiving this medicine. Visit your prescriber or health care professional for regular checks on your progress and for the needed blood tests. It is important to keep all appointments. What side effects may I notice from receiving this medicine? Side effects that you should report to your doctor or health care professional as soon as possible: -allergic reactions like skin rash, itching or hives, swelling of the face, lips, or tongue -shortness of breath, chest pain, swelling in a leg -unusual bleeding or bruising Side effects that usually do not require medical attention (report to your doctor or health care professional if they continue or are bothersome): -dizziness -headache -muscle aches -pain in arms and legs -stomach pain -trouble sleeping This list may not describe all possible side effects. Call your doctor for medical advice about side effects. You may report side effects to FDA at 1-800-FDA-1088. Where should I keep my medicine? This drug is given in a hospital or clinic and will not be stored at home. NOTE: This sheet is a summary. It may not cover all possible information. If you have questions about this medicine, talk to your doctor, pharmacist, or health care provider.  2015, Elsevier/Gold Standard. (2008-03-07 15:13:04)  

## 2015-02-23 NOTE — Progress Notes (Signed)
Hematology and Oncology Follow Up Visit  Lisa James [id number] 06-Oct-1927 81 y.o. 02/23/2015   Principle Diagnosis:   Thrombocytopenia-likely immune thrombocytopenia  Current Therapy:   Nplate as indicated- maintain platelet count > than 100,000     Interim History:  Ms.  James is back for followup. She looks well. She feels well. She is pretty active at the nursing home.  She's had no bleeding. She's had no headache. There's been no nausea or vomiting. She's had no cough. She had no change in bowel or bladder habits.  There's been no leg swelling. She's had no rashes. She's had no bruising.  Lisa James appetite has been pretty good. She said that she eats quite a bit without difficulty.   Overall, Lisa James performance status is ECOG 3. Medications:  Current outpatient prescriptions:  .  atorvastatin (LIPITOR) 20 MG tablet, TAKE 1 TABLET BY MOUTH EVERY DAY., Disp: 30 tablet, Rfl: 1 .  Calcium Carbonate-Vitamin D (CALCIUM-VITAMIN D) 500-200 MG-UNIT per tablet, Take 1 tablet by mouth daily. , Disp: , Rfl:  .  carvedilol (COREG) 6.25 MG tablet, TAKE 1 TABLET TWICE A DAY WITH A MEAL., Disp: 60 tablet, Rfl: 1 .  Cholecalciferol (VITAMIN D) 1000 UNITS capsule, Take 1,000 Units by mouth at bedtime. , Disp: , Rfl:  .  furosemide (LASIX) 20 MG tablet, Take 1 tablet (20 mg total) by mouth daily., Disp: 30 tablet, Rfl: 3 .  irbesartan (AVAPRO) 300 MG tablet, TAKE 1 TABLET BY MOUTH EVERY DAY, Disp: 30 tablet, Rfl: 10 .  KLOR-CON M20 20 MEQ tablet, TAKE 1 TABLET EVERY DAY *NEEDS OFFICE VISIT FOR MORE REFILLS*, Disp: 30 tablet, Rfl: 0 .  KLOR-CON M20 20 MEQ tablet, TAKE 1 TABLET BY MOUTH ONCE DAILY, Disp: 30 tablet, Rfl: 0 .  Multiple Vitamins-Minerals (PRESERVISION AREDS PO), Take 1 tablet by mouth daily., Disp: , Rfl:  .  romiPLOStim (NPLATE) 250 MCG injection, Inject 1 mcg/kg into the skin as needed (every 2-3 weeks for low platelets). Given @ Oncology, Disp: , Rfl:  .  sertraline (ZOLOFT) 25 MG  tablet, TAKE 1 TABLET BY MOUTH EVERY DAY.  "OV NEEDED FOR ADDITIONAL REFILLS", Disp: 30 tablet, Rfl: 0 .  spironolactone (ALDACTONE) 25 MG tablet, Take 25 mg by mouth daily., Disp: , Rfl:   Allergies:  Allergies  Allergen Reactions  . Ace Inhibitors Cough    Past Medical History, Surgical history, Social history, and Family History were reviewed and updated.  Review of Systems: As above  Physical Exam:  height is 5\' 2"  (1.575 m) and weight is 116 lb (52.617 kg). Lisa James temperature is 97.9 F (36.6 C). Lisa James blood pressure is 150/51 and Lisa James pulse is 62. Lisa James respiration is 16.   Totally, petite white female in no obvious distress. Head and neck exam shows no ocular or oral lesions. Pupils reacted properly. Extraocular muscles are intact. There is no adenopathy in the neck. Lungs are clear. Cardiac exam is regular and rhythm . She has a 1/6 systolic ejection murmur. Abdomen is soft. She has good bowel sounds. There is no fluid wave. There is no palpable liver or spleen tip. Back exam shows no tenderness over the spine. She has some slight kyphosis. Extremities shows no clubbing cyanosis or edema. She has age related osteoarthritic changes. She has good strength bilaterally. Neurological exam is without any deficits. Skin exam shows no rashes, ecchymoses or petechia.  Lab Results  Component Value Date   WBC 6.9 02/23/2015   HGB 12.8 02/23/2015  HCT 36.4 02/23/2015   MCV 99 02/23/2015   PLT 92 Platelet count consistent in citrate* 02/23/2015     Chemistry      Component Value Date/Time   NA 136 09/08/2014 1038   NA 142 01/21/2013 1157   NA 142 11/19/2012 1750   K 4.7 09/08/2014 1038   K 5.3* 01/21/2013 1157   K 4.3 11/19/2012 1750   CL 101 09/08/2014 1038   CL 103 01/21/2013 1157   CL 109* 11/19/2012 1750   CO2 29 09/08/2014 1038   CO2 27 01/21/2013 1157   CO2 24 11/19/2012 1750   BUN 22 09/08/2014 1038   BUN 32* 01/21/2013 1157   BUN 32* 11/19/2012 1750   CREATININE 1.34*  09/08/2014 1038   CREATININE 1.55* 06/10/2014 1536   CREATININE 1.76* 11/19/2012 1750      Component Value Date/Time   CALCIUM 9.4 09/08/2014 1038   CALCIUM 9.8 01/21/2013 1157   CALCIUM 10.6* 11/19/2012 1750   ALKPHOS 85 09/08/2014 1038   ALKPHOS 67 01/21/2013 1157   ALKPHOS 69 11/19/2012 1750   AST 22 09/08/2014 1038   AST 24 01/21/2013 1157   AST 99* 11/19/2012 1750   ALT 14 09/08/2014 1038   ALT 18 01/21/2013 1157   ALT 52 11/19/2012 1750   BILITOT 1.6* 09/08/2014 1038   BILITOT 1.10 01/21/2013 1157   BILITOT 1.1* 11/19/2012 1750         Impression and Plan: Lisa James is a 81 year old female with thrombocytopenia. This is likely immune thrombocytopenia. I actually did do a bone marrow on Lisa James a couple of years ago..  She responds well to Nplate. We will go ahead and give Lisa James Nplate today.  I want to get Lisa James back in 4 weeks. We will see what Lisa James platelet count is at that point.     Lisa Napoleon, MD 8/4/20162:14 PM

## 2015-03-01 ENCOUNTER — Telehealth: Payer: Self-pay

## 2015-03-01 NOTE — Telephone Encounter (Signed)
Pt's daughter called to ask if pt needs to be seen, because she saw a note on one of her Rxs. She reported that pt was just here in May for a CPE. I advised that if a med is not specifically discussed at the check up, I will normally send a message to the provider to ask if we can RF rather than denying it if pt has been in recently for a check up. Daughter does not know of any medication that has been denied at this time, but just asked that she be called if pt needs to be seen.

## 2015-03-02 ENCOUNTER — Telehealth: Payer: Self-pay | Admitting: Family

## 2015-03-02 NOTE — Telephone Encounter (Signed)
RETURNED CARE[facility]ERS CALL (ANITA INGRAM) PT CAN ONLY COME ON Tuesday AND Thursday IN THE AM.  RESCHED APPT

## 2015-03-08 ENCOUNTER — Other Ambulatory Visit: Payer: Self-pay | Admitting: Physician Assistant

## 2015-03-13 ENCOUNTER — Other Ambulatory Visit: Payer: Self-pay

## 2015-03-13 ENCOUNTER — Other Ambulatory Visit: Payer: Self-pay | Admitting: *Deleted

## 2015-03-13 MED ORDER — FUROSEMIDE 20 MG PO TABS: 20.0000 mg | ORAL_TABLET | Freq: Every day | ORAL | Status: DC

## 2015-03-13 MED ORDER — IRBESARTAN 300 MG PO TABS: 300.0000 mg | ORAL_TABLET | Freq: Every day | ORAL | Status: DC

## 2015-03-29 ENCOUNTER — Ambulatory Visit: Payer: Medicare Other | Admitting: Family

## 2015-03-29 ENCOUNTER — Ambulatory Visit: Payer: Medicare Other

## 2015-03-29 ENCOUNTER — Other Ambulatory Visit: Payer: Medicare Other

## 2015-03-30 ENCOUNTER — Other Ambulatory Visit (HOSPITAL_BASED_OUTPATIENT_CLINIC_OR_DEPARTMENT_OTHER): Payer: Medicare Other

## 2015-03-30 ENCOUNTER — Ambulatory Visit (HOSPITAL_BASED_OUTPATIENT_CLINIC_OR_DEPARTMENT_OTHER): Payer: Medicare Other | Admitting: Family

## 2015-03-30 ENCOUNTER — Encounter: Payer: Self-pay | Admitting: Family

## 2015-03-30 ENCOUNTER — Ambulatory Visit (HOSPITAL_BASED_OUTPATIENT_CLINIC_OR_DEPARTMENT_OTHER): Payer: Medicare Other

## 2015-03-30 ENCOUNTER — Other Ambulatory Visit: Payer: Medicare Other

## 2015-03-30 ENCOUNTER — Ambulatory Visit: Payer: Medicare Other

## 2015-03-30 ENCOUNTER — Ambulatory Visit: Payer: Medicare Other | Admitting: Family

## 2015-03-30 VITALS — BP 143/53 | HR 53 | Temp 98.2°F | Resp 16 | Ht 62.0 in | Wt 117.0 lb

## 2015-03-30 DIAGNOSIS — D693 Immune thrombocytopenic purpura: Secondary | ICD-10-CM

## 2015-03-30 LAB — CBC WITH DIFFERENTIAL (CANCER CENTER ONLY)
BASO#: 0 10*3/uL (ref 0.0–0.2)
BASO%: 0.3 % (ref 0.0–2.0)
EOS ABS: 0.6 10*3/uL — AB (ref 0.0–0.5)
EOS%: 8.5 % — ABNORMAL HIGH (ref 0.0–7.0)
HEMATOCRIT: 35.4 % (ref 34.8–46.6)
HGB: 12.1 g/dL (ref 11.6–15.9)
LYMPH#: 1.1 10*3/uL (ref 0.9–3.3)
LYMPH%: 17.4 % (ref 14.0–48.0)
MCH: 34.2 pg — AB (ref 26.0–34.0)
MCHC: 34.2 g/dL (ref 32.0–36.0)
MCV: 100 fL (ref 81–101)
MONO#: 0.5 10*3/uL (ref 0.1–0.9)
MONO%: 7.9 % (ref 0.0–13.0)
NEUT#: 4.3 10*3/uL (ref 1.5–6.5)
NEUT%: 65.9 % (ref 39.6–80.0)
Platelets: 54 10*3/uL — ABNORMAL LOW (ref 145–400)
RBC: 3.54 10*6/uL — ABNORMAL LOW (ref 3.70–5.32)
RDW: 12.9 % (ref 11.1–15.7)
WBC: 6.6 10*3/uL (ref 3.9–10.0)

## 2015-03-30 LAB — CHCC SATELLITE - SMEAR

## 2015-03-30 MED ORDER — ROMIPLOSTIM 250 MCG ~~LOC~~ SOLR
3.0000 ug/kg | Freq: Once | SUBCUTANEOUS | Status: AC
  Administered 2015-03-30: 160 ug via SUBCUTANEOUS
  Filled 2015-03-30: qty 0.32

## 2015-03-30 NOTE — Patient Instructions (Signed)
Romiplostim injection What is this medicine? ROMIPLOSTIM (roe mi PLOE stim) helps your body make more platelets. This medicine is used to treat low platelets caused by chronic idiopathic thrombocytopenic purpura (ITP). This medicine may be used for other purposes; ask your health care provider or pharmacist if you have questions. COMMON BRAND NAME(S): Nplate What should I tell my health care provider before I take this medicine? They need to know if you have any of these conditions: -cancer or myelodysplastic syndrome -low blood counts, like low white cell, platelet, or red cell counts -take medicines that treat or prevent blood clots -an unusual or allergic reaction to romiplostim, mannitol, other medicines, foods, dyes, or preservatives -pregnant or trying to get pregnant -breast-feeding How should I use this medicine? This medicine is for injection under the skin. It is given by a health care professional in a hospital or clinic setting. A special MedGuide will be given to you before your injection. Read this information carefully each time. Talk to your pediatrician regarding the use of this medicine in children. Special care may be needed. Overdosage: If you think you have taken too much of this medicine contact a poison control center or emergency room at once. NOTE: This medicine is only for you. Do not share this medicine with others. What if I miss a dose? It is important not to miss your dose. Call your doctor or health care professional if you are unable to keep an appointment. What may interact with this medicine? Interactions are not expected. This list may not describe all possible interactions. Give your health care provider a list of all the medicines, herbs, non-prescription drugs, or dietary supplements you use. Also tell them if you smoke, drink alcohol, or use illegal drugs. Some items may interact with your medicine. What should I watch for while using this  medicine? Your condition will be monitored carefully while you are receiving this medicine. Visit your prescriber or health care professional for regular checks on your progress and for the needed blood tests. It is important to keep all appointments. What side effects may I notice from receiving this medicine? Side effects that you should report to your doctor or health care professional as soon as possible: -allergic reactions like skin rash, itching or hives, swelling of the face, lips, or tongue -shortness of breath, chest pain, swelling in a leg -unusual bleeding or bruising Side effects that usually do not require medical attention (report to your doctor or health care professional if they continue or are bothersome): -dizziness -headache -muscle aches -pain in arms and legs -stomach pain -trouble sleeping This list may not describe all possible side effects. Call your doctor for medical advice about side effects. You may report side effects to FDA at 1-800-FDA-1088. Where should I keep my medicine? This drug is given in a hospital or clinic and will not be stored at home. NOTE: This sheet is a summary. It may not cover all possible information. If you have questions about this medicine, talk to your doctor, pharmacist, or health care provider.  2015, Elsevier/Gold Standard. (2008-03-07 15:13:04)  

## 2015-03-30 NOTE — Progress Notes (Signed)
Hematology and Oncology Follow Up Visit  Lisa James [id number] 1927-08-17 81 y.o. 03/30/2015   Principle Diagnosis:  Thrombocytopenia-likely immune thrombocytopenia  Current Therapy:   Nplate as indicated    Interim History: Lisa James is here today for a follow-up. She is still doing well and staying active. She walks daily several times around her assisted living facility. She states that "her mother lived to 46 yo and she plans to outlive her." Her platelet count today is 54. She has had no bleeding, bruising or petechiae.  She has no complaints. No issue with infections.   She denies fatigue, fever, chills, n/v, cough, rash, headache, dizziness, SOB, chest pain, palpitations, abdominal pain, changes in her bowel or bladder habits. She has not noticed any blood in her urine or stool.  No swelling, tenderness, numbness or tingling in her extremities. No c/o pain.  She continues to eat well and stay hydrated. Her weight is stable at 117 lbs.   Medications:    Medication List       This list is accurate as of: 03/30/15 11:04 AM.  Always use your most recent med list.               atorvastatin 20 MG tablet  Commonly known as:  LIPITOR  TAKE 1 TABLET BY MOUTH EVERY DAY.     calcium-vitamin D 500-200 MG-UNIT per tablet  Take 1 tablet by mouth daily.     carvedilol 6.25 MG tablet  Commonly known as:  COREG  TAKE 1 TABLET TWICE A DAY WITH A MEAL.     furosemide 20 MG tablet  Commonly known as:  LASIX  Take 1 tablet (20 mg total) by mouth daily.     irbesartan 300 MG tablet  Commonly known as:  AVAPRO  Take 1 tablet (300 mg total) by mouth daily.     KLOR-CON M20 20 MEQ tablet  Generic drug:  potassium chloride SA  TAKE 1 TABLET EVERY DAY *NEEDS OFFICE VISIT FOR MORE REFILLS*     KLOR-CON M20 20 MEQ tablet  Generic drug:  potassium chloride SA  TAKE 1 TABLET BY MOUTH ONCE DAILY     PRESERVISION AREDS PO  Take 1 tablet by mouth daily.     romiPLOStim 250  MCG injection  Commonly known as:  NPLATE  Inject 1 mcg/kg into the skin as needed (every 2-3 weeks for low platelets). Given @ Oncology     sertraline 25 MG tablet  Commonly known as:  ZOLOFT  TAKE 1 TABLET BY MOUTH EVERY DAY. "OV NEEDED FOR ADDITIONAL REFILLS"     spironolactone 25 MG tablet  Commonly known as:  ALDACTONE  Take 25 mg by mouth daily.     Vitamin D 1000 UNITS capsule  Take 1,000 Units by mouth at bedtime.        Allergies:  Allergies  Allergen Reactions  . Ace Inhibitors Cough    Past Medical History, Surgical history, Social history, and Family History were reviewed and updated.  Review of Systems: All other 10 point review of systems is negative.   Physical Exam:  height is 5\' 2"  (1.575 m) and weight is 117 lb (53.071 kg). Her oral temperature is 98.2 F (36.8 C). Her blood pressure is 143/53 and her pulse is 53. Her respiration is 16.   Wt Readings from Last 3 Encounters:  03/30/15 117 lb (53.071 kg)  02/23/15 116 lb (52.617 kg)  02/02/15 116 lb (52.617 kg)    Ocular: Sclerae  unicteric, pupils equal, round and reactive to light Ear-nose-throat: Oropharynx clear, dentition fair Lymphatic: No cervical or supraclavicular adenopathy Lungs no rales or rhonchi, good excursion bilaterally Heart regular rate and rhythm, no murmur appreciated Abd soft, nontender, positive bowel sounds MSK no focal spinal tenderness, no joint edema Neuro: non-focal, well-oriented, appropriate affect Breasts: Deferred  Lab Results  Component Value Date   WBC 6.6 03/30/2015   HGB 12.1 03/30/2015   HCT 35.4 03/30/2015   MCV 100 03/30/2015   PLT 54* 03/30/2015   No results found for: FERRITIN, IRON, TIBC, UIBC, IRONPCTSAT Lab Results  Component Value Date   RETICCTPCT 2.6 05/13/2011   RBC 3.54* 03/30/2015   No results found for: [facility] Lab Results  Component Value Date   IGGSERUM 720 09/29/2012   IGA 115 09/29/2012   IGMSERUM 73  09/29/2012   Lab Results  Component Value Date   TOTALPROTELP 6.9 09/29/2012   TOTALPROTELP 6.9 09/29/2012   ALBUMINELP 63.4 09/29/2012   A1GS 4.3 09/29/2012   A2GS 12.3* 09/29/2012   BETS 7.2 09/29/2012   BETA2SER 3.6 09/29/2012   GAMS 9.2* 09/29/2012   MSPIKE NOT DET 09/29/2012   SPEI  09/29/2012     Comment:     Nonspecific pattern associated with slightly decreased gamma globulins. Reviewed by Odis Hollingshead, MD, PhD, FCAP (Electronic Signature on File)     Chemistry      Component Value Date/Time   NA 136 09/08/2014 1038   NA 142 01/21/2013 1157   NA 142 11/19/2012 1750   K 4.7 09/08/2014 1038   K 5.3* 01/21/2013 1157   K 4.3 11/19/2012 1750   CL 101 09/08/2014 1038   CL 103 01/21/2013 1157   CL 109* 11/19/2012 1750   CO2 29 09/08/2014 1038   CO2 27 01/21/2013 1157   CO2 24 11/19/2012 1750   BUN 22 09/08/2014 1038   BUN 32* 01/21/2013 1157   BUN 32* 11/19/2012 1750   CREATININE 1.34* 09/08/2014 1038   CREATININE 1.55* 06/10/2014 1536   CREATININE 1.76* 11/19/2012 1750      Component Value Date/Time   CALCIUM 9.4 09/08/2014 1038   CALCIUM 9.8 01/21/2013 1157   CALCIUM 10.6* 11/19/2012 1750   ALKPHOS 85 09/08/2014 1038   ALKPHOS 67 01/21/2013 1157   ALKPHOS 69 11/19/2012 1750   AST 22 09/08/2014 1038   AST 24 01/21/2013 1157   AST 99* 11/19/2012 1750   ALT 14 09/08/2014 1038   ALT 18 01/21/2013 1157   ALT 52 11/19/2012 1750   BILITOT 1.6* 09/08/2014 1038   BILITOT 1.10 01/21/2013 1157   BILITOT 1.1* 11/19/2012 1750     Impression and Plan: Lisa James is a very pleasant 81 year old female with immune thrombocytopenia. She continues to be asymptomatic and is doing well.  Her platelet count is down today at 54. She has had no bleeding or bruising.  She will receive Nplate today. We will plan to see her again in 1 month for labs and follow-up. She knows to contact us with any questions or concerns. We can certainly see her sooner if need be.     Eliezer Bottom, NP 9/8/201611:04 AM

## 2015-04-04 ENCOUNTER — Other Ambulatory Visit: Payer: Self-pay | Admitting: Emergency Medicine

## 2015-04-10 ENCOUNTER — Telehealth: Payer: Self-pay | Admitting: Hematology & Oncology

## 2015-04-10 NOTE — Telephone Encounter (Signed)
Patient's niece Anne Ng called and cx 05/01/15 apt and resch for 05/02/15

## 2015-04-25 ENCOUNTER — Encounter: Payer: Self-pay | Admitting: Emergency Medicine

## 2015-05-01 ENCOUNTER — Other Ambulatory Visit: Payer: Medicare Other

## 2015-05-01 ENCOUNTER — Ambulatory Visit: Payer: Medicare Other

## 2015-05-01 ENCOUNTER — Ambulatory Visit: Payer: Medicare Other | Admitting: Family

## 2015-05-02 ENCOUNTER — Ambulatory Visit (HOSPITAL_BASED_OUTPATIENT_CLINIC_OR_DEPARTMENT_OTHER): Payer: Medicare Other | Admitting: Family

## 2015-05-02 ENCOUNTER — Ambulatory Visit (HOSPITAL_BASED_OUTPATIENT_CLINIC_OR_DEPARTMENT_OTHER): Payer: Medicare Other

## 2015-05-02 ENCOUNTER — Other Ambulatory Visit (HOSPITAL_BASED_OUTPATIENT_CLINIC_OR_DEPARTMENT_OTHER): Payer: Medicare Other

## 2015-05-02 ENCOUNTER — Encounter: Payer: Self-pay | Admitting: Family

## 2015-05-02 VITALS — BP 182/81 | HR 84 | Temp 97.4°F | Resp 16 | Ht 62.0 in | Wt 113.0 lb

## 2015-05-02 DIAGNOSIS — D693 Immune thrombocytopenic purpura: Secondary | ICD-10-CM

## 2015-05-02 LAB — CBC WITH DIFFERENTIAL (CANCER CENTER ONLY)
BASO#: 0 10*3/uL (ref 0.0–0.2)
BASO%: 0.5 % (ref 0.0–2.0)
EOS ABS: 1 10*3/uL — AB (ref 0.0–0.5)
EOS%: 13.5 % — ABNORMAL HIGH (ref 0.0–7.0)
HEMATOCRIT: 39.9 % (ref 34.8–46.6)
HEMOGLOBIN: 13.7 g/dL (ref 11.6–15.9)
LYMPH#: 1.3 10*3/uL (ref 0.9–3.3)
LYMPH%: 17.3 % (ref 14.0–48.0)
MCH: 33.9 pg (ref 26.0–34.0)
MCHC: 34.3 g/dL (ref 32.0–36.0)
MCV: 99 fL (ref 81–101)
MONO#: 0.6 10*3/uL (ref 0.1–0.9)
MONO%: 8 % (ref 0.0–13.0)
NEUT%: 60.7 % (ref 39.6–80.0)
NEUTROS ABS: 4.4 10*3/uL (ref 1.5–6.5)
Platelets: 53 10*3/uL — ABNORMAL LOW (ref 145–400)
RBC: 4.04 10*6/uL (ref 3.70–5.32)
RDW: 12.8 % (ref 11.1–15.7)
WBC: 7.3 10*3/uL (ref 3.9–10.0)

## 2015-05-02 LAB — CHCC SATELLITE - SMEAR

## 2015-05-02 MED ORDER — ROMIPLOSTIM 250 MCG ~~LOC~~ SOLR
2.0000 ug/kg | Freq: Once | SUBCUTANEOUS | Status: AC
  Administered 2015-05-02: 105 ug via SUBCUTANEOUS
  Filled 2015-05-02: qty 0.21

## 2015-05-02 NOTE — Patient Instructions (Signed)
Romiplostim injection What is this medicine? ROMIPLOSTIM (roe mi PLOE stim) helps your body make more platelets. This medicine is used to treat low platelets caused by chronic idiopathic thrombocytopenic purpura (ITP). This medicine may be used for other purposes; ask your health care provider or pharmacist if you have questions. What should I tell my health care provider before I take this medicine? They need to know if you have any of these conditions: -cancer or myelodysplastic syndrome -low blood counts, like low white cell, platelet, or red cell counts -take medicines that treat or prevent blood clots -an unusual or allergic reaction to romiplostim, mannitol, other medicines, foods, dyes, or preservatives -pregnant or trying to get pregnant -breast-feeding How should I use this medicine? This medicine is for injection under the skin. It is given by a health care professional in a hospital or clinic setting. A special MedGuide will be given to you before your injection. Read this information carefully each time. Talk to your pediatrician regarding the use of this medicine in children. Special care may be needed. Overdosage: If you think you have taken too much of this medicine contact a poison control center or emergency room at once. NOTE: This medicine is only for you. Do not share this medicine with others. What if I miss a dose? It is important not to miss your dose. Call your doctor or health care professional if you are unable to keep an appointment. What may interact with this medicine? Interactions are not expected. This list may not describe all possible interactions. Give your health care provider a list of all the medicines, herbs, non-prescription drugs, or dietary supplements you use. Also tell them if you smoke, drink alcohol, or use illegal drugs. Some items may interact with your medicine. What should I watch for while using this medicine? Your condition will be monitored  carefully while you are receiving this medicine. Visit your prescriber or health care professional for regular checks on your progress and for the needed blood tests. It is important to keep all appointments. What side effects may I notice from receiving this medicine? Side effects that you should report to your doctor or health care professional as soon as possible: -allergic reactions like skin rash, itching or hives, swelling of the face, lips, or tongue -shortness of breath, chest pain, swelling in a leg -unusual bleeding or bruising Side effects that usually do not require medical attention (report to your doctor or health care professional if they continue or are bothersome): -dizziness -headache -muscle aches -pain in arms and legs -stomach pain -trouble sleeping This list may not describe all possible side effects. Call your doctor for medical advice about side effects. You may report side effects to FDA at 1-800-FDA-1088. Where should I keep my medicine? This drug is given in a hospital or clinic and will not be stored at home. NOTE: This sheet is a summary. It may not cover all possible information. If you have questions about this medicine, talk to your doctor, pharmacist, or health care provider.    2016, Elsevier/Gold Standard. (2008-03-07 15:13:04)  

## 2015-05-02 NOTE — Progress Notes (Signed)
Hematology and Oncology Follow Up Visit  Lisa James [id number] 1928/01/31 81 y.o. 05/02/2015   Principle Diagnosis:  Thrombocytopenia-likely immune thrombocytopenia  Current Therapy:   Nplate as indicated    Interim History: Lisa James is here today for a follow-up. As always she is doing well and has no complaints. She continue to walk daily around her assisted living facility. She is quite independent.  Her platelet count today is 53. No anemia. She denies fatigue, fever, chills, n/v, cough, rash, headache, dizziness, SOB, chest pain, palpitations, abdominal pain, changes in her bowel or bladder habits.  There have been no episodes of bleeding and no bruising.  No swelling, tenderness, numbness or tingling in her extremities. No c/o pain.  She states that she is eating well and staying hydrated. Her weight is down 4 lbs since her last visit.   Medications:    Medication List       This list is accurate as of: 05/02/15 10:32 AM.  Always use your most recent med list.               atorvastatin 20 MG tablet  Commonly known as:  LIPITOR  Take 1 tablet (20 mg total) by mouth daily. PATIENT NEEDS OFFICE VISIT FOR ADDITIONAL REFILLS     calcium-vitamin D 500-200 MG-UNIT tablet  Take 1 tablet by mouth daily.     carvedilol 6.25 MG tablet  Commonly known as:  COREG  Take 1 tablet (6.25 mg total) by mouth 2 (two) times daily with a meal. PATIENT NEEDS OFFICE VISIT FOR ADDITIONAL REFILLS     furosemide 20 MG tablet  Commonly known as:  LASIX  Take 1 tablet (20 mg total) by mouth daily.     irbesartan 300 MG tablet  Commonly known as:  AVAPRO  Take 1 tablet (300 mg total) by mouth daily.     KLOR-CON M20 20 MEQ tablet  Generic drug:  potassium chloride SA  TAKE 1 TABLET EVERY DAY *NEEDS OFFICE VISIT FOR MORE REFILLS*     KLOR-CON M20 20 MEQ tablet  Generic drug:  potassium chloride SA  TAKE 1 TABLET BY MOUTH ONCE DAILY     PRESERVISION AREDS PO  Take 1 tablet  by mouth daily.     romiPLOStim 250 MCG injection  Commonly known as:  NPLATE  Inject 1 mcg/kg into the skin as needed (every 2-3 weeks for low platelets). Given @ Oncology     sertraline 25 MG tablet  Commonly known as:  ZOLOFT  TAKE 1 TABLET BY MOUTH EVERY DAY. "OFFICE VISIT NEEDED FOR ADDITIONAL REFILLS"     spironolactone 25 MG tablet  Commonly known as:  ALDACTONE  Take 25 mg by mouth daily.     Vitamin D 1000 UNITS capsule  Take 1,000 Units by mouth at bedtime.        Allergies:  Allergies  Allergen Reactions  . Ace Inhibitors Cough    Past Medical History, Surgical history, Social history, and Family History were reviewed and updated.  Review of Systems: All other 10 point review of systems is negative.   Physical Exam:  vitals were not taken for this visit.  Wt Readings from Last 3 Encounters:  03/30/15 117 lb (53.071 kg)  02/23/15 116 lb (52.617 kg)  02/02/15 116 lb (52.617 kg)    Ocular: Sclerae unicteric, pupils equal, round and reactive to light Ear-nose-throat: Oropharynx clear, dentition fair Lymphatic: No cervical or supraclavicular adenopathy Lungs no rales or rhonchi, good excursion bilaterally  Heart regular rate and rhythm, no murmur appreciated Abd soft, nontender, positive bowel sounds MSK no focal spinal tenderness, no joint edema Neuro: non-focal, well-oriented, appropriate affect Breasts: Deferred  Lab Results  Component Value Date   WBC 7.3 05/02/2015   HGB 13.7 05/02/2015   HCT 39.9 05/02/2015   MCV 99 05/02/2015   PLT 53* 05/02/2015   No results found for: FERRITIN, IRON, TIBC, UIBC, IRONPCTSAT Lab Results  Component Value Date   RETICCTPCT 2.6 05/13/2011   RBC 4.04 05/02/2015   No results found for: [facility] Lab Results  Component Value Date   IGGSERUM 720 09/29/2012   IGA 115 09/29/2012   IGMSERUM 73 09/29/2012   Lab Results  Component Value Date   TOTALPROTELP 6.9 09/29/2012    TOTALPROTELP 6.9 09/29/2012   ALBUMINELP 63.4 09/29/2012   A1GS 4.3 09/29/2012   A2GS 12.3* 09/29/2012   BETS 7.2 09/29/2012   BETA2SER 3.6 09/29/2012   GAMS 9.2* 09/29/2012   MSPIKE NOT DET 09/29/2012   SPEI  09/29/2012     Comment:     Nonspecific pattern associated with slightly decreased gamma globulins. Reviewed by Odis Hollingshead, MD, PhD, FCAP (Electronic Signature on File)     Chemistry      Component Value Date/Time   NA 136 09/08/2014 1038   NA 142 01/21/2013 1157   NA 142 11/19/2012 1750   K 4.7 09/08/2014 1038   K 5.3* 01/21/2013 1157   K 4.3 11/19/2012 1750   CL 101 09/08/2014 1038   CL 103 01/21/2013 1157   CL 109* 11/19/2012 1750   CO2 29 09/08/2014 1038   CO2 27 01/21/2013 1157   CO2 24 11/19/2012 1750   BUN 22 09/08/2014 1038   BUN 32* 01/21/2013 1157   BUN 32* 11/19/2012 1750   CREATININE 1.34* 09/08/2014 1038   CREATININE 1.55* 06/10/2014 1536   CREATININE 1.76* 11/19/2012 1750      Component Value Date/Time   CALCIUM 9.4 09/08/2014 1038   CALCIUM 9.8 01/21/2013 1157   CALCIUM 10.6* 11/19/2012 1750   ALKPHOS 85 09/08/2014 1038   ALKPHOS 67 01/21/2013 1157   ALKPHOS 69 11/19/2012 1750   AST 22 09/08/2014 1038   AST 24 01/21/2013 1157   AST 99* 11/19/2012 1750   ALT 14 09/08/2014 1038   ALT 18 01/21/2013 1157   ALT 52 11/19/2012 1750   BILITOT 1.6* 09/08/2014 1038   BILITOT 1.10 01/21/2013 1157   BILITOT 1.1* 11/19/2012 1750     Impression and Plan: Lisa James is a very pleasant 81 year old female with immune thrombocytopenia. So far this has not been an issue for her. She continues to be asymptomatic and is doing well.  Platelet count today is 53 so we will proceed with giving her Nplate today as planned.  We will see her again in 1 month for labs and follow-up. She knows to contact us with any questions or concerns. We can certainly see her sooner if need be.    Eliezer Bottom, NP 10/11/201610:32 AM

## 2015-05-03 ENCOUNTER — Other Ambulatory Visit: Payer: Self-pay | Admitting: Emergency Medicine

## 2015-05-04 NOTE — Telephone Encounter (Signed)
Spoke with POA she is going to call and schedule an appoint for November for follow up

## 2015-05-12 ENCOUNTER — Encounter (HOSPITAL_COMMUNITY): Payer: Self-pay | Admitting: Emergency Medicine

## 2015-05-12 ENCOUNTER — Inpatient Hospital Stay (HOSPITAL_COMMUNITY)
Admission: EM | Admit: 2015-05-12 | Discharge: 2015-05-15 | DRG: 077 | Disposition: A | Discharge status: Home / Self Care | Payer: Medicare Other | Attending: Internal Medicine | Admitting: Internal Medicine

## 2015-05-12 ENCOUNTER — Telehealth: Payer: Self-pay

## 2015-05-12 ENCOUNTER — Emergency Department (HOSPITAL_COMMUNITY): Payer: Medicare Other

## 2015-05-12 ENCOUNTER — Ambulatory Visit (INDEPENDENT_AMBULATORY_CARE_PROVIDER_SITE_OTHER): Payer: Medicare Other | Admitting: Family Medicine

## 2015-05-12 VITALS — BP 116/68 | HR 66 | Temp 99.0°F | Resp 18 | Ht 58.5 in | Wt 115.8 lb

## 2015-05-12 DIAGNOSIS — D696 Thrombocytopenia, unspecified: Secondary | ICD-10-CM | POA: Diagnosis present

## 2015-05-12 DIAGNOSIS — E1122 Type 2 diabetes mellitus with diabetic chronic kidney disease: Secondary | ICD-10-CM | POA: Diagnosis present

## 2015-05-12 DIAGNOSIS — R410 Disorientation, unspecified: Secondary | ICD-10-CM | POA: Diagnosis not present

## 2015-05-12 DIAGNOSIS — R4182 Altered mental status, unspecified: Secondary | ICD-10-CM | POA: Diagnosis present

## 2015-05-12 DIAGNOSIS — I6203 Nontraumatic chronic subdural hemorrhage: Secondary | ICD-10-CM | POA: Diagnosis present

## 2015-05-12 DIAGNOSIS — I252 Old myocardial infarction: Secondary | ICD-10-CM

## 2015-05-12 DIAGNOSIS — I130 Hypertensive heart and chronic kidney disease with heart failure and stage 1 through stage 4 chronic kidney disease, or unspecified chronic kidney disease: Secondary | ICD-10-CM | POA: Diagnosis present

## 2015-05-12 DIAGNOSIS — N390 Urinary tract infection, site not specified: Secondary | ICD-10-CM

## 2015-05-12 DIAGNOSIS — E785 Hyperlipidemia, unspecified: Secondary | ICD-10-CM | POA: Diagnosis present

## 2015-05-12 DIAGNOSIS — S065XAA Traumatic subdural hemorrhage with loss of consciousness status unknown, initial encounter: Secondary | ICD-10-CM | POA: Diagnosis present

## 2015-05-12 DIAGNOSIS — I5022 Chronic systolic (congestive) heart failure: Secondary | ICD-10-CM | POA: Diagnosis present

## 2015-05-12 DIAGNOSIS — D693 Immune thrombocytopenic purpura: Secondary | ICD-10-CM | POA: Diagnosis present

## 2015-05-12 DIAGNOSIS — I2510 Atherosclerotic heart disease of native coronary artery without angina pectoris: Secondary | ICD-10-CM | POA: Diagnosis present

## 2015-05-12 DIAGNOSIS — R82998 Other abnormal findings in urine: Secondary | ICD-10-CM

## 2015-05-12 DIAGNOSIS — I080 Rheumatic disorders of both mitral and aortic valves: Secondary | ICD-10-CM | POA: Diagnosis present

## 2015-05-12 DIAGNOSIS — I674 Hypertensive encephalopathy: Principal | ICD-10-CM | POA: Diagnosis present

## 2015-05-12 DIAGNOSIS — E119 Type 2 diabetes mellitus without complications: Secondary | ICD-10-CM

## 2015-05-12 DIAGNOSIS — E782 Mixed hyperlipidemia: Secondary | ICD-10-CM | POA: Diagnosis present

## 2015-05-12 DIAGNOSIS — F0390 Unspecified dementia without behavioral disturbance: Secondary | ICD-10-CM | POA: Diagnosis present

## 2015-05-12 DIAGNOSIS — Z951 Presence of aortocoronary bypass graft: Secondary | ICD-10-CM

## 2015-05-12 DIAGNOSIS — I10 Essential (primary) hypertension: Secondary | ICD-10-CM | POA: Diagnosis present

## 2015-05-12 DIAGNOSIS — S065X9A Traumatic subdural hemorrhage with loss of consciousness of unspecified duration, initial encounter: Secondary | ICD-10-CM | POA: Diagnosis present

## 2015-05-12 LAB — COMPREHENSIVE METABOLIC PANEL
ALT: 12 U/L — AB (ref 14–54)
AST: 22 U/L (ref 15–41)
Albumin: 3.6 g/dL (ref 3.5–5.0)
Alkaline Phosphatase: 77 U/L (ref 38–126)
Anion gap: 9 (ref 5–15)
BUN: 13 mg/dL (ref 6–20)
CHLORIDE: 105 mmol/L (ref 101–111)
CO2: 23 mmol/L (ref 22–32)
CREATININE: 1.28 mg/dL — AB (ref 0.44–1.00)
Calcium: 9.1 mg/dL (ref 8.9–10.3)
GFR, EST AFRICAN AMERICAN: 43 mL/min — AB (ref >60)
GFR, EST NON AFRICAN AMERICAN: 37 mL/min — AB (ref >60)
Glucose, Bld: 172 mg/dL — ABNORMAL HIGH (ref 65–99)
Potassium: 3.9 mmol/L (ref 3.5–5.1)
Sodium: 137 mmol/L (ref 135–145)
Total Bilirubin: 1.2 mg/dL (ref 0.3–1.2)
Total Protein: 5.8 g/dL — ABNORMAL LOW (ref 6.5–8.1)

## 2015-05-12 LAB — POCT URINALYSIS DIP (MANUAL ENTRY)
Bilirubin, UA: NEGATIVE
Blood, UA: NEGATIVE
Glucose, UA: NEGATIVE
Ketones, POC UA: NEGATIVE
NITRITE UA: NEGATIVE
PH UA: 5.5
Protein Ur, POC: NEGATIVE
Spec Grav, UA: 1.01
Urobilinogen, UA: 0.2

## 2015-05-12 LAB — DIFFERENTIAL
BASOS PCT: 0 %
Basophils Absolute: 0 10*3/uL (ref 0.0–0.1)
EOS ABS: 0.7 10*3/uL (ref 0.0–0.7)
EOS PCT: 12 %
Lymphocytes Relative: 20 %
Lymphs Abs: 1.2 10*3/uL (ref 0.7–4.0)
MONO ABS: 0.4 10*3/uL (ref 0.1–1.0)
MONOS PCT: 7 %
NEUTROS ABS: 3.7 10*3/uL (ref 1.7–7.7)
Neutrophils Relative %: 61 %

## 2015-05-12 LAB — POCT CBC
Granulocyte percent: 75.8 %G (ref 37–80)
HEMATOCRIT: 40.3 % (ref 37.7–47.9)
Hemoglobin: 13.6 g/dL (ref 12.2–16.2)
LYMPH, POC: 1.2 (ref 0.6–3.4)
MCH, POC: 32.9 pg — AB (ref 27–31.2)
MCHC: 33.8 g/dL (ref 31.8–35.4)
MCV: 97.4 fL — AB (ref 80–97)
MID (cbc): 0.5 (ref 0–0.9)
MPV: 6.1 fL (ref 0–99.8)
PLATELET COUNT, POC: 182 10*3/uL (ref 142–424)
POC GRANULOCYTE: 5.5 (ref 2–6.9)
POC LYMPH %: 17 % (ref 10–50)
POC MID %: 7.2 %M (ref 0–12)
RBC: 4.14 M/uL (ref 4.04–5.48)
RDW, POC: 13.8 %
WBC: 7.2 10*3/uL (ref 4.6–10.2)

## 2015-05-12 LAB — POC MICROSCOPIC URINALYSIS (UMFC): MUCUS RE: ABSENT

## 2015-05-12 LAB — I-STAT CHEM 8, ED
BUN: 16 mg/dL (ref 6–20)
Calcium, Ion: 1.18 mmol/L (ref 1.13–1.30)
Chloride: 102 mmol/L (ref 101–111)
Creatinine, Ser: 1.2 mg/dL — ABNORMAL HIGH (ref 0.44–1.00)
Glucose, Bld: 168 mg/dL — ABNORMAL HIGH (ref 65–99)
HCT: 39 % (ref 36.0–46.0)
Hemoglobin: 13.3 g/dL (ref 12.0–15.0)
POTASSIUM: 3.8 mmol/L (ref 3.5–5.1)
Sodium: 138 mmol/L (ref 135–145)
TCO2: 21 mmol/L (ref 0–100)

## 2015-05-12 LAB — CBC
HEMATOCRIT: 37.2 % (ref 36.0–46.0)
Hemoglobin: 12.7 g/dL (ref 12.0–15.0)
MCH: 33.5 pg (ref 26.0–34.0)
MCHC: 34.1 g/dL (ref 30.0–36.0)
MCV: 98.2 fL (ref 78.0–100.0)
Platelets: 133 10*3/uL — ABNORMAL LOW (ref 150–400)
RBC: 3.79 MIL/uL — ABNORMAL LOW (ref 3.87–5.11)
RDW: 13.1 % (ref 11.5–15.5)
WBC: 6.1 10*3/uL (ref 4.0–10.5)

## 2015-05-12 LAB — APTT: aPTT: 32 seconds (ref 24–37)

## 2015-05-12 LAB — I-STAT TROPONIN, ED: TROPONIN I, POC: 0.02 ng/mL (ref 0.00–0.08)

## 2015-05-12 LAB — PROTIME-INR
INR: 1.19 (ref 0.00–1.49)
Prothrombin Time: 15.3 seconds — ABNORMAL HIGH (ref 11.6–15.2)

## 2015-05-12 LAB — CBG MONITORING, ED: Glucose-Capillary: 141 mg/dL — ABNORMAL HIGH (ref 65–99)

## 2015-05-12 LAB — GLUCOSE, POCT (MANUAL RESULT ENTRY): POC GLUCOSE: 97 mg/dL (ref 70–99)

## 2015-05-12 MED ORDER — HYDRALAZINE HCL 20 MG/ML IJ SOLN
10.0000 mg | Freq: Once | INTRAMUSCULAR | Status: AC
  Administered 2015-05-12: 10 mg via INTRAVENOUS
  Filled 2015-05-12: qty 1

## 2015-05-12 MED ORDER — LABETALOL HCL 5 MG/ML IV SOLN: 10.0000 mg | Freq: Once | INTRAVENOUS | Status: DC

## 2015-05-12 NOTE — ED Provider Notes (Signed)
CSN: [id number]     Arrival date & time 05/12/15  1906 History   First MD Initiated Contact with Patient 05/12/15 2027     Chief Complaint  Patient presents with  . Altered Mental Status     (Consider location/radiation/quality/duration/timing/severity/associated sxs/prior Treatment) Patient is a 81 y.o. female presenting with altered mental status. The history is provided by the patient and a caregiver. The history is limited by the condition of the patient.  Altered Mental Status Presenting symptoms: confusion   Severity:  Moderate Most recent episode:  Today Episode history:  Single Duration:  1 day Timing:  Constant Progression:  Waxing and waning Chronicity:  Recurrent Context: dementia and not taking medications as prescribed   Associated symptoms: no abdominal pain, normal movement, no agitation, no decreased appetite, no depression, no difficulty breathing, no eye deviation, no fever, no hallucinations, no headaches, no light-headedness, no nausea, no palpitations, no rash, no seizures, no slurred speech, no suicidal behavior, no visual change, no vomiting and no weakness     Past Medical History  Diagnosis Date  . Acute MI, lateral wall ([place])   . Chronic systolic heart failure ([place])   . Ischemic heart disease   . Mitral regurgitation   . HTN (hypertension)   . 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 Immune thrombocytopenic purpura ([place]) 01/21/2013  . Coronary artery disease     sees Dr Claiborne Billings every 6 months  . DM (diabetes mellitus) ([place])     type 2 ; diet controlled  . Thrombocytopenia ([place])     sees Dr Marin Olp every 2 weeks ;receives Nplate prn  . Subdural hematoma, post-traumatic ([place]) 2015    sees Dr Sherwood Gambler every 6 months  . Aortic stenosis   . Chronic kidney disease    Past Surgical History  Procedure Laterality Date  . Coronary artery bypass graft  11/20/2007    x5  . Bone marrow biopsy  11/22/2002    left posterior iliac crest bone marrow biopsy  and aspirate  . Submental lymph node excisional biopsy  10/22/2002  . Eye surgery    . Cataract extraction w/ intraocular lens  implant, bilateral Bilateral    Family History  Problem Relation Age of Onset  . Heart attack Father   . Diabetes Father    Social History  Substance Use Topics  . Smoking status: Never Smoker   . Smokeless tobacco: Never Used     Comment: never used tobacco.  . Alcohol Use: No   OB History    No data available     Review of Systems  Constitutional: Negative for fever and decreased appetite.  HENT: Negative for facial swelling.   Respiratory: Negative for shortness of breath.   Cardiovascular: Negative for chest pain and palpitations.  Gastrointestinal: Negative.  Negative for nausea, vomiting and abdominal pain.  Genitourinary: Negative for dysuria.  Musculoskeletal: Negative for back pain.  Skin: Negative for rash.  Neurological: Negative for seizures, weakness, light-headedness and headaches.  Psychiatric/Behavioral: Positive for confusion. Negative for hallucinations and agitation.      Allergies  Ace inhibitors  Home Medications   Prior to Admission medications   Medication Sig Start Date End Date Taking? Authorizing Provider  atorvastatin (LIPITOR) 20 MG tablet TAKE 1 TABLET BY MOUTH EVERY DAY 05/04/15   Chelle Jeffery, PA-C  Calcium Carbonate-Vitamin D (CALCIUM-VITAMIN D) 500-200 MG-UNIT per tablet Take 1 tablet by mouth daily.     Historical Provider, MD  carvedilol (  COREG) 6.25 MG tablet TAKE 1 TABLET BY MOUTH TWICE A DAY WITH A MEAL 05/04/15   Chelle Jeffery, PA-C  Cholecalciferol (VITAMIN D) 1000 UNITS capsule Take 1,000 Units by mouth at bedtime.     Historical Provider, MD  furosemide (LASIX) 20 MG tablet Take 1 tablet (20 mg total) by mouth daily. 03/13/15   Darlyne Russian, MD  irbesartan (AVAPRO) 300 MG tablet Take 1 tablet (300 mg total) by mouth daily. 03/13/15   Troy Sine, MD  KLOR-CON M20 20 MEQ tablet TAKE 1 TABLET EVERY  DAY *NEEDS OFFICE VISIT FOR MORE REFILLS* 11/15/14   Darlyne Russian, MD  KLOR-CON M20 20 MEQ tablet TAKE 1 TABLET BY MOUTH ONCE DAILY 02/09/15   Darlyne Russian, MD  Multiple Vitamins-Minerals (PRESERVISION AREDS PO) Take 1 tablet by mouth daily.    Historical Provider, MD  romiPLOStim (NPLATE) 250 MCG injection Inject 1 mcg/kg into the skin as needed (every 2-3 weeks for low platelets). Given @ Oncology    Historical Provider, MD  sertraline (ZOLOFT) 25 MG tablet TAKE 1 TABLET BY MOUTH EVERY DAY 05/04/15   Darlyne Russian, MD  spironolactone (ALDACTONE) 25 MG tablet Take 25 mg by mouth daily.    Historical Provider, MD   BP 182/78 mmHg  Pulse 61  Temp(Src) 99.4 F (37.4 C) (Oral)  Resp 14  Wt 115 lb (52.164 kg)  SpO2 96%  LMP  (LMP Unknown) Physical Exam  Constitutional: She appears well-developed and well-nourished. No distress.  HENT:  Head: Normocephalic and atraumatic.  Right Ear: External ear normal.  Left Ear: External ear normal.  Nose: Nose normal.  Mouth/Throat: Oropharynx is clear and moist. No oropharyngeal exudate.  Eyes: Conjunctivae and EOM are normal. Pupils are equal, round, and reactive to light. Right eye exhibits no discharge. Left eye exhibits no discharge. No scleral icterus.  Neck: Normal range of motion. Neck supple. No JVD present. No tracheal deviation present. No thyromegaly present.  Cardiovascular: Normal rate, regular rhythm and intact distal pulses.   Pulmonary/Chest: Effort normal and breath sounds normal. No stridor. No respiratory distress. She has no wheezes. She has no rales. She exhibits no tenderness.  Abdominal: Soft. She exhibits no distension. There is no tenderness.  Musculoskeletal: Normal range of motion. She exhibits no edema or tenderness.  Lymphadenopathy:    She has no cervical adenopathy.  Neurological: She is alert. She has normal strength. She displays no atrophy and no tremor. She exhibits normal muscle tone. She displays no seizure  activity. GCS eye subscore is 4. GCS verbal subscore is 4. GCS motor subscore is 6.  Skin: Skin is warm and dry. No rash noted. She is not diaphoretic. No erythema. No pallor.  Psychiatric: She has a normal mood and affect. Her behavior is normal. Judgment and thought content normal.  Nursing note and vitals reviewed.   ED Course  Procedures (including critical care time) Labs Review Labs Reviewed  PROTIME-INR - Abnormal; Notable for the following:    Prothrombin Time 15.3 (*)    All other components within normal limits  CBC - Abnormal; Notable for the following:    RBC 3.79 (*)    Platelets 133 (*)    All other components within normal limits  I-STAT CHEM 8, ED - Abnormal; Notable for the following:    Creatinine, Ser 1.20 (*)    Glucose, Bld 168 (*)    All other components within normal limits  CBG MONITORING, ED - Abnormal; Notable for  the following:    Glucose-Capillary 141 (*)    All other components within normal limits  APTT  DIFFERENTIAL  COMPREHENSIVE METABOLIC PANEL  I-STAT TROPOININ, ED    Imaging Review Ct Head Wo Contrast  05/12/2015  CLINICAL DATA:  Sudden onset of altered mental status. Stroke symptoms. EXAM: CT HEAD WITHOUT CONTRAST TECHNIQUE: Contiguous axial images were obtained from the base of the skull through the vertex without intravenous contrast. COMPARISON:  Multiple prior head CTs most recently 01/31/2015. Interval exams dating back to 2015 also reviewed. FINDINGS: Chronic large complex left subdural collection is unchanged in appearance compared to prior exam and consistent with calcified subdural hematoma. This is overall unchanged in size and appearance compared to prior exam. The maximal thickness is 2.9 cm. Midline shift of 5 mm is unchanged. Small anterior left frontal subdural collection is also unchanged in size and appearance. Isodense right subdural collection consistent with hygroma, also stable in appearance without acute component. Generalized  atrophy is stable from prior. No hydrocephalus. Chronic small vessel ischemic change, also stable. No CT findings of acute territorial infarct or ischemia. The calvarium is intact. Included paranasal sinuses are well aerated. There is minimal opacification of lower left mastoid air cells. Right mastoid air cells are well aerated. IMPRESSION: 1. Stable chronic large calcifying subdural hematoma, maximal thickness 2.9 cm. Left-to-right midline shift of 5 mm is unchanged. 2. Stable small right subdural hygroma. 3. No acute intracranial abnormality is seen. Electronically Signed   By: Jeb Levering M.D.   On: 05/12/2015 20:23   I have personally reviewed and evaluated these images and lab results as part of my medical decision-making.   EKG Interpretation   Date/Time:  Friday May 12 2015 20:04:32 EDT Ventricular Rate:  61 PR Interval:  222 QRS Duration: 122 QT Interval:  436 QTC Calculation: 438 R Axis:   -48 Text Interpretation:  Sinus rhythm with 1st degree A-V block Left anterior  fascicular block Left ventricular hypertrophy with QRS widening and  repolarization abnormality Abnormal ECG No significant change since last  tracing Confirmed by Kathrynn Humble, MD, Thel[facility] Comp [phone]) on 05/12/2015 9:30:42 PM      MDM   Final diagnoses:  Altered mental status, unspecified altered mental status type  Hypertensive encephalopathy    Patient was seen by PCP today. Appeared to have acute delirium superimposed on dementia. Per patient's niece she has had some decline over the past few weeks, and has been more forgetful about her medications. She seen more acutely confused today. History of prior chronic subdural hemorrhage. She had delirium associated with her initial presentation for SDH. Urinalysis at PCP was negative. No other overt findings to suggest an etiology. Initial workup obtained upon arrival to the emergency department shows stable SDH. No other acute electrolyte abnormalities which explain  her condition. Of note, patient has elevated blood pressure. Possibly due to failure to take her medications, however this could also be the result of cerebral ischemia. MRI obtained which shows no acute findings. Upon review of medications patient is on Zoloft. Possibly having withdrawal from failure to take SSRI. Also possibly having hypertensive encephalopathy. Following confirmation of no acute stroke on MRI findings, patient given BP medication. Results were discussed with patient and family patient to be admitted for further workup and evaluation. Patient was discussed with hospitalist.   Patient care was discussed with my attending, Dr. Kathrynn Humble.    Hoyle Sauer, MD 05/13/15 [id number]  Hoyle Sauer, MD 05/13/15 3005  Varney Biles, MD 06/02/15 1102

## 2015-05-12 NOTE — Progress Notes (Addendum)
Subjective:    Patient ID: Lisa James, female    DOB: 04/19/1928, 81 y.o.   MRN: [id number] This chart was scribed for Merri Ray, MD by Zola Button, Medical Scribe. This patient was seen in Room 12 and the patient's care was started at 5:52 PM.    HPI HPI Comments: Lisa James is a 81 y.o. female with her niece Anne Ng who presents to the [facility] complaining of altered mental status for 1 week. Patient has multiple medical problems, including CAD, PAD, chronic kidney disease, subdural hematoma, non-Hodgkin's lymphoma, and ITP. She is followed by hematology, Dr. Martha Clan. She last received an Nplate injection on October 11th for a platelet level of 53. Her last A1c was 6.9 five months ago.   Patient has been noted to be confused with disordered thinking (worse than baseline dementia) for the past week. When her niece came to visit 1 week ago, she had been saying things that did not make sense. Her niece also notes that there were medications she forgot to take. She denies headaches, unsteadiness, hallucinations, difficulty urinating, bladder incontinence (but occasionally wears Depends), and malodorous urine. She has not been treated for UTI recently. Her niece has concern she may be having some bleeding in her brain; she did have a brain bleed a few years ago and did have some hallucinations and confusion at that time.  PCP: Dr. Everlene Farrier  Patient Active Problem List   Diagnosis Date Noted  . ITP (idiopathic thrombocytopenic purpura) 07/06/2013  . Aortic valve stenosis 06/19/2013  . Immune thrombocytopenic purpura ([place]) 01/21/2013  . GERD (gastroesophageal reflux disease) 12/17/2012  . Type II or unspecified type diabetes mellitus with renal manifestations, not stated as uncontrolled 12/10/2012  . Chronic kidney disease (CKD), stage III (moderate) 11/22/2012  . Diabetes mellitus ([place]) 07/03/2011  . Depression 07/03/2011  . Non-Hodgkin's lymphoma ([place])  07/03/2011  . CAD (coronary artery disease) 07/03/2011  . PAD (peripheral artery disease) ([place]) 07/03/2011  . Vitamin D deficiency 07/03/2011  . Subdural hematoma ([place]) 07/03/2011  . Confusion 05/12/2011  . Subdural hematoma, acute ([place]) 05/12/2011  . HYPERLIPIDEMIA TYPE IIB / III 05/10/2009  . OLD MYOCARDIAL INFARCTION 05/10/2009  . Occlusion and stenosis of carotid artery without mention of cerebral infarction 05/10/2009  . HYPERTENSION, BENIGN 11/03/2008  . MITRAL REGURGITATION 10/29/2008  . ISCHEMIC HEART DISEASE 10/29/2008  . SYSTOLIC HEART FAILURE, CHRONIC 10/29/2008   Past Medical History  Diagnosis Date  . Acute MI, lateral wall ([place])   . Chronic systolic heart failure ([place])   . Ischemic heart disease   . Mitral regurgitation   . HTN (hypertension)   . Non Hodgkin's lymphoma ([place])     of the throat  . Immune thrombocytopenic purpura ([place]) 01/21/2013  . Coronary artery disease     sees Dr Claiborne Billings every 6 months  . DM (diabetes mellitus) ([place])     type 2 ; diet controlled  . Thrombocytopenia ([place])     sees Dr Marin Olp every 2 weeks ;receives Nplate prn  . Subdural hematoma, post-traumatic ([place]) 2015    sees Dr Sherwood Gambler every 6 months  . Aortic stenosis   . Chronic kidney disease    Past Surgical History  Procedure Laterality Date  . Coronary artery bypass graft  11/20/2007    x5  . Bone marrow biopsy  11/22/2002    left posterior iliac crest bone marrow biopsy and aspirate  . Submental lymph node excisional biopsy  10/22/2002  .  Eye surgery    . Cataract extraction w/ intraocular lens  implant, bilateral Bilateral    Allergies  Allergen Reactions  . Ace Inhibitors Cough   Prior to Admission medications   Medication Sig Start Date End Date Taking? Authorizing Provider  atorvastatin (LIPITOR) 20 MG tablet TAKE 1 TABLET BY MOUTH EVERY DAY 05/04/15  Yes Chelle Jeffery, PA-C  Calcium Carbonate-Vitamin D (CALCIUM-VITAMIN D) 500-200 MG-UNIT per tablet Take 1 tablet by mouth  daily.    Yes Historical Provider, MD  carvedilol (COREG) 6.25 MG tablet TAKE 1 TABLET BY MOUTH TWICE A DAY WITH A MEAL 05/04/15  Yes Chelle Jeffery, PA-C  Cholecalciferol (VITAMIN D) 1000 UNITS capsule Take 1,000 Units by mouth at bedtime.    Yes Historical Provider, MD  furosemide (LASIX) 20 MG tablet Take 1 tablet (20 mg total) by mouth daily. 03/13/15  Yes Darlyne Russian, MD  irbesartan (AVAPRO) 300 MG tablet Take 1 tablet (300 mg total) by mouth daily. 03/13/15  Yes Troy Sine, MD  KLOR-CON M20 20 MEQ tablet TAKE 1 TABLET EVERY DAY *NEEDS OFFICE VISIT FOR MORE REFILLS* 11/15/14  Yes Darlyne Russian, MD  KLOR-CON M20 20 MEQ tablet TAKE 1 TABLET BY MOUTH ONCE DAILY 02/09/15  Yes Darlyne Russian, MD  Multiple Vitamins-Minerals (PRESERVISION AREDS PO) Take 1 tablet by mouth daily.   Yes Historical Provider, MD  romiPLOStim (NPLATE) 250 MCG injection Inject 1 mcg/kg into the skin as needed (every 2-3 weeks for low platelets). Given @ Oncology   Yes Historical Provider, MD  sertraline (ZOLOFT) 25 MG tablet TAKE 1 TABLET BY MOUTH EVERY DAY 05/04/15  Yes Darlyne Russian, MD  spironolactone (ALDACTONE) 25 MG tablet Take 25 mg by mouth daily.   Yes Historical Provider, MD   Social History   Social History  . Marital Status: Widowed    Spouse Name: N/A  . Number of Children: N/A  . Years of Education: N/A   Occupational History  . Not on file.   Social History Main Topics  . Smoking status: Never Smoker   . Smokeless tobacco: Never Used     Comment: never used tobacco.  . Alcohol Use: No  . Drug Use: No  . Sexual Activity: No   Other Topics Concern  . Not on file   Social History Narrative     Review of Systems  Genitourinary: Negative for enuresis and difficulty urinating.  Musculoskeletal: Negative for gait problem.  Neurological: Negative for headaches.  Psychiatric/Behavioral: Positive for confusion. Negative for hallucinations.       Objective:   Physical Exam  Constitutional:  She appears well-developed and well-nourished. No distress.  HENT:  Head: Normocephalic and atraumatic.  Mouth/Throat: Oropharynx is clear and moist. No oropharyngeal exudate.  Moist oral mucosa. Dentures intact.   Eyes: EOM are normal. Pupils are equal, round, and reactive to light. Right eye exhibits no nystagmus. Left eye exhibits no nystagmus.  Neck: Neck supple. Carotid bruit is not present.  Cardiovascular: Normal rate and regular rhythm.   Murmur heard.  Systolic murmur is present  8-6/5 systolic murmur, left upper sternal border. Regular rhythm, no ectopy.  Pulmonary/Chest: Effort normal and breath sounds normal. No respiratory distress. She has no wheezes. She has no rales.  Clear to auscultation bilaterally.   Abdominal: Soft. She exhibits no distension. There is no tenderness.  Musculoskeletal: She exhibits no edema.  Neurological: She is alert. No cranial nerve deficit. She displays a negative Romberg sign.  Equal facial  movements, no droop. Equal facial strength. Normal tongue movement. Equal grip strength. Equal lower extremity strength. No pronator drift. Normal finger-nose-finger. Oriented to person and place, not time, date, or location within building.  Skin: Skin is warm and dry. No rash noted.  Psychiatric: She has a normal mood and affect. Her behavior is normal.  Nursing note and vitals reviewed.     Filed Vitals:   05/12/15 1737  BP: 116/68  Pulse: 66  Temp: 99 F (37.2 C)  TempSrc: Oral  Resp: 18  Height: 4' 10.5" (1.486 m)  Weight: 115 lb 12.8 oz (52.527 kg)  SpO2: 97%    Results for orders placed or performed in visit on 05/12/15  POCT CBC  Result Value Ref Range   WBC 7.2 4.6 - 10.2 K/uL   Lymph, poc 1.2 0.6 - 3.4   POC LYMPH PERCENT 17.0 10 - 50 %L   MID (cbc) 0.5 0 - 0.9   POC MID % 7.2 0 - 12 %M   POC Granulocyte 5.5 2 - 6.9   Granulocyte percent 75.8 37 - 80 %G   RBC 4.14 4.04 - 5.48 M/uL   Hemoglobin 13.6 12.2 - 16.2 g/dL   HCT, POC 40.3  37.7 - 47.9 %   MCV 97.4 (A) 80 - 97 fL   MCH, POC 32.9 (A) 27 - 31.2 pg   MCHC 33.8 31.8 - 35.4 g/dL   RDW, POC 13.8 %   Platelet Count, POC 182 142 - 424 K/uL   MPV 6.1 0 - 99.8 fL  POCT glucose (manual entry)  Result Value Ref Range   POC Glucose 97 70 - 99 mg/dl  POCT urinalysis dipstick  Result Value Ref Range   Color, UA yellow yellow   Clarity, UA clear clear   Glucose, UA negative negative   Bilirubin, UA negative negative   Ketones, POC UA negative negative   Spec Grav, UA 1.010    Blood, UA negative negative   pH, UA 5.5    Protein Ur, POC negative negative   Urobilinogen, UA 0.2    Nitrite, UA Negative Negative   Leukocytes, UA Trace (A) Negative  POCT Microscopic Urinalysis ([facility])  Result Value Ref Range   WBC,UR,HPF,POC Few (A) None WBC/hpf   RBC,UR,HPF,POC None None RBC/hpf   Bacteria None None, Too numerous to count   Mucus Absent Absent   Epithelial Cells, UR Per Microscopy Few (A) None, Too numerous to count cells/hpf        Assessment & Plan:  Lisa James is a 81 y.o. female Type 2 diabetes mellitus with chronic kidney disease, without long-term current use of insulin, unspecified CKD stage ([place]) - Plan: POCT glucose (manual entry), POCT urinalysis dipstick, POCT Microscopic Urinalysis ([facility]), Urine culture  Delirium - Plan: POCT CBC, POCT glucose (manual entry), POCT urinalysis dipstick, POCT Microscopic Urinalysis ([facility]), Urine culture  ITP (idiopathic thrombocytopenic purpura) - Plan: POCT CBC  Urine WBC increased - Plan: Urine culture  History of multiple medical problems as above including DM, ITP, CAD, PAD, CKD with 1 week history of increased confusion and disordered thinking. Hx of intracranial bleed in past, but no focal neurologic findings on exam. No definite UTI or source of delirium identified in office.   -eval at [facility] for further causes of mental status change - appears to be delirium on superimposed dementia.   -triage nurse  advised at [facility].   No orders of the defined types were placed in this encounter.  Patient Instructions  I do not see an apparent cause of your mental status changes here in the office. After leaving our office - go to [facility] emergency room for further evaluation. I did check a urine culture, but less likely a urine infection based on test in office.   Confusion Confusion is the inability to think with your usual speed or clarity. Confusion may come on quickly or slowly over time. How quickly the confusion comes on depends on the cause. Confusion can be due to any number of causes. CAUSES   Concussion, head injury, or head trauma.  Seizures.  Stroke.  Fever.  Brain tumor.  Age related decreased brain function (dementia).  Heightened emotional states like rage or terror.  Mental illness in which the person loses the ability to determine what is real and what is not (hallucinations).  Infections such as a urinary tract infection (UTI).  Toxic effects from alcohol, drugs, or prescription medicines.  Dehydration and an imbalance of salts in the body (electrolytes).  Lack of sleep.  Low blood sugar (diabetes).  Low levels of oxygen from conditions such as chronic lung disorders.  Drug interactions or other medicine side effects.  Nutritional deficiencies, especially niacin, thiamine, vitamin C, or vitamin B.  Sudden drop in body temperature (hypothermia).  Change in routine, such as when traveling or hospitalized. SIGNS AND SYMPTOMS  People often describe their thinking as cloudy or unclear when they are confused. Confusion can also include feeling disoriented. That means you are unaware of where or who you are. You may also not know what the date or time is. If confused, you may also have difficulty paying attention, remembering, and making decisions. Some people also act aggressively when they are confused.  DIAGNOSIS  The medical evaluation of confusion may  include:  Blood and urine tests.  X-rays.  Brain and nervous system tests.  Analyzing your brain waves (electroencephalogram or EEG).  Magnetic resonance imaging (MRI) of your head.  Computed tomography (CT) scan of your head.  Mental status tests in which your health care provider may ask many questions. Some of these questions may seem silly or strange, but they are a very important test to help diagnose and treat confusion. TREATMENT  An admission to the hospital may not be needed, but a person with confusion should not be left alone. Stay with a family member or friend until the confusion clears. Avoid alcohol, pain relievers, or sedative drugs until you have fully recovered. Do not drive until directed by your health care provider. HOME CARE INSTRUCTIONS  What family and friends can do:  To find out if someone is confused, ask the person to state his or her name, age, and the date. If the person is unsure or answers incorrectly, he or she is confused.  Always introduce yourself, no matter how well the person knows you.  Often remind the person of his or her location.  Place a calendar and clock near the confused person.  Help the person with his or her medicines. You may want to use a pill box, an alarm as a reminder, or give the person each dose as prescribed.  Talk about current events and plans for the day.  Try to keep the environment calm, quiet, and peaceful.  Make sure the person keeps follow-up visits with his or her health care provider. PREVENTION  Ways to prevent confusion:  Avoid alcohol.  Eat a balanced diet.  Get enough sleep.  Take medicine only  as directed by your health care provider.  Do not become isolated. Spend time with other people and make plans for your days.  Keep careful watch on your blood sugar levels if you are diabetic. SEEK IMMEDIATE MEDICAL CARE IF:   You develop severe headaches, repeated vomiting, seizures, blackouts, or  slurred speech.  There is increasing confusion, weakness, numbness, restlessness, or personality changes.  You develop a loss of balance, have marked dizziness, feel uncoordinated, or fall.  You have delusions, hallucinations, or develop severe anxiety.  Your family members think you need to be rechecked.   This information is not intended to replace advice given to you by your health care provider. Make sure you discuss any questions you have with your health care provider.   Document Released: 08/15/2004 Document Revised: 07/29/2014 Document Reviewed: 08/13/2013 Elsevier Interactive Patient Education [employer].          By signing my name below, I, Zola Button, attest that this documentation has been prepared under the direction and in the presence of Merri Ray, MD.  Electronically Signed: Zola Button, Medical Scribe. 05/12/2015. 5:52 PM.

## 2015-05-12 NOTE — ED Notes (Signed)
Pt niece brought pt to ER d/t increased confusion and disorientation over the past week. Pt unable to tell age, month or president. Oriented to self and place. Pt has no complaints at this time. Pt has hx of old head bleed and low platelet counts. Pt htn 182/78

## 2015-05-12 NOTE — Telephone Encounter (Signed)
Advised niece to bring her in. She agreed.

## 2015-05-12 NOTE — Patient Instructions (Signed)
I do not see an apparent cause of your mental status changes here in the office. After leaving our office - go to [facility] emergency room for further evaluation. I did check a urine culture, but less likely a urine infection based on test in office.   Confusion Confusion is the inability to think with your usual speed or clarity. Confusion may come on quickly or slowly over time. How quickly the confusion comes on depends on the cause. Confusion can be due to any number of causes. CAUSES   Concussion, head injury, or head trauma.  Seizures.  Stroke.  Fever.  Brain tumor.  Age related decreased brain function (dementia).  Heightened emotional states like rage or terror.  Mental illness in which the person loses the ability to determine what is real and what is not (hallucinations).  Infections such as a urinary tract infection (UTI).  Toxic effects from alcohol, drugs, or prescription medicines.  Dehydration and an imbalance of salts in the body (electrolytes).  Lack of sleep.  Low blood sugar (diabetes).  Low levels of oxygen from conditions such as chronic lung disorders.  Drug interactions or other medicine side effects.  Nutritional deficiencies, especially niacin, thiamine, vitamin C, or vitamin B.  Sudden drop in body temperature (hypothermia).  Change in routine, such as when traveling or hospitalized. SIGNS AND SYMPTOMS  People often describe their thinking as cloudy or unclear when they are confused. Confusion can also include feeling disoriented. That means you are unaware of where or who you are. You may also not know what the date or time is. If confused, you may also have difficulty paying attention, remembering, and making decisions. Some people also act aggressively when they are confused.  DIAGNOSIS  The medical evaluation of confusion may include:  Blood and urine tests.  X-rays.  Brain and nervous system tests.  Analyzing your brain waves  (electroencephalogram or EEG).  Magnetic resonance imaging (MRI) of your head.  Computed tomography (CT) scan of your head.  Mental status tests in which your health care provider may ask many questions. Some of these questions may seem silly or strange, but they are a very important test to help diagnose and treat confusion. TREATMENT  An admission to the hospital may not be needed, but a person with confusion should not be left alone. Stay with a family member or friend until the confusion clears. Avoid alcohol, pain relievers, or sedative drugs until you have fully recovered. Do not drive until directed by your health care provider. HOME CARE INSTRUCTIONS  What family and friends can do:  To find out if someone is confused, ask the person to state his or her name, age, and the date. If the person is unsure or answers incorrectly, he or she is confused.  Always introduce yourself, no matter how well the person knows you.  Often remind the person of his or her location.  Place a calendar and clock near the confused person.  Help the person with his or her medicines. You may want to use a pill box, an alarm as a reminder, or give the person each dose as prescribed.  Talk about current events and plans for the day.  Try to keep the environment calm, quiet, and peaceful.  Make sure the person keeps follow-up visits with his or her health care provider. PREVENTION  Ways to prevent confusion:  Avoid alcohol.  Eat a balanced diet.  Get enough sleep.  Take medicine only as directed by  your health care provider.  Do not become isolated. Spend time with other people and make plans for your days.  Keep careful watch on your blood sugar levels if you are diabetic. SEEK IMMEDIATE MEDICAL CARE IF:   You develop severe headaches, repeated vomiting, seizures, blackouts, or slurred speech.  There is increasing confusion, weakness, numbness, restlessness, or personality changes.  You  develop a loss of balance, have marked dizziness, feel uncoordinated, or fall.  You have delusions, hallucinations, or develop severe anxiety.  Your family members think you need to be rechecked.   This information is not intended to replace advice given to you by your health care provider. Make sure you discuss any questions you have with your health care provider.   Document Released: 08/15/2004 Document Revised: 07/29/2014 Document Reviewed: 08/13/2013 Elsevier Interactive Patient Education [employer].

## 2015-05-12 NOTE — Telephone Encounter (Signed)
Patient's niece called and states the patient is saying very odd things. She has had bleeding on her brain in the past and she has dementia. Her niece is very worried and wants to know what she should do.   Lisa James [phone]

## 2015-05-12 NOTE — ED Notes (Signed)
Pt had UA completed at he doctors office pta

## 2015-05-12 NOTE — ED Notes (Addendum)
Niece reports pt has had confusion that she noticed last Friday.  A neighbor called her and was concerned about pt saying no one was taking care of her.  Pt alert and oriented to person and place.  States it is March and unsure of year.  Niece states pt was just seen at PCP office to R/o UTI.  States PCP wanted her to come to ED for CT.  Reports history of "Brain bleeding".  Pt denies pain.  No neuro deficits.  Niece states that recently she has been finding pt's pills lying around.  Pt has a machine that dispenses medication and in the past always took her meds.

## 2015-05-13 ENCOUNTER — Encounter (HOSPITAL_COMMUNITY): Payer: Self-pay | Admitting: Internal Medicine

## 2015-05-13 DIAGNOSIS — R4182 Altered mental status, unspecified: Secondary | ICD-10-CM

## 2015-05-13 DIAGNOSIS — E1122 Type 2 diabetes mellitus with diabetic chronic kidney disease: Secondary | ICD-10-CM | POA: Diagnosis present

## 2015-05-13 DIAGNOSIS — I10 Essential (primary) hypertension: Secondary | ICD-10-CM | POA: Diagnosis not present

## 2015-05-13 DIAGNOSIS — I080 Rheumatic disorders of both mitral and aortic valves: Secondary | ICD-10-CM | POA: Diagnosis present

## 2015-05-13 DIAGNOSIS — Z951 Presence of aortocoronary bypass graft: Secondary | ICD-10-CM | POA: Diagnosis not present

## 2015-05-13 DIAGNOSIS — I674 Hypertensive encephalopathy: Secondary | ICD-10-CM | POA: Diagnosis present

## 2015-05-13 DIAGNOSIS — I6203 Nontraumatic chronic subdural hemorrhage: Secondary | ICD-10-CM | POA: Diagnosis present

## 2015-05-13 DIAGNOSIS — F0390 Unspecified dementia without behavioral disturbance: Secondary | ICD-10-CM | POA: Diagnosis present

## 2015-05-13 DIAGNOSIS — E785 Hyperlipidemia, unspecified: Secondary | ICD-10-CM | POA: Diagnosis present

## 2015-05-13 DIAGNOSIS — I5022 Chronic systolic (congestive) heart failure: Secondary | ICD-10-CM | POA: Diagnosis present

## 2015-05-13 DIAGNOSIS — D696 Thrombocytopenia, unspecified: Secondary | ICD-10-CM | POA: Diagnosis present

## 2015-05-13 DIAGNOSIS — I2510 Atherosclerotic heart disease of native coronary artery without angina pectoris: Secondary | ICD-10-CM | POA: Diagnosis not present

## 2015-05-13 DIAGNOSIS — I252 Old myocardial infarction: Secondary | ICD-10-CM | POA: Diagnosis not present

## 2015-05-13 DIAGNOSIS — I130 Hypertensive heart and chronic kidney disease with heart failure and stage 1 through stage 4 chronic kidney disease, or unspecified chronic kidney disease: Secondary | ICD-10-CM | POA: Diagnosis present

## 2015-05-13 DIAGNOSIS — D693 Immune thrombocytopenic purpura: Secondary | ICD-10-CM | POA: Diagnosis not present

## 2015-05-13 DIAGNOSIS — R400 Somnolence: Secondary | ICD-10-CM | POA: Diagnosis not present

## 2015-05-13 LAB — CBC
HCT: 39.6 % (ref 36.0–46.0)
HEMOGLOBIN: 13.8 g/dL (ref 12.0–15.0)
MCH: 34.1 pg — ABNORMAL HIGH (ref 26.0–34.0)
MCHC: 34.8 g/dL (ref 30.0–36.0)
MCV: 97.8 fL (ref 78.0–100.0)
PLATELETS: 141 10*3/uL — AB (ref 150–400)
RBC: 4.05 MIL/uL (ref 3.87–5.11)
RDW: 13.2 % (ref 11.5–15.5)
WBC: 7.3 10*3/uL (ref 4.0–10.5)

## 2015-05-13 LAB — COMPREHENSIVE METABOLIC PANEL
ALK PHOS: 76 U/L (ref 38–126)
ALT: 12 U/L — AB (ref 14–54)
AST: 21 U/L (ref 15–41)
Albumin: 3.6 g/dL (ref 3.5–5.0)
Anion gap: 7 (ref 5–15)
BUN: 11 mg/dL (ref 6–20)
CALCIUM: 9.1 mg/dL (ref 8.9–10.3)
CHLORIDE: 102 mmol/L (ref 101–111)
CO2: 26 mmol/L (ref 22–32)
CREATININE: 1.11 mg/dL — AB (ref 0.44–1.00)
GFR calc Af Amer: 51 mL/min — ABNORMAL LOW (ref >60)
GFR, EST NON AFRICAN AMERICAN: 44 mL/min — AB (ref >60)
Glucose, Bld: 136 mg/dL — ABNORMAL HIGH (ref 65–99)
Potassium: 3.8 mmol/L (ref 3.5–5.1)
Sodium: 135 mmol/L (ref 135–145)
Total Bilirubin: 1.3 mg/dL — ABNORMAL HIGH (ref 0.3–1.2)
Total Protein: 5.7 g/dL — ABNORMAL LOW (ref 6.5–8.1)

## 2015-05-13 LAB — URINALYSIS W MICROSCOPIC (NOT AT ARMC)
Bilirubin Urine: NEGATIVE
Glucose, UA: NEGATIVE mg/dL
Ketones, ur: NEGATIVE mg/dL
Nitrite: NEGATIVE
Protein, ur: NEGATIVE mg/dL
Specific Gravity, Urine: 1.006 (ref 1.005–1.030)
Urobilinogen, UA: 0.2 mg/dL (ref 0.0–1.0)
pH: 6.5 (ref 5.0–8.0)

## 2015-05-13 LAB — PHOSPHORUS: PHOSPHORUS: 3.2 mg/dL (ref 2.5–4.6)

## 2015-05-13 LAB — TSH: TSH: 4.524 u[IU]/mL — ABNORMAL HIGH (ref 0.350–4.500)

## 2015-05-13 LAB — MAGNESIUM: Magnesium: 1.8 mg/dL (ref 1.7–2.4)

## 2015-05-13 LAB — GLUCOSE, CAPILLARY: Glucose-Capillary: 163 mg/dL — ABNORMAL HIGH (ref 65–99)

## 2015-05-13 MED ORDER — VITAMIN D 1000 UNITS PO TABS
1000.0000 [IU] | ORAL_TABLET | Freq: Every day | ORAL | Status: DC
  Administered 2015-05-13 – 2015-05-14 (×2): 1000 [IU] via ORAL
  Filled 2015-05-13 (×2): qty 1

## 2015-05-13 MED ORDER — FUROSEMIDE 20 MG PO TABS
20.0000 mg | ORAL_TABLET | Freq: Every day | ORAL | Status: DC
  Administered 2015-05-13 – 2015-05-15 (×3): 20 mg via ORAL
  Filled 2015-05-13 (×3): qty 1

## 2015-05-13 MED ORDER — SODIUM CHLORIDE 0.9 % IJ SOLN
3.0000 mL | Freq: Two times a day (BID) | INTRAMUSCULAR | Status: DC
  Administered 2015-05-13 (×2): 3 mL via INTRAVENOUS

## 2015-05-13 MED ORDER — SPIRONOLACTONE 25 MG PO TABS
25.0000 mg | ORAL_TABLET | Freq: Every day | ORAL | Status: DC
  Administered 2015-05-13 – 2015-05-15 (×3): 25 mg via ORAL
  Filled 2015-05-13 (×3): qty 1

## 2015-05-13 MED ORDER — ATORVASTATIN CALCIUM 20 MG PO TABS
20.0000 mg | ORAL_TABLET | Freq: Every day | ORAL | Status: DC
  Administered 2015-05-13 – 2015-05-14 (×2): 20 mg via ORAL
  Filled 2015-05-13 (×2): qty 1

## 2015-05-13 MED ORDER — POTASSIUM CHLORIDE CRYS ER 20 MEQ PO TBCR
20.0000 meq | EXTENDED_RELEASE_TABLET | ORAL | Status: DC
  Administered 2015-05-13 – 2015-05-15 (×2): 20 meq via ORAL
  Filled 2015-05-13 (×2): qty 1

## 2015-05-13 MED ORDER — IRBESARTAN 300 MG PO TABS
300.0000 mg | ORAL_TABLET | Freq: Every day | ORAL | Status: DC
  Administered 2015-05-13 – 2015-05-15 (×3): 300 mg via ORAL
  Filled 2015-05-13 (×3): qty 1

## 2015-05-13 MED ORDER — SERTRALINE HCL 50 MG PO TABS
25.0000 mg | ORAL_TABLET | Freq: Every day | ORAL | Status: DC
  Administered 2015-05-13 – 2015-05-15 (×3): 25 mg via ORAL
  Filled 2015-05-13 (×3): qty 1

## 2015-05-13 MED ORDER — CARVEDILOL 6.25 MG PO TABS
6.2500 mg | ORAL_TABLET | Freq: Two times a day (BID) | ORAL | Status: DC
  Administered 2015-05-13 – 2015-05-15 (×5): 6.25 mg via ORAL
  Filled 2015-05-13 (×5): qty 1

## 2015-05-13 MED ORDER — CALCIUM CARBONATE-VITAMIN D 500-200 MG-UNIT PO TABS
1.0000 | ORAL_TABLET | Freq: Every day | ORAL | Status: DC
  Administered 2015-05-13 – 2015-05-15 (×3): 1 via ORAL
  Filled 2015-05-13 (×3): qty 1

## 2015-05-13 MED ORDER — SODIUM CHLORIDE 0.45 % IV SOLN
INTRAVENOUS | Status: AC
  Administered 2015-05-13 (×2): via INTRAVENOUS

## 2015-05-13 NOTE — Plan of Care (Signed)
Lisa James, is a 81 y.o. female, DOB - 06/02/1928, EOF:[id number]  Patient admitted earlier this morning for mild delirium in the setting of moderate to advanced dementia, history of old subdural bleed which appears to be stable on present CT and MRI.  Discussed with patient's daughter, continue supportive care, no evidence of UTI or stroke, no focal deficits, patient is pleasantly demented. Will initiate PT may require placement. Currently lives in an independent apartment with some assistance with her meals and day to day chores.   Filed Vitals:   05/13/15 0236 05/13/15 0453 05/13/15 1012 05/13/15 1131  BP: 164/71 164/52 111/45 176/70  Pulse: 71 66 66 68  Temp: 98.2 F (36.8 C) 98.6 F (37 C) 99 F (37.2 C)   TempSrc: Oral Oral Oral   Resp: 16 16 17    Height: 4' 10.5" (1.486 m)     Weight: 51.256 kg (113 lb)     SpO2: 100% 98% 97% 98%        Data Review   Micro Results No results found for this or any previous visit (from the past 240 hour(s)).  Radiology Reports Ct Head Wo Contrast  05/12/2015  CLINICAL DATA:  Sudden onset of altered mental status. Stroke symptoms. EXAM: CT HEAD WITHOUT CONTRAST TECHNIQUE: Contiguous axial images were obtained from the base of the skull through the vertex without intravenous contrast. COMPARISON:  Multiple prior head CTs most recently 01/31/2015. Interval exams dating back to 2015 also reviewed. FINDINGS: Chronic large complex left subdural collection is unchanged in appearance compared to prior exam and consistent with calcified subdural hematoma. This is overall unchanged in size and appearance compared to prior exam. The maximal thickness is 2.9 cm. Midline shift of 5 mm is unchanged. Small anterior left frontal subdural collection is also unchanged in size and appearance. Isodense right subdural collection consistent with hygroma, also stable in appearance  without acute component. Generalized atrophy is stable from prior. No hydrocephalus. Chronic small vessel ischemic change, also stable. No CT findings of acute territorial infarct or ischemia. The calvarium is intact. Included paranasal sinuses are well aerated. There is minimal opacification of lower left mastoid air cells. Right mastoid air cells are well aerated. IMPRESSION: 1. Stable chronic large calcifying subdural hematoma, maximal thickness 2.9 cm. Left-to-right midline shift of 5 mm is unchanged. 2. Stable small right subdural hygroma. 3. No acute intracranial abnormality is seen. Electronically Signed   By: Jeb Levering M.D.   On: 05/12/2015 20:23   Mr Brain Wo Contrast  05/12/2015  CLINICAL DATA:  Initial evaluation for acute altered mental status, concern for ischemia. EXAM: MRI HEAD WITHOUT CONTRAST TECHNIQUE: Multiplanar, multiecho pulse sequences of the brain and surrounding structures were obtained without intravenous contrast. COMPARISON:  Prior CT from earlier the same day. FINDINGS: Diffuse prominence of the CSF containing spaces is compatible with generalized age-related cerebral atrophy. Patchy and confluent T2/FLAIR hyperintensity within the periventricular and deep white matter most consistent with chronic small vessel ischemic disease, mild for patient age. Large complex partially calcified chronic subdural hematoma overlying the left frontal convexity again seen. This measures up to 3.0 cm at the level of the low left frontal lobe, and 2.7 cm at the level of the left parietal lobe. This is similar relative to previous studies. There is mild mass effect on the subjacent left cerebral hemisphere with associated left-to-right midline shift of 6 mm at the level of the septum pellucidum. This is also stable. Smaller additional chronic left subdural hematoma  overlying the anterior left frontal lobe is also similar measuring up to 9 mm. Probable small thin chronic subdural hygroma overlies  the right cerebral convexity measuring up to 4 mm in maximal thickness, best appreciated on coronal T2 weighted sequence. No abnormal foci of restricted diffusion to suggest acute intracranial infarct. Normal intravascular flow voids are maintained. Gray-white matter differentiation preserved. No acute intracranial hemorrhage. No mass lesion identified.  No hydrocephalus. Craniocervical junction widely patent. Scattered degenerative spondylolysis present within the visualized upper cervical spine. Incidental note made of a partially empty sella. No acute abnormality about the orbits. Sequela prior bilateral lens extraction noted. Mild mucosal thickening within ethmoidal air cells. Small bilateral mastoid effusions, slightly larger on the left. Inner ear structures normal. Bone marrow signal intensity within normal limits. Scalp soft tissues unremarkable. IMPRESSION: 1. No acute intracranial infarct or other process identified. 2. Large complex calcifying chronic left subdural hematoma measuring up to 3 cm in maximal thickness with associated midline shift of 6 mm, similar to previous studies. Smaller anterior left frontal subdural collection also unchanged. 3. Small chronic subdural hygroma overlying the right cerebral convexity measuring up to 4 mm without mass effect. 4. Atrophy with chronic microvascular ischemic disease. Electronically Signed   By: Jeannine Boga M.D.   On: 05/12/2015 23:16   Dg Chest Portable 1 View  05/12/2015  CLINICAL DATA:  Altered mental status, low-grade fever EXAM: PORTABLE CHEST 1 VIEW COMPARISON:  05/11/2011 FINDINGS: Lungs are clear.  No pleural effusion or pneumothorax. Mild cardiomegaly. Postsurgical changes related to prior CABG. Median sternotomy. IMPRESSION: No evidence of acute cardiopulmonary disease. Electronically Signed   By: Julian Hy M.D.   On: 05/12/2015 21:12    CBC  Recent Labs Lab 05/12/15 1823 05/12/15 2002 05/12/15 2005 05/13/15 0457  WBC  7.2 6.1  --  7.3  HGB 13.6 12.7 13.3 13.8  HCT 40.3 37.2 39.0 39.6  PLT  --  133*  --  141*  MCV 97.4* 98.2  --  97.8  MCH 32.9* 33.5  --  34.1*  MCHC 33.8 34.1  --  34.8  RDW  --  13.1  --  13.2  LYMPHSABS  --  1.2  --   --   MONOABS  --  0.4  --   --   EOSABS  --  0.7  --   --   BASOSABS  --  0.0  --   --     Chemistries   Recent Labs Lab 05/12/15 2002 05/12/15 2005 05/13/15 0457  NA 137 138 135  K 3.9 3.8 3.8  CL 105 102 102  CO2 23  --  26  GLUCOSE 172* 168* 136*  BUN 13 16 11   CREATININE 1.28* 1.20* 1.11*  CALCIUM 9.1  --  9.1  MG  --   --  1.8  AST 22  --  21  ALT 12*  --  12*  ALKPHOS 77  --  76  BILITOT 1.2  --  1.3*   ------------------------------------------------------------------------------------------------------------------ estimated creatinine clearance is 26.8 mL/min (by C-G formula based on Cr of 1.11). ------------------------------------------------------------------------------------------------------------------ No results for input(s): HGBA1C in the last 72 hours. ------------------------------------------------------------------------------------------------------------------ No results for input(s): CHOL, HDL, LDLCALC, TRIG, CHOLHDL, LDLDIRECT in the last 72 hours. ------------------------------------------------------------------------------------------------------------------  Recent Labs  05/13/15 0457  TSH 4.524*   ------------------------------------------------------------------------------------------------------------------ No results for input(s): VITAMINB12, FOLATE, FERRITIN, TIBC, IRON, RETICCTPCT in the last 72 hours.  Coagulation profile  Recent Labs Lab 05/12/15 2002  INR 1.19  No results for input(s): DDIMER in the last 72 hours.  Cardiac Enzymes No results for input(s): CKMB, TROPONINI, MYOGLOBIN in the last 168 hours.  Invalid input(s):  CK ------------------------------------------------------------------------------------------------------------------ Invalid input(s): POCBNP   Lala Lund K M.D on 05/13/2015 at 1:38 PM  Between 7am to 7pm - Pager - [phone], After 7pm go to [email or web address] - password [facility]  [facility]  - Office  [phone]

## 2015-05-13 NOTE — Progress Notes (Signed)
Attempted to call for report. ED RN unavailable at this moment. Left this RN's name and number to call back. Oren Beckmann, RN

## 2015-05-13 NOTE — Progress Notes (Signed)
Admission note:   Arrival Method: pt arrived on stretcher from ED Mental Orientation: Alert and oriented to person and place Telemetry: telebox [id number] applied. CCMD notified Assessment: completed see flowsheet  Skin: skin dry and intact, no open areas.Skin assessed by this RN and Brett Fairy DeShazo, RN IV: left fore arm site clean dry and intact 0.45% NS infusing  Pain: pt denies pain Tubes: none Safety Measures: bed alarm on , non skid socks on, bed in lowest position Fall Prevention Safety Plan: reviewed with pt Admission Screening: in progress 6700 Orientation: Patient has been oriented to the unit, staff and to the room. Pt lying comfortably in bed, call light within reach. Orders reviewed and implemented. Will continue to monitor.  Oren Beckmann, RN

## 2015-05-13 NOTE — H&P (Signed)
[facility] History and Physical  Lisa James VUY:[id number] DOB: Nov 22, 1927 DOA: 05/12/2015  Referring physician: Hoyle Sauer, MD PCP: Jenny Reichmann, MD   Chief Complaint: Altered mental status  HPI: Lisa James is a 81 y.o. female with a past medical history of hypertension, CAD, CHF, mitral regurgitation, aortic stenosis, type 2 diabetes, hyperlipidemia, ITP who was brought to the emergency department by her niece due to increased confusion since last Friday. Her niece stated to the emergency department, that a neighbor was concerned and called her about her aunt. She is states that recently she has been finding the patient's pills lying down on the floor, and apparently she has not taken them in several days. The patient is afebrile, denies pain, and does not seem to be in no acute distress.   Review of Systems:  Unable to review.  Past Medical History  Diagnosis Date  . Acute MI, lateral wall ([place])   . Chronic systolic heart failure ([place])   . Ischemic heart disease   . Mitral regurgitation   . HTN (hypertension)   . 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 Immune thrombocytopenic purpura ([place]) 01/21/2013  . Coronary artery disease     sees Dr Claiborne Billings every 6 months  . DM (diabetes mellitus) ([place])     type 2 ; diet controlled  . Thrombocytopenia ([place])     sees Dr Marin Olp every 2 weeks ;receives Nplate prn  . Subdural hematoma, post-traumatic ([place]) 2015    sees Dr Sherwood Gambler every 6 months  . Aortic stenosis   . Chronic kidney disease    Past Surgical History  Procedure Laterality Date  . Coronary artery bypass graft  11/20/2007    x5  . Bone marrow biopsy  11/22/2002    left posterior iliac crest bone marrow biopsy and aspirate  . Submental lymph node excisional biopsy  10/22/2002  . Eye surgery    . Cataract extraction w/ intraocular lens  implant, bilateral Bilateral    Social History:  reports that she has never smoked. She has never used  smokeless tobacco. She reports that she does not drink alcohol or use illicit drugs.  Allergies  Allergen Reactions  . Ace Inhibitors Cough    Family History  Problem Relation Age of Onset  . Heart attack Father   . Diabetes Father     Prior to Admission medications   Medication Sig Start Date End Date Taking? Authorizing Provider  atorvastatin (LIPITOR) 20 MG tablet TAKE 1 TABLET BY MOUTH EVERY DAY 05/04/15  Yes Chelle Jeffery, PA-C  Calcium Carbonate-Vitamin D (CALCIUM-VITAMIN D) 500-200 MG-UNIT per tablet Take 1 tablet by mouth daily.    Yes Historical Provider, MD  carvedilol (COREG) 6.25 MG tablet TAKE 1 TABLET BY MOUTH TWICE A DAY WITH A MEAL 05/04/15  Yes Chelle Jeffery, PA-C  Cholecalciferol (VITAMIN D) 1000 UNITS capsule Take 1,000 Units by mouth at bedtime.    Yes Historical Provider, MD  furosemide (LASIX) 20 MG tablet Take 1 tablet (20 mg total) by mouth daily. 03/13/15  Yes Darlyne Russian, MD  irbesartan (AVAPRO) 300 MG tablet Take 1 tablet (300 mg total) by mouth daily. 03/13/15  Yes Troy Sine, MD  Multiple Vitamins-Minerals (PRESERVISION AREDS PO) Take 1 tablet by mouth daily.   Yes Historical Provider, MD  potassium chloride SA (K-DUR,KLOR-CON) 20 MEQ tablet Take 20 mEq by mouth every other day.   Yes Historical Provider, MD  romiPLOStim (  NPLATE) 250 MCG injection Inject 1 mcg/kg into the skin as needed (every 2-3 weeks for low platelets). Given @ Oncology   Yes Historical Provider, MD  sertraline (ZOLOFT) 25 MG tablet TAKE 1 TABLET BY MOUTH EVERY DAY 05/04/15  Yes Darlyne Russian, MD  spironolactone (ALDACTONE) 25 MG tablet Take 25 mg by mouth daily.   Yes Historical Provider, MD  KLOR-CON M20 20 MEQ tablet TAKE 1 TABLET EVERY DAY *NEEDS OFFICE VISIT FOR MORE REFILLS* Patient not taking: Reported on 05/13/2015 11/15/14   Darlyne Russian, MD  KLOR-CON M20 20 MEQ tablet TAKE 1 TABLET BY MOUTH ONCE DAILY Patient not taking: Reported on 05/13/2015 02/09/15   Darlyne Russian, MD    Physical Exam: Filed Vitals:   05/12/15 2350 05/13/15 0000 05/13/15 0030 05/13/15 0100  BP:  170/80 143/53 145/59  Pulse:  63 66 74  Temp: 98.8 F (37.1 C)     TempSrc:      Resp:  _0 Weight:      SpO2:  99% 99% 97%    Wt Readings from Last 3 Encounters:  05/12/15 52.164 kg (115 lb)  05/12/15 52.527 kg (115 lb 12.8 oz)  05/02/15 51.256 kg (113 lb)    General:  Appears calm and comfortable Eyes: PERRL, normal lids, irises & conjunctiva ENT: grossly normal hearing, lips & tongue Neck: no LAD, masses or thyromegaly Cardiovascular: RRR, 4/6 systolic murmur. No LE edema. Telemetry: SR, no arrhythmias  Respiratory: CTA bilaterally, no w/r/r. Normal respiratory effort. Abdomen: soft, ntnd Skin: no rash or induration seen on limited exam Musculoskeletal: grossly normal tone BUE/BLE Psychiatric: Awake, alert, oriented to self, oriented to place with some difficulty remembering where she is at, disoriented to time and situation. Neurologic: grossly non-focal.          Labs on Admission:  Basic Metabolic Panel:  Recent Labs Lab 05/12/15 2002 05/12/15 2005  NA 137 138  K 3.9 3.8  CL 105 102  CO2 23  --   GLUCOSE 172* 168*  BUN 13 16  CREATININE 1.28* 1.20*  CALCIUM 9.1  --    Liver Function Tests:  Recent Labs Lab 05/12/15 2002  AST 22  ALT 12*  ALKPHOS 77  BILITOT 1.2  PROT 5.8*  ALBUMIN 3.6   CBC:  Recent Labs Lab 05/12/15 1823 05/12/15 2002 05/12/15 2005  WBC 7.2 6.1  --   NEUTROABS  --  3.7  --   HGB 13.6 12.7 13.3  HCT 40.3 37.2 39.0  MCV 97.4* 98.2  --   PLT  --  133*  --     CBG:  Recent Labs Lab 05/12/15 2023  GLUCAP 141*    Radiological Exams on Admission: Ct Head Wo Contrast  05/12/2015  CLINICAL DATA:  Sudden onset of altered mental status. Stroke symptoms. EXAM: CT HEAD WITHOUT CONTRAST TECHNIQUE: Contiguous axial images were obtained from the base of the skull through the vertex without intravenous contrast.  COMPARISON:  Multiple prior head CTs most recently 01/31/2015. Interval exams dating back to 2015 also reviewed. FINDINGS: Chronic large complex left subdural collection is unchanged in appearance compared to prior exam and consistent with calcified subdural hematoma. This is overall unchanged in size and appearance compared to prior exam. The maximal thickness is 2.9 cm. Midline shift of 5 mm is unchanged. Small anterior left frontal subdural collection is also unchanged in size and appearance. Isodense right subdural collection consistent with hygroma, also stable in appearance without acute component.  Generalized atrophy is stable from prior. No hydrocephalus. Chronic small vessel ischemic change, also stable. No CT findings of acute territorial infarct or ischemia. The calvarium is intact. Included paranasal sinuses are well aerated. There is minimal opacification of lower left mastoid air cells. Right mastoid air cells are well aerated. IMPRESSION: 1. Stable chronic large calcifying subdural hematoma, maximal thickness 2.9 cm. Left-to-right midline shift of 5 mm is unchanged. 2. Stable small right subdural hygroma. 3. No acute intracranial abnormality is seen. Electronically Signed   By: Jeb Levering M.D.   On: 05/12/2015 20:23   Mr Brain Wo Contrast  05/12/2015  CLINICAL DATA:  Initial evaluation for acute altered mental status, concern for ischemia. EXAM: MRI HEAD WITHOUT CONTRAST TECHNIQUE: Multiplanar, multiecho pulse sequences of the brain and surrounding structures were obtained without intravenous contrast. COMPARISON:  Prior CT from earlier the same day. FINDINGS: Diffuse prominence of the CSF containing spaces is compatible with generalized age-related cerebral atrophy. Patchy and confluent T2/FLAIR hyperintensity within the periventricular and deep white matter most consistent with chronic small vessel ischemic disease, mild for patient age. Large complex partially calcified chronic subdural  hematoma overlying the left frontal convexity again seen. This measures up to 3.0 cm at the level of the low left frontal lobe, and 2.7 cm at the level of the left parietal lobe. This is similar relative to previous studies. There is mild mass effect on the subjacent left cerebral hemisphere with associated left-to-right midline shift of 6 mm at the level of the septum pellucidum. This is also stable. Smaller additional chronic left subdural hematoma overlying the anterior left frontal lobe is also similar measuring up to 9 mm. Probable small thin chronic subdural hygroma overlies the right cerebral convexity measuring up to 4 mm in maximal thickness, best appreciated on coronal T2 weighted sequence. No abnormal foci of restricted diffusion to suggest acute intracranial infarct. Normal intravascular flow voids are maintained. Gray-white matter differentiation preserved. No acute intracranial hemorrhage. No mass lesion identified.  No hydrocephalus. Craniocervical junction widely patent. Scattered degenerative spondylolysis present within the visualized upper cervical spine. Incidental note made of a partially empty sella. No acute abnormality about the orbits. Sequela prior bilateral lens extraction noted. Mild mucosal thickening within ethmoidal air cells. Small bilateral mastoid effusions, slightly larger on the left. Inner ear structures normal. Bone marrow signal intensity within normal limits. Scalp soft tissues unremarkable. IMPRESSION: 1. No acute intracranial infarct or other process identified. 2. Large complex calcifying chronic left subdural hematoma measuring up to 3 cm in maximal thickness with associated midline shift of 6 mm, similar to previous studies. Smaller anterior left frontal subdural collection also unchanged. 3. Small chronic subdural hygroma overlying the right cerebral convexity measuring up to 4 mm without mass effect. 4. Atrophy with chronic microvascular ischemic disease. Electronically  Signed   By: Jeannine Boga M.D.   On: 05/12/2015 23:16   Dg Chest Portable 1 View  05/12/2015  CLINICAL DATA:  Altered mental status, low-grade fever EXAM: PORTABLE CHEST 1 VIEW COMPARISON:  05/11/2011 FINDINGS: Lungs are clear.  No pleural effusion or pneumothorax. Mild cardiomegaly. Postsurgical changes related to prior CABG. Median sternotomy. IMPRESSION: No evidence of acute cardiopulmonary disease. Electronically Signed   By: Julian Hy M.D.   On: 05/12/2015 21:12    Echocardiogram: O2 18 2016  LV EF: 60% -  65%  ------------------------------------------------------------------- Indications:   414.01 Coronary atherosclerosis - native artery.  ------------------------------------------------------------------- History:  PMH: Hyperlipidemia. Coronary artery disease. PMH: Myocardial infarction. Risk  factors: Hypertension. Diabetes mellitus.  ------------------------------------------------------------------- Study Conclusions  - Left ventricle: The cavity size was normal. Wall thickness was increased in a pattern of mild LVH. Systolic function was normal. The estimated ejection fraction was in the range of 60% to 65%. Wall motion was normal; there were no regional wall motion abnormalities. Doppler parameters are consistent with abnormal left ventricular relaxation (grade 1 diastolic dysfunction). Doppler parameters are consistent with high ventricular filling pressure. - Aortic valve: Cusp separation was reduced. There was mild stenosis. There was trivial regurgitation. - Mitral valve: Severely calcified annulus. There was moderate regurgitation. - Left atrium: The atrium was moderately dilated. - Tricuspid valve: There was moderate regurgitation.  Impressions:  - Normal LV function; grade 1 diastolic dysfunction; moderate LAE; severe MAC with moderate MR; calcified aortic valve with mild AS and trivial AI; moderate  TR.    EKG: Independently reviewed. Vent. rate 61 BPM PR interval 222 ms QRS duration 122 ms QT/QTc 436/438 ms P-R-T axes 44 -48 84 Sinus rhythm with 1st degree A-V block Left anterior fascicular block Left ventricular hypertrophy with QRS widening and repolarization abnormality Abnormal ECG No significant change since last tracing  Assessment/Plan Principal Problem:   Altered mental status Admit to telemetry. Continue neuro checks. Resume home medications. Consult social services.  Active Problems:   HYPERLIPIDEMIA TYPE IIB / III Resume Lipitor. Monitor LFTs periodically.     MITRAL REGURGITATION   CAD (coronary artery disease) Stable. Resume carvedilol, angiotensin receptor blocker and spironolactone. Monitor potassium level, since the patient is also on potassium supplementation.    HYPERTENSION, BENIGN Resume carvedilol, angiotensin receptor blocker and spironolactone. Monitor blood pressure, electrolytes and renal function.    Diabetes mellitus (HCC) CBG monitoring Regular insulin sliding scale if needed.    Subdural hematoma (HCC) No changes from previous studies.    ITP (idiopathic thrombocytopenic purpura) Monitor platelet count. Nplate injections as per oncology.    Code Status: Full code. DVT Prophylaxis: SCDs. Family Communication:  Disposition Plan: Admit to telemetry, neuro checks and social services evaluation.  Time spent: Over 70 minutes were spent during the process of his admission.  Reubin Milan [facility] Pager [phone].

## 2015-05-14 DIAGNOSIS — D693 Immune thrombocytopenic purpura: Secondary | ICD-10-CM

## 2015-05-14 DIAGNOSIS — R400 Somnolence: Secondary | ICD-10-CM

## 2015-05-14 DIAGNOSIS — I10 Essential (primary) hypertension: Secondary | ICD-10-CM

## 2015-05-14 DIAGNOSIS — I080 Rheumatic disorders of both mitral and aortic valves: Secondary | ICD-10-CM

## 2015-05-14 DIAGNOSIS — I2510 Atherosclerotic heart disease of native coronary artery without angina pectoris: Secondary | ICD-10-CM

## 2015-05-14 LAB — GLUCOSE, CAPILLARY: Glucose-Capillary: 99 mg/dL (ref 65–99)

## 2015-05-14 LAB — URINE CULTURE
Colony Count: NO GROWTH
Organism ID, Bacteria: NO GROWTH

## 2015-05-14 MED ORDER — ENOXAPARIN SODIUM 30 MG/0.3ML ~~LOC~~ SOLN: 30.0000 mg | SUBCUTANEOUS | Status: DC

## 2015-05-14 NOTE — Evaluation (Signed)
Physical Therapy Evaluation Patient Details Name: KESLEY MULLENS MRN: [id number] DOB: November 17, 1927 Today's Date: 05/14/2015   History of Present Illness  Lisa James is a 81 y.o. female with a past medical history of hypertension, CAD, CHF, mitral regurgitation, aortic stenosis, type 2 diabetes, hyperlipidemia, ITP who was brought to the emergency department by her niece due to increased confusion since last Friday.   Clinical Impression  Pt admitted with/for AMS.  Pt currently limited functionally due to the problems listed below.  (see problems list.)  Pt will benefit from PT to maximize function and safety to be able to get home safely with available assist.  Pt should have a minute step up in care to include meds, if that continues to be a problem.     Follow Up Recommendations No PT follow up    Equipment Recommendations  None recommended by PT    Recommendations for Other Services       Precautions / Restrictions Precautions Precautions:  (minimal fall risk)      Mobility  Bed Mobility Overal bed mobility: Modified Independent                Transfers Overall transfer level: Modified independent                  Ambulation/Gait Ambulation/Gait assistance: Supervision Ambulation Distance (Feet): 160 Feet Assistive device: None Gait Pattern/deviations: Step-through pattern Gait velocity: slower Gait velocity interpretation: Below normal speed for age/gender General Gait Details: generally steady, but a little stiff and guarded  Stairs            Wheelchair Mobility    Modified Rankin (Stroke Patients Only)       Balance Overall balance assessment: Needs assistance Sitting-balance support: No upper extremity supported Sitting balance-Leahy Scale: Good     Standing balance support: No upper extremity supported Standing balance-Leahy Scale: Fair                               Pertinent Vitals/Pain Pain Assessment:  No/denies pain    Home Living Family/patient expects to be discharged to:: Private residence Living Arrangements: Alone Available Help at Discharge: Available PRN/intermittently Type of Home: Assisted living Home Access: Elevator;Level entry     Home Layout: One level Home Equipment: None      Prior Function Level of Independence: Independent               Hand Dominance        Extremity/Trunk Assessment   Upper Extremity Assessment: Overall WFL for tasks assessed           Lower Extremity Assessment: Overall WFL for tasks assessed         Communication   Communication: No difficulties  Cognition Arousal/Alertness: Awake/alert Behavior During Therapy: WFL for tasks assessed/performed Overall Cognitive Status: Within Functional Limits for tasks assessed                      General Comments      Exercises        Assessment/Plan    PT Assessment Patient needs continued PT services  PT Diagnosis Altered mental status;Other (comment);Generalized weakness (decr activity tolerance)   PT Problem List Decreased strength;Decreased activity tolerance;Decreased safety awareness;Decreased balance  PT Treatment Interventions Gait training;Functional mobility training;Therapeutic activities;Patient/family education;Balance training   PT Goals (Current goals can be found in the Care Plan section) Acute Rehab PT Goals  Patient Stated Goal: Want to get back home PT Goal Formulation: With patient Time For Goal Achievement: 05/21/15 Potential to Achieve Goals: Good    Frequency Min 3X/week   Barriers to discharge        Co-evaluation               End of Session   Activity Tolerance: Patient tolerated treatment well Patient left: in bed;with call bell/phone within reach;with SCD's reapplied Nurse Communication: Mobility status         Time: 3837-7939 PT Time Calculation (min) (ACUTE ONLY): 19 min   Charges:   PT Evaluation $Initial  PT Evaluation Tier I: 1 Procedure     PT G Codes:        Lizandro Spellman, Tessie Fass 05/14/2015, 6:08 PM 05/14/2015  Donnella Sham, [place] [phone]  (pager)

## 2015-05-14 NOTE — Progress Notes (Signed)
Patient Demographics:    Lisa James, is a 81 y.o. female, DOB - 25-Jul-1927, ZRA:[id number]  Admit date - 05/12/2015   Admitting Physician Reubin Milan, MD  Outpatient Primary MD for the patient is DAUB, Lina Sayre, MD  LOS - 1   Chief Complaint  Patient presents with  . Altered Mental Status        Subjective:    Lisa James today has, No headache, No chest pain, No abdominal pain - No Nausea, No new weakness tingling or numbness, No Cough - SOB.     Assessment  & Plan :    1. Mild delirium in a patient with moderate to advanced dementia. CT head and MRI brain nonacute with chronic subdural hematoma and small vessel disease, no signs of infection, UA chest x-ray stable. Patient has no focal signs. Is presently demented. It is likely having gradually progressive dementia with bouts of delirium. Currently appears comfortable and at baseline. So far living in an assisted living type setting. May require SNF. PT and social worker consulted. Niece updated.   2. Dyslipidemia. On statin continue.   3. History of CAD. Continue on Coreg, not on aspirin due to subdural hematoma history.   4. Essential hypertension. On Coreg, ARB and Aldactone. Continue.   5. Chronic subdural hematoma. Stable   6. Depression on Zoloft continue.    Code Status : Full  Family Communication  : Niece over the phone  Disposition Plan  : To be decided may require SNF due to advancing dementia  Consults  :  None  Procedures  :  CT MRI brain nonacute with chronic subdural hematoma. Along with small vessel disease.  DVT Prophylaxis  :  Lovenox   Lab Results  Component Value Date   PLT 141* 05/13/2015    Inpatient Medications  Scheduled Meds: . atorvastatin  20 mg Oral q1800  . calcium-vitamin D   1 tablet Oral Daily  . carvedilol  6.25 mg Oral BID WC  . cholecalciferol  1,000 Units Oral QHS  . furosemide  20 mg Oral Daily  . irbesartan  300 mg Oral Daily  . potassium chloride SA  20 mEq Oral QODAY  . sertraline  25 mg Oral Daily  . sodium chloride  3 mL Intravenous Q12H  . spironolactone  25 mg Oral Daily   Continuous Infusions:  PRN Meds:.  Antibiotics  :     Anti-infectives    None        Objective:   Filed Vitals:   05/13/15 1131 05/13/15 2119 05/14/15 0618 05/14/15 0834  BP: 176/70 149/88 170/38 129/52  Pulse: 68 89 59 65  Temp:  97.9 F (36.6 C) 97.8 F (36.6 C) 98.8 F (37.1 C)  TempSrc:  Oral Oral Oral  Resp:  17 17 17   Height:      Weight:      SpO2: 98% 95% 98% 96%    Wt Readings from Last 3 Encounters:  05/13/15 51.256 kg (113 lb)  05/12/15 52.527 kg (115 lb 12.8 oz)  05/02/15 51.256 kg (113 lb)     Intake/Output Summary (Last 24 hours) at 05/14/15 1044 Last data filed at 05/14/15 0835  Gross per 24 hour  Intake    360  ml  Output    450 ml  Net    -90 ml     Physical Exam  Awake Alert but pleasantly confused, No new F.N deficits, Normal affect [place].AT,PERRAL Supple Neck,No JVD, No cervical lymphadenopathy appriciated.  Symmetrical Chest wall movement, Good air movement bilaterally, CTAB RRR,No Gallops,Rubs or new Murmurs, No Parasternal Heave +ve B.Sounds, Abd Soft, No tenderness, No organomegaly appriciated, No rebound - guarding or rigidity. No Cyanosis, Clubbing or edema, No new Rash or bruise       Data Review:   Micro Results No results found for this or any previous visit (from the past 240 hour(s)).  Radiology Reports Ct Head Wo Contrast  05/12/2015  CLINICAL DATA:  Sudden onset of altered mental status. Stroke symptoms. EXAM: CT HEAD WITHOUT CONTRAST TECHNIQUE: Contiguous axial images were obtained from the base of the skull through the vertex without intravenous contrast. COMPARISON:  Multiple prior head CTs most  recently 01/31/2015. Interval exams dating back to 2015 also reviewed. FINDINGS: Chronic large complex left subdural collection is unchanged in appearance compared to prior exam and consistent with calcified subdural hematoma. This is overall unchanged in size and appearance compared to prior exam. The maximal thickness is 2.9 cm. Midline shift of 5 mm is unchanged. Small anterior left frontal subdural collection is also unchanged in size and appearance. Isodense right subdural collection consistent with hygroma, also stable in appearance without acute component. Generalized atrophy is stable from prior. No hydrocephalus. Chronic small vessel ischemic change, also stable. No CT findings of acute territorial infarct or ischemia. The calvarium is intact. Included paranasal sinuses are well aerated. There is minimal opacification of lower left mastoid air cells. Right mastoid air cells are well aerated. IMPRESSION: 1. Stable chronic large calcifying subdural hematoma, maximal thickness 2.9 cm. Left-to-right midline shift of 5 mm is unchanged. 2. Stable small right subdural hygroma. 3. No acute intracranial abnormality is seen. Electronically Signed   By: Jeb Levering M.D.   On: 05/12/2015 20:23   Mr Brain Wo Contrast  05/12/2015  CLINICAL DATA:  Initial evaluation for acute altered mental status, concern for ischemia. EXAM: MRI HEAD WITHOUT CONTRAST TECHNIQUE: Multiplanar, multiecho pulse sequences of the brain and surrounding structures were obtained without intravenous contrast. COMPARISON:  Prior CT from earlier the same day. FINDINGS: Diffuse prominence of the CSF containing spaces is compatible with generalized age-related cerebral atrophy. Patchy and confluent T2/FLAIR hyperintensity within the periventricular and deep white matter most consistent with chronic small vessel ischemic disease, mild for patient age. Large complex partially calcified chronic subdural hematoma overlying the left frontal  convexity again seen. This measures up to 3.0 cm at the level of the low left frontal lobe, and 2.7 cm at the level of the left parietal lobe. This is similar relative to previous studies. There is mild mass effect on the subjacent left cerebral hemisphere with associated left-to-right midline shift of 6 mm at the level of the septum pellucidum. This is also stable. Smaller additional chronic left subdural hematoma overlying the anterior left frontal lobe is also similar measuring up to 9 mm. Probable small thin chronic subdural hygroma overlies the right cerebral convexity measuring up to 4 mm in maximal thickness, best appreciated on coronal T2 weighted sequence. No abnormal foci of restricted diffusion to suggest acute intracranial infarct. Normal intravascular flow voids are maintained. Gray-white matter differentiation preserved. No acute intracranial hemorrhage. No mass lesion identified.  No hydrocephalus. Craniocervical junction widely patent. Scattered degenerative spondylolysis present within the  visualized upper cervical spine. Incidental note made of a partially empty sella. No acute abnormality about the orbits. Sequela prior bilateral lens extraction noted. Mild mucosal thickening within ethmoidal air cells. Small bilateral mastoid effusions, slightly larger on the left. Inner ear structures normal. Bone marrow signal intensity within normal limits. Scalp soft tissues unremarkable. IMPRESSION: 1. No acute intracranial infarct or other process identified. 2. Large complex calcifying chronic left subdural hematoma measuring up to 3 cm in maximal thickness with associated midline shift of 6 mm, similar to previous studies. Smaller anterior left frontal subdural collection also unchanged. 3. Small chronic subdural hygroma overlying the right cerebral convexity measuring up to 4 mm without mass effect. 4. Atrophy with chronic microvascular ischemic disease. Electronically Signed   By: Jeannine Boga  M.D.   On: 05/12/2015 23:16   Dg Chest Portable 1 View  05/12/2015  CLINICAL DATA:  Altered mental status, low-grade fever EXAM: PORTABLE CHEST 1 VIEW COMPARISON:  05/11/2011 FINDINGS: Lungs are clear.  No pleural effusion or pneumothorax. Mild cardiomegaly. Postsurgical changes related to prior CABG. Median sternotomy. IMPRESSION: No evidence of acute cardiopulmonary disease. Electronically Signed   By: Julian Hy M.D.   On: 05/12/2015 21:12     CBC  Recent Labs Lab 05/12/15 1823 05/12/15 2002 05/12/15 2005 05/13/15 0457  WBC 7.2 6.1  --  7.3  HGB 13.6 12.7 13.3 13.8  HCT 40.3 37.2 39.0 39.6  PLT  --  133*  --  141*  MCV 97.4* 98.2  --  97.8  MCH 32.9* 33.5  --  34.1*  MCHC 33.8 34.1  --  34.8  RDW  --  13.1  --  13.2  LYMPHSABS  --  1.2  --   --   MONOABS  --  0.4  --   --   EOSABS  --  0.7  --   --   BASOSABS  --  0.0  --   --     Chemistries   Recent Labs Lab 05/12/15 2002 05/12/15 2005 05/13/15 0457  NA 137 138 135  K 3.9 3.8 3.8  CL 105 102 102  CO2 23  --  26  GLUCOSE 172* 168* 136*  BUN 13 16 11   CREATININE 1.28* 1.20* 1.11*  CALCIUM 9.1  --  9.1  MG  --   --  1.8  AST 22  --  21  ALT 12*  --  12*  ALKPHOS 77  --  76  BILITOT 1.2  --  1.3*   ------------------------------------------------------------------------------------------------------------------ estimated creatinine clearance is 26.8 mL/min (by C-G formula based on Cr of 1.11). ------------------------------------------------------------------------------------------------------------------ No results for input(s): HGBA1C in the last 72 hours. ------------------------------------------------------------------------------------------------------------------ No results for input(s): CHOL, HDL, LDLCALC, TRIG, CHOLHDL, LDLDIRECT in the last 72 hours. ------------------------------------------------------------------------------------------------------------------  Recent Labs   05/13/15 0457  TSH 4.524*   ------------------------------------------------------------------------------------------------------------------ No results for input(s): VITAMINB12, FOLATE, FERRITIN, TIBC, IRON, RETICCTPCT in the last 72 hours.  Coagulation profile  Recent Labs Lab 05/12/15 2002  INR 1.19    No results for input(s): DDIMER in the last 72 hours.  Cardiac Enzymes No results for input(s): CKMB, TROPONINI, MYOGLOBIN in the last 168 hours.  Invalid input(s): CK ------------------------------------------------------------------------------------------------------------------ Invalid input(s): POCBNP   Time Spent in minutes   35   Carroll Ranney K M.D on 05/14/2015 at 10:44 AM  Between 7am to 7pm - Pager - [phone]  After 7pm go to [email or web address] - password [facility]  [facility] -  Office  [phone]

## 2015-05-15 LAB — HEMOGLOBIN A1C
Hgb A1c MFr Bld: 6.2 % — ABNORMAL HIGH (ref 4.8–5.6)
MEAN PLASMA GLUCOSE: 131 mg/dL

## 2015-05-15 NOTE — Progress Notes (Signed)
Pt discharge instructions given, pt and family verbalized understanding. VSS. Denies pain. Pt left floor ambulating accompanied by family.

## 2015-05-15 NOTE — Care Management Note (Signed)
Case Management Note  Patient Details  Name: Lisa James MRN: [id number] Date of Birth: 1927-08-23  Subjective/Objective:      CM following for progression and d/c planning              Action/Plan: 05/15/2015 Met with pt and called pt niece Lisa James re d/c plan. Pt will return to [place] where she lives in an apartment , in independent living. Per the pt niece the pt is independent and was doing well until she stopped taking her medications. Now with increasing confusing her niece has plans to hire a person who comes to the independent living center and assist with medications. Pt and niece are declining [place] services, per PT this pt does not need HHPT services and the niece does not feel that she needs a HHRN , Education officer, museum or aide. Per the niece and the pt she has no DME needs. Pt niece will pick the pt up from the hospital and return her to her apartment at [facility] this afternoon.   Expected Discharge Date:   05/15/2015               Expected Discharge Plan:  Home/Self Care  In-House Referral:  NA  Discharge planning Services  CM Consult  Post Acute Care Choice:  Durable Medical Equipment Choice offered to:  Patient  DME Arranged:  N/A DME Agency:  NA  HH Arranged:  NA HH Agency:  NA  Status of Service:  Completed, signed off  Medicare Important Message Given:    Date Medicare IM Given:    Medicare IM give by:    Date Additional Medicare IM Given:    Additional Medicare Important Message give by:     If discussed at [place] of Stay Meetings, dates discussed:    Additional Comments:  Adron Bene, RN 05/15/2015, 11:14 AM

## 2015-05-15 NOTE — Discharge Instructions (Signed)
Follow with Primary MD DAUB, STEVE A, MD in 7 days   Get CBC, CMP, 2 view Chest X ray checked  by Primary MD next visit.    Activity: As tolerated with Full fall precautions use walker/cane & assistance as needed   Disposition Home    Diet: Heart Healthy with feeding assistance and aspiration precautions.  For Heart failure patients - Check your Weight same time everyday, if you gain over 2 pounds, or you develop in leg swelling, experience more shortness of breath or chest pain, call your Primary MD immediately. Follow Cardiac Low Salt Diet and 1.5 lit/day fluid restriction.   On your next visit with your primary care physician please Get Medicines reviewed and adjusted.   Please request your Prim.MD to go over all Hospital Tests and Procedure/Radiological results at the follow up, please get all Hospital records sent to your Prim MD by signing hospital release before you go home.   If you experience worsening of your admission symptoms, develop shortness of breath, life threatening emergency, suicidal or homicidal thoughts you must seek medical attention immediately by calling 911 or calling your MD immediately  if symptoms less severe.  You Must read complete instructions/literature along with all the possible adverse reactions/side effects for all the Medicines you take and that have been prescribed to you. Take any new Medicines after you have completely understood and accpet all the possible adverse reactions/side effects.   Do not drive, operating heavy machinery, perform activities at heights, swimming or participation in water activities or provide baby sitting services if your were admitted for syncope or siezures until you have seen by Primary MD or a Neurologist and advised to do so again.  Do not drive when taking Pain medications.    Do not take more than prescribed Pain, Sleep and Anxiety Medications  Special Instructions: If you have smoked or chewed Tobacco  in the  last 2 yrs please stop smoking, stop any regular Alcohol  and or any Recreational drug use.  Wear Seat belts while driving.   Please note  You were cared for by a hospitalist during your hospital stay. If you have any questions about your discharge medications or the care you received while you were in the hospital after you are discharged, you can call the unit and asked to speak with the hospitalist on call if the hospitalist that took care of you is not available. Once you are discharged, your primary care physician will handle any further medical issues. Please note that NO REFILLS for any discharge medications will be authorized once you are discharged, as it is imperative that you return to your primary care physician (or establish a relationship with a primary care physician if you do not have one) for your aftercare needs so that they can reassess your need for medications and monitor your lab values.

## 2015-05-15 NOTE — Discharge Summary (Signed)
Lisa James, is a 81 y.o. female  DOB 11-26-1927  MRN [id number].  Admission date:  05/12/2015  Admitting Physician  Reubin Milan, MD  Discharge Date:  05/15/2015   Primary MD  Jenny Reichmann, MD  Recommendations for primary care physician for things to follow:   Repeat CBC, BMP in a week. Continue outpatient evaluation for SNF placement. She did not qualify per PT evaluation for SNF this admission.   Admission Diagnosis  Hypertensive encephalopathy [I67.4] Altered mental state [R41.82] Altered mental status, unspecified altered mental status type [R41.82]   Discharge Diagnosis  Hypertensive encephalopathy [I67.4] Altered mental state [R41.82] Altered mental status, unspecified altered mental status type [R41.82]     Principal Problem:   Altered mental status Active Problems:   HYPERLIPIDEMIA TYPE IIB / III   MITRAL REGURGITATION   HYPERTENSION, BENIGN   Diabetes mellitus (HCC)   CAD (coronary artery disease)   Subdural hematoma (HCC)   ITP (idiopathic thrombocytopenic purpura)      Past Medical History  Diagnosis Date  . Acute MI, lateral wall ([place])   . Chronic systolic heart failure ([place])   . Ischemic heart disease   . Mitral regurgitation   . HTN (hypertension)   . Non Hodgkin's lymphoma ([place])     of the throat  . Immune thrombocytopenic purpura ([place]) 01/21/2013  . Coronary artery disease     sees Dr Claiborne Billings every 6 months  . DM (diabetes mellitus) ([place])     type 2 ; diet controlled  . Thrombocytopenia ([place])     sees Dr Marin Olp every 2 weeks ;receives Nplate prn  . Subdural hematoma, post-traumatic ([place]) 2015    sees Dr Sherwood Gambler every 6 months  . Aortic stenosis   . Chronic kidney disease     Past Surgical History  Procedure Laterality Date  . Coronary artery bypass graft   11/20/2007    x5  . Bone marrow biopsy  11/22/2002    left posterior iliac crest bone marrow biopsy and aspirate  . Submental lymph node excisional biopsy  10/22/2002  . Eye surgery    . Cataract extraction w/ intraocular lens  implant, bilateral Bilateral        HPI  from the history and physical done on the day of admission:    Lisa James is a 81 y.o. female with a past medical history of hypertension, CAD, CHF, mitral regurgitation, aortic stenosis, type 2 diabetes, hyperlipidemia, ITP who was brought to the emergency department by her niece due to increased confusion since last Friday. Her niece stated to the emergency department, that a neighbor was concerned and called her about her aunt. She is states that recently she has been finding the patient's pills lying down on the floor, and apparently she has not taken them in several days. The patient is afebrile, denies pain, and does not seem to be in no acute distress.     Hospital Course:     1. Mild delirium in a patient with moderate  to advanced dementia. CT head and MRI brain nonacute with chronic subdural hematoma and small vessel disease, no signs of infection, UA chest x-ray stable. Patient has no focal signs. Is presently demented. It is likely having gradually progressive dementia with bouts of delirium. Currently appears comfortable and at baseline. So far living in an assisted living type setting. Niece updated yesterday over the phone.  Patient underwent PT evaluation and did fine, unfortunately she did not qualify for SNF placement. At this point it'll be up to family to provide her close supervision. I have ordered home PT, health aide and social worker upon discharge. Request PCP to closely monitor for outpatient qualification for SNF placement. Social work and case management have been consulted.   2. Dyslipidemia. On statin continue.   3. History of CAD. Continue on Coreg, not on aspirin due to subdural hematoma  history.   4. Essential hypertension. On Coreg, ARB and Aldactone. Continue.   5. Chronic subdural hematoma. Stable   6. Depression on Zoloft continue.     Discharge Condition: Fair  Follow UP  Follow-up Information    Follow up with DAUB, STEVE A, MD. Schedule an appointment as soon as possible for a visit in 1 week.   Specialty:  Family Medicine   Contact information:   [place] [address] [phone]        Consults obtained -  None  Diet and Activity recommendation: See Discharge Instructions below  Discharge Instructions       Discharge Instructions    Discharge instructions    Complete by:  As directed   Follow with Primary MD DAUB, STEVE A, MD in 7 days   Get CBC, CMP, 2 view Chest X ray checked  by Primary MD next visit.    Activity: As tolerated with Full fall precautions use walker/cane & assistance as needed   Disposition Home    Diet: Heart Healthy with feeding assistance and aspiration precautions.  For Heart failure patients - Check your Weight same time everyday, if you gain over 2 pounds, or you develop in leg swelling, experience more shortness of breath or chest pain, call your Primary MD immediately. Follow Cardiac Low Salt Diet and 1.5 lit/day fluid restriction.   On your next visit with your primary care physician please Get Medicines reviewed and adjusted.   Please request your Prim.MD to go over all Hospital Tests and Procedure/Radiological results at the follow up, please get all Hospital records sent to your Prim MD by signing hospital release before you go home.   If you experience worsening of your admission symptoms, develop shortness of breath, life threatening emergency, suicidal or homicidal thoughts you must seek medical attention immediately by calling 911 or calling your MD immediately  if symptoms less severe.  You Must read complete instructions/literature along with all the possible adverse  reactions/side effects for all the Medicines you take and that have been prescribed to you. Take any new Medicines after you have completely understood and accpet all the possible adverse reactions/side effects.   Do not drive, operating heavy machinery, perform activities at heights, swimming or participation in water activities or provide baby sitting services if your were admitted for syncope or siezures until you have seen by Primary MD or a Neurologist and advised to do so again.  Do not drive when taking Pain medications.    Do not take more than prescribed Pain, Sleep and Anxiety Medications  Special Instructions: If you have smoked or  chewed Tobacco  in the last 2 yrs please stop smoking, stop any regular Alcohol  and or any Recreational drug use.  Wear Seat belts while driving.   Please note  You were cared for by a hospitalist during your hospital stay. If you have any questions about your discharge medications or the care you received while you were in the hospital after you are discharged, you can call the unit and asked to speak with the hospitalist on call if the hospitalist that took care of you is not available. Once you are discharged, your primary care physician will handle any further medical issues. Please note that NO REFILLS for any discharge medications will be authorized once you are discharged, as it is imperative that you return to your primary care physician (or establish a relationship with a primary care physician if you do not have one) for your aftercare needs so that they can reassess your need for medications and monitor your lab values.     Increase activity slowly    Complete by:  As directed              Discharge Medications       Medication List    TAKE these medications        atorvastatin 20 MG tablet  Commonly known as:  LIPITOR  TAKE 1 TABLET BY MOUTH EVERY DAY     calcium-vitamin D 500-200 MG-UNIT tablet  Take 1 tablet by mouth daily.       carvedilol 6.25 MG tablet  Commonly known as:  COREG  TAKE 1 TABLET BY MOUTH TWICE A DAY WITH A MEAL     furosemide 20 MG tablet  Commonly known as:  LASIX  Take 1 tablet (20 mg total) by mouth daily.     irbesartan 300 MG tablet  Commonly known as:  AVAPRO  Take 1 tablet (300 mg total) by mouth daily.     potassium chloride SA 20 MEQ tablet  Commonly known as:  K-DUR,KLOR-CON  Take 20 mEq by mouth every other day.     PRESERVISION AREDS PO  Take 1 tablet by mouth daily.     romiPLOStim 250 MCG injection  Commonly known as:  NPLATE  Inject 1 mcg/kg into the skin as needed (every 2-3 weeks for low platelets). Given @ Oncology     sertraline 25 MG tablet  Commonly known as:  ZOLOFT  TAKE 1 TABLET BY MOUTH EVERY DAY     spironolactone 25 MG tablet  Commonly known as:  ALDACTONE  Take 25 mg by mouth daily.     Vitamin D 1000 UNITS capsule  Take 1,000 Units by mouth at bedtime.        Major procedures and Radiology Reports - PLEASE review detailed and final reports for all details, in brief -    Ct Head Wo Contrast  05/12/2015  CLINICAL DATA:  Sudden onset of altered mental status. Stroke symptoms. EXAM: CT HEAD WITHOUT CONTRAST TECHNIQUE: Contiguous axial images were obtained from the base of the skull through the vertex without intravenous contrast. COMPARISON:  Multiple prior head CTs most recently 01/31/2015. Interval exams dating back to 2015 also reviewed. FINDINGS: Chronic large complex left subdural collection is unchanged in appearance compared to prior exam and consistent with calcified subdural hematoma. This is overall unchanged in size and appearance compared to prior exam. The maximal thickness is 2.9 cm. Midline shift of 5 mm is unchanged. Small anterior left frontal subdural collection is also  unchanged in size and appearance. Isodense right subdural collection consistent with hygroma, also stable in appearance without acute component. Generalized atrophy is  stable from prior. No hydrocephalus. Chronic small vessel ischemic change, also stable. No CT findings of acute territorial infarct or ischemia. The calvarium is intact. Included paranasal sinuses are well aerated. There is minimal opacification of lower left mastoid air cells. Right mastoid air cells are well aerated. IMPRESSION: 1. Stable chronic large calcifying subdural hematoma, maximal thickness 2.9 cm. Left-to-right midline shift of 5 mm is unchanged. 2. Stable small right subdural hygroma. 3. No acute intracranial abnormality is seen. Electronically Signed   By: Rubye Oaks M.D.   On: 05/12/2015 20:23   Mr Brain Wo Contrast  05/12/2015  CLINICAL DATA:  Initial evaluation for acute altered mental status, concern for ischemia. EXAM: MRI HEAD WITHOUT CONTRAST TECHNIQUE: Multiplanar, multiecho pulse sequences of the brain and surrounding structures were obtained without intravenous contrast. COMPARISON:  Prior CT from earlier the same day. FINDINGS: Diffuse prominence of the CSF containing spaces is compatible with generalized age-related cerebral atrophy. Patchy and confluent T2/FLAIR hyperintensity within the periventricular and deep white matter most consistent with chronic small vessel ischemic disease, mild for patient age. Large complex partially calcified chronic subdural hematoma overlying the left frontal convexity again seen. This measures up to 3.0 cm at the level of the low left frontal lobe, and 2.7 cm at the level of the left parietal lobe. This is similar relative to previous studies. There is mild mass effect on the subjacent left cerebral hemisphere with associated left-to-right midline shift of 6 mm at the level of the septum pellucidum. This is also stable. Smaller additional chronic left subdural hematoma overlying the anterior left frontal lobe is also similar measuring up to 9 mm. Probable small thin chronic subdural hygroma overlies the right cerebral convexity measuring up to 4  mm in maximal thickness, best appreciated on coronal T2 weighted sequence. No abnormal foci of restricted diffusion to suggest acute intracranial infarct. Normal intravascular flow voids are maintained. Gray-white matter differentiation preserved. No acute intracranial hemorrhage. No mass lesion identified.  No hydrocephalus. Craniocervical junction widely patent. Scattered degenerative spondylolysis present within the visualized upper cervical spine. Incidental note made of a partially empty sella. No acute abnormality about the orbits. Sequela prior bilateral lens extraction noted. Mild mucosal thickening within ethmoidal air cells. Small bilateral mastoid effusions, slightly larger on the left. Inner ear structures normal. Bone marrow signal intensity within normal limits. Scalp soft tissues unremarkable. IMPRESSION: 1. No acute intracranial infarct or other process identified. 2. Large complex calcifying chronic left subdural hematoma measuring up to 3 cm in maximal thickness with associated midline shift of 6 mm, similar to previous studies. Smaller anterior left frontal subdural collection also unchanged. 3. Small chronic subdural hygroma overlying the right cerebral convexity measuring up to 4 mm without mass effect. 4. Atrophy with chronic microvascular ischemic disease. Electronically Signed   By: Rise Mu M.D.   On: 05/12/2015 23:16   Dg Chest Portable 1 View  05/12/2015  CLINICAL DATA:  Altered mental status, low-grade fever EXAM: PORTABLE CHEST 1 VIEW COMPARISON:  05/11/2011 FINDINGS: Lungs are clear.  No pleural effusion or pneumothorax. Mild cardiomegaly. Postsurgical changes related to prior CABG. Median sternotomy. IMPRESSION: No evidence of acute cardiopulmonary disease. Electronically Signed   By: Lisa Bills M.D.   On: 05/12/2015 21:12    Micro Results   Recent Results (from the past 240 hour(s))  Urine culture  Status: None   Collection Time: 05/12/15  6:40 PM    Result Value Ref Range Status   Colony Count NO GROWTH  Final   Organism ID, Bacteria NO GROWTH  Final    Today   Subjective    Lisa James today has no headache,no chest abdominal pain,no new weakness tingling or numbness, feels much better wants to go home today.     Objective   Blood pressure 121/54, pulse 58, temperature 98.2 F (36.8 C), temperature source Oral, resp. rate 17, height 4' 10.5" (1.486 m), weight 52.254 kg (115 lb 3.2 oz), SpO2 98 %.   Intake/Output Summary (Last 24 hours) at 05/15/15 1029 Last data filed at 05/15/15 0800  Gross per 24 hour  Intake    700 ml  Output   1900 ml  Net  -1200 ml    Exam Awake Alert, Oriented x 1, No new F.N deficits, Normal affect [place].AT,PERRAL Supple Neck,No JVD, No cervical lymphadenopathy appriciated.  Symmetrical Chest wall movement, Good air movement bilaterally, CTAB RRR,No Gallops,Rubs or new Murmurs, No Parasternal Heave +ve B.Sounds, Abd Soft, Non tender, No organomegaly appriciated, No rebound -guarding or rigidity. No Cyanosis, Clubbing or edema, No new Rash or bruise.    Data Review   CBC w Diff: Lab Results  Component Value Date   WBC 7.3 05/13/2015   WBC 7.2 05/12/2015   WBC 7.3 05/02/2015   WBC 6.4 12/09/2013   WBC 5.9 11/19/2012   HGB 13.8 05/13/2015   HGB 13.6 05/12/2015   HGB 13.7 05/02/2015   HGB 12.9 12/09/2013   HGB 11.4* 11/19/2012   HCT 39.6 05/13/2015   HCT 40.3 05/12/2015   HCT 39.9 05/02/2015   HCT 37.5 12/09/2013   HCT 32.7* 11/19/2012   PLT 141* 05/13/2015   PLT 53* 05/02/2015   PLT 54* 12/09/2013   PLT 64* 11/19/2012   LYMPHOPCT 20 05/12/2015   LYMPHOPCT 17.3 05/02/2015   LYMPHOPCT 18.1 12/09/2013   MONOPCT 7 05/12/2015   MONOPCT 8.0 05/02/2015   MONOPCT 8.1 12/09/2013   EOSPCT 12 05/12/2015   EOSPCT 13.5* 05/02/2015   EOSPCT 5.1 12/09/2013   BASOPCT 0 05/12/2015   BASOPCT 0.5 05/02/2015   BASOPCT 0.2 12/09/2013    CMP: Lab Results  Component Value Date   NA 135  05/13/2015   NA 142 01/21/2013   NA 142 11/19/2012   K 3.8 05/13/2015   K 5.3* 01/21/2013   K 4.3 11/19/2012   CL 102 05/13/2015   CL 103 01/21/2013   CL 109* 11/19/2012   CO2 26 05/13/2015   CO2 27 01/21/2013   CO2 24 11/19/2012   BUN 11 05/13/2015   BUN 32* 01/21/2013   BUN 32* 11/19/2012   CREATININE 1.11* 05/13/2015   CREATININE 1.34* 09/08/2014   CREATININE 1.76* 11/19/2012   PROT 5.7* 05/13/2015   PROT 6.4 01/21/2013   PROT 6.9 11/19/2012   ALBUMIN 3.6 05/13/2015   ALBUMIN 3.9 01/21/2013   ALBUMIN 4.2 11/19/2012   BILITOT 1.3* 05/13/2015   BILITOT 1.10 01/21/2013   BILITOT 1.1* 11/19/2012   ALKPHOS 76 05/13/2015   ALKPHOS 67 01/21/2013   ALKPHOS 69 11/19/2012   AST 21 05/13/2015   AST 24 01/21/2013   AST 99* 11/19/2012   ALT 12* 05/13/2015   ALT 18 01/21/2013   ALT 52 11/19/2012    Total Time in preparing paper work, data evaluation and todays exam - 35 minutes  Lala Lund K M.D on 05/15/2015 at 10:29 AM  [facility]  Office  [phone]

## 2015-05-27 ENCOUNTER — Ambulatory Visit (INDEPENDENT_AMBULATORY_CARE_PROVIDER_SITE_OTHER): Payer: Medicare Other | Admitting: Family Medicine

## 2015-05-27 VITALS — BP 102/60 | HR 60 | Temp 97.8°F | Resp 16 | Ht 58.5 in | Wt 115.0 lb

## 2015-05-27 DIAGNOSIS — N189 Chronic kidney disease, unspecified: Secondary | ICD-10-CM

## 2015-05-27 DIAGNOSIS — D693 Immune thrombocytopenic purpura: Secondary | ICD-10-CM

## 2015-05-27 DIAGNOSIS — F0390 Unspecified dementia without behavioral disturbance: Secondary | ICD-10-CM | POA: Diagnosis not present

## 2015-05-27 LAB — CBC
HCT: 38.8 % (ref 36.0–46.0)
Hemoglobin: 13 g/dL (ref 12.0–15.0)
MCH: 32.7 pg (ref 26.0–34.0)
MCHC: 33.5 g/dL (ref 30.0–36.0)
MCV: 97.5 fL (ref 78.0–100.0)
MPV: 12.3 fL (ref 8.6–12.4)
PLATELETS: 68 10*3/uL — AB (ref 150–400)
RBC: 3.98 MIL/uL (ref 3.87–5.11)
RDW: 12.8 % (ref 11.5–15.5)
WBC: 6.8 10*3/uL (ref 4.0–10.5)

## 2015-05-27 LAB — BASIC METABOLIC PANEL
BUN: 25 mg/dL (ref 7–25)
CALCIUM: 9.1 mg/dL (ref 8.6–10.4)
CO2: 25 mmol/L (ref 20–31)
CREATININE: 1.41 mg/dL — AB (ref 0.60–0.88)
Chloride: 97 mmol/L — ABNORMAL LOW (ref 98–110)
Glucose, Bld: 189 mg/dL — ABNORMAL HIGH (ref 65–99)
Potassium: 4 mmol/L (ref 3.5–5.3)
Sodium: 131 mmol/L — ABNORMAL LOW (ref 135–146)

## 2015-05-27 NOTE — Patient Instructions (Signed)
Continue to maintain adequate fluids.  You should receive a call or letter about your lab results within the next week to 10 days.  Plan on follow up with Dr. Everlene Farrier in 1 month.  Return to the clinic or go to the nearest emergency room if any of your symptoms worsen or new symptoms occur.

## 2015-05-27 NOTE — Progress Notes (Signed)
Subjective:  This chart was scribed for Lisa Ray, MD by Lisa James, ED Scribe. This patient was seen in room 1 and the patient's care was started at 9:05 AM.   Patient ID: Lisa James, female    DOB: 10/18/1927, 81 y.o.   MRN: [id number]  HPI   Chief Complaint  Patient presents with  . Follow-up    post hospital stay   HPI Comments: Lisa James is a 81 y.o. female who presents to the [facility] for a hospital follow up. She was seen by me 10/21 with change in mental status. She has base line dementia but was here with her niece who noted her to be more confused. She has a complicated medical hx including DM, PAD, CAD, CKD and hx of intercranial bleed. Sent to [facility] for evaluation. Hospitalized 10/21-10/24 for hypertensive encephalopathy and altered mental status. She had CT head and MRI brain showing chronic subdermal hematoma and James vessel disease. No signs of infection in UA, CXR was stable. Thought to have gradual progressive dementia with thought of delirium Reccommended SNF placement but did not qualify at that time. Discharge with home PT, health aid and social worker. PCP DAUB, STEVE A, MD. Reccommended f/u issues from hospitalization. Repeat CBC and BMP. As well as discussion of SNF placement.   Pt has a new living situation. She now lives at [facility] living. There is a CNA that comes by twice a day for medication management, which has been going well. Also receives meals. She is able to bathe herself. She has not been set up with PT. States patient gets outside and walks and that she is very mobile. No fever, cough or new symptoms.    Patient Active Problem List   Diagnosis Date Noted  . Altered mental status 05/13/2015  . ITP (idiopathic thrombocytopenic purpura) 07/06/2013  . Aortic valve stenosis 06/19/2013  . Immune thrombocytopenic purpura ([place]) 01/21/2013  . GERD (gastroesophageal reflux disease) 12/17/2012  . Type II or  unspecified type diabetes mellitus with renal manifestations, not stated as uncontrolled 12/10/2012  . Chronic kidney disease (CKD), stage III (moderate) 11/22/2012  . Diabetes mellitus ([place]) 07/03/2011  . Depression 07/03/2011  . Non-Hodgkin's lymphoma ([place]) 07/03/2011  . CAD (coronary artery disease) 07/03/2011  . PAD (peripheral artery disease) ([place]) 07/03/2011  . Vitamin D deficiency 07/03/2011  . Subdural hematoma ([place]) 07/03/2011  . Confusion 05/12/2011  . Subdural hematoma, acute ([place]) 05/12/2011  . HYPERLIPIDEMIA TYPE IIB / III 05/10/2009  . OLD MYOCARDIAL INFARCTION 05/10/2009  . Occlusion and stenosis of carotid artery without mention of cerebral infarction 05/10/2009  . HYPERTENSION, BENIGN 11/03/2008  . MITRAL REGURGITATION 10/29/2008  . ISCHEMIC HEART DISEASE 10/29/2008  . SYSTOLIC HEART FAILURE, CHRONIC 10/29/2008   Past Medical History  Diagnosis Date  . Acute MI, lateral wall ([place])   . Chronic systolic heart failure ([place])   . Ischemic heart disease   . Mitral regurgitation   . HTN (hypertension)   . Non Hodgkin's lymphoma ([place])     of the throat  . Immune thrombocytopenic purpura ([place]) 01/21/2013  . Coronary artery disease     sees Dr Claiborne Billings every 6 months  . DM (diabetes mellitus) ([place])     type 2 ; diet controlled  . Thrombocytopenia ([place])     sees Dr Marin Olp every 2 weeks ;receives Nplate prn  . Subdural hematoma, post-traumatic ([place]) 2015    sees Dr Sherwood Gambler every 6 months  .  Aortic stenosis   . Chronic kidney disease    Past Surgical History  Procedure Laterality Date  . Coronary artery bypass graft  11/20/2007    x5  . Bone marrow biopsy  11/22/2002    left posterior iliac crest bone marrow biopsy and aspirate  . Submental lymph node excisional biopsy  10/22/2002  . Eye surgery    . Cataract extraction w/ intraocular lens  implant, bilateral Bilateral    Allergies  Allergen Reactions  . Ace Inhibitors Cough   Prior to Admission medications     Medication Sig Start Date End Date Taking? Authorizing Provider  atorvastatin (LIPITOR) 20 MG tablet TAKE 1 TABLET BY MOUTH EVERY DAY 05/04/15  Yes Chelle Jeffery, PA-C  Calcium Carbonate-Vitamin D (CALCIUM-VITAMIN D) 500-200 MG-UNIT per tablet Take 1 tablet by mouth daily.    Yes Historical Provider, MD  carvedilol (COREG) 6.25 MG tablet TAKE 1 TABLET BY MOUTH TWICE A DAY WITH A MEAL 05/04/15  Yes Chelle Jeffery, PA-C  Cholecalciferol (VITAMIN D) 1000 UNITS capsule Take 1,000 Units by mouth at bedtime.    Yes Historical Provider, MD  furosemide (LASIX) 20 MG tablet Take 1 tablet (20 mg total) by mouth daily. 03/13/15  Yes Darlyne Russian, MD  irbesartan (AVAPRO) 300 MG tablet Take 1 tablet (300 mg total) by mouth daily. 03/13/15  Yes Troy Sine, MD  potassium chloride SA (K-DUR,KLOR-CON) 20 MEQ tablet Take 20 mEq by mouth every other day.   Yes Historical Provider, MD  romiPLOStim (NPLATE) 250 MCG injection Inject 1 mcg/kg into the skin as needed (every 2-3 weeks for low platelets). Given @ Oncology   Yes Historical Provider, MD  sertraline (ZOLOFT) 25 MG tablet TAKE 1 TABLET BY MOUTH EVERY DAY 05/04/15  Yes Darlyne Russian, MD  spironolactone (ALDACTONE) 25 MG tablet Take 25 mg by mouth daily.   Yes Historical Provider, MD  Multiple Vitamins-Minerals (PRESERVISION AREDS PO) Take 1 tablet by mouth daily.    Historical Provider, MD   Social History   Social History  . Marital Status: Widowed    Spouse Name: N/A  . Number of Children: N/A  . Years of Education: N/A   Occupational History  . Not on file.   Social History Main Topics  . Smoking status: Never Smoker   . Smokeless tobacco: Never Used     Comment: never used tobacco.  . Alcohol Use: No  . Drug Use: No  . Sexual Activity: No   Other Topics Concern  . Not on file   Social History Narrative   Review of Systems  Constitutional: Negative for fever and chills.  Respiratory: Negative for cough.        Objective:    Physical Exam  Constitutional: She is oriented to person, place, and time. She appears well-developed and well-nourished. No distress.  HENT:  Head: Normocephalic and atraumatic.  Mouth/Throat: Oropharynx is clear and moist.  Eyes: Conjunctivae and EOM are normal.  Neck: Neck supple.  Cardiovascular: Normal rate and regular rhythm.   Murmur heard.  Systolic murmur is present with a grade of 3/6  Pulmonary/Chest: Effort normal and breath sounds normal. No respiratory distress. She has no wheezes. She has no rales.  Abdominal: Soft. There is no tenderness.  Musculoskeletal: Normal range of motion.  Neurological: She is alert and oriented to person, place, and time.  Skin: Skin is warm and dry.  Psychiatric: She has a normal mood and affect. Her behavior is normal.  Nursing note  and vitals reviewed.   Filed Vitals:   05/27/15 0833  BP: 102/60  Pulse: 60  Temp: 97.8 F (36.6 C)  TempSrc: Oral  Resp: 16  Height: 4' 10.5" (1.486 m)  Weight: 115 lb (52.164 kg)  SpO2: 98%   Assessment & Plan:  Lisa James is a 81 y.o. female  Dementia, without behavioral disturbance  -Status post hospitalization for dementia with superimposed delirium. Suspected progressive dementia with episodic delirium. No acute infection or acute findings noted during hospitalization. Discussion was made of SNF, but with home health aide coming out to her facility twice per day and now managing medications, appears to be stable at this time. Will have her follow-up in one month with Dr. Everlene Farrier her PCP to make sure this plan is still adequate. RTC/ER precautions if any worsening symptoms prior.   CKD (chronic kidney disease), unspecified stage - Plan: Basic metabolic panel  ITP (idiopathic thrombocytopenic purpura) - Plan: CBC     No orders of the defined types were placed in this encounter.   Patient Instructions  Continue to maintain adequate fluids.  You should receive a call or letter about your lab  results within the next week to 10 days.  Plan on follow up with Dr. Everlene Farrier in 1 month.  Return to the clinic or go to the nearest emergency room if any of your symptoms worsen or new symptoms occur.       By signing my name below, I, Lisa James, attest that this documentation has been prepared under the direction and in the presence of Lisa Ray, MD.  Electronically Signed: Thea James, ED Scribe. 05/27/2015. 9:35 AM.

## 2015-05-31 ENCOUNTER — Other Ambulatory Visit: Payer: Self-pay | Admitting: Physician Assistant

## 2015-06-01 ENCOUNTER — Ambulatory Visit: Payer: Medicare Other

## 2015-06-01 ENCOUNTER — Other Ambulatory Visit: Payer: Medicare Other

## 2015-06-01 ENCOUNTER — Ambulatory Visit: Payer: Medicare Other | Admitting: Hematology & Oncology

## 2015-06-05 ENCOUNTER — Other Ambulatory Visit: Payer: Self-pay | Admitting: Emergency Medicine

## 2015-06-16 ENCOUNTER — Ambulatory Visit: Payer: Medicare Other | Admitting: Emergency Medicine

## 2015-06-27 ENCOUNTER — Encounter: Payer: Self-pay | Admitting: Hematology & Oncology

## 2015-06-27 ENCOUNTER — Ambulatory Visit (HOSPITAL_BASED_OUTPATIENT_CLINIC_OR_DEPARTMENT_OTHER): Payer: Medicare Other | Admitting: Hematology & Oncology

## 2015-06-27 ENCOUNTER — Ambulatory Visit (HOSPITAL_BASED_OUTPATIENT_CLINIC_OR_DEPARTMENT_OTHER): Payer: Medicare Other

## 2015-06-27 ENCOUNTER — Other Ambulatory Visit (HOSPITAL_BASED_OUTPATIENT_CLINIC_OR_DEPARTMENT_OTHER): Payer: Medicare Other

## 2015-06-27 VITALS — BP 146/52 | HR 64 | Temp 97.4°F | Resp 16 | Ht <= 58 in | Wt 114.0 lb

## 2015-06-27 DIAGNOSIS — D693 Immune thrombocytopenic purpura: Secondary | ICD-10-CM

## 2015-06-27 DIAGNOSIS — D696 Thrombocytopenia, unspecified: Secondary | ICD-10-CM

## 2015-06-27 LAB — CBC WITH DIFFERENTIAL (CANCER CENTER ONLY)
BASO#: 0 10*3/uL (ref 0.0–0.2)
BASO%: 0.5 % (ref 0.0–2.0)
EOS%: 7.8 % — AB (ref 0.0–7.0)
Eosinophils Absolute: 0.5 10*3/uL (ref 0.0–0.5)
HCT: 37.8 % (ref 34.8–46.6)
HEMOGLOBIN: 13.1 g/dL (ref 11.6–15.9)
LYMPH#: 1.4 10*3/uL (ref 0.9–3.3)
LYMPH%: 21.4 % (ref 14.0–48.0)
MCH: 33.8 pg (ref 26.0–34.0)
MCHC: 34.7 g/dL (ref 32.0–36.0)
MCV: 97 fL (ref 81–101)
MONO#: 0.5 10*3/uL (ref 0.1–0.9)
MONO%: 7.8 % (ref 0.0–13.0)
NEUT%: 62.5 % (ref 39.6–80.0)
NEUTROS ABS: 4.1 10*3/uL (ref 1.5–6.5)
Platelets: 59 10*3/uL — ABNORMAL LOW (ref 145–400)
RBC: 3.88 10*6/uL (ref 3.70–5.32)
RDW: 12.4 % (ref 11.1–15.7)
WBC: 6.6 10*3/uL (ref 3.9–10.0)

## 2015-06-27 LAB — CHCC SATELLITE - SMEAR

## 2015-06-27 MED ORDER — ROMIPLOSTIM 250 MCG ~~LOC~~ SOLR
2.0000 ug/kg | Freq: Once | SUBCUTANEOUS | Status: AC
  Administered 2015-06-27: 105 ug via SUBCUTANEOUS
  Filled 2015-06-27: qty 0.21

## 2015-06-27 NOTE — Patient Instructions (Signed)
Romiplostim injection What is this medicine? ROMIPLOSTIM (roe mi PLOE stim) helps your body make more platelets. This medicine is used to treat low platelets caused by chronic idiopathic thrombocytopenic purpura (ITP). This medicine may be used for other purposes; ask your health care provider or pharmacist if you have questions. What should I tell my health care provider before I take this medicine? They need to know if you have any of these conditions: -cancer or myelodysplastic syndrome -low blood counts, like low white cell, platelet, or red cell counts -take medicines that treat or prevent blood clots -an unusual or allergic reaction to romiplostim, mannitol, other medicines, foods, dyes, or preservatives -pregnant or trying to get pregnant -breast-feeding How should I use this medicine? This medicine is for injection under the skin. It is given by a health care professional in a hospital or clinic setting. A special MedGuide will be given to you before your injection. Read this information carefully each time. Talk to your pediatrician regarding the use of this medicine in children. Special care may be needed. Overdosage: If you think you have taken too much of this medicine contact a poison control center or emergency room at once. NOTE: This medicine is only for you. Do not share this medicine with others. What if I miss a dose? It is important not to miss your dose. Call your doctor or health care professional if you are unable to keep an appointment. What may interact with this medicine? Interactions are not expected. This list may not describe all possible interactions. Give your health care provider a list of all the medicines, herbs, non-prescription drugs, or dietary supplements you use. Also tell them if you smoke, drink alcohol, or use illegal drugs. Some items may interact with your medicine. What should I watch for while using this medicine? Your condition will be monitored  carefully while you are receiving this medicine. Visit your prescriber or health care professional for regular checks on your progress and for the needed blood tests. It is important to keep all appointments. What side effects may I notice from receiving this medicine? Side effects that you should report to your doctor or health care professional as soon as possible: -allergic reactions like skin rash, itching or hives, swelling of the face, lips, or tongue -shortness of breath, chest pain, swelling in a leg -unusual bleeding or bruising Side effects that usually do not require medical attention (report to your doctor or health care professional if they continue or are bothersome): -dizziness -headache -muscle aches -pain in arms and legs -stomach pain -trouble sleeping This list may not describe all possible side effects. Call your doctor for medical advice about side effects. You may report side effects to FDA at 1-800-FDA-1088. Where should I keep my medicine? This drug is given in a hospital or clinic and will not be stored at home. NOTE: This sheet is a summary. It may not cover all possible information. If you have questions about this medicine, talk to your doctor, pharmacist, or health care provider.    2016, Elsevier/Gold Standard. (2008-03-07 15:13:04)  

## 2015-06-27 NOTE — Progress Notes (Signed)
Hematology and Oncology Follow Up Visit  IVIANNA CORNELY FI:[id number] 1927-09-12 81 y.o. 06/27/2015   Principle Diagnosis:   Thrombocytopenia-likely immune thrombocytopenia  Current Therapy:   Nplate as indicated- maintain platelet count > than 100,000     Interim History:  Ms.  Shull is back for followup. She looks well. She feels well. She is pretty active at the nursing home.  She had a good Thanksgiving. She will be at the nursing home for Christmas.  She's had no bleeding.  Her appetite has been pretty good. She's had no nausea or vomiting. She's had no leg swelling. She's had no rashes. She's had no bruising.  She's had no cough. She's had no fever. She had no obvious infections.  Overall, her performance status is ECOG 3. Medications:  Current outpatient prescriptions:  .  atorvastatin (LIPITOR) 20 MG tablet, TAKE 1 TABLET BY MOUTH EVERY DAY, Disp: 30 tablet, Rfl: 0 .  Calcium Carbonate-Vitamin D (CALCIUM-VITAMIN D) 500-200 MG-UNIT per tablet, Take 1 tablet by mouth daily. , Disp: , Rfl:  .  carvedilol (COREG) 6.25 MG tablet, TAKE 1 TABLET BY MOUTH TWICE A DAY WITH A MEAL, Disp: 60 tablet, Rfl: 0 .  Cholecalciferol (VITAMIN D) 1000 UNITS capsule, Take 1,000 Units by mouth at bedtime. , Disp: , Rfl:  .  furosemide (LASIX) 20 MG tablet, Take 1 tablet (20 mg total) by mouth daily., Disp: 90 tablet, Rfl: 0 .  irbesartan (AVAPRO) 300 MG tablet, Take 1 tablet (300 mg total) by mouth daily., Disp: 30 tablet, Rfl: 3 .  Multiple Vitamins-Minerals (PRESERVISION AREDS PO), Take 1 tablet by mouth daily., Disp: , Rfl:  .  potassium chloride SA (KLOR-CON M20) 20 MEQ tablet, TAKE 1 TABLET BY MOUTH ONCE DAILY, Disp: 30 tablet, Rfl: 0 .  romiPLOStim (NPLATE) 250 MCG injection, Inject 1 mcg/kg into the skin as needed (every 2-3 weeks for low platelets). Given @ Oncology, Disp: , Rfl:  .  sertraline (ZOLOFT) 25 MG tablet, TAKE 1 TABLET BY MOUTH EVERY DAY, Disp: 30 tablet, Rfl: 0 .   spironolactone (ALDACTONE) 25 MG tablet, Take 25 mg by mouth daily., Disp: , Rfl:   Allergies:  Allergies  Allergen Reactions  . Ace Inhibitors Cough    Past Medical History, Surgical history, Social history, and Family History were reviewed and updated.  Review of Systems: As above  Physical Exam:  height is 4\' 10"  (1.473 m) and weight is 114 lb (51.71 kg). Her oral temperature is 97.4 F (36.3 C). Her blood pressure is 146/52 and her pulse is 64. Her respiration is 16.   Totally, petite white female in no obvious distress. Head and neck exam shows no ocular or oral lesions. Pupils reacted properly. Extraocular muscles are intact. There is no adenopathy in the neck. Lungs are clear. Cardiac exam is regular and rhythm . She has a 1/6 systolic ejection murmur. Abdomen is soft. She has good bowel sounds. There is no fluid wave. There is no palpable liver or spleen tip. Back exam shows no tenderness over the spine. She has some slight kyphosis. Extremities shows no clubbing cyanosis or edema. She has age related osteoarthritic changes. She has good strength bilaterally. Neurological exam is without any deficits. Skin exam shows no rashes, ecchymoses or petechia.  Lab Results  Component Value Date   WBC 6.6 06/27/2015   HGB 13.1 06/27/2015   HCT 37.8 06/27/2015   MCV 97 06/27/2015   PLT 59* 06/27/2015     Chemistry  Component Value Date/Time   NA 131* 05/27/2015 0939   NA 142 01/21/2013 1157   NA 142 11/19/2012 1750   K 4.0 05/27/2015 0939   K 5.3* 01/21/2013 1157   K 4.3 11/19/2012 1750   CL 97* 05/27/2015 0939   CL 103 01/21/2013 1157   CL 109* 11/19/2012 1750   CO2 25 05/27/2015 0939   CO2 27 01/21/2013 1157   CO2 24 11/19/2012 1750   BUN 25 05/27/2015 0939   BUN 32* 01/21/2013 1157   BUN 32* 11/19/2012 1750   CREATININE 1.41* 05/27/2015 0939   CREATININE 1.11* 05/13/2015 0457   CREATININE 1.76* 11/19/2012 1750      Component Value Date/Time   CALCIUM 9.1  05/27/2015 0939   CALCIUM 9.8 01/21/2013 1157   CALCIUM 10.6* 11/19/2012 1750   ALKPHOS 76 05/13/2015 0457   ALKPHOS 67 01/21/2013 1157   ALKPHOS 69 11/19/2012 1750   AST 21 05/13/2015 0457   AST 24 01/21/2013 1157   AST 99* 11/19/2012 1750   ALT 12* 05/13/2015 0457   ALT 18 01/21/2013 1157   ALT 52 11/19/2012 1750   BILITOT 1.3* 05/13/2015 0457   BILITOT 1.10 01/21/2013 1157   BILITOT 1.1* 11/19/2012 1750         Impression and Plan: Ms. Hager is a 81 year old female with thrombocytopenia. This is likely immune thrombocytopenia. I actually did do a bone marrow on her a couple of years ago..  She responds well to Nplate. We will go ahead and give her Nplate today.  I want to get her back in 4 weeks. We will see what her platelet count is at that point.     Volanda Napoleon, MD 12/6/20169:13 AM

## 2015-06-28 ENCOUNTER — Other Ambulatory Visit: Payer: Self-pay | Admitting: Emergency Medicine

## 2015-07-04 ENCOUNTER — Encounter: Payer: Self-pay | Admitting: Emergency Medicine

## 2015-07-04 ENCOUNTER — Ambulatory Visit (INDEPENDENT_AMBULATORY_CARE_PROVIDER_SITE_OTHER): Payer: Medicare Other | Admitting: Emergency Medicine

## 2015-07-04 VITALS — BP 102/65 | HR 64 | Temp 97.3°F | Resp 16 | Ht <= 58 in | Wt 116.0 lb

## 2015-07-04 DIAGNOSIS — D693 Immune thrombocytopenic purpura: Secondary | ICD-10-CM | POA: Diagnosis not present

## 2015-07-04 DIAGNOSIS — I6200 Nontraumatic subdural hemorrhage, unspecified: Secondary | ICD-10-CM

## 2015-07-04 DIAGNOSIS — S065X9A Traumatic subdural hemorrhage with loss of consciousness of unspecified duration, initial encounter: Secondary | ICD-10-CM

## 2015-07-04 DIAGNOSIS — F0390 Unspecified dementia without behavioral disturbance: Secondary | ICD-10-CM

## 2015-07-04 DIAGNOSIS — Z23 Encounter for immunization: Secondary | ICD-10-CM | POA: Diagnosis not present

## 2015-07-04 DIAGNOSIS — S065XAA Traumatic subdural hemorrhage with loss of consciousness status unknown, initial encounter: Secondary | ICD-10-CM

## 2015-07-04 DIAGNOSIS — E1122 Type 2 diabetes mellitus with diabetic chronic kidney disease: Secondary | ICD-10-CM | POA: Diagnosis not present

## 2015-07-04 DIAGNOSIS — I25810 Atherosclerosis of coronary artery bypass graft(s) without angina pectoris: Secondary | ICD-10-CM | POA: Diagnosis not present

## 2015-07-04 DIAGNOSIS — N189 Chronic kidney disease, unspecified: Secondary | ICD-10-CM

## 2015-07-04 DIAGNOSIS — E871 Hypo-osmolality and hyponatremia: Secondary | ICD-10-CM

## 2015-07-04 LAB — CBC WITH DIFFERENTIAL/PLATELET
BASOS ABS: 0 10*3/uL (ref 0.0–0.1)
BASOS PCT: 0 % (ref 0–1)
EOS ABS: 0.4 10*3/uL (ref 0.0–0.7)
EOS PCT: 7 % — AB (ref 0–5)
HCT: 38.2 % (ref 36.0–46.0)
Hemoglobin: 12.9 g/dL (ref 12.0–15.0)
Lymphocytes Relative: 17 % (ref 12–46)
Lymphs Abs: 1 10*3/uL (ref 0.7–4.0)
MCH: 33.5 pg (ref 26.0–34.0)
MCHC: 33.8 g/dL (ref 30.0–36.0)
MCV: 99.2 fL (ref 78.0–100.0)
MPV: 11.2 fL (ref 8.6–12.4)
Monocytes Absolute: 0.4 10*3/uL (ref 0.1–1.0)
Monocytes Relative: 7 % (ref 3–12)
NEUTROS PCT: 69 % (ref 43–77)
Neutro Abs: 4 10*3/uL (ref 1.7–7.7)
PLATELETS: 101 10*3/uL — AB (ref 150–400)
RBC: 3.85 MIL/uL — AB (ref 3.87–5.11)
RDW: 13.3 % (ref 11.5–15.5)
WBC: 5.8 10*3/uL (ref 4.0–10.5)

## 2015-07-04 LAB — BASIC METABOLIC PANEL WITH GFR
BUN: 35 mg/dL — ABNORMAL HIGH (ref 7–25)
CHLORIDE: 105 mmol/L (ref 98–110)
CO2: 23 mmol/L (ref 20–31)
Calcium: 9.2 mg/dL (ref 8.6–10.4)
Creat: 1.58 mg/dL — ABNORMAL HIGH (ref 0.60–0.88)
GFR, EST NON AFRICAN AMERICAN: 30 mL/min — AB (ref >=60)
GFR, Est African American: 34 mL/min — ABNORMAL LOW (ref >=60)
Glucose, Bld: 142 mg/dL — ABNORMAL HIGH (ref 65–99)
POTASSIUM: 4.5 mmol/L (ref 3.5–5.3)
SODIUM: 138 mmol/L (ref 135–146)

## 2015-07-04 NOTE — Progress Notes (Addendum)
Patient ID: Lisa James, female   DOB: 29-Nov-1927, 81 y.o.   MRN: [id number]    By signing my name below, I, Essence Howell, attest that this documentation has been prepared under the direction and in the presence of Darlyne Russian, MD Electronically Signed: Ladene Artist, ED Scribe 07/04/2015 at 12:06 PM.  Chief Complaint:  Chief Complaint  Patient presents with  . Follow-up    Check up   HPI: Lisa James is a 81 y.o. female, with a h/o subdural hematoma and dementia, who reports to [facility] today for a follow-up. Pt states that she is feeling well overall. She states that she likes where she stays and feels safe where she is. Pt denies falls. No complaints reported at this visit.   Oncology  Pt states that she has followed up with Dr. Marin Olp.   Immunizations  Pt does not recall if she has had a flu vaccine this year. She has had both pneumonia vaccines; pneumovax in 03/2010 and prevnar in 03/2014.   Past Medical History  Diagnosis Date  . Acute MI, lateral wall ([place])   . Chronic systolic heart failure ([place])   . Ischemic heart disease   . Mitral regurgitation   . HTN (hypertension)   . Non Hodgkin's lymphoma ([place])     of the throat  . Immune thrombocytopenic purpura ([place]) 01/21/2013  . Coronary artery disease     sees Dr Claiborne Billings every 6 months  . DM (diabetes mellitus) ([place])     type 2 ; diet controlled  . Thrombocytopenia ([place])     sees Dr Marin Olp every 2 weeks ;receives Nplate prn  . Subdural hematoma, post-traumatic ([place]) 2015    sees Dr Sherwood Gambler every 6 months  . Aortic stenosis   . Chronic kidney disease    Past Surgical History  Procedure Laterality Date  . Coronary artery bypass graft  11/20/2007    x5  . Bone marrow biopsy  11/22/2002    left posterior iliac crest bone marrow biopsy and aspirate  . Submental lymph node excisional biopsy  10/22/2002  . Eye surgery    . Cataract extraction w/ intraocular lens  implant, bilateral Bilateral    Social History    Social History  . Marital Status: Widowed    Spouse Name: N/A  . Number of Children: N/A  . Years of Education: N/A   Social History Main Topics  . Smoking status: Never Smoker   . Smokeless tobacco: Never Used     Comment: never used tobacco.  . Alcohol Use: No  . Drug Use: No  . Sexual Activity: No   Other Topics Concern  . None   Social History Narrative   Family History  Problem Relation Age of Onset  . Heart attack Father   . Diabetes Father    Allergies  Allergen Reactions  . Ace Inhibitors Cough   Prior to Admission medications   Medication Sig Start Date End Date Taking? Authorizing Provider  atorvastatin (LIPITOR) 20 MG tablet Take 1 tablet (20 mg total) by mouth daily. PATIENT NEEDS OFFICE VISIT FOR ADDITIONAL REFILLS 06/29/15   Darlyne Russian, MD  Calcium Carbonate-Vitamin D (CALCIUM-VITAMIN D) 500-200 MG-UNIT per tablet Take 1 tablet by mouth daily.     Historical Provider, MD  carvedilol (COREG) 6.25 MG tablet Take 1 tablet (6.25 mg total) by mouth 2 (two) times daily with a meal. PATIENT NEEDS OFFICE VISIT FOR ADDITIONAL REFILLS 06/29/15   Darlyne Russian, MD  Cholecalciferol (VITAMIN D) 1000 UNITS capsule Take 1,000 Units by mouth at bedtime.     Historical Provider, MD  furosemide (LASIX) 20 MG tablet Take 1 tablet (20 mg total) by mouth daily. 03/13/15   Darlyne Russian, MD  irbesartan (AVAPRO) 300 MG tablet Take 1 tablet (300 mg total) by mouth daily. 03/13/15   Troy Sine, MD  Multiple Vitamins-Minerals (PRESERVISION AREDS PO) Take 1 tablet by mouth daily.    Historical Provider, MD  potassium chloride SA (KLOR-CON M20) 20 MEQ tablet TAKE 1 TABLET BY MOUTH ONCE DAILY 06/06/15   Darlyne Russian, MD  romiPLOStim (NPLATE) 250 MCG injection Inject 1 mcg/kg into the skin as needed (every 2-3 weeks for low platelets). Given @ Oncology    Historical Provider, MD  sertraline (ZOLOFT) 25 MG tablet TAKE 1 TABLET BY MOUTH EVERY DAY 06/06/15   Darlyne Russian, MD   spironolactone (ALDACTONE) 25 MG tablet Take 25 mg by mouth daily.    Historical Provider, MD     ROS: The patient denies fevers, chills, night sweats, unintentional weight loss, chest pain, palpitations, wheezing, dyspnea on exertion, nausea, vomiting, abdominal pain, dysuria, hematuria, melena, numbness, weakness, or tingling.   All other systems have been reviewed and were otherwise negative with the exception of those mentioned in the HPI and as above.    PHYSICAL EXAM: Filed Vitals:   07/04/15 1136  BP: 102/65  Pulse: 64  Temp: 97.3 F (36.3 C)  Resp: 16   Body mass index is 24.25 kg/(m^2).  General: Alert, no acute distress HEENT:  Normocephalic, atraumatic, oropharynx patent. Venous lake approximately 3 cm on the upper quarter lip. Eye: Juliette Mangle [facility] Cardiovascular:  Regular rate and rhythm. Grade 2 whole systolic murmur at the L sternal border. No rubs or gallops. No Carotid bruits, radial pulse intact. No pedal edema.  Respiratory: Clear to auscultation bilaterally. No wheezes, rales, or rhonchi. No cyanosis, no use of accessory musculature Abdominal: No organomegaly, abdomen is soft and non-tender, positive bowel sounds. No masses. Musculoskeletal: Gait intact. No edema, tenderness Skin: No rashes. Neurologic: Facial musculature symmetric. Psychiatric: Patient acts appropriately throughout our interaction. Lymphatic: No cervical or submandibular lymphadenopathy  LABS: Results for orders placed or performed in visit on 06/27/15  CBC with Differential [facility])  Result Value Ref Range   WBC 6.6 3.9 - 10.0 10e3/uL   RBC 3.88 3.70 - 5.32 10e6/uL   HGB 13.1 11.6 - 15.9 g/dL   HCT 37.8 34.8 - 46.6 %   MCV 97 81 - 101 fL   MCH 33.8 26.0 - 34.0 pg   MCHC 34.7 32.0 - 36.0 g/dL   RDW 12.4 11.1 - 15.7 %   Platelets 59 (L) 145 - 400 10e3/uL   NEUT# 4.1 1.5 - 6.5 10e3/uL   LYMPH# 1.4 0.9 - 3.3 10e3/uL   MONO# 0.5 0.1 - 0.9 10e3/uL   Eosinophils Absolute 0.5 0.0 -  0.5 10e3/uL   BASO# 0.0 0.0 - 0.2 10e3/uL   NEUT% 62.5 39.6 - 80.0 %   LYMPH% 21.4 14.0 - 48.0 %   MONO% 7.8 0.0 - 13.0 %   EOS% 7.8 (H) 0.0 - 7.0 %   BASO% 0.5 0.0 - 2.0 %  [facility] - Smear  Result Value Ref Range   Smear Result Smear Available    EKG/XRAY:   Primary read interpreted by Dr. Everlene Farrier at [facility].  ASSESSMENT/PLAN: 1. Need for prophylactic vaccination and inoculation against influenza  - Flu Vaccine QUAD  36+ mos IM  2. CKD (chronic kidney disease), unspecified stage  - BASIC METABOLIC PANEL WITH GFR  3. ITP (idiopathic thrombocytopenic purpura) This is followed at the cancer center. Platelets count on the sixth was 59,000 - CBC with Differential/Platelet  4. Dementia, without behavioral disturbance This is gradually getting worse. . Type 2 diabetes mellitus with chronic kidney disease, without long-term current use of insulin, unspecified CKD stage (HCC)  - Hemoglobin A1c  6. Subdural hematoma (HCC) This has been stable. She sees Dr. Sherwood Gambler regularly they have been hesitant to perform any surgical procedures due to the risk. Not only would she have the surgical risk but she also suffers from dementia and also has significant thrombocytopenia.  7. Coronary artery disease involving coronary bypass graft of native heart without angina pectoris She has regular follow-ups with the cardiologist. She has a fairly significant murmur which is followed.  8. Hyponatremia B met. This may be related to her subdural. - Osmolality - Osmolality, urine   I personally performed the services described in this documentation, which was scribed in my presence. The recorded information has been reviewed and is accurate.I personally performed the services described in this documentation, which was scribed in my presence. The recorded information has been reviewed and is accurate. I did call her niece Anne Ng  with her next appointment time.   Arlyss Queen, MD  [facility]  07/04/2015 2:24 PM    Gross sideeffects, risk and benefits, and alternatives of medications d/w patient. Patient is aware that all medications have potential sideeffects and we are unable to predict every sideeffect or drug-drug interaction that may occur.  Arlyss Queen MD 07/04/2015 12:06 PM

## 2015-07-05 LAB — HEMOGLOBIN A1C
HEMOGLOBIN A1C: 6.6 % — AB (ref <5.7)
MEAN PLASMA GLUCOSE: 143 mg/dL — AB (ref <117)

## 2015-07-05 LAB — OSMOLALITY, URINE: OSMOLALITY UR: 353 mosm/kg — AB (ref 390–1090)

## 2015-07-05 LAB — OSMOLALITY: OSMOLALITY: 301 mosm/kg — AB (ref 275–300)

## 2015-07-10 ENCOUNTER — Other Ambulatory Visit: Payer: Self-pay | Admitting: Emergency Medicine

## 2015-07-10 ENCOUNTER — Encounter: Payer: Self-pay | Admitting: Family Medicine

## 2015-07-11 NOTE — Telephone Encounter (Signed)
Patient was seen 12/16 is it ok to fill Zoloft

## 2015-07-18 ENCOUNTER — Other Ambulatory Visit: Payer: Self-pay | Admitting: Nurse Practitioner

## 2015-07-27 ENCOUNTER — Other Ambulatory Visit: Payer: Self-pay | Admitting: Emergency Medicine

## 2015-07-28 NOTE — Telephone Encounter (Signed)
Dr Everlene Farrier, we had put notice on pt's last RFs that she needed OV for more. She did come in for a check up, but don't see HTN or chol discussed. OK to RF?

## 2015-08-08 ENCOUNTER — Telehealth: Payer: Self-pay | Admitting: Hematology & Oncology

## 2015-08-08 ENCOUNTER — Other Ambulatory Visit: Payer: Medicare Other

## 2015-08-08 ENCOUNTER — Other Ambulatory Visit: Payer: Self-pay | Admitting: Neurosurgery

## 2015-08-08 ENCOUNTER — Ambulatory Visit: Payer: Medicare Other

## 2015-08-08 ENCOUNTER — Ambulatory Visit: Payer: Medicare Other | Admitting: Family

## 2015-08-08 DIAGNOSIS — S065X9A Traumatic subdural hemorrhage with loss of consciousness of unspecified duration, initial encounter: Secondary | ICD-10-CM

## 2015-08-08 DIAGNOSIS — S065XAA Traumatic subdural hemorrhage with loss of consciousness status unknown, initial encounter: Secondary | ICD-10-CM

## 2015-08-08 NOTE — Telephone Encounter (Signed)
Care giver called and cx 08/08/15 apt due to care giver being sick.  Apt was resch for 08/15/15

## 2015-08-09 ENCOUNTER — Other Ambulatory Visit: Payer: Self-pay | Admitting: Emergency Medicine

## 2015-08-15 ENCOUNTER — Ambulatory Visit (HOSPITAL_BASED_OUTPATIENT_CLINIC_OR_DEPARTMENT_OTHER): Payer: Medicare Other | Admitting: Family

## 2015-08-15 ENCOUNTER — Encounter: Payer: Self-pay | Admitting: Family

## 2015-08-15 ENCOUNTER — Other Ambulatory Visit (HOSPITAL_BASED_OUTPATIENT_CLINIC_OR_DEPARTMENT_OTHER): Payer: Medicare Other

## 2015-08-15 ENCOUNTER — Ambulatory Visit (HOSPITAL_BASED_OUTPATIENT_CLINIC_OR_DEPARTMENT_OTHER): Payer: Medicare Other

## 2015-08-15 VITALS — BP 90/75 | HR 65 | Temp 97.5°F | Resp 16 | Ht <= 58 in | Wt 114.0 lb

## 2015-08-15 DIAGNOSIS — D693 Immune thrombocytopenic purpura: Secondary | ICD-10-CM | POA: Diagnosis not present

## 2015-08-15 LAB — CBC WITH DIFFERENTIAL (CANCER CENTER ONLY)
BASO#: 0 10*3/uL (ref 0.0–0.2)
BASO%: 0.5 % (ref 0.0–2.0)
EOS ABS: 0.4 10*3/uL (ref 0.0–0.5)
EOS%: 7.3 % — AB (ref 0.0–7.0)
HCT: 36.9 % (ref 34.8–46.6)
HEMOGLOBIN: 12.7 g/dL (ref 11.6–15.9)
LYMPH#: 1.1 10*3/uL (ref 0.9–3.3)
LYMPH%: 18.6 % (ref 14.0–48.0)
MCH: 33.9 pg (ref 26.0–34.0)
MCHC: 34.4 g/dL (ref 32.0–36.0)
MCV: 98 fL (ref 81–101)
MONO#: 0.5 10*3/uL (ref 0.1–0.9)
MONO%: 8.3 % (ref 0.0–13.0)
NEUT%: 65.3 % (ref 39.6–80.0)
NEUTROS ABS: 3.9 10*3/uL (ref 1.5–6.5)
PLATELETS: 57 10*3/uL — AB (ref 145–400)
RBC: 3.75 10*6/uL (ref 3.70–5.32)
RDW: 12.8 % (ref 11.1–15.7)
WBC: 6 10*3/uL (ref 3.9–10.0)

## 2015-08-15 LAB — CHCC SATELLITE - SMEAR

## 2015-08-15 MED ORDER — ROMIPLOSTIM 250 MCG ~~LOC~~ SOLR
2.0000 ug/kg | Freq: Once | SUBCUTANEOUS | Status: AC
  Administered 2015-08-15: 105 ug via SUBCUTANEOUS
  Filled 2015-08-15: qty 0.21

## 2015-08-15 NOTE — Progress Notes (Signed)
Hematology and Oncology Follow Up Visit  Lisa James [id number] June 06, 1928 82 y.o. 08/15/2015   Principle Diagnosis:  Thrombocytopenia-likely immune thrombocytopenia  Current Therapy:   Nplate as indicated    Interim History: Lisa James is here today with her friend from the assisted living facility for a follow-up. She is doing well and has no complaints at this time. She is her normal spunky and energetic self. She has been walk daily and has added some flights os steps into her work out.  No fatigue, fever, chills, n/v, cough, rash, headache, dizziness, SOB, chest pain, palpitations, abdominal pain or changes in her bowel or bladder habits.  No lymphadenopathy found on exam. No episodes of bleeding or bruising.  No swelling, numbness or tingling in her extremities.  She continues to keep a healthy appetite and is staying well hydrated. Her weight is stable.  Medications:    Medication List       This list is accurate as of: 08/15/15 11:18 AM.  Always use your most recent med list.               atorvastatin 20 MG tablet  Commonly known as:  LIPITOR  Take 1 tablet (20 mg total) by mouth daily.     calcium-vitamin D 500-200 MG-UNIT tablet  Take 1 tablet by mouth daily.     carvedilol 6.25 MG tablet  Commonly known as:  COREG  Take 1 tablet (6.25 mg total) by mouth 2 (two) times daily with a meal.     furosemide 20 MG tablet  Commonly known as:  LASIX  Take 1 tablet (20 mg total) by mouth daily.     irbesartan 300 MG tablet  Commonly known as:  AVAPRO  Take 1 tablet (300 mg total) by mouth daily.     potassium chloride SA 20 MEQ tablet  Commonly known as:  KLOR-CON M20  TAKE 1 TABLET BY MOUTH ONCE DAILY     PRESERVISION AREDS PO  Take 1 tablet by mouth daily.     romiPLOStim 250 MCG injection  Commonly known as:  NPLATE  Inject 1 mcg/kg into the skin as needed (every 2-3 weeks for low platelets). Given @ Oncology     sertraline 25 MG tablet  Commonly  known as:  ZOLOFT  TAKE 1 TABLET BY MOUTH EVERY DAY     spironolactone 25 MG tablet  Commonly known as:  ALDACTONE  Take 25 mg by mouth daily.     Vitamin D 1000 units capsule  Take 1,000 Units by mouth at bedtime.        Allergies:  Allergies  Allergen Reactions  . Ace Inhibitors Cough    Past Medical History, Surgical history, Social history, and Family History were reviewed and updated.  Review of Systems: All other 10 point review of systems is negative.   Physical Exam:  height is 4\' 10"  (1.473 m) and weight is 114 lb (51.71 kg). Her oral temperature is 97.5 F (36.4 C). Her blood pressure is 90/75 and her pulse is 65. Her respiration is 16.   Wt Readings from Last 3 Encounters:  08/15/15 114 lb (51.71 kg)  07/04/15 116 lb (52.617 kg)  06/27/15 114 lb (51.71 kg)    Ocular: Sclerae unicteric, pupils equal, round and reactive to light Ear-nose-throat: Oropharynx clear, dentition fair Lymphatic: No cervical supraclavicular or axillary adenopathy Lungs no rales or rhonchi, good excursion bilaterally Heart regular rate and rhythm, no murmur appreciated Abd soft, nontender, positive bowel sounds,  no liver or spleen tip palpated on exam MSK no focal spinal tenderness, no joint edema Neuro: non-focal, well-oriented, appropriate affect Breasts: Deferred  Lab Results  Component Value Date   WBC 6.0 08/15/2015   HGB 12.7 08/15/2015   HCT 36.9 08/15/2015   MCV 98 08/15/2015   PLT 57* 08/15/2015   No results found for: FERRITIN, IRON, TIBC, UIBC, IRONPCTSAT Lab Results  Component Value Date   RETICCTPCT 2.6 05/13/2011   RBC 3.75 08/15/2015   No results found for: Nils Pyle [facility] Lab Results  Component Value Date   IGGSERUM 720 09/29/2012   IGA 115 09/29/2012   IGMSERUM 73 09/29/2012   Lab Results  Component Value Date   TOTALPROTELP 6.9 09/29/2012   TOTALPROTELP 6.9 09/29/2012   ALBUMINELP 63.4 09/29/2012   A1GS 4.3 09/29/2012    A2GS 12.3* 09/29/2012   BETS 7.2 09/29/2012   BETA2SER 3.6 09/29/2012   GAMS 9.2* 09/29/2012   MSPIKE NOT DET 09/29/2012   SPEI  09/29/2012     Comment:     Nonspecific pattern associated with slightly decreased gamma globulins. Reviewed by Odis Hollingshead, MD, PhD, FCAP (Electronic Signature on File)     Chemistry      Component Value Date/Time   NA 138 07/04/2015 1211   NA 142 01/21/2013 1157   NA 142 11/19/2012 1750   K 4.5 07/04/2015 1211   K 5.3* 01/21/2013 1157   K 4.3 11/19/2012 1750   CL 105 07/04/2015 1211   CL 103 01/21/2013 1157   CL 109* 11/19/2012 1750   CO2 23 07/04/2015 1211   CO2 27 01/21/2013 1157   CO2 24 11/19/2012 1750   BUN 35* 07/04/2015 1211   BUN 32* 01/21/2013 1157   BUN 32* 11/19/2012 1750   CREATININE 1.58* 07/04/2015 1211   CREATININE 1.11* 05/13/2015 0457   CREATININE 1.76* 11/19/2012 1750      Component Value Date/Time   CALCIUM 9.2 07/04/2015 1211   CALCIUM 9.8 01/21/2013 1157   CALCIUM 10.6* 11/19/2012 1750   ALKPHOS 76 05/13/2015 0457   ALKPHOS 67 01/21/2013 1157   ALKPHOS 69 11/19/2012 1750   AST 21 05/13/2015 0457   AST 24 01/21/2013 1157   AST 99* 11/19/2012 1750   ALT 12* 05/13/2015 0457   ALT 18 01/21/2013 1157   ALT 52 11/19/2012 1750   BILITOT 1.3* 05/13/2015 0457   BILITOT 1.10 01/21/2013 1157   BILITOT 1.1* 11/19/2012 1750     Impression and Plan: Lisa James is a very pleasant 82 year old female with immune thrombocytopenia. She continues to do well and is asymptomatic at this time. No anemia.  Platelet count today is 57 so she will get a Nplate.   We will plan to see her back in 1 month for labs and follow-up. She (or her assisted living facility) will contact us with any questions or concerns. We can certainly see her sooner if need be.    Eliezer Bottom, NP 1/24/201711:18 AM

## 2015-08-15 NOTE — Patient Instructions (Signed)
Romiplostim injection What is this medicine? ROMIPLOSTIM (roe mi PLOE stim) helps your body make more platelets. This medicine is used to treat low platelets caused by chronic idiopathic thrombocytopenic purpura (ITP). This medicine may be used for other purposes; ask your health care provider or pharmacist if you have questions. What should I tell my health care provider before I take this medicine? They need to know if you have any of these conditions: -cancer or myelodysplastic syndrome -low blood counts, like low white cell, platelet, or red cell counts -take medicines that treat or prevent blood clots -an unusual or allergic reaction to romiplostim, mannitol, other medicines, foods, dyes, or preservatives -pregnant or trying to get pregnant -breast-feeding How should I use this medicine? This medicine is for injection under the skin. It is given by a health care professional in a hospital or clinic setting. A special MedGuide will be given to you before your injection. Read this information carefully each time. Talk to your pediatrician regarding the use of this medicine in children. Special care may be needed. Overdosage: If you think you have taken too much of this medicine contact a poison control center or emergency room at once. NOTE: This medicine is only for you. Do not share this medicine with others. What if I miss a dose? It is important not to miss your dose. Call your doctor or health care professional if you are unable to keep an appointment. What may interact with this medicine? Interactions are not expected. This list may not describe all possible interactions. Give your health care provider a list of all the medicines, herbs, non-prescription drugs, or dietary supplements you use. Also tell them if you smoke, drink alcohol, or use illegal drugs. Some items may interact with your medicine. What should I watch for while using this medicine? Your condition will be monitored  carefully while you are receiving this medicine. Visit your prescriber or health care professional for regular checks on your progress and for the needed blood tests. It is important to keep all appointments. What side effects may I notice from receiving this medicine? Side effects that you should report to your doctor or health care professional as soon as possible: -allergic reactions like skin rash, itching or hives, swelling of the face, lips, or tongue -shortness of breath, chest pain, swelling in a leg -unusual bleeding or bruising Side effects that usually do not require medical attention (report to your doctor or health care professional if they continue or are bothersome): -dizziness -headache -muscle aches -pain in arms and legs -stomach pain -trouble sleeping This list may not describe all possible side effects. Call your doctor for medical advice about side effects. You may report side effects to FDA at 1-800-FDA-1088. Where should I keep my medicine? This drug is given in a hospital or clinic and will not be stored at home. NOTE: This sheet is a summary. It may not cover all possible information. If you have questions about this medicine, talk to your doctor, pharmacist, or health care provider.    2016, Elsevier/Gold Standard. (2008-03-07 15:13:04)  

## 2015-08-22 ENCOUNTER — Ambulatory Visit
Admission: RE | Admit: 2015-08-22 | Discharge: 2015-08-22 | Disposition: A | Discharge status: Home / Self Care | Payer: Medicare Other | Source: Ambulatory Visit | Attending: Neurosurgery | Admitting: Neurosurgery

## 2015-08-22 DIAGNOSIS — S065XAA Traumatic subdural hemorrhage with loss of consciousness status unknown, initial encounter: Secondary | ICD-10-CM

## 2015-08-22 DIAGNOSIS — S065X9A Traumatic subdural hemorrhage with loss of consciousness of unspecified duration, initial encounter: Secondary | ICD-10-CM

## 2015-09-05 ENCOUNTER — Other Ambulatory Visit: Payer: Self-pay | Admitting: Emergency Medicine

## 2015-09-12 ENCOUNTER — Ambulatory Visit (HOSPITAL_BASED_OUTPATIENT_CLINIC_OR_DEPARTMENT_OTHER): Payer: Medicare Other

## 2015-09-12 ENCOUNTER — Encounter: Payer: Self-pay | Admitting: Hematology & Oncology

## 2015-09-12 ENCOUNTER — Other Ambulatory Visit (HOSPITAL_BASED_OUTPATIENT_CLINIC_OR_DEPARTMENT_OTHER): Payer: Medicare Other

## 2015-09-12 ENCOUNTER — Ambulatory Visit (HOSPITAL_BASED_OUTPATIENT_CLINIC_OR_DEPARTMENT_OTHER): Payer: Medicare Other | Admitting: Family

## 2015-09-12 VITALS — BP 149/58 | HR 58 | Temp 97.6°F | Resp 16 | Ht <= 58 in | Wt 115.0 lb

## 2015-09-12 DIAGNOSIS — D693 Immune thrombocytopenic purpura: Secondary | ICD-10-CM

## 2015-09-12 LAB — CBC WITH DIFFERENTIAL (CANCER CENTER ONLY)
BASO#: 0 10*3/uL (ref 0.0–0.2)
BASO%: 0.3 % (ref 0.0–2.0)
EOS%: 8 % — ABNORMAL HIGH (ref 0.0–7.0)
Eosinophils Absolute: 0.5 10*3/uL (ref 0.0–0.5)
HCT: 37.5 % (ref 34.8–46.6)
HGB: 13.2 g/dL (ref 11.6–15.9)
LYMPH#: 1.3 10*3/uL (ref 0.9–3.3)
LYMPH%: 20 % (ref 14.0–48.0)
MCH: 34.3 pg — AB (ref 26.0–34.0)
MCHC: 35.2 g/dL (ref 32.0–36.0)
MCV: 97 fL (ref 81–101)
MONO#: 0.6 10*3/uL (ref 0.1–0.9)
MONO%: 8.9 % (ref 0.0–13.0)
NEUT#: 4.1 10*3/uL (ref 1.5–6.5)
NEUT%: 62.8 % (ref 39.6–80.0)
PLATELETS: 47 10*3/uL — AB (ref 145–400)
RBC: 3.85 10*6/uL (ref 3.70–5.32)
RDW: 11.6 % (ref 11.1–15.7)
WBC: 6.5 10*3/uL (ref 3.9–10.0)

## 2015-09-12 LAB — CHCC SATELLITE - SMEAR

## 2015-09-12 MED ORDER — ROMIPLOSTIM 250 MCG ~~LOC~~ SOLR
3.0000 ug/kg | Freq: Once | SUBCUTANEOUS | Status: AC
  Administered 2015-09-12: 155 ug via SUBCUTANEOUS
  Filled 2015-09-12: qty 0.31

## 2015-09-12 NOTE — Patient Instructions (Signed)
Romiplostim injection What is this medicine? ROMIPLOSTIM (roe mi PLOE stim) helps your body make more platelets. This medicine is used to treat low platelets caused by chronic idiopathic thrombocytopenic purpura (ITP). This medicine may be used for other purposes; ask your health care provider or pharmacist if you have questions. What should I tell my health care provider before I take this medicine? They need to know if you have any of these conditions: -cancer or myelodysplastic syndrome -low blood counts, like low white cell, platelet, or red cell counts -take medicines that treat or prevent blood clots -an unusual or allergic reaction to romiplostim, mannitol, other medicines, foods, dyes, or preservatives -pregnant or trying to get pregnant -breast-feeding How should I use this medicine? This medicine is for injection under the skin. It is given by a health care professional in a hospital or clinic setting. A special MedGuide will be given to you before your injection. Read this information carefully each time. Talk to your pediatrician regarding the use of this medicine in children. Special care may be needed. Overdosage: If you think you have taken too much of this medicine contact a poison control center or emergency room at once. NOTE: This medicine is only for you. Do not share this medicine with others. What if I miss a dose? It is important not to miss your dose. Call your doctor or health care professional if you are unable to keep an appointment. What may interact with this medicine? Interactions are not expected. This list may not describe all possible interactions. Give your health care provider a list of all the medicines, herbs, non-prescription drugs, or dietary supplements you use. Also tell them if you smoke, drink alcohol, or use illegal drugs. Some items may interact with your medicine. What should I watch for while using this medicine? Your condition will be monitored  carefully while you are receiving this medicine. Visit your prescriber or health care professional for regular checks on your progress and for the needed blood tests. It is important to keep all appointments. What side effects may I notice from receiving this medicine? Side effects that you should report to your doctor or health care professional as soon as possible: -allergic reactions like skin rash, itching or hives, swelling of the face, lips, or tongue -shortness of breath, chest pain, swelling in a leg -unusual bleeding or bruising Side effects that usually do not require medical attention (report to your doctor or health care professional if they continue or are bothersome): -dizziness -headache -muscle aches -pain in arms and legs -stomach pain -trouble sleeping This list may not describe all possible side effects. Call your doctor for medical advice about side effects. You may report side effects to FDA at 1-800-FDA-1088. Where should I keep my medicine? This drug is given in a hospital or clinic and will not be stored at home. NOTE: This sheet is a summary. It may not cover all possible information. If you have questions about this medicine, talk to your doctor, pharmacist, or health care provider.    2016, Elsevier/Gold Standard. (2008-03-07 15:13:04)  

## 2015-09-12 NOTE — Progress Notes (Signed)
Hematology and Oncology Follow Up Visit  Lisa James [id number] 12/31/1927 82 y.o. 09/12/2015   Principle Diagnosis:  Thrombocytopenia-likely immune thrombocytopenia  Current Therapy:   Nplate as indicated    Interim History: Lisa James is here today with her health care worker for a follow-up. She is doing well and has no complaints at this time. She has a sore on her left ear lobe and states that she is followed by dermatology but can not remember her doctor's name. She has Mupirocin 2% ointment that she put on this TID. Is appears to be healing.   Her platelet count today is 47. She has had no episodes of bleeding or bruising. No anemia. No fatigue, fever, chills, n/v, cough, rash, headache, dizziness, SOB, chest pain, palpitations, abdominal pain or changes in her bowel or bladder habits.  No lymphadenopathy found on exam. She is still able to walk laps around her building each day. No swelling, numbness or tingling in her extremities.  She is eating well and staying hydrated. Her weight is unchanged.   Medications:    Medication List       This list is accurate as of: 09/12/15  1:25 PM.  Always use your most recent med list.               atorvastatin 20 MG tablet  Commonly known as:  LIPITOR  Take 1 tablet (20 mg total) by mouth daily.     calcium-vitamin D 500-200 MG-UNIT tablet  Take 1 tablet by mouth daily.     carvedilol 6.25 MG tablet  Commonly known as:  COREG  Take 1 tablet (6.25 mg total) by mouth 2 (two) times daily with a meal.     furosemide 20 MG tablet  Commonly known as:  LASIX  Take 1 tablet (20 mg total) by mouth daily.     irbesartan 300 MG tablet  Commonly known as:  AVAPRO  Take 1 tablet (300 mg total) by mouth daily.     KLOR-CON M20 20 MEQ tablet  Generic drug:  potassium chloride SA  TAKE 1 TABLET EVERY DAY     PRESERVISION AREDS PO  Take 1 tablet by mouth daily.     romiPLOStim 250 MCG injection  Commonly known as:  NPLATE    Inject 1 mcg/kg into the skin as needed (every 2-3 weeks for low platelets). Given @ Oncology     sertraline 25 MG tablet  Commonly known as:  ZOLOFT  TAKE 1 TABLET BY MOUTH EVERY DAY     spironolactone 25 MG tablet  Commonly known as:  ALDACTONE  Take 25 mg by mouth daily.     Vitamin D 1000 units capsule  Take 1,000 Units by mouth at bedtime.        Allergies:  Allergies  Allergen Reactions  . Ace Inhibitors Cough    Past Medical History, Surgical history, Social history, and Family History were reviewed and updated.  Review of Systems: All other 10 point review of systems is negative.   Physical Exam:  height is 4\' 10"  (1.473 m) and weight is 115 lb (52.164 kg). Her oral temperature is 97.6 F (36.4 C). Her blood pressure is 149/58 and her pulse is 58. Her respiration is 16.   Wt Readings from Last 3 Encounters:  09/12/15 115 lb (52.164 kg)  08/15/15 114 lb (51.71 kg)  07/04/15 116 lb (52.617 kg)    Ocular: Sclerae unicteric, pupils equal, round and reactive to light Ear-nose-throat: Oropharynx clear,  dentition fair Lymphatic: No cervical supraclavicular or axillary adenopathy Lungs no rales or rhonchi, good excursion bilaterally Heart regular rate and rhythm, no murmur appreciated Abd soft, nontender, positive bowel sounds, no liver or spleen tip palpated on exam MSK no focal spinal tenderness, no joint edema Neuro: non-focal, well-oriented, appropriate affect Breasts: Deferred  Lab Results  Component Value Date   WBC 6.5 09/12/2015   HGB 13.2 09/12/2015   HCT 37.5 09/12/2015   MCV 97 09/12/2015   PLT 47* 09/12/2015   No results found for: FERRITIN, IRON, TIBC, UIBC, IRONPCTSAT Lab Results  Component Value Date   RETICCTPCT 2.6 05/13/2011   RBC 3.85 09/12/2015   No results found for: Nils Pyle [facility] Lab Results  Component Value Date   IGGSERUM 720 09/29/2012   IGA 115 09/29/2012   IGMSERUM 73 09/29/2012   Lab Results   Component Value Date   TOTALPROTELP 6.9 09/29/2012   TOTALPROTELP 6.9 09/29/2012   ALBUMINELP 63.4 09/29/2012   A1GS 4.3 09/29/2012   A2GS 12.3* 09/29/2012   BETS 7.2 09/29/2012   BETA2SER 3.6 09/29/2012   GAMS 9.2* 09/29/2012   MSPIKE NOT DET 09/29/2012   SPEI  09/29/2012     Comment:     Nonspecific pattern associated with slightly decreased gamma globulins. Reviewed by Odis Hollingshead, MD, PhD, FCAP (Electronic Signature on File)     Chemistry      Component Value Date/Time   NA 138 07/04/2015 1211   NA 142 01/21/2013 1157   NA 142 11/19/2012 1750   K 4.5 07/04/2015 1211   K 5.3* 01/21/2013 1157   K 4.3 11/19/2012 1750   CL 105 07/04/2015 1211   CL 103 01/21/2013 1157   CL 109* 11/19/2012 1750   CO2 23 07/04/2015 1211   CO2 27 01/21/2013 1157   CO2 24 11/19/2012 1750   BUN 35* 07/04/2015 1211   BUN 32* 01/21/2013 1157   BUN 32* 11/19/2012 1750   CREATININE 1.58* 07/04/2015 1211   CREATININE 1.11* 05/13/2015 0457   CREATININE 1.76* 11/19/2012 1750      Component Value Date/Time   CALCIUM 9.2 07/04/2015 1211   CALCIUM 9.8 01/21/2013 1157   CALCIUM 10.6* 11/19/2012 1750   ALKPHOS 76 05/13/2015 0457   ALKPHOS 67 01/21/2013 1157   ALKPHOS 69 11/19/2012 1750   AST 21 05/13/2015 0457   AST 24 01/21/2013 1157   AST 99* 11/19/2012 1750   ALT 12* 05/13/2015 0457   ALT 18 01/21/2013 1157   ALT 52 11/19/2012 1750   BILITOT 1.3* 05/13/2015 0457   BILITOT 1.10 01/21/2013 1157   BILITOT 1.1* 11/19/2012 1750     Impression and Plan: Lisa James is a very pleasant 82 year old female with immune thrombocytopenia. She continues to do well and is asymptomatic at this time. No anemia.  Platelet count today is 47 so she will get a dose of Nplate.   We will plan to see her back in 1 month for labs and follow-up. She will contact us with any questions or concerns. We can certainly see her sooner if need be.    Eliezer Bottom, NP 2/21/20171:25 PM

## 2015-10-05 ENCOUNTER — Ambulatory Visit (INDEPENDENT_AMBULATORY_CARE_PROVIDER_SITE_OTHER): Payer: Medicare Other | Admitting: Emergency Medicine

## 2015-10-05 VITALS — BP 100/60 | HR 58 | Temp 98.3°F | Resp 16 | Ht <= 58 in | Wt 116.2 lb

## 2015-10-05 DIAGNOSIS — R82998 Other abnormal findings in urine: Secondary | ICD-10-CM

## 2015-10-05 DIAGNOSIS — N390 Urinary tract infection, site not specified: Secondary | ICD-10-CM | POA: Diagnosis not present

## 2015-10-05 DIAGNOSIS — S01302A Unspecified open wound of left ear, initial encounter: Secondary | ICD-10-CM

## 2015-10-05 DIAGNOSIS — E1122 Type 2 diabetes mellitus with diabetic chronic kidney disease: Secondary | ICD-10-CM

## 2015-10-05 DIAGNOSIS — S065X9A Traumatic subdural hemorrhage with loss of consciousness of unspecified duration, initial encounter: Secondary | ICD-10-CM

## 2015-10-05 DIAGNOSIS — S065XAA Traumatic subdural hemorrhage with loss of consciousness status unknown, initial encounter: Secondary | ICD-10-CM

## 2015-10-05 DIAGNOSIS — I6200 Nontraumatic subdural hemorrhage, unspecified: Secondary | ICD-10-CM | POA: Diagnosis not present

## 2015-10-05 DIAGNOSIS — N189 Chronic kidney disease, unspecified: Secondary | ICD-10-CM | POA: Diagnosis not present

## 2015-10-05 LAB — GLUCOSE, POCT (MANUAL RESULT ENTRY): POC Glucose: 165 mg/dl — AB (ref 70–99)

## 2015-10-05 LAB — POCT GLYCOSYLATED HEMOGLOBIN (HGB A1C): Hemoglobin A1C: 6.7

## 2015-10-05 MED ORDER — MUPIROCIN 2 % EX OINT: TOPICAL_OINTMENT | CUTANEOUS | Status: DC

## 2015-10-05 NOTE — Progress Notes (Addendum)
Patient ID: Lisa James, female   DOB: [birth year], 82 y.o.   MRN: [id number]    By signing my name below, I, Essence Howell, attest that this documentation has been prepared under the direction and in the presence of Darlyne Russian, MD Electronically Signed: Ladene Artist, ED Scribe 10/05/2015 at 11:54 AM.  Chief Complaint:  Chief Complaint  Patient presents with  . Follow-up  . Diabetes  . chronic kidney disease   HPI: Lisa James is a 82 y.o. female, with a h/o DM, CKD, who reports to [facility] today with her caregiver for a follow-up regarding DM.  Dermatology Pt was seen by dermatology 2.5 weeks ago to have a skin spot removed. Her caregiver, Hinton Dyer, has been cleaning the area with Dial soap and Neosporin. She has a follow-up appointment in 6 weeks.   Pt recently received an infusion from oncologist Dr. Marin Olp for thrombocytopenia and neurosurgeon Dr. Sherwood Gambler for subdural hematoma.    Past Medical History  Diagnosis Date  . Acute MI, lateral wall ([place])   . Chronic systolic heart failure ([place])   . Ischemic heart disease   . Mitral regurgitation   . HTN (hypertension)   . 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 Immune thrombocytopenic purpura ([place]) 01/21/2013  . Coronary artery disease     sees Dr Claiborne Billings every 6 months  . DM (diabetes mellitus) ([place])     type 2 ; diet controlled  . Thrombocytopenia ([place])     sees Dr Marin Olp every 2 weeks ;receives Nplate prn  . Subdural hematoma, post-traumatic ([place]) 2015    sees Dr Sherwood Gambler every 6 months  . Aortic stenosis   . Chronic kidney disease    Past Surgical History  Procedure Laterality Date  . Coronary artery bypass graft  11/20/2007    x5  . Bone marrow biopsy  11/22/2002    left posterior iliac crest bone marrow biopsy and aspirate  . Submental lymph node excisional biopsy  10/22/2002  . Eye surgery    . Cataract extraction w/ intraocular lens  implant, bilateral Bilateral    Social History   Social History    . Marital Status: Widowed    Spouse Name: N/A  . Number of Children: N/A  . Years of Education: N/A   Social History Main Topics  . Smoking status: Never Smoker   . Smokeless tobacco: Never Used     Comment: never used tobacco.  . Alcohol Use: No  . Drug Use: No  . Sexual Activity: No   Other Topics Concern  . Not on file   Social History Narrative   Family History  Problem Relation Age of Onset  . Heart attack Father   . Diabetes Father    Allergies  Allergen Reactions  . Ace Inhibitors Cough   Prior to Admission medications   Medication Sig Start Date End Date Taking? Authorizing Provider  atorvastatin (LIPITOR) 20 MG tablet Take 1 tablet (20 mg total) by mouth daily. 07/28/15   Darlyne Russian, MD  Calcium Carbonate-Vitamin D (CALCIUM-VITAMIN D) 500-200 MG-UNIT per tablet Take 1 tablet by mouth daily.     Historical Provider, MD  carvedilol (COREG) 6.25 MG tablet Take 1 tablet (6.25 mg total) by mouth 2 (two) times daily with a meal. 07/28/15   Darlyne Russian, MD  Cholecalciferol (VITAMIN D) 1000 UNITS capsule Take 1,000 Units by mouth at bedtime.     Historical Provider, MD  furosemide (  LASIX) 20 MG tablet Take 1 tablet (20 mg total) by mouth daily. 03/13/15   Darlyne Russian, MD  irbesartan (AVAPRO) 300 MG tablet Take 1 tablet (300 mg total) by mouth daily. 03/13/15   Troy Sine, MD  KLOR-CON M20 20 MEQ tablet TAKE 1 TABLET EVERY DAY 09/07/15   Darlyne Russian, MD  Multiple Vitamins-Minerals (PRESERVISION AREDS PO) Take 1 tablet by mouth daily.    Historical Provider, MD  romiPLOStim (NPLATE) 250 MCG injection Inject 1 mcg/kg into the skin as needed (every 2-3 weeks for low platelets). Given @ Oncology    Historical Provider, MD  sertraline (ZOLOFT) 25 MG tablet TAKE 1 TABLET BY MOUTH EVERY DAY 09/07/15   Darlyne Russian, MD  spironolactone (ALDACTONE) 25 MG tablet Take 25 mg by mouth daily.    Historical Provider, MD   ROS: The patient denies fevers, chills, night sweats,  unintentional weight loss, chest pain, palpitations, wheezing, dyspnea on exertion, nausea, vomiting, abdominal pain, dysuria, hematuria, melena, numbness, weakness, or tingling. +wound  All other systems have been reviewed and were otherwise negative with the exception of those mentioned in the HPI and as above.    PHYSICAL EXAM: Filed Vitals:   10/05/15 1129  BP: 100/60  Pulse: 58  Temp: 98.3 F (36.8 C)  Resp: 16   Body mass index is 24.29 kg/(m^2).  General: Alert, no acute distress, cheerful  HEENT:  Normocephalic, atraumatic, oropharynx patent. Eye: Juliette Mangle [facility] Cardiovascular: Regular rate and rhythm, 2/6 harsh systolic murmur at the base of the heart. No Carotid bruits, radial pulse intact. No pedal edema.  Respiratory: Clear to auscultation bilaterally. No wheezes, rales, or rhonchi. No cyanosis, no use of accessory musculature Abdominal: No organomegaly, abdomen is soft and non-tender, positive bowel sounds. No masses. Musculoskeletal: Gait intact. No edema, tenderness Skin: No rashes.  1x1 cm open area at the base of the L earlobe with minimal surrounding redness.  Neurologic: Facial musculature symmetric. Psychiatric: Patient acts appropriately throughout our interaction. Lymphatic: No cervical or submandibular lymphadenopathy  LABS: Results for orders placed or performed in visit on 10/05/15  POCT glucose (manual entry)  Result Value Ref Range   POC Glucose 165 (A) 70 - 99 mg/dl  POCT glycosylated hemoglobin (Hb A1C)  Result Value Ref Range   Hemoglobin A1C 6.7    EKG/XRAY:   Primary read interpreted by Dr. Everlene Farrier at [facility].  ASSESSMENT/PLAN:  Hemoglobin A1c is 6.7. I am comfortable with this. We need to absolutely avoid hypoglycemia in this patient. No other change in medications. She was given referral sheets to [facility] and [facility] for follow-up.I personally performed the services described in this documentation, which was scribed in my presence. The recorded  information has been reviewed and is accurate.    Gross sideeffects, risk and benefits, and alternatives of medications d/w patient. Patient is aware that all medications have potential sideeffects and we are unable to predict every sideeffect or drug-drug interaction that may occur.  Arlyss Queen MD 10/05/2015 11:41 AM

## 2015-10-05 NOTE — Patient Instructions (Signed)
     IF you received an x-ray today, you will receive an invoice from [facility]. Please contact [facility] at [phone] with questions or concerns regarding your invoice.   IF you received labwork today, you will receive an invoice from [facility]. Please contact [facility] at [phone] with questions or concerns regarding your invoice.   Our billing staff will not be able to assist you with questions regarding bills from these companies.  You will be contacted with the lab results as soon as they are available. The fastest way to get your results is to activate your My Chart account. Instructions are located on the last page of this paperwork. If you have not heard from us regarding the results in 2 weeks, please contact this office.      

## 2015-10-10 ENCOUNTER — Encounter: Payer: Self-pay | Admitting: Family

## 2015-10-10 ENCOUNTER — Other Ambulatory Visit (HOSPITAL_BASED_OUTPATIENT_CLINIC_OR_DEPARTMENT_OTHER): Payer: Medicare Other

## 2015-10-10 ENCOUNTER — Ambulatory Visit (HOSPITAL_BASED_OUTPATIENT_CLINIC_OR_DEPARTMENT_OTHER): Payer: Medicare Other

## 2015-10-10 ENCOUNTER — Ambulatory Visit (HOSPITAL_BASED_OUTPATIENT_CLINIC_OR_DEPARTMENT_OTHER): Payer: Medicare Other | Admitting: Family

## 2015-10-10 VITALS — BP 134/68 | HR 57 | Temp 97.5°F | Resp 18 | Ht <= 58 in | Wt 114.0 lb

## 2015-10-10 DIAGNOSIS — D693 Immune thrombocytopenic purpura: Secondary | ICD-10-CM

## 2015-10-10 LAB — CBC WITH DIFFERENTIAL (CANCER CENTER ONLY)
BASO#: 0 10*3/uL (ref 0.0–0.2)
BASO%: 0.3 % (ref 0.0–2.0)
EOS ABS: 0.4 10*3/uL (ref 0.0–0.5)
EOS%: 6.8 % (ref 0.0–7.0)
HEMATOCRIT: 37.3 % (ref 34.8–46.6)
HGB: 13.2 g/dL (ref 11.6–15.9)
LYMPH#: 1.2 10*3/uL (ref 0.9–3.3)
LYMPH%: 19.9 % (ref 14.0–48.0)
MCH: 34.3 pg — AB (ref 26.0–34.0)
MCHC: 35.4 g/dL (ref 32.0–36.0)
MCV: 97 fL (ref 81–101)
MONO#: 0.6 10*3/uL (ref 0.1–0.9)
MONO%: 10.7 % (ref 0.0–13.0)
NEUT#: 3.7 10*3/uL (ref 1.5–6.5)
NEUT%: 62.3 % (ref 39.6–80.0)
PLATELETS: 44 10*3/uL — AB (ref 145–400)
RBC: 3.85 10*6/uL (ref 3.70–5.32)
RDW: 11.8 % (ref 11.1–15.7)
WBC: 5.9 10*3/uL (ref 3.9–10.0)

## 2015-10-10 LAB — CHCC SATELLITE - SMEAR

## 2015-10-10 MED ORDER — ROMIPLOSTIM 250 MCG ~~LOC~~ SOLR
3.9000 ug/kg | Freq: Once | SUBCUTANEOUS | Status: AC
  Administered 2015-10-10: 200 ug via SUBCUTANEOUS
  Filled 2015-10-10: qty 0.4

## 2015-10-10 NOTE — Progress Notes (Signed)
Hematology and Oncology Follow Up Visit  Lisa James [id number] 04/27/1928 82 y.o. 10/10/2015   Principle Diagnosis:  Thrombocytopenia-likely immune thrombocytopenia  Current Therapy:   Nplate as indicated    Interim History: Lisa James is here today with her health care worker for a follow-up. She is doing quite well and continues to stay active walking and participating in activities in her assisted living facility.  Her platlet count at this time is 44. She has had no episodes of bleeding, bruising or petechiae.  No fatigue, fever, chills, n/v, cough, rash, headache, dizziness, SOB, chest pain, palpitations, abdominal pain or changes in her bowel or bladder habits.  No lymphadenopathy found on exam. No swelling, tenderness, numbness or tingling. No c/o of joint aches or bone pain.  She continues to have a good appetite and is staying well hydrated. Her weight is stable.   Medications:    Medication List       This list is accurate as of: 10/10/15 12:24 PM.  Always use your most recent med list.               atorvastatin 20 MG tablet  Commonly known as:  LIPITOR  Take 1 tablet (20 mg total) by mouth daily.     calcium-vitamin D 500-200 MG-UNIT tablet  Take 1 tablet by mouth daily.     carvedilol 6.25 MG tablet  Commonly known as:  COREG  Take 1 tablet (6.25 mg total) by mouth 2 (two) times daily with a meal.     furosemide 20 MG tablet  Commonly known as:  LASIX  Take 1 tablet (20 mg total) by mouth daily.     irbesartan 300 MG tablet  Commonly known as:  AVAPRO  Take 1 tablet (300 mg total) by mouth daily.     KLOR-CON M20 20 MEQ tablet  Generic drug:  potassium chloride SA  TAKE 1 TABLET EVERY DAY     mupirocin ointment 2 %  Commonly known as:  BACTROBAN  Apply small amount to surgical area left ear twice a day     PRESERVISION AREDS PO  Take 1 tablet by mouth daily.     romiPLOStim 250 MCG injection  Commonly known as:  NPLATE  Inject 1 mcg/kg  into the skin as needed (every 2-3 weeks for low platelets). Given @ Oncology     sertraline 25 MG tablet  Commonly known as:  ZOLOFT  TAKE 1 TABLET BY MOUTH EVERY DAY     spironolactone 25 MG tablet  Commonly known as:  ALDACTONE  Take 25 mg by mouth daily.     Vitamin D 1000 units capsule  Take 1,000 Units by mouth at bedtime.        Allergies:  Allergies  Allergen Reactions  . Ace Inhibitors Cough    Past Medical History, Surgical history, Social history, and Family History were reviewed and updated.  Review of Systems: All other 10 point review of systems is negative.   Physical Exam:  height is 4\' 10"  (1.473 m) and weight is 114 lb (51.71 kg). Her oral temperature is 97.5 F (36.4 C). Her blood pressure is 134/68 and her pulse is 57. Her respiration is 18.   Wt Readings from Last 3 Encounters:  10/10/15 114 lb (51.71 kg)  10/05/15 116 lb 3.2 oz (52.708 kg)  09/12/15 115 lb (52.164 kg)    Ocular: Sclerae unicteric, pupils equal, round and reactive to light Ear-nose-throat: Oropharynx clear, dentition fair Lymphatic: No cervical  supraclavicular or axillary adenopathy Lungs no rales or rhonchi, good excursion bilaterally Heart regular rate and rhythm, no murmur appreciated Abd soft, nontender, positive bowel sounds, no liver or spleen tip palpated on exam, no fluid wave  MSK no focal spinal tenderness, no joint edema Neuro: non-focal, well-oriented, appropriate affect Breasts: Deferred  Lab Results  Component Value Date   WBC 5.9 10/10/2015   HGB 13.2 10/10/2015   HCT 37.3 10/10/2015   MCV 97 10/10/2015   PLT 44* 10/10/2015   No results found for: FERRITIN, IRON, TIBC, UIBC, IRONPCTSAT Lab Results  Component Value Date   RETICCTPCT 2.6 05/13/2011   RBC 3.85 10/10/2015   No results found for: Nils Pyle [facility] Lab Results  Component Value Date   IGGSERUM 720 09/29/2012   IGA 115 09/29/2012   IGMSERUM 73 09/29/2012   Lab Results    Component Value Date   TOTALPROTELP 6.9 09/29/2012   TOTALPROTELP 6.9 09/29/2012   ALBUMINELP 63.4 09/29/2012   A1GS 4.3 09/29/2012   A2GS 12.3* 09/29/2012   BETS 7.2 09/29/2012   BETA2SER 3.6 09/29/2012   GAMS 9.2* 09/29/2012   MSPIKE NOT DET 09/29/2012   SPEI  09/29/2012     Comment:     Nonspecific pattern associated with slightly decreased gamma globulins. Reviewed by Odis Hollingshead, MD, PhD, FCAP (Electronic Signature on File)     Chemistry      Component Value Date/Time   NA 138 07/04/2015 1211   NA 142 01/21/2013 1157   NA 142 11/19/2012 1750   K 4.5 07/04/2015 1211   K 5.3* 01/21/2013 1157   K 4.3 11/19/2012 1750   CL 105 07/04/2015 1211   CL 103 01/21/2013 1157   CL 109* 11/19/2012 1750   CO2 23 07/04/2015 1211   CO2 27 01/21/2013 1157   CO2 24 11/19/2012 1750   BUN 35* 07/04/2015 1211   BUN 32* 01/21/2013 1157   BUN 32* 11/19/2012 1750   CREATININE 1.58* 07/04/2015 1211   CREATININE 1.11* 05/13/2015 0457   CREATININE 1.76* 11/19/2012 1750      Component Value Date/Time   CALCIUM 9.2 07/04/2015 1211   CALCIUM 9.8 01/21/2013 1157   CALCIUM 10.6* 11/19/2012 1750   ALKPHOS 76 05/13/2015 0457   ALKPHOS 67 01/21/2013 1157   ALKPHOS 69 11/19/2012 1750   AST 21 05/13/2015 0457   AST 24 01/21/2013 1157   AST 99* 11/19/2012 1750   ALT 12* 05/13/2015 0457   ALT 18 01/21/2013 1157   ALT 52 11/19/2012 1750   BILITOT 1.3* 05/13/2015 0457   BILITOT 1.10 01/21/2013 1157   BILITOT 1.1* 11/19/2012 1750     Impression and Plan: Lisa James is a very pleasant 82 year old female with immune thrombocytopenia. She continues to do well and is asymptomatic at this time. No anemia. WBC count is 5.9.  Platelet count today is 44 so we will proceed with her Nplate injection today as planned.  We will plan to see her back in 1 month for labs and follow-up. She will contact us with any questions or concerns. We can certainly see her sooner if need be.    Eliezer Bottom, NP 3/21/201712:24 PM

## 2015-10-10 NOTE — Patient Instructions (Signed)
Romiplostim injection What is this medicine? ROMIPLOSTIM (roe mi PLOE stim) helps your body make more platelets. This medicine is used to treat low platelets caused by chronic idiopathic thrombocytopenic purpura (ITP). This medicine may be used for other purposes; ask your health care provider or pharmacist if you have questions. What should I tell my health care provider before I take this medicine? They need to know if you have any of these conditions: -cancer or myelodysplastic syndrome -low blood counts, like low white cell, platelet, or red cell counts -take medicines that treat or prevent blood clots -an unusual or allergic reaction to romiplostim, mannitol, other medicines, foods, dyes, or preservatives -pregnant or trying to get pregnant -breast-feeding How should I use this medicine? This medicine is for injection under the skin. It is given by a health care professional in a hospital or clinic setting. A special MedGuide will be given to you before your injection. Read this information carefully each time. Talk to your pediatrician regarding the use of this medicine in children. Special care may be needed. Overdosage: If you think you have taken too much of this medicine contact a poison control center or emergency room at once. NOTE: This medicine is only for you. Do not share this medicine with others. What if I miss a dose? It is important not to miss your dose. Call your doctor or health care professional if you are unable to keep an appointment. What may interact with this medicine? Interactions are not expected. This list may not describe all possible interactions. Give your health care provider a list of all the medicines, herbs, non-prescription drugs, or dietary supplements you use. Also tell them if you smoke, drink alcohol, or use illegal drugs. Some items may interact with your medicine. What should I watch for while using this medicine? Your condition will be monitored  carefully while you are receiving this medicine. Visit your prescriber or health care professional for regular checks on your progress and for the needed blood tests. It is important to keep all appointments. What side effects may I notice from receiving this medicine? Side effects that you should report to your doctor or health care professional as soon as possible: -allergic reactions like skin rash, itching or hives, swelling of the face, lips, or tongue -shortness of breath, chest pain, swelling in a leg -unusual bleeding or bruising Side effects that usually do not require medical attention (report to your doctor or health care professional if they continue or are bothersome): -dizziness -headache -muscle aches -pain in arms and legs -stomach pain -trouble sleeping This list may not describe all possible side effects. Call your doctor for medical advice about side effects. You may report side effects to FDA at 1-800-FDA-1088. Where should I keep my medicine? This drug is given in a hospital or clinic and will not be stored at home. NOTE: This sheet is a summary. It may not cover all possible information. If you have questions about this medicine, talk to your doctor, pharmacist, or health care provider.    2016, Elsevier/Gold Standard. (2008-03-07 15:13:04)  

## 2015-11-01 ENCOUNTER — Other Ambulatory Visit: Payer: Self-pay | Admitting: Emergency Medicine

## 2015-11-01 DIAGNOSIS — D693 Immune thrombocytopenic purpura: Secondary | ICD-10-CM

## 2015-11-01 DIAGNOSIS — N189 Chronic kidney disease, unspecified: Secondary | ICD-10-CM

## 2015-11-06 NOTE — Telephone Encounter (Signed)
Dr Everlene Farrier, I just wanted to make sure that with pt's Dx of CKD you are OK refilling the Lasix?

## 2015-11-07 NOTE — Telephone Encounter (Signed)
It is okay to refill her medication but we need to talk to her niece who lives in [place] and see when she can come by for a repeat basic metabolic panel so we can check her kidney function and potassium. With her age she may need to change to Lasix every other day.

## 2015-11-08 ENCOUNTER — Other Ambulatory Visit: Payer: Medicare Other

## 2015-11-08 ENCOUNTER — Ambulatory Visit: Payer: Medicare Other | Admitting: Family

## 2015-11-08 ENCOUNTER — Ambulatory Visit: Payer: Medicare Other

## 2015-11-08 NOTE — Telephone Encounter (Signed)
Called niece and advised her she should bring pt in for repeat labs. Niece agreed, but Dr Everlene Farrier, is it OK to put orders in for Lab Only visit or do you need to see pt again now?

## 2015-11-08 NOTE — Telephone Encounter (Signed)
He can be a lab only encounter for a CBC and a basic metabolic panel.

## 2015-11-09 NOTE — Addendum Note (Signed)
Addended by: Elwyn Reach A on: 11/09/2015 09:06 AM   Modules accepted: Orders

## 2015-11-09 NOTE — Telephone Encounter (Signed)
Put in orders and called and advised niece who agreed to bring pt in.

## 2015-11-10 ENCOUNTER — Ambulatory Visit (HOSPITAL_BASED_OUTPATIENT_CLINIC_OR_DEPARTMENT_OTHER): Payer: Medicare Other | Admitting: Hematology & Oncology

## 2015-11-10 ENCOUNTER — Encounter: Payer: Self-pay | Admitting: Hematology & Oncology

## 2015-11-10 ENCOUNTER — Other Ambulatory Visit (HOSPITAL_BASED_OUTPATIENT_CLINIC_OR_DEPARTMENT_OTHER): Payer: Medicare Other

## 2015-11-10 ENCOUNTER — Ambulatory Visit (HOSPITAL_BASED_OUTPATIENT_CLINIC_OR_DEPARTMENT_OTHER): Payer: Medicare Other

## 2015-11-10 VITALS — BP 156/74 | HR 56 | Temp 97.5°F | Wt 115.0 lb

## 2015-11-10 DIAGNOSIS — D693 Immune thrombocytopenic purpura: Secondary | ICD-10-CM

## 2015-11-10 LAB — CBC WITH DIFFERENTIAL (CANCER CENTER ONLY)
BASO#: 0 10*3/uL (ref 0.0–0.2)
BASO%: 0.3 % (ref 0.0–2.0)
EOS%: 8.7 % — AB (ref 0.0–7.0)
Eosinophils Absolute: 0.5 10*3/uL (ref 0.0–0.5)
HEMATOCRIT: 37 % (ref 34.8–46.6)
HGB: 13 g/dL (ref 11.6–15.9)
LYMPH#: 1.1 10*3/uL (ref 0.9–3.3)
LYMPH%: 18.3 % (ref 14.0–48.0)
MCH: 34.1 pg — ABNORMAL HIGH (ref 26.0–34.0)
MCHC: 35.1 g/dL (ref 32.0–36.0)
MCV: 97 fL (ref 81–101)
MONO#: 0.7 10*3/uL (ref 0.1–0.9)
MONO%: 11.3 % (ref 0.0–13.0)
NEUT#: 3.5 10*3/uL (ref 1.5–6.5)
NEUT%: 61.4 % (ref 39.6–80.0)
Platelets: 35 10*3/uL — ABNORMAL LOW (ref 145–400)
RBC: 3.81 10*6/uL (ref 3.70–5.32)
RDW: 12 % (ref 11.1–15.7)
WBC: 5.7 10*3/uL (ref 3.9–10.0)

## 2015-11-10 LAB — CHCC SATELLITE - SMEAR

## 2015-11-10 MED ORDER — ROMIPLOSTIM 250 MCG ~~LOC~~ SOLR
3.8000 ug/kg | Freq: Once | SUBCUTANEOUS | Status: AC
  Administered 2015-11-10: 200 ug via SUBCUTANEOUS
  Filled 2015-11-10: qty 0.4

## 2015-11-10 NOTE — Patient Instructions (Signed)
Romiplostim injection What is this medicine? ROMIPLOSTIM (roe mi PLOE stim) helps your body make more platelets. This medicine is used to treat low platelets caused by chronic idiopathic thrombocytopenic purpura (ITP). This medicine may be used for other purposes; ask your health care provider or pharmacist if you have questions. What should I tell my health care provider before I take this medicine? They need to know if you have any of these conditions: -cancer or myelodysplastic syndrome -low blood counts, like low white cell, platelet, or red cell counts -take medicines that treat or prevent blood clots -an unusual or allergic reaction to romiplostim, mannitol, other medicines, foods, dyes, or preservatives -pregnant or trying to get pregnant -breast-feeding How should I use this medicine? This medicine is for injection under the skin. It is given by a health care professional in a hospital or clinic setting. A special MedGuide will be given to you before your injection. Read this information carefully each time. Talk to your pediatrician regarding the use of this medicine in children. Special care may be needed. Overdosage: If you think you have taken too much of this medicine contact a poison control center or emergency room at once. NOTE: This medicine is only for you. Do not share this medicine with others. What if I miss a dose? It is important not to miss your dose. Call your doctor or health care professional if you are unable to keep an appointment. What may interact with this medicine? Interactions are not expected. This list may not describe all possible interactions. Give your health care provider a list of all the medicines, herbs, non-prescription drugs, or dietary supplements you use. Also tell them if you smoke, drink alcohol, or use illegal drugs. Some items may interact with your medicine. What should I watch for while using this medicine? Your condition will be monitored  carefully while you are receiving this medicine. Visit your prescriber or health care professional for regular checks on your progress and for the needed blood tests. It is important to keep all appointments. What side effects may I notice from receiving this medicine? Side effects that you should report to your doctor or health care professional as soon as possible: -allergic reactions like skin rash, itching or hives, swelling of the face, lips, or tongue -shortness of breath, chest pain, swelling in a leg -unusual bleeding or bruising Side effects that usually do not require medical attention (report to your doctor or health care professional if they continue or are bothersome): -dizziness -headache -muscle aches -pain in arms and legs -stomach pain -trouble sleeping This list may not describe all possible side effects. Call your doctor for medical advice about side effects. You may report side effects to FDA at 1-800-FDA-1088. Where should I keep my medicine? This drug is given in a hospital or clinic and will not be stored at home. NOTE: This sheet is a summary. It may not cover all possible information. If you have questions about this medicine, talk to your doctor, pharmacist, or health care provider.    2016, Elsevier/Gold Standard. (2008-03-07 15:13:04)  

## 2015-11-10 NOTE — Progress Notes (Signed)
Hematology and Oncology Follow Up Visit  Lisa James [id number] [birth year] 82 y.o. 11/10/2015   Principle Diagnosis:   Thrombocytopenia-likely immune thrombocytopenia  Current Therapy:   Nplate as indicated- maintain platelet count > than 70,000     Interim History:  Ms.  Coffey is back for followup. She looks well. She feels well. She is pretty active at the nursing home. She had a very nice Easter. She would do not do all that much.  She's had no bleeding.  Her appetite has been pretty good. She's had no nausea or vomiting. She's had no leg swelling. She's had no rashes. She's had no bruising.  She's had no cough. She's had no fever. She had no obvious infections.  Overall, her performance status is ECOG 3. Medications:  Current outpatient prescriptions:  .  atorvastatin (LIPITOR) 20 MG tablet, Take 1 tablet (20 mg total) by mouth daily., Disp: 90 tablet, Rfl: 1 .  Calcium Carbonate-Vitamin D (CALCIUM-VITAMIN D) 500-200 MG-UNIT per tablet, Take 1 tablet by mouth daily. , Disp: , Rfl:  .  carvedilol (COREG) 6.25 MG tablet, Take 1 tablet (6.25 mg total) by mouth 2 (two) times daily with a meal., Disp: 180 tablet, Rfl: 1 .  Cholecalciferol (VITAMIN D) 1000 UNITS capsule, Take 1,000 Units by mouth at bedtime. , Disp: , Rfl:  .  furosemide (LASIX) 20 MG tablet, TAKE 1 TABLET (20 MG TOTAL) BY MOUTH DAILY., Disp: 90 tablet, Rfl: 1 .  irbesartan (AVAPRO) 300 MG tablet, Take 1 tablet (300 mg total) by mouth daily., Disp: 30 tablet, Rfl: 3 .  KLOR-CON M20 20 MEQ tablet, TAKE 1 TABLET EVERY DAY, Disp: 90 tablet, Rfl: 3 .  Multiple Vitamins-Minerals (PRESERVISION AREDS PO), Take 1 tablet by mouth daily., Disp: , Rfl:  .  mupirocin ointment (BACTROBAN) 2 %, Apply small amount to surgical area left ear twice a day, Disp: 22 g, Rfl: 0 .  romiPLOStim (NPLATE) 250 MCG injection, Inject 1 mcg/kg into the skin as needed (every 2-3 weeks for low platelets). Given @ Oncology, Disp: , Rfl:  .   sertraline (ZOLOFT) 25 MG tablet, TAKE 1 TABLET BY MOUTH EVERY DAY, Disp: 90 tablet, Rfl: 1 .  spironolactone (ALDACTONE) 25 MG tablet, Take 25 mg by mouth daily., Disp: , Rfl:   Allergies:  Allergies  Allergen Reactions  . Ace Inhibitors Cough    Past Medical History, Surgical history, Social history, and Family History were reviewed and updated.  Review of Systems: As above  Physical Exam:  weight is 115 lb (52.164 kg). Her oral temperature is 97.5 F (36.4 C). Her blood pressure is 156/74 and her pulse is 56.   Totally, petite white female in no obvious distress. Head and neck exam shows no ocular or oral lesions. Pupils reacted properly. Extraocular muscles are intact. There is no adenopathy in the neck. Lungs are clear. Cardiac exam is regular and rhythm . She has a 1/6 systolic ejection murmur. Abdomen is soft. She has good bowel sounds. There is no fluid wave. There is no palpable liver or spleen tip. Back exam shows no tenderness over the spine. She has some slight kyphosis. Extremities shows no clubbing cyanosis or edema. She has age related osteoarthritic changes. She has good strength bilaterally. Neurological exam is without any deficits. Skin exam shows no rashes, ecchymoses or petechia.  Lab Results  Component Value Date   WBC 5.7 11/10/2015   HGB 13.0 11/10/2015   HCT 37.0 11/10/2015   MCV 97 11/10/2015  PLT 35* 11/10/2015     Chemistry      Component Value Date/Time   NA 138 07/04/2015 1211   NA 142 01/21/2013 1157   NA 142 11/19/2012 1750   K 4.5 07/04/2015 1211   K 5.3* 01/21/2013 1157   K 4.3 11/19/2012 1750   CL 105 07/04/2015 1211   CL 103 01/21/2013 1157   CL 109* 11/19/2012 1750   CO2 23 07/04/2015 1211   CO2 27 01/21/2013 1157   CO2 24 11/19/2012 1750   BUN 35* 07/04/2015 1211   BUN 32* 01/21/2013 1157   BUN 32* 11/19/2012 1750   CREATININE 1.58* 07/04/2015 1211   CREATININE 1.11* 05/13/2015 0457   CREATININE 1.76* 11/19/2012 1750       Component Value Date/Time   CALCIUM 9.2 07/04/2015 1211   CALCIUM 9.8 01/21/2013 1157   CALCIUM 10.6* 11/19/2012 1750   ALKPHOS 76 05/13/2015 0457   ALKPHOS 67 01/21/2013 1157   ALKPHOS 69 11/19/2012 1750   AST 21 05/13/2015 0457   AST 24 01/21/2013 1157   AST 99* 11/19/2012 1750   ALT 12* 05/13/2015 0457   ALT 18 01/21/2013 1157   ALT 52 11/19/2012 1750   BILITOT 1.3* 05/13/2015 0457   BILITOT 1.10 01/21/2013 1157   BILITOT 1.1* 11/19/2012 1750         Impression and Plan: Ms. Tysinger is a 82 year old female with thrombocytopenia. This is likely immune thrombocytopenia. I actually did do a bone marrow on her a couple of years ago..  She responds well to Nplate. We will go ahead and give her Nplate today.  I want to get her back in 4 weeks. We will see what her platelet count is at that point.     Volanda Napoleon, MD 4/21/201710:33 AM

## 2015-12-12 ENCOUNTER — Ambulatory Visit: Payer: Medicare Other

## 2015-12-12 ENCOUNTER — Other Ambulatory Visit: Payer: Medicare Other

## 2015-12-12 ENCOUNTER — Ambulatory Visit: Payer: Medicare Other | Admitting: Family

## 2015-12-14 ENCOUNTER — Ambulatory Visit (HOSPITAL_BASED_OUTPATIENT_CLINIC_OR_DEPARTMENT_OTHER): Payer: Medicare Other | Admitting: Family

## 2015-12-14 ENCOUNTER — Other Ambulatory Visit (HOSPITAL_BASED_OUTPATIENT_CLINIC_OR_DEPARTMENT_OTHER): Payer: Medicare Other

## 2015-12-14 ENCOUNTER — Ambulatory Visit (HOSPITAL_BASED_OUTPATIENT_CLINIC_OR_DEPARTMENT_OTHER): Payer: Medicare Other

## 2015-12-14 ENCOUNTER — Encounter: Payer: Self-pay | Admitting: Family

## 2015-12-14 VITALS — BP 139/56 | HR 57 | Temp 97.5°F | Resp 14 | Ht <= 58 in | Wt 114.0 lb

## 2015-12-14 DIAGNOSIS — D693 Immune thrombocytopenic purpura: Secondary | ICD-10-CM | POA: Diagnosis not present

## 2015-12-14 LAB — CBC WITH DIFFERENTIAL (CANCER CENTER ONLY)
BASO#: 0 10*3/uL (ref 0.0–0.2)
BASO%: 0.3 % (ref 0.0–2.0)
EOS ABS: 0.7 10*3/uL — AB (ref 0.0–0.5)
EOS%: 11.1 % — ABNORMAL HIGH (ref 0.0–7.0)
HCT: 37.7 % (ref 34.8–46.6)
HGB: 13.3 g/dL (ref 11.6–15.9)
LYMPH#: 1 10*3/uL (ref 0.9–3.3)
LYMPH%: 16.1 % (ref 14.0–48.0)
MCH: 34.1 pg — AB (ref 26.0–34.0)
MCHC: 35.3 g/dL (ref 32.0–36.0)
MCV: 97 fL (ref 81–101)
MONO#: 0.6 10*3/uL (ref 0.1–0.9)
MONO%: 10.1 % (ref 0.0–13.0)
NEUT#: 4 10*3/uL (ref 1.5–6.5)
NEUT%: 62.4 % (ref 39.6–80.0)
RBC: 3.9 10*6/uL (ref 3.70–5.32)
RDW: 12.4 % (ref 11.1–15.7)
WBC: 6.3 10*3/uL (ref 3.9–10.0)

## 2015-12-14 LAB — CHCC SATELLITE - SMEAR

## 2015-12-14 MED ORDER — ROMIPLOSTIM 250 MCG ~~LOC~~ SOLR
4.8000 ug/kg | Freq: Once | SUBCUTANEOUS | Status: AC
  Administered 2015-12-14: 250 ug via SUBCUTANEOUS
  Filled 2015-12-14: qty 0.5

## 2015-12-14 NOTE — Progress Notes (Signed)
Hematology and Oncology Follow Up Visit  Lisa James [id number] [birth year] 82 y.o. 12/14/2015   Principle Diagnosis:  Thrombocytopenia-likely immune thrombocytopenia  Current Therapy:   Nplate as indicated - maintain platelet count > than 70,000    Interim History: Lisa James is here today with her health care worker for a follow-up. She is doing well and has no complaints at this time. She is staying active walking laps around her assisted living facility.  Her platlet count at this time is 44. She has had no episodes of bleeding, bruising or petechiae.  No lymphadenopathy found on exam.  No fatigue, fever, chills, n/v, cough, rash, headache, dizziness, SOB, chest pain, palpitations, abdominal pain or changes in her bowel or bladder habits.  No swelling, tenderness, numbness or tingling. No c/o of joint aches or bone pain.  No falls or syncopal episodes.  She is eating well and is staying hydrated. Her weight is unchanged.   Medications:    Medication List       This list is accurate as of: 12/14/15  9:05 AM.  Always use your most recent med list.               atorvastatin 20 MG tablet  Commonly known as:  LIPITOR  Take 1 tablet (20 mg total) by mouth daily.     calcium-vitamin D 500-200 MG-UNIT tablet  Take 1 tablet by mouth daily.     carvedilol 6.25 MG tablet  Commonly known as:  COREG  Take 1 tablet (6.25 mg total) by mouth 2 (two) times daily with a meal.     furosemide 20 MG tablet  Commonly known as:  LASIX  TAKE 1 TABLET (20 MG TOTAL) BY MOUTH DAILY.     irbesartan 300 MG tablet  Commonly known as:  AVAPRO  Take 1 tablet (300 mg total) by mouth daily.     KLOR-CON M20 20 MEQ tablet  Generic drug:  potassium chloride SA  TAKE 1 TABLET EVERY DAY     mupirocin ointment 2 %  Commonly known as:  BACTROBAN  Apply small amount to surgical area left ear twice a day     PRESERVISION AREDS PO  Take 1 tablet by mouth daily.     romiPLOStim 250 MCG  injection  Commonly known as:  NPLATE  Inject 1 mcg/kg into the skin as needed (every 2-3 weeks for low platelets). Given @ Oncology     sertraline 25 MG tablet  Commonly known as:  ZOLOFT  TAKE 1 TABLET BY MOUTH EVERY DAY     spironolactone 25 MG tablet  Commonly known as:  ALDACTONE  Take 25 mg by mouth daily.     Vitamin D 1000 units capsule  Take 1,000 Units by mouth at bedtime.        Allergies:  Allergies  Allergen Reactions  . Ace Inhibitors Cough    Past Medical History, Surgical history, Social history, and Family History were reviewed and updated.  Review of Systems: All other 10 point review of systems is negative.   Physical Exam:  height is 4\' 10"  (1.473 m) and weight is 114 lb (51.71 kg). Her oral temperature is 97.5 F (36.4 C). Her blood pressure is 139/56 and her pulse is 57. Her respiration is 14.   Wt Readings from Last 3 Encounters:  12/14/15 114 lb (51.71 kg)  11/10/15 115 lb (52.164 kg)  10/10/15 114 lb (51.71 kg)    Ocular: Sclerae unicteric, pupils equal, round and  reactive to light Ear-nose-throat: Oropharynx clear, dentition fair Lymphatic: No cervical supraclavicular or axillary adenopathy Lungs no rales or rhonchi, good excursion bilaterally Heart regular rate and rhythm, no murmur appreciated Abd soft, nontender, positive bowel sounds, no liver or spleen tip palpated on exam, no fluid wave  MSK no focal spinal tenderness, no joint edema Neuro: non-focal, well-oriented, appropriate affect Breasts: Deferred  Lab Results  Component Value Date   WBC 6.3 12/14/2015   HGB 13.3 12/14/2015   HCT 37.7 12/14/2015   MCV 97 12/14/2015   PLT 44 Platelet count consistent in citrate* 12/14/2015   No results found for: FERRITIN, IRON, TIBC, UIBC, IRONPCTSAT Lab Results  Component Value Date   RETICCTPCT 2.6 05/13/2011   RBC 3.90 12/14/2015   No results found for: Nils Pyle [facility] Lab Results  Component Value Date    IGGSERUM 720 09/29/2012   IGA 115 09/29/2012   IGMSERUM 73 09/29/2012   Lab Results  Component Value Date   TOTALPROTELP 6.9 09/29/2012   TOTALPROTELP 6.9 09/29/2012   ALBUMINELP 63.4 09/29/2012   A1GS 4.3 09/29/2012   A2GS 12.3* 09/29/2012   BETS 7.2 09/29/2012   BETA2SER 3.6 09/29/2012   GAMS 9.2* 09/29/2012   MSPIKE NOT DET 09/29/2012   SPEI  09/29/2012     Comment:     Nonspecific pattern associated with slightly decreased gamma globulins. Reviewed by Odis Hollingshead, MD, PhD, FCAP (Electronic Signature on File)     Chemistry      Component Value Date/Time   NA 138 07/04/2015 1211   NA 142 01/21/2013 1157   NA 142 11/19/2012 1750   K 4.5 07/04/2015 1211   K 5.3* 01/21/2013 1157   K 4.3 11/19/2012 1750   CL 105 07/04/2015 1211   CL 103 01/21/2013 1157   CL 109* 11/19/2012 1750   CO2 23 07/04/2015 1211   CO2 27 01/21/2013 1157   CO2 24 11/19/2012 1750   BUN 35* 07/04/2015 1211   BUN 32* 01/21/2013 1157   BUN 32* 11/19/2012 1750   CREATININE 1.58* 07/04/2015 1211   CREATININE 1.11* 05/13/2015 0457   CREATININE 1.76* 11/19/2012 1750      Component Value Date/Time   CALCIUM 9.2 07/04/2015 1211   CALCIUM 9.8 01/21/2013 1157   CALCIUM 10.6* 11/19/2012 1750   ALKPHOS 76 05/13/2015 0457   ALKPHOS 67 01/21/2013 1157   ALKPHOS 69 11/19/2012 1750   AST 21 05/13/2015 0457   AST 24 01/21/2013 1157   AST 99* 11/19/2012 1750   ALT 12* 05/13/2015 0457   ALT 18 01/21/2013 1157   ALT 52 11/19/2012 1750   BILITOT 1.3* 05/13/2015 0457   BILITOT 1.10 01/21/2013 1157   BILITOT 1.1* 11/19/2012 1750     Impression and Plan: Lisa James is a very pleasant 82 yo female with immune thrombocytopenia. She continues to do well and is asymptomatic at this time. She stays very active and there have been no episodes of bleeding. No anemia or leukopenia.  Platelet count today is 44 so we will proceed with her Nplate injection today as planned. Pharmacy will adjust dose per her  today's platelet count.  We will plan to see her back in 1 month for labs and follow-up. She will contact us with any questions or concerns. We can certainly see her sooner if need be.    Eliezer Bottom, NP 5/25/20179:05 AM

## 2015-12-14 NOTE — Patient Instructions (Signed)
Romiplostim injection What is this medicine? ROMIPLOSTIM (roe mi PLOE stim) helps your body make more platelets. This medicine is used to treat low platelets caused by chronic idiopathic thrombocytopenic purpura (ITP). This medicine may be used for other purposes; ask your health care provider or pharmacist if you have questions. What should I tell my health care provider before I take this medicine? They need to know if you have any of these conditions: -cancer or myelodysplastic syndrome -low blood counts, like low white cell, platelet, or red cell counts -take medicines that treat or prevent blood clots -an unusual or allergic reaction to romiplostim, mannitol, other medicines, foods, dyes, or preservatives -pregnant or trying to get pregnant -breast-feeding How should I use this medicine? This medicine is for injection under the skin. It is given by a health care professional in a hospital or clinic setting. A special MedGuide will be given to you before your injection. Read this information carefully each time. Talk to your pediatrician regarding the use of this medicine in children. Special care may be needed. Overdosage: If you think you have taken too much of this medicine contact a poison control center or emergency room at once. NOTE: This medicine is only for you. Do not share this medicine with others. What if I miss a dose? It is important not to miss your dose. Call your doctor or health care professional if you are unable to keep an appointment. What may interact with this medicine? Interactions are not expected. This list may not describe all possible interactions. Give your health care provider a list of all the medicines, herbs, non-prescription drugs, or dietary supplements you use. Also tell them if you smoke, drink alcohol, or use illegal drugs. Some items may interact with your medicine. What should I watch for while using this medicine? Your condition will be monitored  carefully while you are receiving this medicine. Visit your prescriber or health care professional for regular checks on your progress and for the needed blood tests. It is important to keep all appointments. What side effects may I notice from receiving this medicine? Side effects that you should report to your doctor or health care professional as soon as possible: -allergic reactions like skin rash, itching or hives, swelling of the face, lips, or tongue -shortness of breath, chest pain, swelling in a leg -unusual bleeding or bruising Side effects that usually do not require medical attention (report to your doctor or health care professional if they continue or are bothersome): -dizziness -headache -muscle aches -pain in arms and legs -stomach pain -trouble sleeping This list may not describe all possible side effects. Call your doctor for medical advice about side effects. You may report side effects to FDA at 1-800-FDA-1088. Where should I keep my medicine? This drug is given in a hospital or clinic and will not be stored at home. NOTE: This sheet is a summary. It may not cover all possible information. If you have questions about this medicine, talk to your doctor, pharmacist, or health care provider.    2016, Elsevier/Gold Standard. (2008-03-07 15:13:04)  

## 2016-01-17 ENCOUNTER — Ambulatory Visit: Payer: Medicare Other

## 2016-01-17 ENCOUNTER — Other Ambulatory Visit: Payer: Medicare Other

## 2016-01-17 ENCOUNTER — Ambulatory Visit: Payer: Medicare Other | Admitting: Family

## 2016-01-18 ENCOUNTER — Ambulatory Visit: Payer: Medicare Other | Admitting: Family

## 2016-01-18 ENCOUNTER — Ambulatory Visit: Payer: Medicare Other

## 2016-01-18 ENCOUNTER — Other Ambulatory Visit: Payer: Medicare Other

## 2016-01-24 ENCOUNTER — Ambulatory Visit: Payer: Medicare Other | Admitting: Family

## 2016-01-24 ENCOUNTER — Other Ambulatory Visit: Payer: Medicare Other

## 2016-01-24 ENCOUNTER — Ambulatory Visit: Payer: Medicare Other

## 2016-01-26 ENCOUNTER — Ambulatory Visit (HOSPITAL_BASED_OUTPATIENT_CLINIC_OR_DEPARTMENT_OTHER): Payer: Medicare Other

## 2016-01-26 ENCOUNTER — Ambulatory Visit (HOSPITAL_BASED_OUTPATIENT_CLINIC_OR_DEPARTMENT_OTHER): Payer: Medicare Other | Admitting: Family

## 2016-01-26 ENCOUNTER — Encounter: Payer: Self-pay | Admitting: Family

## 2016-01-26 ENCOUNTER — Other Ambulatory Visit (HOSPITAL_BASED_OUTPATIENT_CLINIC_OR_DEPARTMENT_OTHER): Payer: Medicare Other

## 2016-01-26 VITALS — BP 131/84 | HR 58 | Temp 97.8°F | Resp 20 | Ht <= 58 in | Wt 114.0 lb

## 2016-01-26 DIAGNOSIS — D693 Immune thrombocytopenic purpura: Secondary | ICD-10-CM

## 2016-01-26 DIAGNOSIS — N183 Chronic kidney disease, stage 3 unspecified: Secondary | ICD-10-CM

## 2016-01-26 LAB — CBC WITH DIFFERENTIAL (CANCER CENTER ONLY)
BASO#: 0 10*3/uL (ref 0.0–0.2)
BASO%: 0.5 % (ref 0.0–2.0)
EOS ABS: 0.3 10*3/uL (ref 0.0–0.5)
EOS%: 7.3 % — AB (ref 0.0–7.0)
HCT: 37 % (ref 34.8–46.6)
HGB: 12.8 g/dL (ref 11.6–15.9)
LYMPH#: 0.8 10*3/uL — ABNORMAL LOW (ref 0.9–3.3)
LYMPH%: 17 % (ref 14.0–48.0)
MCH: 34.3 pg — AB (ref 26.0–34.0)
MCHC: 34.6 g/dL (ref 32.0–36.0)
MCV: 99 fL (ref 81–101)
MONO#: 0.3 10*3/uL (ref 0.1–0.9)
MONO%: 7.7 % (ref 0.0–13.0)
NEUT#: 3 10*3/uL (ref 1.5–6.5)
NEUT%: 67.5 % (ref 39.6–80.0)
RBC: 3.73 10*6/uL (ref 3.70–5.32)
RDW: 12.7 % (ref 11.1–15.7)
WBC: 4.4 10*3/uL (ref 3.9–10.0)

## 2016-01-26 LAB — CHCC SATELLITE - SMEAR

## 2016-01-26 MED ORDER — ROMIPLOSTIM INJECTION 500 MCG
6.0000 ug/kg | Freq: Once | SUBCUTANEOUS | Status: AC
  Administered 2016-01-26: 310 ug via SUBCUTANEOUS
  Filled 2016-01-26: qty 0.62

## 2016-01-26 NOTE — Progress Notes (Signed)
Hematology and Oncology Follow Up Visit  Lisa James [id number] 1927-12-22 82 y.o. 01/26/2016   Principle Diagnosis:  Thrombocytopenia-likely immune thrombocytopenia  Current Therapy:   Nplate as indicated - maintain platelet count > than 70,000    Interim History: Lisa James is here today with her health care worker for a follow-up. She is a little fatigued today. She had an episode diarrhea once this morning and had to re shower before coming in for her appointment today. She has had no other episodes and denies abdominal pain or upset.  Her platlet count at this time is 35. She is asymptomatic with this and has had no episodes of bleeding, bruising or petechiae.  No lymphadenopathy found on exam.  No fatigue, fever, chills, n/v, cough, rash, headache, dizziness, SOB, chest pain, palpitations, abdominal pain or changes in her bowel or bladder habits.  No swelling, tenderness, numbness or tingling. No c/o of joint aches or bone pain. No falls or syncopal episodes.  She is eating well and is staying hydrated. Her weight is unchanged.  She continues to stay active walking around her assisted living facility daily.   Medications:    Medication List       This list is accurate as of: 01/26/16 12:16 PM.  Always use your most recent med list.               atorvastatin 20 MG tablet  Commonly known as:  LIPITOR  Take 1 tablet (20 mg total) by mouth daily.     calcium-vitamin D 500-200 MG-UNIT tablet  Take 1 tablet by mouth daily.     carvedilol 6.25 MG tablet  Commonly known as:  COREG  Take 1 tablet (6.25 mg total) by mouth 2 (two) times daily with a meal.     furosemide 20 MG tablet  Commonly known as:  LASIX  TAKE 1 TABLET (20 MG TOTAL) BY MOUTH DAILY.     irbesartan 300 MG tablet  Commonly known as:  AVAPRO  Take 1 tablet (300 mg total) by mouth daily.     KLOR-CON M20 20 MEQ tablet  Generic drug:  potassium chloride SA  TAKE 1 TABLET EVERY DAY     mupirocin  ointment 2 %  Commonly known as:  BACTROBAN  Apply small amount to surgical area left ear twice a day     PRESERVISION AREDS PO  Take 1 tablet by mouth daily.     romiPLOStim 250 MCG injection  Commonly known as:  NPLATE  Inject 1 mcg/kg into the skin as needed (every 2-3 weeks for low platelets). Given @ Oncology     sertraline 25 MG tablet  Commonly known as:  ZOLOFT  TAKE 1 TABLET BY MOUTH EVERY DAY     spironolactone 25 MG tablet  Commonly known as:  ALDACTONE  Take 25 mg by mouth daily.     Vitamin D 1000 units capsule  Take 1,000 Units by mouth at bedtime.        Allergies:  Allergies  Allergen Reactions  . Ace Inhibitors Cough    Past Medical History, Surgical history, Social history, and Family History were reviewed and updated.  Review of Systems: All other 10 point review of systems is negative.   Physical Exam:  height is 4\' 10"  (1.473 m) and weight is 114 lb (51.71 kg). Her oral temperature is 97.8 F (36.6 C). Her blood pressure is 131/84 and her pulse is 58. Her respiration is 20.   Wt Readings  from Last 3 Encounters:  01/26/16 114 lb (51.71 kg)  12/14/15 114 lb (51.71 kg)  11/10/15 115 lb (52.164 kg)    Ocular: Sclerae unicteric, pupils equal, round and reactive to light Ear-nose-throat: Oropharynx clear, dentition fair Lymphatic: No cervical supraclavicular or axillary adenopathy Lungs no rales or rhonchi, good excursion bilaterally Heart regular rate and rhythm, no murmur appreciated Abd soft, nontender, positive bowel sounds, no liver or spleen tip palpated on exam, no fluid wave  MSK no focal spinal tenderness, no joint edema Neuro: non-focal, well-oriented, appropriate affect Breasts: Deferred  Lab Results  Component Value Date   WBC 6.3 12/14/2015   HGB 13.3 12/14/2015   HCT 37.7 12/14/2015   MCV 97 12/14/2015   PLT 44 Platelet count consistent in citrate* 12/14/2015   No results found for: FERRITIN, IRON, TIBC, UIBC,  IRONPCTSAT Lab Results  Component Value Date   RETICCTPCT 2.6 05/13/2011   RBC 3.90 12/14/2015   No results found for: Nils Pyle [facility] Lab Results  Component Value Date   IGGSERUM 720 09/29/2012   IGA 115 09/29/2012   IGMSERUM 73 09/29/2012   Lab Results  Component Value Date   TOTALPROTELP 6.9 09/29/2012   TOTALPROTELP 6.9 09/29/2012   ALBUMINELP 63.4 09/29/2012   A1GS 4.3 09/29/2012   A2GS 12.3* 09/29/2012   BETS 7.2 09/29/2012   BETA2SER 3.6 09/29/2012   GAMS 9.2* 09/29/2012   MSPIKE NOT DET 09/29/2012   SPEI  09/29/2012     Comment:     Nonspecific pattern associated with slightly decreased gamma globulins. Reviewed by Odis Hollingshead, MD, PhD, FCAP (Electronic Signature on File)     Chemistry      Component Value Date/Time   NA 138 07/04/2015 1211   NA 142 01/21/2013 1157   NA 142 11/19/2012 1750   K 4.5 07/04/2015 1211   K 5.3* 01/21/2013 1157   K 4.3 11/19/2012 1750   CL 105 07/04/2015 1211   CL 103 01/21/2013 1157   CL 109* 11/19/2012 1750   CO2 23 07/04/2015 1211   CO2 27 01/21/2013 1157   CO2 24 11/19/2012 1750   BUN 35* 07/04/2015 1211   BUN 32* 01/21/2013 1157   BUN 32* 11/19/2012 1750   CREATININE 1.58* 07/04/2015 1211   CREATININE 1.11* 05/13/2015 0457   CREATININE 1.76* 11/19/2012 1750      Component Value Date/Time   CALCIUM 9.2 07/04/2015 1211   CALCIUM 9.8 01/21/2013 1157   CALCIUM 10.6* 11/19/2012 1750   ALKPHOS 76 05/13/2015 0457   ALKPHOS 67 01/21/2013 1157   ALKPHOS 69 11/19/2012 1750   AST 21 05/13/2015 0457   AST 24 01/21/2013 1157   AST 99* 11/19/2012 1750   ALT 12* 05/13/2015 0457   ALT 18 01/21/2013 1157   ALT 52 11/19/2012 1750   BILITOT 1.3* 05/13/2015 0457   BILITOT 1.10 01/21/2013 1157   BILITOT 1.1* 11/19/2012 1750     Impression and Plan: Lisa James is a very pleasant 82 yo female with immune thrombocytopenia. She is doing well but did have an episode of diarrhea this morning and is  feeling a little fatigued.  Platelet count today is 35 so we will proceed with her Nplate injection today as planned. Pharmacy will adjust dose per her today's platelet count.  We will plan to see her back in 1 month for labs and follow-up. She will contact us with any questions or concerns. We can certainly see her sooner if need be.    Matteson Blue  M, NP 7/7/201712:16 PM

## 2016-01-26 NOTE — Patient Instructions (Signed)
Romiplostim injection What is this medicine? ROMIPLOSTIM (roe mi PLOE stim) helps your body make more platelets. This medicine is used to treat low platelets caused by chronic idiopathic thrombocytopenic purpura (ITP). This medicine may be used for other purposes; ask your health care provider or pharmacist if you have questions. What should I tell my health care provider before I take this medicine? They need to know if you have any of these conditions: -cancer or myelodysplastic syndrome -low blood counts, like low white cell, platelet, or red cell counts -take medicines that treat or prevent blood clots -an unusual or allergic reaction to romiplostim, mannitol, other medicines, foods, dyes, or preservatives -pregnant or trying to get pregnant -breast-feeding How should I use this medicine? This medicine is for injection under the skin. It is given by a health care professional in a hospital or clinic setting. A special MedGuide will be given to you before your injection. Read this information carefully each time. Talk to your pediatrician regarding the use of this medicine in children. Special care may be needed. Overdosage: If you think you have taken too much of this medicine contact a poison control center or emergency room at once. NOTE: This medicine is only for you. Do not share this medicine with others. What if I miss a dose? It is important not to miss your dose. Call your doctor or health care professional if you are unable to keep an appointment. What may interact with this medicine? Interactions are not expected. This list may not describe all possible interactions. Give your health care provider a list of all the medicines, herbs, non-prescription drugs, or dietary supplements you use. Also tell them if you smoke, drink alcohol, or use illegal drugs. Some items may interact with your medicine. What should I watch for while using this medicine? Your condition will be monitored  carefully while you are receiving this medicine. Visit your prescriber or health care professional for regular checks on your progress and for the needed blood tests. It is important to keep all appointments. What side effects may I notice from receiving this medicine? Side effects that you should report to your doctor or health care professional as soon as possible: -allergic reactions like skin rash, itching or hives, swelling of the face, lips, or tongue -shortness of breath, chest pain, swelling in a leg -unusual bleeding or bruising Side effects that usually do not require medical attention (report to your doctor or health care professional if they continue or are bothersome): -dizziness -headache -muscle aches -pain in arms and legs -stomach pain -trouble sleeping This list may not describe all possible side effects. Call your doctor for medical advice about side effects. You may report side effects to FDA at 1-800-FDA-1088. Where should I keep my medicine? This drug is given in a hospital or clinic and will not be stored at home. NOTE: This sheet is a summary. It may not cover all possible information. If you have questions about this medicine, talk to your doctor, pharmacist, or health care provider.    2016, Elsevier/Gold Standard. (2008-03-07 15:13:04)  

## 2016-01-28 ENCOUNTER — Other Ambulatory Visit: Payer: Self-pay | Admitting: Emergency Medicine

## 2016-02-25 ENCOUNTER — Other Ambulatory Visit: Payer: Self-pay | Admitting: Emergency Medicine

## 2016-02-28 ENCOUNTER — Ambulatory Visit: Payer: Medicare Other

## 2016-02-28 ENCOUNTER — Ambulatory Visit: Payer: Medicare Other | Admitting: Hematology & Oncology

## 2016-02-28 ENCOUNTER — Other Ambulatory Visit: Payer: Medicare Other

## 2016-04-10 ENCOUNTER — Ambulatory Visit (HOSPITAL_BASED_OUTPATIENT_CLINIC_OR_DEPARTMENT_OTHER): Payer: Medicare Other

## 2016-04-10 ENCOUNTER — Encounter: Payer: Self-pay | Admitting: Hematology & Oncology

## 2016-04-10 ENCOUNTER — Other Ambulatory Visit (HOSPITAL_BASED_OUTPATIENT_CLINIC_OR_DEPARTMENT_OTHER): Payer: Medicare Other

## 2016-04-10 ENCOUNTER — Ambulatory Visit (HOSPITAL_BASED_OUTPATIENT_CLINIC_OR_DEPARTMENT_OTHER): Payer: Medicare Other | Admitting: Hematology & Oncology

## 2016-04-10 VITALS — BP 162/54 | HR 60 | Temp 98.0°F | Resp 16 | Ht <= 58 in | Wt 112.0 lb

## 2016-04-10 DIAGNOSIS — D693 Immune thrombocytopenic purpura: Secondary | ICD-10-CM

## 2016-04-10 LAB — CBC WITH DIFFERENTIAL (CANCER CENTER ONLY)
BASO#: 0 10*3/uL (ref 0.0–0.2)
BASO%: 0.4 % (ref 0.0–2.0)
EOS ABS: 0.4 10*3/uL (ref 0.0–0.5)
EOS%: 7.8 % — ABNORMAL HIGH (ref 0.0–7.0)
HEMATOCRIT: 36.9 % (ref 34.8–46.6)
HEMOGLOBIN: 13.1 g/dL (ref 11.6–15.9)
LYMPH#: 1.1 10*3/uL (ref 0.9–3.3)
LYMPH%: 19.6 % (ref 14.0–48.0)
MCH: 34.7 pg — AB (ref 26.0–34.0)
MCHC: 35.5 g/dL (ref 32.0–36.0)
MCV: 98 fL (ref 81–101)
MONO#: 0.6 10*3/uL (ref 0.1–0.9)
MONO%: 10.2 % (ref 0.0–13.0)
NEUT#: 3.4 10*3/uL (ref 1.5–6.5)
NEUT%: 62 % (ref 39.6–80.0)
Platelets: 49 10*3/uL — ABNORMAL LOW (ref 145–400)
RBC: 3.77 10*6/uL (ref 3.70–5.32)
RDW: 12.7 % (ref 11.1–15.7)
WBC: 5.5 10*3/uL (ref 3.9–10.0)

## 2016-04-10 LAB — CHCC SATELLITE - SMEAR

## 2016-04-10 MED ORDER — ROMIPLOSTIM INJECTION 500 MCG
6.1000 ug/kg | Freq: Once | SUBCUTANEOUS | Status: AC
  Administered 2016-04-10: 310 ug via SUBCUTANEOUS
  Filled 2016-04-10: qty 0.62

## 2016-04-10 NOTE — Progress Notes (Signed)
Hematology and Oncology Follow Up Visit  Lisa James [id number] 28-Mar-1928 82 y.o. 04/10/2016   Principle Diagnosis:   Thrombocytopenia-likely immune thrombocytopenia  Current Therapy:   Nplate as indicated- maintain platelet count > than 70,000     Interim History:  Lisa James is back for followup. She looks well. She feels well. She is pretty active at the nursing home. She is not eating all that much according to her caretaker. I'm not sure why she is not eating much. She said that she does not have much of an appetite. This might be a function of her age.  I told her that if she does not eat, her platelet count will get worse. Also, her blood pressure may get higher because this will stress her heart.  She will go ahead and go out to breakfast this morning. Her caretaker will make sure that she has something to eat.  She's had no bleeding.  She's had no cough. She's had no fever. She had no obvious infections.  Overall, her performance status is ECOG 3. Medications:  Current Outpatient Prescriptions:  .  atorvastatin (LIPITOR) 20 MG tablet, TAKE 1 TABLET (20 MG TOTAL) BY MOUTH DAILY., Disp: 30 tablet, Rfl: 0 .  Calcium Carbonate-Vitamin D (CALCIUM-VITAMIN D) 500-200 MG-UNIT per tablet, Take 1 tablet by mouth daily. , Disp: , Rfl:  .  carvedilol (COREG) 6.25 MG tablet, Take 1 tablet (6.25 mg total) by mouth 2 (two) times daily with a meal., Disp: 180 tablet, Rfl: 1 .  Cholecalciferol (VITAMIN D) 1000 UNITS capsule, Take 1,000 Units by mouth at bedtime. , Disp: , Rfl:  .  furosemide (LASIX) 20 MG tablet, TAKE 1 TABLET (20 MG TOTAL) BY MOUTH DAILY., Disp: 90 tablet, Rfl: 1 .  irbesartan (AVAPRO) 300 MG tablet, Take 1 tablet (300 mg total) by mouth daily., Disp: 30 tablet, Rfl: 3 .  KLOR-CON M20 20 MEQ tablet, TAKE 1 TABLET EVERY DAY, Disp: 90 tablet, Rfl: 3 .  Multiple Vitamins-Minerals (PRESERVISION AREDS PO), Take 1 tablet by mouth daily., Disp: , Rfl:  .  mupirocin  ointment (BACTROBAN) 2 %, Apply small amount to surgical area left ear twice a day, Disp: 22 g, Rfl: 0 .  romiPLOStim (NPLATE) 250 MCG injection, Inject 1 mcg/kg into the skin as needed (every 2-3 weeks for low platelets). Given @ Oncology, Disp: , Rfl:  .  sertraline (ZOLOFT) 25 MG tablet, TAKE 1 TABLET BY MOUTH EVERY DAY, Disp: 90 tablet, Rfl: 0 .  spironolactone (ALDACTONE) 25 MG tablet, Take 25 mg by mouth daily., Disp: , Rfl:  No current facility-administered medications for this visit.   Facility-Administered Medications Ordered in Other Visits:  .  romiPLOStim (NPLATE) injection 100 mcg, 2 mcg/kg, Subcutaneous, Once, Volanda Napoleon, MD  Allergies:  Allergies  Allergen Reactions  . Ace Inhibitors Cough    Past Medical History, Surgical history, Social history, and Family History were reviewed and updated.  Review of Systems: As above  Physical Exam:  height is 4\' 10"  (1.473 m) and weight is 112 lb (50.8 kg). Her oral temperature is 98 F (36.7 C). Her blood pressure is 162/54 (abnormal) and her pulse is 60. Her respiration is 16.   Totally, petite white female in no obvious distress. Head and neck exam shows no ocular or oral lesions. Pupils reacted properly. Extraocular muscles are intact. There is no adenopathy in the neck. Lungs are clear. Cardiac exam is regular and rhythm . She has a 1/6 systolic ejection  murmur. Abdomen is soft. She has good bowel sounds. There is no fluid wave. There is no palpable liver or spleen tip. Back exam shows no tenderness over the spine. She has some slight kyphosis. Extremities shows no clubbing cyanosis or edema. She has age related osteoarthritic changes. She has good strength bilaterally. Neurological exam is without any deficits. Skin exam shows no rashes, ecchymoses or petechia.  Lab Results  Component Value Date   WBC 5.5 04/10/2016   HGB 13.1 04/10/2016   HCT 36.9 04/10/2016   MCV 98 04/10/2016   PLT 49 (L) 04/10/2016     Chemistry       Component Value Date/Time   NA 138 07/04/2015 1211   NA 142 01/21/2013 1157   K 4.5 07/04/2015 1211   K 5.3 (H) 01/21/2013 1157   CL 105 07/04/2015 1211   CL 103 01/21/2013 1157   CO2 23 07/04/2015 1211   CO2 27 01/21/2013 1157   BUN 35 (H) 07/04/2015 1211   BUN 32 (H) 01/21/2013 1157   CREATININE 1.58 (H) 07/04/2015 1211      Component Value Date/Time   CALCIUM 9.2 07/04/2015 1211   CALCIUM 9.8 01/21/2013 1157   ALKPHOS 76 05/13/2015 0457   ALKPHOS 67 01/21/2013 1157   AST 21 05/13/2015 0457   AST 24 01/21/2013 1157   ALT 12 (L) 05/13/2015 0457   ALT 18 01/21/2013 1157   BILITOT 1.3 (H) 05/13/2015 0457   BILITOT 1.10 01/21/2013 1157         Impression and Plan: Lisa James is a 82 year old female with thrombocytopenia. This is likely immune thrombocytopenia. I actually did do a bone marrow on her a couple of years ago..  She responds well to Nplate. We will go ahead and give her Nplate today.  I want to get her back in 4 weeks. We will see what her platelet count is at that point.     Volanda Napoleon, MD 9/20/201710:09 AM

## 2016-04-10 NOTE — Patient Instructions (Signed)
Romiplostim injection What is this medicine? ROMIPLOSTIM (roe mi PLOE stim) helps your body make more platelets. This medicine is used to treat low platelets caused by chronic idiopathic thrombocytopenic purpura (ITP). This medicine may be used for other purposes; ask your health care provider or pharmacist if you have questions. What should I tell my health care provider before I take this medicine? They need to know if you have any of these conditions: -cancer or myelodysplastic syndrome -low blood counts, like low white cell, platelet, or red cell counts -take medicines that treat or prevent blood clots -an unusual or allergic reaction to romiplostim, mannitol, other medicines, foods, dyes, or preservatives -pregnant or trying to get pregnant -breast-feeding How should I use this medicine? This medicine is for injection under the skin. It is given by a health care professional in a hospital or clinic setting. A special MedGuide will be given to you before your injection. Read this information carefully each time. Talk to your pediatrician regarding the use of this medicine in children. Special care may be needed. Overdosage: If you think you have taken too much of this medicine contact a poison control center or emergency room at once. NOTE: This medicine is only for you. Do not share this medicine with others. What if I miss a dose? It is important not to miss your dose. Call your doctor or health care professional if you are unable to keep an appointment. What may interact with this medicine? Interactions are not expected. This list may not describe all possible interactions. Give your health care provider a list of all the medicines, herbs, non-prescription drugs, or dietary supplements you use. Also tell them if you smoke, drink alcohol, or use illegal drugs. Some items may interact with your medicine. What should I watch for while using this medicine? Your condition will be monitored  carefully while you are receiving this medicine. Visit your prescriber or health care professional for regular checks on your progress and for the needed blood tests. It is important to keep all appointments. What side effects may I notice from receiving this medicine? Side effects that you should report to your doctor or health care professional as soon as possible: -allergic reactions like skin rash, itching or hives, swelling of the face, lips, or tongue -shortness of breath, chest pain, swelling in a leg -unusual bleeding or bruising Side effects that usually do not require medical attention (report to your doctor or health care professional if they continue or are bothersome): -dizziness -headache -muscle aches -pain in arms and legs -stomach pain -trouble sleeping This list may not describe all possible side effects. Call your doctor for medical advice about side effects. You may report side effects to FDA at 1-800-FDA-1088. Where should I keep my medicine? This drug is given in a hospital or clinic and will not be stored at home. NOTE: This sheet is a summary. It may not cover all possible information. If you have questions about this medicine, talk to your doctor, pharmacist, or health care provider.    2016, Elsevier/Gold Standard. (2008-03-07 15:13:04)  

## 2016-04-15 ENCOUNTER — Encounter: Payer: Self-pay | Admitting: Physician Assistant

## 2016-04-15 ENCOUNTER — Ambulatory Visit (INDEPENDENT_AMBULATORY_CARE_PROVIDER_SITE_OTHER): Payer: Medicare Other | Admitting: Physician Assistant

## 2016-04-15 VITALS — BP 144/70 | HR 61 | Ht <= 58 in | Wt 114.6 lb

## 2016-04-15 DIAGNOSIS — S065X9A Traumatic subdural hemorrhage with loss of consciousness of unspecified duration, initial encounter: Secondary | ICD-10-CM

## 2016-04-15 DIAGNOSIS — S065XAA Traumatic subdural hemorrhage with loss of consciousness status unknown, initial encounter: Secondary | ICD-10-CM

## 2016-04-15 DIAGNOSIS — I10 Essential (primary) hypertension: Secondary | ICD-10-CM

## 2016-04-15 DIAGNOSIS — E785 Hyperlipidemia, unspecified: Secondary | ICD-10-CM

## 2016-04-15 DIAGNOSIS — D693 Immune thrombocytopenic purpura: Secondary | ICD-10-CM | POA: Diagnosis not present

## 2016-04-15 DIAGNOSIS — I25810 Atherosclerosis of coronary artery bypass graft(s) without angina pectoris: Secondary | ICD-10-CM

## 2016-04-15 DIAGNOSIS — I6200 Nontraumatic subdural hemorrhage, unspecified: Secondary | ICD-10-CM

## 2016-04-15 DIAGNOSIS — R011 Cardiac murmur, unspecified: Secondary | ICD-10-CM

## 2016-04-15 LAB — COMPREHENSIVE METABOLIC PANEL
ALT: 12 U/L (ref 6–29)
AST: 21 U/L (ref 10–35)
Albumin: 4 g/dL (ref 3.6–5.1)
Alkaline Phosphatase: 71 U/L (ref 33–130)
BUN: 28 mg/dL — AB (ref 7–25)
CHLORIDE: 104 mmol/L (ref 98–110)
CO2: 27 mmol/L (ref 20–31)
Calcium: 9.8 mg/dL (ref 8.6–10.4)
Creat: 1.3 mg/dL — ABNORMAL HIGH (ref 0.60–0.88)
GLUCOSE: 115 mg/dL — AB (ref 65–99)
POTASSIUM: 4.4 mmol/L (ref 3.5–5.3)
Sodium: 140 mmol/L (ref 135–146)
TOTAL PROTEIN: 5.9 g/dL — AB (ref 6.1–8.1)
Total Bilirubin: 1.1 mg/dL (ref 0.2–1.2)

## 2016-04-15 LAB — CBC WITH DIFFERENTIAL/PLATELET
BASOS ABS: 0 {cells}/uL (ref 0–200)
Basophils Relative: 0 %
EOS ABS: 392 {cells}/uL (ref 15–500)
EOS PCT: 7 %
HCT: 38.6 % (ref 35.0–45.0)
Hemoglobin: 12.8 g/dL (ref 11.7–15.5)
LYMPHS PCT: 20 %
Lymphs Abs: 1120 cells/uL (ref 850–3900)
MCH: 33.1 pg — AB (ref 27.0–33.0)
MCHC: 33.2 g/dL (ref 32.0–36.0)
MCV: 99.7 fL (ref 80.0–100.0)
MONOS PCT: 9 %
MPV: 11.8 fL (ref 7.5–12.5)
Monocytes Absolute: 504 cells/uL (ref 200–950)
NEUTROS PCT: 64 %
Neutro Abs: 3584 cells/uL (ref 1500–7800)
PLATELETS: 79 10*3/uL — AB (ref 140–400)
RBC: 3.87 MIL/uL (ref 3.80–5.10)
RDW: 13.7 % (ref 11.0–15.0)
WBC: 5.6 10*3/uL (ref 3.8–10.8)

## 2016-04-15 NOTE — Patient Instructions (Signed)
Medication Instructions:  Your physician recommends that you continue on your current medications as directed. Please refer to the Current Medication list given to you today.   Labwork: Your physician recommends that you return for lab work in: TODAY  The lab can be found on the FIRST FLOOR of out building in [address]   Testing/Procedures: Your physician has requested that you have an echocardiogram. Echocardiography is a painless test that uses sound waves to create images of your heart. It provides your doctor with information about the size and shape of your heart and how well your heart's chambers and valves are working. This procedure takes approximately one hour. There are no restrictions for this procedure.   Follow-Up: Your physician recommends that you schedule a follow-up appointment in: 2 months with Dr. Claiborne Billings.   Any Other Special Instructions Will Be Listed Below (If Applicable).   .I will get your lab work from your Primary Care Physician.     If you need a refill on your cardiac medications before your next appointment, please call your pharmacy.

## 2016-04-15 NOTE — Progress Notes (Signed)
Cardiology Office Note    Date:  04/15/2016   ID:  Lisa James, DOB 1928/05/18, MRN [id number]  PCP:  Merrilee Seashore, MD  Cardiologist:  Dr. Claiborne Billings (established with Dr. Claiborne Billings in 2014, previously seen by Dr. Verl Blalock)  Chief Complaint  Patient presents with  . Follow-up    seen for Dr. Claiborne Billings, last visit Jan 2016.    History of Present Illness:  Lisa James is a 82 y.o. female with PMH of HTN, HLD, and CAD s/p emergent 5v CABG by Dr. Servando Snare 09/2007. She also had a history of subdural hematoma since at least 2012 and a history of immune thrombocytopenic purpura (ITP) followed by Dr. Vergia Alcon and Dr. Marin Olp and has been treated with Nplate therapy. Echocardiogram obtained in 2010 showed ejection fraction of 40-45% with evidence for mild aortic stenosis. Her last Myoview obtained on 05/06/2013 shows EF 60%, normal nuclear study. Echocardiogram obtained on 09/08/2014 showed EF 60-65%, mild LVH, grade 1 diastolic dysfunction, no regional wall motion abnormality, moderate AS, moderate MR, moderate TR. Her most recent CT of the head obtained in January 2017 showed stable chronic calcified left subdural hematoma unchanged with 6 mm midline shift.  Her last follow-up was in 08/02/2014 with Dr. Claiborne Billings, at which time she was doing well. She denied any chest discomfort. She was approaching stage IV CKD. 3 months outpatient follow-up was recommended. It does not appear patient has followed up since. She was last seen by Dr. Marin Olp on 04/10/2016, she was doing well at the time. Her platelet has remained in the 30,000-50,000 range.  Patient is accompanied by her care provider today to cardiology visit. According to her care provider, she has switched primary care physician to Dr. Ashby Dawes. Last week, she was coming out of Dr. Antonieta Pert visit, she had a spontaneous fall to the ground. She denies any mechanical component to it. She does not remember much of the fall, she cannot tell me if it is  due to leg weakness or dizziness. She denies any recent severe dizziness, headache, or altered mental status. Afterward, she told her care provider that she has pain between her shoulder blade in the back, however denies any chest pain. She has no recent lower extremity edema, orthopnea or PND. According to the patient, she has good appetite, however according to the care provider, she has not been eating very well. Patient also says she walks around the independent living facility [facility]) on daily basis, and has not had any exertional chest discomfort. As far as the back discomfort, she says that occurred at rest. As far as her mental status, she is unable to recall the year, she is unable to recall the name of the street where she lives. She does however know who is Software engineer of the [place] and states that she does catch up with the current event.   Past Medical History:  Diagnosis Date  . Acute MI, lateral wall ([place])   . Aortic stenosis   . Chronic kidney disease   . Chronic systolic heart failure ([place])   . Coronary artery disease    sees Dr Claiborne Billings every 6 months  . DM (diabetes mellitus) ([place])    type 2 ; diet controlled  . HTN (hypertension)   . Immune thrombocytopenic purpura ([place]) 01/21/2013  . Ischemic heart disease   . Mitral regurgitation   . 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 Subdural hematoma, post-traumatic [facility]) 2015   sees Dr Sherwood Gambler  every 6 months  . Thrombocytopenia ([place])    sees Dr Marin Olp every 2 weeks ;receives Nplate prn    Past Surgical History:  Procedure Laterality Date  . BONE MARROW BIOPSY  11/22/2002   left posterior iliac crest bone marrow biopsy and aspirate  . CATARACT EXTRACTION W/ INTRAOCULAR LENS  IMPLANT, BILATERAL Bilateral   . CORONARY ARTERY BYPASS GRAFT  11/20/2007   x5  . EYE SURGERY    . submental lymph node excisional biopsy  10/22/2002    Current Medications: Outpatient Medications Prior to Visit  Medication Sig  Dispense Refill  . atorvastatin (LIPITOR) 20 MG tablet TAKE 1 TABLET (20 MG TOTAL) BY MOUTH DAILY. 30 tablet 0  . Calcium Carbonate-Vitamin D (CALCIUM-VITAMIN D) 500-200 MG-UNIT per tablet Take 1 tablet by mouth daily.     . carvedilol (COREG) 6.25 MG tablet Take 1 tablet (6.25 mg total) by mouth 2 (two) times daily with a meal. 180 tablet 1  . Cholecalciferol (VITAMIN D) 1000 UNITS capsule Take 1,000 Units by mouth at bedtime.     . furosemide (LASIX) 20 MG tablet TAKE 1 TABLET (20 MG TOTAL) BY MOUTH DAILY. 90 tablet 1  . irbesartan (AVAPRO) 300 MG tablet Take 1 tablet (300 mg total) by mouth daily. 30 tablet 3  . KLOR-CON M20 20 MEQ tablet TAKE 1 TABLET EVERY DAY 90 tablet 3  . Multiple Vitamins-Minerals (PRESERVISION AREDS PO) Take 1 tablet by mouth daily.    . mupirocin ointment (BACTROBAN) 2 % Apply small amount to surgical area left ear twice a day 22 g 0  . romiPLOStim (NPLATE) 250 MCG injection Inject 1 mcg/kg into the skin as needed (every 2-3 weeks for low platelets). Given @ Oncology    . sertraline (ZOLOFT) 25 MG tablet TAKE 1 TABLET BY MOUTH EVERY DAY 90 tablet 0  . spironolactone (ALDACTONE) 25 MG tablet Take 25 mg by mouth daily.     No facility-administered medications prior to visit.      Allergies:   Ace inhibitors   Social History   Social History  . Marital status: Widowed    Spouse name: N/A  . Number of children: N/A  . Years of education: N/A   Social History Main Topics  . Smoking status: Never Smoker  . Smokeless tobacco: Never Used     Comment: never used tobacco.  . Alcohol use No  . Drug use: No  . Sexual activity: No   Other Topics Concern  . None   Social History Narrative  . None     Family History:  The patient's family history includes Diabetes in her father; Heart attack in her father.   ROS:   Please see the history of present illness.    ROS All other systems reviewed and are negative.   PHYSICAL EXAM:   VS:  BP (!) 144/70   Pulse  61   Ht 4' 10" (1.473 m)   Wt 114 lb 9.6 oz (52 kg)   LMP  (LMP Unknown)   BMI 23.95 kg/m    GEN: Well nourished, well developed, in no acute distress  HEENT: normal  Neck: no JVD, carotid bruits, or masses Cardiac: RRR; no rubs, or gallops,no edema  3/6 systolic murmur Respiratory:  clear to auscultation bilaterally, normal work of breathing GI: soft, nontender, nondistended, + BS MS: no deformity or atrophy  Skin: warm and dry, no rash Neuro:  Alert and Oriented x 3, Strength and sensation are intact Psych: euthymic mood,  full affect  Wt Readings from Last 3 Encounters:  04/15/16 114 lb 9.6 oz (52 kg)  04/10/16 112 lb (50.8 kg)  01/26/16 114 lb (51.7 kg)      Studies/Labs Reviewed:   EKG:  EKG is ordered today.  The ekg ordered today demonstrates NSR with LVH with TWI in lateral leads unchanged compared to previous EKG in 2016  Recent Labs: 05/13/2015: ALT 12; Magnesium 1.8; TSH 4.524 07/04/2015: BUN 35; Creat 1.58; Potassium 4.5; Sodium 138 04/10/2016: HGB 13.1; Platelets 49   Lipid Panel    Component Value Date/Time   CHOL 117 09/08/2014 1038   TRIG 224 (H) 09/08/2014 1038   HDL 25 (L) 09/08/2014 1038   CHOLHDL 4.7 09/08/2014 1038   VLDL 45 (H) 09/08/2014 1038   LDLCALC 47 09/08/2014 1038   LDLDIRECT 64.4 06/20/2009 0000    Additional studies/ records that were reviewed today include:   Myoview 05/06/2013 Impression Exercise Capacity:  Lexiscan with no exercise. BP Response:  Normal blood pressure response. Clinical Symptoms:  There is dyspnea. ECG Impression:  No significant ECG changes with Lexiscan. Comparison with Prior Nuclear Study: No previous nuclear study performed  Overall Impression:  Normal stress nuclear study.  LV Wall Motion:  NL LV Function; NL Wall Motion; EF 60%   Echo 09/08/2014 LV EF: 60% -  65%  ------------------------------------------------------------------- Indications:   414.01 Coronary atherosclerosis - native  artery.  ------------------------------------------------------------------- History:  PMH: Hyperlipidemia. Coronary artery disease. PMH: Myocardial infarction. Risk factors: Hypertension. Diabetes mellitus.  ------------------------------------------------------------------- Study Conclusions  - Left ventricle: The cavity size was normal. Wall thickness was increased in a pattern of mild LVH. Systolic function was normal. The estimated ejection fraction was in the range of 60% to 65%. Wall motion was normal; there were no regional wall motion abnormalities. Doppler parameters are consistent with abnormal left ventricular relaxation (grade 1 diastolic dysfunction). Doppler parameters are consistent with high ventricular filling pressure. - Aortic valve: Cusp separation was reduced. There was mild stenosis. There was trivial regurgitation. - Mitral valve: Severely calcified annulus. There was moderate regurgitation. - Left atrium: The atrium was moderately dilated. - Tricuspid valve: There was moderate regurgitation.  Impressions:  - Normal LV function; grade 1 diastolic dysfunction; moderate LAE; severe MAC with moderate MR; calcified aortic valve with mild AS and trivial AI; moderate TR.   CT  Of head 05/11/2011 IMPRESSION:  1.  Small acute right frontal subdural hematoma superimposed on bilateral chronic subdural hematomas.  CT of head 02/28/2012 IMPRESSION: 1.  Stable size of the mixed density left subdural collection with 4 mm of rightward midline shift.  Interval decreased hyperdense blood products. 2.  Stable small right extra-axial low density collection. 3.  No new intracranial abnormality.   CT of head 03/22/2014 IMPRESSION: Large left convexity extra-axial complex collection with maximal thickness of 2.9 cm causing mass effect upon the left lateral ventricle with 6.9 mm of midline shift to the right appears similar to prior exam  consistent with chronic subdural hematoma.  Very small chronic right-sided subdural collection/dural thickening stable.  No new intracranial hemorrhage is detected.     CT of head 01/31/2015 IMPRESSION: Stable large calcifying chronic left-sided subdural hematoma. Maximal thickness 2.9 cm. This contributes to midline shift to the right by 5.9 mm which is without change.  No significant change tiny right subdural collection.     CT of head 08/22/2015 FINDINGS: Large high density subdural fluid collection is unchanged. This measures 27 mm in the frontal region at  approximately 28 mm over the convexity. This is likely a chronic calcifying subdural hematoma. Small area of high density in the left frontal subdural space anteriorly is unchanged. There is mass-effect on the left cerebral hemisphere with 6 mm midline shift to the right which is unchanged.  No areas of acute hemorrhage or infarction noted. Moderate atrophy. Mild chronic microvascular ischemic change. Negative for hydrocephalus.  The calvarium is intact.  IMPRESSION: Chronic calcified left subdural hematoma unchanged with 6 mm midline shift. No new findings since prior imaging  ASSESSMENT:    1. Coronary artery disease involving coronary bypass graft of native heart without angina pectoris   2. Essential hypertension   3. Hyperlipidemia   4. ITP (idiopathic thrombocytopenic purpura)   5. Subdural hematoma (HCC)   6. Murmur, cardiac      PLAN:  In order of problems listed above:  1. Shoulder blade pain: Rest, does not appear to be associated with exertion. Given her advanced age and poor mental status, would avoid any aggressive workup. Echocardiogram repeated to assess her valvular function, if ejection fraction is normal, would not pursue any further workup. Obtain CBC. Request recent office visit from PCP  2. Moderate MR/TR: Last echocardiogram February 3825, 3/6 systolic heart murmur on physical  exam. We'll repeat echocardiogram  3. Chronic left subdural hematoma: Stable on repeat imaging in 07/2015, we will defer to PCP for further monitoring. Does appears to have mass effect shift on the recent CT of the head which is unchanged when compared to the previous image. She does not have any motor mental status, severe headache, or severe dizziness to suggest progression.  4. CAD s/p emergent 5v CABG by Dr. Servando Snare 09/2007: Does not appear to have any obvious angina.  5. HTN: Blood pressure controlled on current therapy. Continue irbesartan, carvedilol, spironolactone. Given recent fall, and unclear etiology, would not try to lower her pressure too low. Blood pressure goal should be around 053 to 976B systolic.  6. HLD: Currently on Lipitor 20 mg daily.  7. CKD stage IV: Looking back, it appears that her renal function has gradually worsened, we will obtain CMP which will allow [place] to see her renal function and also LFT and total albumin level as patient's believe she has good appetite however care provider service her appetite has been very poor. I am concerned that if she does not have adequate intake and she continues on the current dose of diuretic, she will only have worsening renal function. Fortunately, according to care provider, they have been giving her Ensure to help with improving nutrition intake.   Medication Adjustments/Labs and Tests Ordered: Current medicines are reviewed at length with the patient today.  Concerns regarding medicines are outlined above.  Medication changes, Labs and Tests ordered today are listed in the Patient Instructions below. Patient Instructions  Medication Instructions:  Your physician recommends that you continue on your current medications as directed. Please refer to the Current Medication list given to you today.   Labwork: Your physician recommends that you return for lab work in: TODAY  The lab can be found on the FIRST FLOOR of out building in  [address]   Testing/Procedures: Your physician has requested that you have an echocardiogram. Echocardiography is a painless test that uses sound waves to create images of your heart. It provides your doctor with information about the size and shape of your heart and how well your heart's chambers and valves are working. This procedure takes approximately one hour. There  are no restrictions for this procedure.   Follow-Up: Your physician recommends that you schedule a follow-up appointment in: 2 months with Dr. Claiborne Billings.   Any Other Special Instructions Will Be Listed Below (If Applicable).   .I will get your lab work from your Primary Care Physician.     If you need a refill on your cardiac medications before your next appointment, please call your pharmacy.      Hilbert Corrigan, [place]  04/15/2016 12:49 PM    [facility] [place], [place], [place]  [address] Phone: [phone]; Fax: [phone]

## 2016-04-17 ENCOUNTER — Telehealth: Payer: Self-pay | Admitting: Cardiovascular Disease

## 2016-04-17 NOTE — Telephone Encounter (Signed)
Records received from [facility] for apt on 06/18/16 with Dr Claiborne Billings. Records given to [employer] (medical records) CN

## 2016-04-17 NOTE — Telephone Encounter (Signed)
Returned call to [employer], and left a message for her to call back re: pts lab results.

## 2016-04-17 NOTE — Telephone Encounter (Signed)
-----   Message from [place], [place] sent at 04/16/2016 12:54 PM EDT ----- Kidney function and electrolyte stable. White blood cell and red blood cell all normal.

## 2016-04-17 NOTE — Telephone Encounter (Signed)
Follow Up:; ° ° °Returning your call. °

## 2016-04-17 NOTE — Telephone Encounter (Signed)
New message    Pts niece is calling to get test results. Please call.

## 2016-04-17 NOTE — Telephone Encounter (Signed)
Pt niece, POA, Anne Ng, has been made aware of pts lab results and verbalized understanding.

## 2016-04-29 ENCOUNTER — Other Ambulatory Visit (HOSPITAL_COMMUNITY): Payer: Medicare Other

## 2016-04-30 ENCOUNTER — Other Ambulatory Visit: Payer: Self-pay | Admitting: Emergency Medicine

## 2016-04-30 NOTE — Telephone Encounter (Signed)
09/2015 last ov 03/2016 last labs Next appt. 05/2016

## 2016-05-15 ENCOUNTER — Ambulatory Visit (HOSPITAL_BASED_OUTPATIENT_CLINIC_OR_DEPARTMENT_OTHER): Payer: Medicare Other

## 2016-05-15 ENCOUNTER — Other Ambulatory Visit (HOSPITAL_BASED_OUTPATIENT_CLINIC_OR_DEPARTMENT_OTHER): Payer: Medicare Other

## 2016-05-15 ENCOUNTER — Ambulatory Visit (HOSPITAL_BASED_OUTPATIENT_CLINIC_OR_DEPARTMENT_OTHER): Payer: Medicare Other | Admitting: Family

## 2016-05-15 ENCOUNTER — Encounter: Payer: Self-pay | Admitting: Family

## 2016-05-15 VITALS — BP 172/76 | HR 57 | Temp 98.1°F | Resp 14 | Ht <= 58 in | Wt 113.0 lb

## 2016-05-15 DIAGNOSIS — D693 Immune thrombocytopenic purpura: Secondary | ICD-10-CM

## 2016-05-15 LAB — CBC WITH DIFFERENTIAL (CANCER CENTER ONLY)
BASO#: 0 10*3/uL (ref 0.0–0.2)
BASO%: 0.4 % (ref 0.0–2.0)
EOS%: 6.9 % (ref 0.0–7.0)
Eosinophils Absolute: 0.6 10*3/uL — ABNORMAL HIGH (ref 0.0–0.5)
HCT: 36.4 % (ref 34.8–46.6)
HEMOGLOBIN: 12.6 g/dL (ref 11.6–15.9)
LYMPH#: 1.3 10*3/uL (ref 0.9–3.3)
LYMPH%: 15.1 % (ref 14.0–48.0)
MCH: 34.2 pg — ABNORMAL HIGH (ref 26.0–34.0)
MCHC: 34.6 g/dL (ref 32.0–36.0)
MCV: 99 fL (ref 81–101)
MONO#: 0.7 10*3/uL (ref 0.1–0.9)
MONO%: 8.1 % (ref 0.0–13.0)
NEUT%: 69.5 % (ref 39.6–80.0)
NEUTROS ABS: 5.8 10*3/uL (ref 1.5–6.5)
Platelets: 56 10*3/uL — ABNORMAL LOW (ref 145–400)
RBC: 3.68 10*6/uL — AB (ref 3.70–5.32)
RDW: 12.4 % (ref 11.1–15.7)
WBC: 8.4 10*3/uL (ref 3.9–10.0)

## 2016-05-15 LAB — CHCC SATELLITE - SMEAR

## 2016-05-15 MED ORDER — ROMIPLOSTIM INJECTION 500 MCG
310.0000 ug | Freq: Once | SUBCUTANEOUS | Status: AC
  Administered 2016-05-15: 310 ug via SUBCUTANEOUS
  Filled 2016-05-15: qty 0.62

## 2016-05-15 NOTE — Progress Notes (Signed)
Hematology and Oncology Follow Up Visit  Lisa James [id number] March 15, 1928 82 y.o. 05/15/2016   Principle Diagnosis:  Thrombocytopenia-likely immune thrombocytopenia  Current Therapy:   Nplate as indicated - maintain platelet count > than 70,000    Interim History: Lisa James is here today for follow-up. She is doing well an has no complaints at this time. Lisa platelet count is stable at 56. Hgb is 12.6 with an MCV of 99. She has had no issues with bleeding, bruising or petechiae.  No fatigue, fever, chills, n/v, cough, rash, headache, dizziness, SOB, chest pain, palpitations, abdominal pain or changes in Lisa bowel or bladder habits.  No swelling, tenderness, numbness or tingling. No c/o of joint aches or bone pain. No falls or syncopal episodes.  No lymphadenopathy found on exam.   She has maintained a good appetite and is staying well hydrated. Lisa weight is stable.  She continues to stay active walking each day. She states that she "takes after Lisa James who lived to be 52."  Medications:    Medication List       Accurate as of 05/15/16 11:15 AM. Always use your most recent med list.          atorvastatin 20 MG tablet Commonly known as:  LIPITOR TAKE 1 TABLET (20 MG TOTAL) BY MOUTH DAILY.   calcium-vitamin D 500-200 MG-UNIT tablet Take 1 tablet by mouth daily.   carvedilol 6.25 MG tablet Commonly known as:  COREG Take 1 tablet (6.25 mg total) by mouth 2 (two) times daily with a meal.   fluticasone 50 MCG/ACT nasal spray Commonly known as:  FLONASE Place 2 sprays into both nostrils daily.   furosemide 20 MG tablet Commonly known as:  LASIX TAKE 1 TABLET (20 MG TOTAL) BY MOUTH DAILY.   irbesartan 300 MG tablet Commonly known as:  AVAPRO Take 1 tablet (300 mg total) by mouth daily.   KLOR-CON M20 20 MEQ tablet Generic drug:  potassium chloride SA TAKE 1 TABLET EVERY DAY   mupirocin ointment 2 % Commonly known as:  BACTROBAN Apply small amount to  surgical area left ear twice a day   PRESERVISION AREDS PO Take 1 tablet by mouth daily.   romiPLOStim 250 MCG injection Commonly known as:  NPLATE Inject 1 mcg/kg into the skin as needed (every 2-3 weeks for low platelets). Given @ Oncology   sertraline 25 MG tablet Commonly known as:  ZOLOFT TAKE 1 TABLET BY MOUTH EVERY DAY   spironolactone 25 MG tablet Commonly known as:  ALDACTONE Take 25 mg by mouth daily.   Vitamin D 1000 units capsule Take 1,000 Units by mouth at bedtime.       Allergies:  Allergies  Allergen Reactions  . Ace Inhibitors Cough    Past Medical History, Surgical history, Social history, and Family History were reviewed and updated.  Review of Systems: All other 10 point review of systems is negative.   Physical Exam:  height is 4\' 10"  (1.473 m) and weight is 113 lb (51.3 kg). Lisa oral temperature is 98.1 F (36.7 C). Lisa blood pressure is 172/76 (abnormal) and Lisa pulse is 57 (abnormal). Lisa respiration is 14.   Wt Readings from Last 3 Encounters:  05/15/16 113 lb (51.3 kg)  04/15/16 114 lb 9.6 oz (52 kg)  04/10/16 112 lb (50.8 kg)    Ocular: Sclerae unicteric, pupils equal, round and reactive to light Ear-nose-throat: Oropharynx clear, dentition fair Lymphatic: No cervical supraclavicular or axillary adenopathy Lungs no rales  or rhonchi, good excursion bilaterally Heart regular rate and rhythm, no murmur appreciated Abd soft, nontender, positive bowel sounds, no liver or spleen tip palpated on exam, no fluid wave  MSK no focal spinal tenderness, no joint edema Neuro: non-focal, well-oriented, appropriate affect Breasts: Deferred  Lab Results  Component Value Date   WBC 8.4 05/15/2016   HGB 12.6 05/15/2016   HCT 36.4 05/15/2016   MCV 99 05/15/2016   PLT 56 (L) 05/15/2016   No results found for: FERRITIN, IRON, TIBC, UIBC, IRONPCTSAT Lab Results  Component Value Date   RETICCTPCT 2.6 05/13/2011   RBC 3.68 (L) 05/15/2016   No  results found for: Nils Pyle [facility] Lab Results  Component Value Date   IGGSERUM 720 09/29/2012   IGA 115 09/29/2012   IGMSERUM 73 09/29/2012   Lab Results  Component Value Date   TOTALPROTELP 6.9 09/29/2012   TOTALPROTELP 6.9 09/29/2012   ALBUMINELP 63.4 09/29/2012   A1GS 4.3 09/29/2012   A2GS 12.3 (H) 09/29/2012   BETS 7.2 09/29/2012   BETA2SER 3.6 09/29/2012   GAMS 9.2 (L) 09/29/2012   MSPIKE NOT DET 09/29/2012   SPEI  09/29/2012     Comment:     Nonspecific pattern associated with slightly decreased gamma globulins. Reviewed by Lisa Hollingshead, MD, PhD, FCAP (Electronic Signature on File)     Chemistry      Component Value Date/Time   NA 140 04/15/2016 1153   NA 142 01/21/2013 1157   K 4.4 04/15/2016 1153   K 5.3 (H) 01/21/2013 1157   CL 104 04/15/2016 1153   CL 103 01/21/2013 1157   CO2 27 04/15/2016 1153   CO2 27 01/21/2013 1157   BUN 28 (H) 04/15/2016 1153   BUN 32 (H) 01/21/2013 1157   CREATININE 1.30 (H) 04/15/2016 1153      Component Value Date/Time   CALCIUM 9.8 04/15/2016 1153   CALCIUM 9.8 01/21/2013 1157   ALKPHOS 71 04/15/2016 1153   ALKPHOS 67 01/21/2013 1157   AST 21 04/15/2016 1153   AST 24 01/21/2013 1157   ALT 12 04/15/2016 1153   ALT 18 01/21/2013 1157   BILITOT 1.1 04/15/2016 1153   BILITOT 1.10 01/21/2013 1157     Impression and Plan: Lisa James is a very pleasant 82 yo female with immune thrombocytopenia. She continues to do well and is staying active as always.  Platelet count today is 56 so we will proceed with Lisa Nplate injection today as planned. Pharmacy will adjust dose per Lisa today's platelet count. No issues with bleeding or bruising.  We will plan to see Lisa back in 1 month for labs and follow-up. She will contact us with any questions or concerns. We can certainly see Lisa sooner if need be.    Eliezer Bottom, NP 10/25/201711:15 AM

## 2016-05-15 NOTE — Patient Instructions (Signed)
Romiplostim injection What is this medicine? ROMIPLOSTIM (roe mi PLOE stim) helps your body make more platelets. This medicine is used to treat low platelets caused by chronic idiopathic thrombocytopenic purpura (ITP). This medicine may be used for other purposes; ask your health care provider or pharmacist if you have questions. What should I tell my health care provider before I take this medicine? They need to know if you have any of these conditions: -cancer or myelodysplastic syndrome -low blood counts, like low white cell, platelet, or red cell counts -take medicines that treat or prevent blood clots -an unusual or allergic reaction to romiplostim, mannitol, other medicines, foods, dyes, or preservatives -pregnant or trying to get pregnant -breast-feeding How should I use this medicine? This medicine is for injection under the skin. It is given by a health care professional in a hospital or clinic setting. A special MedGuide will be given to you before your injection. Read this information carefully each time. Talk to your pediatrician regarding the use of this medicine in children. Special care may be needed. Overdosage: If you think you have taken too much of this medicine contact a poison control center or emergency room at once. NOTE: This medicine is only for you. Do not share this medicine with others. What if I miss a dose? It is important not to miss your dose. Call your doctor or health care professional if you are unable to keep an appointment. What may interact with this medicine? Interactions are not expected. This list may not describe all possible interactions. Give your health care provider a list of all the medicines, herbs, non-prescription drugs, or dietary supplements you use. Also tell them if you smoke, drink alcohol, or use illegal drugs. Some items may interact with your medicine. What should I watch for while using this medicine? Your condition will be monitored  carefully while you are receiving this medicine. Visit your prescriber or health care professional for regular checks on your progress and for the needed blood tests. It is important to keep all appointments. What side effects may I notice from receiving this medicine? Side effects that you should report to your doctor or health care professional as soon as possible: -allergic reactions like skin rash, itching or hives, swelling of the face, lips, or tongue -shortness of breath, chest pain, swelling in a leg -unusual bleeding or bruising Side effects that usually do not require medical attention (report to your doctor or health care professional if they continue or are bothersome): -dizziness -headache -muscle aches -pain in arms and legs -stomach pain -trouble sleeping This list may not describe all possible side effects. Call your doctor for medical advice about side effects. You may report side effects to FDA at 1-800-FDA-1088. Where should I keep my medicine? This drug is given in a hospital or clinic and will not be stored at home. NOTE: This sheet is a summary. It may not cover all possible information. If you have questions about this medicine, talk to your doctor, pharmacist, or health care provider.    2016, Elsevier/Gold Standard. (2008-03-07 15:13:04)  

## 2016-05-29 ENCOUNTER — Encounter: Payer: Self-pay | Admitting: Gastroenterology

## 2016-06-05 ENCOUNTER — Other Ambulatory Visit: Payer: Self-pay | Admitting: Hematology

## 2016-06-06 ENCOUNTER — Telehealth: Payer: Self-pay | Admitting: Hematology

## 2016-06-06 NOTE — Telephone Encounter (Signed)
Lt vm regarding transfer of care appt on 06/21/16 with Dr. Burr Medico

## 2016-06-14 ENCOUNTER — Ambulatory Visit: Payer: Medicare Other

## 2016-06-14 ENCOUNTER — Other Ambulatory Visit: Payer: Medicare Other

## 2016-06-14 ENCOUNTER — Ambulatory Visit: Payer: Medicare Other | Admitting: Hematology & Oncology

## 2016-06-18 ENCOUNTER — Encounter: Payer: Self-pay | Admitting: Cardiovascular Disease

## 2016-06-18 ENCOUNTER — Ambulatory Visit (INDEPENDENT_AMBULATORY_CARE_PROVIDER_SITE_OTHER): Payer: Medicare Other | Admitting: Cardiovascular Disease

## 2016-06-18 VITALS — BP 118/54 | HR 61 | Ht 60.0 in | Wt 115.6 lb

## 2016-06-18 DIAGNOSIS — I10 Essential (primary) hypertension: Secondary | ICD-10-CM

## 2016-06-18 DIAGNOSIS — S065XAA Traumatic subdural hemorrhage with loss of consciousness status unknown, initial encounter: Secondary | ICD-10-CM

## 2016-06-18 DIAGNOSIS — I350 Nonrheumatic aortic (valve) stenosis: Secondary | ICD-10-CM

## 2016-06-18 DIAGNOSIS — Z79899 Other long term (current) drug therapy: Secondary | ICD-10-CM

## 2016-06-18 DIAGNOSIS — Z794 Long term (current) use of insulin: Secondary | ICD-10-CM

## 2016-06-18 DIAGNOSIS — S065X9A Traumatic subdural hemorrhage with loss of consciousness of unspecified duration, initial encounter: Secondary | ICD-10-CM

## 2016-06-18 DIAGNOSIS — N183 Chronic kidney disease, stage 3 unspecified: Secondary | ICD-10-CM

## 2016-06-18 DIAGNOSIS — E782 Mixed hyperlipidemia: Secondary | ICD-10-CM | POA: Diagnosis not present

## 2016-06-18 DIAGNOSIS — I6200 Nontraumatic subdural hemorrhage, unspecified: Secondary | ICD-10-CM

## 2016-06-18 DIAGNOSIS — I25810 Atherosclerosis of coronary artery bypass graft(s) without angina pectoris: Secondary | ICD-10-CM | POA: Diagnosis not present

## 2016-06-18 DIAGNOSIS — E119 Type 2 diabetes mellitus without complications: Secondary | ICD-10-CM | POA: Diagnosis not present

## 2016-06-18 DIAGNOSIS — I5022 Chronic systolic (congestive) heart failure: Secondary | ICD-10-CM

## 2016-06-18 NOTE — Patient Instructions (Signed)
Your physician has requested that you have an echocardiogram. Echocardiography is a painless test that uses sound waves to create images of your heart. It provides your doctor with information about the size and shape of your heart and how well your heart's chambers and valves are working. This procedure takes approximately one hour. There are no restrictions for this procedure. This will be done at the [facility].  Your physician recommends that you return for lab work in: 1 month.  Your physician has recommended you make the following change in your medication:   1.) [place]  Your physician recommends that you schedule a follow-up appointment in: 3 months.

## 2016-06-19 NOTE — Progress Notes (Signed)
Patient ID: Lisa James, female   DOB: 27-Mar-1928, 82 y.o.   MRN: [id number]     HPI: Lisa James is an 82 year old female who presents to the office for followup cardiology evaluation. Remotely, the patient had been seen by Dr. Jenell Milliner. She established cardiology care with me in 2014.  She presents for 22 month follow-up cardiology evaluation.  Lisa James has a history of hypertension, hyperlipidemia, and CAD. In March 2009 she underwent emergent CABG surgery x5 by Dr. Servando Snare following myocardial infarction. An echo Doppler study in 2010 showed an ejection fraction in the 40-45% range and there was evidence for mild aortic stenosis. Additional problems also include a subdural hematoma, a history of immune thrombocytopenia and most recently has been on Nplate therapy  followed by Dr. Marin Olp. There is also mention in the chart of an occlusion and stenosis of carotid artery without mention of cerebral infarction. She last saw Dr. Verl Blalock several years ago.  Lisa James remains active. She lives at [facility]. She remains active. She denies recent chest pain. At times and some mild shortness of breath. She is unaware of palpitations.  A nuclear perfusion study on 05/06/2013  revealed normal perfusion without scar or ischemia. Post stress ejection fraction was 60%. An echo Doppler study revealed an ejection fraction of 60-65%. She had mild grade 1 diastolic dysfunction with moderate left ventricular hypertrophy. She had a heavily calcified posterior mitral valve annulus. There was mild aortic regurgitation. She had mild tricuspid regurgitation. Her aortic valve area was 1.1-1.2 cm and she was felt to have mild to moderate aortic stenosis. Her mean gradient was 13 and peak gradient was 23 mm.  Since I last saw her , had experienced a fall.  She states she had lost her balance.  She denies any associated palpitations.  She denies any episodes of dizziness prior to her fall.  She  denies PND, orthopnea.  She denies chest pain.  She presents for follow-up evaluation.  Past Medical History:  Diagnosis Date  . Acute MI, lateral wall ([place])   . Aortic stenosis   . Chronic kidney disease   . Chronic systolic heart failure ([place])   . Coronary artery disease    sees Dr Claiborne Billings every 6 months  . DM (diabetes mellitus) ([place])    type 2 ; diet controlled  . HTN (hypertension)   . Immune thrombocytopenic purpura ([place]) 01/21/2013  . Ischemic heart disease   . Mitral regurgitation   . Non Hodgkin's lymphoma ([place])    of the throat  . Subdural hematoma, post-traumatic ([place]) 2015   sees Dr Sherwood Gambler every 6 months  . Thrombocytopenia ([place])    sees Dr Marin Olp every 2 weeks ;receives Nplate prn    Past Surgical History:  Procedure Laterality Date  . BONE MARROW BIOPSY  11/22/2002   left posterior iliac crest bone marrow biopsy and aspirate  . CATARACT EXTRACTION W/ INTRAOCULAR LENS  IMPLANT, BILATERAL Bilateral   . CORONARY ARTERY BYPASS GRAFT  11/20/2007   x5  . EYE SURGERY    . submental lymph node excisional biopsy  10/22/2002    Allergies  Allergen Reactions  . Ace Inhibitors Cough    Current Outpatient Prescriptions  Medication Sig Dispense Refill  . atorvastatin (LIPITOR) 20 MG tablet TAKE 1 TABLET (20 MG TOTAL) BY MOUTH DAILY. 30 tablet 0  . Calcium Carbonate-Vitamin D (CALCIUM-VITAMIN D) 500-200 MG-UNIT per tablet Take 1 tablet by mouth daily.     . carvedilol (  COREG) 6.25 MG tablet Take 1 tablet (6.25 mg total) by mouth 2 (two) times daily with a meal. 180 tablet 1  . Cholecalciferol (VITAMIN D) 1000 UNITS capsule Take 1,000 Units by mouth at bedtime.     . fluticasone (FLONASE) 50 MCG/ACT nasal spray Place 2 sprays into both nostrils daily.    . irbesartan (AVAPRO) 300 MG tablet Take 1 tablet (300 mg total) by mouth daily. 30 tablet 3  . KLOR-CON M20 20 MEQ tablet TAKE 1 TABLET EVERY DAY 90 tablet 3  . Multiple Vitamins-Minerals (PRESERVISION AREDS PO) Take 1 tablet  by mouth daily.    . mupirocin ointment (BACTROBAN) 2 % Apply small amount to surgical area left ear twice a day 22 g 0  . romiPLOStim (NPLATE) 250 MCG injection Inject 1 mcg/kg into the skin as needed (every 2-3 weeks for low platelets). Given @ Oncology    . sertraline (ZOLOFT) 25 MG tablet TAKE 1 TABLET BY MOUTH EVERY DAY 90 tablet 0  . spironolactone (ALDACTONE) 25 MG tablet Take 25 mg by mouth daily.     No current facility-administered medications for this visit.     Socially she is a widow for 11 years. She has 2 children 4 grandchildren and 3 great-grandchildren. 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 Remotely she used to use snuff but quit this 20 years ago. There is no alcohol use. He does walk  Family History  Problem Relation Age of Onset  . Heart attack Father   . Diabetes Father    Of note, her mother lived until age 74. Father died around 35 years of age.   ROS General: Negative; No fevers, chills, or night sweats;  HEENT: Negative; No changes in vision or hearing, sinus congestion, difficulty swallowing Pulmonary: Negative; No cough, wheezing, shortness of breath, hemoptysis Cardiovascular: Negative; No chest pain, presyncope, syncope, palpitations GI: Negative; No nausea, vomiting, diarrhea, or abdominal pain GU: Negative; No dysuria, hematuria, or difficulty voiding Musculoskeletal: Negative; no myalgias, joint pain, or weakness Hematologic/Oncology: Negative; no easy bruising, bleeding Endocrine: Negative; no heat/cold intolerance; no diabetes Neuro: Occasional loss of balance; no headaches, dizziness Skin: Negative; No rashes or skin lesions Psychiatric: Negative; No behavioral problems, depression Sleep: Negative; No snoring, daytime sleepiness, hypersomnolence, bruxism, restless legs, hypnogognic hallucinations, no cataplexy Other comprehensive 14 point system review is negative.  PE BP (!) 118/54   Pulse 61   Ht 5' (1.524 m)   Wt 115  lb 9.6 oz (52.4 kg)   LMP  (LMP Unknown)   BMI 22.58 kg/m    Repeat blood pressure by me was 98/60.  Wt Readings from Last 3 Encounters:  06/18/16 115 lb 9.6 oz (52.4 kg)  05/15/16 113 lb (51.3 kg)  04/15/16 114 lb 9.6 oz (52 kg)   General: Alert, oriented, no distress.  Skin: normal turgor, no rashes  HEENT: Normocephalic, atraumatic. Pupils round and reactive; sclera anicteric; Fundi mild arteriolar narrowing. Nose without nasal septal hypertrophy Mouth/Parynx benign; Mallinpatti scale Neck: No JVD, no carotid briuts Mildly kyphotic Lungs: clear to ausculatation and percussion; no wheezing or rales Chest wall: No tenderness to palpation Heart: RRR, s1 s2 normal 2/6 systolic murmur in the aortic region. Mild transmission to carotids Abdomen: soft, nontender; no hepatosplenomehaly, BS+; abdominal aorta nontender and not dilated by palpation. Back: No CVA tenderness Pulses 2+ Extremities: no clubbinbg cyanosis or edema, Homan's sign negative  Neurologic: grossly nonfocal Psychologic: normal affect and mood  ECG (independently read by me):  Normal sinus rhythm at 61 bpm.  LVH by voltage criteria.  Atrial premature complex.  T-wave abnormalities.  January 2016 ECG (independently read by me): Sinus rhythm with sinus arrhythmia and PACs.  QTc interval 454 ms.   LABS: BMP Latest Ref Rng & Units 04/15/2016 07/04/2015 05/27/2015  Glucose 65 - 99 mg/dL 115(H) 142(H) 189(H)  BUN 7 - 25 mg/dL 28(H) 35(H) 25  Creatinine 0.60 - 0.88 mg/dL 1.30(H) 1.58(H) 1.41(H)  Sodium 135 - 146 mmol/L 140 138 131(L)  Potassium 3.5 - 5.3 mmol/L 4.4 4.5 4.0  Chloride 98 - 110 mmol/L 104 105 97(L)  CO2 20 - 31 mmol/L '27 23 25  ' Calcium 8.6 - 10.4 mg/dL 9.8 9.2 9.1   Hepatic Function Latest Ref Rng & Units 04/15/2016 05/13/2015 05/12/2015  Total Protein 6.1 - 8.1 g/dL 5.9(L) 5.7(L) 5.8(L)  Albumin 3.6 - 5.1 g/dL 4.0 3.6 3.6  AST 10 - 35 U/L '21 21 22  ' ALT 6 - 29 U/L 12 12(L) 12(L)  Alk Phosphatase 33 -  130 U/L 71 76 77  Total Bilirubin 0.2 - 1.2 mg/dL 1.1 1.3(H) 1.2  Bilirubin, Direct 0.0 - 0.3 mg/dL - - -   CBC Latest Ref Rng & Units 05/15/2016 04/15/2016 04/10/2016  WBC 3.9 - 10.0 10e3/uL 8.4 5.6 5.5  Hemoglobin 11.6 - 15.9 g/dL 12.6 12.8 13.1  Hematocrit 34.8 - 46.6 % 36.4 38.6 36.9  Platelets 145 - 400 10e3/uL 56(L) 79(L) 49(L)   Lab Results  Component Value Date   MCV 99 05/15/2016   MCV 99.7 04/15/2016   MCV 98 04/10/2016   Lab Results  Component Value Date   TSH 4.524 (H) 05/13/2015   Lab Results  Component Value Date   HGBA1C 6.7 10/05/2015   Lipid Panel     Component Value Date/Time   CHOL 117 09/08/2014 1038   TRIG 224 (H) 09/08/2014 1038   HDL 25 (L) 09/08/2014 1038   CHOLHDL 4.7 09/08/2014 1038   VLDL 45 (H) 09/08/2014 1038   LDLCALC 47 09/08/2014 1038   LDLDIRECT 64.4 06/20/2009 0000      RADIOLOGY: No results found.   ASSESSMENT AND PLAN: Lisa James is an 82 year old female with a history of hypertension, mild renal insufficiency, CAD  who  suffered a myocardial infarction in 2009 and underwent CABG surgery x5 by Dr. Servando Snare. Additional problems have included the development of chronic immune thrombocytopenia for which she currently is under therapy with Dr. Marin Olp. There is a history of a subdural hematoma.  And she has had subsequent CT scans of her head which have revealed a stable chronic calcified left subdural hematoma, unchanged. Her last recent nuclear perfusion scan in 2014 showed normal perfusion without scar or ischemia. Her last echo Doppler study in 2016 showed mild LVH with an ejection fraction of 60-65%.  There was grade 1 diastolic dysfunction.  He was mild aortic stenosis, severe mitral annular calcification with moderate MR, moderate left atrial dilatation, and moderate TR.  Her blood pressure was low when taken by me.  She does not have any signs of edema, and a review of her medications reveals that she is taking both furosemide as well  as spironolactone in addition to irbesartan 3 mg daily and carvedilol 6.25 mg twice a day.  I have recommended she discontinue furosemide and only take this if she notices significant swelling.  I have recommended that she undergo a follow-up echo Doppler evaluation to reassess both systolic and diastolic function as well as evaluate for  progressive valvular disease.I reviewed laboratory from several months ago.  She has stage III chronic kidney disease.  Repeat laboratory will be checked in the fasting state in one month.  I will see her back in the office in 3 months for evaluation.  Time spent: 25 minutes Troy Sine, MD, [facility] 06/19/2016 7:33 PM

## 2016-06-20 NOTE — Progress Notes (Signed)
Lisa James  Telephone:(336) [phone] Fax:(336) [phone]  Clinic Follow Up Note   Patient Care Team: Merrilee Seashore, MD as PCP - General (Internal Medicine) Truitt Merle, MD as Consulting Physician (Hematology) 06/21/2016  CHIEF COMPLAINTS:  Immune thrombocytopenia  CURRENT THERAPY: Nplate 57mg/kg every 4 weeks to maintain plt>50K   HISTORY OF PRESENTING ILLNESS (06/21/2016):  Lisa Drafts89y.o. female was previously seen for immune thrombocytopenia care by Dr. EMarin Olp Her thrombocytopenia is well managed with Nplate. She received Nplate every 4 to 5 weeks. Her last visit and injection in his office was on 05/15/2016.   The patient is here to establish care because our office is more convenient for transportation. She is accompanied by her caregiver. She does not recall how long she has had platelet management care. She does not recall if she took steroids in the past. She denies gum bleeding and has false teeth. She denies nose bleeding, hematuria, or blood in stool. She denies any pain, bowel issue, or breathing issue.   She lives in independent living. Her care giver notes the patient "eats like a bird". She does eat a particularly well rounded diet. Her care giver has provided her with Boost/Ensure to help maintain her weight instead of sodas. The patient enjoys these drinks. Her care giver works with her 7 days a week.   She recently fell at her niece's house at Thanksgiving and twisted her ankle. This is healing well. She also fell at her cardiologist's office on 06/18/16 and hit her head on a water fountain. She often falls at the doctor's office. She recently began using a cane to ambulate. She does not use a walker.   She does not smoke or drink.    MEDICAL HISTORY:  Past Medical History:  Diagnosis Date  . Acute MI, lateral wall ([facility]   . Aortic stenosis   . Chronic kidney disease   . Chronic systolic heart failure ([facility]   . Coronary artery disease     sees Dr KClaiborne Billingsevery 6 months  . DM (diabetes mellitus) ([facility]    type 2 ; diet controlled  . HTN (hypertension)   . Immune thrombocytopenic purpura ([facility] 01/21/2013  . Ischemic heart disease   . Mitral regurgitation   . Non Hodgkin's lymphoma ([place]    of the throat  . Subdural hematoma, post-traumatic ([facility] 2015   sees Dr NSherwood Gamblerevery 6 months  . Thrombocytopenia ([facility]    sees Dr EMarin Olpevery 2 weeks ;receives Nplate prn    SURGICAL HISTORY: Past Surgical History:  Procedure Laterality Date  . BONE MARROW BIOPSY  11/22/2002   left posterior iliac crest bone marrow biopsy and aspirate  . CATARACT EXTRACTION W/ INTRAOCULAR LENS  IMPLANT, BILATERAL Bilateral   . CORONARY ARTERY BYPASS GRAFT  11/20/2007   x5  . EYE SURGERY    . submental lymph node excisional biopsy  10/22/2002    SOCIAL HISTORY: Social History   Social History  . Marital status: Widowed    Spouse name: Lisa James  . Number of children: Lisa James  . Years of education: Lisa James   Occupational History  . Not on file.   Social History Main Topics  . Smoking status: Never Smoker  . Smokeless tobacco: Never Used     Comment: never used tobacco.  . Alcohol use No  . Drug use: No  . Sexual activity: No   Other Topics Concern  . Not on file   Social History Narrative  . No  narrative on file    FAMILY HISTORY: Family History  Problem Relation Age of Onset  . Heart attack Father   . Diabetes Father     ALLERGIES:  is allergic to ace inhibitors.  MEDICATIONS:  Current Outpatient Prescriptions  Medication Sig Dispense Refill  . atorvastatin (LIPITOR) 20 MG tablet TAKE 1 TABLET (20 MG TOTAL) BY MOUTH DAILY. 30 tablet 0  . Calcium Carbonate-Vitamin D (CALCIUM-VITAMIN D) 500-200 MG-UNIT per tablet Take 1 tablet by mouth daily.     . carvedilol (COREG) 6.25 MG tablet Take 1 tablet (6.25 mg total) by mouth 2 (two) times daily with a meal. 180 tablet 1  . Cholecalciferol (VITAMIN D) 1000 UNITS capsule Take 1,000 Units by  mouth at bedtime.     . fluticasone (FLONASE) 50 MCG/ACT nasal spray Place 2 sprays into both nostrils daily.    . irbesartan (AVAPRO) 300 MG tablet Take 1 tablet (300 mg total) by mouth daily. 30 tablet 3  . KLOR-CON M20 20 MEQ tablet TAKE 1 TABLET EVERY DAY 90 tablet 3  . Multiple Vitamins-Minerals (PRESERVISION AREDS PO) Take 1 tablet by mouth daily.    . mupirocin ointment (BACTROBAN) 2 % Apply small amount to surgical area left ear twice a day 22 g 0  . romiPLOStim (NPLATE) 250 MCG injection Inject 1 mcg/kg into the skin as needed (every 2-3 weeks for low platelets). Given @ Oncology    . sertraline (ZOLOFT) 25 MG tablet TAKE 1 TABLET BY MOUTH EVERY DAY 90 tablet 0  . spironolactone (ALDACTONE) 25 MG tablet Take 25 mg by mouth daily.     No current facility-administered medications for this visit.     REVIEW OF SYSTEMS:   Constitutional: Denies fevers, chills or abnormal night sweats (+) decreased appetite Eyes: Denies blurriness of vision, double vision or watery eyes Ears, nose, mouth, throat, and face: Denies mucositis or sore throat Respiratory: Denies cough, dyspnea or wheezes Cardiovascular: Denies palpitation, chest discomfort or lower extremity swelling Gastrointestinal:  Denies nausea, heartburn or change in bowel habits Skin: Denies abnormal skin rashes Lymphatics: Denies new lymphadenopathy or easy bruising Musculoskeletal: (+) falls Neurological:Denies numbness, tingling or new weaknesses Behavioral/Psych: Mood is stable, no new changes  All other systems were reviewed with the patient and are negative.  PHYSICAL EXAMINATION: ECOG PERFORMANCE STATUS: 3 - Symptomatic, >50% confined to bed  Vitals:   06/21/16 1122  BP: (!) 187/65  Pulse: 66  Resp: 18  Temp: 98.4 F (36.9 C)   Filed Weights   06/21/16 1122  Weight: 114 lb 12.8 oz (52.1 kg)    GENERAL:alert, no distress and comfortable. Able to get on exam table with assistance. Uses cane to ambulate. SKIN:  skin color, texture, turgor are normal, no rashes or significant lesions EYES: normal, conjunctiva are pink and non-injected, sclera clear OROPHARYNX:no exudate, no erythema and lips, buccal mucosa, and tongue normal  NECK: supple, thyroid normal size, non-tender, without nodularity LYMPH:  no palpable lymphadenopathy in the cervical, axillary or inguinal LUNGS: clear to auscultation and percussion with normal breathing effort HEART: regular rate & rhythm and no murmurs and no lower extremity edema ABDOMEN:abdomen soft, non-tender and normal bowel sounds Musculoskeletal:no cyanosis of digits and no clubbing   PSYCH: alert & oriented x 3 with fluent speech NEURO: no focal motor/sensory deficits   LABORATORY DATA:  I have reviewed the data as listed CBC Latest Ref Rng & Units 06/21/2016 05/15/2016 04/15/2016  WBC 3.9 - 10.3 10e3/uL 7.1 8.4 5.6  Hemoglobin  11.6 - 15.9 g/dL 13.0 12.6 12.8  Hematocrit 34.8 - 46.6 % 38.9 36.4 38.6  Platelets 145 - 400 10e3/uL 61(L) 56(L) 79(L)   CMP Latest Ref Rng & Units 06/21/2016 04/15/2016 07/04/2015  Glucose 70 - 140 mg/dl 128 115(H) 142(H)  BUN 7.0 - 26.0 mg/dL 17.6 28(H) 35(H)  Creatinine 0.6 - 1.1 mg/dL 1.5(H) 1.30(H) 1.58(H)  Sodium 136 - 145 mEq/L 139 140 138  Potassium 3.5 - 5.1 mEq/L 4.4 4.4 4.5  Chloride 98 - 110 mmol/L - 104 105  CO2 22 - 29 mEq/L _0 Calcium 8.4 - 10.4 mg/dL 9.7 9.8 9.2  Total Protein 6.4 - 8.3 g/dL 6.7 5.9(L) -  Total Bilirubin 0.20 - 1.20 mg/dL 1.71(H) 1.1 -  Alkaline Phos 40 - 150 U/L 110 71 -  AST 5 - 34 U/L 25 21 -  ALT 0 - 55 U/L 18 12 -   PATHOLOGY REPORT  Diagnosis 02/25/2013 Bone Marrow Biopsy, left - NORMOCELLULAR MARROW WITH TRILINEAGE HEMATOPOIESIS AND MATURATION. - NO MORPHOLOGIC EVIDENCE OF CLONAL STEM CELL DISORDER OR LYMPHOMA. - SEE COMMENT. PERIPHERAL BLOOD: - THROMBOCYTOPENIA. Diagnosis Note The bone marrow is largely unremarkable. There is no morphologic evidence of a clonal stem cell process  or the patient's prior lymphoma. Compared to the patient's previous bone marrow ([id number]), there has been a mild decrease in cellularity and number of megakaryocytes. There is no morphologic etiology for the patient's progressive thrombocytopenia in the current marrow.  RADIOGRAPHIC STUDIES: I have personally reviewed the radiological images as listed and agreed with the findings in the report. No results found.  ASSESSMENT & PLAN:   1. Idiopathic Thrombocytopenia Purpura (ITP) -I reviewed her outside records extensively including prior bone marrow biopsy -She has no abnormal bleeding or bruising -Thrombocytopenia has been well managed with Nplate. She previously received Nplate injections every 4 to 5 weeks. We will continue with these injections every 4 weeks with labs, if her platelets drop below 50K, will increase her injection frequency. -Labs reviewed. Platelets low at 61K today. -She will contact our office if she has any abnormal bruising or bleeding  2. Decreased appetite -Encouraged the patient to add Glucerna to her diet to avoid weight loss  3. Hypertension, DM -She will follow with her primary care physician -She follows with cardiologist Dr. Claiborne Billings  4. Falls -She began using a cane to ambulate after recent falls -Encouraged the patient to begin using a walker to avoid future falls  Follow up: -She will receive an Nplate injection today -She will return for lab and Nplate injection every 4 weeks -She will return for follow up in 3 months -She will contact our office if she has any abnormal bruising or bleeding  Orders Placed This Encounter  Procedures  . CBC with Differential    Standing Status:   Standing    Number of Occurrences:   40    Standing Expiration Date:   06/21/2021  . Comprehensive metabolic panel    Standing Status:   Standing    Number of Occurrences:   40    Standing Expiration Date:   06/21/2021    All questions were answered. The patient  knows to call the clinic with any problems, questions or concerns.  I spent 30 minutes counseling the patient face to face. The total time spent in the appointment was 40 minutes and more than 50% was on counseling.  This document serves as a record of services personally performed by Truitt Merle, MD. It  was created on her behalf by Arlyce Harman, a trained medical scribe. The creation of this record is based on the scribe's personal observations and the provider's statements to them. This document has been checked and approved by the attending provider.     Truitt Merle, MD 06/21/2016 2:06 PM

## 2016-06-21 ENCOUNTER — Encounter: Payer: Self-pay | Admitting: Hematology

## 2016-06-21 ENCOUNTER — Ambulatory Visit (HOSPITAL_BASED_OUTPATIENT_CLINIC_OR_DEPARTMENT_OTHER): Payer: Medicare Other | Admitting: Hematology

## 2016-06-21 ENCOUNTER — Ambulatory Visit (HOSPITAL_BASED_OUTPATIENT_CLINIC_OR_DEPARTMENT_OTHER): Payer: Medicare Other

## 2016-06-21 ENCOUNTER — Other Ambulatory Visit (HOSPITAL_BASED_OUTPATIENT_CLINIC_OR_DEPARTMENT_OTHER): Payer: Medicare Other

## 2016-06-21 ENCOUNTER — Telehealth: Payer: Self-pay | Admitting: Hematology

## 2016-06-21 VITALS — BP 187/65 | HR 66 | Temp 98.4°F | Resp 18 | Ht 60.0 in | Wt 114.8 lb

## 2016-06-21 DIAGNOSIS — R634 Abnormal weight loss: Secondary | ICD-10-CM

## 2016-06-21 DIAGNOSIS — D693 Immune thrombocytopenic purpura: Secondary | ICD-10-CM | POA: Diagnosis not present

## 2016-06-21 LAB — COMPREHENSIVE METABOLIC PANEL
ALT: 18 U/L (ref 0–55)
AST: 25 U/L (ref 5–34)
Albumin: 3.9 g/dL (ref 3.5–5.0)
Alkaline Phosphatase: 110 U/L (ref 40–150)
Anion Gap: 9 mEq/L (ref 3–11)
BUN: 17.6 mg/dL (ref 7.0–26.0)
CHLORIDE: 103 meq/L (ref 98–109)
CO2: 27 meq/L (ref 22–29)
Calcium: 9.7 mg/dL (ref 8.4–10.4)
Creatinine: 1.5 mg/dL — ABNORMAL HIGH (ref 0.6–1.1)
EGFR: 32 mL/min/{1.73_m2} — ABNORMAL LOW (ref >90)
GLUCOSE: 128 mg/dL (ref 70–140)
Potassium: 4.4 mEq/L (ref 3.5–5.1)
SODIUM: 139 meq/L (ref 136–145)
Total Bilirubin: 1.71 mg/dL — ABNORMAL HIGH (ref 0.20–1.20)
Total Protein: 6.7 g/dL (ref 6.4–8.3)

## 2016-06-21 LAB — CBC WITH DIFFERENTIAL/PLATELET
BASO%: 0.7 % (ref 0.0–2.0)
BASOS ABS: 0.1 10*3/uL (ref 0.0–0.1)
EOS%: 8.2 % — ABNORMAL HIGH (ref 0.0–7.0)
Eosinophils Absolute: 0.6 10*3/uL — ABNORMAL HIGH (ref 0.0–0.5)
HCT: 38.9 % (ref 34.8–46.6)
HGB: 13 g/dL (ref 11.6–15.9)
LYMPH%: 14 % (ref 14.0–49.7)
MCH: 33.2 pg (ref 25.1–34.0)
MCHC: 33.4 g/dL (ref 31.5–36.0)
MCV: 99.6 fL (ref 79.5–101.0)
MONO#: 0.5 10*3/uL (ref 0.1–0.9)
MONO%: 7.2 % (ref 0.0–14.0)
NEUT#: 5 10*3/uL (ref 1.5–6.5)
NEUT%: 69.9 % (ref 38.4–76.8)
Platelets: 61 10*3/uL — ABNORMAL LOW (ref 145–400)
RBC: 3.9 10*6/uL (ref 3.70–5.45)
RDW: 13.5 % (ref 11.2–14.5)
WBC: 7.1 10*3/uL (ref 3.9–10.3)
lymph#: 1 10*3/uL (ref 0.9–3.3)

## 2016-06-21 MED ORDER — ROMIPLOSTIM INJECTION 500 MCG
5.9500 ug/kg | Freq: Once | SUBCUTANEOUS | Status: AC
  Administered 2016-06-21: 310 ug via SUBCUTANEOUS
  Filled 2016-06-21: qty 0.62

## 2016-06-21 NOTE — Patient Instructions (Signed)
Romiplostim injection What is this medicine? ROMIPLOSTIM (roe mi PLOE stim) helps your body make more platelets. This medicine is used to treat low platelets caused by chronic idiopathic thrombocytopenic purpura (ITP). This medicine may be used for other purposes; ask your health care provider or pharmacist if you have questions. What should I tell my health care provider before I take this medicine? They need to know if you have any of these conditions: -cancer or myelodysplastic syndrome -low blood counts, like low white cell, platelet, or red cell counts -take medicines that treat or prevent blood clots -an unusual or allergic reaction to romiplostim, mannitol, other medicines, foods, dyes, or preservatives -pregnant or trying to get pregnant -breast-feeding How should I use this medicine? This medicine is for injection under the skin. It is given by a health care professional in a hospital or clinic setting. A special MedGuide will be given to you before your injection. Read this information carefully each time. Talk to your pediatrician regarding the use of this medicine in children. Special care may be needed. Overdosage: If you think you have taken too much of this medicine contact a poison control center or emergency room at once. NOTE: This medicine is only for you. Do not share this medicine with others. What if I miss a dose? It is important not to miss your dose. Call your doctor or health care professional if you are unable to keep an appointment. What may interact with this medicine? Interactions are not expected. This list may not describe all possible interactions. Give your health care provider a list of all the medicines, herbs, non-prescription drugs, or dietary supplements you use. Also tell them if you smoke, drink alcohol, or use illegal drugs. Some items may interact with your medicine. What should I watch for while using this medicine? Your condition will be monitored  carefully while you are receiving this medicine. Visit your prescriber or health care professional for regular checks on your progress and for the needed blood tests. It is important to keep all appointments. What side effects may I notice from receiving this medicine? Side effects that you should report to your doctor or health care professional as soon as possible: -allergic reactions like skin rash, itching or hives, swelling of the face, lips, or tongue -shortness of breath, chest pain, swelling in a leg -unusual bleeding or bruising Side effects that usually do not require medical attention (report to your doctor or health care professional if they continue or are bothersome): -dizziness -headache -muscle aches -pain in arms and legs -stomach pain -trouble sleeping This list may not describe all possible side effects. Call your doctor for medical advice about side effects. You may report side effects to FDA at 1-800-FDA-1088. Where should I keep my medicine? This drug is given in a hospital or clinic and will not be stored at home. NOTE: This sheet is a summary. It may not cover all possible information. If you have questions about this medicine, talk to your doctor, pharmacist, or health care provider.    2016, Elsevier/Gold Standard. (2008-03-07 15:13:04)  

## 2016-06-21 NOTE — Telephone Encounter (Signed)
Appointments scheduled per 12/1 LOS. Patient given AVS report and calendars with future scheduled appointments. °

## 2016-06-25 ENCOUNTER — Other Ambulatory Visit: Payer: Self-pay | Admitting: Emergency Medicine

## 2016-06-29 NOTE — Telephone Encounter (Signed)
Called patients niece Anne Ng, patient is due for follow up. She has voiced understanding

## 2016-07-10 ENCOUNTER — Other Ambulatory Visit (HOSPITAL_COMMUNITY): Payer: Medicare Other

## 2016-07-19 ENCOUNTER — Other Ambulatory Visit (HOSPITAL_BASED_OUTPATIENT_CLINIC_OR_DEPARTMENT_OTHER): Payer: Medicare Other

## 2016-07-19 ENCOUNTER — Ambulatory Visit (HOSPITAL_BASED_OUTPATIENT_CLINIC_OR_DEPARTMENT_OTHER): Payer: Medicare Other

## 2016-07-19 VITALS — BP 138/74 | HR 69 | Temp 98.1°F | Resp 18

## 2016-07-19 DIAGNOSIS — D693 Immune thrombocytopenic purpura: Secondary | ICD-10-CM

## 2016-07-19 LAB — CBC WITH DIFFERENTIAL/PLATELET
BASO%: 0.3 % (ref 0.0–2.0)
Basophils Absolute: 0 10*3/uL (ref 0.0–0.1)
EOS ABS: 0.3 10*3/uL (ref 0.0–0.5)
EOS%: 5.8 % (ref 0.0–7.0)
HCT: 37 % (ref 34.8–46.6)
HEMOGLOBIN: 12.5 g/dL (ref 11.6–15.9)
LYMPH%: 12.7 % — AB (ref 14.0–49.7)
MCH: 33.5 pg (ref 25.1–34.0)
MCHC: 33.7 g/dL (ref 31.5–36.0)
MCV: 99.4 fL (ref 79.5–101.0)
MONO#: 0.4 10*3/uL (ref 0.1–0.9)
MONO%: 7.8 % (ref 0.0–14.0)
NEUT%: 73.4 % (ref 38.4–76.8)
NEUTROS ABS: 3.8 10*3/uL (ref 1.5–6.5)
Platelets: 65 10*3/uL — ABNORMAL LOW (ref 145–400)
RBC: 3.72 10*6/uL (ref 3.70–5.45)
RDW: 13.7 % (ref 11.2–14.5)
WBC: 5.2 10*3/uL (ref 3.9–10.3)
lymph#: 0.7 10*3/uL — ABNORMAL LOW (ref 0.9–3.3)

## 2016-07-19 MED ORDER — ROMIPLOSTIM INJECTION 500 MCG
310.0000 ug | Freq: Once | SUBCUTANEOUS | Status: AC
  Administered 2016-07-19: 310 ug via SUBCUTANEOUS
  Filled 2016-07-19: qty 0.62

## 2016-07-19 NOTE — Patient Instructions (Signed)
Romiplostim injection What is this medicine? ROMIPLOSTIM (roe mi PLOE stim) helps your body make more platelets. This medicine is used to treat low platelets caused by chronic idiopathic thrombocytopenic purpura (ITP). This medicine may be used for other purposes; ask your health care provider or pharmacist if you have questions. What should I tell my health care provider before I take this medicine? They need to know if you have any of these conditions: -cancer or myelodysplastic syndrome -low blood counts, like low white cell, platelet, or red cell counts -take medicines that treat or prevent blood clots -an unusual or allergic reaction to romiplostim, mannitol, other medicines, foods, dyes, or preservatives -pregnant or trying to get pregnant -breast-feeding How should I use this medicine? This medicine is for injection under the skin. It is given by a health care professional in a hospital or clinic setting. A special MedGuide will be given to you before your injection. Read this information carefully each time. Talk to your pediatrician regarding the use of this medicine in children. Special care may be needed. Overdosage: If you think you have taken too much of this medicine contact a poison control center or emergency room at once. NOTE: This medicine is only for you. Do not share this medicine with others. What if I miss a dose? It is important not to miss your dose. Call your doctor or health care professional if you are unable to keep an appointment. What may interact with this medicine? Interactions are not expected. This list may not describe all possible interactions. Give your health care provider a list of all the medicines, herbs, non-prescription drugs, or dietary supplements you use. Also tell them if you smoke, drink alcohol, or use illegal drugs. Some items may interact with your medicine. What should I watch for while using this medicine? Your condition will be monitored  carefully while you are receiving this medicine. Visit your prescriber or health care professional for regular checks on your progress and for the needed blood tests. It is important to keep all appointments. What side effects may I notice from receiving this medicine? Side effects that you should report to your doctor or health care professional as soon as possible: -allergic reactions like skin rash, itching or hives, swelling of the face, lips, or tongue -shortness of breath, chest pain, swelling in a leg -unusual bleeding or bruising Side effects that usually do not require medical attention (report to your doctor or health care professional if they continue or are bothersome): -dizziness -headache -muscle aches -pain in arms and legs -stomach pain -trouble sleeping This list may not describe all possible side effects. Call your doctor for medical advice about side effects. You may report side effects to FDA at 1-800-FDA-1088. Where should I keep my medicine? This drug is given in a hospital or clinic and will not be stored at home. NOTE: This sheet is a summary. It may not cover all possible information. If you have questions about this medicine, talk to your doctor, pharmacist, or health care provider.    2016, Elsevier/Gold Standard. (2008-03-07 15:13:04)  

## 2016-07-28 ENCOUNTER — Other Ambulatory Visit: Payer: Self-pay | Admitting: Physician Assistant

## 2016-07-31 ENCOUNTER — Other Ambulatory Visit (HOSPITAL_COMMUNITY): Payer: Medicare Other

## 2016-08-14 ENCOUNTER — Ambulatory Visit (HOSPITAL_COMMUNITY): Payer: Medicare Other | Attending: Cardiology

## 2016-08-14 ENCOUNTER — Other Ambulatory Visit: Payer: Self-pay

## 2016-08-14 DIAGNOSIS — I5022 Chronic systolic (congestive) heart failure: Secondary | ICD-10-CM | POA: Diagnosis not present

## 2016-08-14 DIAGNOSIS — I351 Nonrheumatic aortic (valve) insufficiency: Secondary | ICD-10-CM | POA: Diagnosis not present

## 2016-08-16 ENCOUNTER — Ambulatory Visit: Payer: Medicare Other

## 2016-08-16 ENCOUNTER — Telehealth: Payer: Self-pay | Admitting: Hematology

## 2016-08-16 ENCOUNTER — Other Ambulatory Visit: Payer: Medicare Other

## 2016-08-16 NOTE — Telephone Encounter (Signed)
Appointments rescheduled per patient request. Patient's caregiver cannot bring patient to appointment. Requested to have early am appointment on the following Friday.

## 2016-08-23 ENCOUNTER — Ambulatory Visit (HOSPITAL_BASED_OUTPATIENT_CLINIC_OR_DEPARTMENT_OTHER): Payer: Medicare Other

## 2016-08-23 ENCOUNTER — Other Ambulatory Visit (HOSPITAL_BASED_OUTPATIENT_CLINIC_OR_DEPARTMENT_OTHER): Payer: Medicare Other

## 2016-08-23 VITALS — BP 170/83 | HR 77 | Temp 97.6°F | Resp 20

## 2016-08-23 DIAGNOSIS — D693 Immune thrombocytopenic purpura: Secondary | ICD-10-CM

## 2016-08-23 LAB — CBC WITH DIFFERENTIAL/PLATELET
BASO%: 0.4 % (ref 0.0–2.0)
Basophils Absolute: 0 10*3/uL (ref 0.0–0.1)
EOS ABS: 0.5 10*3/uL (ref 0.0–0.5)
EOS%: 9.4 % — ABNORMAL HIGH (ref 0.0–7.0)
HEMATOCRIT: 37.4 % (ref 34.8–46.6)
HGB: 13.5 g/dL (ref 11.6–15.9)
LYMPH%: 18.2 % (ref 14.0–49.7)
MCH: 33.3 pg (ref 25.1–34.0)
MCHC: 36.1 g/dL — AB (ref 31.5–36.0)
MCV: 92.3 fL (ref 79.5–101.0)
MONO#: 0.7 10*3/uL (ref 0.1–0.9)
MONO%: 12.5 % (ref 0.0–14.0)
NEUT#: 3.2 10*3/uL (ref 1.5–6.5)
NEUT%: 59.5 % (ref 38.4–76.8)
PLATELETS: 38 10*3/uL — AB (ref 145–400)
RBC: 4.05 10*6/uL (ref 3.70–5.45)
RDW: 13.3 % (ref 11.2–14.5)
WBC: 5.4 10*3/uL (ref 3.9–10.3)
lymph#: 1 10*3/uL (ref 0.9–3.3)
nRBC: 0 % (ref 0–0)

## 2016-08-23 MED ORDER — ROMIPLOSTIM INJECTION 500 MCG
360.0000 ug | Freq: Once | SUBCUTANEOUS | Status: AC
  Administered 2016-08-23: 360 ug via SUBCUTANEOUS
  Filled 2016-08-23: qty 0.72

## 2016-08-23 NOTE — Patient Instructions (Signed)
Romiplostim injection What is this medicine? ROMIPLOSTIM (roe mi PLOE stim) helps your body make more platelets. This medicine is used to treat low platelets caused by chronic idiopathic thrombocytopenic purpura (ITP). This medicine may be used for other purposes; ask your health care provider or pharmacist if you have questions. What should I tell my health care provider before I take this medicine? They need to know if you have any of these conditions: -cancer or myelodysplastic syndrome -low blood counts, like low white cell, platelet, or red cell counts -take medicines that treat or prevent blood clots -an unusual or allergic reaction to romiplostim, mannitol, other medicines, foods, dyes, or preservatives -pregnant or trying to get pregnant -breast-feeding How should I use this medicine? This medicine is for injection under the skin. It is given by a health care professional in a hospital or clinic setting. A special MedGuide will be given to you before your injection. Read this information carefully each time. Talk to your pediatrician regarding the use of this medicine in children. Special care may be needed. Overdosage: If you think you have taken too much of this medicine contact a poison control center or emergency room at once. NOTE: This medicine is only for you. Do not share this medicine with others. What if I miss a dose? It is important not to miss your dose. Call your doctor or health care professional if you are unable to keep an appointment. What may interact with this medicine? Interactions are not expected. This list may not describe all possible interactions. Give your health care provider a list of all the medicines, herbs, non-prescription drugs, or dietary supplements you use. Also tell them if you smoke, drink alcohol, or use illegal drugs. Some items may interact with your medicine. What should I watch for while using this medicine? Your condition will be monitored  carefully while you are receiving this medicine. Visit your prescriber or health care professional for regular checks on your progress and for the needed blood tests. It is important to keep all appointments. What side effects may I notice from receiving this medicine? Side effects that you should report to your doctor or health care professional as soon as possible: -allergic reactions like skin rash, itching or hives, swelling of the face, lips, or tongue -shortness of breath, chest pain, swelling in a leg -unusual bleeding or bruising Side effects that usually do not require medical attention (report to your doctor or health care professional if they continue or are bothersome): -dizziness -headache -muscle aches -pain in arms and legs -stomach pain -trouble sleeping This list may not describe all possible side effects. Call your doctor for medical advice about side effects. You may report side effects to FDA at 1-800-FDA-1088. Where should I keep my medicine? This drug is given in a hospital or clinic and will not be stored at home. NOTE: This sheet is a summary. It may not cover all possible information. If you have questions about this medicine, talk to your doctor, pharmacist, or health care provider.    2016, Elsevier/Gold Standard. (2008-03-07 15:13:04)  

## 2016-09-13 ENCOUNTER — Telehealth: Payer: Self-pay | Admitting: Hematology

## 2016-09-13 ENCOUNTER — Other Ambulatory Visit: Payer: Medicare Other

## 2016-09-13 ENCOUNTER — Ambulatory Visit: Payer: Medicare Other

## 2016-09-13 ENCOUNTER — Ambulatory Visit: Payer: Medicare Other | Admitting: Hematology

## 2016-09-13 NOTE — Telephone Encounter (Signed)
lvm to inform of r/s appt 3/2 at 0915 per LOS

## 2016-09-20 ENCOUNTER — Telehealth: Payer: Self-pay | Admitting: *Deleted

## 2016-09-20 ENCOUNTER — Telehealth: Payer: Self-pay | Admitting: Hematology

## 2016-09-20 ENCOUNTER — Ambulatory Visit: Payer: Medicare Other | Admitting: Hematology

## 2016-09-20 ENCOUNTER — Other Ambulatory Visit: Payer: Medicare Other

## 2016-09-20 ENCOUNTER — Ambulatory Visit: Payer: Medicare Other

## 2016-09-20 NOTE — Telephone Encounter (Signed)
Added lab/fu/inj for 3/5 @ 9 am for lab, 9:30 am for YF and 12 pm for injection. Date/time/relatice aware per 3/2 sch msg. Desk aware injection could not be coordinated with f/u.

## 2016-09-20 NOTE — Telephone Encounter (Signed)
-----   Message from Truitt Merle, MD sent at 09/20/2016 12:55 PM EST ----- Meredeth Ide,  I am worried about her, please call her and find out what's going on  Thanks  Krista Blue  ----- Message ----- From: Patton Salles, RN Sent: 09/20/2016  12:48 PM To: Jarvis Morgan, RN, Truitt Merle, MD  Ms Amend was a "no show" for her N-plate injection today

## 2016-09-20 NOTE — Telephone Encounter (Signed)
Pt was NO SHOW for appts today.  Spoke with niece Laqueta Jean, and was informed that caregiver had called office this am to reschedule appts due to pt had upset stomach today.  No notes available of the call.   Loma Newton appts date and time for Monday 09/23/16.  Anne Ng stated she would relay message to caregiver.

## 2016-09-23 ENCOUNTER — Other Ambulatory Visit: Payer: Medicare Other

## 2016-09-23 ENCOUNTER — Ambulatory Visit: Payer: Medicare Other | Admitting: Cardiovascular Disease

## 2016-09-23 ENCOUNTER — Ambulatory Visit: Payer: Medicare Other

## 2016-09-23 ENCOUNTER — Ambulatory Visit: Payer: Medicare Other | Admitting: Hematology

## 2016-09-26 NOTE — Progress Notes (Signed)
[facility]  Telephone:(336) [phone] Fax:(336) [phone]  Clinic Follow Up Note   Patient Care Team: Merrilee Seashore, MD as PCP - General (Internal Medicine) Truitt Merle, MD as Consulting Physician (Hematology) 09/27/2016  CHIEF COMPLAINTS:  Follow up ITP   CURRENT THERAPY: Nplate 60mg/kg every 3-4 weeks to maintain plt>50K   HISTORY OF PRESENTING ILLNESS (06/21/2016):  Lisa Drafts83y.o. female was previously seen for immune thrombocytopenia care by Dr. EMarin Olp Her thrombocytopenia is well managed with Nplate. She received Nplate every 4 to 5 weeks. Her last visit and injection in his office was on 05/15/2016.   The patient is here to establish care because our office is more convenient for transportation. She is accompanied by her caregiver. She does not recall how long she has had platelet management care. She does not recall if she took steroids in the past. She denies gum bleeding and has false teeth. She denies nose bleeding, hematuria, or blood in stool. She denies any pain, bowel issue, or breathing issue.   She lives in independent living. Her care giver notes the patient "eats like a bird". She does eat a particularly well rounded diet. Her care giver has provided her with Boost/Ensure to help maintain her weight instead of sodas. The patient enjoys these drinks. Her care giver works with her 7 days a week.   She recently fell at her niece's house at Thanksgiving and twisted her ankle. This is healing well. She also fell at her cardiologist's office on 06/18/16 and hit her head on a water fountain. She often falls at the doctor's office. She recently began using a cane to ambulate. She does not use a walker.   She does not smoke or drink.   Interval History: The patient returns for follow up. She has had to reschedule appointment a few times in the past 2 months due to her transportation issues. Patient is accompanied by a caregiver who helps give a lot of  her information. Her platelet count is good, at 58 today. She denies any bleeding. Patient's friend is concerned about her appetite. Does not want her to lose excessive weight. She has not been sleeping well during the night, says she sleeps more during the day. Patient likes to walk around but that is limited because of the weather.   MEDICAL HISTORY:  Past Medical History:  Diagnosis Date  . Acute MI, lateral wall ([place]   . Aortic stenosis   . Chronic kidney disease   . Chronic systolic heart failure (HGarc[place]   . Coronary artery disease    sees Dr KClaiborne Billingsevery 6 months  . DM (diabetes mellitus) ([facility]    type 2 ; diet controlled  . HTN (hypertension)   . Immune thrombocytopenic purpura ([facility] 01/21/2013  . Ischemic heart disease   . Mitral regurgitation   . Non Hodgkin's lymphoma ([facility]    of the throat  . Subdural hematoma, post-traumatic (HY[place] 2015   sees Dr NSherwood Gamblerevery 6 months  . Thrombocytopenia (H[place]    sees Dr EMarin Olpevery 2 weeks ;receives Nplate prn    SURGICAL HISTORY: Past Surgical History:  Procedure Laterality Date  . BONE MARROW BIOPSY  11/22/2002   left posterior iliac crest bone marrow biopsy and aspirate  . CATARACT EXTRACTION W/ INTRAOCULAR LENS  IMPLANT, BILATERAL Bilateral   . CORONARY ARTERY BYPASS GRAFT  11/20/2007   x5  . EYE SURGERY    . submental lymph node excisional biopsy  10/22/2002  SOCIAL HISTORY: Social History   Social History  . Marital status: Widowed    Spouse name: N/A  . Number of children: N/A  . Years of education: N/A   Occupational History  . Not on file.   Social History Main Topics  . Smoking status: Never Smoker  . Smokeless tobacco: Never Used     Comment: never used tobacco.  . Alcohol use No  . Drug use: No  . Sexual activity: No   Other Topics Concern  . Not on file   Social History Narrative  . No narrative on file    FAMILY HISTORY: Family History  Problem Relation Age of Onset  . Heart attack Father   .  Diabetes Father     ALLERGIES:  is allergic to ace inhibitors.  MEDICATIONS:  Current Outpatient Prescriptions  Medication Sig Dispense Refill  . atorvastatin (LIPITOR) 20 MG tablet TAKE 1 TABLET (20 MG TOTAL) BY MOUTH DAILY. 30 tablet 0  . Calcium Carbonate-Vitamin D (CALCIUM-VITAMIN D) 500-200 MG-UNIT per tablet Take 1 tablet by mouth daily.     . carvedilol (COREG) 6.25 MG tablet Take 1 tablet (6.25 mg total) by mouth 2 (two) times daily with a meal. 180 tablet 1  . Cholecalciferol (VITAMIN D) 1000 UNITS capsule Take 1,000 Units by mouth at bedtime.     . fluticasone (FLONASE) 50 MCG/ACT nasal spray Place 2 sprays into both nostrils daily.    . irbesartan (AVAPRO) 300 MG tablet Take 1 tablet (300 mg total) by mouth daily. 30 tablet 3  . KLOR-CON M20 20 MEQ tablet TAKE 1 TABLET EVERY DAY 90 tablet 3  . Multiple Vitamins-Minerals (PRESERVISION AREDS PO) Take 1 tablet by mouth daily.    . mupirocin ointment (BACTROBAN) 2 % Apply small amount to surgical area left ear twice a day 22 g 0  . romiPLOStim (NPLATE) 250 MCG injection Inject 1 mcg/kg into the skin as needed (every 2-3 weeks for low platelets). Given @ Oncology    . sertraline (ZOLOFT) 25 MG tablet TAKE 1 TABLET BY MOUTH EVERY DAY 30 tablet 0  . spironolactone (ALDACTONE) 25 MG tablet Take 25 mg by mouth daily.     No current facility-administered medications for this visit.     REVIEW OF SYSTEMS:  Constitutional: Denies fevers, chills or abnormal night sweats (+) decreased appetite Eyes: Denies blurriness of vision, double vision or watery eyes Ears, nose, mouth, throat, and face: Denies mucositis or sore throat Respiratory: Denies cough, dyspnea or wheezes Cardiovascular: Denies palpitation, chest discomfort or lower extremity swelling Gastrointestinal:  Denies nausea, heartburn or change in bowel habits Skin: Denies abnormal skin rashes Lymphatics: Denies new lymphadenopathy or easy bruising Musculoskeletal: (+)  falls Neurological:Denies numbness, tingling or new weaknesses Behavioral/Psych: Mood is stable, no new changes  All other systems were reviewed with the patient and are negative.  PHYSICAL EXAMINATION:  ECOG PERFORMANCE STATUS: 3 - Symptomatic, >50% confined to bed  Vitals:   09/27/16 1154  BP: (!) 159/82  Pulse: (!) 56  Resp: 17  Temp: 98.3 F (36.8 C)   Filed Weights   09/27/16 1154  Weight: 111 lb 8 oz (50.6 kg)    GENERAL:alert, no distress and comfortable. Able to get on exam table with assistance. Uses cane to ambulate. SKIN: skin color, texture, turgor are normal, no rashes or significant lesions EYES: normal, conjunctiva are pink and non-injected, sclera clear OROPHARYNX:no exudate, no erythema and lips, buccal mucosa, and tongue normal  NECK: supple, thyroid normal  size, non-tender, without nodularity LYMPH:  no palpable lymphadenopathy in the cervical, axillary or inguinal LUNGS: clear to auscultation and percussion with normal breathing effort HEART: regular rate & rhythm and no murmurs and no lower extremity edema ABDOMEN:abdomen soft, non-tender and normal bowel sounds Musculoskeletal:no cyanosis of digits and no clubbing   PSYCH: alert & oriented x 3 with fluent speech NEURO: no focal motor/sensory deficits   LABORATORY DATA:  I have reviewed the data as listed CBC Latest Ref Rng & Units 09/27/2016 08/23/2016 07/19/2016  WBC 3.9 - 10.3 10e3/uL 7.8 5.4 5.2  Hemoglobin 11.6 - 15.9 g/dL 13.6 13.5 12.5  Hematocrit 34.8 - 46.6 % 40.1 37.4 37.0  Platelets 145 - 400 10e3/uL 53(L) 38(L) 65(L)   CMP Latest Ref Rng & Units 09/27/2016 06/21/2016 04/15/2016  Glucose 70 - 140 mg/dl 114 128 115(H)  BUN 7.0 - 26.0 mg/dL 27.5(H) 17.6 28(H)  Creatinine 0.6 - 1.1 mg/dL 1.3(H) 1.5(H) 1.30(H)  Sodium 136 - 145 mEq/L 138 139 140  Potassium 3.5 - 5.1 mEq/L 4.7 4.4 4.4  Chloride 98 - 110 mmol/L - - 104  CO2 22 - 29 mEq/L '26 27 27  ' Calcium 8.4 - 10.4 mg/dL 9.8 9.7 9.8  Total Protein  6.4 - 8.3 g/dL 6.3(L) 6.7 5.9(L)  Total Bilirubin 0.20 - 1.20 mg/dL 1.26(H) 1.71(H) 1.1  Alkaline Phos 40 - 150 U/L 85 110 71  AST 5 - 34 U/L '29 25 21  ' ALT 0 - 55 U/L '24 18 12   ' PATHOLOGY REPORT  Diagnosis 02/25/2013 Bone Marrow Biopsy, left - NORMOCELLULAR MARROW WITH TRILINEAGE HEMATOPOIESIS AND MATURATION. - NO MORPHOLOGIC EVIDENCE OF CLONAL STEM CELL DISORDER OR LYMPHOMA. - SEE COMMENT. PERIPHERAL BLOOD: - THROMBOCYTOPENIA. Diagnosis Note The bone marrow is largely unremarkable. There is no morphologic evidence of a clonal stem cell process or the patient's prior lymphoma. Compared to the patient's previous bone marrow ([id number]), there has been a mild decrease in cellularity and number of megakaryocytes. There is no morphologic etiology for the patient's progressive thrombocytopenia in the current marrow.  RADIOGRAPHIC STUDIES: I have personally reviewed the radiological images as listed and agreed with the findings in the report. No results found.  ASSESSMENT & PLAN: 83 y.o. female   1. Idiopathic Thrombocytopenia Purpura (ITP) -I previously reviewed her outside records extensively including prior bone marrow biopsy -She has no abnormal bleeding or bruising -Thrombocytopenia has been well managed with Nplate. She previously received Nplate injections every 4 to 5 weeks. Her plt sometime in 30's. She also frequently rescheduleS her appointment due to her transportation issue.  -I'll schedule her for lab and injection every 3 weeks to keep her plt>50K -Labs reviewed, plt 53K today, will continue Nplate injection  -She will contact our office if she has any abnormal bruising or bleeding  2. Decreased appetite -Encouraged the patient to add Glucerna to her diet to avoid weight loss. - Patient's caregiver is worried about her low appetite. I have suggested drinking more of a dietary supplement, Boost, to help with this, I encouraged her to go to dinning room and do not ski meals    3. Hypertension, DM -She will follow with her primary care physician -She follows with cardiologist Dr. Claiborne Billings  4. Falls -She began using a cane to ambulate after recent falls -previously encouraged the patient to begin using a walker to avoid future falls  5. Dementia -Her primary care physician has started her on medication for dementia, but both her knees and caregiver are concerned  about the side effect from the medication, and has not been giving her.  Follow up: - lab & injection every 3 weeks x3 months - f/u in 12 weeks - cancel 2/23 appointments --She will contact our office if she has any abnormal bruising or bleeding  No orders of the defined types were placed in this encounter.   All questions were answered. The patient knows to call the clinic with any problems, questions or concerns.  I spent 20 minutes counseling the patient face to face. The total time spent in the appointment was 25 minutes and more than 50% was on counseling.  This document serves as a record of services personally performed by Truitt Merle, MD. It was created on her behalf by Maryla Morrow, a trained medical scribe. The creation of this record is based on the scribe's personal observations and the provider's statements to them. This document has been checked and approved by the attending provider.     Truitt Merle, MD 09/27/2016

## 2016-09-27 ENCOUNTER — Other Ambulatory Visit (HOSPITAL_BASED_OUTPATIENT_CLINIC_OR_DEPARTMENT_OTHER): Payer: Medicare Other

## 2016-09-27 ENCOUNTER — Ambulatory Visit (HOSPITAL_BASED_OUTPATIENT_CLINIC_OR_DEPARTMENT_OTHER): Payer: Medicare Other

## 2016-09-27 ENCOUNTER — Ambulatory Visit (HOSPITAL_BASED_OUTPATIENT_CLINIC_OR_DEPARTMENT_OTHER): Payer: Medicare Other | Admitting: Hematology

## 2016-09-27 ENCOUNTER — Encounter: Payer: Self-pay | Admitting: Hematology

## 2016-09-27 VITALS — BP 159/82 | HR 56 | Temp 98.3°F | Resp 17 | Ht 60.0 in | Wt 111.5 lb

## 2016-09-27 DIAGNOSIS — N183 Chronic kidney disease, stage 3 unspecified: Secondary | ICD-10-CM

## 2016-09-27 DIAGNOSIS — E46 Unspecified protein-calorie malnutrition: Secondary | ICD-10-CM

## 2016-09-27 DIAGNOSIS — D693 Immune thrombocytopenic purpura: Secondary | ICD-10-CM

## 2016-09-27 DIAGNOSIS — I350 Nonrheumatic aortic (valve) stenosis: Secondary | ICD-10-CM

## 2016-09-27 DIAGNOSIS — I25810 Atherosclerosis of coronary artery bypass graft(s) without angina pectoris: Secondary | ICD-10-CM

## 2016-09-27 LAB — CBC WITH DIFFERENTIAL/PLATELET
BASO%: 0.7 % (ref 0.0–2.0)
Basophils Absolute: 0.1 10*3/uL (ref 0.0–0.1)
EOS%: 6.6 % (ref 0.0–7.0)
Eosinophils Absolute: 0.5 10*3/uL (ref 0.0–0.5)
HCT: 40.1 % (ref 34.8–46.6)
HGB: 13.6 g/dL (ref 11.6–15.9)
LYMPH%: 16.2 % (ref 14.0–49.7)
MCH: 33.4 pg (ref 25.1–34.0)
MCHC: 33.9 g/dL (ref 31.5–36.0)
MCV: 98.7 fL (ref 79.5–101.0)
MONO#: 0.5 10*3/uL (ref 0.1–0.9)
MONO%: 6.5 % (ref 0.0–14.0)
NEUT#: 5.5 10*3/uL (ref 1.5–6.5)
NEUT%: 70 % (ref 38.4–76.8)
Platelets: 53 10*3/uL — ABNORMAL LOW (ref 145–400)
RBC: 4.06 10*6/uL (ref 3.70–5.45)
RDW: 14.5 % (ref 11.2–14.5)
WBC: 7.8 10*3/uL (ref 3.9–10.3)
lymph#: 1.3 10*3/uL (ref 0.9–3.3)

## 2016-09-27 LAB — COMPREHENSIVE METABOLIC PANEL
ALT: 24 U/L (ref 0–55)
AST: 29 U/L (ref 5–34)
Albumin: 4.1 g/dL (ref 3.5–5.0)
Alkaline Phosphatase: 85 U/L (ref 40–150)
Anion Gap: 9 mEq/L (ref 3–11)
BILIRUBIN TOTAL: 1.26 mg/dL — AB (ref 0.20–1.20)
BUN: 27.5 mg/dL — ABNORMAL HIGH (ref 7.0–26.0)
CO2: 26 mEq/L (ref 22–29)
CREATININE: 1.3 mg/dL — AB (ref 0.6–1.1)
Calcium: 9.8 mg/dL (ref 8.4–10.4)
Chloride: 103 mEq/L (ref 98–109)
EGFR: 37 mL/min/{1.73_m2} — ABNORMAL LOW (ref >90)
GLUCOSE: 114 mg/dL (ref 70–140)
Potassium: 4.7 mEq/L (ref 3.5–5.1)
SODIUM: 138 meq/L (ref 136–145)
TOTAL PROTEIN: 6.3 g/dL — AB (ref 6.4–8.3)

## 2016-09-27 MED ORDER — ROMIPLOSTIM INJECTION 500 MCG
7.1000 ug/kg | Freq: Once | SUBCUTANEOUS | Status: AC
  Administered 2016-09-27: 360 ug via SUBCUTANEOUS
  Filled 2016-09-27: qty 0.72

## 2016-09-27 NOTE — Patient Instructions (Signed)
Romiplostim injection What is this medicine? ROMIPLOSTIM (roe mi PLOE stim) helps your body make more platelets. This medicine is used to treat low platelets caused by chronic idiopathic thrombocytopenic purpura (ITP). This medicine may be used for other purposes; ask your health care provider or pharmacist if you have questions. COMMON BRAND NAME(S): Nplate What should I tell my health care provider before I take this medicine? They need to know if you have any of these conditions: -cancer or myelodysplastic syndrome -low blood counts, like low white cell, platelet, or red cell counts -take medicines that treat or prevent blood clots -an unusual or allergic reaction to romiplostim, mannitol, other medicines, foods, dyes, or preservatives -pregnant or trying to get pregnant -breast-feeding How should I use this medicine? This medicine is for injection under the skin. It is given by a health care professional in a hospital or clinic setting. A special MedGuide will be given to you before your injection. Read this information carefully each time. Talk to your pediatrician regarding the use of this medicine in children. Special care may be needed. Overdosage: If you think you have taken too much of this medicine contact a poison control center or emergency room at once. NOTE: This medicine is only for you. Do not share this medicine with others. What if I miss a dose? It is important not to miss your dose. Call your doctor or health care professional if you are unable to keep an appointment. What may interact with this medicine? Interactions are not expected. This list may not describe all possible interactions. Give your health care provider a list of all the medicines, herbs, non-prescription drugs, or dietary supplements you use. Also tell them if you smoke, drink alcohol, or use illegal drugs. Some items may interact with your medicine. What should I watch for while using this  medicine? Your condition will be monitored carefully while you are receiving this medicine. Visit your prescriber or health care professional for regular checks on your progress and for the needed blood tests. It is important to keep all appointments. What side effects may I notice from receiving this medicine? Side effects that you should report to your doctor or health care professional as soon as possible: -allergic reactions like skin rash, itching or hives, swelling of the face, lips, or tongue -shortness of breath, chest pain, swelling in a leg -unusual bleeding or bruising Side effects that usually do not require medical attention (report to your doctor or health care professional if they continue or are bothersome): -dizziness -headache -muscle aches -pain in arms and legs -stomach pain -trouble sleeping This list may not describe all possible side effects. Call your doctor for medical advice about side effects. You may report side effects to FDA at 1-800-FDA-1088. Where should I keep my medicine? This drug is given in a hospital or clinic and will not be stored at home. NOTE: This sheet is a summary. It may not cover all possible information. If you have questions about this medicine, talk to your doctor, pharmacist, or health care provider.  2018 Elsevier/Gold Standard (2008-03-07 15:13:04)  

## 2016-09-29 ENCOUNTER — Telehealth: Payer: Self-pay | Admitting: Hematology

## 2016-09-29 NOTE — Telephone Encounter (Signed)
S/w pt's niece, advised 3/23 appt cx'd and next appt is 3/30 @ 10.15am. Advised her to have pt collect a calendar then.

## 2016-10-11 ENCOUNTER — Ambulatory Visit: Payer: Medicare Other

## 2016-10-11 ENCOUNTER — Other Ambulatory Visit: Payer: Medicare Other

## 2016-10-18 ENCOUNTER — Other Ambulatory Visit: Payer: Medicare Other

## 2016-10-18 ENCOUNTER — Ambulatory Visit: Payer: Medicare Other

## 2016-10-22 ENCOUNTER — Ambulatory Visit: Payer: Medicare Other | Admitting: Cardiovascular Disease

## 2016-10-22 ENCOUNTER — Encounter: Payer: Self-pay | Admitting: *Deleted

## 2016-10-25 ENCOUNTER — Encounter: Payer: Self-pay | Admitting: *Deleted

## 2016-10-28 ENCOUNTER — Ambulatory Visit (INDEPENDENT_AMBULATORY_CARE_PROVIDER_SITE_OTHER): Payer: Medicare Other | Admitting: Diagnostic Neuroimaging

## 2016-10-28 ENCOUNTER — Encounter: Payer: Self-pay | Admitting: Diagnostic Neuroimaging

## 2016-10-28 VITALS — BP 144/80 | HR 70 | Ht 60.0 in | Wt 108.0 lb

## 2016-10-28 DIAGNOSIS — F0390 Unspecified dementia without behavioral disturbance: Secondary | ICD-10-CM | POA: Diagnosis not present

## 2016-10-28 DIAGNOSIS — F03B0 Unspecified dementia, moderate, without behavioral disturbance, psychotic disturbance, mood disturbance, and anxiety: Secondary | ICD-10-CM

## 2016-10-28 NOTE — Progress Notes (Signed)
[facility]  PATIENT: EMERALD SHOR DOB: 06/04/1928  REFERRING CLINICIAN: A Ramachandran HISTORY FROM: patient  REASON FOR VISIT: new consult    HISTORICAL  CHIEF COMPLAINT:  Chief Complaint  Patient presents with  . NP  Ashby Dawes  . Dementia    worsening memory, on Namzaric had SE (agitation, increased memory).  Had 3 brothers and did not finish her education.Marland Kitchen      HISTORY OF PRESENT ILLNESS:   83 year old female here for evaluation of dementia. Patient is accompanied by her niece for this visit. Patient is not sure why she is here for this visit today. I reviewed referring notes and spoke with patient and her niece for this consultation. Patient does acknowledge mild memory loss issues. She is not sure of the name of this place, the name of the place where she lives. She's not sure of which city she was born in.  According to patient's niece agent has been having mild progressive memory loss since age 54 years old. Patient retired as an Environmental consultant on the school bus at age 31 years old. Around 2012 patient was diagnosed with subdural hematomas, managed conservatively. Patient then moved into [place] in 2015. Patient is in independent living and has a caregiver who checks in on her 7 days a week, 2-3 hours per day. Patient's niece also checks in on her throughout the week.  Patient has been having more problems with memory loss the last couple of years. She is doing to repeat herself, losing track of time and appointments, and having more trouble taking care of herself. Patient's day and nighttime schedule seem to be inverting. Otherwise she is active at [employer], participating in activities, attending meal times, but otherwise mainly spending time alone in her room.  Patient was tried on Namzaric medication but this caused abnormal mood, behavior and cognitive functioning, and therefore was stopped.  No other behavior  mood changes. No hallucinations. No agitation. No wandering. Patient no longer drives.   REVIEW OF SYSTEMS: Full 14 system review of systems performed and negative with exception of: Memory loss. Otherwise negative.  ALLERGIES: Allergies  Allergen Reactions  . Ace Inhibitors Cough  . Namzaric [Memantine Hcl-Donepezil Hcl]     Increased memory loss, agitation    HOME MEDICATIONS: Outpatient Medications Prior to Visit  Medication Sig Dispense Refill  . atorvastatin (LIPITOR) 20 MG tablet TAKE 1 TABLET (20 MG TOTAL) BY MOUTH DAILY. 30 tablet 0  . Calcium Carbonate-Vitamin D (CALCIUM-VITAMIN D) 500-200 MG-UNIT per tablet Take 1 tablet by mouth daily.     . carvedilol (COREG) 6.25 MG tablet Take 1 tablet (6.25 mg total) by mouth 2 (two) times daily with a meal. 180 tablet 1  . Cholecalciferol (VITAMIN D) 1000 UNITS capsule Take 1,000 Units by mouth at bedtime.     . fluticasone (FLONASE) 50 MCG/ACT nasal spray Place 2 sprays into both nostrils daily as needed.     . irbesartan (AVAPRO) 300 MG tablet Take 1 tablet (300 mg total) by mouth daily. 30 tablet 3  . KLOR-CON M20 20 MEQ tablet TAKE 1 TABLET EVERY DAY (Patient taking differently: TAKE 1 TABLET EVERY OTHER DAY) 90 tablet 3  . Multiple Vitamins-Minerals (PRESERVISION AREDS PO) Take 1 tablet by mouth daily.    . mupirocin ointment (BACTROBAN) 2 % Apply small amount to surgical area left ear twice a day (Patient taking differently: 2 (two) times daily as needed. Apply small amount to surgical area left ear  twice a day) 22 g 0  . romiPLOStim (NPLATE) 250 MCG injection Inject 1 mcg/kg into the skin as needed (every 2-3 weeks for low platelets). Given @ Oncology    . sertraline (ZOLOFT) 25 MG tablet TAKE 1 TABLET BY MOUTH EVERY DAY 30 tablet 0  . spironolactone (ALDACTONE) 25 MG tablet Take 25 mg by mouth daily.     No facility-administered medications prior to visit.     PAST MEDICAL HISTORY: Past Medical History:  Diagnosis Date  .  Acute MI, lateral wall ([place])   . Aortic stenosis   . Chronic kidney disease   . Chronic systolic heart failure ([place])   . Coronary artery disease    sees Dr Claiborne Billings every 6 months  . Dementia   . DM (diabetes mellitus) ([place])    type 2 ; diet controlled  . HTN (hypertension)   . Immune thrombocytopenic purpura ([place]) 01/21/2013  . Ischemic heart disease   . Kidney disease   . Mitral regurgitation   . Non Hodgkin's lymphoma ([place])    of the throat  . Subdural hematoma, post-traumatic ([place]) 2015   sees Dr Sherwood Gambler every 6 months  . Thrombocytopenia ([place])    sees Dr Marin Olp every 2 weeks ;receives Nplate prn    PAST SURGICAL HISTORY: Past Surgical History:  Procedure Laterality Date  . BONE MARROW BIOPSY  11/22/2002   left posterior iliac crest bone marrow biopsy and aspirate  . CATARACT EXTRACTION W/ INTRAOCULAR LENS  IMPLANT, BILATERAL Bilateral   . CORONARY ARTERY BYPASS GRAFT  11/20/2007   x5  . EYE SURGERY    . submental lymph node excisional biopsy  10/22/2002    FAMILY HISTORY: Family History  Problem Relation Age of Onset  . Heart attack Father   . Diabetes Father     SOCIAL HISTORY:  Social History   Social History  . Marital status: Widowed    Spouse name: N/A  . Number of children: 2  . Years of education: N/A   Occupational History  .      retired   Social History Main Topics  . Smoking status: Never Smoker  . Smokeless tobacco: Never Used     Comment: never used tobacco.  . Alcohol use No  . Drug use: No  . Sexual activity: No   Other Topics Concern  . Not on file   Social History Narrative   10/25/16 lives at [facility]  [phone]   Caffeine - sodas, 2-3 daily     PHYSICAL EXAM  GENERAL EXAM/CONSTITUTIONAL: Vitals:  Vitals:   10/28/16 1012  BP: (!) 144/80  Pulse: 70  Weight: 108 lb (49 kg)  Height: 5' (1.524 m)     Body mass index is 21.09 kg/m.  No exam data present  Patient is in no distress; well developed, nourished  and groomed; neck is supple  CARDIOVASCULAR:  Examination of carotid arteries is normal; no carotid bruits  Regular rate and rhythm, no murmurs  Examination of peripheral vascular system by observation and palpation is normal  EYES:  Ophthalmoscopic exam of optic discs and posterior segments is normal; no papilledema or hemorrhages  MUSCULOSKELETAL:  Gait, strength, tone, movements noted in Neurologic exam below  NEUROLOGIC: MENTAL STATUS:  MMSE - [place] Exam 10/28/2016  Orientation to time 1  Orientation to Place 2  Registration 3  Attention/ Calculation 0  Recall 0  Language- name 2 objects 2  Language- repeat 1  Language- follow 3 step  command 3  Language- read & follow direction 1  Write a sentence 0  Copy design 0  Total score 13    awake, alert, oriented to person; NOT PLACE OR TIME  DECR memory   DECR attention and concentration  language fluent, comprehension intact, naming intact,   fund of knowledge appropriate  HAPPY, JOVIAL, SMILING  CRANIAL NERVE:   2nd - no papilledema on fundoscopic exam  2nd, 3rd, 4th, 6th - pupils equal and reactive to light, visual fields full to confrontation, extraocular muscles intact, no nystagmus  5th - facial sensation symmetric  7th - facial strength symmetric  8th - hearing intact  9th - palate elevates symmetrically, uvula midline  11th - shoulder shrug symmetric  12th - tongue protrusion midline  MOTOR:   normal bulk and tone, full strength in the BUE, BLE  SENSORY:   normal and symmetric to light touch, temperature, vibration  COORDINATION:   finger-nose-finger, fine finger movements normal  REFLEXES:   deep tendon reflexes TRACE and symmetric  GAIT/STATION:   narrow based gait; STOOPED POSTURE; SLIGHTLY UNSTEADY    DIAGNOSTIC DATA (LABS, IMAGING, TESTING) - I reviewed patient records, labs, notes, testing and imaging myself where available.  Lab Results  Component Value  Date   WBC 7.8 09/27/2016   HGB 13.6 09/27/2016   HCT 40.1 09/27/2016   MCV 98.7 09/27/2016   PLT 53 (L) 09/27/2016      Component Value Date/Time   NA 138 09/27/2016 1103   K 4.7 09/27/2016 1103   CL 104 04/15/2016 1153   CL 103 01/21/2013 1157   CO2 26 09/27/2016 1103   GLUCOSE 114 09/27/2016 1103   GLUCOSE 123 (H) 01/21/2013 1157   BUN 27.5 (H) 09/27/2016 1103   CREATININE 1.3 (H) 09/27/2016 1103   CALCIUM 9.8 09/27/2016 1103   PROT 6.3 (L) 09/27/2016 1103   ALBUMIN 4.1 09/27/2016 1103   AST 29 09/27/2016 1103   ALT 24 09/27/2016 1103   ALKPHOS 85 09/27/2016 1103   BILITOT 1.26 (H) 09/27/2016 1103   GFRNONAA 30 (L) 07/04/2015 1211   GFRAA 34 (L) 07/04/2015 1211   Lab Results  Component Value Date   CHOL 117 09/08/2014   HDL 25 (L) 09/08/2014   LDLCALC 47 09/08/2014   LDLDIRECT 64.4 06/20/2009   TRIG 224 (H) 09/08/2014   CHOLHDL 4.7 09/08/2014   Lab Results  Component Value Date   HGBA1C 6.7 10/05/2015   Lab Results  Component Value Date   VITAMINB12 284 10/28/2013   Lab Results  Component Value Date   TSH 4.524 (H) 05/13/2015    08/22/15 CT head [I reviewed images myself and agree with interpretation. -VRP]  - Chronic calcified left subdural hematoma unchanged with 6 mm midline shift. No new findings since prior imaging.  05/12/15 MRI brain [I reviewed images myself and agree with interpretation. -VRP]  1. No acute intracranial infarct or other process identified. 2. Large complex calcifying chronic left subdural hematoma measuring up to 3 cm in maximal thickness with associated midline shift of 6 mm, similar to previous studies. Smaller anterior left frontal subdural collection also unchanged. 3. Small chronic subdural hygroma overlying the right cerebral convexity measuring up to 4 mm without mass effect. 4. Atrophy with chronic microvascular ischemic disease.     ASSESSMENT AND PLAN  83 y.o. year old female here with moderate dementia without  behavior disturbance. Patient living at [facility] independent living with caregiver support.   Dx:  1. Moderate dementia  without behavioral disturbance     PLAN: - continue supportive care - increase safety and supervision as needed - no need for additional testing at this time - no role for cholinergic or memantine medication at this time  Return if symptoms worsen or fail to improve, for return to PCP.    Penni Bombard, MD 09/24/755, 32:25 AM Certified in Neurology, Neurophysiology and Neuroimaging  [facility] [address], [place], [place] [address] [phone]

## 2016-10-28 NOTE — Patient Instructions (Signed)
Thank you for coming to see [place] at [facility]. I hope we have been able to provide you high quality care today.  You may receive a patient satisfaction survey over the next few weeks. We would appreciate your feedback and comments so that we may continue to improve ourselves and the health of our patients.  - visit [email or web address] website for more information   ~~~~~~~~~~~~~~~~~~~~~~~~~~~~~~~~~~~~~~~~~~~~~~~~~~~~~~~~~~~~~~~~~  DR. PENUMALLI'S GUIDE TO HAPPY AND HEALTHY LIVING These are some of my general health and wellness recommendations. Some of them may apply to you better than others. Please use common sense as you try these suggestions and feel free to ask me any questions.   ACTIVITY/FITNESS Mental, social, emotional and physical stimulation are very important for brain and body health. Try learning a new activity (arts, music, language, sports, games).  Keep moving your body to the best of your abilities. You can do this at home, inside or outside, the park, community center, gym or anywhere you like. Consider a physical therapist or personal trainer to get started. Consider the app Sworkit. Fitness trackers such as smart-watches, smart-phones or Fitbits can help as well.   NUTRITION Eat more plants: colorful vegetables, nuts, seeds and berries.  Eat less sugar, salt, preservatives and processed foods.  Avoid toxins such as cigarettes and alcohol.  Drink water when you are thirsty. Warm water with a slice of lemon is an excellent morning drink to start the day.  Consider these websites for more information The Nutrition Source (https://www.henry-hernandez.biz/) Precision Nutrition (WindowBlog.ch)   RELAXATION Consider practicing mindfulness meditation or other relaxation techniques such as deep breathing, prayer, yoga, tai chi, massage. See website mindful.org or the apps Headspace or Calm to help get  started.   SLEEP Try to get at least 7-8+ hours sleep per day. Regular exercise and reduced caffeine will help you sleep better. Practice good sleep hygeine techniques. See website sleep.org for more information.   PLANNING Prepare estate planning, living will, healthcare POA documents. Sometimes this is best planned with the help of an attorney. Theconversationproject.org and agingwithdignity.org are excellent resources.

## 2016-11-08 ENCOUNTER — Other Ambulatory Visit (HOSPITAL_BASED_OUTPATIENT_CLINIC_OR_DEPARTMENT_OTHER): Payer: Medicare Other

## 2016-11-08 ENCOUNTER — Ambulatory Visit (HOSPITAL_BASED_OUTPATIENT_CLINIC_OR_DEPARTMENT_OTHER): Payer: Medicare Other

## 2016-11-08 VITALS — BP 198/74 | HR 71 | Temp 96.5°F | Resp 16

## 2016-11-08 DIAGNOSIS — D693 Immune thrombocytopenic purpura: Secondary | ICD-10-CM

## 2016-11-08 LAB — CBC WITH DIFFERENTIAL/PLATELET
BASO%: 0.2 % (ref 0.0–2.0)
Basophils Absolute: 0 10*3/uL (ref 0.0–0.1)
EOS%: 5.5 % (ref 0.0–7.0)
Eosinophils Absolute: 0.3 10*3/uL (ref 0.0–0.5)
HCT: 39.9 % (ref 34.8–46.6)
HEMOGLOBIN: 13.8 g/dL (ref 11.6–15.9)
LYMPH%: 20.5 % (ref 14.0–49.7)
MCH: 33.9 pg (ref 25.1–34.0)
MCHC: 34.6 g/dL (ref 31.5–36.0)
MCV: 98 fL (ref 79.5–101.0)
MONO#: 0.4 10*3/uL (ref 0.1–0.9)
MONO%: 7 % (ref 0.0–14.0)
NEUT%: 66.8 % (ref 38.4–76.8)
NEUTROS ABS: 4 10*3/uL (ref 1.5–6.5)
Platelets: 39 10*3/uL — ABNORMAL LOW (ref 145–400)
RBC: 4.07 10*6/uL (ref 3.70–5.45)
RDW: 13.8 % (ref 11.2–14.5)
WBC: 6 10*3/uL (ref 3.9–10.3)
lymph#: 1.2 10*3/uL (ref 0.9–3.3)

## 2016-11-08 MED ORDER — ROMIPLOSTIM INJECTION 500 MCG
8.0000 ug/kg | Freq: Once | SUBCUTANEOUS | Status: AC
  Administered 2016-11-08: 390 ug via SUBCUTANEOUS
  Filled 2016-11-08: qty 0.78

## 2016-11-08 NOTE — Patient Instructions (Signed)
Romiplostim injection What is this medicine? ROMIPLOSTIM (roe mi PLOE stim) helps your body make more platelets. This medicine is used to treat low platelets caused by chronic idiopathic thrombocytopenic purpura (ITP). This medicine may be used for other purposes; ask your health care provider or pharmacist if you have questions. COMMON BRAND NAME(S): Nplate What should I tell my health care provider before I take this medicine? They need to know if you have any of these conditions: -cancer or myelodysplastic syndrome -low blood counts, like low white cell, platelet, or red cell counts -take medicines that treat or prevent blood clots -an unusual or allergic reaction to romiplostim, mannitol, other medicines, foods, dyes, or preservatives -pregnant or trying to get pregnant -breast-feeding How should I use this medicine? This medicine is for injection under the skin. It is given by a health care professional in a hospital or clinic setting. A special MedGuide will be given to you before your injection. Read this information carefully each time. Talk to your pediatrician regarding the use of this medicine in children. Special care may be needed. Overdosage: If you think you have taken too much of this medicine contact a poison control center or emergency room at once. NOTE: This medicine is only for you. Do not share this medicine with others. What if I miss a dose? It is important not to miss your dose. Call your doctor or health care professional if you are unable to keep an appointment. What may interact with this medicine? Interactions are not expected. This list may not describe all possible interactions. Give your health care provider a list of all the medicines, herbs, non-prescription drugs, or dietary supplements you use. Also tell them if you smoke, drink alcohol, or use illegal drugs. Some items may interact with your medicine. What should I watch for while using this  medicine? Your condition will be monitored carefully while you are receiving this medicine. Visit your prescriber or health care professional for regular checks on your progress and for the needed blood tests. It is important to keep all appointments. What side effects may I notice from receiving this medicine? Side effects that you should report to your doctor or health care professional as soon as possible: -allergic reactions like skin rash, itching or hives, swelling of the face, lips, or tongue -shortness of breath, chest pain, swelling in a leg -unusual bleeding or bruising Side effects that usually do not require medical attention (report to your doctor or health care professional if they continue or are bothersome): -dizziness -headache -muscle aches -pain in arms and legs -stomach pain -trouble sleeping This list may not describe all possible side effects. Call your doctor for medical advice about side effects. You may report side effects to FDA at 1-800-FDA-1088. Where should I keep my medicine? This drug is given in a hospital or clinic and will not be stored at home. NOTE: This sheet is a summary. It may not cover all possible information. If you have questions about this medicine, talk to your doctor, pharmacist, or health care provider.  2018 Elsevier/Gold Standard (2008-03-07 15:13:04)  

## 2016-11-08 NOTE — Progress Notes (Addendum)
Patient has elevated BPs today.  Re-checked BPs several times.  Patient states that her BP increases whenever she comes to the physicians office.  Dr. Burr Medico notified and reminded patient to contact PCP (Dr. Merrilee Seashore)  concerning elevated BPs and monitor BPs at home.  Care giver informed and verified understanding. Patient received N-Plate injection today.  Discharged home with no signs or symptoms of distress.

## 2016-11-29 ENCOUNTER — Other Ambulatory Visit: Payer: Medicare Other

## 2016-11-29 ENCOUNTER — Ambulatory Visit: Payer: Medicare Other

## 2016-12-06 ENCOUNTER — Ambulatory Visit (HOSPITAL_BASED_OUTPATIENT_CLINIC_OR_DEPARTMENT_OTHER): Payer: Medicare Other

## 2016-12-06 ENCOUNTER — Other Ambulatory Visit (HOSPITAL_BASED_OUTPATIENT_CLINIC_OR_DEPARTMENT_OTHER): Payer: Medicare Other

## 2016-12-06 VITALS — BP 160/78 | HR 70 | Temp 97.0°F | Resp 18

## 2016-12-06 DIAGNOSIS — D693 Immune thrombocytopenic purpura: Secondary | ICD-10-CM

## 2016-12-06 LAB — CBC WITH DIFFERENTIAL/PLATELET
BASO%: 0.3 % (ref 0.0–2.0)
BASOS ABS: 0 10*3/uL (ref 0.0–0.1)
EOS ABS: 0.4 10*3/uL (ref 0.0–0.5)
EOS%: 5.9 % (ref 0.0–7.0)
HEMATOCRIT: 40.7 % (ref 34.8–46.6)
HEMOGLOBIN: 13.8 g/dL (ref 11.6–15.9)
LYMPH#: 1 10*3/uL (ref 0.9–3.3)
LYMPH%: 15.8 % (ref 14.0–49.7)
MCH: 34.2 pg — AB (ref 25.1–34.0)
MCHC: 33.9 g/dL (ref 31.5–36.0)
MCV: 100.7 fL (ref 79.5–101.0)
MONO#: 0.6 10*3/uL (ref 0.1–0.9)
MONO%: 9.3 % (ref 0.0–14.0)
NEUT#: 4.5 10*3/uL (ref 1.5–6.5)
NEUT%: 68.7 % (ref 38.4–76.8)
PLATELETS: 60 10*3/uL — AB (ref 145–400)
RBC: 4.04 10*6/uL (ref 3.70–5.45)
RDW: 13.2 % (ref 11.2–14.5)
WBC: 6.6 10*3/uL (ref 3.9–10.3)

## 2016-12-06 MED ORDER — ROMIPLOSTIM INJECTION 500 MCG
390.0000 ug | Freq: Once | SUBCUTANEOUS | Status: AC
  Administered 2016-12-06: 390 ug via SUBCUTANEOUS
  Filled 2016-12-06: qty 0.78

## 2016-12-06 NOTE — Patient Instructions (Signed)
Romiplostim injection What is this medicine? ROMIPLOSTIM (roe mi PLOE stim) helps your body make more platelets. This medicine is used to treat low platelets caused by chronic idiopathic thrombocytopenic purpura (ITP). This medicine may be used for other purposes; ask your health care provider or pharmacist if you have questions. COMMON BRAND NAME(S): Nplate What should I tell my health care provider before I take this medicine? They need to know if you have any of these conditions: -cancer or myelodysplastic syndrome -low blood counts, like low white cell, platelet, or red cell counts -take medicines that treat or prevent blood clots -an unusual or allergic reaction to romiplostim, mannitol, other medicines, foods, dyes, or preservatives -pregnant or trying to get pregnant -breast-feeding How should I use this medicine? This medicine is for injection under the skin. It is given by a health care professional in a hospital or clinic setting. A special MedGuide will be given to you before your injection. Read this information carefully each time. Talk to your pediatrician regarding the use of this medicine in children. Special care may be needed. Overdosage: If you think you have taken too much of this medicine contact a poison control center or emergency room at once. NOTE: This medicine is only for you. Do not share this medicine with others. What if I miss a dose? It is important not to miss your dose. Call your doctor or health care professional if you are unable to keep an appointment. What may interact with this medicine? Interactions are not expected. This list may not describe all possible interactions. Give your health care provider a list of all the medicines, herbs, non-prescription drugs, or dietary supplements you use. Also tell them if you smoke, drink alcohol, or use illegal drugs. Some items may interact with your medicine. What should I watch for while using this  medicine? Your condition will be monitored carefully while you are receiving this medicine. Visit your prescriber or health care professional for regular checks on your progress and for the needed blood tests. It is important to keep all appointments. What side effects may I notice from receiving this medicine? Side effects that you should report to your doctor or health care professional as soon as possible: -allergic reactions like skin rash, itching or hives, swelling of the face, lips, or tongue -shortness of breath, chest pain, swelling in a leg -unusual bleeding or bruising Side effects that usually do not require medical attention (report to your doctor or health care professional if they continue or are bothersome): -dizziness -headache -muscle aches -pain in arms and legs -stomach pain -trouble sleeping This list may not describe all possible side effects. Call your doctor for medical advice about side effects. You may report side effects to FDA at 1-800-FDA-1088. Where should I keep my medicine? This drug is given in a hospital or clinic and will not be stored at home. NOTE: This sheet is a summary. It may not cover all possible information. If you have questions about this medicine, talk to your doctor, pharmacist, or health care provider.  2018 Elsevier/Gold Standard (2008-03-07 15:13:04)  

## 2016-12-20 ENCOUNTER — Other Ambulatory Visit: Payer: Medicare Other

## 2016-12-20 ENCOUNTER — Ambulatory Visit: Payer: Medicare Other

## 2016-12-20 ENCOUNTER — Telehealth: Payer: Self-pay | Admitting: Hematology

## 2016-12-20 ENCOUNTER — Ambulatory Visit: Payer: Medicare Other | Admitting: Hematology

## 2016-12-20 NOTE — Progress Notes (Signed)
[place]  Telephone:(336) [phone] Fax:(336) [phone]  Clinic Follow Up Note   Patient Care Team: Merrilee Seashore, MD as PCP - General (Internal Medicine) Truitt Merle, MD as Consulting Physician (Hematology) 12/26/2016  CHIEF COMPLAINTS:  Follow up ITP   CURRENT THERAPY: Nplate 24mg/kg every 3 weeks to maintain plt>50K   HISTORY OF PRESENTING ILLNESS (06/21/2016):  Lisa Drafts83y.o. female was previously seen for immune thrombocytopenia care by Dr. EMarin Olp Her thrombocytopenia is well managed with Nplate. She received Nplate every 4 to 5 weeks. Her last visit and injection in his office was on 05/15/2016.   The patient is here to establish care because our office is more convenient for transportation. She is accompanied by her caregiver. She does not recall how long she has had platelet management care. She does not recall if she took steroids in the past. She denies gum bleeding and has false teeth. She denies nose bleeding, hematuria, or blood in stool. She denies any pain, bowel issue, or breathing issue.   She lives in independent living. Her care giver notes the patient "eats like a bird". She does eat a particularly well rounded diet. Her care giver has provided her with Boost/Ensure to help maintain her weight instead of sodas. The patient enjoys these drinks. Her care giver works with her 7 days a week.   She recently fell at her niece's house at Thanksgiving and twisted her ankle. This is healing well. She also fell at her cardiologist's office on 06/18/16 and hit her head on a water fountain. She often falls at the doctor's office. She recently began using a cane to ambulate. She does not use a walker.   She does not smoke or drink.   Interval History: The patient returns for follow up. She has been doing well. Her caregiver has reported she still does not eat but drinks the ensure. She has gained 3 pounds lately. She recently broke her teeth. She  denies any bleeding.    MEDICAL HISTORY:  Past Medical History:  Diagnosis Date  . Acute MI, lateral wall ([place]   . Aortic stenosis   . Chronic kidney disease   . Chronic systolic heart failure ([place]   . Coronary artery disease    sees Dr KClaiborne Billingsevery 6 months  . Dementia   . Diabetes ([place]   . DM (diabetes mellitus) ([place]    type 2 ; diet controlled  . Heart disease   . HTN (hypertension)   . Immune thrombocytopenic purpura ([place] 01/21/2013  . Ischemic heart disease   . Kidney disease   . Mitral regurgitation   . Non Hodgkin's lymphoma ([place]    of the throat  . Subdural hematoma, post-traumatic (HR[place] 2015   sees Dr NSherwood Gamblerevery 6 months  . Thrombocytopenia ([place]    sees Dr EMarin Olpevery 2 weeks ;receives Nplate prn    SURGICAL HISTORY: Past Surgical History:  Procedure Laterality Date  . BONE MARROW BIOPSY  11/22/2002   left posterior iliac crest bone marrow biopsy and aspirate  . CATARACT EXTRACTION W/ INTRAOCULAR LENS  IMPLANT, BILATERAL Bilateral   . CORONARY ARTERY BYPASS GRAFT  11/20/2007   x5  . EYE SURGERY    . submental lymph node excisional biopsy  10/22/2002    SOCIAL HISTORY: Social History   Social History  . Marital status: Widowed    Spouse name: N/A  . Number of children: 2  . Years of education: N/A   Occupational History  .  retired   Social History Main Topics  . Smoking status: Never Smoker  . Smokeless tobacco: Never Used     Comment: never used tobacco.  . Alcohol use No  . Drug use: No  . Sexual activity: No   Other Topics Concern  . Not on file   Social History Narrative   10/25/16 lives at [facility]  [phone]   Caffeine - sodas, 2-3 daily    FAMILY HISTORY: Family History  Problem Relation Age of Onset  . Heart attack Father   . Diabetes Father     ALLERGIES:  is allergic to ace inhibitors and namzaric [memantine hcl-donepezil hcl].  MEDICATIONS:  Current Outpatient Prescriptions  Medication Sig  Dispense Refill  . atorvastatin (LIPITOR) 20 MG tablet TAKE 1 TABLET (20 MG TOTAL) BY MOUTH DAILY. 30 tablet 0  . Calcium Carbonate-Vitamin D (CALCIUM-VITAMIN D) 500-200 MG-UNIT per tablet Take 1 tablet by mouth daily.     . carvedilol (COREG) 6.25 MG tablet Take 1 tablet (6.25 mg total) by mouth 2 (two) times daily with a meal. 180 tablet 1  . Cholecalciferol (VITAMIN D) 1000 UNITS capsule Take 1,000 Units by mouth at bedtime.     . fluticasone (FLONASE) 50 MCG/ACT nasal spray Place 2 sprays into both nostrils daily as needed.     . irbesartan (AVAPRO) 300 MG tablet Take 1 tablet (300 mg total) by mouth daily. 30 tablet 3  . KLOR-CON M20 20 MEQ tablet TAKE 1 TABLET EVERY DAY (Patient taking differently: TAKE 1 TABLET EVERY OTHER DAY) 90 tablet 3  . Multiple Vitamins-Minerals (PRESERVISION AREDS PO) Take 1 tablet by mouth daily.    . romiPLOStim (NPLATE) 250 MCG injection Inject 1 mcg/kg into the skin as needed (every 2-3 weeks for low platelets). Given @ Oncology    . sertraline (ZOLOFT) 25 MG tablet TAKE 1 TABLET BY MOUTH EVERY DAY 30 tablet 0  . spironolactone (ALDACTONE) 25 MG tablet Take 25 mg by mouth daily.     No current facility-administered medications for this visit.     REVIEW OF SYSTEMS:  Constitutional: Denies fevers, chills or abnormal night sweats (+) decreased appetite, gained 3 pounds lately Eyes: Denies blurriness of vision, double vision or watery eyes Ears, nose, mouth, throat, and face: Denies mucositis or sore throat Respiratory: Denies cough, dyspnea or wheezes Cardiovascular: Denies palpitation, chest discomfort or lower extremity swelling Gastrointestinal:  Denies nausea, heartburn or change in bowel habits Skin: Denies abnormal skin rashes Lymphatics: Denies new lymphadenopathy or easy bruising Musculoskeletal:  Neurological:Denies numbness, tingling or new weaknesses Behavioral/Psych: Mood is stable, no new changes  All other systems were reviewed with the  patient and are negative.  PHYSICAL EXAMINATION:  ECOG PERFORMANCE STATUS: 3 - Symptomatic, >50% confined to bed  Vitals:   12/26/16 0848  BP: (!) 154/64  Pulse: 61  Resp: 18  Temp: 97.8 F (36.6 C)   Filed Weights   12/26/16 0848  Weight: 111 lb (50.3 kg)    GENERAL:alert, no distress and comfortable. Able to get on exam table with assistance. Uses cane to ambulate. SKIN: skin color, texture, turgor are normal, no rashes or significant lesions EYES: normal, conjunctiva are pink and non-injected, sclera clear OROPHARYNX:no exudate, no erythema and lips, buccal mucosa, and tongue normal  NECK: supple, thyroid normal size, non-tender, without nodularity LYMPH:  no palpable lymphadenopathy in the cervical, axillary or inguinal LUNGS: clear to auscultation and percussion with normal breathing effort HEART: regular rate & rhythm and no  murmurs and no lower extremity edema ABDOMEN:abdomen soft, non-tender and normal bowel sounds Musculoskeletal:no cyanosis of digits and no clubbing   PSYCH: alert & oriented x 3 with fluent speech NEURO: no focal motor/sensory deficits   LABORATORY DATA:  I have reviewed the data as listed CBC Latest Ref Rng & Units 12/26/2016 12/06/2016 11/08/2016  WBC 3.9 - 10.3 10e3/uL 6.6 6.6 6.0  Hemoglobin 11.6 - 15.9 g/dL 14.0 13.8 13.8  Hematocrit 34.8 - 46.6 % 40.4 40.7 39.9  Platelets 145 - 400 10e3/uL 142(L) 60(L) 39(L)   CMP Latest Ref Rng & Units 12/26/2016 09/27/2016 06/21/2016  Glucose 70 - 140 mg/dl 107 114 128  BUN 7.0 - 26.0 mg/dL 26.0 27.5(H) 17.6  Creatinine 0.6 - 1.1 mg/dL 1.4(H) 1.3(H) 1.5(H)  Sodium 136 - 145 mEq/L 140 138 139  Potassium 3.5 - 5.1 mEq/L 5.1 4.7 4.4  Chloride 98 - 110 mmol/L - - -  CO2 22 - 29 mEq/L _0 Calcium 8.4 - 10.4 mg/dL 9.6 9.8 9.7  Total Protein 6.4 - 8.3 g/dL 6.3(L) 6.3(L) 6.7  Total Bilirubin 0.20 - 1.20 mg/dL 1.30(H) 1.26(H) 1.71(H)  Alkaline Phos 40 - 150 U/L 79 85 110  AST 5 - 34 U/L _1 ALT 0 - 55  U/L _2 PATHOLOGY REPORT  Diagnosis 02/25/2013 Bone Marrow Biopsy, left - NORMOCELLULAR MARROW WITH TRILINEAGE HEMATOPOIESIS AND MATURATION. - NO MORPHOLOGIC EVIDENCE OF CLONAL STEM CELL DISORDER OR LYMPHOMA. - SEE COMMENT. PERIPHERAL BLOOD: - THROMBOCYTOPENIA. Diagnosis Note The bone marrow is largely unremarkable. There is no morphologic evidence of a clonal stem cell process or the patient's prior lymphoma. Compared to the patient's previous bone marrow ([id number]), there has been a mild decrease in cellularity and number of megakaryocytes. There is no morphologic etiology for the patient's progressive thrombocytopenia in the current marrow.  RADIOGRAPHIC STUDIES: I have personally reviewed the radiological images as listed and agreed with the findings in the report. No results found.  ASSESSMENT & PLAN: 83 y.o. female   1. Idiopathic Thrombocytopenia Purpura (ITP) -I previously reviewed her outside records extensively including prior bone marrow biopsy -She has no abnormal bleeding or bruising -Thrombocytopenia has been well managed with Nplate. She previously received Nplate injections every 4 to 5 weeks. Her plt sometime in 30's. She also frequently reschedule her appointment due to her transportation issue.  -I'll schedule her for lab and injection every 3 weeks to keep her plt>50K -She will contact our office if she has any abnormal bruising or bleeding - Labs reviewed. [id number] on 6/7. Much improved overall with a higher dose Nplate. We will continue with Nplate injection at 16mg/kg every 3 weeks - f/u in 3 months  2. Decreased appetite -Encouraged the patient to add Glucerna to her diet to avoid weight loss. - Patient's caregiver is worried about her low appetite. I have suggested drinking more of a dietary supplement, Boost, to help with this, I encouraged her to go to dinning room and do not ski meals   3. Hypertension, DM -She will follow with her primary care  physician -She follows with cardiologist Dr. KClaiborne Billings 4. Falls -She began using a cane to ambulate after recent falls -previously encouraged the patient to begin using a walker to avoid future falls  5. Dementia -Her primary care physician has started her on medication for dementia, but both her knees and caregiver are concerned about the side effect from the medication, and has not been giving  her.  Follow up: - Labs reviewed. We will continue with Nplate injection at 19mg/kg every 3 weeks if plt<300K  - f/u in 3 months  No orders of the defined types were placed in this encounter.   All questions were answered. The patient knows to call the clinic with any problems, questions or concerns.  I spent 15 minutes counseling the patient face to face. The total time spent in the appointment was 20 minutes and more than 50% was on counseling.  This document serves as a record of services personally performed by YTruitt Merle MD. It was created on her behalf by TBrandt Loosen a trained medical scribe. The creation of this record is based on the scribe's personal observations and the provider's statements to them. This document has been checked and approved by the attending provider.      FTruitt Merle MD 12/26/2016

## 2016-12-20 NOTE — Telephone Encounter (Signed)
Pt caregiver called to r/s appts to 6/7 at 800

## 2016-12-26 ENCOUNTER — Ambulatory Visit: Payer: Medicare Other

## 2016-12-26 ENCOUNTER — Telehealth: Payer: Self-pay | Admitting: Hematology

## 2016-12-26 ENCOUNTER — Other Ambulatory Visit (HOSPITAL_BASED_OUTPATIENT_CLINIC_OR_DEPARTMENT_OTHER): Payer: Medicare Other

## 2016-12-26 ENCOUNTER — Encounter: Payer: Self-pay | Admitting: Hematology

## 2016-12-26 ENCOUNTER — Ambulatory Visit (HOSPITAL_BASED_OUTPATIENT_CLINIC_OR_DEPARTMENT_OTHER): Payer: Medicare Other | Admitting: Hematology

## 2016-12-26 ENCOUNTER — Encounter (HOSPITAL_BASED_OUTPATIENT_CLINIC_OR_DEPARTMENT_OTHER): Payer: Medicare Other

## 2016-12-26 VITALS — BP 154/64 | HR 61 | Temp 97.8°F | Resp 18 | Ht 60.0 in | Wt 111.0 lb

## 2016-12-26 DIAGNOSIS — E46 Unspecified protein-calorie malnutrition: Secondary | ICD-10-CM | POA: Diagnosis not present

## 2016-12-26 DIAGNOSIS — D693 Immune thrombocytopenic purpura: Secondary | ICD-10-CM

## 2016-12-26 LAB — COMPREHENSIVE METABOLIC PANEL
ALT: 18 U/L (ref 0–55)
AST: 30 U/L (ref 5–34)
Albumin: 3.9 g/dL (ref 3.5–5.0)
Alkaline Phosphatase: 79 U/L (ref 40–150)
Anion Gap: 12 mEq/L — ABNORMAL HIGH (ref 3–11)
BUN: 26 mg/dL (ref 7.0–26.0)
CALCIUM: 9.6 mg/dL (ref 8.4–10.4)
CHLORIDE: 105 meq/L (ref 98–109)
CO2: 24 mEq/L (ref 22–29)
Creatinine: 1.4 mg/dL — ABNORMAL HIGH (ref 0.6–1.1)
EGFR: 35 mL/min/{1.73_m2} — ABNORMAL LOW (ref >90)
Glucose: 107 mg/dl (ref 70–140)
Potassium: 5.1 mEq/L (ref 3.5–5.1)
Sodium: 140 mEq/L (ref 136–145)
Total Bilirubin: 1.3 mg/dL — ABNORMAL HIGH (ref 0.20–1.20)
Total Protein: 6.3 g/dL — ABNORMAL LOW (ref 6.4–8.3)

## 2016-12-26 LAB — CBC WITH DIFFERENTIAL/PLATELET
BASO%: 0.9 % (ref 0.0–2.0)
BASOS ABS: 0.1 10*3/uL (ref 0.0–0.1)
EOS%: 6.1 % (ref 0.0–7.0)
Eosinophils Absolute: 0.4 10*3/uL (ref 0.0–0.5)
HEMATOCRIT: 40.4 % (ref 34.8–46.6)
HGB: 14 g/dL (ref 11.6–15.9)
LYMPH#: 0.9 10*3/uL (ref 0.9–3.3)
LYMPH%: 14 % (ref 14.0–49.7)
MCH: 34.7 pg — AB (ref 25.1–34.0)
MCHC: 34.6 g/dL (ref 31.5–36.0)
MCV: 100.4 fL (ref 79.5–101.0)
MONO#: 0.5 10*3/uL (ref 0.1–0.9)
MONO%: 7.8 % (ref 0.0–14.0)
NEUT#: 4.7 10*3/uL (ref 1.5–6.5)
NEUT%: 71.2 % (ref 38.4–76.8)
Platelets: 142 10*3/uL — ABNORMAL LOW (ref 145–400)
RBC: 4.03 10*6/uL (ref 3.70–5.45)
RDW: 13.1 % (ref 11.2–14.5)
WBC: 6.6 10*3/uL (ref 3.9–10.3)

## 2016-12-26 MED ORDER — ROMIPLOSTIM INJECTION 500 MCG
390.0000 ug | Freq: Once | SUBCUTANEOUS | Status: AC
  Administered 2016-12-26: 390 ug via SUBCUTANEOUS
  Filled 2016-12-26: qty 0.78

## 2016-12-26 NOTE — Patient Instructions (Signed)
Romiplostim injection What is this medicine? ROMIPLOSTIM (roe mi PLOE stim) helps your body make more platelets. This medicine is used to treat low platelets caused by chronic idiopathic thrombocytopenic purpura (ITP). This medicine may be used for other purposes; ask your health care provider or pharmacist if you have questions. COMMON BRAND NAME(S): Nplate What should I tell my health care provider before I take this medicine? They need to know if you have any of these conditions: -cancer or myelodysplastic syndrome -low blood counts, like low white cell, platelet, or red cell counts -take medicines that treat or prevent blood clots -an unusual or allergic reaction to romiplostim, mannitol, other medicines, foods, dyes, or preservatives -pregnant or trying to get pregnant -breast-feeding How should I use this medicine? This medicine is for injection under the skin. It is given by a health care professional in a hospital or clinic setting. A special MedGuide will be given to you before your injection. Read this information carefully each time. Talk to your pediatrician regarding the use of this medicine in children. Special care may be needed. Overdosage: If you think you have taken too much of this medicine contact a poison control center or emergency room at once. NOTE: This medicine is only for you. Do not share this medicine with others. What if I miss a dose? It is important not to miss your dose. Call your doctor or health care professional if you are unable to keep an appointment. What may interact with this medicine? Interactions are not expected. This list may not describe all possible interactions. Give your health care provider a list of all the medicines, herbs, non-prescription drugs, or dietary supplements you use. Also tell them if you smoke, drink alcohol, or use illegal drugs. Some items may interact with your medicine. What should I watch for while using this  medicine? Your condition will be monitored carefully while you are receiving this medicine. Visit your prescriber or health care professional for regular checks on your progress and for the needed blood tests. It is important to keep all appointments. What side effects may I notice from receiving this medicine? Side effects that you should report to your doctor or health care professional as soon as possible: -allergic reactions like skin rash, itching or hives, swelling of the face, lips, or tongue -shortness of breath, chest pain, swelling in a leg -unusual bleeding or bruising Side effects that usually do not require medical attention (report to your doctor or health care professional if they continue or are bothersome): -dizziness -headache -muscle aches -pain in arms and legs -stomach pain -trouble sleeping This list may not describe all possible side effects. Call your doctor for medical advice about side effects. You may report side effects to FDA at 1-800-FDA-1088. Where should I keep my medicine? This drug is given in a hospital or clinic and will not be stored at home. NOTE: This sheet is a summary. It may not cover all possible information. If you have questions about this medicine, talk to your doctor, pharmacist, or health care provider.  2018 Elsevier/Gold Standard (2008-03-07 15:13:04)  

## 2016-12-26 NOTE — Telephone Encounter (Signed)
Lab and Nplate injections, every 3 weeks x 4 scheduled per 12/26/16 los. Follow up with Dr Burr Medico scheduled in 3 months, per 12/26/16 los. Patient was given a copy of the AVS report and appointment schedule, per 12/26/16 los.

## 2017-01-16 ENCOUNTER — Other Ambulatory Visit: Payer: Medicare Other

## 2017-01-16 ENCOUNTER — Ambulatory Visit: Payer: Medicare Other

## 2017-01-17 ENCOUNTER — Ambulatory Visit (HOSPITAL_BASED_OUTPATIENT_CLINIC_OR_DEPARTMENT_OTHER): Payer: Medicare Other

## 2017-01-17 ENCOUNTER — Other Ambulatory Visit (HOSPITAL_BASED_OUTPATIENT_CLINIC_OR_DEPARTMENT_OTHER): Payer: Medicare Other

## 2017-01-17 VITALS — BP 141/88 | HR 63 | Temp 97.4°F | Resp 20

## 2017-01-17 DIAGNOSIS — D693 Immune thrombocytopenic purpura: Secondary | ICD-10-CM

## 2017-01-17 LAB — CBC WITH DIFFERENTIAL/PLATELET
BASO%: 0.7 % (ref 0.0–2.0)
Basophils Absolute: 0 10*3/uL (ref 0.0–0.1)
EOS%: 7.3 % — ABNORMAL HIGH (ref 0.0–7.0)
Eosinophils Absolute: 0.5 10*3/uL (ref 0.0–0.5)
HEMATOCRIT: 41.4 % (ref 34.8–46.6)
HEMOGLOBIN: 13.9 g/dL (ref 11.6–15.9)
LYMPH#: 0.7 10*3/uL — AB (ref 0.9–3.3)
LYMPH%: 11.7 % — ABNORMAL LOW (ref 14.0–49.7)
MCH: 33.9 pg (ref 25.1–34.0)
MCHC: 33.5 g/dL (ref 31.5–36.0)
MCV: 101.1 fL — ABNORMAL HIGH (ref 79.5–101.0)
MONO#: 0.5 10*3/uL (ref 0.1–0.9)
MONO%: 7.3 % (ref 0.0–14.0)
NEUT%: 73 % (ref 38.4–76.8)
NEUTROS ABS: 4.6 10*3/uL (ref 1.5–6.5)
PLATELETS: 104 10*3/uL — AB (ref 145–400)
RBC: 4.09 10*6/uL (ref 3.70–5.45)
RDW: 13.1 % (ref 11.2–14.5)
WBC: 6.4 10*3/uL (ref 3.9–10.3)

## 2017-01-17 LAB — COMPREHENSIVE METABOLIC PANEL
ALBUMIN: 3.8 g/dL (ref 3.5–5.0)
ALK PHOS: 90 U/L (ref 40–150)
ALT: 22 U/L (ref 0–55)
ANION GAP: 11 meq/L (ref 3–11)
AST: 28 U/L (ref 5–34)
BILIRUBIN TOTAL: 1.3 mg/dL — AB (ref 0.20–1.20)
BUN: 28.7 mg/dL — AB (ref 7.0–26.0)
CALCIUM: 9.4 mg/dL (ref 8.4–10.4)
CO2: 27 mEq/L (ref 22–29)
Chloride: 102 mEq/L (ref 98–109)
Creatinine: 1.4 mg/dL — ABNORMAL HIGH (ref 0.6–1.1)
EGFR: 34 mL/min/{1.73_m2} — ABNORMAL LOW (ref >90)
Glucose: 262 mg/dl — ABNORMAL HIGH (ref 70–140)
Potassium: 5 mEq/L (ref 3.5–5.1)
Sodium: 140 mEq/L (ref 136–145)
TOTAL PROTEIN: 6.1 g/dL — AB (ref 6.4–8.3)

## 2017-01-17 MED ORDER — ROMIPLOSTIM INJECTION 500 MCG
390.0000 ug | Freq: Once | SUBCUTANEOUS | Status: AC
  Administered 2017-01-17: 390 ug via SUBCUTANEOUS
  Filled 2017-01-17: qty 0.78

## 2017-01-17 NOTE — Patient Instructions (Signed)
Romiplostim injection What is this medicine? ROMIPLOSTIM (roe mi PLOE stim) helps your body make more platelets. This medicine is used to treat low platelets caused by chronic idiopathic thrombocytopenic purpura (ITP). This medicine may be used for other purposes; ask your health care provider or pharmacist if you have questions. COMMON BRAND NAME(S): Nplate What should I tell my health care provider before I take this medicine? They need to know if you have any of these conditions: -cancer or myelodysplastic syndrome -low blood counts, like low white cell, platelet, or red cell counts -take medicines that treat or prevent blood clots -an unusual or allergic reaction to romiplostim, mannitol, other medicines, foods, dyes, or preservatives -pregnant or trying to get pregnant -breast-feeding How should I use this medicine? This medicine is for injection under the skin. It is given by a health care professional in a hospital or clinic setting. A special MedGuide will be given to you before your injection. Read this information carefully each time. Talk to your pediatrician regarding the use of this medicine in children. Special care may be needed. Overdosage: If you think you have taken too much of this medicine contact a poison control center or emergency room at once. NOTE: This medicine is only for you. Do not share this medicine with others. What if I miss a dose? It is important not to miss your dose. Call your doctor or health care professional if you are unable to keep an appointment. What may interact with this medicine? Interactions are not expected. This list may not describe all possible interactions. Give your health care provider a list of all the medicines, herbs, non-prescription drugs, or dietary supplements you use. Also tell them if you smoke, drink alcohol, or use illegal drugs. Some items may interact with your medicine. What should I watch for while using this  medicine? Your condition will be monitored carefully while you are receiving this medicine. Visit your prescriber or health care professional for regular checks on your progress and for the needed blood tests. It is important to keep all appointments. What side effects may I notice from receiving this medicine? Side effects that you should report to your doctor or health care professional as soon as possible: -allergic reactions like skin rash, itching or hives, swelling of the face, lips, or tongue -shortness of breath, chest pain, swelling in a leg -unusual bleeding or bruising Side effects that usually do not require medical attention (report to your doctor or health care professional if they continue or are bothersome): -dizziness -headache -muscle aches -pain in arms and legs -stomach pain -trouble sleeping This list may not describe all possible side effects. Call your doctor for medical advice about side effects. You may report side effects to FDA at 1-800-FDA-1088. Where should I keep my medicine? This drug is given in a hospital or clinic and will not be stored at home. NOTE: This sheet is a summary. It may not cover all possible information. If you have questions about this medicine, talk to your doctor, pharmacist, or health care provider.  2018 Elsevier/Gold Standard (2008-03-07 15:13:04)  

## 2017-02-06 ENCOUNTER — Ambulatory Visit: Payer: Medicare Other

## 2017-02-06 ENCOUNTER — Other Ambulatory Visit: Payer: Medicare Other

## 2017-02-07 ENCOUNTER — Ambulatory Visit (HOSPITAL_BASED_OUTPATIENT_CLINIC_OR_DEPARTMENT_OTHER): Payer: Medicare Other

## 2017-02-07 ENCOUNTER — Other Ambulatory Visit (HOSPITAL_BASED_OUTPATIENT_CLINIC_OR_DEPARTMENT_OTHER): Payer: Medicare Other

## 2017-02-07 VITALS — BP 189/61 | HR 62 | Temp 98.2°F | Resp 20

## 2017-02-07 DIAGNOSIS — D693 Immune thrombocytopenic purpura: Secondary | ICD-10-CM

## 2017-02-07 LAB — CBC WITH DIFFERENTIAL/PLATELET
BASO%: 0.4 % (ref 0.0–2.0)
Basophils Absolute: 0 10*3/uL (ref 0.0–0.1)
EOS%: 7.2 % — AB (ref 0.0–7.0)
Eosinophils Absolute: 0.4 10*3/uL (ref 0.0–0.5)
HCT: 37.5 % (ref 34.8–46.6)
HGB: 13.1 g/dL (ref 11.6–15.9)
LYMPH%: 15.9 % (ref 14.0–49.7)
MCH: 34.3 pg — ABNORMAL HIGH (ref 25.1–34.0)
MCHC: 34.9 g/dL (ref 31.5–36.0)
MCV: 98.2 fL (ref 79.5–101.0)
MONO#: 0.5 10*3/uL (ref 0.1–0.9)
MONO%: 10.2 % (ref 0.0–14.0)
NEUT#: 3.5 10*3/uL (ref 1.5–6.5)
NEUT%: 66.3 % (ref 38.4–76.8)
PLATELETS: 117 10*3/uL — AB (ref 145–400)
RBC: 3.82 10*6/uL (ref 3.70–5.45)
RDW: 12.8 % (ref 11.2–14.5)
WBC: 5.3 10*3/uL (ref 3.9–10.3)
lymph#: 0.8 10*3/uL — ABNORMAL LOW (ref 0.9–3.3)

## 2017-02-07 MED ORDER — ROMIPLOSTIM INJECTION 500 MCG
390.0000 ug | Freq: Once | SUBCUTANEOUS | Status: AC
  Administered 2017-02-07: 390 ug via SUBCUTANEOUS
  Filled 2017-02-07: qty 0.78

## 2017-02-07 NOTE — Patient Instructions (Signed)
Romiplostim injection What is this medicine? ROMIPLOSTIM (roe mi PLOE stim) helps your body make more platelets. This medicine is used to treat low platelets caused by chronic idiopathic thrombocytopenic purpura (ITP). This medicine may be used for other purposes; ask your health care provider or pharmacist if you have questions. COMMON BRAND NAME(S): Nplate What should I tell my health care provider before I take this medicine? They need to know if you have any of these conditions: -cancer or myelodysplastic syndrome -low blood counts, like low white cell, platelet, or red cell counts -take medicines that treat or prevent blood clots -an unusual or allergic reaction to romiplostim, mannitol, other medicines, foods, dyes, or preservatives -pregnant or trying to get pregnant -breast-feeding How should I use this medicine? This medicine is for injection under the skin. It is given by a health care professional in a hospital or clinic setting. A special MedGuide will be given to you before your injection. Read this information carefully each time. Talk to your pediatrician regarding the use of this medicine in children. Special care may be needed. Overdosage: If you think you have taken too much of this medicine contact a poison control center or emergency room at once. NOTE: This medicine is only for you. Do not share this medicine with others. What if I miss a dose? It is important not to miss your dose. Call your doctor or health care professional if you are unable to keep an appointment. What may interact with this medicine? Interactions are not expected. This list may not describe all possible interactions. Give your health care provider a list of all the medicines, herbs, non-prescription drugs, or dietary supplements you use. Also tell them if you smoke, drink alcohol, or use illegal drugs. Some items may interact with your medicine. What should I watch for while using this  medicine? Your condition will be monitored carefully while you are receiving this medicine. Visit your prescriber or health care professional for regular checks on your progress and for the needed blood tests. It is important to keep all appointments. What side effects may I notice from receiving this medicine? Side effects that you should report to your doctor or health care professional as soon as possible: -allergic reactions like skin rash, itching or hives, swelling of the face, lips, or tongue -shortness of breath, chest pain, swelling in a leg -unusual bleeding or bruising Side effects that usually do not require medical attention (report to your doctor or health care professional if they continue or are bothersome): -dizziness -headache -muscle aches -pain in arms and legs -stomach pain -trouble sleeping This list may not describe all possible side effects. Call your doctor for medical advice about side effects. You may report side effects to FDA at 1-800-FDA-1088. Where should I keep my medicine? This drug is given in a hospital or clinic and will not be stored at home. NOTE: This sheet is a summary. It may not cover all possible information. If you have questions about this medicine, talk to your doctor, pharmacist, or health care provider.  2018 Elsevier/Gold Standard (2008-03-07 15:13:04)  

## 2017-02-27 ENCOUNTER — Ambulatory Visit: Payer: Medicare Other

## 2017-02-27 ENCOUNTER — Other Ambulatory Visit: Payer: Medicare Other

## 2017-03-04 ENCOUNTER — Other Ambulatory Visit (HOSPITAL_BASED_OUTPATIENT_CLINIC_OR_DEPARTMENT_OTHER): Payer: Medicare Other

## 2017-03-04 ENCOUNTER — Ambulatory Visit (HOSPITAL_BASED_OUTPATIENT_CLINIC_OR_DEPARTMENT_OTHER): Payer: Medicare Other

## 2017-03-04 VITALS — BP 168/88 | HR 66 | Temp 97.5°F | Resp 18

## 2017-03-04 DIAGNOSIS — D693 Immune thrombocytopenic purpura: Secondary | ICD-10-CM

## 2017-03-04 LAB — CBC WITH DIFFERENTIAL/PLATELET
BASO%: 0.6 % (ref 0.0–2.0)
Basophils Absolute: 0 10*3/uL (ref 0.0–0.1)
EOS ABS: 0.7 10*3/uL — AB (ref 0.0–0.5)
EOS%: 9.1 % — AB (ref 0.0–7.0)
HCT: 39.5 % (ref 34.8–46.6)
HEMOGLOBIN: 13.6 g/dL (ref 11.6–15.9)
LYMPH%: 13.3 % — ABNORMAL LOW (ref 14.0–49.7)
MCH: 34.4 pg — ABNORMAL HIGH (ref 25.1–34.0)
MCHC: 34.3 g/dL (ref 31.5–36.0)
MCV: 100.1 fL (ref 79.5–101.0)
MONO#: 0.6 10*3/uL (ref 0.1–0.9)
MONO%: 7.7 % (ref 0.0–14.0)
NEUT%: 69.3 % (ref 38.4–76.8)
NEUTROS ABS: 5.1 10*3/uL (ref 1.5–6.5)
Platelets: 75 10*3/uL — ABNORMAL LOW (ref 145–400)
RBC: 3.95 10*6/uL (ref 3.70–5.45)
RDW: 13 % (ref 11.2–14.5)
WBC: 7.4 10*3/uL (ref 3.9–10.3)
lymph#: 1 10*3/uL (ref 0.9–3.3)

## 2017-03-04 MED ORDER — ROMIPLOSTIM INJECTION 500 MCG
390.0000 ug | Freq: Once | SUBCUTANEOUS | Status: AC
  Administered 2017-03-04: 390 ug via SUBCUTANEOUS
  Filled 2017-03-04: qty 0.78

## 2017-03-04 NOTE — Patient Instructions (Signed)
Romiplostim injection What is this medicine? ROMIPLOSTIM (roe mi PLOE stim) helps your body make more platelets. This medicine is used to treat low platelets caused by chronic idiopathic thrombocytopenic purpura (ITP). This medicine may be used for other purposes; ask your health care provider or pharmacist if you have questions. COMMON BRAND NAME(S): Nplate What should I tell my health care provider before I take this medicine? They need to know if you have any of these conditions: -cancer or myelodysplastic syndrome -low blood counts, like low white cell, platelet, or red cell counts -take medicines that treat or prevent blood clots -an unusual or allergic reaction to romiplostim, mannitol, other medicines, foods, dyes, or preservatives -pregnant or trying to get pregnant -breast-feeding How should I use this medicine? This medicine is for injection under the skin. It is given by a health care professional in a hospital or clinic setting. A special MedGuide will be given to you before your injection. Read this information carefully each time. Talk to your pediatrician regarding the use of this medicine in children. Special care may be needed. Overdosage: If you think you have taken too much of this medicine contact a poison control center or emergency room at once. NOTE: This medicine is only for you. Do not share this medicine with others. What if I miss a dose? It is important not to miss your dose. Call your doctor or health care professional if you are unable to keep an appointment. What may interact with this medicine? Interactions are not expected. This list may not describe all possible interactions. Give your health care provider a list of all the medicines, herbs, non-prescription drugs, or dietary supplements you use. Also tell them if you smoke, drink alcohol, or use illegal drugs. Some items may interact with your medicine. What should I watch for while using this  medicine? Your condition will be monitored carefully while you are receiving this medicine. Visit your prescriber or health care professional for regular checks on your progress and for the needed blood tests. It is important to keep all appointments. What side effects may I notice from receiving this medicine? Side effects that you should report to your doctor or health care professional as soon as possible: -allergic reactions like skin rash, itching or hives, swelling of the face, lips, or tongue -shortness of breath, chest pain, swelling in a leg -unusual bleeding or bruising Side effects that usually do not require medical attention (report to your doctor or health care professional if they continue or are bothersome): -dizziness -headache -muscle aches -pain in arms and legs -stomach pain -trouble sleeping This list may not describe all possible side effects. Call your doctor for medical advice about side effects. You may report side effects to FDA at 1-800-FDA-1088. Where should I keep my medicine? This drug is given in a hospital or clinic and will not be stored at home. NOTE: This sheet is a summary. It may not cover all possible information. If you have questions about this medicine, talk to your doctor, pharmacist, or health care provider.  2018 Elsevier/Gold Standard (2008-03-07 15:13:04)  

## 2017-03-19 NOTE — Progress Notes (Signed)
[place]  Telephone:(336) [phone] Fax:(336) [phone]  Clinic Follow Up Note   Patient Care Team: Merrilee Seashore, MD as PCP - General (Internal Medicine) Truitt Merle, MD as Consulting Physician (Hematology) 03/20/2017  CHIEF COMPLAINTS:  Follow up ITP   HISTORY OF PRESENTING ILLNESS (06/21/2016):  Lisa James 83 y.o. female was previously seen for immune thrombocytopenia care by Dr. Marin Olp. Her thrombocytopenia is well managed with Nplate. She received Nplate every 4 to 5 weeks. Her last visit and injection in his office was on 05/15/2016.   The patient is here to establish care because our office is more convenient for transportation. She is accompanied by her caregiver. She does not recall how long she has had platelet management care. She does not recall if she took steroids in the past. She denies gum bleeding and has false teeth. She denies nose bleeding, hematuria, or blood in stool. She denies any pain, bowel issue, or breathing issue.   She lives in independent living. Her care giver notes the patient "eats like a bird". She does eat a particularly well rounded diet. Her care giver has provided her with Boost/Ensure to help maintain her weight instead of sodas. The patient enjoys these drinks. Her care giver works with her 7 days a week.   She recently fell at her niece's house at Thanksgiving and twisted her ankle. This is healing well. She also fell at her cardiologist's office on 06/18/16 and hit her head on a water fountain. She often falls at the doctor's office. She recently began using a cane to ambulate. She does not use a walker.   She does not smoke or drink.    CURRENT THERAPY: Nplate 75mg/kg every 3 weeks to maintain plt>50K and <400K   Interval History:  Lisa HAUGERreturns for follow up. She presents to the clinic today with a caregiver. Her caregiver thinks she is fine but her appetite it not well. She moves and walks around the  building. She has not noticed any bleeding. She is overall doing well in her condition.    MEDICAL HISTORY:  Past Medical History:  Diagnosis Date  . Acute MI, lateral wall ([place]   . Aortic stenosis   . Chronic kidney disease   . Chronic systolic heart failure ([place]   . Coronary artery disease    sees Dr KClaiborne Billingsevery 6 months  . Dementia   . Diabetes ([facility]   . DM (diabetes mellitus) ([place]    type 2 ; diet controlled  . Heart disease   . HTN (hypertension)   . Immune thrombocytopenic purpura ([place] 01/21/2013  . Ischemic heart disease   . Kidney disease   . Mitral regurgitation   . Non Hodgkin's lymphoma (HW[place]    of the throat  . Subdural hematoma, post-traumatic (HF[place] 2015   sees Dr NSherwood Gamblerevery 6 months  . Thrombocytopenia ([place]    sees Dr EMarin Olpevery 2 weeks ;receives Nplate prn    SURGICAL HISTORY: Past Surgical History:  Procedure Laterality Date  . BONE MARROW BIOPSY  11/22/2002   left posterior iliac crest bone marrow biopsy and aspirate  . CATARACT EXTRACTION W/ INTRAOCULAR LENS  IMPLANT, BILATERAL Bilateral   . CORONARY ARTERY BYPASS GRAFT  11/20/2007   x5  . EYE SURGERY    . submental lymph node excisional biopsy  10/22/2002    SOCIAL HISTORY: Social History   Social History  . Marital status: Widowed    Spouse name: N/A  .  Number of children: 2  . Years of education: N/A   Occupational History  .      retired   Social History Main Topics  . Smoking status: Never Smoker  . Smokeless tobacco: Never Used     Comment: never used tobacco.  . Alcohol use No  . Drug use: No  . Sexual activity: No   Other Topics Concern  . Not on file   Social History Narrative   10/25/16 lives at [facility]  [phone]   Caffeine - sodas, 2-3 daily    FAMILY HISTORY: Family History  Problem Relation Age of Onset  . Heart attack Father   . Diabetes Father     ALLERGIES:  is allergic to ace inhibitors and namzaric [memantine hcl-donepezil  hcl].  MEDICATIONS:  Current Outpatient Prescriptions  Medication Sig Dispense Refill  . atorvastatin (LIPITOR) 20 MG tablet TAKE 1 TABLET (20 MG TOTAL) BY MOUTH DAILY. 30 tablet 0  . Calcium Carbonate-Vitamin D (CALCIUM-VITAMIN D) 500-200 MG-UNIT per tablet Take 1 tablet by mouth daily.     . carvedilol (COREG) 6.25 MG tablet Take 1 tablet (6.25 mg total) by mouth 2 (two) times daily with a meal. 180 tablet 1  . Cholecalciferol (VITAMIN D) 1000 UNITS capsule Take 1,000 Units by mouth at bedtime.     . fluticasone (FLONASE) 50 MCG/ACT nasal spray Place 2 sprays into both nostrils daily as needed.     . irbesartan (AVAPRO) 300 MG tablet Take 1 tablet (300 mg total) by mouth daily. 30 tablet 3  . KLOR-CON M20 20 MEQ tablet TAKE 1 TABLET EVERY DAY (Patient taking differently: TAKE 1 TABLET EVERY OTHER DAY) 90 tablet 3  . Multiple Vitamins-Minerals (PRESERVISION AREDS PO) Take 1 tablet by mouth daily.    . romiPLOStim (NPLATE) 250 MCG injection Inject 1 mcg/kg into the skin as needed (every 2-3 weeks for low platelets). Given @ Oncology    . sertraline (ZOLOFT) 25 MG tablet TAKE 1 TABLET BY MOUTH EVERY DAY 30 tablet 0  . spironolactone (ALDACTONE) 25 MG tablet Take 25 mg by mouth daily.     No current facility-administered medications for this visit.     REVIEW OF SYSTEMS:  Constitutional: Denies fevers, chills or abnormal night sweats (+) decreased appetite, weight stable Eyes: Denies blurriness of vision, double vision or watery eyes Ears, nose, mouth, throat, and face: Denies mucositis or sore throat Respiratory: Denies cough, dyspnea or wheezes Cardiovascular: Denies palpitation, chest discomfort or lower extremity swelling Gastrointestinal:  Denies nausea, heartburn or change in bowel habits Skin: Denies abnormal skin rashes Lymphatics: Denies new lymphadenopathy or easy bruising Musculoskeletal:  Neurological:Denies numbness, tingling or new weaknesses Behavioral/Psych: Mood is  stable, no new changes  All other systems were reviewed with the patient and are negative.  PHYSICAL EXAMINATION:  ECOG PERFORMANCE STATUS: 2  Vitals:   03/20/17 0929 03/20/17 0954  BP: (!) 210/80 (!) 181/73  Pulse: 66 62  Resp: 18   Temp: 98.4 F (36.9 C)   SpO2: 99%    Filed Weights   03/20/17 0929  Weight: 109 lb 12.8 oz (49.8 kg)    GENERAL:alert, no distress and comfortable. Able to get on exam table with assistance. Uses cane to ambulate. SKIN: skin color, texture, turgor are normal, no rashes or significant lesions EYES: normal, conjunctiva are pink and non-injected, sclera clear OROPHARYNX:no exudate, no erythema and lips, buccal mucosa, and tongue normal  NECK: supple, thyroid normal size, non-tender, without nodularity LYMPH:  no palpable lymphadenopathy in the cervical, axillary or inguinal LUNGS: clear to auscultation and percussion with normal breathing effort HEART: regular rate & rhythm and no murmurs and no lower extremity edema ABDOMEN:abdomen soft, non-tender and normal bowel sounds Musculoskeletal:no cyanosis of digits and no clubbing   PSYCH: alert & oriented x 3 with fluent speech NEURO: no focal motor/sensory deficits   LABORATORY DATA:  I have reviewed the data as listed CBC Latest Ref Rng & Units 03/20/2017 03/04/2017 02/07/2017  WBC 3.9 - 10.3 10e3/uL 8.2 7.4 5.3  Hemoglobin 11.6 - 15.9 g/dL 14.0 13.6 13.1  Hematocrit 34.8 - 46.6 % 41.1 39.5 37.5  Platelets 145 - 400 10e3/uL 157 75(L) 117(L)   CMP Latest Ref Rng & Units 01/17/2017 12/26/2016 09/27/2016  Glucose 70 - 140 mg/dl 262(H) 107 114  BUN 7.0 - 26.0 mg/dL 28.7(H) 26.0 27.5(H)  Creatinine 0.6 - 1.1 mg/dL 1.4(H) 1.4(H) 1.3(H)  Sodium 136 - 145 mEq/L 140 140 138  Potassium 3.5 - 5.1 mEq/L 5.0 5.1 4.7  Chloride 98 - 110 mmol/L - - -  CO2 22 - 29 mEq/L _0 Calcium 8.4 - 10.4 mg/dL 9.4 9.6 9.8  Total Protein 6.4 - 8.3 g/dL 6.1(L) 6.3(L) 6.3(L)  Total Bilirubin 0.20 - 1.20 mg/dL 1.30(H)  1.30(H) 1.26(H)  Alkaline Phos 40 - 150 U/L 90 79 85  AST 5 - 34 U/L _1 ALT 0 - 55 U/L _2 PATHOLOGY REPORT  Diagnosis 02/25/2013 Bone Marrow Biopsy, left - NORMOCELLULAR MARROW WITH TRILINEAGE HEMATOPOIESIS AND MATURATION. - NO MORPHOLOGIC EVIDENCE OF CLONAL STEM CELL DISORDER OR LYMPHOMA. - SEE COMMENT. PERIPHERAL BLOOD: - THROMBOCYTOPENIA. Diagnosis Note The bone marrow is largely unremarkable. There is no morphologic evidence of a clonal stem cell process or the patient's prior lymphoma. Compared to the patient's previous bone marrow ([id number]), there has been a mild decrease in cellularity and number of megakaryocytes. There is no morphologic etiology for the patient's progressive thrombocytopenia in the current marrow.  RADIOGRAPHIC STUDIES: I have personally reviewed the radiological images as listed and agreed with the findings in the report. No results found.  ASSESSMENT & PLAN: 83 y.o. female   1. Idiopathic Thrombocytopenia Purpura (ITP) -I previously reviewed her outside records extensively including prior bone marrow biopsy -She has no abnormal bleeding or bruising -Thrombocytopenia has been well managed with Nplate. She previously received Nplate injections every 4 to 5 weeks. Her plt sometime in 30's. She also frequently reschedule her appointment due to her transportation issue.  -I'll schedule her for lab and injection every 3 weeks to keep her plt>50K -She will contact our office if she has any abnormal bruising or bleeding -Labs reviewed plt 157K on 03/20/17. Much improved and labs overall normal. Her last injection was 2 weeks ago. I will lower dose to 89m/kg and continue Nplate every 3 weeks.  - f/u in 3 months  2. Decreased appetite -Encouraged the patient to add Glucerna to her diet to avoid weight loss. - Patient's caregiver is worried about her low appetite. I have suggested drinking more of a dietary supplement, Boost, to help with this, I  encouraged her to go to dinning room and do not skip meals   3. Hypertension, DM -She will follow with her primary care physician -She follows with cardiologist Dr. KClaiborne Billings-BP was 210/80, repeated 181/73, she forgot to take her blood pressure medication, she knows to take it when she gets home. -She will continue monitoring her  blood pressure.  4. Falls -She began using a cane to ambulate after recent falls -previously encouraged the patient to begin using a walker to avoid future falls  5. Dementia -Her primary care physician has started her on medication for dementia, but both her knees and caregiver are concerned about the side effect from the medication, and has not been giving her.  Follow up: -Labs reviewed. We will continue with Nplate injection, decrease dose from 43mg/kg to 722mkg, continue every 3 weeks if plt<300K  -Lab and injection every 3 weeks -F/u in 3 months   No orders of the defined types were placed in this encounter.   All questions were answered. The patient knows to call the clinic with any problems, questions or concerns.  I spent 15 minutes counseling the patient face to face. The total time spent in the appointment was 20 minutes and more than 50% was on counseling.  This document serves as a record of services personally performed by YaTruitt MerleMD. It was created on her behalf by AmJoslyn Devona trained medical scribe. The creation of this record is based on the scribe's personal observations and the provider's statements to them. This document has been checked and approved by the attending provider.      FeTruitt MerleMD 03/20/2017

## 2017-03-20 ENCOUNTER — Encounter: Payer: Self-pay | Admitting: Hematology

## 2017-03-20 ENCOUNTER — Telehealth: Payer: Self-pay | Admitting: Hematology

## 2017-03-20 ENCOUNTER — Ambulatory Visit (HOSPITAL_BASED_OUTPATIENT_CLINIC_OR_DEPARTMENT_OTHER): Payer: Medicare Other | Admitting: Hematology

## 2017-03-20 ENCOUNTER — Other Ambulatory Visit (HOSPITAL_BASED_OUTPATIENT_CLINIC_OR_DEPARTMENT_OTHER): Payer: Medicare Other

## 2017-03-20 ENCOUNTER — Ambulatory Visit (HOSPITAL_BASED_OUTPATIENT_CLINIC_OR_DEPARTMENT_OTHER): Payer: Medicare Other

## 2017-03-20 VITALS — BP 181/73 | HR 62 | Temp 98.4°F | Resp 18 | Ht 60.0 in | Wt 109.8 lb

## 2017-03-20 DIAGNOSIS — D693 Immune thrombocytopenic purpura: Secondary | ICD-10-CM

## 2017-03-20 DIAGNOSIS — E46 Unspecified protein-calorie malnutrition: Secondary | ICD-10-CM | POA: Diagnosis not present

## 2017-03-20 LAB — CBC WITH DIFFERENTIAL/PLATELET
BASO%: 0.2 % (ref 0.0–2.0)
Basophils Absolute: 0 10*3/uL (ref 0.0–0.1)
EOS%: 7.2 % — ABNORMAL HIGH (ref 0.0–7.0)
Eosinophils Absolute: 0.6 10*3/uL — ABNORMAL HIGH (ref 0.0–0.5)
HEMATOCRIT: 41.1 % (ref 34.8–46.6)
HGB: 14 g/dL (ref 11.6–15.9)
LYMPH%: 10.4 % — AB (ref 14.0–49.7)
MCH: 34 pg (ref 25.1–34.0)
MCHC: 34.1 g/dL (ref 31.5–36.0)
MCV: 99.8 fL (ref 79.5–101.0)
MONO#: 0.8 10*3/uL (ref 0.1–0.9)
MONO%: 9.8 % (ref 0.0–14.0)
NEUT#: 5.9 10*3/uL (ref 1.5–6.5)
NEUT%: 72.4 % (ref 38.4–76.8)
Platelets: 157 10*3/uL (ref 145–400)
RBC: 4.12 10*6/uL (ref 3.70–5.45)
RDW: 13.2 % (ref 11.2–14.5)
WBC: 8.2 10*3/uL (ref 3.9–10.3)
lymph#: 0.9 10*3/uL (ref 0.9–3.3)
nRBC: 0 % (ref 0–0)

## 2017-03-20 MED ORDER — ROMIPLOSTIM INJECTION 500 MCG
390.0000 ug | Freq: Once | SUBCUTANEOUS | Status: AC
  Administered 2017-03-20: 390 ug via SUBCUTANEOUS
  Filled 2017-03-20: qty 0.78

## 2017-03-20 NOTE — Telephone Encounter (Signed)
Scheduled appt per 8/30 los - Gave patient AVS and calender per los.  

## 2017-03-20 NOTE — Patient Instructions (Signed)
Romiplostim injection What is this medicine? ROMIPLOSTIM (roe mi PLOE stim) helps your body make more platelets. This medicine is used to treat low platelets caused by chronic idiopathic thrombocytopenic purpura (ITP). This medicine may be used for other purposes; ask your health care provider or pharmacist if you have questions. COMMON BRAND NAME(S): Nplate What should I tell my health care provider before I take this medicine? They need to know if you have any of these conditions: -cancer or myelodysplastic syndrome -low blood counts, like low white cell, platelet, or red cell counts -take medicines that treat or prevent blood clots -an unusual or allergic reaction to romiplostim, mannitol, other medicines, foods, dyes, or preservatives -pregnant or trying to get pregnant -breast-feeding How should I use this medicine? This medicine is for injection under the skin. It is given by a health care professional in a hospital or clinic setting. A special MedGuide will be given to you before your injection. Read this information carefully each time. Talk to your pediatrician regarding the use of this medicine in children. Special care may be needed. Overdosage: If you think you have taken too much of this medicine contact a poison control center or emergency room at once. NOTE: This medicine is only for you. Do not share this medicine with others. What if I miss a dose? It is important not to miss your dose. Call your doctor or health care professional if you are unable to keep an appointment. What may interact with this medicine? Interactions are not expected. This list may not describe all possible interactions. Give your health care provider a list of all the medicines, herbs, non-prescription drugs, or dietary supplements you use. Also tell them if you smoke, drink alcohol, or use illegal drugs. Some items may interact with your medicine. What should I watch for while using this  medicine? Your condition will be monitored carefully while you are receiving this medicine. Visit your prescriber or health care professional for regular checks on your progress and for the needed blood tests. It is important to keep all appointments. What side effects may I notice from receiving this medicine? Side effects that you should report to your doctor or health care professional as soon as possible: -allergic reactions like skin rash, itching or hives, swelling of the face, lips, or tongue -shortness of breath, chest pain, swelling in a leg -unusual bleeding or bruising Side effects that usually do not require medical attention (report to your doctor or health care professional if they continue or are bothersome): -dizziness -headache -muscle aches -pain in arms and legs -stomach pain -trouble sleeping This list may not describe all possible side effects. Call your doctor for medical advice about side effects. You may report side effects to FDA at 1-800-FDA-1088. Where should I keep my medicine? This drug is given in a hospital or clinic and will not be stored at home. NOTE: This sheet is a summary. It may not cover all possible information. If you have questions about this medicine, talk to your doctor, pharmacist, or health care provider.  2018 Elsevier/Gold Standard (2008-03-07 15:13:04)  

## 2017-04-10 ENCOUNTER — Ambulatory Visit (HOSPITAL_BASED_OUTPATIENT_CLINIC_OR_DEPARTMENT_OTHER): Payer: Medicare Other

## 2017-04-10 ENCOUNTER — Other Ambulatory Visit (HOSPITAL_BASED_OUTPATIENT_CLINIC_OR_DEPARTMENT_OTHER): Payer: Medicare Other

## 2017-04-10 VITALS — BP 183/66 | HR 57 | Temp 98.1°F | Resp 17

## 2017-04-10 DIAGNOSIS — D693 Immune thrombocytopenic purpura: Secondary | ICD-10-CM | POA: Diagnosis not present

## 2017-04-10 LAB — CBC WITH DIFFERENTIAL/PLATELET
BASO%: 0.6 % (ref 0.0–2.0)
Basophils Absolute: 0 10*3/uL (ref 0.0–0.1)
EOS%: 8.1 % — ABNORMAL HIGH (ref 0.0–7.0)
Eosinophils Absolute: 0.6 10*3/uL — ABNORMAL HIGH (ref 0.0–0.5)
HCT: 41.9 % (ref 34.8–46.6)
HEMOGLOBIN: 14.2 g/dL (ref 11.6–15.9)
LYMPH%: 11.5 % — AB (ref 14.0–49.7)
MCH: 34.1 pg — AB (ref 25.1–34.0)
MCHC: 34 g/dL (ref 31.5–36.0)
MCV: 100.4 fL (ref 79.5–101.0)
MONO#: 0.5 10*3/uL (ref 0.1–0.9)
MONO%: 7 % (ref 0.0–14.0)
NEUT%: 72.8 % (ref 38.4–76.8)
NEUTROS ABS: 5.3 10*3/uL (ref 1.5–6.5)
PLATELETS: 86 10*3/uL — AB (ref 145–400)
RBC: 4.17 10*6/uL (ref 3.70–5.45)
RDW: 13.3 % (ref 11.2–14.5)
WBC: 7.3 10*3/uL (ref 3.9–10.3)
lymph#: 0.8 10*3/uL — ABNORMAL LOW (ref 0.9–3.3)

## 2017-04-10 MED ORDER — ROMIPLOSTIM INJECTION 500 MCG
390.0000 ug | Freq: Once | SUBCUTANEOUS | Status: AC
  Administered 2017-04-10: 390 ug via SUBCUTANEOUS
  Filled 2017-04-10: qty 0.78

## 2017-04-10 NOTE — Patient Instructions (Signed)
Romiplostim injection What is this medicine? ROMIPLOSTIM (roe mi PLOE stim) helps your body make more platelets. This medicine is used to treat low platelets caused by chronic idiopathic thrombocytopenic purpura (ITP). This medicine may be used for other purposes; ask your health care provider or pharmacist if you have questions. COMMON BRAND NAME(S): Nplate What should I tell my health care provider before I take this medicine? They need to know if you have any of these conditions: -cancer or myelodysplastic syndrome -low blood counts, like low white cell, platelet, or red cell counts -take medicines that treat or prevent blood clots -an unusual or allergic reaction to romiplostim, mannitol, other medicines, foods, dyes, or preservatives -pregnant or trying to get pregnant -breast-feeding How should I use this medicine? This medicine is for injection under the skin. It is given by a health care professional in a hospital or clinic setting. A special MedGuide will be given to you before your injection. Read this information carefully each time. Talk to your pediatrician regarding the use of this medicine in children. Special care may be needed. Overdosage: If you think you have taken too much of this medicine contact a poison control center or emergency room at once. NOTE: This medicine is only for you. Do not share this medicine with others. What if I miss a dose? It is important not to miss your dose. Call your doctor or health care professional if you are unable to keep an appointment. What may interact with this medicine? Interactions are not expected. This list may not describe all possible interactions. Give your health care provider a list of all the medicines, herbs, non-prescription drugs, or dietary supplements you use. Also tell them if you smoke, drink alcohol, or use illegal drugs. Some items may interact with your medicine. What should I watch for while using this  medicine? Your condition will be monitored carefully while you are receiving this medicine. Visit your prescriber or health care professional for regular checks on your progress and for the needed blood tests. It is important to keep all appointments. What side effects may I notice from receiving this medicine? Side effects that you should report to your doctor or health care professional as soon as possible: -allergic reactions like skin rash, itching or hives, swelling of the face, lips, or tongue -shortness of breath, chest pain, swelling in a leg -unusual bleeding or bruising Side effects that usually do not require medical attention (report to your doctor or health care professional if they continue or are bothersome): -dizziness -headache -muscle aches -pain in arms and legs -stomach pain -trouble sleeping This list may not describe all possible side effects. Call your doctor for medical advice about side effects. You may report side effects to FDA at 1-800-FDA-1088. Where should I keep my medicine? This drug is given in a hospital or clinic and will not be stored at home. NOTE: This sheet is a summary. It may not cover all possible information. If you have questions about this medicine, talk to your doctor, pharmacist, or health care provider.  2018 Elsevier/Gold Standard (2008-03-07 15:13:04)  

## 2017-04-10 NOTE — Progress Notes (Signed)
clarified orders with Dr. Burr Medico. Pt to receive NPLATE as scheduled today.

## 2017-05-01 ENCOUNTER — Other Ambulatory Visit: Payer: Medicare Other

## 2017-05-01 ENCOUNTER — Ambulatory Visit: Payer: Medicare Other

## 2017-05-12 ENCOUNTER — Other Ambulatory Visit: Payer: Medicare Other

## 2017-05-12 ENCOUNTER — Ambulatory Visit: Payer: Medicare Other

## 2017-05-16 ENCOUNTER — Other Ambulatory Visit (HOSPITAL_BASED_OUTPATIENT_CLINIC_OR_DEPARTMENT_OTHER): Payer: Medicare Other

## 2017-05-16 ENCOUNTER — Ambulatory Visit (HOSPITAL_BASED_OUTPATIENT_CLINIC_OR_DEPARTMENT_OTHER): Payer: Medicare Other

## 2017-05-16 VITALS — BP 127/89 | HR 60 | Temp 98.0°F | Resp 16

## 2017-05-16 DIAGNOSIS — D693 Immune thrombocytopenic purpura: Secondary | ICD-10-CM

## 2017-05-16 LAB — CBC WITH DIFFERENTIAL/PLATELET
BASO%: 0.5 % (ref 0.0–2.0)
BASOS ABS: 0 10*3/uL (ref 0.0–0.1)
EOS ABS: 0.4 10*3/uL (ref 0.0–0.5)
EOS%: 9.4 % — AB (ref 0.0–7.0)
HCT: 41.2 % (ref 34.8–46.6)
HGB: 14 g/dL (ref 11.6–15.9)
LYMPH#: 0.8 10*3/uL — AB (ref 0.9–3.3)
LYMPH%: 17.6 % (ref 14.0–49.7)
MCH: 34.5 pg — AB (ref 25.1–34.0)
MCHC: 34 g/dL (ref 31.5–36.0)
MCV: 101.7 fL — ABNORMAL HIGH (ref 79.5–101.0)
MONO#: 0.4 10*3/uL (ref 0.1–0.9)
MONO%: 8.5 % (ref 0.0–14.0)
NEUT#: 2.9 10*3/uL (ref 1.5–6.5)
NEUT%: 64 % (ref 38.4–76.8)
Platelets: 36 10*3/uL — ABNORMAL LOW (ref 145–400)
RBC: 4.06 10*6/uL (ref 3.70–5.45)
RDW: 13.1 % (ref 11.2–14.5)
WBC: 4.5 10*3/uL (ref 3.9–10.3)

## 2017-05-16 LAB — COMPREHENSIVE METABOLIC PANEL
ALBUMIN: 4 g/dL (ref 3.5–5.0)
ALK PHOS: 80 U/L (ref 40–150)
ALT: 12 U/L (ref 0–55)
AST: 25 U/L (ref 5–34)
Anion Gap: 8 mEq/L (ref 3–11)
BILIRUBIN TOTAL: 1.76 mg/dL — AB (ref 0.20–1.20)
BUN: 18.1 mg/dL (ref 7.0–26.0)
CALCIUM: 9.7 mg/dL (ref 8.4–10.4)
CO2: 28 mEq/L (ref 22–29)
CREATININE: 1.5 mg/dL — AB (ref 0.6–1.1)
Chloride: 101 mEq/L (ref 98–109)
EGFR: 32 mL/min/{1.73_m2} — ABNORMAL LOW (ref >60)
Glucose: 153 mg/dl — ABNORMAL HIGH (ref 70–140)
POTASSIUM: 4.6 meq/L (ref 3.5–5.1)
Sodium: 138 mEq/L (ref 136–145)
Total Protein: 6.2 g/dL — ABNORMAL LOW (ref 6.4–8.3)

## 2017-05-16 MED ORDER — ROMIPLOSTIM INJECTION 500 MCG
450.0000 ug | Freq: Once | SUBCUTANEOUS | Status: AC
  Administered 2017-05-16: 450 ug via SUBCUTANEOUS
  Filled 2017-05-16: qty 0.9

## 2017-05-22 ENCOUNTER — Ambulatory Visit: Payer: Medicare Other

## 2017-05-22 ENCOUNTER — Other Ambulatory Visit: Payer: Medicare Other

## 2017-06-06 ENCOUNTER — Other Ambulatory Visit (HOSPITAL_BASED_OUTPATIENT_CLINIC_OR_DEPARTMENT_OTHER): Payer: Medicare Other

## 2017-06-06 ENCOUNTER — Telehealth: Payer: Self-pay | Admitting: Hematology

## 2017-06-06 ENCOUNTER — Ambulatory Visit (HOSPITAL_BASED_OUTPATIENT_CLINIC_OR_DEPARTMENT_OTHER): Payer: Medicare Other

## 2017-06-06 VITALS — BP 143/90 | HR 63 | Temp 97.9°F | Resp 18

## 2017-06-06 DIAGNOSIS — D693 Immune thrombocytopenic purpura: Secondary | ICD-10-CM | POA: Diagnosis not present

## 2017-06-06 LAB — COMPREHENSIVE METABOLIC PANEL
ALT: 20 U/L (ref 0–55)
ANION GAP: 10 meq/L (ref 3–11)
AST: 28 U/L (ref 5–34)
Albumin: 4.1 g/dL (ref 3.5–5.0)
Alkaline Phosphatase: 96 U/L (ref 40–150)
BILIRUBIN TOTAL: 1.68 mg/dL — AB (ref 0.20–1.20)
BUN: 20.1 mg/dL (ref 7.0–26.0)
CALCIUM: 9.8 mg/dL (ref 8.4–10.4)
CHLORIDE: 100 meq/L (ref 98–109)
CO2: 28 meq/L (ref 22–29)
CREATININE: 1.3 mg/dL — AB (ref 0.6–1.1)
EGFR: 36 mL/min/{1.73_m2} — ABNORMAL LOW (ref >60)
Glucose: 118 mg/dl (ref 70–140)
Potassium: 4.6 mEq/L (ref 3.5–5.1)
Sodium: 138 mEq/L (ref 136–145)
TOTAL PROTEIN: 6.8 g/dL (ref 6.4–8.3)

## 2017-06-06 LAB — CBC WITH DIFFERENTIAL/PLATELET
BASO%: 1.9 % (ref 0.0–2.0)
BASOS ABS: 0.1 10*3/uL (ref 0.0–0.1)
EOS ABS: 0.3 10*3/uL (ref 0.0–0.5)
EOS%: 5 % (ref 0.0–7.0)
HCT: 44.5 % (ref 34.8–46.6)
HGB: 15.2 g/dL (ref 11.6–15.9)
LYMPH%: 11.2 % — AB (ref 14.0–49.7)
MCH: 34.3 pg — AB (ref 25.1–34.0)
MCHC: 34.1 g/dL (ref 31.5–36.0)
MCV: 100.8 fL (ref 79.5–101.0)
MONO#: 0.5 10*3/uL (ref 0.1–0.9)
MONO%: 8.2 % (ref 0.0–14.0)
NEUT#: 4.5 10*3/uL (ref 1.5–6.5)
NEUT%: 73.7 % (ref 38.4–76.8)
Platelets: 146 10*3/uL (ref 145–400)
RBC: 4.42 10*6/uL (ref 3.70–5.45)
RDW: 13.5 % (ref 11.2–14.5)
WBC: 6.1 10*3/uL (ref 3.9–10.3)
lymph#: 0.7 10*3/uL — ABNORMAL LOW (ref 0.9–3.3)

## 2017-06-06 MED ORDER — ROMIPLOSTIM INJECTION 500 MCG
450.0000 ug | Freq: Once | SUBCUTANEOUS | Status: AC
  Administered 2017-06-06: 450 ug via SUBCUTANEOUS
  Filled 2017-06-06: qty 0.9

## 2017-06-06 NOTE — Patient Instructions (Signed)
Romiplostim injection What is this medicine? ROMIPLOSTIM (roe mi PLOE stim) helps your body make more platelets. This medicine is used to treat low platelets caused by chronic idiopathic thrombocytopenic purpura (ITP). This medicine may be used for other purposes; ask your health care provider or pharmacist if you have questions. COMMON BRAND NAME(S): Nplate What should I tell my health care provider before I take this medicine? They need to know if you have any of these conditions: -cancer or myelodysplastic syndrome -low blood counts, like low white cell, platelet, or red cell counts -take medicines that treat or prevent blood clots -an unusual or allergic reaction to romiplostim, mannitol, other medicines, foods, dyes, or preservatives -pregnant or trying to get pregnant -breast-feeding How should I use this medicine? This medicine is for injection under the skin. It is given by a health care professional in a hospital or clinic setting. A special MedGuide will be given to you before your injection. Read this information carefully each time. Talk to your pediatrician regarding the use of this medicine in children. Special care may be needed. Overdosage: If you think you have taken too much of this medicine contact a poison control center or emergency room at once. NOTE: This medicine is only for you. Do not share this medicine with others. What if I miss a dose? It is important not to miss your dose. Call your doctor or health care professional if you are unable to keep an appointment. What may interact with this medicine? Interactions are not expected. This list may not describe all possible interactions. Give your health care provider a list of all the medicines, herbs, non-prescription drugs, or dietary supplements you use. Also tell them if you smoke, drink alcohol, or use illegal drugs. Some items may interact with your medicine. What should I watch for while using this  medicine? Your condition will be monitored carefully while you are receiving this medicine. Visit your prescriber or health care professional for regular checks on your progress and for the needed blood tests. It is important to keep all appointments. What side effects may I notice from receiving this medicine? Side effects that you should report to your doctor or health care professional as soon as possible: -allergic reactions like skin rash, itching or hives, swelling of the face, lips, or tongue -shortness of breath, chest pain, swelling in a leg -unusual bleeding or bruising Side effects that usually do not require medical attention (report to your doctor or health care professional if they continue or are bothersome): -dizziness -headache -muscle aches -pain in arms and legs -stomach pain -trouble sleeping This list may not describe all possible side effects. Call your doctor for medical advice about side effects. You may report side effects to FDA at 1-800-FDA-1088. Where should I keep my medicine? This drug is given in a hospital or clinic and will not be stored at home. NOTE: This sheet is a summary. It may not cover all possible information. If you have questions about this medicine, talk to your doctor, pharmacist, or health care provider.  2018 Elsevier/Gold Standard (2008-03-07 15:13:04)  

## 2017-06-06 NOTE — Telephone Encounter (Signed)
Gave avs and calendar for December  °

## 2017-06-11 ENCOUNTER — Ambulatory Visit: Payer: Medicare Other | Admitting: Hematology

## 2017-06-11 ENCOUNTER — Other Ambulatory Visit: Payer: Medicare Other

## 2017-06-11 ENCOUNTER — Ambulatory Visit: Payer: Medicare Other

## 2017-06-27 ENCOUNTER — Ambulatory Visit: Payer: Medicare Other | Admitting: Hematology

## 2017-06-27 ENCOUNTER — Ambulatory Visit: Payer: Medicare Other

## 2017-06-27 ENCOUNTER — Other Ambulatory Visit: Payer: Medicare Other

## 2017-07-01 ENCOUNTER — Telehealth: Payer: Self-pay

## 2017-07-01 NOTE — Telephone Encounter (Signed)
Spoke with niece about no show appt and she will speak to the caregiver and have her call to r/s appt  Lisa James

## 2017-07-14 ENCOUNTER — Telehealth: Payer: Self-pay | Admitting: Hematology

## 2017-07-14 ENCOUNTER — Encounter: Payer: Self-pay | Admitting: Nurse Practitioner

## 2017-07-14 ENCOUNTER — Ambulatory Visit (HOSPITAL_BASED_OUTPATIENT_CLINIC_OR_DEPARTMENT_OTHER): Payer: Medicare Other

## 2017-07-14 ENCOUNTER — Ambulatory Visit (HOSPITAL_BASED_OUTPATIENT_CLINIC_OR_DEPARTMENT_OTHER): Payer: Medicare Other | Admitting: Nurse Practitioner

## 2017-07-14 ENCOUNTER — Other Ambulatory Visit (HOSPITAL_BASED_OUTPATIENT_CLINIC_OR_DEPARTMENT_OTHER): Payer: Medicare Other

## 2017-07-14 VITALS — BP 213/75 | HR 65 | Resp 18 | Ht 60.0 in | Wt 108.1 lb

## 2017-07-14 DIAGNOSIS — D693 Immune thrombocytopenic purpura: Secondary | ICD-10-CM

## 2017-07-14 DIAGNOSIS — E46 Unspecified protein-calorie malnutrition: Secondary | ICD-10-CM | POA: Diagnosis not present

## 2017-07-14 LAB — CBC WITH DIFFERENTIAL/PLATELET
BASO%: 0.3 % (ref 0.0–2.0)
Basophils Absolute: 0 10*3/uL (ref 0.0–0.1)
EOS%: 8.5 % — AB (ref 0.0–7.0)
Eosinophils Absolute: 0.6 10*3/uL — ABNORMAL HIGH (ref 0.0–0.5)
HEMATOCRIT: 39.8 % (ref 34.8–46.6)
HEMOGLOBIN: 13.8 g/dL (ref 11.6–15.9)
LYMPH#: 1.2 10*3/uL (ref 0.9–3.3)
LYMPH%: 17.9 % (ref 14.0–49.7)
MCH: 34.7 pg — AB (ref 25.1–34.0)
MCHC: 34.7 g/dL (ref 31.5–36.0)
MCV: 100 fL (ref 79.5–101.0)
MONO#: 0.5 10*3/uL (ref 0.1–0.9)
MONO%: 7.7 % (ref 0.0–14.0)
NEUT%: 65.6 % (ref 38.4–76.8)
NEUTROS ABS: 4.3 10*3/uL (ref 1.5–6.5)
Platelets: 43 10*3/uL — ABNORMAL LOW (ref 145–400)
RBC: 3.98 10*6/uL (ref 3.70–5.45)
RDW: 13.4 % (ref 11.2–14.5)
WBC: 6.6 10*3/uL (ref 3.9–10.3)
nRBC: 0 % (ref 0–0)

## 2017-07-14 LAB — COMPREHENSIVE METABOLIC PANEL
ALBUMIN: 3.9 g/dL (ref 3.5–5.0)
ALK PHOS: 82 U/L (ref 40–150)
ALT: 15 U/L (ref 0–55)
ANION GAP: 11 meq/L (ref 3–11)
AST: 27 U/L (ref 5–34)
BILIRUBIN TOTAL: 1.46 mg/dL — AB (ref 0.20–1.20)
BUN: 15.2 mg/dL (ref 7.0–26.0)
CALCIUM: 9.2 mg/dL (ref 8.4–10.4)
CO2: 24 mEq/L (ref 22–29)
Chloride: 105 mEq/L (ref 98–109)
Creatinine: 1.3 mg/dL — ABNORMAL HIGH (ref 0.6–1.1)
EGFR: 38 mL/min/{1.73_m2} — AB (ref >60)
GLUCOSE: 114 mg/dL (ref 70–140)
POTASSIUM: 4.4 meq/L (ref 3.5–5.1)
SODIUM: 140 meq/L (ref 136–145)
Total Protein: 6.2 g/dL — ABNORMAL LOW (ref 6.4–8.3)

## 2017-07-14 MED ORDER — ROMIPLOSTIM INJECTION 500 MCG
500.0000 ug | Freq: Once | SUBCUTANEOUS | Status: AC
  Administered 2017-07-14: 500 ug via SUBCUTANEOUS
  Filled 2017-07-14: qty 1

## 2017-07-14 NOTE — Telephone Encounter (Signed)
Gave avs and calendar for January - April 2019 °

## 2017-07-14 NOTE — Progress Notes (Addendum)
[place]  Telephone:([phone] Fax:(336) [phone]  Clinic Follow up Note   Patient Care Team: Merrilee Seashore, MD as PCP - General (Internal Medicine) Truitt Merle, MD as Consulting Physician (Hematology) 07/14/2017  CHIEF COMPLAINT: F/u ITP   HISTORY OF PRESENTING ILLNESS (06/21/2016):  Lisa James 83 y.o. female was previously seen for immune thrombocytopenia care by Dr. Marin Olp. Her thrombocytopenia is well managed with Nplate. She received Nplate every 4 to 5 weeks. Her last visit and injection in his office was on 05/15/2016.   The patient is here to establish care because our office is more convenient for transportation. She is accompanied by her caregiver. She does not recall how long she has had platelet management care. She does not recall if she took steroids in the past. She denies gum bleeding and has false teeth. She denies nose bleeding, hematuria, or blood in stool. She denies any pain, bowel issue, or breathing issue.   She lives in independent living. Her care giver notes the patient "eats like a bird". She does eat a particularly well rounded diet. Her care giver has provided her with Boost/Ensure to help maintain her weight instead of sodas. The patient enjoys these drinks. Her care giver works with her 7 days a week.   She recently fell at her niece's house at Thanksgiving and twisted her ankle. This is healing well. She also fell at her cardiologist's office on 06/18/16 and hit her head on a water fountain. She often falls at the doctor's office. She recently began using a cane to ambulate. She does not use a walker.   She does not smoke or drink.    CURRENT THERAPY: Nplate 53mg/kg every 3 weeks to maintain plt>50K and <400K   INTERVAL HISTORY: Ms. TPachareturns for f/u visit accompanied by her caregiver; last seen 02/2017. She has been doing well in the interim, denies changes in her overall health or new concerns. Her appetite  remains decreased but stable; she drinks 2 boost per day. Will eat lunch in her facility's dining halls but skips breakfast and dinner, will eat in her room instead. Dementia stable per her caregiver. Has "spots" biopsied on her face recently that are cancerous but cannot recall what type. Denies fever chills, cough, dyspnea, palpitations, swelling, n/v/c/d, pain, falls, or bleeding. BP is elevated today; did not take medicine yet today; denies headache, lightheadedness, dizziness, or vision change. She has no specific complaints today.    MEDICAL HISTORY:  Past Medical History:  Diagnosis Date  . Acute MI, lateral wall ([place]   . Aortic stenosis   . Chronic kidney disease   . Chronic systolic heart failure ([place]   . Coronary artery disease    sees Dr KClaiborne Billingsevery 6 months  . Dementia   . Diabetes ([place]   . DM (diabetes mellitus) ([place]    type 2 ; diet controlled  . Heart disease   . HTN (hypertension)   . Immune thrombocytopenic purpura ([facility] 01/21/2013  . Ischemic heart disease   . Kidney disease   . Mitral regurgitation   . Non Hodgkin's lymphoma ([place]    of the throat  . Subdural hematoma, post-traumatic ([place] 2015   sees Dr NSherwood Gamblerevery 6 months  . Thrombocytopenia ([facility]    sees Dr EMarin Olpevery 2 weeks ;receives Nplate prn    SURGICAL HISTORY: Past Surgical History:  Procedure Laterality Date  . BONE MARROW BIOPSY  11/22/2002   left posterior iliac crest bone marrow biopsy  and aspirate  . CATARACT EXTRACTION W/ INTRAOCULAR LENS  IMPLANT, BILATERAL Bilateral   . CORONARY ARTERY BYPASS GRAFT  11/20/2007   x5  . EYE SURGERY    . submental lymph node excisional biopsy  10/22/2002    I have reviewed the social history and family history with the patient and they are unchanged from previous note.  ALLERGIES:  is allergic to ace inhibitors and namzaric [memantine hcl-donepezil hcl].  MEDICATIONS:  Current Outpatient Medications  Medication Sig Dispense Refill  . atorvastatin  (LIPITOR) 20 MG tablet TAKE 1 TABLET (20 MG TOTAL) BY MOUTH DAILY. 30 tablet 0  . Calcium Carbonate-Vitamin D (CALCIUM-VITAMIN D) 500-200 MG-UNIT per tablet Take 1 tablet by mouth daily.     . carvedilol (COREG) 6.25 MG tablet Take 1 tablet (6.25 mg total) by mouth 2 (two) times daily with a meal. 180 tablet 1  . Cholecalciferol (VITAMIN D) 1000 UNITS capsule Take 1,000 Units by mouth at bedtime.     . fluticasone (FLONASE) 50 MCG/ACT nasal spray Place 2 sprays into both nostrils daily as needed.     . irbesartan (AVAPRO) 300 MG tablet Take 1 tablet (300 mg total) by mouth daily. 30 tablet 3  . KLOR-CON M20 20 MEQ tablet TAKE 1 TABLET EVERY DAY (Patient taking differently: TAKE 1 TABLET EVERY OTHER DAY) 90 tablet 3  . Multiple Vitamins-Minerals (PRESERVISION AREDS PO) Take 1 tablet by mouth daily.    . romiPLOStim (NPLATE) 250 MCG injection Inject 1 mcg/kg into the skin as needed (every 2-3 weeks for low platelets). Given @ Oncology    . sertraline (ZOLOFT) 25 MG tablet TAKE 1 TABLET BY MOUTH EVERY DAY 30 tablet 0  . spironolactone (ALDACTONE) 25 MG tablet Take 25 mg by mouth daily.     No current facility-administered medications for this visit.    Facility-Administered Medications Ordered in Other Visits  Medication Dose Route Frequency Provider Last Rate Last Dose  . romiPLOStim (NPLATE) injection 500 mcg  500 mcg Subcutaneous Once Truitt Merle, MD        PHYSICAL EXAMINATION: ECOG PERFORMANCE STATUS: 2 - Symptomatic, <50% confined to bed  Vitals:   07/14/17 0951  BP: (!) 213/75  Pulse: 65  Resp: 18  SpO2: 100%   Filed Weights   07/14/17 0951  Weight: 108 lb 1.6 oz (49 kg)    GENERAL:alert, no distress and comfortable; independently gets up to exam table SKIN: skin color, texture, turgor are normal, no rashes or significant lesions EYES: normal, Conjunctiva are pink and non-injected, sclera clear OROPHARYNX:no exudate, no erythema and lips, buccal mucosa, and tongue normal    NECK: supple, thyroid normal size, non-tender, without nodularity LYMPH:  no palpable lymphadenopathy in the cervical, axillary or inguinal LUNGS: clear to auscultation bilaterally with normal breathing effort HEART: regular rate & rhythm and no lower extremity edema (+) murmur ABDOMEN:abdomen soft, non-tender and normal bowel sounds Musculoskeletal:no cyanosis of digits and no clubbing  NEURO: alert & oriented x 3 with fluent speech, no focal motor/sensory deficits  LABORATORY DATA:  I have reviewed the data as listed CBC Latest Ref Rng & Units 07/14/2017 06/06/2017 05/16/2017  WBC 3.9 - 10.3 10e3/uL 6.6 6.1 4.5  Hemoglobin 11.6 - 15.9 g/dL 13.8 15.2 14.0  Hematocrit 34.8 - 46.6 % 39.8 44.5 41.2  Platelets 145 - 400 10e3/uL 43(L) 146 36(L)     CMP Latest Ref Rng & Units 07/14/2017 06/06/2017 05/16/2017  Glucose 70 - 140 mg/dl 114 118 153(H)  BUN 7.0 -  26.0 mg/dL 15.2 20.1 18.1  Creatinine 0.6 - 1.1 mg/dL 1.3(H) 1.3(H) 1.5(H)  Sodium 136 - 145 mEq/L 140 138 138  Potassium 3.5 - 5.1 mEq/L 4.4 4.6 4.6  Chloride 98 - 110 mmol/L - - -  CO2 22 - 29 mEq/L '24 28 28  ' Calcium 8.4 - 10.4 mg/dL 9.2 9.8 9.7  Total Protein 6.4 - 8.3 g/dL 6.2(L) 6.8 6.2(L)  Total Bilirubin 0.20 - 1.20 mg/dL 1.46(H) 1.68(H) 1.76(H)  Alkaline Phos 40 - 150 U/L 82 96 80  AST 5 - 34 U/L '27 28 25  ' ALT 0 - 55 U/L '15 20 12   ' PATHOLOGY REPORT  Diagnosis 02/25/2013 Bone Marrow Biopsy, left - NORMOCELLULAR MARROW WITH TRILINEAGE HEMATOPOIESIS AND MATURATION. - NO MORPHOLOGIC EVIDENCE OF CLONAL STEM CELL DISORDER OR LYMPHOMA. - SEE COMMENT. PERIPHERAL BLOOD: - THROMBOCYTOPENIA. Diagnosis Note The bone marrow is largely unremarkable. There is no morphologic evidence of a clonal stem cell process or the patient's prior lymphoma. Compared to the patient's previous bone marrow ([id number]), there has been a mild decrease in cellularity and number of megakaryocytes. There is no morphologic etiology for the patient's  progressive thrombocytopenia in the current marrow.    RADIOGRAPHIC STUDIES: I have personally reviewed the radiological images as listed and agreed with the findings in the report. No results found.   ASSESSMENT & PLAN: 83 y.o. female   1. Idiopathic Thrombocytopenia Purpura (ITP) 2. Decreased appetite 3. HTN, DM 4. Falls 5. Dementia  Ms. Blunck appears stable today. Neurological and performance status are stable. Appetite remains decreased without significant weight loss. I encouraged her and caregiver to attend more meals in dining hall and increase nutrition supplement if po intake decreases. BP markedly elevated today 213/75, improved to 165/75 on repeat per RN; asymptomatic; she has not taken meds yet today. She will take when she gets home. Labs reviewed, Cmet stable, Platelets decreased to 43k; no active bleeding. She missed last Nplate injection due to winter weather, last inj 06/06/17. Nplate will be given at increased dose today per pharmacy due to plt <50k. She will continue lab and Nplate injection q3 weeks to keep plt >50k. She will return for f/u with Dr. Burr Medico in 3-4 months.    PLAN: Increase nutritional supplement if po intake decreases; currently drinks 2/day Labs reviewed, plt 43k, no active bleeding; Nplate today at increased dose 10 mcg/kg due to plt <50k Continue lab, Nplate q3 weeks Return for f/u with Dr. Burr Medico in 3-4 months  All questions were answered. The patient knows to call the clinic with any problems, questions or concerns. No barriers to learning was detected.    Alla Feeling, NP 07/14/17

## 2017-07-14 NOTE — Patient Instructions (Signed)
Romiplostim injection What is this medicine? ROMIPLOSTIM (roe mi PLOE stim) helps your body make more platelets. This medicine is used to treat low platelets caused by chronic idiopathic thrombocytopenic purpura (ITP). This medicine may be used for other purposes; ask your health care provider or pharmacist if you have questions. COMMON BRAND NAME(S): Nplate What should I tell my health care provider before I take this medicine? They need to know if you have any of these conditions: -cancer or myelodysplastic syndrome -low blood counts, like low white cell, platelet, or red cell counts -take medicines that treat or prevent blood clots -an unusual or allergic reaction to romiplostim, mannitol, other medicines, foods, dyes, or preservatives -pregnant or trying to get pregnant -breast-feeding How should I use this medicine? This medicine is for injection under the skin. It is given by a health care professional in a hospital or clinic setting. A special MedGuide will be given to you before your injection. Read this information carefully each time. Talk to your pediatrician regarding the use of this medicine in children. Special care may be needed. Overdosage: If you think you have taken too much of this medicine contact a poison control center or emergency room at once. NOTE: This medicine is only for you. Do not share this medicine with others. What if I miss a dose? It is important not to miss your dose. Call your doctor or health care professional if you are unable to keep an appointment. What may interact with this medicine? Interactions are not expected. This list may not describe all possible interactions. Give your health care provider a list of all the medicines, herbs, non-prescription drugs, or dietary supplements you use. Also tell them if you smoke, drink alcohol, or use illegal drugs. Some items may interact with your medicine. What should I watch for while using this  medicine? Your condition will be monitored carefully while you are receiving this medicine. Visit your prescriber or health care professional for regular checks on your progress and for the needed blood tests. It is important to keep all appointments. What side effects may I notice from receiving this medicine? Side effects that you should report to your doctor or health care professional as soon as possible: -allergic reactions like skin rash, itching or hives, swelling of the face, lips, or tongue -shortness of breath, chest pain, swelling in a leg -unusual bleeding or bruising Side effects that usually do not require medical attention (report to your doctor or health care professional if they continue or are bothersome): -dizziness -headache -muscle aches -pain in arms and legs -stomach pain -trouble sleeping This list may not describe all possible side effects. Call your doctor for medical advice about side effects. You may report side effects to FDA at 1-800-FDA-1088. Where should I keep my medicine? This drug is given in a hospital or clinic and will not be stored at home. NOTE: This sheet is a summary. It may not cover all possible information. If you have questions about this medicine, talk to your doctor, pharmacist, or health care provider.  2018 Elsevier/Gold Standard (2008-03-07 15:13:04)  

## 2017-08-04 ENCOUNTER — Ambulatory Visit: Payer: Medicare Other

## 2017-08-04 ENCOUNTER — Other Ambulatory Visit: Payer: Medicare Other

## 2017-08-08 ENCOUNTER — Inpatient Hospital Stay: Payer: Medicare Other

## 2017-08-08 ENCOUNTER — Inpatient Hospital Stay: Payer: Medicare Other | Attending: Hematology

## 2017-08-08 VITALS — BP 150/88 | HR 72 | Temp 98.1°F | Resp 20

## 2017-08-08 DIAGNOSIS — D693 Immune thrombocytopenic purpura: Secondary | ICD-10-CM | POA: Diagnosis present

## 2017-08-08 LAB — CBC WITH DIFFERENTIAL/PLATELET
Basophils Absolute: 0 10*3/uL (ref 0.0–0.1)
Basophils Relative: 1 %
EOS PCT: 2 %
Eosinophils Absolute: 0.1 10*3/uL (ref 0.0–0.5)
HEMATOCRIT: 41.1 % (ref 34.8–46.6)
HEMOGLOBIN: 14.2 g/dL (ref 11.6–15.9)
LYMPHS ABS: 0.5 10*3/uL — AB (ref 0.9–3.3)
LYMPHS PCT: 11 %
MCH: 34.2 pg — AB (ref 25.1–34.0)
MCHC: 34.5 g/dL (ref 31.5–36.0)
MCV: 99 fL (ref 79.5–101.0)
Monocytes Absolute: 0.7 10*3/uL (ref 0.1–0.9)
Monocytes Relative: 16 %
Neutro Abs: 2.9 10*3/uL (ref 1.5–6.5)
Neutrophils Relative %: 70 %
PLATELETS: 86 10*3/uL — AB (ref 145–400)
RBC: 4.15 MIL/uL (ref 3.70–5.45)
RDW: 13 % (ref 11.2–16.1)
WBC: 4.2 10*3/uL (ref 3.9–10.3)

## 2017-08-08 MED ORDER — ROMIPLOSTIM INJECTION 500 MCG
500.0000 ug | Freq: Once | SUBCUTANEOUS | Status: AC
  Administered 2017-08-08: 500 ug via SUBCUTANEOUS
  Filled 2017-08-08: qty 1

## 2017-08-08 NOTE — Patient Instructions (Signed)
Romiplostim injection What is this medicine? ROMIPLOSTIM (roe mi PLOE stim) helps your body make more platelets. This medicine is used to treat low platelets caused by chronic idiopathic thrombocytopenic purpura (ITP). This medicine may be used for other purposes; ask your health care provider or pharmacist if you have questions. COMMON BRAND NAME(S): Nplate What should I tell my health care provider before I take this medicine? They need to know if you have any of these conditions: -cancer or myelodysplastic syndrome -low blood counts, like low white cell, platelet, or red cell counts -take medicines that treat or prevent blood clots -an unusual or allergic reaction to romiplostim, mannitol, other medicines, foods, dyes, or preservatives -pregnant or trying to get pregnant -breast-feeding How should I use this medicine? This medicine is for injection under the skin. It is given by a health care professional in a hospital or clinic setting. A special MedGuide will be given to you before your injection. Read this information carefully each time. Talk to your pediatrician regarding the use of this medicine in children. Special care may be needed. Overdosage: If you think you have taken too much of this medicine contact a poison control center or emergency room at once. NOTE: This medicine is only for you. Do not share this medicine with others. What if I miss a dose? It is important not to miss your dose. Call your doctor or health care professional if you are unable to keep an appointment. What may interact with this medicine? Interactions are not expected. This list may not describe all possible interactions. Give your health care provider a list of all the medicines, herbs, non-prescription drugs, or dietary supplements you use. Also tell them if you smoke, drink alcohol, or use illegal drugs. Some items may interact with your medicine. What should I watch for while using this  medicine? Your condition will be monitored carefully while you are receiving this medicine. Visit your prescriber or health care professional for regular checks on your progress and for the needed blood tests. It is important to keep all appointments. What side effects may I notice from receiving this medicine? Side effects that you should report to your doctor or health care professional as soon as possible: -allergic reactions like skin rash, itching or hives, swelling of the face, lips, or tongue -shortness of breath, chest pain, swelling in a leg -unusual bleeding or bruising Side effects that usually do not require medical attention (report to your doctor or health care professional if they continue or are bothersome): -dizziness -headache -muscle aches -pain in arms and legs -stomach pain -trouble sleeping This list may not describe all possible side effects. Call your doctor for medical advice about side effects. You may report side effects to FDA at 1-800-FDA-1088. Where should I keep my medicine? This drug is given in a hospital or clinic and will not be stored at home. NOTE: This sheet is a summary. It may not cover all possible information. If you have questions about this medicine, talk to your doctor, pharmacist, or health care provider.  2018 Elsevier/Gold Standard (2008-03-07 15:13:04)  

## 2017-08-25 ENCOUNTER — Other Ambulatory Visit: Payer: Medicare Other

## 2017-08-25 ENCOUNTER — Ambulatory Visit: Payer: Medicare Other

## 2017-09-15 ENCOUNTER — Other Ambulatory Visit: Payer: Medicare Other

## 2017-09-15 ENCOUNTER — Ambulatory Visit: Payer: Medicare Other

## 2017-09-16 ENCOUNTER — Telehealth: Payer: Self-pay | Admitting: Hematology

## 2017-09-16 NOTE — Telephone Encounter (Signed)
Called patient regarding voicemail  °

## 2017-09-18 ENCOUNTER — Other Ambulatory Visit: Payer: Medicare Other

## 2017-09-18 ENCOUNTER — Ambulatory Visit: Payer: Medicare Other

## 2017-10-06 ENCOUNTER — Inpatient Hospital Stay: Payer: Medicare Other | Attending: Hematology

## 2017-10-06 ENCOUNTER — Inpatient Hospital Stay: Payer: Medicare Other

## 2017-10-06 VITALS — BP 167/73 | HR 71 | Temp 98.1°F | Resp 18

## 2017-10-06 DIAGNOSIS — D693 Immune thrombocytopenic purpura: Secondary | ICD-10-CM | POA: Diagnosis not present

## 2017-10-06 LAB — CBC WITH DIFFERENTIAL/PLATELET
Basophils Absolute: 0 10*3/uL (ref 0.0–0.1)
Basophils Relative: 0 %
Eosinophils Absolute: 0.2 10*3/uL (ref 0.0–0.5)
Eosinophils Relative: 4 %
HCT: 37.3 % (ref 34.8–46.6)
Hemoglobin: 12.5 g/dL (ref 11.6–15.9)
LYMPHS ABS: 1.2 10*3/uL (ref 0.9–3.3)
Lymphocytes Relative: 29 %
MCH: 34.2 pg — ABNORMAL HIGH (ref 25.1–34.0)
MCHC: 33.6 g/dL (ref 31.5–36.0)
MCV: 101.8 fL — ABNORMAL HIGH (ref 79.5–101.0)
MONOS PCT: 9 %
Monocytes Absolute: 0.4 10*3/uL (ref 0.1–0.9)
NEUTROS ABS: 2.4 10*3/uL (ref 1.5–6.5)
NEUTROS PCT: 58 %
PLATELETS: 33 10*3/uL — AB (ref 145–400)
RBC: 3.66 MIL/uL — ABNORMAL LOW (ref 3.70–5.45)
RDW: 15.2 % — AB (ref 11.2–14.5)
WBC: 4.2 10*3/uL (ref 3.9–10.3)

## 2017-10-06 MED ORDER — ROMIPLOSTIM INJECTION 500 MCG
500.0000 ug | Freq: Once | SUBCUTANEOUS | Status: AC
  Administered 2017-10-06: 500 ug via SUBCUTANEOUS
  Filled 2017-10-06: qty 1

## 2017-10-06 NOTE — Patient Instructions (Signed)
Romiplostim injection What is this medicine? ROMIPLOSTIM (roe mi PLOE stim) helps your body make more platelets. This medicine is used to treat low platelets caused by chronic idiopathic thrombocytopenic purpura (ITP). This medicine may be used for other purposes; ask your health care provider or pharmacist if you have questions. COMMON BRAND NAME(S): Nplate What should I tell my health care provider before I take this medicine? They need to know if you have any of these conditions: -cancer or myelodysplastic syndrome -low blood counts, like low white cell, platelet, or red cell counts -take medicines that treat or prevent blood clots -an unusual or allergic reaction to romiplostim, mannitol, other medicines, foods, dyes, or preservatives -pregnant or trying to get pregnant -breast-feeding How should I use this medicine? This medicine is for injection under the skin. It is given by a health care professional in a hospital or clinic setting. A special MedGuide will be given to you before your injection. Read this information carefully each time. Talk to your pediatrician regarding the use of this medicine in children. Special care may be needed. Overdosage: If you think you have taken too much of this medicine contact a poison control center or emergency room at once. NOTE: This medicine is only for you. Do not share this medicine with others. What if I miss a dose? It is important not to miss your dose. Call your doctor or health care professional if you are unable to keep an appointment. What may interact with this medicine? Interactions are not expected. This list may not describe all possible interactions. Give your health care provider a list of all the medicines, herbs, non-prescription drugs, or dietary supplements you use. Also tell them if you smoke, drink alcohol, or use illegal drugs. Some items may interact with your medicine. What should I watch for while using this  medicine? Your condition will be monitored carefully while you are receiving this medicine. Visit your prescriber or health care professional for regular checks on your progress and for the needed blood tests. It is important to keep all appointments. What side effects may I notice from receiving this medicine? Side effects that you should report to your doctor or health care professional as soon as possible: -allergic reactions like skin rash, itching or hives, swelling of the face, lips, or tongue -shortness of breath, chest pain, swelling in a leg -unusual bleeding or bruising Side effects that usually do not require medical attention (report to your doctor or health care professional if they continue or are bothersome): -dizziness -headache -muscle aches -pain in arms and legs -stomach pain -trouble sleeping This list may not describe all possible side effects. Call your doctor for medical advice about side effects. You may report side effects to FDA at 1-800-FDA-1088. Where should I keep my medicine? This drug is given in a hospital or clinic and will not be stored at home. NOTE: This sheet is a summary. It may not cover all possible information. If you have questions about this medicine, talk to your doctor, pharmacist, or health care provider.  2018 Elsevier/Gold Standard (2008-03-07 15:13:04)  

## 2017-10-27 ENCOUNTER — Ambulatory Visit: Payer: Medicare Other | Admitting: Hematology

## 2017-10-27 ENCOUNTER — Ambulatory Visit: Payer: Medicare Other

## 2017-10-27 ENCOUNTER — Other Ambulatory Visit: Payer: Medicare Other

## 2017-10-29 ENCOUNTER — Telehealth: Payer: Self-pay | Admitting: Hematology

## 2017-10-29 NOTE — Telephone Encounter (Signed)
Left message for patient regarding new appt D/T per 4/8 sch msg

## 2017-11-02 ENCOUNTER — Other Ambulatory Visit: Payer: Self-pay | Admitting: Hematology

## 2017-11-02 DIAGNOSIS — D693 Immune thrombocytopenic purpura: Secondary | ICD-10-CM

## 2017-11-03 ENCOUNTER — Other Ambulatory Visit: Payer: Medicare Other

## 2017-11-03 ENCOUNTER — Ambulatory Visit: Payer: Medicare Other

## 2017-11-03 ENCOUNTER — Ambulatory Visit: Payer: Medicare Other | Admitting: Hematology

## 2017-11-05 ENCOUNTER — Ambulatory Visit: Payer: Medicare Other | Admitting: Hematology

## 2017-11-05 ENCOUNTER — Other Ambulatory Visit: Payer: Medicare Other

## 2017-11-05 ENCOUNTER — Ambulatory Visit: Payer: Medicare Other

## 2017-11-05 ENCOUNTER — Telehealth: Payer: Self-pay

## 2017-11-05 NOTE — Telephone Encounter (Signed)
Spoke care giver patient was unable to come due stomach isues per 4/17 phone que Niel Hummer patient to come tomorrow

## 2017-11-06 ENCOUNTER — Inpatient Hospital Stay: Payer: Medicare Other

## 2017-11-06 ENCOUNTER — Encounter: Payer: Self-pay | Admitting: Hematology

## 2017-11-06 ENCOUNTER — Inpatient Hospital Stay (HOSPITAL_BASED_OUTPATIENT_CLINIC_OR_DEPARTMENT_OTHER): Payer: Medicare Other | Admitting: Hematology

## 2017-11-06 ENCOUNTER — Telehealth: Payer: Self-pay | Admitting: Hematology

## 2017-11-06 ENCOUNTER — Inpatient Hospital Stay: Payer: Medicare Other | Attending: Hematology

## 2017-11-06 VITALS — BP 140/91 | HR 68 | Temp 97.9°F | Resp 18 | Ht 60.0 in | Wt 106.3 lb

## 2017-11-06 DIAGNOSIS — E119 Type 2 diabetes mellitus without complications: Secondary | ICD-10-CM | POA: Diagnosis not present

## 2017-11-06 DIAGNOSIS — R296 Repeated falls: Secondary | ICD-10-CM | POA: Insufficient documentation

## 2017-11-06 DIAGNOSIS — F0390 Unspecified dementia without behavioral disturbance: Secondary | ICD-10-CM | POA: Insufficient documentation

## 2017-11-06 DIAGNOSIS — I10 Essential (primary) hypertension: Secondary | ICD-10-CM

## 2017-11-06 DIAGNOSIS — D693 Immune thrombocytopenic purpura: Secondary | ICD-10-CM

## 2017-11-06 DIAGNOSIS — R630 Anorexia: Secondary | ICD-10-CM | POA: Diagnosis not present

## 2017-11-06 LAB — CMP (CANCER CENTER ONLY)
ALT: 17 U/L (ref 0–55)
AST: 26 U/L (ref 5–34)
Albumin: 3.9 g/dL (ref 3.5–5.0)
Alkaline Phosphatase: 95 U/L (ref 40–150)
Anion gap: 7 (ref 3–11)
BUN: 13 mg/dL (ref 7–26)
CHLORIDE: 101 mmol/L (ref 98–109)
CO2: 27 mmol/L (ref 22–29)
Calcium: 9.5 mg/dL (ref 8.4–10.4)
Creatinine: 1.25 mg/dL — ABNORMAL HIGH (ref 0.60–1.10)
GFR, Est AFR Am: 43 mL/min — ABNORMAL LOW (ref >60)
GFR, Estimated: 37 mL/min — ABNORMAL LOW (ref >60)
GLUCOSE: 104 mg/dL (ref 70–140)
POTASSIUM: 4.6 mmol/L (ref 3.5–5.1)
Sodium: 135 mmol/L — ABNORMAL LOW (ref 136–145)
Total Bilirubin: 1.4 mg/dL — ABNORMAL HIGH (ref 0.2–1.2)
Total Protein: 6.2 g/dL — ABNORMAL LOW (ref 6.4–8.3)

## 2017-11-06 LAB — CBC WITH DIFFERENTIAL/PLATELET
BASOS ABS: 0 10*3/uL (ref 0.0–0.1)
Basophils Relative: 0 %
EOS PCT: 9 %
Eosinophils Absolute: 0.6 10*3/uL — ABNORMAL HIGH (ref 0.0–0.5)
HEMATOCRIT: 40.1 % (ref 34.8–46.6)
Hemoglobin: 13.9 g/dL (ref 11.6–15.9)
Lymphocytes Relative: 24 %
Lymphs Abs: 1.7 10*3/uL (ref 0.9–3.3)
MCH: 34.7 pg — ABNORMAL HIGH (ref 25.1–34.0)
MCHC: 34.7 g/dL (ref 31.5–36.0)
MCV: 100 fL (ref 79.5–101.0)
MONO ABS: 0.6 10*3/uL (ref 0.1–0.9)
Monocytes Relative: 9 %
NEUTROS ABS: 3.9 10*3/uL (ref 1.5–6.5)
Neutrophils Relative %: 58 %
PLATELETS: 56 10*3/uL — AB (ref 145–400)
RBC: 4.01 MIL/uL (ref 3.70–5.45)
RDW: 13.4 % (ref 11.2–14.5)
WBC: 6.8 10*3/uL (ref 3.9–10.3)

## 2017-11-06 MED ORDER — ROMIPLOSTIM INJECTION 500 MCG
500.0000 ug | Freq: Once | SUBCUTANEOUS | Status: AC
  Administered 2017-11-06: 500 ug via SUBCUTANEOUS
  Filled 2017-11-06: qty 1

## 2017-11-06 NOTE — Telephone Encounter (Signed)
Scheduled appt per 4/18 los - Gave patient AVS and calender per los.  

## 2017-11-06 NOTE — Patient Instructions (Signed)
Romiplostim injection What is this medicine? ROMIPLOSTIM (roe mi PLOE stim) helps your body make more platelets. This medicine is used to treat low platelets caused by chronic idiopathic thrombocytopenic purpura (ITP). This medicine may be used for other purposes; ask your health care provider or pharmacist if you have questions. COMMON BRAND NAME(S): Nplate What should I tell my health care provider before I take this medicine? They need to know if you have any of these conditions: -cancer or myelodysplastic syndrome -low blood counts, like low white cell, platelet, or red cell counts -take medicines that treat or prevent blood clots -an unusual or allergic reaction to romiplostim, mannitol, other medicines, foods, dyes, or preservatives -pregnant or trying to get pregnant -breast-feeding How should I use this medicine? This medicine is for injection under the skin. It is given by a health care professional in a hospital or clinic setting. A special MedGuide will be given to you before your injection. Read this information carefully each time. Talk to your pediatrician regarding the use of this medicine in children. Special care may be needed. Overdosage: If you think you have taken too much of this medicine contact a poison control center or emergency room at once. NOTE: This medicine is only for you. Do not share this medicine with others. What if I miss a dose? It is important not to miss your dose. Call your doctor or health care professional if you are unable to keep an appointment. What may interact with this medicine? Interactions are not expected. This list may not describe all possible interactions. Give your health care provider a list of all the medicines, herbs, non-prescription drugs, or dietary supplements you use. Also tell them if you smoke, drink alcohol, or use illegal drugs. Some items may interact with your medicine. What should I watch for while using this  medicine? Your condition will be monitored carefully while you are receiving this medicine. Visit your prescriber or health care professional for regular checks on your progress and for the needed blood tests. It is important to keep all appointments. What side effects may I notice from receiving this medicine? Side effects that you should report to your doctor or health care professional as soon as possible: -allergic reactions like skin rash, itching or hives, swelling of the face, lips, or tongue -shortness of breath, chest pain, swelling in a leg -unusual bleeding or bruising Side effects that usually do not require medical attention (report to your doctor or health care professional if they continue or are bothersome): -dizziness -headache -muscle aches -pain in arms and legs -stomach pain -trouble sleeping This list may not describe all possible side effects. Call your doctor for medical advice about side effects. You may report side effects to FDA at 1-800-FDA-1088. Where should I keep my medicine? This drug is given in a hospital or clinic and will not be stored at home. NOTE: This sheet is a summary. It may not cover all possible information. If you have questions about this medicine, talk to your doctor, pharmacist, or health care provider.  2018 Elsevier/Gold Standard (2008-03-07 15:13:04)  

## 2017-11-06 NOTE — Progress Notes (Signed)
[place]  [phone]) [phone] Fax:(336) [phone]  Clinic Follow Up Note   Patient Care Team: Merrilee Seashore, MD as PCP - General (Internal Medicine) Truitt Merle, MD as Consulting Physician (Hematology)   Date of Service:  11/06/2017  CHIEF COMPLAINTS:  Follow up ITP   HISTORY OF PRESENTING ILLNESS (06/21/2016):  Lisa James 84 y.o. female was previously seen for immune thrombocytopenia care by Dr. Marin Olp. Her thrombocytopenia is well managed with Nplate. She received Nplate every 4 to 5 weeks. Her last visit and injection in his office was on 05/15/2016.   The patient is here to establish care because our office is more convenient for transportation. She is accompanied by her caregiver. She does not recall how long she has had platelet management care. She does not recall if she took steroids in the past. She denies gum bleeding and has false teeth. She denies nose bleeding, hematuria, or blood in stool. She denies any pain, bowel issue, or breathing issue.   She lives in independent living. Her care giver notes the patient "eats like a bird". She does eat a particularly well rounded diet. Her care giver has provided her with Boost/Ensure to help maintain her weight instead of sodas. The patient enjoys these drinks. Her care giver works with her 7 days a week.   She recently fell at her niece's house at Thanksgiving and twisted her ankle. This is healing well. She also fell at her cardiologist's office on 06/18/16 and hit her head on a water fountain. She often falls at the doctor's office. She recently began using a cane to ambulate. She does not use a walker.   She does not smoke or drink.    CURRENT THERAPY: Nplate 99mg/kg every 3 weeks to maintain plt>50K and <400K   Interval History:  Lisa BRIERLEYreturns for follow up for her ITP and Nplate injection. She was last seen by me in 02/2017 and in interim she was seen by NP Lacie in 06/2017. Her  last Nplate injection was 30/01/74 She presents to the clinic today with a caregiver. Today she notes she is doing well. She sees dermatologist for her skin lesions.    On review of symptoms, pt notes she had diarrhea yesterday likely due to what she ate. This has resolved. She has redness around her eyes. She notes some itching. This comes and goes. She has been using warm compress to help. Pt denies bleeding from anywhere or significant bruising.    MEDICAL HISTORY:  Past Medical History:  Diagnosis Date  . Acute MI, lateral wall ([place]   . Aortic stenosis   . Chronic kidney disease   . Chronic systolic heart failure (HL[place]   . Coronary artery disease    sees Dr KClaiborne Billingsevery 6 months  . Dementia   . Diabetes ([place]   . DM (diabetes mellitus) ([facility]    type 2 ; diet controlled  . Heart disease   . HTN (hypertension)   . Immune thrombocytopenic purpura ([place] 01/21/2013  . Ischemic heart disease   . Kidney disease   . Mitral regurgitation   . Non Hodgkin's lymphoma ([place]    of the throat  . Subdural hematoma, post-traumatic ([place] 2015   sees Dr NSherwood Gamblerevery 6 months  . Thrombocytopenia (HS[place]    sees Dr EMarin Olpevery 2 weeks ;receives Nplate prn    SURGICAL HISTORY: Past Surgical History:  Procedure Laterality Date  . BONE MARROW BIOPSY  11/22/2002   left  posterior iliac crest bone marrow biopsy and aspirate  . CATARACT EXTRACTION W/ INTRAOCULAR LENS  IMPLANT, BILATERAL Bilateral   . CORONARY ARTERY BYPASS GRAFT  11/20/2007   x5  . EYE SURGERY    . submental lymph node excisional biopsy  10/22/2002    SOCIAL HISTORY: Social History   Socioeconomic History  . Marital status: Widowed    Spouse name: Not on file  . Number of children: 2  . Years of education: Not on file  . Highest education level: Not on file  Occupational History    Comment: retired  Scientific laboratory technician  . Financial resource strain: Not on file  . Food insecurity:    Worry: Not on file    Inability: Not on file   . Transportation needs:    Medical: Not on file    Non-medical: Not on file  Tobacco Use  . Smoking status: Never Smoker  . Smokeless tobacco: Never Used  . Tobacco comment: never used tobacco.  Substance and Sexual Activity  . Alcohol use: No    Alcohol/week: 0.0 oz  . Drug use: No  . Sexual activity: Never    Birth control/protection: Abstinence  Lifestyle  . Physical activity:    Days per week: Not on file    Minutes per session: Not on file  . Stress: Not on file  Relationships  . Social connections:    Talks on phone: Not on file    Gets together: Not on file    Attends religious service: Not on file    Active member of club or organization: Not on file    Attends meetings of clubs or organizations: Not on file    Relationship status: Not on file  . Intimate partner violence:    Fear of current or ex partner: Not on file    Emotionally abused: Not on file    Physically abused: Not on file    Forced sexual activity: Not on file  Other Topics Concern  . Not on file  Social History Narrative   10/25/16 lives at [facility]  [phone]   Caffeine - sodas, 2-3 daily    FAMILY HISTORY: Family History  Problem Relation Age of Onset  . Heart attack Father   . Diabetes Father     ALLERGIES:  is allergic to ace inhibitors and namzaric [memantine hcl-donepezil hcl].  MEDICATIONS:  Current Outpatient Medications  Medication Sig Dispense Refill  . atorvastatin (LIPITOR) 20 MG tablet TAKE 1 TABLET (20 MG TOTAL) BY MOUTH DAILY. 30 tablet 0  . Calcium Carbonate-Vitamin D (CALCIUM-VITAMIN D) 500-200 MG-UNIT per tablet Take 1 tablet by mouth daily.     . carvedilol (COREG) 6.25 MG tablet Take 1 tablet (6.25 mg total) by mouth 2 (two) times daily with a meal. 180 tablet 1  . Cholecalciferol (VITAMIN D) 1000 UNITS capsule Take 1,000 Units by mouth at bedtime.     . fluticasone (FLONASE) 50 MCG/ACT nasal spray Place 2 sprays into both nostrils daily as needed.     .  irbesartan (AVAPRO) 300 MG tablet Take 1 tablet (300 mg total) by mouth daily. 30 tablet 3  . KLOR-CON M20 20 MEQ tablet TAKE 1 TABLET EVERY DAY (Patient taking differently: TAKE 1 TABLET EVERY OTHER DAY) 90 tablet 3  . Multiple Vitamins-Minerals (PRESERVISION AREDS PO) Take 1 tablet by mouth daily.    . romiPLOStim (NPLATE) 250 MCG injection Inject 1 mcg/kg into the skin as needed (every 2-3 weeks for low platelets).  Given @ Oncology    . sertraline (ZOLOFT) 25 MG tablet TAKE 1 TABLET BY MOUTH EVERY DAY 30 tablet 0  . spironolactone (ALDACTONE) 25 MG tablet Take 25 mg by mouth daily.     No current facility-administered medications for this visit.    Facility-Administered Medications Ordered in Other Visits  Medication Dose Route Frequency Provider Last Rate Last Dose  . romiPLOStim (NPLATE) injection 335 mcg  7 mcg/kg Subcutaneous Once Truitt Merle, MD        REVIEW OF SYSTEMS:  Constitutional: Denies fevers, chills or abnormal night sweats  Eyes: Denies blurriness of vision, double vision or watery eyes (+) occasional redness and itching of eyes Ears, nose, mouth, throat, and face: Denies mucositis or sore throat Respiratory: Denies cough, dyspnea or wheezes Cardiovascular: Denies palpitation, chest discomfort or lower extremity swelling Gastrointestinal:  Denies nausea, heartburn or change in bowel habits Skin: Denies abnormal skin rashes Lymphatics: Denies new lymphadenopathy or easy bruising Musculoskeletal:  Neurological:Denies numbness, tingling or new weaknesses Behavioral/Psych: Mood is stable, no new changes  All other systems were reviewed with the patient and are negative.  PHYSICAL EXAMINATION:  ECOG PERFORMANCE STATUS: 2  Vitals:   11/06/17 0909  BP: (!) 140/91  Pulse: 68  Resp: 18  Temp: 97.9 F (36.6 C)  SpO2: 99%   Filed Weights   11/06/17 0909  Weight: 106 lb 4.8 oz (48.2 kg)    GENERAL:alert, no distress and comfortable. Able to get on exam table with  assistance. Uses cane to ambulate. SKIN: skin color, texture, turgor are normal, no rashes or significant lesions EYES: normal, conjunctiva are pink and non-injected, sclera clear OROPHARYNX:no exudate, no erythema and lips, buccal mucosa, and tongue normal  NECK: supple, thyroid normal size, non-tender, without nodularity LYMPH:  no palpable lymphadenopathy in the cervical, axillary or inguinal LUNGS: clear to auscultation and percussion with normal breathing effort HEART: regular rate & rhythm and no murmurs and no lower extremity edema ABDOMEN:abdomen soft, non-tender and normal bowel sounds Musculoskeletal:no cyanosis of digits and no clubbing   PSYCH: alert & oriented x 3 with fluent speech NEURO: no focal motor/sensory deficits   LABORATORY DATA:  I have reviewed the data as listed CBC Latest Ref Rng & Units 11/06/2017 10/06/2017 08/08/2017  WBC 3.9 - 10.3 K/uL 6.8 4.2 4.2  Hemoglobin 11.6 - 15.9 g/dL 13.9 12.5 14.2  Hematocrit 34.8 - 46.6 % 40.1 37.3 41.1  Platelets 145 - 400 K/uL 56(L) 33(L) 86(L)   CMP Latest Ref Rng & Units 11/06/2017 07/14/2017 06/06/2017  Glucose 70 - 140 mg/dL 104 114 118  BUN 7 - 26 mg/dL 13 15.2 20.1  Creatinine 0.60 - 1.10 mg/dL 1.25(H) 1.3(H) 1.3(H)  Sodium 136 - 145 mmol/L 135(L) 140 138  Potassium 3.5 - 5.1 mmol/L 4.6 4.4 4.6  Chloride 98 - 109 mmol/L 101 - -  CO2 22 - 29 mmol/L _0 Calcium 8.4 - 10.4 mg/dL 9.5 9.2 9.8  Total Protein 6.4 - 8.3 g/dL 6.2(L) 6.2(L) 6.8  Total Bilirubin 0.2 - 1.2 mg/dL 1.4(H) 1.46(H) 1.68(H)  Alkaline Phos 40 - 150 U/L 95 82 96  AST 5 - 34 U/L _1 ALT 0 - 55 U/L _2 PATHOLOGY REPORT  Diagnosis 02/25/2013 Bone Marrow Biopsy, left - NORMOCELLULAR MARROW WITH TRILINEAGE HEMATOPOIESIS AND MATURATION. - NO MORPHOLOGIC EVIDENCE OF CLONAL STEM CELL DISORDER OR LYMPHOMA. - SEE COMMENT. PERIPHERAL BLOOD: - THROMBOCYTOPENIA. Diagnosis Note The bone marrow is largely unremarkable.  There is no morphologic  evidence of a clonal stem cell process or the patient's prior lymphoma. Compared to the patient's previous bone marrow ([id number]), there has been a mild decrease in cellularity and number of megakaryocytes. There is no morphologic etiology for the patient's progressive thrombocytopenia in the current marrow.  RADIOGRAPHIC STUDIES: I have personally reviewed the radiological images as listed and agreed with the findings in the report. No results found.  ASSESSMENT & PLAN: 84 y.o. female   1. Idiopathic Thrombocytopenia Purpura (ITP) -I previously reviewed her outside records extensively including prior bone marrow biopsy -She has no abnormal bleeding or bruising -Thrombocytopenia has been well managed with Nplate. She previously received Nplate injections every 4 to 5 weeks. She also frequently reschedule her appointment due to her transportation issue.  -I'll schedule her for lab and injection every 3 weeks to keep her plt>50K -She will contact our office if she has any abnormal bruising or bleeding -I preciously lowered her dose to 69m/kg and continue Nplate every 3 weeks.  -Today's labs reviewed, plt 56K. Her last injection was 10/06/17. Will proceed with injection today  -I suggested she change her injection to oral pill Promacta so she is less likely to miss treatment. Her care giver feels it's better for her to continue injection and she will bring her to the appointments. We will continue injections every 3 weeks for now.  - f/u in 3 months  2. Decreased appetite -Encouraged the patient to add Glucerna to her diet to avoid weight loss. -Patient's caregiver is worried about her low appetite. I have suggested drinking more of a dietary supplement, Boost, to help with this, I encouraged her to go to dinning room and do not skip meals  -Appetite remains decreased without significant weight loss   3. Hypertension, DM -She will follow with her primary care physician -She follows with  cardiologist Dr. KClaiborne Billings-She will continue monitoring her blood pressure.  4. Falls -She began using a cane to ambulate after recent falls -previously encouraged the patient to begin using a walker to avoid future falls  5. Dementia -Her primary care physician has started her on medication for dementia, but both her knees and caregiver are concerned about the side effect from the medication, and has not been giving her.  Follow up: -Labs reviewed. We will continue with Nplate injection 75mkg today   -Lab and injection every 3 weeks x4 -F/u in 12 weeks   No orders of the defined types were placed in this encounter.   All questions were answered. The patient knows to call the clinic with any problems, questions or concerns.  I spent 15 minutes counseling the patient face to face. The total time spent in the appointment was 20 minutes and more than 50% was on counseling.  This document serves as a record of services personally performed by YaTruitt MerleMD. It was created on her behalf by AmJoslyn Devona trained medical scribe. The creation of this record is based on the scribe's personal observations and the provider's statements to them.   I have reviewed the above documentation for accuracy and completeness, and I agree with the above.       YaTruitt MerleMD 11/06/2017

## 2017-11-27 ENCOUNTER — Inpatient Hospital Stay: Payer: Medicare Other

## 2017-11-27 ENCOUNTER — Inpatient Hospital Stay: Payer: Medicare Other | Attending: Hematology

## 2017-12-18 ENCOUNTER — Inpatient Hospital Stay: Payer: Medicare Other

## 2017-12-22 ENCOUNTER — Inpatient Hospital Stay: Payer: Medicare Other | Attending: Hematology

## 2017-12-22 ENCOUNTER — Inpatient Hospital Stay: Payer: Medicare Other

## 2017-12-22 DIAGNOSIS — D693 Immune thrombocytopenic purpura: Secondary | ICD-10-CM

## 2017-12-22 LAB — CBC WITH DIFFERENTIAL/PLATELET
BASOS ABS: 0 10*3/uL (ref 0.0–0.1)
Basophils Relative: 0 %
EOS PCT: 10 %
Eosinophils Absolute: 0.6 10*3/uL — ABNORMAL HIGH (ref 0.0–0.5)
HEMATOCRIT: 37.8 % (ref 34.8–46.6)
HEMOGLOBIN: 12.8 g/dL (ref 11.6–15.9)
LYMPHS ABS: 1.2 10*3/uL (ref 0.9–3.3)
LYMPHS PCT: 21 %
MCH: 34.5 pg — AB (ref 25.1–34.0)
MCHC: 34 g/dL (ref 31.5–36.0)
MCV: 101.7 fL — ABNORMAL HIGH (ref 79.5–101.0)
Monocytes Absolute: 0.4 10*3/uL (ref 0.1–0.9)
Monocytes Relative: 7 %
NEUTROS ABS: 3.6 10*3/uL (ref 1.5–6.5)
Neutrophils Relative %: 62 %
PLATELETS: 43 10*3/uL — AB (ref 145–400)
RBC: 3.71 MIL/uL (ref 3.70–5.45)
RDW: 13.8 % (ref 11.2–14.5)
WBC: 5.8 10*3/uL (ref 3.9–10.3)

## 2017-12-22 MED ORDER — ROMIPLOSTIM INJECTION 500 MCG
500.0000 ug | Freq: Once | SUBCUTANEOUS | Status: AC
  Administered 2017-12-22: 500 ug via SUBCUTANEOUS
  Filled 2017-12-22: qty 1

## 2017-12-22 NOTE — Patient Instructions (Signed)
Romiplostim injection What is this medicine? ROMIPLOSTIM (roe mi PLOE stim) helps your body make more platelets. This medicine is used to treat low platelets caused by chronic idiopathic thrombocytopenic purpura (ITP). This medicine may be used for other purposes; ask your health care provider or pharmacist if you have questions. COMMON BRAND NAME(S): Nplate What should I tell my health care provider before I take this medicine? They need to know if you have any of these conditions: -cancer or myelodysplastic syndrome -low blood counts, like low white cell, platelet, or red cell counts -take medicines that treat or prevent blood clots -an unusual or allergic reaction to romiplostim, mannitol, other medicines, foods, dyes, or preservatives -pregnant or trying to get pregnant -breast-feeding How should I use this medicine? This medicine is for injection under the skin. It is given by a health care professional in a hospital or clinic setting. A special MedGuide will be given to you before your injection. Read this information carefully each time. Talk to your pediatrician regarding the use of this medicine in children. Special care may be needed. Overdosage: If you think you have taken too much of this medicine contact a poison control center or emergency room at once. NOTE: This medicine is only for you. Do not share this medicine with others. What if I miss a dose? It is important not to miss your dose. Call your doctor or health care professional if you are unable to keep an appointment. What may interact with this medicine? Interactions are not expected. This list may not describe all possible interactions. Give your health care provider a list of all the medicines, herbs, non-prescription drugs, or dietary supplements you use. Also tell them if you smoke, drink alcohol, or use illegal drugs. Some items may interact with your medicine. What should I watch for while using this  medicine? Your condition will be monitored carefully while you are receiving this medicine. Visit your prescriber or health care professional for regular checks on your progress and for the needed blood tests. It is important to keep all appointments. What side effects may I notice from receiving this medicine? Side effects that you should report to your doctor or health care professional as soon as possible: -allergic reactions like skin rash, itching or hives, swelling of the face, lips, or tongue -shortness of breath, chest pain, swelling in a leg -unusual bleeding or bruising Side effects that usually do not require medical attention (report to your doctor or health care professional if they continue or are bothersome): -dizziness -headache -muscle aches -pain in arms and legs -stomach pain -trouble sleeping This list may not describe all possible side effects. Call your doctor for medical advice about side effects. You may report side effects to FDA at 1-800-FDA-1088. Where should I keep my medicine? This drug is given in a hospital or clinic and will not be stored at home. NOTE: This sheet is a summary. It may not cover all possible information. If you have questions about this medicine, talk to your doctor, pharmacist, or health care provider.  2018 Elsevier/Gold Standard (2008-03-07 15:13:04)  

## 2017-12-29 ENCOUNTER — Telehealth: Payer: Self-pay | Admitting: Hematology

## 2017-12-29 NOTE — Telephone Encounter (Signed)
Caregiver called in to change appointment date/time

## 2018-01-05 ENCOUNTER — Inpatient Hospital Stay: Payer: Medicare Other

## 2018-01-08 ENCOUNTER — Ambulatory Visit: Payer: Medicare Other

## 2018-01-08 ENCOUNTER — Other Ambulatory Visit: Payer: Medicare Other

## 2018-01-27 NOTE — Progress Notes (Signed)
[place]  [phone] [phone]  Clinic Follow Up Note   Patient Care Team: Merrilee Seashore, MD as PCP - General (Internal Medicine) Truitt Merle, MD as Consulting Physician (Hematology)   Date of Service:  01/29/2018  CHIEF COMPLAINTS:  Follow up ITP   HISTORY OF PRESENTING ILLNESS (06/21/2016):  Lisa James 84 y.o. female was previously seen for immune thrombocytopenia care by Dr. Marin Olp. Her thrombocytopenia is well managed with Nplate. She received Nplate every 4 to 5 weeks. Her last visit and injection in his office was on 05/15/2016.   The patient is here to establish care because our office is more convenient for transportation. She is accompanied by her caregiver. She does not recall how long she has had platelet management care. She does not recall if she took steroids in the past. She denies gum bleeding and has false teeth. She denies nose bleeding, hematuria, or blood in stool. She denies any pain, bowel issue, or breathing issue.   She lives in independent living. Her care giver notes the patient "eats like a bird". She does eat a particularly well rounded diet. Her care giver has provided her with Boost/Ensure to help maintain her weight instead of sodas. The patient enjoys these drinks. Her care giver works with her 7 days a week.   She recently fell at her niece's house at Thanksgiving and twisted her ankle. This is healing well. She also fell at her cardiologist's office on 06/18/16 and hit her head on a water fountain. She often falls at the doctor's office. She recently began using a cane to ambulate. She does not use a walker.   She does not smoke or drink.    CURRENT THERAPY: Nplate 42mg/kg every 3 weeks to maintain plt>50K and <400K and dose adjust as needed.   Interval History:   Lisa WALLERreturns for follow up for her ITP and Nplate injection. She present to the clinic today accompanied by her caregiver.  She notes she did not know she was suppose to come every 3 weeks. Her last injection was 12/22/17 so her platelet counts have dropped. Her caregiver notes she was not able to come due to no availability to bring her. Pt notes she is able to move around and be mobile as needed.  She denies any signs of bleeding or falls.    MEDICAL HISTORY:  Past Medical History:  Diagnosis Date  . Acute MI, lateral wall ([facility]   . Aortic stenosis   . Chronic kidney disease   . Chronic systolic heart failure ([facility]   . Coronary artery disease    sees Dr KClaiborne Billingsevery 6 months  . Dementia   . Diabetes ([facility]   . DM (diabetes mellitus) ([facility]    type 2 ; diet controlled  . Heart disease   . HTN (hypertension)   . Immune thrombocytopenic purpura ([facility] 01/21/2013  . Ischemic heart disease   . Kidney disease   . Mitral regurgitation   . Non Hodgkin's lymphoma ([facility]    of the throat  . Subdural hematoma, post-traumatic ([facility] 2015   sees Dr NSherwood Gamblerevery 6 months  . Thrombocytopenia ([facility]    sees Dr EMarin Olpevery 2 weeks ;receives Nplate prn    SURGICAL HISTORY: Past Surgical History:  Procedure Laterality Date  . BONE MARROW BIOPSY  11/22/2002   left posterior iliac crest bone marrow biopsy and aspirate  . CATARACT EXTRACTION W/ INTRAOCULAR LENS  IMPLANT, BILATERAL Bilateral   .  CORONARY ARTERY BYPASS GRAFT  11/20/2007   x5  . EYE SURGERY    . submental lymph node excisional biopsy  10/22/2002    SOCIAL HISTORY: Social History   Socioeconomic History  . Marital status: Widowed    Spouse name: Not on file  . Number of children: 2  . Years of education: Not on file  . Highest education level: Not on file  Occupational History    Comment: retired  Social Needs  . Financial resource strain: Not on file  . Food insecurity:    Worry: Not on file    Inability: Not on file  . Transportation needs:    Medical: Not on file    Non-medical: Not on file  Tobacco Use  . Smoking status: Never Smoker  .  Smokeless tobacco: Never Used  . Tobacco comment: never used tobacco.  Substance and Sexual Activity  . Alcohol use: No    Alcohol/week: 0.0 oz  . Drug use: No  . Sexual activity: Never    Birth control/protection: Abstinence  Lifestyle  . Physical activity:    Days per week: Not on file    Minutes per session: Not on file  . Stress: Not on file  Relationships  . Social connections:    Talks on phone: Not on file    Gets together: Not on file    Attends religious service: Not on file    Active member of club or organization: Not on file    Attends meetings of clubs or organizations: Not on file    Relationship status: Not on file  . Intimate partner violence:    Fear of current or ex partner: Not on file    Emotionally abused: Not on file    Physically abused: Not on file    Forced sexual activity: Not on file  Other Topics Concern  . Not on file  Social History Narrative   10/25/16 lives at [place]  [phone]   Caffeine - sodas, 2-3 daily    FAMILY HISTORY: Family History  Problem Relation Age of Onset  . Heart attack Father   . Diabetes Father     ALLERGIES:  is allergic to ace inhibitors and namzaric [memantine hcl-donepezil hcl].  MEDICATIONS:  Current Outpatient Medications  Medication Sig Dispense Refill  . atorvastatin (LIPITOR) 20 MG tablet TAKE 1 TABLET (20 MG TOTAL) BY MOUTH DAILY. 30 tablet 0  . Calcium Carbonate-Vitamin D (CALCIUM-VITAMIN D) 500-200 MG-UNIT per tablet Take 1 tablet by mouth daily.     . carvedilol (COREG) 6.25 MG tablet Take 1 tablet (6.25 mg total) by mouth 2 (two) times daily with a meal. 180 tablet 1  . Cholecalciferol (VITAMIN D) 1000 UNITS capsule Take 1,000 Units by mouth at bedtime.     . fluticasone (FLONASE) 50 MCG/ACT nasal spray Place 2 sprays into both nostrils daily as needed.     . irbesartan (AVAPRO) 300 MG tablet Take 1 tablet (300 mg total) by mouth daily. 30 tablet 3  . KLOR-CON M20 20 MEQ tablet TAKE 1  TABLET EVERY DAY (Patient taking differently: TAKE 1 TABLET EVERY OTHER DAY) 90 tablet 3  . Multiple Vitamins-Minerals (PRESERVISION AREDS PO) Take 1 tablet by mouth daily.    . romiPLOStim (NPLATE) 250 MCG injection Inject 1 mcg/kg into the skin as needed (every 2-3 weeks for low platelets). Given @ Oncology    . sertraline (ZOLOFT) 25 MG tablet TAKE 1 TABLET BY MOUTH EVERY DAY 30 tablet 0  .   spironolactone (ALDACTONE) 25 MG tablet Take 25 mg by mouth daily.     No current facility-administered medications for this visit.     REVIEW OF SYSTEMS:  Constitutional: Denies fevers, chills or abnormal night sweats  Eyes: Denies blurriness of vision, double vision or watery eyes  Ears, nose, mouth, throat, and face: Denies mucositis or sore throat Respiratory: Denies cough, dyspnea or wheezes Cardiovascular: Denies palpitation, chest discomfort or lower extremity swelling Gastrointestinal:  Denies nausea, heartburn or change in bowel habits Skin: Denies abnormal skin rashes Lymphatics: Denies new lymphadenopathy or easy bruising Musculoskeletal:  Neurological:Denies numbness, tingling or new weaknesses Behavioral/Psych: Mood is stable, no new changes  All other systems were reviewed with the patient and are negative.  PHYSICAL EXAMINATION:  ECOG PERFORMANCE STATUS: 2  Vitals:   01/29/18 0919  BP: (!) 165/69  Pulse: 60  Resp: 20  Temp: 98.2 F (36.8 C)  SpO2: 96%   Filed Weights   01/29/18 0919  Weight: 106 lb 12.8 oz (48.4 kg)    GENERAL:alert, no distress and comfortable. Able to get on exam table with assistance. Uses cane to ambulate. SKIN: skin color, texture, turgor are normal, no rashes or significant lesions EYES: normal, conjunctiva are pink and non-injected, sclera clear OROPHARYNX:no exudate, no erythema and lips, buccal mucosa, and tongue normal  NECK: supple, thyroid normal size, non-tender, without nodularity LYMPH:  no palpable lymphadenopathy in the cervical,  axillary or inguinal LUNGS: clear to auscultation and percussion with normal breathing effort HEART: regular rate & rhythm and no murmurs and no lower extremity edema ABDOMEN:abdomen soft, non-tender and normal bowel sounds Musculoskeletal:no cyanosis of digits and no clubbing   PSYCH: alert & oriented x 3 with fluent speech NEURO: no focal motor/sensory deficits   LABORATORY DATA:  I have reviewed the data as listed CBC Latest Ref Rng & Units 01/29/2018 12/22/2017 11/06/2017  WBC 3.9 - 10.3 K/uL 4.9 5.8 6.8  Hemoglobin 11.6 - 15.9 g/dL 13.4 12.8 13.9  Hematocrit 34.8 - 46.6 % 38.9 37.8 40.1  Platelets 145 - 400 K/uL 37(L) 43(L) 56(L)   CMP Latest Ref Rng & Units 11/06/2017 07/14/2017 06/06/2017  Glucose 70 - 140 mg/dL 104 114 118  BUN 7 - 26 mg/dL 13 15.2 20.1  Creatinine 0.60 - 1.10 mg/dL 1.25(H) 1.3(H) 1.3(H)  Sodium 136 - 145 mmol/L 135(L) 140 138  Potassium 3.5 - 5.1 mmol/L 4.6 4.4 4.6  Chloride 98 - 109 mmol/L 101 - -  CO2 22 - 29 mmol/L 27 24 28  Calcium 8.4 - 10.4 mg/dL 9.5 9.2 9.8  Total Protein 6.4 - 8.3 g/dL 6.2(L) 6.2(L) 6.8  Total Bilirubin 0.2 - 1.2 mg/dL 1.4(H) 1.46(H) 1.68(H)  Alkaline Phos 40 - 150 U/L 95 82 96  AST 5 - 34 U/L 26 27 28  ALT 0 - 55 U/L 17 15 20   PATHOLOGY REPORT  Diagnosis 02/25/2013 Bone Marrow Biopsy, left - NORMOCELLULAR MARROW WITH TRILINEAGE HEMATOPOIESIS AND MATURATION. - NO MORPHOLOGIC EVIDENCE OF CLONAL STEM CELL DISORDER OR LYMPHOMA. - SEE COMMENT. PERIPHERAL BLOOD: - THROMBOCYTOPENIA. Diagnosis Note The bone marrow is largely unremarkable. There is no morphologic evidence of a clonal stem cell process or the patient's prior lymphoma. Compared to the patient's previous bone marrow ([id number]), there has been a mild decrease in cellularity and number of megakaryocytes. There is no morphologic etiology for the patient's progressive thrombocytopenia in the current marrow.  RADIOGRAPHIC STUDIES: I have personally reviewed the  radiological images as listed and agreed with the   findings in the report. No results found.  ASSESSMENT & PLAN: 84 y.o. female   1. Idiopathic Thrombocytopenia Purpura (ITP) -I previously reviewed her outside records extensively including prior bone marrow biopsy -She has no abnormal bleeding or bruising -Thrombocytopenia has been well managed with Nplate. She previously received Nplate injections every 4 to 5 weeks. She also frequently rescheduled her appointment due to her transportation issue.  -I changed her schedule for lab and injection every 3 weeks to keep her plt>50K. I previously lowered her dose to 18m/kg   -I previously suggested she change her injection to oral pill Promacta so she is less likely to miss treatment. Her care giver feels it's better for her to continue injection and she will bring her to the appointments, and her daughter is also concerned about the high cost of Promacta  -Labs reviewed, Platelet dropped to 37K today, her last injection was 5 weeks ago,  will proceed with NPlate injection today. -I had a lengthy discussion with pt and her caregiver about setting her injection appointments and being compliant with the appointment. They understand. I will set up 6 months of appointments.  -She will contact our office if she has any abnormal bruising or bleeding  We will continue injections every 3 weeks for now.  - f/u in 3 months   2. Decreased appetite -Patient's caregiver is worried about her low appetite. I previously suggested drinking more of a dietary supplement, Boost or Glucerna, to help with this, I encouraged her to go to dinning room and do not skip meals  -Stable with no significant weight loss  3. Hypertension, DM  -She will follow with her primary care physician -She follows with cardiologist Dr. KClaiborne Billings-She will continue monitoring her blood pressure and blood glucose.  -BP at 165/69 today (01/29/18)  4. Falls -She previously was falling more often.  I previously recommended she ambulate with a walker and since using it she has not had falls recently.   5. Dementia -Her primary care physician has started her on medication for dementia, but both her niece and caregiver are concerned about the side effect from the medication, and has not been giving her.  Follow up: -Labs reviewed. We will continue with Nplate injection 169GE/XBtoday (dose adjust if needed) -Lab, and injection every 3 weeksX10 -F/u in 12 and 24 weeks   No orders of the defined types were placed in this encounter.   All questions were answered. The patient knows to call the clinic with any problems, questions or concerns.  I spent 15 minutes counseling the patient face to face. The total time spent in the appointment was 20 minutes and more than 50% was on counseling.  IOneal Deputy am acting as scribe for YTruitt Merle MD.   I have reviewed the above documentation for accuracy and completeness, and I agree with the above.      YTruitt Merle MD 01/29/2018

## 2018-01-29 ENCOUNTER — Inpatient Hospital Stay: Payer: Medicare Other | Attending: Hematology

## 2018-01-29 ENCOUNTER — Telehealth: Payer: Self-pay | Admitting: Hematology

## 2018-01-29 ENCOUNTER — Encounter: Payer: Self-pay | Admitting: Hematology

## 2018-01-29 ENCOUNTER — Inpatient Hospital Stay (HOSPITAL_BASED_OUTPATIENT_CLINIC_OR_DEPARTMENT_OTHER): Payer: Medicare Other | Admitting: Hematology

## 2018-01-29 ENCOUNTER — Inpatient Hospital Stay: Payer: Medicare Other

## 2018-01-29 VITALS — BP 165/69 | HR 60 | Temp 98.2°F | Resp 20 | Ht 60.0 in | Wt 106.8 lb

## 2018-01-29 DIAGNOSIS — R296 Repeated falls: Secondary | ICD-10-CM | POA: Diagnosis not present

## 2018-01-29 DIAGNOSIS — I10 Essential (primary) hypertension: Secondary | ICD-10-CM

## 2018-01-29 DIAGNOSIS — F0390 Unspecified dementia without behavioral disturbance: Secondary | ICD-10-CM | POA: Diagnosis not present

## 2018-01-29 DIAGNOSIS — R630 Anorexia: Secondary | ICD-10-CM

## 2018-01-29 DIAGNOSIS — D693 Immune thrombocytopenic purpura: Secondary | ICD-10-CM | POA: Diagnosis not present

## 2018-01-29 DIAGNOSIS — E119 Type 2 diabetes mellitus without complications: Secondary | ICD-10-CM | POA: Diagnosis not present

## 2018-01-29 LAB — CBC WITH DIFFERENTIAL/PLATELET
BASOS ABS: 0 10*3/uL (ref 0.0–0.1)
BASOS PCT: 1 %
Eosinophils Absolute: 0.6 10*3/uL — ABNORMAL HIGH (ref 0.0–0.5)
Eosinophils Relative: 12 %
HEMATOCRIT: 38.9 % (ref 34.8–46.6)
Hemoglobin: 13.4 g/dL (ref 11.6–15.9)
LYMPHS PCT: 19 %
Lymphs Abs: 0.9 10*3/uL (ref 0.9–3.3)
MCH: 34.5 pg — ABNORMAL HIGH (ref 25.1–34.0)
MCHC: 34.3 g/dL (ref 31.5–36.0)
MCV: 100.4 fL (ref 79.5–101.0)
Monocytes Absolute: 0.4 10*3/uL (ref 0.1–0.9)
Monocytes Relative: 9 %
NEUTROS ABS: 2.9 10*3/uL (ref 1.5–6.5)
Neutrophils Relative %: 59 %
PLATELETS: 37 10*3/uL — AB (ref 145–400)
RBC: 3.87 MIL/uL (ref 3.70–5.45)
RDW: 13.8 % (ref 11.2–14.5)
WBC: 4.9 10*3/uL (ref 3.9–10.3)

## 2018-01-29 MED ORDER — ROMIPLOSTIM INJECTION 500 MCG
500.0000 ug | Freq: Once | SUBCUTANEOUS | Status: AC
  Administered 2018-01-29: 500 ug via SUBCUTANEOUS
  Filled 2018-01-29: qty 1

## 2018-01-29 NOTE — Telephone Encounter (Signed)
Appointments scheduled AVS declined Calendar printed per 7/11 los °

## 2018-02-19 ENCOUNTER — Inpatient Hospital Stay: Payer: Medicare Other | Attending: Hematology

## 2018-02-19 ENCOUNTER — Inpatient Hospital Stay: Payer: Medicare Other

## 2018-02-19 ENCOUNTER — Telehealth: Payer: Self-pay | Admitting: Hematology

## 2018-02-19 VITALS — BP 198/87 | HR 69 | Temp 98.2°F | Resp 20

## 2018-02-19 DIAGNOSIS — D693 Immune thrombocytopenic purpura: Secondary | ICD-10-CM | POA: Insufficient documentation

## 2018-02-19 LAB — CBC WITH DIFFERENTIAL/PLATELET
Basophils Absolute: 0 10*3/uL (ref 0.0–0.1)
Basophils Relative: 1 %
EOS ABS: 0.3 10*3/uL (ref 0.0–0.5)
EOS PCT: 6 %
HCT: 38.3 % (ref 34.8–46.6)
Hemoglobin: 13 g/dL (ref 11.6–15.9)
LYMPHS ABS: 0.8 10*3/uL — AB (ref 0.9–3.3)
LYMPHS PCT: 18 %
MCH: 34.2 pg — AB (ref 25.1–34.0)
MCHC: 33.9 g/dL (ref 31.5–36.0)
MCV: 100.8 fL (ref 79.5–101.0)
MONOS PCT: 9 %
Monocytes Absolute: 0.4 10*3/uL (ref 0.1–0.9)
Neutro Abs: 3.1 10*3/uL (ref 1.5–6.5)
Neutrophils Relative %: 66 %
Platelets: 134 10*3/uL — ABNORMAL LOW (ref 145–400)
RBC: 3.8 MIL/uL (ref 3.70–5.45)
RDW: 13.6 % (ref 11.2–14.5)
WBC: 4.7 10*3/uL (ref 3.9–10.3)

## 2018-02-19 MED ORDER — ROMIPLOSTIM INJECTION 500 MCG
500.0000 ug | Freq: Once | SUBCUTANEOUS | Status: AC
  Administered 2018-02-19: 500 ug via SUBCUTANEOUS
  Filled 2018-02-19: qty 1

## 2018-02-19 NOTE — Patient Instructions (Signed)
Romiplostim injection What is this medicine? ROMIPLOSTIM (roe mi PLOE stim) helps your body make more platelets. This medicine is used to treat low platelets caused by chronic idiopathic thrombocytopenic purpura (ITP). This medicine may be used for other purposes; ask your health care provider or pharmacist if you have questions. COMMON BRAND NAME(S): Nplate What should I tell my health care provider before I take this medicine? They need to know if you have any of these conditions: -cancer or myelodysplastic syndrome -low blood counts, like low white cell, platelet, or red cell counts -take medicines that treat or prevent blood clots -an unusual or allergic reaction to romiplostim, mannitol, other medicines, foods, dyes, or preservatives -pregnant or trying to get pregnant -breast-feeding How should I use this medicine? This medicine is for injection under the skin. It is given by a health care professional in a hospital or clinic setting. A special MedGuide will be given to you before your injection. Read this information carefully each time. Talk to your pediatrician regarding the use of this medicine in children. Special care may be needed. Overdosage: If you think you have taken too much of this medicine contact a poison control center or emergency room at once. NOTE: This medicine is only for you. Do not share this medicine with others. What if I miss a dose? It is important not to miss your dose. Call your doctor or health care professional if you are unable to keep an appointment. What may interact with this medicine? Interactions are not expected. This list may not describe all possible interactions. Give your health care provider a list of all the medicines, herbs, non-prescription drugs, or dietary supplements you use. Also tell them if you smoke, drink alcohol, or use illegal drugs. Some items may interact with your medicine. What should I watch for while using this  medicine? Your condition will be monitored carefully while you are receiving this medicine. Visit your prescriber or health care professional for regular checks on your progress and for the needed blood tests. It is important to keep all appointments. What side effects may I notice from receiving this medicine? Side effects that you should report to your doctor or health care professional as soon as possible: -allergic reactions like skin rash, itching or hives, swelling of the face, lips, or tongue -shortness of breath, chest pain, swelling in a leg -unusual bleeding or bruising Side effects that usually do not require medical attention (report to your doctor or health care professional if they continue or are bothersome): -dizziness -headache -muscle aches -pain in arms and legs -stomach pain -trouble sleeping This list may not describe all possible side effects. Call your doctor for medical advice about side effects. You may report side effects to FDA at 1-800-FDA-1088. Where should I keep my medicine? This drug is given in a hospital or clinic and will not be stored at home. NOTE: This sheet is a summary. It may not cover all possible information. If you have questions about this medicine, talk to your doctor, pharmacist, or health care provider.  2018 Elsevier/Gold Standard (2008-03-07 15:13:04)  

## 2018-02-19 NOTE — Telephone Encounter (Signed)
Patients sitter has requested to reschedule 8/22 appt to 8/20 due to her being out of town/  Appts moved as requested

## 2018-03-10 ENCOUNTER — Inpatient Hospital Stay: Payer: Medicare Other

## 2018-03-12 ENCOUNTER — Other Ambulatory Visit: Payer: Medicare Other

## 2018-03-12 ENCOUNTER — Ambulatory Visit: Payer: Medicare Other

## 2018-04-02 ENCOUNTER — Inpatient Hospital Stay: Payer: Medicare Other | Attending: Hematology

## 2018-04-02 ENCOUNTER — Inpatient Hospital Stay: Payer: Medicare Other

## 2018-04-02 VITALS — BP 164/66 | HR 61 | Temp 98.1°F | Resp 18

## 2018-04-02 DIAGNOSIS — D693 Immune thrombocytopenic purpura: Secondary | ICD-10-CM

## 2018-04-02 LAB — CBC WITH DIFFERENTIAL/PLATELET
Basophils Absolute: 0 10*3/uL (ref 0.0–0.1)
Basophils Relative: 0 %
EOS ABS: 0.2 10*3/uL (ref 0.0–0.5)
EOS PCT: 6 %
HCT: 36.1 % (ref 34.8–46.6)
Hemoglobin: 12.4 g/dL (ref 11.6–15.9)
LYMPHS PCT: 24 %
Lymphs Abs: 1 10*3/uL (ref 0.9–3.3)
MCH: 33.9 pg (ref 25.1–34.0)
MCHC: 34.3 g/dL (ref 31.5–36.0)
MCV: 98.6 fL (ref 79.5–101.0)
MONO ABS: 0.5 10*3/uL (ref 0.1–0.9)
MONOS PCT: 12 %
Neutro Abs: 2.5 10*3/uL (ref 1.5–6.5)
Neutrophils Relative %: 58 %
PLATELETS: 26 10*3/uL — AB (ref 145–400)
RBC: 3.66 MIL/uL — ABNORMAL LOW (ref 3.70–5.45)
RDW: 13.9 % (ref 11.2–14.5)
WBC: 4.2 10*3/uL (ref 3.9–10.3)

## 2018-04-02 MED ORDER — ROMIPLOSTIM INJECTION 500 MCG
500.0000 ug | Freq: Once | SUBCUTANEOUS | Status: AC
  Administered 2018-04-02: 500 ug via SUBCUTANEOUS
  Filled 2018-04-02: qty 1

## 2018-04-16 ENCOUNTER — Inpatient Hospital Stay: Payer: Medicare Other

## 2018-04-20 NOTE — Progress Notes (Signed)
[place]  [phone] Fax:([phone]) [phone]  Clinic Follow Up Note   Patient Care Team: Merrilee Seashore, MD as PCP - General (Internal Medicine) Truitt Merle, MD as Consulting Physician (Hematology)   Date of Service:  04/23/2018  CHIEF COMPLAINTS:  Follow up ITP   HISTORY OF PRESENTING ILLNESS (06/21/2016):  Lisa James 84 y.o. female was previously seen for immune thrombocytopenia care by Dr. Marin Olp. Her thrombocytopenia is well managed with Nplate. She received Nplate every 4 to 5 weeks. Her last visit and injection in his office was on 05/15/2016.   The patient is here to establish care because our office is more convenient for transportation. She is accompanied by her caregiver. She does not recall how long she has had platelet management care. She does not recall if she took steroids in the past. She denies gum bleeding and has false teeth. She denies nose bleeding, hematuria, or blood in stool. She denies any pain, bowel issue, or breathing issue.   She lives in independent living. Her care giver notes the patient "eats like a bird". She does eat a particularly well rounded diet. Her care giver has provided her with Boost/Ensure to help maintain her weight instead of sodas. The patient enjoys these drinks. Her care giver works with her 7 days a week.   She recently fell at her niece's house at Thanksgiving and twisted her ankle. This is healing well. She also fell at her cardiologist's office on 06/18/16 and hit her head on a water fountain. She often falls at the doctor's office. She recently began using a cane to ambulate. She does not use a walker.   She does not smoke or drink.    CURRENT THERAPY: Nplate 71mg/kg every 3 weeks to maintain plt>50K and <400K and dose adjust as needed. Will change to Promacta 5102mdaily in 3 weeks    Interval History:   Lisa TURKINGTONeturns for follow up for her ITP. She was last seen by me in  July. Today, she is here with her caregiver. [facility] and will therefore have a new caregiver. She missed her August appointment because the facility can bring them on specific days that don't always match the caregivers schedule.     MEDICAL HISTORY:  Past Medical History:  Diagnosis Date  . Acute MI, lateral wall (HC[facility] [place]  . Aortic stenosis   . Chronic kidney disease   . Chronic systolic heart failure (HCM[place]  . Coronary artery disease    sees Dr KeClaiborne Billingsvery 6 months  . Dementia (HCH[place]  . Diabetes (HCGray  . DM (diabetes mellitus) (HCGraham   type 2 ; diet controlled  . Heart disease   . HTN (hypertension)   . Immune thrombocytopenic purpura (HCBurns7/09/2012  . Ischemic heart disease   . Kidney disease   . Mitral regurgitation   . Non Hodgkin's lymphoma (HCFunkley   of the throat  . Subdural hematoma, post-traumatic ([facility]2015   sees Dr NuSherwood Gamblervery 6 months  . Thrombocytopenia (HCSouth [place]   sees Dr EnMarin Olpvery 2 weeks ;receives Nplate prn    SURGICAL HISTORY: Past Surgical History:  Procedure Laterality Date  . BONE MARROW BIOPSY  11/22/2002   left posterior iliac crest bone marrow biopsy and aspirate  . CATARACT EXTRACTION W/ INTRAOCULAR LENS  IMPLANT, BILATERAL Bilateral   . CORONARY ARTERY BYPASS GRAFT  11/20/2007   x5  .  EYE SURGERY    . submental lymph node excisional biopsy  10/22/2002    SOCIAL HISTORY: Social History   Socioeconomic History  . Marital status: Widowed    Spouse name: Not on file  . Number of children: 2  . Years of education: Not on file  . Highest education level: Not on file  Occupational History    Comment: retired  Scientific laboratory technician  . Financial resource strain: Not on file  . Food insecurity:    Worry: Not on file    Inability: Not on file  . Transportation needs:    Medical: Not on file    Non-medical: Not on file  Tobacco Use  . Smoking status: Never Smoker  . Smokeless tobacco: Never Used  .  Tobacco comment: never used tobacco.  Substance and Sexual Activity  . Alcohol use: No    Alcohol/week: 0.0 standard drinks  . Drug use: No  . Sexual activity: Never    Birth control/protection: Abstinence  Lifestyle  . Physical activity:    Days per week: Not on file    Minutes per session: Not on file  . Stress: Not on file  Relationships  . Social connections:    Talks on phone: Not on file    Gets together: Not on file    Attends religious service: Not on file    Active member of club or organization: Not on file    Attends meetings of clubs or organizations: Not on file    Relationship status: Not on file  . Intimate partner violence:    Fear of current or ex partner: Not on file    Emotionally abused: Not on file    Physically abused: Not on file    Forced sexual activity: Not on file  Other Topics Concern  . Not on file  Social History Narrative   10/25/16 lives at [facility]  [phone]   Caffeine - sodas, 2-3 daily    FAMILY HISTORY: Family History  Problem Relation Age of Onset  . Heart attack Father   . Diabetes Father     ALLERGIES:  is allergic to ace inhibitors and namzaric [memantine hcl-donepezil hcl].  MEDICATIONS:  Current Outpatient Medications  Medication Sig Dispense Refill  . atorvastatin (LIPITOR) 20 MG tablet TAKE 1 TABLET (20 MG TOTAL) BY MOUTH DAILY. 30 tablet 0  . Calcium Carbonate-Vitamin D (CALCIUM-VITAMIN D) 500-200 MG-UNIT per tablet Take 1 tablet by mouth daily.     . carvedilol (COREG) 6.25 MG tablet Take 1 tablet (6.25 mg total) by mouth 2 (two) times daily with a meal. 180 tablet 1  . Cholecalciferol (VITAMIN D) 1000 UNITS capsule Take 1,000 Units by mouth at bedtime.     . fluticasone (FLONASE) 50 MCG/ACT nasal spray Place 2 sprays into both nostrils daily as needed.     . irbesartan (AVAPRO) 300 MG tablet Take 1 tablet (300 mg total) by mouth daily. 30 tablet 3  . KLOR-CON M20 20 MEQ tablet TAKE 1 TABLET EVERY DAY (Patient  taking differently: TAKE 1 TABLET EVERY OTHER DAY) 90 tablet 3  . Multiple Vitamins-Minerals (PRESERVISION AREDS PO) Take 1 tablet by mouth daily.    . romiPLOStim (NPLATE) 250 MCG injection Inject 1 mcg/kg into the skin as needed (every 2-3 weeks for low platelets). Given @ Oncology    . sertraline (ZOLOFT) 25 MG tablet TAKE 1 TABLET BY MOUTH EVERY DAY 30 tablet 0  . spironolactone (ALDACTONE) 25 MG tablet Take 25 mg  by mouth daily.    Marland Kitchen eltrombopag (PROMACTA) 50 MG tablet Take 1 tablet (50 mg total) by mouth daily. Take on an empty stomach 1 hour before a meal or 2 hours after 30 tablet 0   No current facility-administered medications for this visit.    Facility-Administered Medications Ordered in Other Visits  Medication Dose Route Frequency Provider Last Rate Last Dose  . romiPLOStim (NPLATE) injection 500 mcg  500 mcg Subcutaneous Once Truitt Merle, MD        REVIEW OF SYSTEMS:  Constitutional: Denies fevers, chills or abnormal night sweats  Eyes: Denies blurriness of vision, double vision or watery eyes  Ears, nose, mouth, throat, and face: Denies mucositis or sore throat Respiratory: Denies cough, dyspnea or wheezes Cardiovascular: Denies palpitation, chest discomfort or lower extremity swelling Gastrointestinal:  Denies nausea, heartburn or change in bowel habits Skin: Denies abnormal skin rashes Lymphatics: Denies new lymphadenopathy or easy bruising Musculoskeletal:  Neurological:Denies numbness, tingling or new weaknesses Behavioral/Psych: Mood is stable, no new changes  All other systems were reviewed with the patient and are negative.  PHYSICAL EXAMINATION:  ECOG PERFORMANCE STATUS: 2  Vitals:   04/23/18 0924  BP: (!) 152/76  Pulse: 60  Resp: 17  Temp: 97.8 F (36.6 C)  SpO2: 98%   Filed Weights   04/23/18 0924  Weight: 106 lb 6.4 oz (48.3 kg)    GENERAL:alert, no distress and comfortable. Able to get on exam table with assistance. Uses cane to ambulate. SKIN:  skin color, texture, turgor are normal, no rashes or significant lesions EYES: normal, conjunctiva are pink and non-injected, sclera clear OROPHARYNX:no exudate, no erythema and lips, buccal mucosa, and tongue normal  NECK: supple, thyroid normal size, non-tender, without nodularity LYMPH:  no palpable lymphadenopathy in the cervical, axillary or inguinal LUNGS: clear to auscultation and percussion with normal breathing effort HEART: regular rate & rhythm and no murmurs and no lower extremity edema ABDOMEN:abdomen soft, non-tender and normal bowel sounds Musculoskeletal:no cyanosis of digits and no clubbing   PSYCH: alert & oriented x 3 with fluent speech NEURO: no focal motor/sensory deficits   LABORATORY DATA:  I have reviewed the data as listed CBC Latest Ref Rng & Units 04/23/2018 04/02/2018 02/19/2018  WBC 3.9 - 10.3 K/uL 4.7 4.2 4.7  Hemoglobin 11.6 - 15.9 g/dL 13.5 12.4 13.0  Hematocrit 34.8 - 46.6 % 39.7 36.1 38.3  Platelets 145 - 400 K/uL 135(L) 26(L) 134(L)   CMP Latest Ref Rng & Units 04/23/2018 11/06/2017 07/14/2017  Glucose 70 - 99 mg/dL 112(H) 104 114  BUN 8 - 23 mg/dL 13 13 15.2  Creatinine 0.44 - 1.00 mg/dL 1.23(H) 1.25(H) 1.3(H)  Sodium 135 - 145 mmol/L 136 135(L) 140  Potassium 3.5 - 5.1 mmol/L 4.9 4.6 4.4  Chloride 98 - 111 mmol/L 99 101 -  CO2 22 - 32 mmol/L _0 Calcium 8.9 - 10.3 mg/dL 9.5 9.5 9.2  Total Protein 6.5 - 8.1 g/dL 6.1(L) 6.2(L) 6.2(L)  Total Bilirubin 0.3 - 1.2 mg/dL 1.1 1.4(H) 1.46(H)  Alkaline Phos 38 - 126 U/L 96 95 82  AST 15 - 41 U/L 34 26 27  ALT 0 - 44 U/L _1 PATHOLOGY REPORT  Diagnosis 02/25/2013 Bone Marrow Biopsy, left - NORMOCELLULAR MARROW WITH TRILINEAGE HEMATOPOIESIS AND MATURATION. - NO MORPHOLOGIC EVIDENCE OF CLONAL STEM CELL DISORDER OR LYMPHOMA. - SEE COMMENT. PERIPHERAL BLOOD: - THROMBOCYTOPENIA. Diagnosis Note The bone marrow is largely unremarkable. There is no morphologic evidence  of a clonal stem cell process  or the patient's prior lymphoma. Compared to the patient's previous bone marrow ([id number]), there has been a mild decrease in cellularity and number of megakaryocytes. There is no morphologic etiology for the patient's progressive thrombocytopenia in the current marrow.  RADIOGRAPHIC STUDIES: I have personally reviewed the radiological images as listed and agreed with the findings in the report. No results found.  ASSESSMENT & PLAN:  84 y.o. female   1. Idiopathic Thrombocytopenia Purpura (ITP) -I previously reviewed her outside records extensively including prior bone marrow biopsy -She has no abnormal bleeding or bruising -Thrombocytopenia has been well managed with Nplate. She previously received Nplate injections every 4 to 5 weeks. She also frequently rescheduled her appointment due to her transportation issue.  -I changed her schedule for lab and injection every 3 weeks to keep her plt>50K. I previously lowered her dose to 71m/kg   -I previously suggested she change her injection to oral pill Promacta so she is less likely to miss treatment. Her care giver feels it's better for her to continue injection and she will bring her to the appointments, and her NSaddie Bendersis also concerned about the high cost of Promacta. -I previously had a lengthy discussion with pt and her caregiver about setting her injection appointments and being compliant with the appointment. They understand.  -She is moving to a [facility] and will be the memory unit due to her worsening dementia. -Labs reviewed, Platelet 135K today, will proceed with Nplate injection today -I am concerned about her reliability of follow-up and transportation, I recommend her to switch Nplate to Promacta, which is oral drug.  Potential benefit and side effects discussed with patient and her niece (one the phone), she agrees to switch.  I will write a prescription today and process through our oral pharmacy, I  will give her the first prescription shipment on her next visit in 3 weeks. - f/u in 3 weeks with injection   2. Decreased appetite -Patient's caregiver is worried about her low appetite. I previously suggested drinking more of a dietary supplement, Boost or Glucerna, to help with this, I encouraged her to go to dinning room and do not skip meals  -Stable with no significant weight loss  3. Hypertension, DM  -She will follow with her primary care physician -She follows with cardiologist Dr. KClaiborne Billings-She will continue monitoring her blood pressure and blood glucose.   4. Falls -She previously was falling more often. I previously recommended she ambulate with a walker and since using it she has not had falls recently.   5. Dementia -Her primary care physician has started her on medication for dementia, but both her niece and caregiver are concerned about the side effect from the medication, and has not been giving her.  Follow up: -Labs reviewed. We will continue with Nplate injection today and in 3 weeks  -Lab, f/u with Lacie and injection in 3 weeks (after 9am)  -will prescribe Promacta 50 mg daily today, and will send to the first shipment to our office, and I will give to her on next visit.  No orders of the defined types were placed in this encounter.   All questions were answered. The patient knows to call the clinic with any problems, questions or concerns.  I spent 20 minutes counseling the patient face to face. The total time spent in the appointment was 25 minutes and more than 50% was on counseling.  IDierdre SearlesDweik  am acting as scribe for Dr. Truitt Merle.  I have reviewed the above documentation for accuracy and completeness, and I agree with the above.      Truitt Merle, MD 04/23/2018

## 2018-04-23 ENCOUNTER — Inpatient Hospital Stay (HOSPITAL_BASED_OUTPATIENT_CLINIC_OR_DEPARTMENT_OTHER): Payer: Medicare Other | Admitting: Hematology

## 2018-04-23 ENCOUNTER — Inpatient Hospital Stay: Payer: Medicare Other

## 2018-04-23 ENCOUNTER — Telehealth: Payer: Self-pay | Admitting: Hematology

## 2018-04-23 ENCOUNTER — Encounter: Payer: Self-pay | Admitting: Hematology

## 2018-04-23 ENCOUNTER — Inpatient Hospital Stay: Payer: Medicare Other | Attending: Hematology

## 2018-04-23 VITALS — BP 152/76 | HR 60 | Temp 97.8°F | Resp 17 | Ht 60.0 in | Wt 106.4 lb

## 2018-04-23 DIAGNOSIS — I10 Essential (primary) hypertension: Secondary | ICD-10-CM | POA: Insufficient documentation

## 2018-04-23 DIAGNOSIS — R630 Anorexia: Secondary | ICD-10-CM

## 2018-04-23 DIAGNOSIS — D693 Immune thrombocytopenic purpura: Secondary | ICD-10-CM

## 2018-04-23 DIAGNOSIS — F0390 Unspecified dementia without behavioral disturbance: Secondary | ICD-10-CM | POA: Diagnosis not present

## 2018-04-23 DIAGNOSIS — E119 Type 2 diabetes mellitus without complications: Secondary | ICD-10-CM

## 2018-04-23 DIAGNOSIS — Z79899 Other long term (current) drug therapy: Secondary | ICD-10-CM | POA: Diagnosis not present

## 2018-04-23 DIAGNOSIS — R296 Repeated falls: Secondary | ICD-10-CM

## 2018-04-23 LAB — CBC WITH DIFFERENTIAL/PLATELET
Basophils Absolute: 0 10*3/uL (ref 0.0–0.1)
Basophils Relative: 0 %
Eosinophils Absolute: 0.2 10*3/uL (ref 0.0–0.5)
Eosinophils Relative: 5 %
HEMATOCRIT: 39.7 % (ref 34.8–46.6)
HEMOGLOBIN: 13.5 g/dL (ref 11.6–15.9)
LYMPHS ABS: 0.7 10*3/uL — AB (ref 0.9–3.3)
Lymphocytes Relative: 16 %
MCH: 34 pg (ref 25.1–34.0)
MCHC: 34 g/dL (ref 31.5–36.0)
MCV: 100 fL (ref 79.5–101.0)
MONO ABS: 0.4 10*3/uL (ref 0.1–0.9)
MONOS PCT: 9 %
Neutro Abs: 3.3 10*3/uL (ref 1.5–6.5)
Neutrophils Relative %: 70 %
Platelets: 135 10*3/uL — ABNORMAL LOW (ref 145–400)
RBC: 3.97 MIL/uL (ref 3.70–5.45)
RDW: 14.2 % (ref 11.2–14.5)
WBC: 4.7 10*3/uL (ref 3.9–10.3)

## 2018-04-23 LAB — CMP (CANCER CENTER ONLY)
ALBUMIN: 3.9 g/dL (ref 3.5–5.0)
ALK PHOS: 96 U/L (ref 38–126)
ALT: 22 U/L (ref 0–44)
AST: 34 U/L (ref 15–41)
Anion gap: 7 (ref 5–15)
BUN: 13 mg/dL (ref 8–23)
CALCIUM: 9.5 mg/dL (ref 8.9–10.3)
CHLORIDE: 99 mmol/L (ref 98–111)
CO2: 30 mmol/L (ref 22–32)
CREATININE: 1.23 mg/dL — AB (ref 0.44–1.00)
GFR, EST AFRICAN AMERICAN: 44 mL/min — AB (ref >60)
GFR, Estimated: 38 mL/min — ABNORMAL LOW (ref >60)
GLUCOSE: 112 mg/dL — AB (ref 70–99)
Potassium: 4.9 mmol/L (ref 3.5–5.1)
SODIUM: 136 mmol/L (ref 135–145)
Total Bilirubin: 1.1 mg/dL (ref 0.3–1.2)
Total Protein: 6.1 g/dL — ABNORMAL LOW (ref 6.5–8.1)

## 2018-04-23 MED ORDER — ELTROMBOPAG OLAMINE 50 MG PO TABS: 50.0000 mg | ORAL_TABLET | Freq: Every day | ORAL | 0 refills | Status: DC

## 2018-04-23 MED ORDER — ROMIPLOSTIM INJECTION 500 MCG
500.0000 ug | Freq: Once | SUBCUTANEOUS | Status: AC
  Administered 2018-04-23: 500 ug via SUBCUTANEOUS
  Filled 2018-04-23: qty 1

## 2018-04-23 NOTE — Telephone Encounter (Signed)
Appts scheduled avs/calendar printed per 10/3 los °

## 2018-04-23 NOTE — Patient Instructions (Signed)
Romiplostim injection What is this medicine? ROMIPLOSTIM (roe mi PLOE stim) helps your body make more platelets. This medicine is used to treat low platelets caused by chronic idiopathic thrombocytopenic purpura (ITP). This medicine may be used for other purposes; ask your health care provider or pharmacist if you have questions. COMMON BRAND NAME(S): Nplate What should I tell my health care provider before I take this medicine? They need to know if you have any of these conditions: -cancer or myelodysplastic syndrome -low blood counts, like low white cell, platelet, or red cell counts -take medicines that treat or prevent blood clots -an unusual or allergic reaction to romiplostim, mannitol, other medicines, foods, dyes, or preservatives -pregnant or trying to get pregnant -breast-feeding How should I use this medicine? This medicine is for injection under the skin. It is given by a health care professional in a hospital or clinic setting. A special MedGuide will be given to you before your injection. Read this information carefully each time. Talk to your pediatrician regarding the use of this medicine in children. Special care may be needed. Overdosage: If you think you have taken too much of this medicine contact a poison control center or emergency room at once. NOTE: This medicine is only for you. Do not share this medicine with others. What if I miss a dose? It is important not to miss your dose. Call your doctor or health care professional if you are unable to keep an appointment. What may interact with this medicine? Interactions are not expected. This list may not describe all possible interactions. Give your health care provider a list of all the medicines, herbs, non-prescription drugs, or dietary supplements you use. Also tell them if you smoke, drink alcohol, or use illegal drugs. Some items may interact with your medicine. What should I watch for while using this  medicine? Your condition will be monitored carefully while you are receiving this medicine. Visit your prescriber or health care professional for regular checks on your progress and for the needed blood tests. It is important to keep all appointments. What side effects may I notice from receiving this medicine? Side effects that you should report to your doctor or health care professional as soon as possible: -allergic reactions like skin rash, itching or hives, swelling of the face, lips, or tongue -shortness of breath, chest pain, swelling in a leg -unusual bleeding or bruising Side effects that usually do not require medical attention (report to your doctor or health care professional if they continue or are bothersome): -dizziness -headache -muscle aches -pain in arms and legs -stomach pain -trouble sleeping This list may not describe all possible side effects. Call your doctor for medical advice about side effects. You may report side effects to FDA at 1-800-FDA-1088. Where should I keep my medicine? This drug is given in a hospital or clinic and will not be stored at home. NOTE: This sheet is a summary. It may not cover all possible information. If you have questions about this medicine, talk to your doctor, pharmacist, or health care provider.  2018 Elsevier/Gold Standard (2008-03-07 15:13:04)  

## 2018-04-24 ENCOUNTER — Telehealth: Payer: Self-pay

## 2018-04-24 ENCOUNTER — Telehealth: Payer: Self-pay | Admitting: Pharmacist

## 2018-04-24 DIAGNOSIS — D693 Immune thrombocytopenic purpura: Secondary | ICD-10-CM

## 2018-04-24 MED ORDER — ELTROMBOPAG OLAMINE 50 MG PO TABS: 50.0000 mg | ORAL_TABLET | Freq: Every day | ORAL | 0 refills | Status: DC

## 2018-04-24 NOTE — Telephone Encounter (Signed)
Oral Oncology Patient Advocate Encounter  Prior Authorization for Lisa James has been approved.    PA# [id number] Effective dates: 04/24/18 through 07/22/19  Oral Oncology Clinic will continue to follow.   [place] Patient [place] Phone [phone] Fax [phone]

## 2018-04-24 NOTE — Telephone Encounter (Signed)
Oral Oncology Patient Advocate Encounter  I called Anette, patient niece to discuss $100 copay. There is no funding available to help cover this right now. She is going to talk with Maelyn and the doctor when they come back in before they make a decision on whether they will pay this or not.   [place] Patient [place] Phone [phone] Fax [phone]

## 2018-04-24 NOTE — Telephone Encounter (Signed)
Oral Oncology Pharmacist Encounter  Received new prescription for Promacta (eltrombopag) for the treatment of chronic idiopathic thrombocytopenia, planned duration until optimal platelet control is achieved or unacceptable toxicity.  Patient previously treated with prednisone and rituximab. She is currently maintained in rombiplastin injections to maintain platelet count > 50k MD to change to oral Promacta for increased compliance due to transportation issues for injections at the clinic  Plan to start in 3 weeks and have available for patient to pick up from MD.  Labs from 04/23/2018 assessed Noted SCr=1.23, est CrCl ~ 20 mL/min (age=89, wt=43.8kg), no dose adjustment for renal function per manufacturer  Current medication list in Epic reviewed, DDIs with Promacta identified:  Promacta and calcium supplement and multivitamin: Patient will be counseled to separate Promacta administrationat least 2 hours before or 4 hours after calcium supplement and multivitamin as Promacta forms covalent complex with polyvalent cations leading to decreased absorption of all agents.  Prescription has been e-scribed to the [facility] for benefits analysis and approval.  I will plan to counsel patient when in the office on 05/14/18 on Promacta initiation.  Oral Oncology Clinic will continue to follow for insurance authorization, copayment issues, initial counseling and start date.  Johny Drilling, PharmD, BCPS, BCOP  04/24/2018 7:48 AM Oral Oncology Clinic [phone]

## 2018-04-24 NOTE — Telephone Encounter (Signed)
Oral Oncology Patient Advocate Encounter  Received notification from [employer]  that prior authorization for Promacta is required.  PA submitted on CoverMyMeds Key [id number] Status is pending  Oral Oncology Clinic will continue to follow.  [place] Patient Lisa James Phone [phone] Fax [phone]

## 2018-04-27 ENCOUNTER — Encounter: Payer: Self-pay | Admitting: Hematology

## 2018-04-30 ENCOUNTER — Other Ambulatory Visit: Payer: Medicare Other

## 2018-04-30 ENCOUNTER — Ambulatory Visit: Payer: Medicare Other

## 2018-05-01 ENCOUNTER — Telehealth: Payer: Self-pay

## 2018-05-01 NOTE — Telephone Encounter (Signed)
Oral Oncology Patient Advocate Encounter  Dr. Burr Medico came and spoke to me about Promacta, she would like for her to get it within the next week. Dr. Burr Medico wants the patient to call the office when she receives the medicine so she can confirm and ok the medicine to start. The patient would like for it to be shipped to [facility]. I confirmed with Regina that this would be ok. Denyse Amass is out of the office so I talked to Frederick about this and determined we would wait for Denyse Amass to return on Monday to counsel her and get this set up to ship on Monday and be delivered to the patient on Tuesday.  Will continue to update  [place] Patient [place] Phone [phone] Fax [phone]

## 2018-05-04 ENCOUNTER — Telehealth: Payer: Self-pay | Admitting: Pharmacist

## 2018-05-04 ENCOUNTER — Telehealth: Payer: Self-pay

## 2018-05-04 DIAGNOSIS — D693 Immune thrombocytopenic purpura: Secondary | ICD-10-CM

## 2018-05-04 MED ORDER — ELTROMBOPAG OLAMINE 50 MG PO TABS: 50.0000 mg | ORAL_TABLET | Freq: Every day | ORAL | 0 refills | Status: DC

## 2018-05-04 MED FILL — PROMACTA 50 MG TABLET: 50 | 30 days supply | Qty: 30 | Fill #0

## 2018-05-04 NOTE — Telephone Encounter (Signed)
Spoke with Anne Ng patient's niece, order sent to [facility] to start Promacta and to draw labs, per Dr. Burr Medico will need to see patient in 2 months, she verbalized an understanding.

## 2018-05-04 NOTE — Telephone Encounter (Signed)
OK, if she starts this week, I will reschedule her next appointment to 4 weeks from now, thanks   Truitt Merle MD

## 2018-05-04 NOTE — Telephone Encounter (Signed)
Oral Chemotherapy Pharmacist Encounter   I spoke with patient's niece, Anette, and medication coordinator at [facility] [place], for overview of: Promacta (eltrombopag) for the treatment of chronic idiopathic thrombocytopenia, planned duration until optimal platelet control is achieved or unacceptable toxicity.   Counseled on administration, dosing, side effects, monitoring, drug-food interactions, safe handling, storage, and disposal.  Patient will take Promacta 50mg  tablets, 1 tablet by mouth once daily on an empty stomach, 1 hour before or 2 hours after a meal.  Promacta will be administered at least 2 hours before, or 4 hours after antacids, foods high in calcium, or vitamin supplements (iron, calcium, aluminum, magnesium, selinium, zinc).  Promacta start date: week of 05/04/2018  Adverse effects include but are not limited to: fatigue, diarrhea, nausea, pruritis, decreased blood counts, hepatotoxicity, flu-like symptoms, myalgias, fever, and peripheral edema.    Reviewed importance of keeping a medication schedule and plan for any missed doses.  Anette and Kim voiced understanding and appreciation.   All questions answered. Medication reconciliation performed and medication/allergy list updated.  New prescription with updated directions to administer Promacta 50 mg tablets at 6 AM sent to the [facility] per request of SNF.  Medication acquisition and monitoring: [place] requests order for Promacta administration to be faxed over from Dr. Burr Medico. Dr. Burr Medico will also need to fax over order for labs to be monitored while on therapy with Promacta, and fax number that she would like those lab results to be faxed back to. Discussed above with collaborative practice RN, Valda Favia. [place] phone: [phone], fax: [phone]  Wynonia Lawman has been in contact with the [place] and provided credit card number for the pharmacy to recoup $100  co-pay. The first shipment of Promacta will ship from the pharmacy tomorrow (05/05/2018) for delivery to [facility] on Wednesday.  Administration order from Dr. Burr Medico should also include start date of Promacta.  Contact information for oral oncology clinic provided to patient's niece and to SNF. Oral Oncology Clinic will continue to follow.  Johny Drilling, PharmD, BCPS, BCOP  05/04/2018   1:28 PM Oral Oncology Clinic [phone]

## 2018-05-04 NOTE — Telephone Encounter (Signed)
Faxed order for Promacta and labs to [facility].

## 2018-05-05 ENCOUNTER — Telehealth: Payer: Self-pay | Admitting: Hematology

## 2018-05-05 NOTE — Telephone Encounter (Signed)
R/s appt per 10/14 sch message - niece is aware of appt date and time.

## 2018-05-05 NOTE — Telephone Encounter (Signed)
TC to [facility] to confirm orders received faxed on 05/04/18. Spoke with Candace Cruise RN stated they were not received fax number verified, she stated that was a non working fax # Orders re faxed to correct number [phone] confirmation received.

## 2018-05-05 NOTE — Telephone Encounter (Signed)
Oral Oncology Patient Advocate Encounter  Confirmed with [employer] that Promacta was shipped 05/05/18. Patient will receive it 05/06/18  [place] Patient [place] Phone [phone] Fax [phone]

## 2018-05-13 NOTE — Telephone Encounter (Signed)
Oral Oncology Patient Advocate Encounter  Patient received Promacta 05/06/18.  [place] Patient [place] Phone [phone] Fax [phone]

## 2018-05-14 ENCOUNTER — Ambulatory Visit: Payer: Medicare Other

## 2018-05-14 ENCOUNTER — Other Ambulatory Visit: Payer: Medicare Other

## 2018-05-14 ENCOUNTER — Inpatient Hospital Stay (HOSPITAL_COMMUNITY)
Admission: EM | Admit: 2018-05-14 | Discharge: 2018-05-26 | DRG: 562 | Disposition: A | Discharge status: Skilled Nursing Facility | Payer: Medicare Other | Source: Skilled Nursing Facility | Attending: Internal Medicine | Admitting: Internal Medicine

## 2018-05-14 ENCOUNTER — Emergency Department (HOSPITAL_COMMUNITY): Payer: Medicare Other

## 2018-05-14 ENCOUNTER — Encounter (HOSPITAL_COMMUNITY): Payer: Self-pay | Admitting: Emergency Medicine

## 2018-05-14 ENCOUNTER — Ambulatory Visit: Payer: Medicare Other | Admitting: Nurse Practitioner

## 2018-05-14 DIAGNOSIS — L89152 Pressure ulcer of sacral region, stage 2: Secondary | ICD-10-CM | POA: Diagnosis present

## 2018-05-14 DIAGNOSIS — D72823 Leukemoid reaction: Secondary | ICD-10-CM | POA: Diagnosis not present

## 2018-05-14 DIAGNOSIS — Z888 Allergy status to other drugs, medicaments and biological substances status: Secondary | ICD-10-CM | POA: Diagnosis not present

## 2018-05-14 DIAGNOSIS — F0390 Unspecified dementia without behavioral disturbance: Secondary | ICD-10-CM | POA: Diagnosis present

## 2018-05-14 DIAGNOSIS — E1122 Type 2 diabetes mellitus with diabetic chronic kidney disease: Secondary | ICD-10-CM | POA: Diagnosis present

## 2018-05-14 DIAGNOSIS — E1151 Type 2 diabetes mellitus with diabetic peripheral angiopathy without gangrene: Secondary | ICD-10-CM | POA: Diagnosis present

## 2018-05-14 DIAGNOSIS — N390 Urinary tract infection, site not specified: Secondary | ICD-10-CM | POA: Diagnosis present

## 2018-05-14 DIAGNOSIS — I252 Old myocardial infarction: Secondary | ICD-10-CM | POA: Diagnosis not present

## 2018-05-14 DIAGNOSIS — N183 Chronic kidney disease, stage 3 unspecified: Secondary | ICD-10-CM | POA: Diagnosis present

## 2018-05-14 DIAGNOSIS — Z833 Family history of diabetes mellitus: Secondary | ICD-10-CM

## 2018-05-14 DIAGNOSIS — D72829 Elevated white blood cell count, unspecified: Secondary | ICD-10-CM | POA: Diagnosis not present

## 2018-05-14 DIAGNOSIS — S42211A Unspecified displaced fracture of surgical neck of right humerus, initial encounter for closed fracture: Secondary | ICD-10-CM | POA: Diagnosis present

## 2018-05-14 DIAGNOSIS — I2510 Atherosclerotic heart disease of native coronary artery without angina pectoris: Secondary | ICD-10-CM | POA: Diagnosis present

## 2018-05-14 DIAGNOSIS — D696 Thrombocytopenia, unspecified: Secondary | ICD-10-CM | POA: Diagnosis not present

## 2018-05-14 DIAGNOSIS — E860 Dehydration: Secondary | ICD-10-CM | POA: Diagnosis present

## 2018-05-14 DIAGNOSIS — Z9841 Cataract extraction status, right eye: Secondary | ICD-10-CM

## 2018-05-14 DIAGNOSIS — W19XXXS Unspecified fall, sequela: Secondary | ICD-10-CM | POA: Diagnosis not present

## 2018-05-14 DIAGNOSIS — K219 Gastro-esophageal reflux disease without esophagitis: Secondary | ICD-10-CM | POA: Diagnosis present

## 2018-05-14 DIAGNOSIS — L8990 Pressure ulcer of unspecified site, unspecified stage: Secondary | ICD-10-CM

## 2018-05-14 DIAGNOSIS — Z9842 Cataract extraction status, left eye: Secondary | ICD-10-CM | POA: Diagnosis not present

## 2018-05-14 DIAGNOSIS — Z862 Personal history of diseases of the blood and blood-forming organs and certain disorders involving the immune mechanism: Secondary | ICD-10-CM

## 2018-05-14 DIAGNOSIS — B9620 Unspecified Escherichia coli [E. coli] as the cause of diseases classified elsewhere: Secondary | ICD-10-CM | POA: Diagnosis present

## 2018-05-14 DIAGNOSIS — Z8249 Family history of ischemic heart disease and other diseases of the circulatory system: Secondary | ICD-10-CM

## 2018-05-14 DIAGNOSIS — Y92129 Unspecified place in nursing home as the place of occurrence of the external cause: Secondary | ICD-10-CM | POA: Diagnosis not present

## 2018-05-14 DIAGNOSIS — W19XXXA Unspecified fall, initial encounter: Secondary | ICD-10-CM | POA: Diagnosis not present

## 2018-05-14 DIAGNOSIS — I5022 Chronic systolic (congestive) heart failure: Secondary | ICD-10-CM | POA: Diagnosis present

## 2018-05-14 DIAGNOSIS — E871 Hypo-osmolality and hyponatremia: Secondary | ICD-10-CM | POA: Diagnosis present

## 2018-05-14 DIAGNOSIS — S42201G Unspecified fracture of upper end of right humerus, subsequent encounter for fracture with delayed healing: Secondary | ICD-10-CM | POA: Diagnosis not present

## 2018-05-14 DIAGNOSIS — I259 Chronic ischemic heart disease, unspecified: Secondary | ICD-10-CM | POA: Diagnosis present

## 2018-05-14 DIAGNOSIS — Z951 Presence of aortocoronary bypass graft: Secondary | ICD-10-CM

## 2018-05-14 DIAGNOSIS — I080 Rheumatic disorders of both mitral and aortic valves: Secondary | ICD-10-CM | POA: Diagnosis present

## 2018-05-14 DIAGNOSIS — W19XXXD Unspecified fall, subsequent encounter: Secondary | ICD-10-CM | POA: Diagnosis not present

## 2018-05-14 DIAGNOSIS — Z7951 Long term (current) use of inhaled steroids: Secondary | ICD-10-CM

## 2018-05-14 DIAGNOSIS — I10 Essential (primary) hypertension: Secondary | ICD-10-CM | POA: Diagnosis present

## 2018-05-14 DIAGNOSIS — G9341 Metabolic encephalopathy: Secondary | ICD-10-CM | POA: Diagnosis present

## 2018-05-14 DIAGNOSIS — S42351A Displaced comminuted fracture of shaft of humerus, right arm, initial encounter for closed fracture: Secondary | ICD-10-CM | POA: Diagnosis not present

## 2018-05-14 DIAGNOSIS — I255 Ischemic cardiomyopathy: Secondary | ICD-10-CM | POA: Diagnosis present

## 2018-05-14 DIAGNOSIS — N3000 Acute cystitis without hematuria: Secondary | ICD-10-CM | POA: Diagnosis not present

## 2018-05-14 DIAGNOSIS — I130 Hypertensive heart and chronic kidney disease with heart failure and stage 1 through stage 4 chronic kidney disease, or unspecified chronic kidney disease: Secondary | ICD-10-CM | POA: Diagnosis present

## 2018-05-14 DIAGNOSIS — I25810 Atherosclerosis of coronary artery bypass graft(s) without angina pectoris: Secondary | ICD-10-CM | POA: Diagnosis not present

## 2018-05-14 DIAGNOSIS — C8590 Non-Hodgkin lymphoma, unspecified, unspecified site: Secondary | ICD-10-CM | POA: Diagnosis present

## 2018-05-14 DIAGNOSIS — R52 Pain, unspecified: Secondary | ICD-10-CM

## 2018-05-14 DIAGNOSIS — D631 Anemia in chronic kidney disease: Secondary | ICD-10-CM | POA: Diagnosis present

## 2018-05-14 DIAGNOSIS — E876 Hypokalemia: Secondary | ICD-10-CM | POA: Diagnosis not present

## 2018-05-14 DIAGNOSIS — S42351S Displaced comminuted fracture of shaft of humerus, right arm, sequela: Secondary | ICD-10-CM | POA: Diagnosis not present

## 2018-05-14 DIAGNOSIS — Z961 Presence of intraocular lens: Secondary | ICD-10-CM | POA: Diagnosis present

## 2018-05-14 LAB — CBC WITH DIFFERENTIAL/PLATELET
BASOS ABS: 0 10*3/uL (ref 0.0–0.1)
Basophils Relative: 0 %
Eosinophils Absolute: 0.3 10*3/uL (ref 0.0–0.5)
Eosinophils Relative: 1 %
HEMATOCRIT: 39.9 % (ref 36.0–46.0)
HEMOGLOBIN: 14.3 g/dL (ref 12.0–15.0)
LYMPHS ABS: 1 10*3/uL (ref 0.7–4.0)
LYMPHS PCT: 4 %
MCH: 33 pg (ref 26.0–34.0)
MCHC: 35.8 g/dL (ref 30.0–36.0)
MCV: 92.1 fL (ref 80.0–100.0)
MONOS PCT: 4 %
Monocytes Absolute: 1 10*3/uL (ref 0.1–1.0)
NEUTROS ABS: 23.7 10*3/uL — AB (ref 1.7–7.7)
NRBC: 0 % (ref 0.0–0.2)
Neutrophils Relative %: 91 %
Platelets: 198 10*3/uL (ref 150–400)
RBC: 4.33 MIL/uL (ref 3.87–5.11)
RDW: 12.6 % (ref 11.5–15.5)
WBC: 26 10*3/uL — ABNORMAL HIGH (ref 4.0–10.5)
nRBC: 0 /100 WBC

## 2018-05-14 LAB — BASIC METABOLIC PANEL
ANION GAP: 9 (ref 5–15)
BUN: 19 mg/dL (ref 8–23)
CO2: 24 mmol/L (ref 22–32)
Calcium: 9.1 mg/dL (ref 8.9–10.3)
Chloride: 87 mmol/L — ABNORMAL LOW (ref 98–111)
Creatinine, Ser: 1.25 mg/dL — ABNORMAL HIGH (ref 0.44–1.00)
GFR, EST AFRICAN AMERICAN: 43 mL/min — AB (ref >60)
GFR, EST NON AFRICAN AMERICAN: 37 mL/min — AB (ref >60)
GLUCOSE: 193 mg/dL — AB (ref 70–99)
POTASSIUM: 4.5 mmol/L (ref 3.5–5.1)
Sodium: 120 mmol/L — ABNORMAL LOW (ref 135–145)

## 2018-05-14 LAB — URINALYSIS, ROUTINE W REFLEX MICROSCOPIC
BILIRUBIN URINE: NEGATIVE
Glucose, UA: NEGATIVE mg/dL
KETONES UR: NEGATIVE mg/dL
NITRITE: NEGATIVE
PH: 6 (ref 5.0–8.0)
Protein, ur: 30 mg/dL — AB
Specific Gravity, Urine: 1.011 (ref 1.005–1.030)

## 2018-05-14 MED ORDER — ETOMIDATE 2 MG/ML IV SOLN
10.0000 mg | Freq: Once | INTRAVENOUS | Status: AC
  Administered 2018-05-15: 10 mg via INTRAVENOUS
  Filled 2018-05-14: qty 10

## 2018-05-14 MED ORDER — SODIUM CHLORIDE 0.9 % IV SOLN
1.0000 g | Freq: Once | INTRAVENOUS | Status: AC
  Administered 2018-05-14: 1 g via INTRAVENOUS
  Filled 2018-05-14: qty 10

## 2018-05-14 MED ORDER — MIDAZOLAM HCL 2 MG/2ML IJ SOLN
1.0000 mg | Freq: Once | INTRAMUSCULAR | Status: AC
  Administered 2018-05-15: 1 mg via INTRAVENOUS
  Filled 2018-05-14: qty 2

## 2018-05-14 NOTE — ED Notes (Signed)
ED Provider at bedside. 

## 2018-05-14 NOTE — H&P (Signed)
History and Physical   Lisa James:[id number] DOB: 12-31-1927 DOA: 05/14/2018  Referring MD/NP/PA: Dr. Sherry Ruffing  PCP: Lisa Seashore, MD   Patient coming from: [facility] skilled facility  Chief Complaint: Fall  HPI: Lisa James is a 84 y.o. female with medical history significant of dementia, coronary artery disease, hypertension, non-Hodgkin's lymphoma, hypertension who was recently moved into a skilled nursing facility about 2-1/2 weeks ago.  Patient has been unhappy with the move.  She was in there today when she had  unwitnessed fall.  Patient fell to her right side and sustained injury to her right upper extremity.  She was also noted to be more confused than usual.  Patient was brought to the ER where she was seen and evaluated and found to have UTI as well as right comminuted humeral shaft fracture.  She is complaining of some pain about 6 out of 10 in the right upper extremity.  She is not a good historian and most history is from family.  She has non-Hodgkin's lymphoma and has been on treatment with [facility] which was supposed to have been started today after previous injections.  Patient was previously at home but has declining health.  Family with the patient in the room.  Orthopedics has been consulted regarding her humeral fracture.  She is being admitted for work-up.  ED Course: Temperature is 97 for blood pressure 107/47 pulse 55 respiratory 21 oxygen sat 94% room air.  She has a white count of 26,000, hemoglobin is 14.3 with platelets 198.  Sodium is 120 with potassium 4.5 chloride 87 CO2 24 BUN 19 creatinine 1.25 calcium 9.1 glucose 193.  CT cervical spine, head CT without contrast are all negative.  CT and x-ray of the right shoulder showed Displaced transverse fracture through the humeral neck.  Orthopedics was consulted and patient is being admitted for treatment.  Review of Systems: As per HPI otherwise 10 point review of systems negative.    Past  Medical History:  Diagnosis Date  . Acute MI, lateral wall ([place])   . Aortic stenosis   . Chronic kidney disease   . Chronic systolic heart failure ([place])   . Coronary artery disease    sees Dr Claiborne Billings every 6 months  . Dementia ([place])   . Diabetes ([place])   . DM (diabetes mellitus) ([place])    type 2 ; diet controlled  . Heart disease   . HTN (hypertension)   . Immune thrombocytopenic purpura ([place]) 01/21/2013  . Ischemic heart disease   . Kidney disease   . Mitral regurgitation   . Non Hodgkin's lymphoma ([place])    of the throat  . Subdural hematoma, post-traumatic ([place]) 2015   sees Dr Sherwood Gambler every 6 months  . Thrombocytopenia ([place])    sees Dr Marin Olp every 2 weeks ;receives Nplate prn    Past Surgical History:  Procedure Laterality Date  . BONE MARROW BIOPSY  11/22/2002   left posterior iliac crest bone marrow biopsy and aspirate  . CATARACT EXTRACTION W/ INTRAOCULAR LENS  IMPLANT, BILATERAL Bilateral   . CORONARY ARTERY BYPASS GRAFT  11/20/2007   x5  . EYE SURGERY    . submental lymph node excisional biopsy  10/22/2002     reports that she has never smoked. She has never used smokeless tobacco. She reports that she does not drink alcohol or use drugs.  Allergies  Allergen Reactions  . Ace Inhibitors Cough  . Namzaric [Memantine Hcl-Donepezil Hcl]     Increased  memory loss, agitation    Family History  Problem Relation Age of Onset  . Heart attack Father   . Diabetes Father      Prior to Admission medications   Medication Sig Start Date End Date Taking? Authorizing Provider  atorvastatin (LIPITOR) 20 MG tablet TAKE 1 TABLET (20 MG TOTAL) BY MOUTH DAILY. Patient taking differently: Take 20 mg by mouth every evening.  01/29/16  Yes Darlyne Russian, MD  Calcium Carbonate-Vitamin D (CALCIUM-VITAMIN D) 500-200 MG-UNIT per tablet Take 1 tablet by mouth daily.    Yes [provider]  carvedilol (COREG) 6.25 MG tablet Take 1 tablet (6.25 mg total) by mouth 2 (two) times daily  with a meal. 07/28/15  Yes Daub, Loura Back, MD  Cholecalciferol (VITAMIN D) 1000 UNITS capsule Take 1,000 Units by mouth at bedtime.    Yes [provider]  eltrombopag (PROMACTA) 50 MG tablet Take 1 tablet (50 mg total) by mouth daily at 6 (six) AM. Take on an empty stomach 1 hr before or 2 hrs after food, separate from vitamins 05/04/18  Yes Truitt Merle, MD  irbesartan (AVAPRO) 300 MG tablet Take 1 tablet (300 mg total) by mouth daily. 03/13/15  Yes Troy Sine, MD  KLOR-CON M20 20 MEQ tablet TAKE 1 TABLET EVERY DAY Patient taking differently: Take 20 mEq by mouth every other day.  09/07/15  Yes Darlyne Russian, MD  Memantine HCl-Donepezil HCl (NAMZARIC) 28-10 MG CP24 Take 1 capsule by mouth every morning.   Yes [provider]  Multiple Vitamins-Minerals (PRESERVISION AREDS PO) Take 1 tablet by mouth daily.   Yes [provider]  sertraline (ZOLOFT) 25 MG tablet TAKE 1 TABLET BY MOUTH EVERY DAY Patient taking differently: Take 25 mg by mouth daily.  06/29/16  Yes Jeffery, Domingo Mend, PA  spironolactone (ALDACTONE) 25 MG tablet Take 25 mg by mouth daily.   Yes [provider]  traZODone (DESYREL) 50 MG tablet Take 50-100 mg by mouth at bedtime as needed for sleep.   Yes [provider]    Physical Exam: Vitals:   05/14/18 2200 05/14/18 2230 05/14/18 2239 05/14/18 2300  BP: (!) 109/47   (!) 107/47  Pulse: (!) 51 (!) 51 (!) 54 (!) 49  Resp: '13 16 17 14  ' Temp:      TempSrc:      SpO2: 95% 96% 98% 99%  Weight:      Height:          Constitutional: NAD, calm, comfortable Vitals:   05/14/18 2200 05/14/18 2230 05/14/18 2239 05/14/18 2300  BP: (!) 109/47   (!) 107/47  Pulse: (!) 51 (!) 51 (!) 54 (!) 49  Resp: '13 16 17 14  ' Temp:      TempSrc:      SpO2: 95% 96% 98% 99%  Weight:      Height:       Eyes: PERRL, lids and conjunctivae normal ENMT: Mucous membranes are moist. Posterior pharynx clear of any exudate or lesions.Normal dentition.  Neck:  normal, supple, no masses, no thyromegaly Respiratory: clear to auscultation bilaterally, no wheezing, no crackles. Normal respiratory effort. No accessory muscle use.  Cardiovascular: Regular rate and rhythm, no murmurs / rubs / gallops. No extremity edema. 2+ pedal pulses. No carotid bruits.  Abdomen: no tenderness, no masses palpated. No hepatosplenomegaly. Bowel sounds positive.  Musculoskeletal: Ecchymosis, disfiguration and joint deformity right upper extremity. Good ROM, no contractures. Normal muscle tone.  Skin: Multiple bruises in the upper  extremities and trunk no rashes, lesions, ulcers. No induration Neurologic: CN 2-12 grossly intact. Sensation intact, DTR normal. Strength 5/5 in all 4.  Psychiatric: Normal judgment and insight. Alert and oriented x 3. Normal mood.     Labs on Admission: I have personally reviewed following labs and imaging studies  CBC: Recent Labs  Lab 05/14/18 1929  WBC 26.0*  NEUTROABS 23.7*  HGB 14.3  HCT 39.9  MCV 92.1  PLT 704   Basic Metabolic Panel: Recent Labs  Lab 05/14/18 1929  NA 120*  K 4.5  CL 87*  CO2 24  GLUCOSE 193*  BUN 19  CREATININE 1.25*  CALCIUM 9.1   GFR: Estimated Creatinine Clearance: 22.3 mL/min (A) (by C-G formula based on SCr of 1.25 mg/dL (H)). Liver Function Tests: No results for input(s): AST, ALT, ALKPHOS, BILITOT, PROT, ALBUMIN in the last 168 hours. No results for input(s): LIPASE, AMYLASE in the last 168 hours. No results for input(s): AMMONIA in the last 168 hours. Coagulation Profile: No results for input(s): INR, PROTIME in the last 168 hours. Cardiac Enzymes: No results for input(s): CKTOTAL, CKMB, CKMBINDEX, TROPONINI in the last 168 hours. BNP (last 3 results) No results for input(s): PROBNP in the last 8760 hours. HbA1C: No results for input(s): HGBA1C in the last 72 hours. CBG: No results for input(s): GLUCAP in the last 168 hours. Lipid Profile: No results for input(s): CHOL, HDL,  LDLCALC, TRIG, CHOLHDL, LDLDIRECT in the last 72 hours. Thyroid Function Tests: No results for input(s): TSH, T4TOTAL, FREET4, T3FREE, THYROIDAB in the last 72 hours. Anemia Panel: No results for input(s): VITAMINB12, FOLATE, FERRITIN, TIBC, IRON, RETICCTPCT in the last 72 hours. Urine analysis:    Component Value Date/Time   COLORURINE YELLOW 05/14/2018 1910   APPEARANCEUR HAZY (A) 05/14/2018 1910   APPEARANCEUR Hazy 11/19/2012 1750   LABSPEC 1.011 05/14/2018 1910   LABSPEC 1.014 11/19/2012 1750   PHURINE 6.0 05/14/2018 1910   GLUCOSEU NEGATIVE 05/14/2018 1910   GLUCOSEU Negative 11/19/2012 1750   HGBUR MODERATE (A) 05/14/2018 1910   BILIRUBINUR NEGATIVE 05/14/2018 1910   BILIRUBINUR negative 05/12/2015 1823   BILIRUBINUR Negative 11/19/2012 1750   KETONESUR NEGATIVE 05/14/2018 1910   PROTEINUR 30 (A) 05/14/2018 1910   UROBILINOGEN 0.2 05/13/2015 0504   NITRITE NEGATIVE 05/14/2018 1910   LEUKOCYTESUR LARGE (A) 05/14/2018 1910   LEUKOCYTESUR 2+ 11/19/2012 1750   Sepsis Labs: '@LABRCNTIP' (procalcitonin:4,lacticidven:4) )No results found for this or any previous visit (from the past 240 hour(s)).   Radiological Exams on Admission: Dg Chest 2 View  Result Date: 05/14/2018 CLINICAL DATA:  Fall, shoulder pain EXAM: CHEST - 2 VIEW COMPARISON:  05/12/2015 FINDINGS: Prior CABG. Mild cardiomegaly. Lungs clear. No effusions or pneumothorax. Severely displaced right humeral neck fracture noted as seen on today's right shoulder series. IMPRESSION: No acute cardiopulmonary disease. Displaced right humeral neck fracture. Electronically Signed   By: Rolm Baptise M.D.   On: 05/14/2018 20:22   Dg Shoulder Right  Result Date: 05/14/2018 CLINICAL DATA:  Fall, shoulder pain EXAM: RIGHT SHOULDER - 2+ VIEW COMPARISON:  None. FINDINGS: There Is a right humeral neck fracture. The humeral shaft is displaced medially relative to the humeral head which appears located in the glenohumeral fossa.  Degenerative changes in the right AC and glenohumeral joints. IMPRESSION: Severely displaced right humeral neck fracture. Electronically Signed   By: Rolm Baptise M.D.   On: 05/14/2018 20:21   Ct Head Wo Contrast  Result Date: 05/14/2018 CLINICAL DATA:  84 year old female with  fall, head and neck injury. EXAM: CT HEAD WITHOUT CONTRAST CT CERVICAL SPINE WITHOUT CONTRAST TECHNIQUE: Multidetector CT imaging of the head and cervical spine was performed following the standard protocol without intravenous contrast. Multiplanar CT image reconstructions of the cervical spine were also generated. COMPARISON:  08/22/2015 CT and prior studies FINDINGS: CT HEAD FINDINGS Brain: No acute infarction, hemorrhage, hydrocephalus or significant change noted from the prior study. A large chronic calcified LEFT frontoparietal subdural collection is again identified measuring up to 2.6 cm in maximal diameter causing 6 mm LEFT to RIGHT midline shift-unchanged. Atrophy and chronic small-vessel white matter ischemic changes are again noted. Vascular: Atherosclerotic calcifications again noted. Skull: Normal. Negative for fracture or focal lesion. Sinuses/Orbits: No acute finding. Other: None. CT CERVICAL SPINE FINDINGS Alignment: Normal. Skull base and vertebrae: No acute fracture. No primary bone lesion or focal pathologic process. Soft tissues and spinal canal: No prevertebral fluid or swelling. No visible canal hematoma. Disc levels: Moderate multilevel degenerative disc disease and spondylosis again noted. Mild multilevel facet arthropathy again identified. Upper chest: No acute abnormality Other: None IMPRESSION: 1. No evidence of acute intracranial abnormality. 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 2. No static evidence of acute injury to the cervical spine. Moderate multilevel degenerative changes 3. Cerebral atrophy and chronic small-vessel  white matter ischemic changes. Electronically Signed   By: Margarette Canada M.D.   On: 05/14/2018 20:03   Ct Cervical Spine Wo Contrast  Result Date: 05/14/2018 CLINICAL DATA:  84 year old female with fall, head and neck injury. EXAM: CT HEAD WITHOUT CONTRAST CT CERVICAL SPINE WITHOUT CONTRAST TECHNIQUE: Multidetector CT imaging of the head and cervical spine was performed following the standard protocol without intravenous contrast. Multiplanar CT image reconstructions of the cervical spine were also generated. COMPARISON:  08/22/2015 CT and prior studies FINDINGS: CT HEAD FINDINGS Brain: No acute infarction, hemorrhage, hydrocephalus or significant change noted from the prior study. A large chronic calcified LEFT frontoparietal subdural collection is again identified measuring up to 2.6 cm in maximal diameter causing 6 mm LEFT to RIGHT midline shift-unchanged. Atrophy and chronic small-vessel white matter ischemic changes are again noted. Vascular: Atherosclerotic calcifications again noted. Skull: Normal. Negative for fracture or focal lesion. Sinuses/Orbits: No acute finding. Other: None. CT CERVICAL SPINE FINDINGS Alignment: Normal. Skull base and vertebrae: No acute fracture. No primary bone lesion or focal pathologic process. Soft tissues and spinal canal: No prevertebral fluid or swelling. No visible canal hematoma. Disc levels: Moderate multilevel degenerative disc disease and spondylosis again noted. Mild multilevel facet arthropathy again identified. Upper chest: No acute abnormality Other: None IMPRESSION: 1. No evidence of acute intracranial abnormality. 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 2. No static evidence of acute injury to the cervical spine. Moderate multilevel degenerative changes 3. Cerebral atrophy and chronic small-vessel white matter ischemic changes. Electronically Signed   By: Margarette Canada M.D.   On:  05/14/2018 20:03   Ct Shoulder Right Wo Contrast  Result Date: 05/14/2018 CLINICAL DATA:  [place].  Right shoulder fracture. EXAM: CT OF THE UPPER RIGHT EXTREMITY WITHOUT CONTRAST TECHNIQUE: Multidetector CT imaging of the upper right extremity was performed according to the standard protocol. COMPARISON:  Radiographs 05/14/2018 FINDINGS: There is a transverse fracture through the humeral neck with medial displacement of the humeral shaft. The humeral shaft is displaced approximately 3 cm and is adjacent to the humeral  head and lower aspect of the glenoid. The humeral head is articulating with the glenoid. No vertical fracture through the greater tuberosity. No glenoid fracture. [facility] joint is intact. The visualized right ribs are intact and the visualized right lung is clear. Atherosclerotic calcifications involving the ascending aorta and surgical changes from bypass surgery. IMPRESSION: 1. Displaced transverse fracture through the humeral neck as detailed above. Please see 3D images. 2. The other bony structures are intact. Electronically Signed   By: Marijo Sanes M.D.   On: 05/14/2018 21:57     Assessment/Plan Principal Problem:   Fall Active Problems:   HYPERTENSION, BENIGN   ISCHEMIC HEART DISEASE   Non-Hodgkin's lymphoma ([place])   CAD (coronary artery disease)   Chronic kidney disease (CKD), stage III (moderate) ([facility])   GERD (gastroesophageal reflux disease)   Closed comminuted right humeral fracture   UTI (urinary tract infection)   Hyponatremia   Leucocytosis     #1 status post Fall: Suspected mechanical in nature but could be syncopal episode with her known cardiac disease.  Patient has a UTI which could be also responsible.  She is visibly dehydrated with sodium also 120.  She will be admitted and physical therapy will evaluate patient.  Gentle hydration due to low sodium appears volume contracted.  Avoid diuretics.  Treat UTI.  #2 UTI: Urine culture and sensitivities ordered.   Empirically start Rocephin.  #3 right comminuted humeral fracture: Orthopedics consulted.  At this point no surgical intervention expected.  Continue according to orthopedics.  #4 coronary artery disease: Stable.  No evidence of coronary artery decompensation.  Continue monitoring.  #5 hyponatremia: Most likely due to volume contraction.  Gentle hydration with saline but avoid fluid overload.  Free water restriction.  #6 relative bradycardia: Patient on beta-blocker.  She may have had bradycardia significantly leading to the fall.  We will back up on Coreg if heart rate remains low.  #7 leukocytosis: Most likely due to UTI.  Continue to monitor white count.  #8 dementia: Continue home regimen.  Appears stable  #9 non-Hodgkin's lymphoma: Continue Promacta   DVT prophylaxis: Heparin Code Status: Full code Family Communication: [place] and sister in the room including power of attorney Disposition Plan: Back to skilled nursing facility Consults called: Orthopedic surgery Dr. Maxie Better Admission status: Inpatient  Severity of Illness: The appropriate patient status for this patient is INPATIENT. Inpatient status is judged to be reasonable and necessary in order to provide the required intensity of service to ensure the patient's safety. The patient's presenting symptoms, physical exam findings, and initial radiographic and laboratory data in the context of their chronic comorbidities is felt to place them at high risk for further clinical deterioration. Furthermore, it is not anticipated that the patient will be medically stable for discharge from the hospital within 2 midnights of admission. The following factors support the patient status of inpatient.   " The patient's presenting symptoms include fall. " The worrisome physical exam findings include comminuted right upper extremity fracture. " The initial radiographic and laboratory data are worrisome because of x-ray showing fracture of the  right humeral shaft. " The chronic co-morbidities include dementia with ischemic cardiomyopathy.   * I certify that at the point of admission it is my clinical judgment that the patient will require inpatient hospital care spanning beyond 2 midnights from the point of admission due to high intensity of service, high risk for further deterioration and high frequency of surveillance required.Barbette Merino MD [facility] Pager [phone]  If 7PM-7AM, please contact night-coverage [email or web address] Password [email or web address]  05/14/2018, 11:15 PM

## 2018-05-14 NOTE — ED Notes (Signed)
Patient transported to CT 

## 2018-05-14 NOTE — ED Provider Notes (Signed)
[facility] EMERGENCY DEPARTMENT Provider Note   CSN: [id number] Arrival date & time: 05/14/18  1856     History   Chief Complaint Chief Complaint  Patient presents with  . Fall    HPI Lisa James is a 84 y.o. female.  The history is provided by the patient, the nursing home and medical records. No language interpreter was used.  Fall   Lisa James is a 84 y.o. female  with a PMH as listed below who presents to the Emergency Department for evaluation after unwitnessed fall at facility. Patient unable to provide any history about events leading from fall. Not on anticoagulation. Only complaint is right shoulder pain.   Level V caveat applies 2/2 dementia   Past Medical History:  Diagnosis Date  . Acute MI, lateral wall ([place])   . Aortic stenosis   . Chronic kidney disease   . Chronic systolic heart failure ([place])   . Coronary artery disease    sees Dr Claiborne Billings every 6 months  . Dementia ([place])   . Diabetes ([place])   . DM (diabetes mellitus) ([place])    type 2 ; diet controlled  . Heart disease   . HTN (hypertension)   . 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 Ischemic heart disease   . Kidney disease   . Mitral regurgitation   . Non Hodgkin's lymphoma ([place])    of the throat  . Subdural hematoma, post-traumatic ([place]) 2015   sees Dr Sherwood Gambler every 6 months  . Thrombocytopenia ([place])    sees Dr Marin Olp every 2 weeks ;receives Nplate prn    Patient Active Problem List   Diagnosis Date Noted  . Fall 05/14/2018  . Closed comminuted right humeral fracture 05/14/2018  . UTI (urinary tract infection) 05/14/2018  . Hyponatremia 05/14/2018  . Leucocytosis 05/14/2018  . Altered mental status 05/13/2015  . Idiopathic thrombocytopenic purpura ([place]) 07/06/2013  . Aortic valve stenosis 06/19/2013  . 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 GERD (gastroesophageal reflux disease) 12/17/2012  . Type II or unspecified type diabetes  mellitus with renal manifestations, not stated as uncontrolled(250.40) 12/10/2012  . Chronic kidney disease (CKD), stage III (moderate) ([place]) 11/22/2012  . Diabetes mellitus ([place]) 07/03/2011  . Depression 07/03/2011  . Non-Hodgkin's lymphoma ([place]) 07/03/2011  . CAD (coronary artery disease) 07/03/2011  . PAD (peripheral artery disease) ([place]) 07/03/2011  . Vitamin D deficiency 07/03/2011  . Subdural hematoma ([place]) 07/03/2011  . Confusion 05/12/2011  . Subdural hematoma, acute ([place]) 05/12/2011  . HYPERLIPIDEMIA TYPE IIB / III 05/10/2009  . OLD MYOCARDIAL INFARCTION 05/10/2009  . Occlusion and stenosis of carotid artery without mention of cerebral infarction 05/10/2009  . HYPERTENSION, BENIGN 11/03/2008  . MITRAL REGURGITATION 10/29/2008  . ISCHEMIC HEART DISEASE 10/29/2008  . SYSTOLIC HEART FAILURE, CHRONIC 10/29/2008    Past Surgical History:  Procedure Laterality Date  . BONE MARROW BIOPSY  11/22/2002   left posterior iliac crest bone marrow biopsy and aspirate  . CATARACT EXTRACTION W/ INTRAOCULAR LENS  IMPLANT, BILATERAL Bilateral   . CORONARY ARTERY BYPASS GRAFT  11/20/2007   x5  . EYE SURGERY    . submental lymph node excisional biopsy  10/22/2002     OB History   None      Home Medications    Prior to Admission medications   Medication Sig Start Date End Date Taking? Authorizing Provider  atorvastatin (LIPITOR) 20 MG tablet TAKE 1 TABLET (20 MG TOTAL) BY MOUTH DAILY. 01/29/16  Darlyne Russian, MD  Calcium Carbonate-Vitamin D (CALCIUM-VITAMIN D) 500-200 MG-UNIT per tablet Take 1 tablet by mouth daily.     [provider]  carvedilol (COREG) 6.25 MG tablet Take 1 tablet (6.25 mg total) by mouth 2 (two) times daily with a meal. 07/28/15   Daub, Loura Back, MD  Cholecalciferol (VITAMIN D) 1000 UNITS capsule Take 1,000 Units by mouth at bedtime.     [provider]  eltrombopag (PROMACTA) 50 MG tablet Take 1 tablet (50 mg total) by mouth daily at 6 (six) AM. Take  on an empty stomach 1 hr before or 2 hrs after food, separate from vitamins 05/04/18   Truitt Merle, MD  fluticasone [facility]) 50 MCG/ACT nasal spray Place 2 sprays into both nostrils daily as needed.  04/12/16   [provider]  irbesartan (AVAPRO) 300 MG tablet Take 1 tablet (300 mg total) by mouth daily. 03/13/15   Troy Sine, MD  KLOR-CON M20 20 MEQ tablet TAKE 1 TABLET EVERY DAY Patient taking differently: TAKE 1 TABLET EVERY OTHER DAY 09/07/15   Darlyne Russian, MD  Multiple Vitamins-Minerals (PRESERVISION AREDS PO) Take 1 tablet by mouth daily.    [provider]  romiPLOStim (NPLATE) 250 MCG injection Inject 1 mcg/kg into the skin as needed (every 2-3 weeks for low platelets). Given @ Oncology    [provider]  sertraline (ZOLOFT) 25 MG tablet TAKE 1 TABLET BY MOUTH EVERY DAY 06/29/16   Harrison Mons, PA  spironolactone (ALDACTONE) 25 MG tablet Take 25 mg by mouth daily.    [provider]    Family History Family History  Problem Relation Age of Onset  . Heart attack Father   . Diabetes Father     Social History Social History   Tobacco Use  . Smoking status: Never Smoker  . Smokeless tobacco: Never Used  . Tobacco comment: never used tobacco.  Substance Use Topics  . Alcohol use: No    Alcohol/week: 0.0 standard drinks  . Drug use: No     Allergies   Ace inhibitors and Namzaric [memantine hcl-donepezil hcl]   Review of Systems Review of Systems  Unable to perform ROS: Dementia  Musculoskeletal: Positive for arthralgias.     Physical Exam Updated Vital Signs BP (!) 109/47   Pulse (!) 54   Temp (!) 97.4 F (36.3 C) (Oral)   Resp 17   Ht 5' (1.524 m)   Wt 48.3 kg   LMP  (LMP Unknown)   SpO2 98%   BMI 20.78 kg/m   Physical Exam  Constitutional: She appears well-developed and well-nourished. No distress.  HENT:  Head: Normocephalic and atraumatic.  Cardiovascular: Normal rate, regular rhythm and normal heart  sounds.  No murmur heard. Pulmonary/Chest: Effort normal and breath sounds normal. No respiratory distress. She has no wheezes. She has no rales. She exhibits no tenderness.  Abdominal: Soft. She exhibits no distension. There is no tenderness.  Musculoskeletal:  Tenderness to right shoulder with notable deformity. Decreased ROM of right shoulder. 5/5 muscle strength to LE's and LUE. Good grip strength. 2+ distal pulses x4. Sensation intact x 4.   Neurological: She is alert.  CN 2-12 grossly intact.   Skin: Skin is warm and dry.  Nursing note and vitals reviewed.    ED Treatments / Results  Labs (all labs ordered are listed, but only abnormal results are displayed) Labs Reviewed  CBC WITH DIFFERENTIAL/PLATELET - Abnormal; Notable for the following components:  Result Value   WBC 26.0 (*)    Neutro Abs 23.7 (*)    All other components within normal limits  BASIC METABOLIC PANEL - Abnormal; Notable for the following components:   Sodium 120 (*)    Chloride 87 (*)    Glucose, Bld 193 (*)    Creatinine, Ser 1.25 (*)    GFR calc non Af Amer 37 (*)    GFR calc Af Amer 43 (*)    All other components within normal limits  URINALYSIS, ROUTINE W REFLEX MICROSCOPIC - Abnormal; Notable for the following components:   APPearance HAZY (*)    Hgb urine dipstick MODERATE (*)    Protein, ur 30 (*)    Leukocytes, UA LARGE (*)    WBC, UA >50 (*)    Bacteria, UA RARE (*)    All other components within normal limits  URINE CULTURE    EKG None  Radiology Dg Chest 2 View  Result Date: 05/14/2018 CLINICAL DATA:  Fall, shoulder pain EXAM: CHEST - 2 VIEW COMPARISON:  05/12/2015 FINDINGS: Prior CABG. Mild cardiomegaly. Lungs clear. No effusions or pneumothorax. Severely displaced right humeral neck fracture noted as seen on today's right shoulder series. IMPRESSION: No acute cardiopulmonary disease. Displaced right humeral neck fracture. Electronically Signed   By: Rolm Baptise M.D.   On:  05/14/2018 20:22   Dg Shoulder Right  Result Date: 05/14/2018 CLINICAL DATA:  Fall, shoulder pain EXAM: RIGHT SHOULDER - 2+ VIEW COMPARISON:  None. FINDINGS: There Is a right humeral neck fracture. The humeral shaft is displaced medially relative to the humeral head which appears located in the glenohumeral fossa. Degenerative changes in the right AC and glenohumeral joints. IMPRESSION: Severely displaced right humeral neck fracture. Electronically Signed   By: Rolm Baptise M.D.   On: 05/14/2018 20:21   Ct Head Wo Contrast  Result Date: 05/14/2018 CLINICAL DATA:  84 year old female with fall, head and neck injury. EXAM: CT HEAD WITHOUT CONTRAST CT CERVICAL SPINE WITHOUT CONTRAST TECHNIQUE: Multidetector CT imaging of the head and cervical spine was performed following the standard protocol without intravenous contrast. Multiplanar CT image reconstructions of the cervical spine were also generated. COMPARISON:  08/22/2015 CT and prior studies FINDINGS: CT HEAD FINDINGS Brain: No acute infarction, hemorrhage, hydrocephalus or significant change noted from the prior study. A large chronic calcified LEFT frontoparietal subdural collection is again identified measuring up to 2.6 cm in maximal diameter causing 6 mm LEFT to RIGHT midline shift-unchanged. Atrophy and chronic small-vessel white matter ischemic changes are again noted. Vascular: Atherosclerotic calcifications again noted. Skull: Normal. Negative for fracture or focal lesion. Sinuses/Orbits: No acute finding. Other: None. CT CERVICAL SPINE FINDINGS Alignment: Normal. Skull base and vertebrae: No acute fracture. No primary bone lesion or focal pathologic process. Soft tissues and spinal canal: No prevertebral fluid or swelling. No visible canal hematoma. Disc levels: Moderate multilevel degenerative disc disease and spondylosis again noted. Mild multilevel facet arthropathy again identified. Upper chest: No acute abnormality Other: None IMPRESSION: 1.  No evidence of acute intracranial abnormality. 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 2. No static evidence of acute injury to the cervical spine. Moderate multilevel degenerative changes 3. Cerebral atrophy and chronic small-vessel white matter ischemic changes. Electronically Signed   By: Margarette Canada M.D.   On: 05/14/2018 20:03   Ct Cervical Spine Wo Contrast  Result Date: 05/14/2018 CLINICAL DATA:  84 year old female with fall, head and  neck injury. EXAM: CT HEAD WITHOUT CONTRAST CT CERVICAL SPINE WITHOUT CONTRAST TECHNIQUE: Multidetector CT imaging of the head and cervical spine was performed following the standard protocol without intravenous contrast. Multiplanar CT image reconstructions of the cervical spine were also generated. COMPARISON:  08/22/2015 CT and prior studies FINDINGS: CT HEAD FINDINGS Brain: No acute infarction, hemorrhage, hydrocephalus or significant change noted from the prior study. A large chronic calcified LEFT frontoparietal subdural collection is again identified measuring up to 2.6 cm in maximal diameter causing 6 mm LEFT to RIGHT midline shift-unchanged. Atrophy and chronic small-vessel white matter ischemic changes are again noted. Vascular: Atherosclerotic calcifications again noted. Skull: Normal. Negative for fracture or focal lesion. Sinuses/Orbits: No acute finding. Other: None. CT CERVICAL SPINE FINDINGS Alignment: Normal. Skull base and vertebrae: No acute fracture. No primary bone lesion or focal pathologic process. Soft tissues and spinal canal: No prevertebral fluid or swelling. No visible canal hematoma. Disc levels: Moderate multilevel degenerative disc disease and spondylosis again noted. Mild multilevel facet arthropathy again identified. Upper chest: No acute abnormality Other: None IMPRESSION: 1. No evidence of acute intracranial abnormality. Stable large chronic calcified LEFT  frontoparietal subdural collection causing 6 mm LEFT to RIGHT shift, unchanged from 2017. 2. No static evidence of acute injury to the cervical spine. Moderate multilevel degenerative changes 3. Cerebral atrophy and chronic small-vessel white matter ischemic changes. Electronically Signed   By: Margarette Canada M.D.   On: 05/14/2018 20:03   Ct Shoulder Right Wo Contrast  Result Date: 05/14/2018 CLINICAL DATA:  [place].  Right shoulder fracture. EXAM: CT OF THE UPPER RIGHT EXTREMITY WITHOUT CONTRAST TECHNIQUE: Multidetector CT imaging of the upper right extremity was performed according to the standard protocol. COMPARISON:  Radiographs 05/14/2018 FINDINGS: There is a transverse fracture through the humeral neck with medial displacement of the humeral shaft. The humeral shaft is displaced approximately 3 cm and is adjacent to the humeral head and lower aspect of the glenoid. The humeral head is articulating with the glenoid. No vertical fracture through the greater tuberosity. No glenoid fracture. The [facility] joint is intact. The visualized right ribs are intact and the visualized right lung is clear. Atherosclerotic calcifications involving the ascending aorta and surgical changes from bypass surgery. IMPRESSION: 1. Displaced transverse fracture through the humeral neck as detailed above. Please see 3D images. 2. The other bony structures are intact. Electronically Signed   By: Marijo Sanes M.D.   On: 05/14/2018 21:57    Procedures Procedures (including critical care time)  Medications Ordered in ED Medications  etomidate (AMIDATE) injection 10 mg (has no administration in time range)  midazolam (VERSED) injection 1 mg (has no administration in time range)  cefTRIAXone (ROCEPHIN) 1 g in sodium chloride 0.9 % 100 mL IVPB (1 g Intravenous New Bag/Given 05/14/18 2213)     Initial Impression / Assessment and Plan / ED Course  I have reviewed the triage vital signs and the nursing notes.  Pertinent labs &  imaging results that were available during my care of the patient were reviewed by me and considered in my medical decision making (see chart for details).    Lisa James is a 84 y.o. female who presents to ED for evaluation after unwitnessed fall. Notable deformity to right shoulder on exam. No abdominal tenderness. She is afebrile and hemodynamically stable. Labs reviewed and notable for sodium of 120. 3 weeks ago, sodium was normal at 136. She also has an elevated white count of 26. UA concerning for infection.  Started on rocephin. CT head and c-spine without acute abnormalities. Plain film of the shoulder shows severely displaced right humeral neck fracture. Consulted ortho, Dr. Marlou Sa, who would like to reduce in ED. He expects to arrive to perform sedation around 11 PM.  I consulted the hospitalist who will admit. At shift change, shoulder reduction still pending. Dr. Gilford Raid made aware and will assist in conscious sedation upon ortho arrival. Oncoming PA [place] aware of plan of care as well.    Final Clinical Impressions(s) / ED Diagnoses   Final diagnoses:  Fall  Hyponatremia  Lower urinary tract infection  Closed displaced fracture of surgical neck of right humerus, unspecified fracture morphology, initial encounter    ED Discharge Orders    None       Radley Teston, Ozella Almond, PA-C 05/14/18 2248    Tegeler, Gwenyth Allegra, MD 05/15/18 [phone]

## 2018-05-14 NOTE — ED Triage Notes (Signed)
Pt arrives via EMS from [facility] with reports of unwitnessed fall. Staff found pt laying on right side. Complaints of right shoulder pain. Denies LOC or blood thinners. Pt has hx of dementia and at baseline according to staff.

## 2018-05-14 NOTE — ED Notes (Signed)
Blood samples inadvertently marked in chart as collected at "0728" when it should be "1928".

## 2018-05-14 NOTE — ED Notes (Signed)
Patient transported to X-ray 

## 2018-05-15 ENCOUNTER — Inpatient Hospital Stay (HOSPITAL_COMMUNITY): Payer: Medicare Other

## 2018-05-15 DIAGNOSIS — S42211A Unspecified displaced fracture of surgical neck of right humerus, initial encounter for closed fracture: Principal | ICD-10-CM

## 2018-05-15 DIAGNOSIS — L8990 Pressure ulcer of unspecified site, unspecified stage: Secondary | ICD-10-CM

## 2018-05-15 DIAGNOSIS — W19XXXD Unspecified fall, subsequent encounter: Secondary | ICD-10-CM

## 2018-05-15 LAB — COMPREHENSIVE METABOLIC PANEL
ALBUMIN: 3.2 g/dL — AB (ref 3.5–5.0)
ALT: 22 U/L (ref 0–44)
AST: 29 U/L (ref 15–41)
Alkaline Phosphatase: 69 U/L (ref 38–126)
Anion gap: 9 (ref 5–15)
BILIRUBIN TOTAL: 1.3 mg/dL — AB (ref 0.3–1.2)
BUN: 17 mg/dL (ref 8–23)
CO2: 22 mmol/L (ref 22–32)
Calcium: 8.7 mg/dL — ABNORMAL LOW (ref 8.9–10.3)
Chloride: 92 mmol/L — ABNORMAL LOW (ref 98–111)
Creatinine, Ser: 1.26 mg/dL — ABNORMAL HIGH (ref 0.44–1.00)
GFR calc Af Amer: 43 mL/min — ABNORMAL LOW (ref >60)
GFR calc non Af Amer: 37 mL/min — ABNORMAL LOW (ref >60)
GLUCOSE: 108 mg/dL — AB (ref 70–99)
Potassium: 4.3 mmol/L (ref 3.5–5.1)
Sodium: 123 mmol/L — ABNORMAL LOW (ref 135–145)
TOTAL PROTEIN: 5.1 g/dL — AB (ref 6.5–8.1)

## 2018-05-15 LAB — CBC WITH DIFFERENTIAL/PLATELET
ABS IMMATURE GRANULOCYTES: 0.17 10*3/uL — AB (ref 0.00–0.07)
BASOS PCT: 0 %
Basophils Absolute: 0 10*3/uL (ref 0.0–0.1)
Eosinophils Absolute: 0 10*3/uL (ref 0.0–0.5)
Eosinophils Relative: 0 %
HCT: 35 % — ABNORMAL LOW (ref 36.0–46.0)
HEMOGLOBIN: 12.6 g/dL (ref 12.0–15.0)
IMMATURE GRANULOCYTES: 1 %
LYMPHS PCT: 10 %
Lymphs Abs: 1.8 10*3/uL (ref 0.7–4.0)
MCH: 33.4 pg (ref 26.0–34.0)
MCHC: 36 g/dL (ref 30.0–36.0)
MCV: 92.8 fL (ref 80.0–100.0)
MONO ABS: 1.9 10*3/uL — AB (ref 0.1–1.0)
MONOS PCT: 11 %
NEUTROS ABS: 14.3 10*3/uL — AB (ref 1.7–7.7)
Neutrophils Relative %: 78 %
PLATELETS: 189 10*3/uL (ref 150–400)
RBC: 3.77 MIL/uL — ABNORMAL LOW (ref 3.87–5.11)
RDW: 13 % (ref 11.5–15.5)
WBC: 18.3 10*3/uL — ABNORMAL HIGH (ref 4.0–10.5)
nRBC: 0 % (ref 0.0–0.2)

## 2018-05-15 LAB — MRSA PCR SCREENING: MRSA by PCR: NEGATIVE

## 2018-05-15 LAB — OSMOLALITY: Osmolality: 261 mOsm/kg — ABNORMAL LOW (ref 275–295)

## 2018-05-15 MED ORDER — ONDANSETRON HCL 4 MG PO TABS: 4.0000 mg | ORAL_TABLET | Freq: Four times a day (QID) | ORAL | Status: DC | PRN

## 2018-05-15 MED ORDER — TRAZODONE HCL 50 MG PO TABS
50.0000 mg | ORAL_TABLET | Freq: Every evening | ORAL | Status: DC | PRN
  Administered 2018-05-24: 50 mg via ORAL
  Filled 2018-05-15: qty 1

## 2018-05-15 MED ORDER — POTASSIUM CHLORIDE CRYS ER 20 MEQ PO TBCR: 20.0000 meq | EXTENDED_RELEASE_TABLET | ORAL | Status: DC

## 2018-05-15 MED ORDER — MEMANTINE HCL ER 28 MG PO CP24
28.0000 mg | ORAL_CAPSULE | Freq: Every day | ORAL | Status: DC
  Administered 2018-05-15 – 2018-05-18 (×4): 28 mg via ORAL
  Filled 2018-05-15 (×4): qty 1

## 2018-05-15 MED ORDER — IRBESARTAN 300 MG PO TABS
300.0000 mg | ORAL_TABLET | Freq: Every day | ORAL | Status: DC
  Administered 2018-05-15: 300 mg via ORAL
  Filled 2018-05-15: qty 1

## 2018-05-15 MED ORDER — SERTRALINE HCL 50 MG PO TABS
25.0000 mg | ORAL_TABLET | Freq: Every day | ORAL | Status: DC
  Administered 2018-05-15 – 2018-05-17 (×3): 25 mg via ORAL
  Filled 2018-05-15 (×3): qty 1

## 2018-05-15 MED ORDER — CARVEDILOL 6.25 MG PO TABS
6.2500 mg | ORAL_TABLET | Freq: Two times a day (BID) | ORAL | Status: DC
  Administered 2018-05-15 – 2018-05-18 (×7): 6.25 mg via ORAL
  Filled 2018-05-15 (×7): qty 1

## 2018-05-15 MED ORDER — CALCIUM CARBONATE-VITAMIN D 500-200 MG-UNIT PO TABS
1.0000 | ORAL_TABLET | Freq: Every day | ORAL | Status: DC
  Administered 2018-05-15 – 2018-05-26 (×12): 1 via ORAL
  Filled 2018-05-15 (×12): qty 1

## 2018-05-15 MED ORDER — PROSIGHT PO TABS
1.0000 | ORAL_TABLET | Freq: Every day | ORAL | Status: DC
  Administered 2018-05-15 – 2018-05-26 (×12): 1 via ORAL
  Filled 2018-05-15 (×12): qty 1

## 2018-05-15 MED ORDER — ATORVASTATIN CALCIUM 20 MG PO TABS
20.0000 mg | ORAL_TABLET | Freq: Every day | ORAL | Status: DC
  Administered 2018-05-15 – 2018-05-25 (×11): 20 mg via ORAL
  Filled 2018-05-15 (×11): qty 1

## 2018-05-15 MED ORDER — HYDRALAZINE HCL 20 MG/ML IJ SOLN
10.0000 mg | Freq: Four times a day (QID) | INTRAMUSCULAR | Status: DC | PRN
  Administered 2018-05-16 – 2018-05-18 (×2): 10 mg via INTRAVENOUS
  Filled 2018-05-15 (×2): qty 1

## 2018-05-15 MED ORDER — FLUTICASONE PROPIONATE 50 MCG/ACT NA SUSP: 2.0000 | Freq: Every day | NASAL | Status: DC | PRN

## 2018-05-15 MED ORDER — ONDANSETRON HCL 4 MG/2ML IJ SOLN: 4.0000 mg | Freq: Four times a day (QID) | INTRAMUSCULAR | Status: DC | PRN

## 2018-05-15 MED ORDER — SODIUM CHLORIDE 0.9 % IV SOLN
1.0000 g | INTRAVENOUS | Status: DC
  Administered 2018-05-15 – 2018-05-17 (×3): 1 g via INTRAVENOUS
  Filled 2018-05-15 (×3): qty 10

## 2018-05-15 MED ORDER — HEPARIN SODIUM (PORCINE) 5000 UNIT/ML IJ SOLN
5000.0000 [IU] | Freq: Three times a day (TID) | INTRAMUSCULAR | Status: DC
  Administered 2018-05-15 – 2018-05-16 (×4): 5000 [IU] via SUBCUTANEOUS
  Filled 2018-05-15 (×4): qty 1

## 2018-05-15 MED ORDER — MEMANTINE HCL-DONEPEZIL HCL ER 28-10 MG PO CP24: 1.0000 | ORAL_CAPSULE | ORAL | Status: DC

## 2018-05-15 MED ORDER — DONEPEZIL HCL 10 MG PO TABS
10.0000 mg | ORAL_TABLET | Freq: Every day | ORAL | Status: DC
  Administered 2018-05-15 – 2018-05-18 (×4): 10 mg via ORAL
  Filled 2018-05-15 (×4): qty 1

## 2018-05-15 MED ORDER — SODIUM CHLORIDE 0.9 % IV SOLN
INTRAVENOUS | Status: DC
  Administered 2018-05-15 – 2018-05-16 (×3): via INTRAVENOUS

## 2018-05-15 MED ORDER — ELTROMBOPAG OLAMINE 50 MG PO TABS
50.0000 mg | ORAL_TABLET | Freq: Every day | ORAL | Status: DC
  Filled 2018-05-15: qty 1

## 2018-05-15 MED ORDER — MORPHINE SULFATE (PF) 2 MG/ML IV SOLN
1.0000 mg | INTRAVENOUS | Status: DC | PRN
  Administered 2018-05-15: 1 mg via INTRAVENOUS
  Filled 2018-05-15: qty 1

## 2018-05-15 MED ORDER — SPIRONOLACTONE 25 MG PO TABS
25.0000 mg | ORAL_TABLET | Freq: Every day | ORAL | Status: DC
  Administered 2018-05-15: 25 mg via ORAL
  Filled 2018-05-15: qty 1

## 2018-05-15 MED ORDER — VITAMIN D 1000 UNITS PO TABS
1000.0000 [IU] | ORAL_TABLET | Freq: Every day | ORAL | Status: DC
  Administered 2018-05-15 – 2018-05-25 (×12): 1000 [IU] via ORAL
  Filled 2018-05-15 (×13): qty 1

## 2018-05-15 NOTE — Progress Notes (Signed)
[facility]                                                                              Patient Demographics  Lisa James, is a 84 y.o. female, DOB - June 10, 1928, OYD:[id number]  Admit date - 05/14/2018   Admitting Physician Elwyn Reach, MD  Outpatient Primary MD for the patient is Merrilee Seashore, MD  Outpatient specialists:   LOS - 1  days   Medical records reviewed and are as summarized below:    Chief Complaint  Patient presents with  . Fall       Brief summary   Patient is a 84 year old female with history of dementia, CAD, hypertension, ITP, recently moved into skilled nursing facility about 2-1/2 weeks ago.  Apparently patient had been unhappy with the move, on the day of admission, she had an unwitnessed fall, fell to her right side.  She was noted to be more confused than her usual.  In ED, patient was found to have UTI as well as right comminuted humeral shaft fracture.  Patient was admitted for further work-up.  She has history of ITP and has been on treatment with Promacta, supposed to have been started on the day of admission after previous injections.  Previously at home but has been declining in health.    Assessment & Plan    Principal Problem:   Fall with displaced transverse fracture through the right humeral neck -CT and x-ray of the right shoulder show displaced transverse fracture through the humeral neck, orthopedics was consulted -Patient underwent closed reduction under conscious sedation, postop day #1, management per orthopedic -PT OT evaluation, continue right shoulder immobilized  Active Problems: Acute metabolic encephalopathy in the setting of dementia -Likely worsened due to UTI, currently still somewhat confused oriented to herself and place -Follow urine cultures, continue IV Rocephin -Continue Namenda, Aricept -Fall precautions  UTI -Follow urine culture and sensitivities, continue IV Rocephin   HYPERTENSION, BENIGN -Currently stable, place on hydralazine IV as needed with parameters, continue Coreg.  Holding ACE inhibitor and Spironolactone due to hyponatremia today  History of ITP -Patient follows Dr. Burr Medico, last seen on 04/23/2018 - Patient currently on Promacta, to maintain platelet counts above 50 K - will continue and notify Dr. Burr Medico    CAD (coronary artery disease) -Currently stable, no chest pain or shortness of breath -Continue Coreg, and statin.    Chronic kidney disease (CKD), stage III (moderate) (HCC) -Creatinine currently at baseline 1.2     Hyponatremia -Sodium trending up, continue IV fluid hydration, obtain serum osmolarity, urine osmolarity, UNA -For now holding Avapro and spironolactone    Pressure injury of skin -Stage II, sacral, wound care per nursing  Code Status: Full CODE STATUS DVT Prophylaxis: Heparin subcu Family Communication: Discussed in detail with the patient, all imaging results, lab results explained to the patient    Disposition Plan: Await PT OT evaluation  Time Spent in minutes 25 minutes  Procedures:  Closed reduction of right humeral fracture  Consultants:   Orthopedics  Antimicrobials:      Medications  Scheduled Meds: . atorvastatin  20 mg Oral q1800  .  calcium-vitamin D  1 tablet Oral Q breakfast  . carvedilol  6.25 mg Oral BID WC  . cholecalciferol  1,000 Units Oral QHS  . memantine  28 mg Oral Daily   And  . donepezil  10 mg Oral Daily  . eltrombopag  50 mg Oral Q0600  . heparin  5,000 Units Subcutaneous Q8H  . irbesartan  300 mg Oral Daily  . multivitamin  1 tablet Oral Daily  . [START ON 05/16/2018] potassium chloride SA  20 mEq Oral QODAY  . sertraline  25 mg Oral Daily  . spironolactone  25 mg Oral Daily   Continuous Infusions: . sodium chloride 75 mL/hr at 05/15/18 0216  . cefTRIAXone (ROCEPHIN)  IV     PRN Meds:.fluticasone, morphine injection, ondansetron **OR** ondansetron (ZOFRAN) IV,  traZODone   Antibiotics   Anti-infectives (From admission, onward)   Start     Dose/Rate Route Frequency Ordered Stop   05/15/18 2200  cefTRIAXone (ROCEPHIN) 1 g in sodium chloride 0.9 % 100 mL IVPB     1 g 200 mL/hr over 30 Minutes Intravenous Every 24 hours 05/15/18 0156     05/14/18 2200  cefTRIAXone (ROCEPHIN) 1 g in sodium chloride 0.9 % 100 mL IVPB     1 g 200 mL/hr over 30 Minutes Intravenous  Once 05/14/18 2157 05/14/18 2302        Subjective:   Lisa James was seen and examined today.  Somewhat confused, oriented to self and place, in the hospital but otherwise confused. Patient denies dizziness, chest pain, shortness of breath, abdominal pain, nausea and vomiting.  No fevers Objective:   Vitals:   05/15/18 0107 05/15/18 0207 05/15/18 0524 05/15/18 0754  BP: (!) 136/51 (!) 114/47 119/80 (!) 129/56  Pulse: (!) 49 (!) 47 (!) 51 (!) 50  Resp: 13 18 18 16   Temp:  97.6 F (36.4 C) 97.8 F (36.6 C) 97.6 F (36.4 C)  TempSrc:  Oral  Oral  SpO2: 98% 97% 100% 99%  Weight:  45.3 kg    Height:  5\' 1"  (1.549 m)      Intake/Output Summary (Last 24 hours) at 05/15/2018 1015 Last data filed at 05/15/2018 0953 Gross per 24 hour  Intake 428.79 ml  Output -  Net 428.79 ml     Wt Readings from Last 3 Encounters:  05/15/18 45.3 kg  04/23/18 48.3 kg  01/29/18 48.4 kg     Exam  General: Alert, awake, oriented to self and place, in the hospital otherwise somewhat confused,  Eyes:   HEENT:  Atraumatic, normocephalic  Cardiovascular: S1 S2 auscultated, Regular rate and rhythm.  Respiratory: Clear to auscultation bilaterally, no wheezing, rales or rhonchi  Gastrointestinal: Soft, nontender, nondistended, + bowel sounds  Ext: no pedal edema bilaterally  Neuro: right shoulder in sling otherwise moving other 3 extremities  Musculoskeletal: No digital cyanosis, clubbing  Skin: No rashes  Psych: confused   Data Reviewed:  I have personally reviewed  following labs and imaging studies  Micro Results No results found for this or any previous visit (from the past 240 hour(s)).  Radiology Reports Dg Chest 2 View  Result Date: 05/14/2018 CLINICAL DATA:  Fall, shoulder pain EXAM: CHEST - 2 VIEW COMPARISON:  05/12/2015 FINDINGS: Prior CABG. Mild cardiomegaly. Lungs clear. No effusions or pneumothorax. Severely displaced right humeral neck fracture noted as seen on today's right shoulder series. IMPRESSION: No acute cardiopulmonary disease. Displaced right humeral neck fracture. Electronically Signed   By: Rolm Baptise M.D.  On: 05/14/2018 20:22   Dg Shoulder Right  Result Date: 05/14/2018 CLINICAL DATA:  Fall, shoulder pain EXAM: RIGHT SHOULDER - 2+ VIEW COMPARISON:  None. FINDINGS: There Is a right humeral neck fracture. The humeral shaft is displaced medially relative to the humeral head which appears located in the glenohumeral fossa. Degenerative changes in the right AC and glenohumeral joints. IMPRESSION: Severely displaced right humeral neck fracture. Electronically Signed   By: Rolm Baptise M.D.   On: 05/14/2018 20:21   Ct Head Wo Contrast  Result Date: 05/14/2018 CLINICAL DATA:  84 year old female with fall, head and neck injury. EXAM: CT HEAD WITHOUT CONTRAST CT CERVICAL SPINE WITHOUT CONTRAST TECHNIQUE: Multidetector CT imaging of the head and cervical spine was performed following the standard protocol without intravenous contrast. Multiplanar CT image reconstructions of the cervical spine were also generated. COMPARISON:  08/22/2015 CT and prior studies FINDINGS: CT HEAD FINDINGS Brain: No acute infarction, hemorrhage, hydrocephalus or significant change noted from the prior study. A large chronic calcified LEFT frontoparietal subdural collection is again identified measuring up to 2.6 cm in maximal diameter causing 6 mm LEFT to RIGHT midline shift-unchanged. Atrophy and chronic small-vessel white matter ischemic changes are again  noted. Vascular: Atherosclerotic calcifications again noted. Skull: Normal. Negative for fracture or focal lesion. Sinuses/Orbits: No acute finding. Other: None. CT CERVICAL SPINE FINDINGS Alignment: Normal. Skull base and vertebrae: No acute fracture. No primary bone lesion or focal pathologic process. Soft tissues and spinal canal: No prevertebral fluid or swelling. No visible canal hematoma. Disc levels: Moderate multilevel degenerative disc disease and spondylosis again noted. Mild multilevel facet arthropathy again identified. Upper chest: No acute abnormality Other: None IMPRESSION: 1. No evidence of acute intracranial abnormality. 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 2. No static evidence of acute injury to the cervical spine. Moderate multilevel degenerative changes 3. Cerebral atrophy and chronic small-vessel white matter ischemic changes. Electronically Signed   By: Margarette Canada M.D.   On: 05/14/2018 20:03   Ct Cervical Spine Wo Contrast  Result Date: 05/14/2018 CLINICAL DATA:  84 year old female with fall, head and neck injury. EXAM: CT HEAD WITHOUT CONTRAST CT CERVICAL SPINE WITHOUT CONTRAST TECHNIQUE: Multidetector CT imaging of the head and cervical spine was performed following the standard protocol without intravenous contrast. Multiplanar CT image reconstructions of the cervical spine were also generated. COMPARISON:  08/22/2015 CT and prior studies FINDINGS: CT HEAD FINDINGS Brain: No acute infarction, hemorrhage, hydrocephalus or significant change noted from the prior study. A large chronic calcified LEFT frontoparietal subdural collection is again identified measuring up to 2.6 cm in maximal diameter causing 6 mm LEFT to RIGHT midline shift-unchanged. Atrophy and chronic small-vessel white matter ischemic changes are again noted. Vascular: Atherosclerotic calcifications again noted. Skull: Normal.  Negative for fracture or focal lesion. Sinuses/Orbits: No acute finding. Other: None. CT CERVICAL SPINE FINDINGS Alignment: Normal. Skull base and vertebrae: No acute fracture. No primary bone lesion or focal pathologic process. Soft tissues and spinal canal: No prevertebral fluid or swelling. No visible canal hematoma. Disc levels: Moderate multilevel degenerative disc disease and spondylosis again noted. Mild multilevel facet arthropathy again identified. Upper chest: No acute abnormality Other: None IMPRESSION: 1. No evidence of acute intracranial abnormality. 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 2. No static evidence of acute injury to the cervical spine. Moderate multilevel degenerative changes 3. Cerebral atrophy and  chronic small-vessel white matter ischemic changes. Electronically Signed   By: Margarette Canada M.D.   On: 05/14/2018 20:03   Ct Shoulder Right Wo Contrast  Result Date: 05/14/2018 CLINICAL DATA:  [place].  Right shoulder fracture. EXAM: CT OF THE UPPER RIGHT EXTREMITY WITHOUT CONTRAST TECHNIQUE: Multidetector CT imaging of the upper right extremity was performed according to the standard protocol. COMPARISON:  Radiographs 05/14/2018 FINDINGS: There is a transverse fracture through the humeral neck with medial displacement of the humeral shaft. The humeral shaft is displaced approximately 3 cm and is adjacent to the humeral head and lower aspect of the glenoid. The humeral head is articulating with the glenoid. No vertical fracture through the greater tuberosity. No glenoid fracture. The [facility] joint is intact. The visualized right ribs are intact and the visualized right lung is clear. Atherosclerotic calcifications involving the ascending aorta and surgical changes from bypass surgery. IMPRESSION: 1. Displaced transverse fracture through the humeral neck as detailed above. Please see 3D images. 2. The other bony  structures are intact. Electronically Signed   By: Marijo Sanes M.D.   On: 05/14/2018 21:57   Dg Humerus Right  Result Date: 05/15/2018 CLINICAL DATA:  Postreduction EXAM: RIGHT HUMERUS - 2+ VIEW COMPARISON:  05/14/2018 FINDINGS: Interval reduction of the displaced humeral neck fracture with improved alignment on this single AP view. No subluxation or dislocation. IMPRESSION: Improved alignment postreduction on this single view. Electronically Signed   By: Rolm Baptise M.D.   On: 05/15/2018 00:58    Lab Data:  CBC: Recent Labs  Lab 05/14/18 1929 05/15/18 0705  WBC 26.0* 18.3*  NEUTROABS 23.7* 14.3*  HGB 14.3 12.6  HCT 39.9 35.0*  MCV 92.1 92.8  PLT 198 409   Basic Metabolic Panel: Recent Labs  Lab 05/14/18 1929 05/15/18 0705  NA 120* 123*  K 4.5 4.3  CL 87* 92*  CO2 24 22  GLUCOSE 193* 108*  BUN 19 17  CREATININE 1.25* 1.26*  CALCIUM 9.1 8.7*   GFR: Estimated Creatinine Clearance: 22.1 mL/min (A) (by C-G formula based on SCr of 1.26 mg/dL (H)). Liver Function Tests: Recent Labs  Lab 05/15/18 0705  AST 29  ALT 22  ALKPHOS 69  BILITOT 1.3*  PROT 5.1*  ALBUMIN 3.2*   No results for input(s): LIPASE, AMYLASE in the last 168 hours. No results for input(s): AMMONIA in the last 168 hours. Coagulation Profile: No results for input(s): INR, PROTIME in the last 168 hours. Cardiac Enzymes: No results for input(s): CKTOTAL, CKMB, CKMBINDEX, TROPONINI in the last 168 hours. BNP (last 3 results) No results for input(s): PROBNP in the last 8760 hours. HbA1C: No results for input(s): HGBA1C in the last 72 hours. CBG: No results for input(s): GLUCAP in the last 168 hours. Lipid Profile: No results for input(s): CHOL, HDL, LDLCALC, TRIG, CHOLHDL, LDLDIRECT in the last 72 hours. Thyroid Function Tests: No results for input(s): TSH, T4TOTAL, FREET4, T3FREE, THYROIDAB in the last 72 hours. Anemia Panel: No results for input(s): VITAMINB12, FOLATE, FERRITIN, TIBC, IRON,  RETICCTPCT in the last 72 hours. Urine analysis:    Component Value Date/Time   COLORURINE YELLOW 05/14/2018 1910   APPEARANCEUR HAZY (A) 05/14/2018 1910   APPEARANCEUR Hazy 11/19/2012 1750   LABSPEC 1.011 05/14/2018 1910   LABSPEC 1.014 11/19/2012 1750   PHURINE 6.0 05/14/2018 1910   GLUCOSEU NEGATIVE 05/14/2018 1910   GLUCOSEU Negative 11/19/2012 1750   HGBUR MODERATE (A) 05/14/2018 1910   BILIRUBINUR NEGATIVE 05/14/2018 1910   BILIRUBINUR negative  05/12/2015 1823   BILIRUBINUR Negative 11/19/2012 [place] 05/14/2018 1910   PROTEINUR 30 (A) 05/14/2018 1910   UROBILINOGEN 0.2 05/13/2015 0504   NITRITE NEGATIVE 05/14/2018 1910   LEUKOCYTESUR LARGE (A) 05/14/2018 1910   LEUKOCYTESUR 2+ 11/19/2012 1750     Ruth Tully M.D. [facility] 05/15/2018, 10:15 AM  Pager: [phone] Between 7am to 7pm - call Pager - [phone]  After 7pm go to [email or web address] - password [email or web address]  Call night coverage person covering after 7pm

## 2018-05-15 NOTE — Progress Notes (Signed)
Orthopedic Tech Progress Note Patient Details:  Lisa James [birth year] [id number]  Ortho Devices Type of Ortho Device: Sling immobilizer Ortho Device/Splint Location: rue. Applied post reduction with drs assistance. Ortho Device/Splint Interventions: Ordered, Application, Adjustment   Post Interventions Patient Tolerated: Well Instructions Provided: Care of device, Adjustment of device   Karolee Stamps 05/15/2018, 12:46 AM

## 2018-05-15 NOTE — ED Provider Notes (Signed)
.  Sedation Date/Time: 05/15/2018 12:42 AM Performed by: Carlisha Wisler, Delice Bison, DO Authorized by: Sadey Yandell, Delice Bison, DO   Consent:    Consent obtained:  Written   Consent given by: daughter.   Risks discussed:  Dysrhythmia, inadequate sedation, allergic reaction, nausea, vomiting, respiratory compromise necessitating ventilatory assistance and intubation, prolonged sedation necessitating reversal and prolonged hypoxia resulting in organ damage   Alternatives discussed:  Analgesia without sedation Indications:    Procedure performed:  Fracture reduction   Procedure necessitating sedation performed by:  Different physician Pre-sedation assessment:    Time since last food or drink:  530 pm   ASA classification: class 3 - patient with severe systemic disease     Neck mobility: reduced     Mouth opening:  2 finger widths   Mallampati score:  II - soft palate, uvula, fauces visible   Pre-sedation assessments completed and reviewed: airway patency, cardiovascular function, hydration status, mental status, nausea/vomiting, pain level, respiratory function and temperature     Pre-sedation assessment completed:  05/15/2018 12:10 AM Immediate pre-procedure details:    Reassessment: Patient reassessed immediately prior to procedure     Reviewed: vital signs, relevant labs/tests and NPO status     Verified: bag valve mask available, emergency equipment available, intubation equipment available, IV patency confirmed, oxygen available, reversal medications available and suction available   Procedure details (see MAR for exact dosages):    Preoxygenation:  Nasal cannula   Sedation:  Etomidate and midazolam   Intra-procedure monitoring:  Blood pressure monitoring, cardiac monitor, continuous capnometry, continuous pulse oximetry, frequent LOC assessments and frequent vital sign checks   Intra-procedure events: respiratory depression     Intra-procedure events comment:  Apnea   Intra-procedure management:  Airway  repositioning, BVM ventilation and supplemental oxygen   Total Provider sedation time (minutes):  30 Post-procedure details:    Post-sedation assessment completed:  05/15/2018 12:44 AM   Attendance: Constant attendance by certified staff until patient recovered     Post-sedation assessments completed and reviewed: airway patency, cardiovascular function, hydration status, mental status, nausea/vomiting, pain level, respiratory function and temperature     Patient is stable for discharge or admission: yes     Patient tolerance:  Tolerated well, no immediate complications    Patient is an 84 year old female presented with a fall.  Found to have a right humeral fracture with dislocation.  I was asked to perform procedural sedation for Dr. Marlou Sa to perform reduction.  Patient was found to be bradycardic in the 40s with an EKG that showed sinus bradycardia.  This appears to be something new for her.  Daughter agrees to proceed with sedation in the ER for reduction and understand risks versus benefits.  She confirms patient is a DNR/DNI.  Patient has last eaten at 5:30 PM.  Patient did well with sedation.  She had brief episode of apnea and required ventilation with BVM.  Patient to be admitted to hospitalist service.    EKG Interpretation  Date/Time:  Friday May 15 2018 00:15:58 EDT Ventricular Rate:  49 PR Interval:    QRS Duration: 143 QT Interval:  493 QTC Calculation: 446 R Axis:   -65 Text Interpretation:  Sinus or ectopic atrial bradycardia Prolonged PR interval LVH with IVCD, LAD and secondary repol abnrm No significant change since last tracing Confirmed by Sharea Guinther, Cyril Mourning [phone]) on 05/15/2018 12:48:18 AM         Shanta Dorvil, Delice Bison, DO 05/15/18 7425

## 2018-05-15 NOTE — Consult Note (Signed)
Reason for Consult: Right arm pain Referring Physician: Dr. Jeanmarie Hubert AILED James is an 84 y.o. female.  HPI: Patient presents after a fall this afternoon.  Unknown if she had loss of consciousness.  She has multiple other medical issues ongoing.  She describes right shoulder pain but denies any other orthopedic complaints.  She is right-hand dominant.  She is currently living in a memory care unit.  She does use a walker for ambulation.  Past Medical History:  Diagnosis Date  . Acute MI, lateral wall ([place])   . Aortic stenosis   . Chronic kidney disease   . Chronic systolic heart failure ([place])   . Coronary artery disease    sees Dr Claiborne Billings every 6 months  . Dementia ([place])   . Diabetes ([place])   . DM (diabetes mellitus) ([place])    type 2 ; diet controlled  . Heart disease   . HTN (hypertension)   . Immune thrombocytopenic purpura ([place]) 01/21/2013  . Ischemic heart disease   . Kidney disease   . Mitral regurgitation   . Non Hodgkin's lymphoma ([place])    of the throat  . Subdural hematoma, post-traumatic ([place]) 2015   sees Dr Sherwood Gambler every 6 months  . Thrombocytopenia ([place])    sees Dr Marin Olp every 2 weeks ;receives Nplate prn    Past Surgical History:  Procedure Laterality Date  . BONE MARROW BIOPSY  11/22/2002   left posterior iliac crest bone marrow biopsy and aspirate  . CATARACT EXTRACTION W/ INTRAOCULAR LENS  IMPLANT, BILATERAL Bilateral   . CORONARY ARTERY BYPASS GRAFT  11/20/2007   x5  . EYE SURGERY    . submental lymph node excisional biopsy  10/22/2002    Family History  Problem Relation Age of Onset  . Heart attack Father   . Diabetes Father     Social History:  reports that she has never smoked. She has never used smokeless tobacco. She reports that she does not drink alcohol or use drugs.  Allergies:  Allergies  Allergen Reactions  . Ace Inhibitors Cough  . Namzaric [Memantine Hcl-Donepezil Hcl]     Increased memory loss, agitation    Medications: I have  reviewed the patient's current medications.  Results for orders placed or performed during the hospital encounter of 05/14/18 (from the past 48 hour(s))  Urinalysis, Routine w reflex microscopic     Status: Abnormal   Collection Time: 05/14/18  7:10 PM  Result Value Ref Range   Color, Urine YELLOW YELLOW   APPearance HAZY (A) CLEAR   Specific Gravity, Urine 1.011 1.005 - 1.030   pH 6.0 5.0 - 8.0   Glucose, UA NEGATIVE NEGATIVE mg/dL   Hgb urine dipstick MODERATE (A) NEGATIVE   Bilirubin Urine NEGATIVE NEGATIVE   Ketones, ur NEGATIVE NEGATIVE mg/dL   Protein, ur 30 (A) NEGATIVE mg/dL   Nitrite NEGATIVE NEGATIVE   Leukocytes, UA LARGE (A) NEGATIVE   RBC / HPF 0-5 0 - 5 RBC/hpf   WBC, UA >50 (H) 0 - 5 WBC/hpf   Bacteria, UA RARE (A) NONE SEEN    Comment: Performed at [facility], [address], [place], [place] [address]  CBC with Differential     Status: Abnormal   Collection Time: 05/14/18  7:29 PM  Result Value Ref Range   WBC 26.0 (H) 4.0 - 10.5 K/uL   RBC 4.33 3.87 - 5.11 MIL/uL   Hemoglobin 14.3 12.0 - 15.0 g/dL   HCT 39.9 36.0 -  46.0 %   MCV 92.1 80.0 - 100.0 fL   MCH 33.0 26.0 - 34.0 pg   MCHC 35.8 30.0 - 36.0 g/dL   RDW 12.6 11.5 - 15.5 %   Platelets 198 150 - 400 K/uL   nRBC 0.0 0.0 - 0.2 %   Neutrophils Relative % 91 %   Neutro Abs 23.7 (H) 1.7 - 7.7 K/uL   Lymphocytes Relative 4 %   Lymphs Abs 1.0 0.7 - 4.0 K/uL   Monocytes Relative 4 %   Monocytes Absolute 1.0 0.1 - 1.0 K/uL   Eosinophils Relative 1 %   Eosinophils Absolute 0.3 0.0 - 0.5 K/uL   Basophils Relative 0 %   Basophils Absolute 0.0 0.0 - 0.1 K/uL   nRBC 0 0 /100 WBC   Burr Cells PRESENT     Comment: Performed at [facility], [place] [address] [place], [place] [address]  Basic metabolic panel     Status: Abnormal   Collection Time: 05/14/18  7:29 PM  Result Value Ref Range   Sodium 120 (L) 135 - 145 mmol/L   Potassium 4.5 3.5 - 5.1 mmol/L   Chloride 87 (L) 98 - 111 mmol/L   CO2 24  22 - 32 mmol/L   Glucose, Bld 193 (H) 70 - 99 mg/dL   BUN 19 8 - 23 mg/dL   Creatinine, Ser 1.25 (H) 0.44 - 1.00 mg/dL   Calcium 9.1 8.9 - 10.3 mg/dL   GFR calc non Af Amer 37 (L) >60 mL/min   GFR calc Af Amer 43 (L) >60 mL/min    Comment: (NOTE) The eGFR has been calculated using the CKD EPI equation. This calculation has not been validated in all clinical situations. eGFR's persistently <60 mL/min signify possible Chronic Kidney Disease.    Anion gap 9 5 - 15    Comment: Performed at [facility] [address] [place], [place] [address]    Dg Chest 2 View  Result Date: 05/14/2018 CLINICAL DATA:  Fall, shoulder pain EXAM: CHEST - 2 VIEW COMPARISON:  05/12/2015 FINDINGS: Prior CABG. Mild cardiomegaly. Lungs clear. No effusions or pneumothorax. Severely displaced right humeral neck fracture noted as seen on today's right shoulder series. IMPRESSION: No acute cardiopulmonary disease. Displaced right humeral neck fracture. Electronically Signed   By: Rolm Baptise M.D.   On: 05/14/2018 20:22   Dg Shoulder Right  Result Date: 05/14/2018 CLINICAL DATA:  Fall, shoulder pain EXAM: RIGHT SHOULDER - 2+ VIEW COMPARISON:  None. FINDINGS: There Is a right humeral neck fracture. The humeral shaft is displaced medially relative to the humeral head which appears located in the glenohumeral fossa. Degenerative changes in the right AC and glenohumeral joints. IMPRESSION: Severely displaced right humeral neck fracture. Electronically Signed   By: Rolm Baptise M.D.   On: 05/14/2018 20:21   Ct Head Wo Contrast  Result Date: 05/14/2018 CLINICAL DATA:  84 year old female with fall, head and neck injury. EXAM: CT HEAD WITHOUT CONTRAST CT CERVICAL SPINE WITHOUT CONTRAST TECHNIQUE: Multidetector CT imaging of the head and cervical spine was performed following the standard protocol without intravenous contrast. Multiplanar CT image reconstructions of the cervical spine were also generated. COMPARISON:   08/22/2015 CT and prior studies FINDINGS: CT HEAD FINDINGS Brain: No acute infarction, hemorrhage, hydrocephalus or significant change noted from the prior study. A large chronic calcified LEFT frontoparietal subdural collection is again identified measuring up to 2.6 cm in maximal diameter causing 6 mm LEFT to RIGHT midline shift-unchanged.  Atrophy and chronic small-vessel white matter ischemic changes are again noted. Vascular: Atherosclerotic calcifications again noted. Skull: Normal. Negative for fracture or focal lesion. Sinuses/Orbits: No acute finding. Other: None. CT CERVICAL SPINE FINDINGS Alignment: Normal. Skull base and vertebrae: No acute fracture. No primary bone lesion or focal pathologic process. Soft tissues and spinal canal: No prevertebral fluid or swelling. No visible canal hematoma. Disc levels: Moderate multilevel degenerative disc disease and spondylosis again noted. Mild multilevel facet arthropathy again identified. Upper chest: No acute abnormality Other: None IMPRESSION: 1. No evidence of acute intracranial abnormality. 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 2. No static evidence of acute injury to the cervical spine. Moderate multilevel degenerative changes 3. Cerebral atrophy and chronic small-vessel white matter ischemic changes. Electronically Signed   By: Margarette Canada M.D.   On: 05/14/2018 20:03   Ct Cervical Spine Wo Contrast  Result Date: 05/14/2018 CLINICAL DATA:  84 year old female with fall, head and neck injury. EXAM: CT HEAD WITHOUT CONTRAST CT CERVICAL SPINE WITHOUT CONTRAST TECHNIQUE: Multidetector CT imaging of the head and cervical spine was performed following the standard protocol without intravenous contrast. Multiplanar CT image reconstructions of the cervical spine were also generated. COMPARISON:  08/22/2015 CT and prior studies FINDINGS: CT HEAD FINDINGS Brain: No acute  infarction, hemorrhage, hydrocephalus or significant change noted from the prior study. A large chronic calcified LEFT frontoparietal subdural collection is again identified measuring up to 2.6 cm in maximal diameter causing 6 mm LEFT to RIGHT midline shift-unchanged. Atrophy and chronic small-vessel white matter ischemic changes are again noted. Vascular: Atherosclerotic calcifications again noted. Skull: Normal. Negative for fracture or focal lesion. Sinuses/Orbits: No acute finding. Other: None. CT CERVICAL SPINE FINDINGS Alignment: Normal. Skull base and vertebrae: No acute fracture. No primary bone lesion or focal pathologic process. Soft tissues and spinal canal: No prevertebral fluid or swelling. No visible canal hematoma. Disc levels: Moderate multilevel degenerative disc disease and spondylosis again noted. Mild multilevel facet arthropathy again identified. Upper chest: No acute abnormality Other: None IMPRESSION: 1. No evidence of acute intracranial abnormality. 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 2. No static evidence of acute injury to the cervical spine. Moderate multilevel degenerative changes 3. Cerebral atrophy and chronic small-vessel white matter ischemic changes. Electronically Signed   By: Margarette Canada M.D.   On: 05/14/2018 20:03   Ct Shoulder Right Wo Contrast  Result Date: 05/14/2018 CLINICAL DATA:  [place].  Right shoulder fracture. EXAM: CT OF THE UPPER RIGHT EXTREMITY WITHOUT CONTRAST TECHNIQUE: Multidetector CT imaging of the upper right extremity was performed according to the standard protocol. COMPARISON:  Radiographs 05/14/2018 FINDINGS: There is a transverse fracture through the humeral neck with medial displacement of the humeral shaft. The humeral shaft is displaced approximately 3 cm and is adjacent to the humeral head and lower aspect of the glenoid. The humeral head is articulating with the  glenoid. No vertical fracture through the greater tuberosity. No glenoid fracture. The [facility] joint is intact. The visualized right ribs are intact and the visualized right lung is clear. Atherosclerotic calcifications involving the ascending aorta and surgical changes from bypass surgery. IMPRESSION: 1. Displaced transverse fracture through the humeral neck as detailed above. Please see 3D images. 2. The other bony structures are intact. Electronically Signed   By: Marijo Sanes M.D.   On: 05/14/2018 21:57    Review of  Systems  Unable to perform ROS: Dementia   Blood pressure 137/72, pulse (!) 51, temperature (!) 97.4 F (36.3 C), temperature source Oral, resp. rate 12, height 5' (1.524 m), weight 48.3 kg, SpO2 100 %. Physical Exam  Constitutional: She appears well-developed.  HENT:  Head: Normocephalic.  Eyes: Pupils are equal, round, and reactive to light.  Neck: Normal range of motion.  Cardiovascular: Normal rate.  Respiratory: Effort normal.  Neurological: She is alert.  Skin: Skin is warm.  Psychiatric: She has a normal mood and affect.  Examination bilateral lower extremities demonstrates perfused feet.  No pain with internal and external rotation of either hip.  No knee effusion bilaterally no crepitus with ankle knee or hip range of motion.  Left arm has reasonable grip strength as well as good elbow and shoulder range of motion.  On the right-hand side there is some bruising ecchymosis around the proximal humeral region.  Grip strength intact bilaterally.  Both hands are perfused.  Radial pulse is diminished symmetrically in both arms.  Assessment/Plan: Impression is right proximal humerus fracture.  Under conscious sedation and after informed consent the proximal humerus fracture was reduced.  This was done with distraction and a posterior and lateral directed force on the humeral shaft in relation to the humeral head.  Postreduction radiographs demonstrated good and improve alignment.   Plan is shoulder immobilization for at least 3 weeks.  We will see how she does with that.  She may need repeat reduction if this becomes displaced.  She should keep the shoulder immobilizer on at all times  G Alphonzo Severance 05/15/2018, 12:42 AM

## 2018-05-15 NOTE — Evaluation (Signed)
Physical Therapy Evaluation Patient Details Name: Lisa James MRN: [id number] DOB: Mar 24, 1928 Today's Date: 05/15/2018   History of Present Illness  Pt is an 84 y/o female admitted from ALF following fall. Unsure of cause, however, imaging revealed R humeral neck fx. Pt is s/p closed reduction. PMH includes MI, dementia, CKD, CAD s/p CABG, CHF, DM, HTN, and subdural hematoma.   Clinical Impression  Pt admitted secondary to problem above with deficits below. Unsure of PLOF as pt with dementia and reports she lives at home. Notes indicate she is from ALF. Pt requiring mod to max A for mobility to the chair this session. Feel pt is at increased risk for falls and feel she will need increased assistance at d/c. Unsure if ALF will be able to provide necessary assist, so pt will likely require SNF pt. Will continue to follow acutely to maximize functional mobility independence and safety.     Follow Up Recommendations SNF;Supervision/Assistance - 24 hour    Equipment Recommendations  None recommended by PT    Recommendations for Other Services       Precautions / Restrictions Precautions Precautions: Fall Required Braces or Orthoses: Sling(RUE ) Restrictions Weight Bearing Restrictions: Yes RUE Weight Bearing: Non weight bearing Other Position/Activity Restrictions: Assumed NWB RUE      Mobility  Bed Mobility Overal bed mobility: Needs Assistance Bed Mobility: Supine to Sit     Supine to sit: Max assist     General bed mobility comments: Max A for LE assist and trunk elevation. Increased time required.   Transfers Overall transfer level: Needs assistance Equipment used: 1 person hand held assist Transfers: Sit to/from [facility] Sit to Stand: Mod assist Stand pivot transfers: Mod assist       General transfer comment: Mod A for steadying assist to stand and to perform stand pivot transfer. Pt with slight posterior lean and taking very short steps to  chair. PT stood in front of pt and had pt hold onto PT's arm using LUE.   Ambulation/Gait                Stairs            Wheelchair Mobility    Modified Rankin (Stroke Patients Only)       Balance Overall balance assessment: Needs assistance Sitting-balance support: No upper extremity supported;Feet supported Sitting balance-Leahy Scale: Fair     Standing balance support: Single extremity supported;During functional activity Standing balance-Leahy Scale: Poor Standing balance comment: Reliant on LUE and external support.                              Pertinent Vitals/Pain Pain Assessment: Faces Faces Pain Scale: Hurts little more Pain Location: R shoulder Pain Descriptors / Indicators: Grimacing;Guarding Pain Intervention(s): Limited activity within patient's tolerance;Monitored during session;Repositioned    Home Living Family/patient expects to be discharged to:: Assisted living                 Additional Comments: Per notes, pt from [facility], ALF, however, pt reports she is from home.     Prior Function           Comments: Unsure of PLOF, as pt with dementia. She reports she was independent with ambulation and ADLs.      Hand Dominance        Extremity/Trunk Assessment   Upper Extremity Assessment Upper Extremity Assessment: RUE deficits/detail RUE Deficits / Details:  RUE in sling.     Lower Extremity Assessment Lower Extremity Assessment: Generalized weakness    Cervical / Trunk Assessment Cervical / Trunk Assessment: Kyphotic  Communication   Communication: HOH  Cognition Arousal/Alertness: Awake/alert Behavior During Therapy: WFL for tasks assessed/performed Overall Cognitive Status: History of cognitive impairments - at baseline                                 General Comments: Dementia at baseline. Reports she hadn't had any falls, however, had one prior to admission. Confused about home  environment as well.       General Comments General comments (skin integrity, edema, etc.): No caregiver present during session.     Exercises     Assessment/Plan    PT Assessment Patient needs continued PT services  PT Problem List Decreased cognition;Decreased knowledge of use of DME;Decreased safety awareness;Decreased knowledge of precautions;Decreased strength;Decreased balance;Decreased range of motion;Decreased mobility       PT Treatment Interventions DME instruction;Gait training;Functional mobility training;Therapeutic activities;Therapeutic exercise;Balance training;Patient/family education;Cognitive remediation    PT Goals (Current goals can be found in the Care Plan section)  Acute Rehab PT Goals Patient Stated Goal: none stated  PT Goal Formulation: Patient unable to participate in goal setting Time For Goal Achievement: 05/29/18 Potential to Achieve Goals: Fair    Frequency Min 2X/week   Barriers to discharge        Co-evaluation               AM-PAC PT "6 Clicks" Daily Activity  Outcome Measure Difficulty turning over in bed (including adjusting bedclothes, sheets and blankets)?: Unable Difficulty moving from lying on back to sitting on the side of the bed? : Unable Difficulty sitting down on and standing up from a chair with arms (e.g., wheelchair, bedside commode, etc,.)?: Unable Help needed moving to and from a bed to chair (including a wheelchair)?: A Lot Help needed walking in hospital room?: A Lot Help needed climbing 3-5 steps with a railing? : Total 6 Click Score: 8    End of Session Equipment Utilized During Treatment: Gait belt;Other (comment)(RUE sling ) Activity Tolerance: Patient tolerated treatment well Patient left: in chair;with call bell/phone within reach;with chair alarm set Nurse Communication: Mobility status PT Visit Diagnosis: Unsteadiness on feet (R26.81);Muscle weakness (generalized) (M62.81);History of falling (Z91.81)     Time: 7564-3329 PT Time Calculation (min) (ACUTE ONLY): 16 min   Charges:   PT Evaluation $PT Eval Moderate Complexity: [place], PT, DPT  Acute Rehabilitation Services  Pager: [phone] Office: [phone]   Rudean Hitt 05/15/2018, 12:22 PM

## 2018-05-15 NOTE — Progress Notes (Signed)
Pt stable - shoulder pain improved Needs sling on at all times F/u 7 days

## 2018-05-15 NOTE — Plan of Care (Signed)
  Problem: Health Behavior/Discharge Planning: Goal: Ability to manage health-related needs will improve Outcome: Progressing   Problem: Clinical Measurements: Goal: Ability to maintain clinical measurements within normal limits will improve Outcome: Progressing   Problem: Pain Managment: Goal: General experience of comfort will improve Outcome: Progressing

## 2018-05-15 NOTE — Progress Notes (Signed)
Preop diagnosis right proximal humerus fracture Postop diagnosis right proximal humerus fracture Procedure closed reduction with conscious sedation right proximal humerus fracture Surgeon attending Alphonzo Severance Anesthesia conscious sedation via Dr. Leonides Schanz Indications Aneliese is an 84 year old patient right proximal humerus fracture who requires reduction under conscious sedation for improve alignment  Procedure in detail a timeout was called.  Conscious sedation was administered with Versed and etomidate using a distraction force along with posterior and lateral movement on the proximal humerus the fracture was reduced.  Post reduction radiographs demonstrated improved alignment.  Patient was placed into a shoulder immobilizer.  Patient tolerated the procedure well without immediate complications

## 2018-05-16 LAB — IRON AND TIBC
IRON: 87 ug/dL (ref 28–170)
SATURATION RATIOS: 46 % — AB (ref 10.4–31.8)
TIBC: 190 ug/dL — AB (ref 250–450)
UIBC: 103 ug/dL

## 2018-05-16 LAB — FOLATE: FOLATE: 12.6 ng/mL (ref >5.9)

## 2018-05-16 LAB — BASIC METABOLIC PANEL
ANION GAP: 6 (ref 5–15)
BUN: 10 mg/dL (ref 8–23)
CALCIUM: 6 mg/dL — AB (ref 8.9–10.3)
CO2: 17 mmol/L — AB (ref 22–32)
Chloride: 108 mmol/L (ref 98–111)
Creatinine, Ser: 0.87 mg/dL (ref 0.44–1.00)
GFR calc Af Amer: >60 mL/min (ref >60)
GFR calc non Af Amer: 58 mL/min — ABNORMAL LOW (ref >60)
GLUCOSE: 80 mg/dL (ref 70–99)
POTASSIUM: 2.9 mmol/L — AB (ref 3.5–5.1)
Sodium: 131 mmol/L — ABNORMAL LOW (ref 135–145)

## 2018-05-16 LAB — CBC
HCT: 28.7 % — ABNORMAL LOW (ref 36.0–46.0)
HEMATOCRIT: 22.5 % — AB (ref 36.0–46.0)
Hemoglobin: 7.9 g/dL — ABNORMAL LOW (ref 12.0–15.0)
Hemoglobin: 10.1 g/dL — ABNORMAL LOW (ref 12.0–15.0)
MCH: 33.5 pg (ref 26.0–34.0)
MCH: 33 pg (ref 26.0–34.0)
MCHC: 35.1 g/dL (ref 30.0–36.0)
MCHC: 35.2 g/dL (ref 30.0–36.0)
MCV: 95.3 fL (ref 80.0–100.0)
MCV: 93.8 fL (ref 80.0–100.0)
NRBC: 0 % (ref 0.0–0.2)
NRBC: 0 % (ref 0.0–0.2)
Platelets: 85 10*3/uL — ABNORMAL LOW (ref 150–400)
Platelets: 109 10*3/uL — ABNORMAL LOW (ref 150–400)
RBC: 2.36 MIL/uL — ABNORMAL LOW (ref 3.87–5.11)
RBC: 3.06 MIL/uL — AB (ref 3.87–5.11)
RDW: 13.2 % (ref 11.5–15.5)
RDW: 13.2 % (ref 11.5–15.5)
WBC: 7.9 10*3/uL (ref 4.0–10.5)
WBC: 9.8 10*3/uL (ref 4.0–10.5)

## 2018-05-16 LAB — SODIUM, URINE, RANDOM: Sodium, Ur: 23 mmol/L

## 2018-05-16 LAB — RETICULOCYTES
Immature Retic Fract: 5.8 % (ref 2.3–15.9)
RBC.: 3.07 MIL/uL — AB (ref 3.87–5.11)
RETIC CT PCT: 1.7 % (ref 0.4–3.1)
Retic Count, Absolute: 52.2 10*3/uL (ref 19.0–186.0)

## 2018-05-16 LAB — ALBUMIN: ALBUMIN: 2 g/dL — AB (ref 3.5–5.0)

## 2018-05-16 LAB — POTASSIUM: POTASSIUM: 4.2 mmol/L (ref 3.5–5.1)

## 2018-05-16 LAB — FERRITIN: Ferritin: 196 ng/mL (ref 11–307)

## 2018-05-16 LAB — OSMOLALITY, URINE: Osmolality, Ur: 395 mOsm/kg (ref 300–900)

## 2018-05-16 LAB — GLUCOSE, CAPILLARY: Glucose-Capillary: 105 mg/dL — ABNORMAL HIGH (ref 70–99)

## 2018-05-16 LAB — VITAMIN B12: Vitamin B-12: 435 pg/mL (ref 180–914)

## 2018-05-16 LAB — MAGNESIUM: Magnesium: 1.1 mg/dL — ABNORMAL LOW (ref 1.7–2.4)

## 2018-05-16 MED ORDER — CALCIUM GLUCONATE-NACL 1-0.675 GM/50ML-% IV SOLN
1.0000 g | Freq: Once | INTRAVENOUS | Status: AC
  Administered 2018-05-16: 1000 mg via INTRAVENOUS
  Filled 2018-05-16: qty 50

## 2018-05-16 MED ORDER — POTASSIUM CHLORIDE CRYS ER 20 MEQ PO TBCR
40.0000 meq | EXTENDED_RELEASE_TABLET | ORAL | Status: AC
  Administered 2018-05-16 (×2): 40 meq via ORAL
  Filled 2018-05-16 (×2): qty 2

## 2018-05-16 MED ORDER — MAGNESIUM SULFATE 50 % IJ SOLN
4.0000 g | Freq: Once | INTRAVENOUS | Status: AC
  Administered 2018-05-16: 4 g via INTRAVENOUS
  Filled 2018-05-16: qty 8

## 2018-05-16 MED ORDER — ACETAMINOPHEN 325 MG PO TABS
650.0000 mg | ORAL_TABLET | Freq: Four times a day (QID) | ORAL | Status: DC | PRN
  Administered 2018-05-17 – 2018-05-26 (×10): 650 mg via ORAL
  Filled 2018-05-16 (×9): qty 2

## 2018-05-16 NOTE — Clinical Social Work Note (Signed)
Clinical Social Work Assessment  Patient Details  Name: Lisa James MRN: [id number] Date of Birth: July 07, 1928  Date of referral:  05/16/18               Reason for consult:  Facility Placement, Discharge Planning                Permission sought to share information with:  Facility Sport and exercise psychologist, Family Supports Permission granted to share information::  No  Name::     Laqueta Jean  Agency::  [facility]; [place]  Relationship::  neice  Contact Information:  [phone] (work) [phone] (cell)  Housing/Transportation Living arrangements for the past 2 months:  [place] of Information:  Other (Comment Required)(neice) Patient Interpreter Needed:  None Criminal Activity/Legal Involvement Pertinent to Current Situation/Hospitalization:  No - Comment as needed Significant Relationships:  Other Family Members Lives with:  Facility Resident Do you feel safe going back to the place where you live?  Yes Need for family participation in patient care:  Yes (Comment)  Care giving concerns: Pt from ALF, requiring more assistance with IADLs and ADLs. Pt niece interested in SNF placement.    Social Worker assessment / plan:  CSW spoke with pt niece, she works at [employer] with Dr. Marlou Sa. She is the main point of contact for her aunt who usually resides at [facility]. Pt niece interested in SNF placement, she feels most interested in [facility] and [facility]. CSW explained that we would send referral and initiate authorization through [facility] when it reopens on Monday. Pt niece states understanding.   Employment status:  Retired Nurse, adult PT Recommendations:  [place], [place] / Referral to community resources:  [place]  Patient/Family's Response to care:  Pt niece amenable to speaking with CSW,  interested in SNF placement. preference for [place] an   Patient/Family's Understanding of and Emotional Response to Diagnosis, Current Treatment, and Prognosis:  Pt niece states understanding of diangosis, current treatment and prognosis. Pt niece was emotionally appropriate via telephone, expresses concern and appropriate goals for pt given current needs.   Emotional Assessment Appearance:  Appears stated age Attitude/Demeanor/Rapport:  Unable to Assess Affect (typically observed):  Unable to Assess Orientation:  Oriented to Self, Fluctuating Orientation (Suspected and/or reported [place]) Alcohol / Substance use:  Not Applicable Psych involvement (Current and /or in the community):  No (Comment)  Discharge Needs  Concerns to be addressed:  Care Coordination Readmission within the last 30 days:  No Current discharge risk:  Cognitively Impaired, Dependent with Mobility, Physical Impairment Barriers to Discharge:  Ship broker, Continued Medical Work up   [employer], [place] 05/16/2018, 2:28 PM

## 2018-05-16 NOTE — NC FL2 (Addendum)
[place] LEVEL OF CARE SCREENING TOOL     IDENTIFICATION  Patient Name: Lisa James Birthdate: 04/29/1928 Sex: female Admission Date (Current Location): 05/14/2018  [facility] and [place] Number:  Herbalist and Address:  [facility] [place], [place] [address] [place], [place] [address]      Provider Number: [id number]  Attending Physician Name and Address:  Mendel Corning, MD  Relative Name and Phone Number:  Laqueta Jean; neice; [phone]    Current Level of Care: Hospital Recommended Level of Care: [place] Prior Approval Number:    Date Approved/Denied:   PASRR Number: [id number]  Discharge Plan: SNF    Current Diagnoses: Patient Active Problem List   Diagnosis Date Noted  . Pressure injury of skin 05/15/2018  . Fall 05/14/2018  . Closed comminuted right humeral fracture 05/14/2018  . UTI (urinary tract infection) 05/14/2018  . Hyponatremia 05/14/2018  . Leucocytosis 05/14/2018  . Altered mental status 05/13/2015  . Idiopathic thrombocytopenic purpura ([place]) 07/06/2013  . Aortic valve stenosis 06/19/2013  . Immune thrombocytopenic purpura ([place]) 01/21/2013  . GERD (gastroesophageal reflux disease) 12/17/2012  . Type II or unspecified type diabetes mellitus with renal manifestations, not stated as uncontrolled(250.40) 12/10/2012  . Chronic kidney disease (CKD), stage III (moderate) ([place]) 11/22/2012  . Diabetes mellitus ([place]) 07/03/2011  . Depression 07/03/2011  . Non-Hodgkin's lymphoma ([place]) 07/03/2011  . CAD (coronary artery disease) 07/03/2011  . PAD (peripheral artery disease) ([place]) 07/03/2011  . Vitamin D deficiency 07/03/2011  . Subdural hematoma ([place]) 07/03/2011  . Confusion 05/12/2011  . Subdural hematoma, acute ([place]) 05/12/2011  . HYPERLIPIDEMIA TYPE IIB / III 05/10/2009  . OLD MYOCARDIAL INFARCTION 05/10/2009  . Occlusion and stenosis of carotid artery without mention of cerebral  infarction 05/10/2009  . HYPERTENSION, BENIGN 11/03/2008  . MITRAL REGURGITATION 10/29/2008  . ISCHEMIC HEART DISEASE 10/29/2008  . SYSTOLIC HEART FAILURE, CHRONIC 10/29/2008    Orientation RESPIRATION BLADDER Height & Weight     Self  Normal Incontinent, External catheter Weight: 99 lb 13.9 oz (45.3 kg) Height:  5\' 1"  (154.9 cm)  BEHAVIORAL SYMPTOMS/MOOD NEUROLOGICAL BOWEL NUTRITION STATUS      Incontinent Diet  AMBULATORY STATUS COMMUNICATION OF NEEDS Skin   Extensive Assist Verbally PU Stage and Appropriate Care, Skin abrasions, Other (Comment)(Stage 2 PU on sacrum with foam; MASD on perineum and labia; abrasion on R chest; fissure on sacrum)   PU Stage 2 Dressing: (foam)                   Personal Care Assistance Level of Assistance  Bathing, Feeding, Dressing Bathing Assistance: Maximum assistance Feeding assistance: Limited assistance Dressing Assistance: Maximum assistance     Functional Limitations Info  Sight, Hearing, Speech Sight Info: Adequate Hearing Info: Adequate Speech Info: Adequate    SPECIAL CARE FACTORS FREQUENCY   PT; OT    PT: 5x week OT: 5x week                Contractures Contractures Info: Not present    Additional Factors Info  Psychotropic, Code Status, Allergies Code Status Info: Full Code Allergies Info: Ace Inhibitors Psychotropic Info: memantine (NAMENDA XR) 24 hr capsule 28 mg daily PO; donepezil (ARICEPT) tablet 10 mg daily PO; sertraline (ZOLOFT) tablet 25 mg daily PO         Current Medications (05/16/2018):  This is the current hospital active medication list Current Facility-Administered Medications  Medication Dose Route Frequency Provider Last  Rate Last Dose  . acetaminophen (TYLENOL) tablet 650 mg  650 mg Oral Q6H PRN Rai, Ripudeep K, MD      . atorvastatin (LIPITOR) tablet 20 mg  20 mg Oral q1800 Gala Romney L, MD   20 mg at 05/15/18 1710  . calcium-vitamin D (OSCAL WITH D) 500-200 MG-UNIT per tablet 1 tablet   1 tablet Oral Q breakfast Elwyn Reach, MD   1 tablet at 05/16/18 0848  . carvedilol (COREG) tablet 6.25 mg  6.25 mg Oral BID WC Gala Romney L, MD   6.25 mg at 05/16/18 0848  . cefTRIAXone (ROCEPHIN) 1 g in sodium chloride 0.9 % 100 mL IVPB  1 g Intravenous Q24H Elwyn Reach, MD   Stopped at 05/16/18 0012  . cholecalciferol (VITAMIN D) tablet 1,000 Units  1,000 Units Oral QHS Elwyn Reach, MD   1,000 Units at 05/15/18 2159  . memantine (NAMENDA XR) 24 hr capsule 28 mg  28 mg Oral Daily Jonelle Sidle, Mohammad L, MD   28 mg at 05/16/18 1144   And  . donepezil (ARICEPT) tablet 10 mg  10 mg Oral Daily Elwyn Reach, MD   10 mg at 05/16/18 1110  . eltrombopag (PROMACTA) tablet 50 mg  50 mg Oral Q0600 Garba, Mohammad L, MD      . fluticasone (FLONASE) 50 MCG/ACT nasal spray 2 spray  2 spray Each Nare Daily PRN Gala Romney L, MD      . hydrALAZINE (APRESOLINE) injection 10 mg  10 mg Intravenous Q6H PRN Rai, Ripudeep K, MD   10 mg at 05/16/18 0658  . magnesium sulfate 4 g in dextrose 5 % 250 mL  4 g Intravenous Once Rai, Ripudeep K, MD      . morphine 2 MG/ML injection 1 mg  1 mg Intravenous Q3H PRN Elwyn Reach, MD   1 mg at 05/15/18 1005  . multivitamin (PROSIGHT) tablet 1 tablet  1 tablet Oral Daily Elwyn Reach, MD   1 tablet at 05/16/18 1109  . ondansetron (ZOFRAN) tablet 4 mg  4 mg Oral Q6H PRN Elwyn Reach, MD       Or  . ondansetron (ZOFRAN) injection 4 mg  4 mg Intravenous Q6H PRN Gala Romney L, MD      . sertraline (ZOLOFT) tablet 25 mg  25 mg Oral Daily Gala Romney L, MD   25 mg at 05/16/18 1110  . traZODone (DESYREL) tablet 50-100 mg  50-100 mg Oral QHS PRN Elwyn Reach, MD         Discharge Medications: Please see discharge summary for a list of discharge medications.  Relevant Imaging Results:  Relevant Lab Results:   Additional Information [id number] [place], [place]

## 2018-05-16 NOTE — Social Work (Signed)
CSW left HIPPA compliant message for Lisa James, pt niece listed on facesheet.   Westley Hummer, MSW, [place] Work [phone]

## 2018-05-16 NOTE — Progress Notes (Signed)
[facility]                                                                              Patient Demographics  Lisa James, is a 84 y.o. female, DOB - Sep 16, 1927, WLN:[id number]  Admit date - 05/14/2018   Admitting Physician Elwyn Reach, MD  Outpatient Primary MD for the patient is Merrilee Seashore, MD  Outpatient specialists:   LOS - 2  days   Medical records reviewed and are as summarized below:    Chief Complaint  Patient presents with  . Fall       Brief summary   Patient is a 84 year old female with history of dementia, CAD, hypertension, ITP, recently moved into skilled nursing facility about 2-1/2 weeks ago.  Apparently patient had been unhappy with the move, on the day of admission, she had an unwitnessed fall, fell to her right side.  She was noted to be more confused than her usual.  In ED, patient was found to have UTI as well as right comminuted humeral shaft fracture.  Patient was admitted for further work-up.  She has history of ITP and has been on treatment with Promacta, supposed to have been started on the day of admission after previous injections.  Previously at home but has been declining in health.    Assessment & Plan    Principal Problem:   Fall with displaced transverse fracture through the right humeral neck -CT and x-ray of the right shoulder show displaced transverse fracture through the humeral neck, orthopedics was consulted -Patient underwent closed reduction under conscious sedation, postop day 2 - Per orthopedics, needs sling on at all times, will follow outpatient in 7 days  Active Problems: Acute metabolic encephalopathy in the setting of dementia -Likely worsened due to UTI, currently still somewhat confused oriented to herself and place -Continue IV Rocephin -Continue Namenda, Aricept -Fall precautions  E. coli UTI -Urine culture showed more than 100,000 colonies of E. coli, continue IV Rocephin  -Follow  sensitivity    HYPERTENSION, BENIGN -Currently stable, place on hydralazine IV as needed with parameters,  - continue Coreg.  Holding ACE inhibitor and Spironolactone due to hyponatremia today  History of ITP -Patient follows Dr. Burr Medico, last seen on 04/23/2018 - Patient currently on Promacta, to maintain platelet counts above 50 K -Discussed with Dr. Burr Medico, continue Promacta    CAD (coronary artery disease) -Currently stable, no chest pain or shortness of breath -Continue Coreg, and statin.    Chronic kidney disease (CKD), stage III (moderate) (HCC) -Creatinine currently at baseline 1.2     Hyponatremia -Sodium trending up, 131  Hypokalemia with severe hypomagnesemia 1.1 Replaced K-Dur 40 mEq x2 Placed on IV magnesium 4 g, recheck potassium later today  Hypocalcemia Calcium 6.0, albumin 2.0, corrected calcium 7.6 - will give calcium gluconate 1 g IV x1    Pressure injury of skin -Stage II, sacral, wound care per nursing  Anemia with thrombocytopenia -No obvious bleeding, patient is currently on Promacta due to history of ITP -Repeat CBC stat Hemoglobin 7.9 from 12.6 yesterday, platelets down from 189--> 85K,  DC heparin, ordered HIT panel antibodies, SRA,  anemia panel, FOBT If repeat CBC shows significant anemia, will give 2 units packed RBCs  Code Status: Full CODE STATUS DVT Prophylaxis: SCD's Family Communication: Discussed in detail with the patient, all imaging results, lab results explained to the patient    Disposition Plan: PT recommended skilled nursing facility, social work consult placed  Time Spent in minutes 25 minutes  Procedures:  Closed reduction of right humeral fracture  Consultants:   Orthopedics  Antimicrobials:      Medications  Scheduled Meds: . atorvastatin  20 mg Oral q1800  . calcium-vitamin D  1 tablet Oral Q breakfast  . carvedilol  6.25 mg Oral BID WC  . cholecalciferol  1,000 Units Oral QHS  . memantine  28 mg Oral Daily    And  . donepezil  10 mg Oral Daily  . eltrombopag  50 mg Oral Q0600  . heparin  5,000 Units Subcutaneous Q8H  . multivitamin  1 tablet Oral Daily  . sertraline  25 mg Oral Daily   Continuous Infusions: . cefTRIAXone (ROCEPHIN)  IV Stopped (05/16/18 0012)   PRN Meds:.acetaminophen, fluticasone, hydrALAZINE, morphine injection, ondansetron **OR** ondansetron (ZOFRAN) IV, traZODone   Antibiotics   Anti-infectives (From admission, onward)   Start     Dose/Rate Route Frequency Ordered Stop   05/15/18 2200  cefTRIAXone (ROCEPHIN) 1 g in sodium chloride 0.9 % 100 mL IVPB     1 g 200 mL/hr over 30 Minutes Intravenous Every 24 hours 05/15/18 0156     05/14/18 2200  cefTRIAXone (ROCEPHIN) 1 g in sodium chloride 0.9 % 100 mL IVPB     1 g 200 mL/hr over 30 Minutes Intravenous  Once 05/14/18 2157 05/14/18 2302        Subjective:   Lisa James was seen and examined today.  No acute complaints.  Pain controlled.  No nausea, vomiting, abdominal pain, no obvious bleeding  Objective:   Vitals:   05/15/18 0754 05/15/18 2236 05/16/18 0432 05/16/18 0645  BP: (!) 129/56 (!) 132/54 (!) 181/62 (!) 179/60  Pulse: (!) 50 (!) 52 (!) 57 (!) 54  Resp: 16 14 12    Temp: 97.6 F (36.4 C) 97.9 F (36.6 C)    TempSrc: Oral Oral    SpO2: 99% 99% 99%   Weight:      Height:        Intake/Output Summary (Last 24 hours) at 05/16/2018 1414 Last data filed at 05/16/2018 0654 Gross per 24 hour  Intake 2599 ml  Output 376 ml  Net 2223 ml     Wt Readings from Last 3 Encounters:  05/15/18 45.3 kg  04/23/18 48.3 kg  01/29/18 48.4 kg     Exam    General: Alert and oriented to self, NAD  Eyes:   HEENT:   Cardiovascular: S1 S2 auscultated, Regular rate and rhythm. No pedal edema b/l  Respiratory: Clear to auscultation bilaterally, no wheezing, rales or rhonchi  Gastrointestinal: Soft, nontender, nondistended, + bowel sounds  Ext: no pedal edema bilaterally, right arm in  sling  Neuro: no new FND  Musculoskeletal: No digital cyanosis, clubbing  Skin: No rashes  Psych: somewhat confused, has dementia   Data Reviewed:  I have personally reviewed following labs and imaging studies  Micro Results Recent Results (from the past 240 hour(s))  Urine culture     Status: Abnormal (Preliminary result)   Collection Time: 05/14/18  9:06 PM  Result Value Ref Range Status   Specimen Description URINE, RANDOM  Final  Special Requests NONE  Final   Culture (A)  Final    >=100,000 COLONIES/mL ESCHERICHIA COLI SUSCEPTIBILITIES TO FOLLOW Performed at [facility], [place] [address] [place], [place] [address]    Report Status PENDING  Incomplete  MRSA PCR Screening     Status: None   Collection Time: 05/15/18  8:13 AM  Result Value Ref Range Status   MRSA by PCR NEGATIVE NEGATIVE Final    Comment:        The GeneXpert MRSA Assay (FDA approved for NASAL specimens only), is one component of a comprehensive MRSA colonization surveillance program. It is not intended to diagnose MRSA infection nor to guide or monitor treatment for MRSA infections. Performed at [facility], [place] [address] [place], [place] [address]     Radiology Reports Dg Chest 2 View  Result Date: 05/14/2018 CLINICAL DATA:  Fall, shoulder pain EXAM: CHEST - 2 VIEW COMPARISON:  05/12/2015 FINDINGS: Prior CABG. Mild cardiomegaly. Lungs clear. No effusions or pneumothorax. Severely displaced right humeral neck fracture noted as seen on today's right shoulder series. IMPRESSION: No acute cardiopulmonary disease. Displaced right humeral neck fracture. Electronically Signed   By: Rolm Baptise M.D.   On: 05/14/2018 20:22   Dg Shoulder Right  Result Date: 05/14/2018 CLINICAL DATA:  Fall, shoulder pain EXAM: RIGHT SHOULDER - 2+ VIEW COMPARISON:  None. FINDINGS: There Is a right humeral neck fracture. The humeral shaft is displaced medially relative to the humeral head which appears  located in the glenohumeral fossa. Degenerative changes in the right AC and glenohumeral joints. IMPRESSION: Severely displaced right humeral neck fracture. Electronically Signed   By: Rolm Baptise M.D.   On: 05/14/2018 20:21   Ct Head Wo Contrast  Result Date: 05/14/2018 CLINICAL DATA:  84 year old female with fall, head and neck injury. EXAM: CT HEAD WITHOUT CONTRAST CT CERVICAL SPINE WITHOUT CONTRAST TECHNIQUE: Multidetector CT imaging of the head and cervical spine was performed following the standard protocol without intravenous contrast. Multiplanar CT image reconstructions of the cervical spine were also generated. COMPARISON:  08/22/2015 CT and prior studies FINDINGS: CT HEAD FINDINGS Brain: No acute infarction, hemorrhage, hydrocephalus or significant change noted from the prior study. A large chronic calcified LEFT frontoparietal subdural collection is again identified measuring up to 2.6 cm in maximal diameter causing 6 mm LEFT to RIGHT midline shift-unchanged. Atrophy and chronic small-vessel white matter ischemic changes are again noted. Vascular: Atherosclerotic calcifications again noted. Skull: Normal. Negative for fracture or focal lesion. Sinuses/Orbits: No acute finding. Other: None. CT CERVICAL SPINE FINDINGS Alignment: Normal. Skull base and vertebrae: No acute fracture. No primary bone lesion or focal pathologic process. Soft tissues and spinal canal: No prevertebral fluid or swelling. No visible canal hematoma. Disc levels: Moderate multilevel degenerative disc disease and spondylosis again noted. Mild multilevel facet arthropathy again identified. Upper chest: No acute abnormality Other: None IMPRESSION: 1. No evidence of acute intracranial abnormality. 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 2. No static evidence of acute injury to the cervical spine. Moderate multilevel degenerative changes 3.  Cerebral atrophy and chronic small-vessel white matter ischemic changes. Electronically Signed   By: Margarette Canada M.D.   On: 05/14/2018 20:03   Ct Cervical Spine Wo Contrast  Result Date: 05/14/2018 CLINICAL DATA:  84 year old female with fall, head and neck injury. EXAM: CT HEAD WITHOUT CONTRAST CT CERVICAL SPINE WITHOUT CONTRAST TECHNIQUE: Multidetector CT imaging of the head  and cervical spine was performed following the standard protocol without intravenous contrast. Multiplanar CT image reconstructions of the cervical spine were also generated. COMPARISON:  08/22/2015 CT and prior studies FINDINGS: CT HEAD FINDINGS Brain: No acute infarction, hemorrhage, hydrocephalus or significant change noted from the prior study. A large chronic calcified LEFT frontoparietal subdural collection is again identified measuring up to 2.6 cm in maximal diameter causing 6 mm LEFT to RIGHT midline shift-unchanged. Atrophy and chronic small-vessel white matter ischemic changes are again noted. Vascular: Atherosclerotic calcifications again noted. Skull: Normal. Negative for fracture or focal lesion. Sinuses/Orbits: No acute finding. Other: None. CT CERVICAL SPINE FINDINGS Alignment: Normal. Skull base and vertebrae: No acute fracture. No primary bone lesion or focal pathologic process. Soft tissues and spinal canal: No prevertebral fluid or swelling. No visible canal hematoma. Disc levels: Moderate multilevel degenerative disc disease and spondylosis again noted. Mild multilevel facet arthropathy again identified. Upper chest: No acute abnormality Other: None IMPRESSION: 1. No evidence of acute intracranial abnormality. 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 2. No static evidence of acute injury to the cervical spine. Moderate multilevel degenerative changes 3. Cerebral atrophy and chronic small-vessel white matter ischemic changes.  Electronically Signed   By: Margarette Canada M.D.   On: 05/14/2018 20:03   Ct Shoulder Right Wo Contrast  Result Date: 05/14/2018 CLINICAL DATA:  [place].  Right shoulder fracture. EXAM: CT OF THE UPPER RIGHT EXTREMITY WITHOUT CONTRAST TECHNIQUE: Multidetector CT imaging of the upper right extremity was performed according to the standard protocol. COMPARISON:  Radiographs 05/14/2018 FINDINGS: There is a transverse fracture through the humeral neck with medial displacement of the humeral shaft. The humeral shaft is displaced approximately 3 cm and is adjacent to the humeral head and lower aspect of the glenoid. The humeral head is articulating with the glenoid. No vertical fracture through the greater tuberosity. No glenoid fracture. The [facility] joint is intact. The visualized right ribs are intact and the visualized right lung is clear. Atherosclerotic calcifications involving the ascending aorta and surgical changes from bypass surgery. IMPRESSION: 1. Displaced transverse fracture through the humeral neck as detailed above. Please see 3D images. 2. The other bony structures are intact. Electronically Signed   By: Marijo Sanes M.D.   On: 05/14/2018 21:57   Dg Humerus Right  Result Date: 05/15/2018 CLINICAL DATA:  Postreduction EXAM: RIGHT HUMERUS - 2+ VIEW COMPARISON:  05/14/2018 FINDINGS: Interval reduction of the displaced humeral neck fracture with improved alignment on this single AP view. No subluxation or dislocation. IMPRESSION: Improved alignment postreduction on this single view. Electronically Signed   By: Rolm Baptise M.D.   On: 05/15/2018 00:58    Lab Data:  CBC: Recent Labs  Lab 05/14/18 1929 05/15/18 0705 05/16/18 0631  WBC 26.0* 18.3* 7.9  NEUTROABS 23.7* 14.3*  --   HGB 14.3 12.6 7.9*  HCT 39.9 35.0* 22.5*  MCV 92.1 92.8 95.3  PLT 198 189 85*   Basic Metabolic Panel: Recent Labs  Lab 05/14/18 1929 05/15/18 0705 05/16/18 0631  NA 120* 123* 131*  K 4.5 4.3 2.9*  CL 87* 92* 108   CO2 24 22 17*  GLUCOSE 193* 108* 80  BUN 19 17 10   CREATININE 1.25* 1.26* 0.87  CALCIUM 9.1 8.7* 6.0*  MG  --   --  1.1*   GFR: Estimated Creatinine Clearance: 32 mL/min (by C-G formula based on SCr of 0.87 mg/dL). Liver Function Tests:  Recent Labs  Lab 05/15/18 0705 05/16/18 0631  AST 29  --   ALT 22  --   ALKPHOS 69  --   BILITOT 1.3*  --   PROT 5.1*  --   ALBUMIN 3.2* 2.0*   No results for input(s): LIPASE, AMYLASE in the last 168 hours. No results for input(s): AMMONIA in the last 168 hours. Coagulation Profile: No results for input(s): INR, PROTIME in the last 168 hours. Cardiac Enzymes: No results for input(s): CKTOTAL, CKMB, CKMBINDEX, TROPONINI in the last 168 hours. BNP (last 3 results) No results for input(s): PROBNP in the last 8760 hours. HbA1C: No results for input(s): HGBA1C in the last 72 hours. CBG: Recent Labs  Lab 05/16/18 0730  GLUCAP 105*   Lipid Profile: No results for input(s): CHOL, HDL, LDLCALC, TRIG, CHOLHDL, LDLDIRECT in the last 72 hours. Thyroid Function Tests: No results for input(s): TSH, T4TOTAL, FREET4, T3FREE, THYROIDAB in the last 72 hours. Anemia Panel: No results for input(s): VITAMINB12, FOLATE, FERRITIN, TIBC, IRON, RETICCTPCT in the last 72 hours. Urine analysis:    Component Value Date/Time   COLORURINE YELLOW 05/14/2018 1910   APPEARANCEUR HAZY (A) 05/14/2018 1910   APPEARANCEUR Hazy 11/19/2012 1750   LABSPEC 1.011 05/14/2018 1910   LABSPEC 1.014 11/19/2012 1750   PHURINE 6.0 05/14/2018 1910   GLUCOSEU NEGATIVE 05/14/2018 1910   GLUCOSEU Negative 11/19/2012 1750   HGBUR MODERATE (A) 05/14/2018 1910   BILIRUBINUR NEGATIVE 05/14/2018 1910   BILIRUBINUR negative 05/12/2015 1823   BILIRUBINUR Negative 11/19/2012 1750   KETONESUR NEGATIVE 05/14/2018 1910   PROTEINUR 30 (A) 05/14/2018 1910   UROBILINOGEN 0.2 05/13/2015 0504   NITRITE NEGATIVE 05/14/2018 1910   LEUKOCYTESUR LARGE (A) 05/14/2018 1910   LEUKOCYTESUR 2+  11/19/2012 1750     Ripudeep Rai M.D. [facility] 05/16/2018, 2:14 PM  Pager: [phone] Between 7am to 7pm - call Pager - [phone]  After 7pm go to [email or web address] - password [email or web address]  Call night coverage person covering after 7pm

## 2018-05-17 LAB — URINE CULTURE: Culture: >=100000 — AB

## 2018-05-17 LAB — CBC
HCT: 29.3 % — ABNORMAL LOW (ref 36.0–46.0)
Hemoglobin: 10.2 g/dL — ABNORMAL LOW (ref 12.0–15.0)
MCH: 33 pg (ref 26.0–34.0)
MCHC: 34.8 g/dL (ref 30.0–36.0)
MCV: 94.8 fL (ref 80.0–100.0)
NRBC: 0 % (ref 0.0–0.2)
Platelets: 140 10*3/uL — ABNORMAL LOW (ref 150–400)
RBC: 3.09 MIL/uL — ABNORMAL LOW (ref 3.87–5.11)
RDW: 13.2 % (ref 11.5–15.5)
WBC: 14.7 10*3/uL — ABNORMAL HIGH (ref 4.0–10.5)

## 2018-05-17 LAB — HEPARIN INDUCED PLATELET AB (HIT ANTIBODY): HEPARIN INDUCED PLT AB: 0.1 {OD_unit} (ref 0.000–0.400)

## 2018-05-17 MED ORDER — SODIUM CHLORIDE 0.9 % IV SOLN
INTRAVENOUS | Status: DC
  Administered 2018-05-17 – 2018-05-20 (×4): via INTRAVENOUS

## 2018-05-17 MED ORDER — SODIUM POLYSTYRENE SULFONATE 15 GM/60ML PO SUSP
30.0000 g | Freq: Once | ORAL | Status: AC
  Administered 2018-05-17: 30 g via ORAL
  Filled 2018-05-17: qty 120

## 2018-05-17 NOTE — Evaluation (Signed)
Occupational Therapy Evaluation Patient Details Name: Lisa James MRN: [id number] DOB: [birth year] Today's Date: 05/17/2018    History of Present Illness Pt is an 84 y/o female admitted from ALF following fall. Unsure of cause, however, imaging revealed R humeral neck fx. Pt is s/p closed reduction. PMH includes MI, dementia, CKD, CAD s/p CABG, CHF, DM, HTN, and subdural hematoma.    Clinical Impression   PTA patient independent with self care and mobility, progressive decline since move to ALF (per niece).  Pt admitted for above and limited by below (see problem list). Noted significant pain in dominant R UE (in sling) from fx, impaired cognition (baseline dementia), and generalized weakness. Pt requires max- total assist for UB/LB ADL, setup assist for grooming, and +2 max assist for transfers. Pt will benefit from continued OT services while admitted and after dc at SNF level in order to optimize independence and return to PLOF.  Will continue to follow.     Follow Up Recommendations  SNF;Supervision/Assistance - 24 hour    Equipment Recommendations  Other (comment)(TBD at next venue of care)    Recommendations for Other Services       Precautions / Restrictions Precautions Precautions: Fall Required Braces or Orthoses: Sling(R UE ) Restrictions Weight Bearing Restrictions: Yes RUE Weight Bearing: Non weight bearing Other Position/Activity Restrictions: Assumed NWB RUE      Mobility Bed Mobility Overal bed mobility: Needs Assistance Bed Mobility: Supine to Sit     Supine to sit: Max assist     General bed mobility comments: Max A for LE assist and trunk elevation. Increased time required.   Transfers Overall transfer level: Needs assistance Equipment used: (2 person assist) Transfers: Sit to/from Stand Sit to Stand: Max assist;+2 physical assistance         General transfer comment: max assist +2 for sit to stand from low bed, once standing able to complete  mobility to recliner with mod assist +2 for safety    Balance Overall balance assessment: Needs assistance Sitting-balance support: No upper extremity supported;Feet supported Sitting balance-Leahy Scale: Fair     Standing balance support: Single extremity supported;During functional activity Standing balance-Leahy Scale: Poor Standing balance comment: Reliant on LUE and external support.                            ADL either performed or assessed with clinical judgement   ADL Overall ADL's : Needs assistance/impaired     Grooming: Set up;Sitting   Upper Body Bathing: Sitting;Maximal assistance   Lower Body Bathing: Moderate assistance;+2 for physical assistance;Sit to/from stand   Upper Body Dressing : Total assistance;Sitting   Lower Body Dressing: Total assistance;Sit to/from stand;+2 for physical assistance;+2 for safety/equipment   Toilet Transfer: Maximal assistance;+2 for physical assistance;Ambulation(2 person assist ) Toilet Transfer Details (indicate cue type and reason): simulated to recliner  Toileting- Clothing Manipulation and Hygiene: Total assistance;+2 for physical assistance;Sit to/from stand       Functional mobility during ADLs: Moderate assistance;+2 for physical assistance General ADL Comments: +2 assist for transfers and mobility     Vision   Vision Assessment?: No apparent visual deficits     Perception     Praxis      Pertinent Vitals/Pain Pain Assessment: Faces Faces Pain Scale: Hurts even more Pain Location: R shoulder Pain Descriptors / Indicators: Grimacing;Guarding Pain Intervention(s): Limited activity within patient's tolerance;Repositioned     Hand Dominance Right   Extremity/Trunk Assessment Upper  Extremity Assessment Upper Extremity Assessment: RUE deficits/detail;Generalized weakness RUE Deficits / Details: RUE in sling.  RUE: Unable to fully assess due to pain;Unable to fully assess due to immobilization RUE  Coordination: decreased fine motor;decreased gross motor   Lower Extremity Assessment Lower Extremity Assessment: Defer to PT evaluation   Cervical / Trunk Assessment Cervical / Trunk Assessment: Kyphotic   Communication Communication Communication: HOH   Cognition Arousal/Alertness: Awake/alert Behavior During Therapy: WFL for tasks assessed/performed Overall Cognitive Status: History of cognitive impairments - at baseline                                 General Comments: Dementia at baseline. Able to follow 1 step commands consistently   General Comments  niece present during session    Exercises     Shoulder Instructions      Home Living Family/patient expects to be discharged to:: Assisted living                                 Additional Comments: spoke to niece, pt moved to ALF 3 weeks ago      Prior Functioning/Environment Level of Independence: Independent        Comments: neice reports PTA indepedent with ADLs, mobility; assist with IADLs and med mgmt; decline over 3 weeks since move to ALF        OT Problem List: Decreased strength;Decreased activity tolerance;Decreased range of motion;Impaired balance (sitting and/or standing);Decreased coordination;Decreased cognition;Decreased safety awareness;Decreased knowledge of precautions;Pain;Impaired UE functional use      OT Treatment/Interventions: Self-care/ADL training;Therapeutic exercise;Energy conservation;DME and/or AE instruction;Therapeutic activities;Visual/perceptual remediation/compensation;Patient/family education;Balance training;Cognitive remediation/compensation    OT Goals(Current goals can be found in the care plan section) Acute Rehab OT Goals Patient Stated Goal: to get back to ALF after rehab OT Goal Formulation: With family Time For Goal Achievement: 05/31/18 Potential to Achieve Goals: Fair  OT Frequency: Min 3X/week   Barriers to D/C:             Co-evaluation              AM-PAC PT "6 Clicks" Daily Activity     Outcome Measure Help from another person eating meals?: A Little Help from another person taking care of personal grooming?: A Little Help from another person toileting, which includes using toliet, bedpan, or urinal?: Total Help from another person bathing (including washing, rinsing, drying)?: A Lot Help from another person to put on and taking off regular upper body clothing?: Total Help from another person to put on and taking off regular lower body clothing?: Total 6 Click Score: 11   End of Session Equipment Utilized During Treatment: Gait belt Nurse Communication: Mobility status  Activity Tolerance: Patient tolerated treatment well Patient left: in chair;with call bell/phone within reach;with chair alarm set;with family/visitor present  OT Visit Diagnosis: Unsteadiness on feet (R26.81);Other abnormalities of gait and mobility (R26.89);Muscle weakness (generalized) (M62.81);Pain Pain - Right/Left: Right Pain - part of body: Arm;Shoulder                Time: 6433-2951 OT Time Calculation (min): 23 min Charges:  OT General Charges $OT Visit: 1 Visit OT Evaluation $OT Eval Moderate Complexity: 1 Mod OT Treatments $Self Care/Home Management : 8-22 mins  Delight Stare, OT Acute Rehabilitation Services Pager [phone] Office [phone]   Delight Stare 05/17/2018, 10:01 AM

## 2018-05-17 NOTE — Progress Notes (Signed)
[facility]                                                                              Patient Demographics  Lisa James, is a 84 y.o. female, DOB - 1927-12-20, ION:[id number]  Admit date - 05/14/2018   Admitting Physician Elwyn Reach, MD  Outpatient Primary MD for the patient is Merrilee Seashore, MD  Outpatient specialists:   LOS - 3  days   Medical records reviewed and are as summarized below:    Chief Complaint  Patient presents with  . Fall       Brief summary   Patient is a 84 year old female with history of dementia, CAD, hypertension, ITP, recently moved into skilled nursing facility about 2-1/2 weeks ago.  Apparently patient had been unhappy with the move, on the day of admission, she had an unwitnessed fall, fell to her right side.  She was noted to be more confused than her usual.  In ED, patient was found to have UTI as well as right comminuted humeral shaft fracture.  Patient was admitted for further work-up.  She has history of ITP and has been on treatment with Promacta, supposed to have been started on the day of admission after previous injections.  Previously at home but has been declining in health.    Assessment & Plan    Principal Problem:   Fall with displaced transverse fracture through the right humeral neck -CT and x-ray of the right shoulder show displaced transverse fracture through the humeral neck, orthopedics was consulted -Patient underwent closed reduction under conscious sedation, postop day 3 - Per orthopedics, needs sling on at all times, will follow outpatient in 7 days  Active Problems: Acute metabolic encephalopathy in the setting of dementia -Likely worsened due to UTI, mental status much improved, confirmed by niece at the bedside, close to her baseline -Continue IV Rocephin -Continue Namenda, Aricept -Fall precautions  E. coli UTI -Urine culture showed pansensitive E. coli, continue IV Rocephin,      HYPERTENSION, BENIGN -Currently stable, place on hydralazine IV as needed with parameters,  - continue Coreg.  - Holding ACE inhibitor and Spironolactone due to hyponatremia today  History of ITP -Patient follows Dr. Burr Medico, last seen on 04/23/2018 - Patient currently on Promacta, to maintain platelet counts above 50 K -Discussed with Dr. Burr Medico, continue Promacta    CAD (coronary artery disease) -Currently stable, no chest pain or shortness of breath -Continue Coreg, and statin.    Chronic kidney disease (CKD), stage III (moderate) (HCC) -Creatinine baseline 1.2, creatinine 1.3 today with hyperkalemia -Placed on gentle hydration, Kayexalate     Hyponatremia -Sodium trended down to 123, serum osmolarity 261, urine osmolality 395, urine sodium 23 -Discontinue sertraline  Hypokalemia with severe hypomagnesemia 1.1 Potassium elevated 5.8 due to replacement yesterday, Kayexalate x1  Hypocalcemia Calcium level stable 8.5 today after replacement on 10/26     Pressure injury of skin -Stage II, sacral, wound care per nursing  Anemia with thrombocytopenia -No obvious bleeding, patient is currently on Promacta due to history of ITP -Hemoglobin stable 10.2, platelets stable 140   Code Status: Full CODE STATUS  DVT Prophylaxis: SCD's Family Communication: Discussed in detail with the patient, all imaging results, lab results explained to the patient and niece at the bedside   Disposition Plan: PT recommended skilled nursing facility, social work consult placed.  DC likely in a.m. if sodium improving and facility available  Time Spent in minutes 25 minutes  Procedures:  Closed reduction of right humeral fracture  Consultants:   Orthopedics  Antimicrobials:      Medications  Scheduled Meds: . atorvastatin  20 mg Oral q1800  . calcium-vitamin D  1 tablet Oral Q breakfast  . carvedilol  6.25 mg Oral BID WC  . cholecalciferol  1,000 Units Oral QHS  . memantine  28 mg  Oral Daily   And  . donepezil  10 mg Oral Daily  . eltrombopag  50 mg Oral Q0600  . multivitamin  1 tablet Oral Daily   Continuous Infusions: . sodium chloride    . cefTRIAXone (ROCEPHIN)  IV 1 g (05/16/18 2209)   PRN Meds:.acetaminophen, fluticasone, hydrALAZINE, morphine injection, ondansetron **OR** ondansetron (ZOFRAN) IV, traZODone   Antibiotics   Anti-infectives (From admission, onward)   Start     Dose/Rate Route Frequency Ordered Stop   05/15/18 2200  cefTRIAXone (ROCEPHIN) 1 g in sodium chloride 0.9 % 100 mL IVPB     1 g 200 mL/hr over 30 Minutes Intravenous Every 24 hours 05/15/18 0156     05/14/18 2200  cefTRIAXone (ROCEPHIN) 1 g in sodium chloride 0.9 % 100 mL IVPB     1 g 200 mL/hr over 30 Minutes Intravenous  Once 05/14/18 2157 05/14/18 2302        Subjective:   Lisa James was seen and examined today.  Denies any specific complaints.  No nausea, vomiting, diarrhea, pain.  No bleeding.  Objective:   Vitals:   05/16/18 1658 05/16/18 2138 05/17/18 0424 05/17/18 1008  BP: 130/64 104/61 137/83 (!) 114/54  Pulse: (!) 58 (!) 57 (!) 59 (!) 107  Resp: 16 12 16 16   Temp: 98 F (36.7 C) 97.7 F (36.5 C) 98.2 F (36.8 C) 98.1 F (36.7 C)  TempSrc: Oral Oral Oral Oral  SpO2: 98%  96% 96%  Weight:      Height:        Intake/Output Summary (Last 24 hours) at 05/17/2018 1511 Last data filed at 05/17/2018 1132 Gross per 24 hour  Intake 460 ml  Output 0 ml  Net 460 ml     Wt Readings from Last 3 Encounters:  05/15/18 45.3 kg  04/23/18 48.3 kg  01/29/18 48.4 kg     Exam    General: Alert and oriented x 3, NAD  Eyes: P  HEENT:  Atraumatic, normocephalic  Cardiovascular: S1 S2 auscultated, RRR. No pedal edema b/l  Respiratory: Clear to auscultation bilaterally, no wheezing, rales or rhonchi  Gastrointestinal: Soft, nontender, nondistended, + bowel sounds  Ext: no pedal edema bilaterally  Neuro: no new deficits  Musculoskeletal: No  digital cyanosis, clubbing, right arm in sling  Skin: No rashes  Psych: Normal affect and demeanor, alert and oriented x3    Data Reviewed:  I have personally reviewed following labs and imaging studies  Micro Results Recent Results (from the past 240 hour(s))  Urine culture     Status: Abnormal   Collection Time: 05/14/18  9:06 PM  Result Value Ref Range Status   Specimen Description URINE, RANDOM  Final   Special Requests   Final    NONE Performed at [facility]  [facility], [place] [address] [place], [place] [address]    Culture >=100,000 COLONIES/mL ESCHERICHIA COLI (A)  Final   Report Status 05/17/2018 FINAL  Final   Organism ID, Bacteria ESCHERICHIA COLI (A)  Final      Susceptibility   Escherichia coli - MIC*    AMPICILLIN <=2 SENSITIVE Sensitive     CEFAZOLIN <=4 SENSITIVE Sensitive     CEFTRIAXONE <=1 SENSITIVE Sensitive     CIPROFLOXACIN <=0.25 SENSITIVE Sensitive     GENTAMICIN <=1 SENSITIVE Sensitive     IMIPENEM <=0.25 SENSITIVE Sensitive     NITROFURANTOIN <=16 SENSITIVE Sensitive     TRIMETH/SULFA <=20 SENSITIVE Sensitive     AMPICILLIN/SULBACTAM <=2 SENSITIVE Sensitive     PIP/TAZO <=4 SENSITIVE Sensitive     Extended ESBL NEGATIVE Sensitive     * >=100,000 COLONIES/mL ESCHERICHIA COLI  MRSA PCR Screening     Status: None   Collection Time: 05/15/18  8:13 AM  Result Value Ref Range Status   MRSA by PCR NEGATIVE NEGATIVE Final    Comment:        The GeneXpert MRSA Assay (FDA approved for NASAL specimens only), is one component of a comprehensive MRSA colonization surveillance program. It is not intended to diagnose MRSA infection nor to guide or monitor treatment for MRSA infections. Performed at [facility], [place] [address], [place], [place] [address]     Radiology Reports Dg Chest 2 View  Result Date: 05/14/2018 CLINICAL DATA:  Fall, shoulder pain EXAM: CHEST - 2 VIEW COMPARISON:  05/12/2015 FINDINGS: Prior CABG. Mild cardiomegaly. Lungs  clear. No effusions or pneumothorax. Severely displaced right humeral neck fracture noted as seen on today's right shoulder series. IMPRESSION: No acute cardiopulmonary disease. Displaced right humeral neck fracture. Electronically Signed   By: Rolm Baptise M.D.   On: 05/14/2018 20:22   Dg Shoulder Right  Result Date: 05/14/2018 CLINICAL DATA:  Fall, shoulder pain EXAM: RIGHT SHOULDER - 2+ VIEW COMPARISON:  None. FINDINGS: There Is a right humeral neck fracture. The humeral shaft is displaced medially relative to the humeral head which appears located in the glenohumeral fossa. Degenerative changes in the right AC and glenohumeral joints. IMPRESSION: Severely displaced right humeral neck fracture. Electronically Signed   By: Rolm Baptise M.D.   On: 05/14/2018 20:21   Ct Head Wo Contrast  Result Date: 05/14/2018 CLINICAL DATA:  84 year old female with fall, head and neck injury. EXAM: CT HEAD WITHOUT CONTRAST CT CERVICAL SPINE WITHOUT CONTRAST TECHNIQUE: Multidetector CT imaging of the head and cervical spine was performed following the standard protocol without intravenous contrast. Multiplanar CT image reconstructions of the cervical spine were also generated. COMPARISON:  08/22/2015 CT and prior studies FINDINGS: CT HEAD FINDINGS Brain: No acute infarction, hemorrhage, hydrocephalus or significant change noted from the prior study. A large chronic calcified LEFT frontoparietal subdural collection is again identified measuring up to 2.6 cm in maximal diameter causing 6 mm LEFT to RIGHT midline shift-unchanged. Atrophy and chronic small-vessel white matter ischemic changes are again noted. Vascular: Atherosclerotic calcifications again noted. Skull: Normal. Negative for fracture or focal lesion. Sinuses/Orbits: No acute finding. Other: None. CT CERVICAL SPINE FINDINGS Alignment: Normal. Skull base and vertebrae: No acute fracture. No primary bone lesion or focal pathologic process. Soft tissues and spinal  canal: No prevertebral fluid or swelling. No visible canal hematoma. Disc levels: Moderate multilevel degenerative disc disease and spondylosis again noted. Mild multilevel facet arthropathy again identified. Upper chest: No acute abnormality Other:  None IMPRESSION: 1. No evidence of acute intracranial abnormality. 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 2. No static evidence of acute injury to the cervical spine. Moderate multilevel degenerative changes 3. Cerebral atrophy and chronic small-vessel white matter ischemic changes. Electronically Signed   By: Margarette Canada M.D.   On: 05/14/2018 20:03   Ct Cervical Spine Wo Contrast  Result Date: 05/14/2018 CLINICAL DATA:  84 year old female with fall, head and neck injury. EXAM: CT HEAD WITHOUT CONTRAST CT CERVICAL SPINE WITHOUT CONTRAST TECHNIQUE: Multidetector CT imaging of the head and cervical spine was performed following the standard protocol without intravenous contrast. Multiplanar CT image reconstructions of the cervical spine were also generated. COMPARISON:  08/22/2015 CT and prior studies FINDINGS: CT HEAD FINDINGS Brain: No acute infarction, hemorrhage, hydrocephalus or significant change noted from the prior study. A large chronic calcified LEFT frontoparietal subdural collection is again identified measuring up to 2.6 cm in maximal diameter causing 6 mm LEFT to RIGHT midline shift-unchanged. Atrophy and chronic small-vessel white matter ischemic changes are again noted. Vascular: Atherosclerotic calcifications again noted. Skull: Normal. Negative for fracture or focal lesion. Sinuses/Orbits: No acute finding. Other: None. CT CERVICAL SPINE FINDINGS Alignment: Normal. Skull base and vertebrae: No acute fracture. No primary bone lesion or focal pathologic process. Soft tissues and spinal canal: No prevertebral fluid or swelling. No visible canal hematoma. Disc levels:  Moderate multilevel degenerative disc disease and spondylosis again noted. Mild multilevel facet arthropathy again identified. Upper chest: No acute abnormality Other: None IMPRESSION: 1. No evidence of acute intracranial abnormality. 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 2. No static evidence of acute injury to the cervical spine. Moderate multilevel degenerative changes 3. Cerebral atrophy and chronic small-vessel white matter ischemic changes. Electronically Signed   By: Margarette Canada M.D.   On: 05/14/2018 20:03   Ct Shoulder Right Wo Contrast  Result Date: 05/14/2018 CLINICAL DATA:  [place].  Right shoulder fracture. EXAM: CT OF THE UPPER RIGHT EXTREMITY WITHOUT CONTRAST TECHNIQUE: Multidetector CT imaging of the upper right extremity was performed according to the standard protocol. COMPARISON:  Radiographs 05/14/2018 FINDINGS: There is a transverse fracture through the humeral neck with medial displacement of the humeral shaft. The humeral shaft is displaced approximately 3 cm and is adjacent to the humeral head and lower aspect of the glenoid. The humeral head is articulating with the glenoid. No vertical fracture through the greater tuberosity. No glenoid fracture. [facility] joint is intact. The visualized right ribs are intact and the visualized right lung is clear. Atherosclerotic calcifications involving the ascending aorta and surgical changes from bypass surgery. IMPRESSION: 1. Displaced transverse fracture through the humeral neck as detailed above. Please see 3D images. 2. The other bony structures are intact. Electronically Signed   By: Marijo Sanes M.D.   On: 05/14/2018 21:57   Dg Humerus Right  Result Date: 05/15/2018 CLINICAL DATA:  Postreduction EXAM: RIGHT HUMERUS - 2+ VIEW COMPARISON:  05/14/2018 FINDINGS: Interval reduction of the displaced humeral neck fracture with improved alignment on this single AP  view. No subluxation or dislocation. IMPRESSION: Improved alignment postreduction on this single view. Electronically Signed   By: Rolm Baptise M.D.   On: 05/15/2018 00:58    Lab Data:  CBC: Recent Labs  Lab 05/14/18 1929 05/15/18 0705 05/16/18 0631 05/16/18 1442 05/17/18 0643  WBC 26.0* 18.3* 7.9 9.8 14.7*  NEUTROABS 23.7* 14.3*  --   --   --   HGB 14.3 12.6 7.9* 10.1* 10.2*  HCT 39.9 35.0* 22.5* 28.7* 29.3*  MCV 92.1 92.8 95.3 93.8 94.8  PLT 198 189 85* 109* 940*   Basic Metabolic Panel: Recent Labs  Lab 05/14/18 1929 05/15/18 0705 05/16/18 0631 05/16/18 1333 05/17/18 0643  NA 120* 123* 131*  --  123*  K 4.5 4.3 2.9* 4.2 5.8*  CL 87* 92* 108  --  94*  CO2 24 22 17*  --  19*  GLUCOSE 193* 108* 80  --  101*  BUN 19 17 10   --  16  CREATININE 1.25* 1.26* 0.87  --  1.36*  CALCIUM 9.1 8.7* 6.0*  --  8.5*  MG  --   --  1.1*  --   --    GFR: Estimated Creatinine Clearance: 20.4 mL/min (A) (by C-G formula based on SCr of 1.36 mg/dL (H)). Liver Function Tests: Recent Labs  Lab 05/15/18 0705 05/16/18 0631  AST 29  --   ALT 22  --   ALKPHOS 69  --   BILITOT 1.3*  --   PROT 5.1*  --   ALBUMIN 3.2* 2.0*   No results for input(s): LIPASE, AMYLASE in the last 168 hours. No results for input(s): AMMONIA in the last 168 hours. Coagulation Profile: No results for input(s): INR, PROTIME in the last 168 hours. Cardiac Enzymes: No results for input(s): CKTOTAL, CKMB, CKMBINDEX, TROPONINI in the last 168 hours. BNP (last 3 results) No results for input(s): PROBNP in the last 8760 hours. HbA1C: No results for input(s): HGBA1C in the last 72 hours. CBG: Recent Labs  Lab 05/16/18 0730  GLUCAP 105*   Lipid Profile: No results for input(s): CHOL, HDL, LDLCALC, TRIG, CHOLHDL, LDLDIRECT in the last 72 hours. Thyroid Function Tests: No results for input(s): TSH, T4TOTAL, FREET4, T3FREE, THYROIDAB in the last 72 hours. Anemia Panel: Recent Labs    05/16/18 1442   VITAMINB12 435  FOLATE 12.6  FERRITIN 196  TIBC 190*  IRON 87  RETICCTPCT 1.7   Urine analysis:    Component Value Date/Time   COLORURINE YELLOW 05/14/2018 1910   APPEARANCEUR HAZY (A) 05/14/2018 1910   APPEARANCEUR Hazy 11/19/2012 1750   LABSPEC 1.011 05/14/2018 1910   LABSPEC 1.014 11/19/2012 1750   PHURINE 6.0 05/14/2018 1910   GLUCOSEU NEGATIVE 05/14/2018 1910   GLUCOSEU Negative 11/19/2012 1750   HGBUR MODERATE (A) 05/14/2018 1910   BILIRUBINUR NEGATIVE 05/14/2018 1910   BILIRUBINUR negative 05/12/2015 1823   BILIRUBINUR Negative 11/19/2012 1750   KETONESUR NEGATIVE 05/14/2018 1910   PROTEINUR 30 (A) 05/14/2018 1910   UROBILINOGEN 0.2 05/13/2015 0504   NITRITE NEGATIVE 05/14/2018 1910   LEUKOCYTESUR LARGE (A) 05/14/2018 1910   LEUKOCYTESUR 2+ 11/19/2012 1750     Ripudeep Rai M.D. [facility] 05/17/2018, 3:11 PM  Pager: [phone] Between 7am to 7pm - call Pager - [phone]  After 7pm go to [email or web address] - password [email or web address]  Call night coverage person covering after 7pm

## 2018-05-18 LAB — CBC
HCT: 26.9 % — ABNORMAL LOW (ref 36.0–46.0)
Hemoglobin: 9.3 g/dL — ABNORMAL LOW (ref 12.0–15.0)
MCH: 32.7 pg (ref 26.0–34.0)
MCHC: 34.6 g/dL (ref 30.0–36.0)
MCV: 94.7 fL (ref 80.0–100.0)
PLATELETS: 135 10*3/uL — AB (ref 150–400)
RBC: 2.84 MIL/uL — ABNORMAL LOW (ref 3.87–5.11)
RDW: 13.1 % (ref 11.5–15.5)
WBC: 13.3 10*3/uL — AB (ref 4.0–10.5)
nRBC: 0 % (ref 0.0–0.2)

## 2018-05-18 LAB — BASIC METABOLIC PANEL
Anion gap: 10 (ref 5–15)
Anion gap: 4 — ABNORMAL LOW (ref 5–15)
BUN: 16 mg/dL (ref 8–23)
BUN: 11 mg/dL (ref 8–23)
CALCIUM: 7 mg/dL — AB (ref 8.9–10.3)
CHLORIDE: 94 mmol/L — AB (ref 98–111)
CHLORIDE: 102 mmol/L (ref 98–111)
CO2: 19 mmol/L — ABNORMAL LOW (ref 22–32)
CO2: 21 mmol/L — ABNORMAL LOW (ref 22–32)
CREATININE: 1.36 mg/dL — AB (ref 0.44–1.00)
CREATININE: 1.07 mg/dL — AB (ref 0.44–1.00)
Calcium: 8.5 mg/dL — ABNORMAL LOW (ref 8.9–10.3)
GFR calc Af Amer: 39 mL/min — ABNORMAL LOW (ref >60)
GFR calc non Af Amer: 34 mL/min — ABNORMAL LOW (ref >60)
GFR calc non Af Amer: 45 mL/min — ABNORMAL LOW (ref >60)
GFR, EST AFRICAN AMERICAN: 52 mL/min — AB (ref >60)
Glucose, Bld: 101 mg/dL — ABNORMAL HIGH (ref 70–99)
Glucose, Bld: 104 mg/dL — ABNORMAL HIGH (ref 70–99)
Potassium: 5.8 mmol/L — ABNORMAL HIGH (ref 3.5–5.1)
Potassium: 3.9 mmol/L (ref 3.5–5.1)
Sodium: 123 mmol/L — ABNORMAL LOW (ref 135–145)
Sodium: 127 mmol/L — ABNORMAL LOW (ref 135–145)

## 2018-05-18 MED ORDER — BACID PO TABS
2.0000 | ORAL_TABLET | Freq: Three times a day (TID) | ORAL | Status: DC
  Administered 2018-05-18 – 2018-05-26 (×25): 2 via ORAL
  Filled 2018-05-18 (×26): qty 2

## 2018-05-18 MED ORDER — CIPROFLOXACIN HCL 250 MG PO TABS
250.0000 mg | ORAL_TABLET | Freq: Two times a day (BID) | ORAL | Status: AC
  Administered 2018-05-18 – 2018-05-22 (×10): 250 mg via ORAL
  Filled 2018-05-18 (×10): qty 1

## 2018-05-18 MED ORDER — CARVEDILOL 12.5 MG PO TABS
12.5000 mg | ORAL_TABLET | Freq: Two times a day (BID) | ORAL | Status: DC
  Administered 2018-05-18: 12.5 mg via ORAL

## 2018-05-18 MED ORDER — SPIRONOLACTONE 25 MG PO TABS
25.0000 mg | ORAL_TABLET | Freq: Every day | ORAL | Status: DC
  Administered 2018-05-18 – 2018-05-22 (×5): 25 mg via ORAL
  Filled 2018-05-18 (×5): qty 1

## 2018-05-18 MED ORDER — AMLODIPINE BESYLATE 5 MG PO TABS: 5.0000 mg | ORAL_TABLET | Freq: Every day | ORAL | Status: DC

## 2018-05-18 MED ORDER — CARVEDILOL 12.5 MG PO TABS
12.5000 mg | ORAL_TABLET | Freq: Two times a day (BID) | ORAL | Status: DC
  Filled 2018-05-18: qty 1

## 2018-05-18 MED ORDER — CARVEDILOL 6.25 MG PO TABS
6.2500 mg | ORAL_TABLET | Freq: Two times a day (BID) | ORAL | Status: DC
  Administered 2018-05-18 – 2018-05-26 (×16): 6.25 mg via ORAL
  Filled 2018-05-18 (×16): qty 1

## 2018-05-18 MED ORDER — HYDRALAZINE HCL 20 MG/ML IJ SOLN
10.0000 mg | INTRAMUSCULAR | Status: DC | PRN
  Administered 2018-05-19 – 2018-05-22 (×4): 10 mg via INTRAVENOUS
  Filled 2018-05-18 (×4): qty 1

## 2018-05-18 NOTE — Progress Notes (Signed)
[facility]                                                                              Patient Demographics  Lisa James, is a 84 y.o. female, DOB - Jan 13, 1928, ENM:[id number]  Admit date - 05/14/2018   Admitting Physician Elwyn Reach, MD  Outpatient Primary MD for the patient is Lisa Seashore, MD  Outpatient specialists:   LOS - 4  days   Medical records reviewed and are as summarized below:    Chief Complaint  Patient presents with  . Fall       Subjective:   Lisa James was seen and examined today.  Denies any specific complaints.  No nausea, vomiting, diarrhea, pain.  No bleeding.  Pleasantly confused possibly at baseline   Had an extensive conversation with the patient's niece POA at [phone], she is concerned that the patient medication  And on Aricept which has been initiated in a nursing facility may be the cause of her confusion and falls, possible bradycardia. The pros and cons of medication has been discussed with her in detail we concluded to take the patient off both medication now.  Was explained to the patient's niece that her antibiotics will also be narrowed down to p.o. ciprofloxacin due to sensitivity. We are awaiting approval for patient to be discharged back to SNF     Brief summary   Patient is a 84 year old female with history of dementia, CAD, hypertension, ITP, recently moved into skilled nursing facility about 2-1/2 weeks ago.  Apparently patient had been unhappy with the move, on the day of admission, she had an unwitnessed fall, fell to her right side.  She was noted to be more confused than her usual.  In ED, patient was found to have UTI as well as right comminuted humeral shaft fracture.  Patient was admitted for further work-up.  She has history of ITP and has been on treatment with Promacta, supposed to have been started on the day of admission after previous injections.  Previously at home but has been  declining in health.    Assessment & Plan     Fall with displaced transverse fracture through the right humeral neck -CT and x-ray of the right shoulder show displaced transverse fracture through the humeral neck, orthopedics was consulted -Patient underwent closed reduction under conscious sedation, - postop day 4 - Per orthopedics, needs sling on at all times, will follow outpatient in 7 days  Active Problems: Acute metabolic encephalopathy in the setting of dementia -Likely worsened due to UTI, mental status much improved, confirmed by niece at the bedside, close to her baseline -Continue IV Rocephin -Continue Namenda, Aricept -patient niece believes that the medications are the culprit of encephalopathy worsening dementia and falls, after discussing pros and cons we concluded for the patient to be off the medication for now. -Fall precautions  E. coli UTI -Urine culture showed pansensitive E. coli, pansensitive, IV antibiotics Rocephin has been switched to p.o. ciprofloxacin      HYPERTENSION, BENIGN -Currently stable, place on hydralazine IV as needed with parameters,  - continue Coreg.  - Holding ACE inhibitor  -  We restarted her spironolactone today.  History of ITP -Patient follows Dr. Burr Medico, last seen on 04/23/2018 - Patient currently on Promacta, to maintain platelet counts above 50 K -Discussed with Dr. Burr Medico, continue Promacta    CAD (coronary artery disease) -Currently stable, no chest pain or shortness of breath -Continue Coreg, and statin.    Chronic kidney disease (CKD), stage III (moderate) (HCC) -Creatinine baseline 1.2, creatinine 1.3 today with hyperkalemia -Placed on gentle hydration, Kayexalate     Hyponatremia -Sodium trended down to 123, serum osmolarity 261, urine osmolality 395, urine sodium 23 -Discontinue sertraline  Hypokalemia with severe hypomagnesemia 1.1 Potassium elevated 5.8 due to replacement yesterday, Kayexalate  x1  Hypocalcemia Calcium level stable 8.5 today after replacement on 10/26     Pressure injury of skin -Stage II, sacral, wound care per nursing  Anemia with thrombocytopenia -No obvious bleeding, patient is currently on Promacta due to history of ITP -Hemoglobin stable 10.2, platelets stable 140   Code Status: Full CODE STATUS DVT Prophylaxis: SCD's Family Communication: Discussed in detail with the patient, all imaging results, lab results explained to the patient and niece at the bedside   Disposition Plan: PT recommended skilled nursing facility, social work consult placed.  DC likely in a.m. if sodium improving and facility available  Time Spent in minutes 25 minutes  Procedures:  Closed reduction of right humeral fracture  Consultants:   Orthopedics  Antimicrobials:   IV Rocephin changed to p.o. Cipro   Medications  Scheduled Meds: . atorvastatin  20 mg Oral q1800  . calcium-vitamin D  1 tablet Oral Q breakfast  . carvedilol  6.25 mg Oral BID WC  . cholecalciferol  1,000 Units Oral QHS  . ciprofloxacin  250 mg Oral BID  . eltrombopag  50 mg Oral Q0600  . lactobacillus acidophilus  2 tablet Oral TID  . multivitamin  1 tablet Oral Daily  . spironolactone  25 mg Oral Daily   Continuous Infusions: . sodium chloride 75 mL/hr at 05/18/18 0527   PRN Meds:.acetaminophen, fluticasone, hydrALAZINE, morphine injection, ondansetron **OR** ondansetron (ZOFRAN) IV, traZODone   Antibiotics   Anti-infectives (From admission, onward)   Start     Dose/Rate Route Frequency Ordered Stop   05/18/18 1100  ciprofloxacin (CIPRO) tablet 250 mg     250 mg Oral 2 times daily 05/18/18 0939 05/23/18 0759   05/15/18 2200  cefTRIAXone (ROCEPHIN) 1 g in sodium chloride 0.9 % 100 mL IVPB  Status:  Discontinued     1 g 200 mL/hr over 30 Minutes Intravenous Every 24 hours 05/15/18 0156 05/18/18 0939   05/14/18 2200  cefTRIAXone (ROCEPHIN) 1 g in sodium chloride 0.9 % 100 mL IVPB      1 g 200 mL/hr over 30 Minutes Intravenous  Once 05/14/18 2157 05/14/18 2302       Objective:   Vitals:   05/17/18 1954 05/18/18 0509 05/18/18 0821 05/18/18 1327  BP: (!) 161/60 (!) 163/67 (!) 214/86 136/77  Pulse: 61 61 70 (!) 52  Resp: 18 16 17    Temp: 98.2 F (36.8 C)  98.2 F (36.8 C)   TempSrc: Oral  Oral   SpO2: 96% 99% 95%   Weight:      Height:        Intake/Output Summary (Last 24 hours) at 05/18/2018 1657 Last data filed at 05/18/2018 1600 Gross per 24 hour  Intake 1941 ml  Output 300 ml  Net 1641 ml     Wt Readings from Last 3  Encounters:  05/15/18 45.3 kg  04/23/18 48.3 kg  01/29/18 48.4 kg    BP 136/77   Pulse (!) 52   Temp 98.2 F (36.8 C) (Oral)   Resp 17   Ht 5\' 1"  (1.549 m)   Wt 45.3 kg   LMP  (LMP Unknown)   SpO2 95%   BMI 18.87 kg/m    Physical Exam  Constitution:  Alert, cooperative, no distress, pleasantly demented Psychiatric: Normal and stable mood and affect, cognition intact,   HEENT: Normocephalic, PERRL, otherwise with in Normal limits  Chest:Chest symmetric Cardio vascular:  S1/S2, RRR, No murmure, No Rubs or Gallops  pulmonary: Clear to auscultation bilaterally, respirations unlabored, negative wheezes / crackles Abdomen: Soft, non-tender, non-distended, bowel sounds,no masses, no organomegaly Muscular skeletal:  Arise weaknesses, Limited exam - in bed, able to move all 4 extremities, Normal strength,  Neuro: CNII-XII intact. , normal motor and sensation, reflexes intact  Extremities: No pitting edema lower extremities, +2 pulses  Skin: Dry, warm to touch, negative for any Rashes, No open wounds Wounds: per nursing documentation   Data Reviewed:  I have personally reviewed following labs and imaging studies  Micro Results : Urine culture positive for E. coli, pansensitive  Radiology Reports Dg Chest 2 View  Result Date: 05/14/2018 CLINICAL DATA:  Fall, shoulder pain EXAM: CHEST - 2 VIEW COMPARISON:  05/12/2015  FINDINGS: Prior CABG. Mild cardiomegaly. Lungs clear. No effusions or pneumothorax. Severely displaced right humeral neck fracture noted as seen on today's right shoulder series. IMPRESSION: No acute cardiopulmonary disease. Displaced right humeral neck fracture. Electronically Signed   By: Rolm Baptise M.D.   On: 05/14/2018 20:22   Dg Shoulder Right  Result Date: 05/14/2018 CLINICAL DATA:  Fall, shoulder pain EXAM: RIGHT SHOULDER - 2+ VIEW COMPARISON:  None. FINDINGS: There Is a right humeral neck fracture. The humeral shaft is displaced medially relative to the humeral head which appears located in the glenohumeral fossa. Degenerative changes in the right AC and glenohumeral joints. IMPRESSION: Severely displaced right humeral neck fracture. Electronically Signed   By: Rolm Baptise M.D.   On: 05/14/2018 20:21   Ct Head Wo Contrast  Result Date: 05/14/2018 CLINICAL DATA:  84 year old female with fall, head and neck injury. EXAM: CT HEAD WITHOUT CONTRAST CT CERVICAL SPINE WITHOUT CONTRAST TECHNIQUE: Multidetector CT imaging of the head and cervical spine was performed following the standard protocol without intravenous contrast. Multiplanar CT image reconstructions of the cervical spine were also generated. COMPARISON:  08/22/2015 CT and prior studies FINDINGS: CT HEAD FINDINGS Brain: No acute infarction, hemorrhage, hydrocephalus or significant change noted from the prior study. A large chronic calcified LEFT frontoparietal subdural collection is again identified measuring up to 2.6 cm in maximal diameter causing 6 mm LEFT to RIGHT midline shift-unchanged. Atrophy and chronic small-vessel white matter ischemic changes are again noted. Vascular: Atherosclerotic calcifications again noted. Skull: Normal. Negative for fracture or focal lesion. Sinuses/Orbits: No acute finding. Other: None. CT CERVICAL SPINE FINDINGS Alignment: Normal. Skull base and vertebrae: No acute fracture. No primary bone lesion or  focal pathologic process. Soft tissues and spinal canal: No prevertebral fluid or swelling. No visible canal hematoma. Disc levels: Moderate multilevel degenerative disc disease and spondylosis again noted. Mild multilevel facet arthropathy again identified. Upper chest: No acute abnormality Other: None IMPRESSION: 1. No evidence of acute intracranial abnormality. 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 2. No static evidence of acute  injury to the cervical spine. Moderate multilevel degenerative changes 3. Cerebral atrophy and chronic small-vessel white matter ischemic changes. Electronically Signed   By: Margarette Canada M.D.   On: 05/14/2018 20:03   Ct Cervical Spine Wo Contrast  Result Date: 05/14/2018 CLINICAL DATA:  84 year old female with fall, head and neck injury. EXAM: CT HEAD WITHOUT CONTRAST CT CERVICAL SPINE WITHOUT CONTRAST TECHNIQUE: Multidetector CT imaging of the head and cervical spine was performed following the standard protocol without intravenous contrast. Multiplanar CT image reconstructions of the cervical spine were also generated. COMPARISON:  08/22/2015 CT and prior studies FINDINGS: CT HEAD FINDINGS Brain: No acute infarction, hemorrhage, hydrocephalus or significant change noted from the prior study. A large chronic calcified LEFT frontoparietal subdural collection is again identified measuring up to 2.6 cm in maximal diameter causing 6 mm LEFT to RIGHT midline shift-unchanged. Atrophy and chronic small-vessel white matter ischemic changes are again noted. Vascular: Atherosclerotic calcifications again noted. Skull: Normal. Negative for fracture or focal lesion. Sinuses/Orbits: No acute finding. Other: None. CT CERVICAL SPINE FINDINGS Alignment: Normal. Skull base and vertebrae: No acute fracture. No primary bone lesion or focal pathologic process. Soft tissues and spinal canal: No prevertebral fluid or  swelling. No visible canal hematoma. Disc levels: Moderate multilevel degenerative disc disease and spondylosis again noted. Mild multilevel facet arthropathy again identified. Upper chest: No acute abnormality Other: None IMPRESSION: 1. No evidence of acute intracranial abnormality. 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 2. No static evidence of acute injury to the cervical spine. Moderate multilevel degenerative changes 3. Cerebral atrophy and chronic small-vessel white matter ischemic changes. Electronically Signed   By: Margarette Canada M.D.   On: 05/14/2018 20:03   Ct Shoulder Right Wo Contrast  Result Date: 05/14/2018 CLINICAL DATA:  [place].  Right shoulder fracture. EXAM: CT OF THE UPPER RIGHT EXTREMITY WITHOUT CONTRAST TECHNIQUE: Multidetector CT imaging of the upper right extremity was performed according to the standard protocol. COMPARISON:  Radiographs 05/14/2018 FINDINGS: There is a transverse fracture through the humeral neck with medial displacement of the humeral shaft. The humeral shaft is displaced approximately 3 cm and is adjacent to the humeral head and lower aspect of the glenoid. The humeral head is articulating with the glenoid. No vertical fracture through the greater tuberosity. No glenoid fracture. The [facility] joint is intact. The visualized right ribs are intact and the visualized right lung is clear. Atherosclerotic calcifications involving the ascending aorta and surgical changes from bypass surgery. IMPRESSION: 1. Displaced transverse fracture through the humeral neck as detailed above. Please see 3D images. 2. The other bony structures are intact. Electronically Signed   By: Marijo Sanes M.D.   On: 05/14/2018 21:57   Dg Humerus Right  Result Date: 05/15/2018 CLINICAL DATA:  Postreduction EXAM: RIGHT HUMERUS - 2+ VIEW COMPARISON:  05/14/2018 FINDINGS: Interval reduction of the displaced humeral neck  fracture with improved alignment on this single AP view. No subluxation or dislocation. IMPRESSION: Improved alignment postreduction on this single view. Electronically Signed   By: Rolm Baptise M.D.   On: 05/15/2018 00:58    Lab Data:  CBC: Recent Labs  Lab 05/14/18 1929 05/15/18 0705 05/16/18 0631 05/16/18 1442 05/17/18 0643 05/18/18 0543  WBC 26.0* 18.3* 7.9 9.8 14.7* 13.3*  NEUTROABS 23.7* 14.3*  --   --   --   --   HGB 14.3 12.6 7.9* 10.1* 10.2* 9.3*  HCT 39.9 35.0* 22.5* 28.7*  29.3* 26.9*  MCV 92.1 92.8 95.3 93.8 94.8 94.7  PLT 198 189 85* 109* 140* 413*   Basic Metabolic Panel: Recent Labs  Lab 05/14/18 1929 05/15/18 0705 05/16/18 0631 05/16/18 1333 05/17/18 0643 05/18/18 0543  NA 120* 123* 131*  --  123* 127*  K 4.5 4.3 2.9* 4.2 5.8* 3.9  CL 87* 92* 108  --  94* 102  CO2 24 22 17*  --  19* 21*  GLUCOSE 193* 108* 80  --  101* 104*  BUN 19 17 10   --  16 11  CREATININE 1.25* 1.26* 0.87  --  1.36* 1.07*  CALCIUM 9.1 8.7* 6.0*  --  8.5* 7.0*  MG  --   --  1.1*  --   --   --    GFR: Estimated Creatinine Clearance: 26 mL/min (A) (by C-G formula based on SCr of 1.07 mg/dL (H)). Liver Function Tests: Recent Labs  Lab 05/15/18 0705 05/16/18 0631  AST 29  --   ALT 22  --   ALKPHOS 69  --   BILITOT 1.3*  --   PROT 5.1*  --   ALBUMIN 3.2* 2.0*  CBG: Recent Labs  Lab 05/16/18 0730  GLUCAP 105*   Anemia Panel: Recent Labs    05/16/18 1442  VITAMINB12 435  FOLATE 12.6  FERRITIN 196  TIBC 190*  IRON 87  RETICCTPCT 1.7   Urine analysis:    Component Value Date/Time   COLORURINE YELLOW 05/14/2018 1910   APPEARANCEUR HAZY (A) 05/14/2018 1910   APPEARANCEUR Hazy 11/19/2012 1750   LABSPEC 1.011 05/14/2018 1910   LABSPEC 1.014 11/19/2012 1750   PHURINE 6.0 05/14/2018 1910   GLUCOSEU NEGATIVE 05/14/2018 1910   GLUCOSEU Negative 11/19/2012 1750   HGBUR MODERATE (A) 05/14/2018 1910   BILIRUBINUR NEGATIVE 05/14/2018 1910   BILIRUBINUR negative  05/12/2015 1823   BILIRUBINUR Negative 11/19/2012 1750   KETONESUR NEGATIVE 05/14/2018 1910   PROTEINUR 30 (A) 05/14/2018 1910   UROBILINOGEN 0.2 05/13/2015 0504   NITRITE NEGATIVE 05/14/2018 1910   LEUKOCYTESUR LARGE (A) 05/14/2018 1910   LEUKOCYTESUR 2+ 11/19/2012 1750     Deatra James M.D. [facility] 05/18/2018, 4:57 PM  Pager: [phone] Between 7am to 7pm - call Pager - [phone] After 7pm go to [email or web address] - password [email or web address] Call night coverage person covering after 7pm

## 2018-05-18 NOTE — Care Management Important Message (Signed)
Important Message  Patient Details  Name: Lisa James MRN: [id number] Date of Birth: 1927/12/10   Medicare Important Message Given:  Yes    Orbie Pyo 05/18/2018, 4:04 PM

## 2018-05-18 NOTE — Progress Notes (Signed)
Physical Therapy Treatment Patient Details Name: Lisa James MRN: [id number] DOB: 1927/08/02 Today's Date: 05/18/2018    History of Present Illness Pt is an 84 y/o female admitted from ALF following fall. Unsure of cause, however, imaging revealed R humeral neck fx. Pt is s/p closed reduction. PMH includes MI, dementia, CKD, CAD s/p CABG, CHF, DM, HTN, and subdural hematoma.     PT Comments    Pt very pleasant and willing to participate with therapy. Upon entry, therapist adjusted sling and gown for optimal fit, however noted pt attempting to readjust by end of session. We were able to transfer bed>chair with +2 assist for balance support and safety. Rolled up blankets positioned under R elbow to provide support at end of session. Will continue to follow for mobility and further gait training.  Follow Up Recommendations  SNF;Supervision/Assistance - 24 hour     Equipment Recommendations  None recommended by PT    Recommendations for Other Services       Precautions / Restrictions Precautions Precautions: Fall Required Braces or Orthoses: Sling(R UE ) Restrictions Weight Bearing Restrictions: Yes RUE Weight Bearing: Non weight bearing Other Position/Activity Restrictions: Assumed NWB RUE    Mobility  Bed Mobility Overal bed mobility: Needs Assistance Bed Mobility: Supine to Sit     Supine to sit: Max assist     General bed mobility comments: Max A for LE assist and trunk elevation. Increased time required.   Transfers Overall transfer level: Needs assistance Equipment used: 1 person hand held assist(on L, second person assisting on R side) Transfers: Sit to/from Stand Sit to Stand: Max assist;+2 physical assistance Stand pivot transfers: Mod assist       General transfer comment: max assist +2 for sit to stand from low bed, once standing able to complete mobility to recliner with mod assist +2 for safety. Pt took several pivotal steps around to the recliner  chair.   Ambulation/Gait             General Gait Details: Unable to progress this session.    Stairs             Wheelchair Mobility    Modified Rankin (Stroke Patients Only)       Balance Overall balance assessment: Needs assistance Sitting-balance support: No upper extremity supported;Feet supported Sitting balance-Leahy Scale: Fair     Standing balance support: Single extremity supported;During functional activity Standing balance-Leahy Scale: Poor Standing balance comment: Reliant on LUE and external support. Heavy posterior lean                            Cognition Arousal/Alertness: Awake/alert Behavior During Therapy: WFL for tasks assessed/performed Overall Cognitive Status: History of cognitive impairments - at baseline                                 General Comments: Dementia at baseline. Able to follow 1 step commands consistently      Exercises      General Comments        Pertinent Vitals/Pain Pain Assessment: Faces Faces Pain Scale: Hurts even more Pain Location: R shoulder Pain Descriptors / Indicators: Grimacing;Guarding Pain Intervention(s): Monitored during session;Repositioned    Home Living                      Prior Function  PT Goals (current goals can now be found in the care plan section) Acute Rehab PT Goals Patient Stated Goal: to get back to ALF after rehab PT Goal Formulation: Patient unable to participate in goal setting Time For Goal Achievement: 05/29/18 Potential to Achieve Goals: Fair Progress towards PT goals: Progressing toward goals    Frequency    Min 2X/week      PT Plan Current plan remains appropriate    Co-evaluation              AM-PAC PT "6 Clicks" Daily Activity  Outcome Measure  Difficulty turning over in bed (including adjusting bedclothes, sheets and blankets)?: Unable Difficulty moving from lying on back to sitting on the side  of the bed? : Unable Difficulty sitting down on and standing up from a chair with arms (e.g., wheelchair, bedside commode, etc,.)?: Unable Help needed moving to and from a bed to chair (including a wheelchair)?: A Lot Help needed walking in hospital room?: A Lot Help needed climbing 3-5 steps with a railing? : Total 6 Click Score: 8    End of Session Equipment Utilized During Treatment: Gait belt;Other (comment)(RUE sling ) Activity Tolerance: Patient tolerated treatment well;Patient limited by fatigue;Patient limited by pain Patient left: in chair;with call bell/phone within reach;with chair alarm set;with nursing/sitter in room Nurse Communication: Mobility status PT Visit Diagnosis: Unsteadiness on feet (R26.81);Muscle weakness (generalized) (M62.81);History of falling (Z91.81)     Time: 1000-1021 PT Time Calculation (min) (ACUTE ONLY): 21 min  Charges:  $Gait Training: 8-22 mins                     Rolinda Roan, PT, DPT Acute Rehabilitation Services Pager: [phone] Office: [phone]    Thelma Comp 05/18/2018, 11:23 AM

## 2018-05-18 NOTE — Progress Notes (Signed)
PHARMACY NOTE:  ANTIMICROBIAL RENAL DOSAGE ADJUSTMENT  Current antimicrobial regimen includes a mismatch between antimicrobial dosage and estimated renal function.  As per policy approved by the Pharmacy & Therapeutics and Medical Executive Committees, the antimicrobial dosage will be adjusted accordingly.  Current antimicrobial dosage:  Cipro 500 mg PO BID x 5 days  Indication: UTI  Renal Function:  Estimated Creatinine Clearance: 26 mL/min (A) (by C-G formula based on SCr of 1.07 mg/dL (H)). []      On intermittent HD, scheduled: []      On CRRT    Antimicrobial dosage has been changed to:  Cipro 250 mg PO BID x 5 days  Thank you for allowing pharmacy to be a part of this patient's care.  Arty Baumgartner, [facility]  Pager: [phone] or phone: [phone] 05/18/2018 10:22 AM

## 2018-05-19 LAB — CBC
HCT: 26.3 % — ABNORMAL LOW (ref 36.0–46.0)
Hemoglobin: 9.2 g/dL — ABNORMAL LOW (ref 12.0–15.0)
MCH: 33 pg (ref 26.0–34.0)
MCHC: 35 g/dL (ref 30.0–36.0)
MCV: 94.3 fL (ref 80.0–100.0)
PLATELETS: 128 10*3/uL — AB (ref 150–400)
RBC: 2.79 MIL/uL — AB (ref 3.87–5.11)
RDW: 13.2 % (ref 11.5–15.5)
WBC: 11.2 10*3/uL — ABNORMAL HIGH (ref 4.0–10.5)
nRBC: 0 % (ref 0.0–0.2)

## 2018-05-19 LAB — BASIC METABOLIC PANEL
ANION GAP: 4 — AB (ref 5–15)
BUN: 8 mg/dL (ref 8–23)
CALCIUM: 4.9 mg/dL — AB (ref 8.9–10.3)
CO2: 14 mmol/L — ABNORMAL LOW (ref 22–32)
CREATININE: 0.59 mg/dL (ref 0.44–1.00)
Chloride: 114 mmol/L — ABNORMAL HIGH (ref 98–111)
GFR calc non Af Amer: >60 mL/min (ref >60)
GLUCOSE: 77 mg/dL (ref 70–99)
Potassium: 2.4 mmol/L — CL (ref 3.5–5.1)
SODIUM: 132 mmol/L — AB (ref 135–145)

## 2018-05-19 LAB — SEROTONIN RELEASE ASSAY (SRA)
SRA, HIGH DOSE HEPARIN: 1 % (ref 0–20)
SRA, LOW DOSE HEPARIN: 1 % (ref 0–20)

## 2018-05-19 LAB — POTASSIUM: Potassium: 3.8 mmol/L (ref 3.5–5.1)

## 2018-05-19 MED ORDER — CALCIUM GLUCONATE-NACL 2-0.675 GM/100ML-% IV SOLN
2.0000 g | Freq: Once | INTRAVENOUS | Status: AC
  Administered 2018-05-19: 2000 mg via INTRAVENOUS
  Filled 2018-05-19: qty 100

## 2018-05-19 MED ORDER — POTASSIUM CHLORIDE CRYS ER 20 MEQ PO TBCR
40.0000 meq | EXTENDED_RELEASE_TABLET | ORAL | Status: AC
  Administered 2018-05-19 (×2): 40 meq via ORAL
  Filled 2018-05-19 (×2): qty 2

## 2018-05-19 NOTE — Clinical Social Work Note (Signed)
CSW followed up with patient's niece Laqueta Jean ([phone]) regarding facility selection, and confirmed [facility]. Call made to [facility], admissions director with [facility] regarding patient and they can offer a semi-private room. Family agreeable and authorization initiated by [facility] with [facility]. CSW will continue to follow and facilitate discharge to [facility] once authorization received.  Preston Garabedian Givens, MSW, LCSW Licensed Clinical Social Worker [place] [phone]

## 2018-05-19 NOTE — Progress Notes (Signed)
Physical Therapy Treatment Patient Details Name: Lisa James MRN: [id number] DOB: Sep 25, 1927 Today's Date: 05/19/2018    History of Present Illness Pt is an 84 y/o female admitted from ALF following fall. Unsure of cause, however, imaging revealed R humeral neck fx. Pt is s/p closed reduction. PMH includes MI, dementia, CKD, CAD s/p CABG, CHF, DM, HTN, and subdural hematoma.     PT Comments    Pt tolerated session well, eager to mobilize to chair.  Pt requiring significantly less assist than prior sessions, however continue to feel that SNF is most appropriate at d/c to maximize independence, and reduce fall risk prior to return home.      Follow Up Recommendations  SNF;Supervision/Assistance - 24 hour     Equipment Recommendations  None recommended by PT    Recommendations for Other Services       Precautions / Restrictions Precautions Precautions: Fall Required Braces or Orthoses: Sling Restrictions Weight Bearing Restrictions: Yes RUE Weight Bearing: Non weight bearing Other Position/Activity Restrictions: Assumed NWB RUE    Mobility  Bed Mobility Overal bed mobility: Needs Assistance Bed Mobility: Supine to Sit     Supine to sit: Min assist     General bed mobility comments: min to initiate LEs to EOB, tactile cues for sequencing  Transfers Overall transfer level: Needs assistance Equipment used: 1 person hand held assist Transfers: Sit to/from [facility] Sit to Stand: Min assist Stand pivot transfers: Mod assist       General transfer comment: min assist to facilitate forward weight shift during stand and when pivoting, pt with posterior lean throughout but corrects with cues  Ambulation/Gait                 Stairs             Wheelchair Mobility    Modified Rankin (Stroke Patients Only)       Balance         Postural control: Posterior lean                                  Cognition  Arousal/Alertness: Awake/alert Behavior During Therapy: WFL for tasks assessed/performed Overall Cognitive Status: History of cognitive impairments - at baseline                                 General Comments: Dementia at baseline. Able to follow 1 step commands consistently      Exercises      General Comments        Pertinent Vitals/Pain Pain Assessment: No/denies pain    Home Living                      Prior Function            PT Goals (current goals can now be found in the care plan section) Acute Rehab PT Goals Patient Stated Goal: to go home today PT Goal Formulation: With patient Time For Goal Achievement: 05/29/18 Potential to Achieve Goals: Fair Progress towards PT goals: Progressing toward goals    Frequency    Min 2X/week      PT Plan Current plan remains appropriate    Co-evaluation              AM-PAC PT "6 Clicks" Daily Activity  Outcome Measure  Difficulty turning over  in bed (including adjusting bedclothes, sheets and blankets)?: A Little Difficulty moving from lying on back to sitting on the side of the bed? : A Little Difficulty sitting down on and standing up from a chair with arms (e.g., wheelchair, bedside commode, etc,.)?: A Little Help needed moving to and from a bed to chair (including a wheelchair)?: A Little Help needed walking in hospital room?: A Lot Help needed climbing 3-5 steps with a railing? : A Lot 6 Click Score: 16    End of Session   Activity Tolerance: Patient tolerated treatment well Patient left: in chair;with call bell/phone within reach;with chair alarm set Nurse Communication: Mobility status PT Visit Diagnosis: Unsteadiness on feet (R26.81);Muscle weakness (generalized) (M62.81);History of falling (Z91.81)     Time: 3748-2707 PT Time Calculation (min) (ACUTE ONLY): 8 min  Charges:  $Therapeutic Activity: 8-22 mins                        Michel Santee 05/19/2018,  2:54 PM

## 2018-05-19 NOTE — Progress Notes (Signed)
[facility]                                                                              Patient Demographics  Lisa James, is a 84 y.o. female, DOB - April 11, 1928, LMB:[id number]  Admit date - 05/14/2018   Admitting Physician Elwyn Reach, MD  Outpatient Primary MD for the patient is Merrilee Seashore, MD  Outpatient specialists:   LOS - 5  days   Medical records reviewed and are as summarized below:    Chief Complaint  Patient presents with  . Fall       Brief summary   Patient is a 84 year old female with history of dementia, CAD, hypertension, ITP, recently moved into skilled nursing facility about 2-1/2 weeks ago.  Apparently patient had been unhappy with the move, on the day of admission, she had an unwitnessed fall, fell to her right side.  She was noted to be more confused than her usual.  In ED, patient was found to have UTI as well as right comminuted humeral shaft fracture.  Patient was admitted for further work-up.  She has history of ITP and has been on treatment with Promacta, supposed to have been started on the day of admission after previous injections.  Previously at home but has been declining in health.    Assessment & Plan    Principal Problem:   Fall with displaced transverse fracture through the right humeral neck -CT and x-ray of the right shoulder show displaced transverse fracture through the humeral neck, orthopedics was consulted -Patient underwent closed reduction under conscious sedation, postop day 5 - Per orthopedics, needs sling on at all times, will follow outpatient in 7 days  Active Problems: Acute metabolic encephalopathy in the setting of dementia -Likely worsened due to UTI, mental status much improved, confirmed by niece at the bedside, close to her baseline -fall precautions  -Family requested Aricept-Namenda to be discontinued as  it is not helping  E. coli UTI -Urine culture showed pansensitive E. coli,  patient was placed on IV Rocephin, now transition to oral Cipro     HYPERTENSION, BENIGN -Currently stable, place on hydralazine IV as needed with parameters,  - continue Coreg.  Aldactone  Hypokalemia, hypocalcemia -Replaced, recheck the potassium 3.8 -Corrected calcium 6.5 with albumin 2.0, will give calcium gluconate 2 g IV x1 and continue oral calcium replacement  History of ITP -Patient follows Dr. Burr Medico, last seen on 04/23/2018 - Patient currently on Promacta, to maintain platelet counts above 50 K -Discussed with Dr. Burr Medico, continue Promacta, least was requested to bring Promacta    CAD (coronary artery disease) -Currently stable, no chest pain or shortness of breath -Continue Coreg, and statin.    Chronic kidney disease (CKD), stage III (moderate) (HCC) -Creatinine currently at baseline 0.59      Hyponatremia -Sertraline discontinued, sodium improving to 132    Pressure injury of skin -Stage II, sacral, wound care per nursing  Anemia with thrombocytopenia -No obvious bleeding, patient is currently on Promacta due to history of ITP -Hemoglobin stable, platelets 128K, follow closely   Code Status: Full CODE STATUS DVT Prophylaxis: SCD's Family Communication:  Discussed in detail with the patient, all imaging results, lab results explained to the patient   Disposition Plan: PT recommended skilled nursing facility, social work consult placed.  Insurance authorization started, DC when bed available's Time Spent in minutes 25 minutes  Procedures:  Closed reduction of right humeral fracture  Consultants:   Orthopedics  Antimicrobials:      Medications  Scheduled Meds: . atorvastatin  20 mg Oral q1800  . calcium-vitamin D  1 tablet Oral Q breakfast  . carvedilol  6.25 mg Oral BID WC  . cholecalciferol  1,000 Units Oral QHS  . ciprofloxacin  250 mg Oral BID  . eltrombopag  50 mg Oral Q0600  . lactobacillus acidophilus  2 tablet Oral TID  . multivitamin  1  tablet Oral Daily  . spironolactone  25 mg Oral Daily   Continuous Infusions: . sodium chloride 75 mL/hr at 05/18/18 0527   PRN Meds:.acetaminophen, fluticasone, hydrALAZINE, morphine injection, ondansetron **OR** ondansetron (ZOFRAN) IV, traZODone   Antibiotics   Anti-infectives (From admission, onward)   Start     Dose/Rate Route Frequency Ordered Stop   05/18/18 1100  ciprofloxacin (CIPRO) tablet 250 mg     250 mg Oral 2 times daily 05/18/18 0939 05/23/18 0759   05/15/18 2200  cefTRIAXone (ROCEPHIN) 1 g in sodium chloride 0.9 % 100 mL IVPB  Status:  Discontinued     1 g 200 mL/hr over 30 Minutes Intravenous Every 24 hours 05/15/18 0156 05/18/18 0939   05/14/18 2200  cefTRIAXone (ROCEPHIN) 1 g in sodium chloride 0.9 % 100 mL IVPB     1 g 200 mL/hr over 30 Minutes Intravenous  Once 05/14/18 2157 05/14/18 2302        Subjective:   Lisa James was seen and examined today.  No complaints, wants to go home.  Right arm in sling.   No nausea, vomiting, diarrhea, pain.  No bleeding.  Objective:   Vitals:   05/19/18 0446 05/19/18 0453 05/19/18 0628 05/19/18 0936  BP: (!) 206/64 (!) 191/58 (!) 164/57 (!) 120/49  Pulse: (!) 59 62 (!) 56 (!) 55  Resp: 14   16  Temp: 97.7 F (36.5 C)   97.6 F (36.4 C)  TempSrc: Oral   Oral  SpO2: 100% 100% 98% 97%  Weight:      Height:        Intake/Output Summary (Last 24 hours) at 05/19/2018 1702 Last data filed at 05/19/2018 1500 Gross per 24 hour  Intake 1869.99 ml  Output 650 ml  Net 1219.99 ml     Wt Readings from Last 3 Encounters:  05/15/18 45.3 kg  04/23/18 48.3 kg  01/29/18 48.4 kg     Exam   General: Alert and oriented x 3, NAD Eyes:  HEENT:   Cardiovascular: S1 S2 auscultated, RRR No pedal edema b/l Respiratory: Clear to auscultation bilaterally, no wheezing, rales or rhonchi Gastrointestinal: Soft, nontender, nondistended, + bowel sounds Ext: no pedal edema bilaterally, right arm in sling Neuro: no new  deficits Musculoskeletal: No digital cyanosis, clubbing Skin: No rashes Psych: Normal affect and demeanor, alert and oriented x3      Data Reviewed:  I have personally reviewed following labs and imaging studies  Micro Results Recent Results (from the past 240 hour(s))  Urine culture     Status: Abnormal   Collection Time: 05/14/18  9:06 PM  Result Value Ref Range Status   Specimen Description URINE, RANDOM  Final   Special Requests   Final  NONE Performed at [facility], [place] [address], [place], [place] [address]    Culture >=100,000 COLONIES/mL ESCHERICHIA COLI (A)  Final   Report Status 05/17/2018 FINAL  Final   Organism ID, Bacteria ESCHERICHIA COLI (A)  Final      Susceptibility   Escherichia coli - MIC*    AMPICILLIN <=2 SENSITIVE Sensitive     CEFAZOLIN <=4 SENSITIVE Sensitive     CEFTRIAXONE <=1 SENSITIVE Sensitive     CIPROFLOXACIN <=0.25 SENSITIVE Sensitive     GENTAMICIN <=1 SENSITIVE Sensitive     IMIPENEM <=0.25 SENSITIVE Sensitive     NITROFURANTOIN <=16 SENSITIVE Sensitive     TRIMETH/SULFA <=20 SENSITIVE Sensitive     AMPICILLIN/SULBACTAM <=2 SENSITIVE Sensitive     PIP/TAZO <=4 SENSITIVE Sensitive     Extended ESBL NEGATIVE Sensitive     * >=100,000 COLONIES/mL ESCHERICHIA COLI  MRSA PCR Screening     Status: None   Collection Time: 05/15/18  8:13 AM  Result Value Ref Range Status   MRSA by PCR NEGATIVE NEGATIVE Final    Comment:        The GeneXpert MRSA Assay (FDA approved for NASAL specimens only), is one component of a comprehensive MRSA colonization surveillance program. It is not intended to diagnose MRSA infection nor to guide or monitor treatment for MRSA infections. Performed at [facility], [place] [address], [place], [place] [address]     Radiology Reports Dg Chest 2 View  Result Date: 05/14/2018 CLINICAL DATA:  Fall, shoulder pain EXAM: CHEST - 2 VIEW COMPARISON:  05/12/2015 FINDINGS: Prior CABG. Mild cardiomegaly.  Lungs clear. No effusions or pneumothorax. Severely displaced right humeral neck fracture noted as seen on today's right shoulder series. IMPRESSION: No acute cardiopulmonary disease. Displaced right humeral neck fracture. Electronically Signed   By: Rolm Baptise M.D.   On: 05/14/2018 20:22   Dg Shoulder Right  Result Date: 05/14/2018 CLINICAL DATA:  Fall, shoulder pain EXAM: RIGHT SHOULDER - 2+ VIEW COMPARISON:  None. FINDINGS: There Is a right humeral neck fracture. The humeral shaft is displaced medially relative to the humeral head which appears located in the glenohumeral fossa. Degenerative changes in the right AC and glenohumeral joints. IMPRESSION: Severely displaced right humeral neck fracture. Electronically Signed   By: Rolm Baptise M.D.   On: 05/14/2018 20:21   Ct Head Wo Contrast  Result Date: 05/14/2018 CLINICAL DATA:  85 year old female with fall, head and neck injury. EXAM: CT HEAD WITHOUT CONTRAST CT CERVICAL SPINE WITHOUT CONTRAST TECHNIQUE: Multidetector CT imaging of the head and cervical spine was performed following the standard protocol without intravenous contrast. Multiplanar CT image reconstructions of the cervical spine were also generated. COMPARISON:  08/22/2015 CT and prior studies FINDINGS: CT HEAD FINDINGS Brain: No acute infarction, hemorrhage, hydrocephalus or significant change noted from the prior study. A large chronic calcified LEFT frontoparietal subdural collection is again identified measuring up to 2.6 cm in maximal diameter causing 6 mm LEFT to RIGHT midline shift-unchanged. Atrophy and chronic small-vessel white matter ischemic changes are again noted. Vascular: Atherosclerotic calcifications again noted. Skull: Normal. Negative for fracture or focal lesion. Sinuses/Orbits: No acute finding. Other: None. CT CERVICAL SPINE FINDINGS Alignment: Normal. Skull base and vertebrae: No acute fracture. No primary bone lesion or focal pathologic process. Soft tissues and  spinal canal: No prevertebral fluid or swelling. No visible canal hematoma. Disc levels: Moderate multilevel degenerative disc disease and spondylosis again noted. Mild multilevel facet arthropathy again identified. Upper chest:  No acute abnormality Other: None IMPRESSION: 1. No evidence of acute intracranial abnormality. 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 2. No static evidence of acute injury to the cervical spine. Moderate multilevel degenerative changes 3. Cerebral atrophy and chronic small-vessel white matter ischemic changes. Electronically Signed   By: Margarette Canada M.D.   On: 05/14/2018 20:03   Ct Cervical Spine Wo Contrast  Result Date: 05/14/2018 CLINICAL DATA:  84 year old female with fall, head and neck injury. EXAM: CT HEAD WITHOUT CONTRAST CT CERVICAL SPINE WITHOUT CONTRAST TECHNIQUE: Multidetector CT imaging of the head and cervical spine was performed following the standard protocol without intravenous contrast. Multiplanar CT image reconstructions of the cervical spine were also generated. COMPARISON:  08/22/2015 CT and prior studies FINDINGS: CT HEAD FINDINGS Brain: No acute infarction, hemorrhage, hydrocephalus or significant change noted from the prior study. A large chronic calcified LEFT frontoparietal subdural collection is again identified measuring up to 2.6 cm in maximal diameter causing 6 mm LEFT to RIGHT midline shift-unchanged. Atrophy and chronic small-vessel white matter ischemic changes are again noted. Vascular: Atherosclerotic calcifications again noted. Skull: Normal. Negative for fracture or focal lesion. Sinuses/Orbits: No acute finding. Other: None. CT CERVICAL SPINE FINDINGS Alignment: Normal. Skull base and vertebrae: No acute fracture. No primary bone lesion or focal pathologic process. Soft tissues and spinal canal: No prevertebral fluid or swelling. No visible canal hematoma. Disc  levels: Moderate multilevel degenerative disc disease and spondylosis again noted. Mild multilevel facet arthropathy again identified. Upper chest: No acute abnormality Other: None IMPRESSION: 1. No evidence of acute intracranial abnormality. 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 2. No static evidence of acute injury to the cervical spine. Moderate multilevel degenerative changes 3. Cerebral atrophy and chronic small-vessel white matter ischemic changes. Electronically Signed   By: Margarette Canada M.D.   On: 05/14/2018 20:03   Ct Shoulder Right Wo Contrast  Result Date: 05/14/2018 CLINICAL DATA:  [place].  Right shoulder fracture. EXAM: CT OF THE UPPER RIGHT EXTREMITY WITHOUT CONTRAST TECHNIQUE: Multidetector CT imaging of the upper right extremity was performed according to the standard protocol. COMPARISON:  Radiographs 05/14/2018 FINDINGS: There is a transverse fracture through the humeral neck with medial displacement of the humeral shaft. The humeral shaft is displaced approximately 3 cm and is adjacent to the humeral head and lower aspect of the glenoid. The humeral head is articulating with the glenoid. No vertical fracture through the greater tuberosity. No glenoid fracture. The [facility] joint is intact. The visualized right ribs are intact and the visualized right lung is clear. Atherosclerotic calcifications involving the ascending aorta and surgical changes from bypass surgery. IMPRESSION: 1. Displaced transverse fracture through the humeral neck as detailed above. Please see 3D images. 2. The other bony structures are intact. Electronically Signed   By: Marijo Sanes M.D.   On: 05/14/2018 21:57   Dg Humerus Right  Result Date: 05/15/2018 CLINICAL DATA:  Postreduction EXAM: RIGHT HUMERUS - 2+ VIEW COMPARISON:  05/14/2018 FINDINGS: Interval reduction of the displaced humeral neck fracture with improved alignment on this  single AP view. No subluxation or dislocation. IMPRESSION: Improved alignment postreduction on this single view. Electronically Signed   By: Rolm Baptise M.D.   On: 05/15/2018 00:58    Lab Data:  CBC: Recent Labs  Lab 05/14/18 1929 05/15/18 0947 05/16/18 0631 05/16/18 1442 05/17/18 0962 05/18/18 0543 05/19/18  0649  WBC 26.0* 18.3* 7.9 9.8 14.7* 13.3* 11.2*  NEUTROABS 23.7* 14.3*  --   --   --   --   --   HGB 14.3 12.6 7.9* 10.1* 10.2* 9.3* 9.2*  HCT 39.9 35.0* 22.5* 28.7* 29.3* 26.9* 26.3*  MCV 92.1 92.8 95.3 93.8 94.8 94.7 94.3  PLT 198 189 85* 109* 140* 135* 678*   Basic Metabolic Panel: Recent Labs  Lab 05/15/18 0705 05/16/18 0631 05/16/18 1333 05/17/18 0643 05/18/18 0543 05/19/18 0649 05/19/18 1403  NA 123* 131*  --  123* 127* 132*  --   K 4.3 2.9* 4.2 5.8* 3.9 2.4* 3.8  CL 92* 108  --  94* 102 114*  --   CO2 22 17*  --  19* 21* 14*  --   GLUCOSE 108* 80  --  101* 104* 77  --   BUN 17 10  --  16 11 8   --   CREATININE 1.26* 0.87  --  1.36* 1.07* 0.59  --   CALCIUM 8.7* 6.0*  --  8.5* 7.0* 4.9*  --   MG  --  1.1*  --   --   --   --   --    GFR: Estimated Creatinine Clearance: 34.8 mL/min (by C-G formula based on SCr of 0.59 mg/dL). Liver Function Tests: Recent Labs  Lab 05/15/18 0705 05/16/18 0631  AST 29  --   ALT 22  --   ALKPHOS 69  --   BILITOT 1.3*  --   PROT 5.1*  --   ALBUMIN 3.2* 2.0*   No results for input(s): LIPASE, AMYLASE in the last 168 hours. No results for input(s): AMMONIA in the last 168 hours. Coagulation Profile: No results for input(s): INR, PROTIME in the last 168 hours. Cardiac Enzymes: No results for input(s): CKTOTAL, CKMB, CKMBINDEX, TROPONINI in the last 168 hours. BNP (last 3 results) No results for input(s): PROBNP in the last 8760 hours. HbA1C: No results for input(s): HGBA1C in the last 72 hours. CBG: Recent Labs  Lab 05/16/18 0730  GLUCAP 105*   Lipid Profile: No results for input(s): CHOL, HDL, LDLCALC, TRIG,  CHOLHDL, LDLDIRECT in the last 72 hours. Thyroid Function Tests: No results for input(s): TSH, T4TOTAL, FREET4, T3FREE, THYROIDAB in the last 72 hours. Anemia Panel: No results for input(s): VITAMINB12, FOLATE, FERRITIN, TIBC, IRON, RETICCTPCT in the last 72 hours. Urine analysis:    Component Value Date/Time   COLORURINE YELLOW 05/14/2018 1910   APPEARANCEUR HAZY (A) 05/14/2018 1910   APPEARANCEUR Hazy 11/19/2012 1750   LABSPEC 1.011 05/14/2018 1910   LABSPEC 1.014 11/19/2012 1750   PHURINE 6.0 05/14/2018 1910   GLUCOSEU NEGATIVE 05/14/2018 1910   GLUCOSEU Negative 11/19/2012 1750   HGBUR MODERATE (A) 05/14/2018 1910   BILIRUBINUR NEGATIVE 05/14/2018 1910   BILIRUBINUR negative 05/12/2015 1823   BILIRUBINUR Negative 11/19/2012 1750   KETONESUR NEGATIVE 05/14/2018 1910   PROTEINUR 30 (A) 05/14/2018 1910   UROBILINOGEN 0.2 05/13/2015 0504   NITRITE NEGATIVE 05/14/2018 1910   LEUKOCYTESUR LARGE (A) 05/14/2018 1910   LEUKOCYTESUR 2+ 11/19/2012 1750     Veryl Abril M.D. [facility] 05/19/2018, 5:02 PM  Pager: [phone] Between 7am to 7pm - call Pager - [phone]  After 7pm go to [email or web address] - password [email or web address]  Call night coverage person covering after 7pm

## 2018-05-20 DIAGNOSIS — N183 Chronic kidney disease, stage 3 (moderate): Secondary | ICD-10-CM

## 2018-05-20 DIAGNOSIS — I259 Chronic ischemic heart disease, unspecified: Secondary | ICD-10-CM

## 2018-05-20 DIAGNOSIS — I10 Essential (primary) hypertension: Secondary | ICD-10-CM

## 2018-05-20 DIAGNOSIS — S42351S Displaced comminuted fracture of shaft of humerus, right arm, sequela: Secondary | ICD-10-CM

## 2018-05-20 DIAGNOSIS — I25810 Atherosclerosis of coronary artery bypass graft(s) without angina pectoris: Secondary | ICD-10-CM

## 2018-05-20 DIAGNOSIS — N3000 Acute cystitis without hematuria: Secondary | ICD-10-CM

## 2018-05-20 DIAGNOSIS — D72829 Elevated white blood cell count, unspecified: Secondary | ICD-10-CM

## 2018-05-20 DIAGNOSIS — W19XXXS Unspecified fall, sequela: Secondary | ICD-10-CM

## 2018-05-20 DIAGNOSIS — E871 Hypo-osmolality and hyponatremia: Secondary | ICD-10-CM

## 2018-05-20 DIAGNOSIS — K219 Gastro-esophageal reflux disease without esophagitis: Secondary | ICD-10-CM

## 2018-05-20 DIAGNOSIS — D72823 Leukemoid reaction: Secondary | ICD-10-CM

## 2018-05-20 LAB — BASIC METABOLIC PANEL
ANION GAP: 7 (ref 5–15)
ANION GAP: 6 (ref 5–15)
Anion gap: 6 (ref 5–15)
BUN: 8 mg/dL (ref 8–23)
BUN: 9 mg/dL (ref 8–23)
BUN: 10 mg/dL (ref 8–23)
CALCIUM: 8.4 mg/dL — AB (ref 8.9–10.3)
CALCIUM: 8.2 mg/dL — AB (ref 8.9–10.3)
CHLORIDE: 97 mmol/L — AB (ref 98–111)
CO2: 17 mmol/L — ABNORMAL LOW (ref 22–32)
CO2: 20 mmol/L — ABNORMAL LOW (ref 22–32)
CO2: 20 mmol/L — ABNORMAL LOW (ref 22–32)
CREATININE: 0.88 mg/dL (ref 0.44–1.00)
Calcium: 8.2 mg/dL — ABNORMAL LOW (ref 8.9–10.3)
Chloride: 100 mmol/L (ref 98–111)
Chloride: 95 mmol/L — ABNORMAL LOW (ref 98–111)
Creatinine, Ser: 0.89 mg/dL (ref 0.44–1.00)
Creatinine, Ser: 1.05 mg/dL — ABNORMAL HIGH (ref 0.44–1.00)
GFR calc Af Amer: >60 mL/min (ref >60)
GFR calc Af Amer: >60 mL/min (ref >60)
GFR calc Af Amer: 53 mL/min — ABNORMAL LOW (ref >60)
GFR calc non Af Amer: 57 mL/min — ABNORMAL LOW (ref >60)
GFR, EST NON AFRICAN AMERICAN: 56 mL/min — AB (ref >60)
GFR, EST NON AFRICAN AMERICAN: 46 mL/min — AB (ref >60)
GLUCOSE: 97 mg/dL (ref 70–99)
GLUCOSE: 197 mg/dL — AB (ref 70–99)
Glucose, Bld: 98 mg/dL (ref 70–99)
POTASSIUM: 6.2 mmol/L — AB (ref 3.5–5.1)
POTASSIUM: 4.7 mmol/L (ref 3.5–5.1)
Potassium: 4.8 mmol/L (ref 3.5–5.1)
SODIUM: 124 mmol/L — AB (ref 135–145)
SODIUM: 123 mmol/L — AB (ref 135–145)
Sodium: 121 mmol/L — ABNORMAL LOW (ref 135–145)

## 2018-05-20 LAB — URINALYSIS, ROUTINE W REFLEX MICROSCOPIC
Bacteria, UA: NONE SEEN
Bilirubin Urine: NEGATIVE
GLUCOSE, UA: NEGATIVE mg/dL
Ketones, ur: NEGATIVE mg/dL
Nitrite: NEGATIVE
PH: 5 (ref 5.0–8.0)
Protein, ur: NEGATIVE mg/dL
Specific Gravity, Urine: 1.011 (ref 1.005–1.030)

## 2018-05-20 LAB — CBC
HCT: 31.2 % — ABNORMAL LOW (ref 36.0–46.0)
HCT: 30.9 % — ABNORMAL LOW (ref 36.0–46.0)
HEMOGLOBIN: 10.9 g/dL — AB (ref 12.0–15.0)
Hemoglobin: 10.8 g/dL — ABNORMAL LOW (ref 12.0–15.0)
MCH: 32.4 pg (ref 26.0–34.0)
MCH: 32.9 pg (ref 26.0–34.0)
MCHC: 34.6 g/dL (ref 30.0–36.0)
MCHC: 35.3 g/dL (ref 30.0–36.0)
MCV: 93.7 fL (ref 80.0–100.0)
MCV: 93.4 fL (ref 80.0–100.0)
PLATELETS: 103 10*3/uL — AB (ref 150–400)
Platelets: 206 10*3/uL (ref 150–400)
RBC: 3.33 MIL/uL — ABNORMAL LOW (ref 3.87–5.11)
RBC: 3.31 MIL/uL — ABNORMAL LOW (ref 3.87–5.11)
RDW: 13.2 % (ref 11.5–15.5)
RDW: 13.2 % (ref 11.5–15.5)
WBC: 17.5 10*3/uL — AB (ref 4.0–10.5)
WBC: 18.5 10*3/uL — AB (ref 4.0–10.5)
nRBC: 0 % (ref 0.0–0.2)
nRBC: 0 % (ref 0.0–0.2)

## 2018-05-20 LAB — CBC WITH DIFFERENTIAL/PLATELET
Abs Immature Granulocytes: 0.32 10*3/uL — ABNORMAL HIGH (ref 0.00–0.07)
BASOS ABS: 0 10*3/uL (ref 0.0–0.1)
Basophils Relative: 0 %
EOS ABS: 0.4 10*3/uL (ref 0.0–0.5)
EOS PCT: 3 %
HCT: 29.5 % — ABNORMAL LOW (ref 36.0–46.0)
HEMOGLOBIN: 9.8 g/dL — AB (ref 12.0–15.0)
Immature Granulocytes: 2 %
LYMPHS PCT: 11 %
Lymphs Abs: 1.6 10*3/uL (ref 0.7–4.0)
MCH: 32.1 pg (ref 26.0–34.0)
MCHC: 33.2 g/dL (ref 30.0–36.0)
MCV: 96.7 fL (ref 80.0–100.0)
Monocytes Absolute: 1.1 10*3/uL — ABNORMAL HIGH (ref 0.1–1.0)
Monocytes Relative: 8 %
NRBC: 0 % (ref 0.0–0.2)
Neutro Abs: 11 10*3/uL — ABNORMAL HIGH (ref 1.7–7.7)
Neutrophils Relative %: 76 %
PLATELETS: ADEQUATE 10*3/uL (ref 150–400)
RBC: 3.05 MIL/uL — ABNORMAL LOW (ref 3.87–5.11)
RDW: 13.4 % (ref 11.5–15.5)
WBC: 14.5 10*3/uL — AB (ref 4.0–10.5)

## 2018-05-20 LAB — SODIUM, URINE, RANDOM: SODIUM UR: 141 mmol/L

## 2018-05-20 LAB — MAGNESIUM
MAGNESIUM: 1.5 mg/dL — AB (ref 1.7–2.4)
MAGNESIUM: 1.5 mg/dL — AB (ref 1.7–2.4)

## 2018-05-20 LAB — SODIUM: SODIUM: 115 mmol/L — AB (ref 135–145)

## 2018-05-20 MED ORDER — FUROSEMIDE 10 MG/ML IJ SOLN
20.0000 mg | Freq: Once | INTRAMUSCULAR | Status: AC
  Administered 2018-05-20: 20 mg via INTRAVENOUS
  Filled 2018-05-20: qty 2

## 2018-05-20 MED ORDER — MAGNESIUM SULFATE 4 GM/100ML IV SOLN
4.0000 g | Freq: Once | INTRAVENOUS | Status: AC
  Administered 2018-05-20: 4 g via INTRAVENOUS
  Filled 2018-05-20: qty 100

## 2018-05-20 NOTE — Progress Notes (Signed)
PROGRESS NOTE    Lisa James  VEL:[id number] DOB: June 07, 1928 DOA: 05/14/2018 PCP: Merrilee Seashore, MD   Brief Narrative:  Admitted for right humeral neck fracture and underwent surgery by orthopedics.  Plan is for discharge to SNF however patients with WBC count rose today, and her sodium worsened. Assessment & Plan   Hyponatremia -On admission sodium was 120 however improved to 132 as of 05/19/2018.  However today patient's sodium level was noted to be down to 115 despite receiving IV fluids. -Will discontinue IV fluids -?  Hypovolemic hyponatremia however patient does not appear to be volume overloaded -Will give dose of Lasix IV monitor intake and output, daily weights -Obtain urine sodium as well as UA -Currently mental status does appear to be at baseline, no neurological deficits  Leukocytosis -Upon admission, WBC 26, improved down to 7.9 -However this morning, WBC of 18.5 -Patient does not appear to have further infective etiology, currently being treated for urinary tract infection -will obtain differential, repeat UA, and obtain blood cultures -Currently afebrile with no complaints of upper respiratory symptoms  Fall with displaced transverse fracture through the right humeral neck -CT and x-ray of the right shoulder show displaced transverse fracture through the humeral neck -Orthopedic surgery consulted and appreciated, status post closed reduction under conscious sedation -Orthopedics recommending sling on at all times with outpatient follow-up in 7 days  Acute metabolic encephalopathy in the setting of dementia -Appears to be at baseline, mental status improved -Suspect worsened by UTI -Continue fall precautions -Per family request, Aricept and Namenda have been discontinued as I do not feel it is been helping  Ecoli UTI -Urine culture showed pansensitive E. Coli -Patient was placed on Rocephin and transition to oral ciprofloxacin  Essential  Hypertension -Stable, continue Coreg, Aldactone  Hypokalemia/Hypocalcemia  -resolved, continue to monitor   Hypomagnesemia -Replace and continue to monitor   History of ITP -Patient follows with Dr. Burr Medico, oncology and was last seen on 04/23/2018 -Placed on Promacta to maintain platelet counts above 50,000 -She is to follow-up with oncology as an outpatient  CAD -Stable, no complaints of chest pain or shortness of breath -Continue Coreg, statin  Chronic kidney disease, stage III -Stable  Hyponatremia -Sertraline discontinued  -sodium -Repeat BMP in one week  Pressure injury of skin -Stage II, sacral -Continue wound care  Anemia with thrombocytopenia  -No obvious bleeding -patient is currently on Promacta due to history of ITP -hemoglobin currently 10.9 -platelets 206 -repeat CBC in one week  DVT Prophylaxis  SCDs  Code Status: Full  Family Communication: None at bedside  Disposition Plan: Admitted. Plan to discharge to SNF when sodium level has improved  Procedures: Closed reduction of right humeral fracture  Consultations: Orthopedic surgery Antibiotics   Anti-infectives (From admission, onward)   Start     Dose/Rate Route Frequency Ordered Stop   05/18/18 1100  ciprofloxacin (CIPRO) tablet 250 mg     250 mg Oral 2 times daily 05/18/18 0939 05/23/18 0759   05/15/18 2200  cefTRIAXone (ROCEPHIN) 1 g in sodium chloride 0.9 % 100 mL IVPB  Status:  Discontinued     1 g 200 mL/hr over 30 Minutes Intravenous Every 24 hours 05/15/18 0156 05/18/18 0939   05/14/18 2200  cefTRIAXone (ROCEPHIN) 1 g in sodium chloride 0.9 % 100 mL IVPB     1 g 200 mL/hr over 30 Minutes Intravenous  Once 05/14/18 2157 05/14/18 2302      Subjective:   Lisa James seen and examined today.  Patient with no complaints this morning.  Denies current chest pain, shortness of breath, abdominal pain, nausea vomiting, diarrhea constipation.  Wishes to go home.  Objective:    Vitals:   05/19/18 1706 05/19/18 2047 05/20/18 0456 05/20/18 0817  BP: (!) 186/67 (!) 174/62 (!) 194/60 (!) 198/63  Pulse: (!) 57 (!) 53 61 63  Resp: 16 16 17 18   Temp: 98.2 F (36.8 C) 98.3 F (36.8 C) 98.5 F (36.9 C) (!) 97.5 F (36.4 C)  TempSrc: Oral   Oral  SpO2: 100% 98% 99% 97%  Weight:      Height:        Intake/Output Summary (Last 24 hours) at 05/20/2018 1427 Last data filed at 05/20/2018 0900 Gross per 24 hour  Intake 1648.96 ml  Output 1325 ml  Net 323.96 ml   Filed Weights   05/14/18 1903 05/15/18 0207  Weight: 48.3 kg 45.3 kg    Exam  General: Well developed, well nourished, elderly, NAD  HEENT: NCAT, mucous membranes moist.   Neck: Supple  Cardiovascular: S1 S2 auscultated, RRR  Respiratory: Clear to auscultation bilaterally with equal chest rise  Abdomen: Soft, nontender, nondistended, + bowel sounds  Extremities: warm dry without cyanosis clubbing or edema. RUE in sling  Neuro: AAOx2 (self, place), nonfocal, dementia at baseline   Psych: Appropriate mood and affect   Data Reviewed: I have personally reviewed following labs and imaging studies  CBC: Recent Labs  Lab 05/14/18 1929 05/15/18 0705  05/17/18 6712 05/18/18 0543 05/19/18 0649 05/20/18 0535 05/20/18 0848  WBC 26.0* 18.3*   < > 14.7* 13.3* 11.2* 17.5* 18.5*  NEUTROABS 23.7* 14.3*  --   --   --   --   --   --   HGB 14.3 12.6   < > 10.2* 9.3* 9.2* 10.8* 10.9*  HCT 39.9 35.0*   < > 29.3* 26.9* 26.3* 31.2* 30.9*  MCV 92.1 92.8   < > 94.8 94.7 94.3 93.7 93.4  PLT 198 189   < > 140* 135* 128* 103* 206   < > = values in this interval not displayed.   Basic Metabolic Panel: Recent Labs  Lab 05/16/18 0631  05/17/18 4580 05/18/18 0543 05/19/18 0649 05/19/18 1403 05/20/18 0535 05/20/18 0848 05/20/18 1316  NA 131*  --  123* 127* 132*  --  124* 123* 115*  K 2.9*   < > 5.8* 3.9 2.4* 3.8 6.2* 4.8  --   CL 108  --  94* 102 114*  --  100 97*  --   CO2 17*  --  19* 21* 14*   --  17* 20*  --   GLUCOSE 80  --  101* 104* 77  --  97 98  --   BUN 10  --  16 11 8   --  8 9  --   CREATININE 0.87  --  1.36* 1.07* 0.59  --  0.89 0.88  --   CALCIUM 6.0*  --  8.5* 7.0* 4.9*  --  8.4* 8.2*  --   MG 1.1*  --   --   --   --   --  1.5* 1.5*  --    < > = values in this interval not displayed.   GFR: Estimated Creatinine Clearance: 31.6 mL/min (by C-G formula based on SCr of 0.88 mg/dL). Liver Function Tests: Recent Labs  Lab 05/15/18 0705 05/16/18 0631  AST 29  --   ALT 22  --   ALKPHOS 69  --  BILITOT 1.3*  --   PROT 5.1*  --   ALBUMIN 3.2* 2.0*   No results for input(s): LIPASE, AMYLASE in the last 168 hours. No results for input(s): AMMONIA in the last 168 hours. Coagulation Profile: No results for input(s): INR, PROTIME in the last 168 hours. Cardiac Enzymes: No results for input(s): CKTOTAL, CKMB, CKMBINDEX, TROPONINI in the last 168 hours. BNP (last 3 results) No results for input(s): PROBNP in the last 8760 hours. HbA1C: No results for input(s): HGBA1C in the last 72 hours. CBG: Recent Labs  Lab 05/16/18 0730  GLUCAP 105*   Lipid Profile: No results for input(s): CHOL, HDL, LDLCALC, TRIG, CHOLHDL, LDLDIRECT in the last 72 hours. Thyroid Function Tests: No results for input(s): TSH, T4TOTAL, FREET4, T3FREE, THYROIDAB in the last 72 hours. Anemia Panel: No results for input(s): VITAMINB12, FOLATE, FERRITIN, TIBC, IRON, RETICCTPCT in the last 72 hours. Urine analysis:    Component Value Date/Time   COLORURINE YELLOW 05/14/2018 1910   APPEARANCEUR HAZY (A) 05/14/2018 1910   APPEARANCEUR Hazy 11/19/2012 1750   LABSPEC 1.011 05/14/2018 1910   LABSPEC 1.014 11/19/2012 1750   PHURINE 6.0 05/14/2018 1910   GLUCOSEU NEGATIVE 05/14/2018 1910   GLUCOSEU Negative 11/19/2012 1750   HGBUR MODERATE (A) 05/14/2018 1910   BILIRUBINUR NEGATIVE 05/14/2018 1910   BILIRUBINUR negative 05/12/2015 1823   BILIRUBINUR Negative 11/19/2012 1750   KETONESUR NEGATIVE  05/14/2018 1910   PROTEINUR 30 (A) 05/14/2018 1910   UROBILINOGEN 0.2 05/13/2015 0504   NITRITE NEGATIVE 05/14/2018 1910   LEUKOCYTESUR LARGE (A) 05/14/2018 1910   LEUKOCYTESUR 2+ 11/19/2012 1750   Sepsis Labs: @LABRCNTIP (procalcitonin:4,lacticidven:4)  ) Recent Results (from the past 240 hour(s))  Urine culture     Status: Abnormal   Collection Time: 05/14/18  9:06 PM  Result Value Ref Range Status   Specimen Description URINE, RANDOM  Final   Special Requests   Final    NONE Performed at [facility], [place] [address], [place], [place] [address]    Culture >=100,000 COLONIES/mL ESCHERICHIA COLI (A)  Final   Report Status 05/17/2018 FINAL  Final   Organism ID, Bacteria ESCHERICHIA COLI (A)  Final      Susceptibility   Escherichia coli - MIC*    AMPICILLIN <=2 SENSITIVE Sensitive     CEFAZOLIN <=4 SENSITIVE Sensitive     CEFTRIAXONE <=1 SENSITIVE Sensitive     CIPROFLOXACIN <=0.25 SENSITIVE Sensitive     GENTAMICIN <=1 SENSITIVE Sensitive     IMIPENEM <=0.25 SENSITIVE Sensitive     NITROFURANTOIN <=16 SENSITIVE Sensitive     TRIMETH/SULFA <=20 SENSITIVE Sensitive     AMPICILLIN/SULBACTAM <=2 SENSITIVE Sensitive     PIP/TAZO <=4 SENSITIVE Sensitive     Extended ESBL NEGATIVE Sensitive     * >=100,000 COLONIES/mL ESCHERICHIA COLI  MRSA PCR Screening     Status: None   Collection Time: 05/15/18  8:13 AM  Result Value Ref Range Status   MRSA by PCR NEGATIVE NEGATIVE Final    Comment:        The GeneXpert MRSA Assay (FDA approved for NASAL specimens only), is one component of a comprehensive MRSA colonization surveillance program. It is not intended to diagnose MRSA infection nor to guide or monitor treatment for MRSA infections. Performed at [facility], [place] [address] [place], [place] [address]       Radiology Studies: No results found.   Scheduled Meds: . atorvastatin  20 mg Oral q1800  . calcium-vitamin D  1 tablet Oral Q breakfast  .  carvedilol  6.25 mg Oral BID WC  . cholecalciferol  1,000 Units Oral QHS  . ciprofloxacin  250 mg Oral BID  . eltrombopag  50 mg Oral Q0600  . furosemide  20 mg Intravenous Once  . lactobacillus acidophilus  2 tablet Oral TID  . multivitamin  1 tablet Oral Daily  . spironolactone  25 mg Oral Daily   Continuous Infusions:   LOS: 6 days   Time Spent in minutes   45 minutes  Khalel Alms D.O. on 05/20/2018 at 2:27 PM  Between 7am to 7pm - Please see pager noted on [email or web address]  After 7pm go to [email or web address]  And look for the night coverage person covering for me after hours  [facility] Office  [phone]

## 2018-05-20 NOTE — Discharge Summary (Signed)
Physician Discharge Summary  Lisa James QQP:[id number] DOB: Jun 03, 1928 DOA: 05/14/2018  PCP: Merrilee Seashore, MD  Admit date: 05/14/2018 Discharge date: 05/26/2018  Time spent: 45 minutes  Recommendations for Outpatient Follow-up:  Patient will be discharged to skilled nursing facility, continue physical and occupational therapy.  Patient will need to follow up with primary care provider within one week of discharge, repeat CBC and BMP.  Folllow up with oncology, Dr. Burr Medico. Follow up with orthopedics, Dr. Marlou Sa, in one week.  Patient should continue medications as prescribed.  Patient should follow a heart healthy/carb modified diet.   Discharge Diagnoses:  Hyponatremia Leukocytosis Fall with displaced transverse fracture through the right humeral neck Acute metabolic encephalopathy in the setting of dementia Ecoli UTI Essential Hypertension Hypokalemia/Hypocalcemia  Hypomagnesemia History of ITP CAD Chronic kidney disease, stage III Pressure injury of skin Anemia with thrombocytopenia   Discharge Condition: Stable   Diet recommendation: heart healthy/carb mod  Filed Weights   05/25/18 0354 05/25/18 2143 05/26/18 0500  Weight: 53.6 kg 53.6 kg 53.6 kg    History of present illness:  On 05/14/2018 by Dr. Forde Dandy Lisa James is a 84 y.o. female with medical history significant of dementia, coronary artery disease, hypertension, non-Hodgkin's lymphoma, hypertension who was recently moved into a skilled nursing facility about 2-1/2 weeks ago.  Patient has been unhappy with the move.  She was in there today when she had  unwitnessed fall.  Patient fell to her right side and sustained injury to her right upper extremity.  She was also noted to be more confused than usual.  Patient was brought to the ER where she was seen and evaluated and found to have UTI as well as right comminuted humeral shaft fracture.  She is complaining of some pain about 6 out of 10 in the right  upper extremity.  She is not a good historian and most history is from family.  She has non-Hodgkin's lymphoma and has been on treatment with [facility] which was supposed to have been started today after previous injections.  Patient was previously at home but has declining health.  Family with the patient in the room.  Orthopedics has been consulted regarding her humeral fracture.  She is being admitted for work-up.  Hospital Course:  Hyponatremia -On admission sodium was 120 however improved to 132 as of 05/19/2018.  However today patient's sodium level was noted to be down to 115 despite receiving IV fluids. -Discontinued IV fluids -?  Hypovolemic hyponatremia however patient does not appear to be volume overloaded, vs "Tea and toast" -Monitor intake and output, daily weights -Currently mental status does appear to be at baseline, no neurological deficits -given IV lasix -On 10/31: Discussed with Dr. Jimmy Footman, nephrologist, who recommended waiting and watching as well as place patient on fluid restriction -Discussed with Dr. Augustin Coupe, nephro, recommended starting low dose sodium tab for one week -sodium improved today to 126 (patient asymptomatic and neurologically intact) -Continue sodium tablets to TID for additional 3 days and recheck sodium -will also discharge patient with lasix 20mg  PO daily for 1 week -Repeat BMP   Leukocytosis -Upon admission, WBC 26, however had trended down, the back up to 18.5 -WBC today 15.4 -Patient does not appear to have further infective etiology, currently being treated for urinary tract infection -Repeat UA appears improved, urine culture showed no growth.  Blood cultures appear to be negative to date. -Currently afebrile with no complaints of upper respiratory symptoms -Discussed with Dr. Burr Medico, recommended not doing  further workup, likely inflammatory  Fall with displaced transverse fracture through the right humeral neck -CT and x-ray of the right  shoulder show displaced transverse fracturethrough the humeral neck -Orthopedic surgery consulted and appreciated, status post closed reduction under conscious sedation -Orthopedics recommending sling on at all times with outpatient follow-up in 7 days  Acute metabolic encephalopathy in the setting of dementia -Appears to be at baseline, mental status improved -Suspect worsened by UTI -Continue fall precautions -Per family request, Aricept and Namenda have been discontinued as they do not feel it is been helping  Ecoli UTI -Urine culture showed pansensitive E. Coli -Patient was placed on Rocephin and transitioned to oral ciprofloxacin- completed course while hospitalized -Given leukocytosis, repeat urine culture on 05/20/2018 showed no growth  Essential Hypertension -Stable -Continue Coreg -Aldactone and irbesartan discontinued -Should discuss blood pressure management with PCP -will place on lasix -monitor BMP  Hypokalemia/Hypocalcemia  -resolved  Hypomagnesemia -replaced, currently 1.9  History of ITP -Patient follows with Dr. Burr Medico, oncology and was last seen on 04/23/2018 -Placed on Promacta to maintain platelet counts above 50,000 -She is to follow-up with oncology as an outpatient  CAD -Stable, no complaints of chest pain or shortness of breath -Continue Coreg, statin  Chronic kidney disease, stage III -Stable, creatinine 1.1   Pressure injury of skin -Stage II, sacral -Continue wound care  Anemia with thrombocytopenia  -No obvious bleeding -patient is currently on Promacta due to history of ITP -hemoglobin currently 10.7 -platelets 126  Procedures: Closedreduction of right humeral fracture  Consultations: Orthopedic surgery Oncology, Dr. Burr Medico, via phone Nephrology, Dr. Jimmy Footman and Dr. Augustin Coupe, via phone  Discharge Exam: Vitals:   05/26/18 0632 05/26/18 0840  BP: (!) 153/82 (!) 144/71  Pulse: 61 (!) 59  Resp: 18 18  Temp: 98 F (36.7 C)  97.7 F (36.5 C)  SpO2: 97% 99%   No complaints today.  Denies current chest pain, shortness breath, abdominal pain, nausea vomiting, diarrhea constipation.    General: Well developed, well nourished, elderly, NAD, appears stated age  12: NCAT, mucous membranes moist.  Neck: Supple  Cardiovascular: S1 S2 auscultated, RRR, +SEM  Respiratory: Clear to auscultation bilaterally with equal chest rise  Abdomen: Soft, nontender, nondistended, + bowel sounds  Extremities: warm dry without cyanosis clubbing or edema. RUE in sling  Neuro: AAOx1 (self), nonfocal (history of dementia)  Psych: Normal affect and demeanor, pleasant   Discharge Instructions Discharge Instructions    Discharge instructions   Complete by:  As directed    Patient will be discharged to skilled nursing facility, continue physical and occupational therapy.  Patient will need to follow up with primary care provider within one week of discharge, repeat CBC and BMP.  Folllow up with oncology, Dr. Burr Medico. Follow up with orthopedics, Dr. Marlou Sa, in one week.  Patient should continue medications as prescribed.  Patient should follow a heart healthy/carb modified diet.     Allergies as of 05/26/2018      Reactions   Ace Inhibitors Cough      Medication List    STOP taking these medications   irbesartan 300 MG tablet Commonly known as:  AVAPRO   KLOR-CON M20 20 MEQ tablet Generic drug:  potassium chloride SA   NAMZARIC 28-10 MG Cp24 Generic drug:  Memantine HCl-Donepezil HCl   sertraline 25 MG tablet Commonly known as:  ZOLOFT   spironolactone 25 MG tablet Commonly known as:  ALDACTONE     TAKE these medications   atorvastatin 20 MG  tablet Commonly known as:  LIPITOR TAKE 1 TABLET (20 MG TOTAL) BY MOUTH DAILY. What changed:  See the new instructions.   calcium-vitamin D 500-200 MG-UNIT tablet Take 1 tablet by mouth daily.   carvedilol 6.25 MG tablet Commonly known as:  COREG Take 1 tablet (6.25 mg  total) by mouth 2 (two) times daily with a meal.   eltrombopag 50 MG tablet Commonly known as:  PROMACTA Take 1 tablet (50 mg total) by mouth daily at 6 (six) AM. Take on an empty stomach 1 hr before or 2 hrs after food, separate from vitamins   lactobacillus acidophilus Tabs tablet Take 2 tablets by mouth 3 (three) times daily.   PRESERVISION AREDS PO Take 1 tablet by mouth daily.   traZODone 50 MG tablet Commonly known as:  DESYREL Take 50-100 mg by mouth at bedtime as needed for sleep.   Vitamin D 1000 units capsule Take 1,000 Units by mouth at bedtime.      Allergies  Allergen Reactions  . Ace Inhibitors Cough    Contact information for follow-up providers    Marlou Sa, Tonna Corner, MD. Schedule an appointment as soon as possible for a visit in 1 week(s).   Specialty:  Orthopedic Surgery Why:  Hospital follow up, repeat Xray Contact information: [place] [address] [phone]        Truitt Merle, MD. Schedule an appointment as soon as possible for a visit in 1 week(s).   Specialties:  Hematology, Oncology Why:  Hospital follow up Contact information: [place] [address] [phone]        Merrilee Seashore, MD. Schedule an appointment as soon as possible for a visit in 1 week(s).   Specialty:  Internal Medicine Why:  Hospital follow up, low sodium, hypertension Contact information: [address] [place] [address] [phone]            Contact information for after-discharge care    Destination    [facility] Preferred SNF .   Service:  Skilled Nursing Contact information: [place] [phone]                   The results of significant diagnostics from this hospitalization (including imaging, microbiology, ancillary and laboratory) are listed below for reference.    Significant Diagnostic  Studies: Dg Chest 2 View  Result Date: 05/14/2018 CLINICAL DATA:  Fall, shoulder pain EXAM: CHEST - 2 VIEW COMPARISON:  05/12/2015 FINDINGS: Prior CABG. Mild cardiomegaly. Lungs clear. No effusions or pneumothorax. Severely displaced right humeral neck fracture noted as seen on today's right shoulder series. IMPRESSION: No acute cardiopulmonary disease. Displaced right humeral neck fracture. Electronically Signed   By: Rolm Baptise M.D.   On: 05/14/2018 20:22   Dg Shoulder Right  Result Date: 05/14/2018 CLINICAL DATA:  Fall, shoulder pain EXAM: RIGHT SHOULDER - 2+ VIEW COMPARISON:  None. FINDINGS: There Is a right humeral neck fracture. The humeral shaft is displaced medially relative to the humeral head which appears located in the glenohumeral fossa. Degenerative changes in the right AC and glenohumeral joints. IMPRESSION: Severely displaced right humeral neck fracture. Electronically Signed   By: Rolm Baptise M.D.   On: 05/14/2018 20:21   Ct Head Wo Contrast  Result Date: 05/14/2018 CLINICAL DATA:  84 year old female with fall, head and neck injury. EXAM: CT HEAD WITHOUT CONTRAST CT CERVICAL SPINE WITHOUT CONTRAST TECHNIQUE: Multidetector CT imaging of the head  and cervical spine was performed following the standard protocol without intravenous contrast. Multiplanar CT image reconstructions of the cervical spine were also generated. COMPARISON:  08/22/2015 CT and prior studies FINDINGS: CT HEAD FINDINGS Brain: No acute infarction, hemorrhage, hydrocephalus or significant change noted from the prior study. A large chronic calcified LEFT frontoparietal subdural collection is again identified measuring up to 2.6 cm in maximal diameter causing 6 mm LEFT to RIGHT midline shift-unchanged. Atrophy and chronic small-vessel white matter ischemic changes are again noted. Vascular: Atherosclerotic calcifications again noted. Skull: Normal. Negative for fracture or focal lesion. Sinuses/Orbits: No acute  finding. Other: None. CT CERVICAL SPINE FINDINGS Alignment: Normal. Skull base and vertebrae: No acute fracture. No primary bone lesion or focal pathologic process. Soft tissues and spinal canal: No prevertebral fluid or swelling. No visible canal hematoma. Disc levels: Moderate multilevel degenerative disc disease and spondylosis again noted. Mild multilevel facet arthropathy again identified. Upper chest: No acute abnormality Other: None IMPRESSION: 1. No evidence of acute intracranial abnormality. 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 2. No static evidence of acute injury to the cervical spine. Moderate multilevel degenerative changes 3. Cerebral atrophy and chronic small-vessel white matter ischemic changes. Electronically Signed   By: Margarette Canada M.D.   On: 05/14/2018 20:03   Ct Cervical Spine Wo Contrast  Result Date: 05/14/2018 CLINICAL DATA:  84 year old female with fall, head and neck injury. EXAM: CT HEAD WITHOUT CONTRAST CT CERVICAL SPINE WITHOUT CONTRAST TECHNIQUE: Multidetector CT imaging of the head and cervical spine was performed following the standard protocol without intravenous contrast. Multiplanar CT image reconstructions of the cervical spine were also generated. COMPARISON:  08/22/2015 CT and prior studies FINDINGS: CT HEAD FINDINGS Brain: No acute infarction, hemorrhage, hydrocephalus or significant change noted from the prior study. A large chronic calcified LEFT frontoparietal subdural collection is again identified measuring up to 2.6 cm in maximal diameter causing 6 mm LEFT to RIGHT midline shift-unchanged. Atrophy and chronic small-vessel white matter ischemic changes are again noted. Vascular: Atherosclerotic calcifications again noted. Skull: Normal. Negative for fracture or focal lesion. Sinuses/Orbits: No acute finding. Other: None. CT CERVICAL SPINE FINDINGS Alignment: Normal. Skull base and  vertebrae: No acute fracture. No primary bone lesion or focal pathologic process. Soft tissues and spinal canal: No prevertebral fluid or swelling. No visible canal hematoma. Disc levels: Moderate multilevel degenerative disc disease and spondylosis again noted. Mild multilevel facet arthropathy again identified. Upper chest: No acute abnormality Other: None IMPRESSION: 1. No evidence of acute intracranial abnormality. 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 2. No static evidence of acute injury to the cervical spine. Moderate multilevel degenerative changes 3. Cerebral atrophy and chronic small-vessel white matter ischemic changes. Electronically Signed   By: Margarette Canada M.D.   On: 05/14/2018 20:03   Ct Shoulder Right Wo Contrast  Result Date: 05/14/2018 CLINICAL DATA:  [address].  Right shoulder fracture. EXAM: CT OF THE UPPER RIGHT EXTREMITY WITHOUT CONTRAST TECHNIQUE: Multidetector CT imaging of the upper right extremity was performed according to the standard protocol. COMPARISON:  Radiographs 05/14/2018 FINDINGS: There is a transverse fracture through the humeral neck with medial displacement of the humeral shaft. The humeral shaft is displaced approximately 3 cm and is adjacent to the humeral head and lower aspect of the glenoid. The humeral head is articulating with the glenoid. No vertical fracture through the greater tuberosity.  No glenoid fracture. The [facility] joint is intact. The visualized right ribs are intact and the visualized right lung is clear. Atherosclerotic calcifications involving the ascending aorta and surgical changes from bypass surgery. IMPRESSION: 1. Displaced transverse fracture through the humeral neck as detailed above. Please see 3D images. 2. The other bony structures are intact. Electronically Signed   By: Marijo Sanes M.D.   On: 05/14/2018 21:57   Dg Humerus Right  Result Date:  05/15/2018 CLINICAL DATA:  Postreduction EXAM: RIGHT HUMERUS - 2+ VIEW COMPARISON:  05/14/2018 FINDINGS: Interval reduction of the displaced humeral neck fracture with improved alignment on this single AP view. No subluxation or dislocation. IMPRESSION: Improved alignment postreduction on this single view. Electronically Signed   By: Rolm Baptise M.D.   On: 05/15/2018 00:58    Microbiology: Recent Results (from the past 240 hour(s))  Culture, blood (routine x 2)     Status: None (Preliminary result)   Collection Time: 05/20/18  1:07 PM  Result Value Ref Range Status   Specimen Description BLOOD LEFT ANTECUBITAL  Final   Special Requests   Final    BOTTLES DRAWN AEROBIC ONLY Blood Culture adequate volume   Culture   Final    NO GROWTH 4 DAYS Performed at [facility], [address] [place], [place] [address]    Report Status PENDING  Incomplete  Culture, blood (routine x 2)     Status: None (Preliminary result)   Collection Time: 05/20/18  1:17 PM  Result Value Ref Range Status   Specimen Description BLOOD LEFT HAND  Final   Special Requests   Final    BOTTLES DRAWN AEROBIC ONLY Blood Culture adequate volume   Culture   Final    NO GROWTH 4 DAYS Performed at [facility], [place] [address] [place], [place] [address]    Report Status PENDING  Incomplete  Culture, Urine     Status: None   Collection Time: 05/20/18  4:54 PM  Result Value Ref Range Status   Specimen Description URINE, CLEAN CATCH  Final   Special Requests NONE  Final   Culture   Final    NO GROWTH Performed at [facility], [place] [address] [place], [place] [address]    Report Status 05/21/2018 FINAL  Final     Labs: Basic Metabolic Panel: Recent Labs  Lab 05/20/18 0535 05/20/18 0848  05/21/18 0253  05/22/18 0704 05/23/18 0657 05/24/18 0541 05/25/18 0635 05/26/18 0534  NA 124* 123*   < > 121*   < > 117* 117* 118* 122* 126*  K 6.2* 4.8   < > 4.9   < > 5.0 4.7 4.5 4.3 4.6  CL 100 97*    < > 95*   < > 91* 89* 90* 93* 94*  CO2 17* 20*   < > 21*   < > 24 23 24 26 27   GLUCOSE 97 98   < > 103*   < > 106* 108* 115* 114* 100*  BUN 8 9   < > 10   < > 12 16 17 23 23   CREATININE 0.89 0.88   < > 0.99   < > 1.20* 1.16* 1.15* 1.10* 1.10*  CALCIUM 8.4* 8.2*   < > 8.2*   < > 8.3* 8.3* 8.3* 8.4* 8.6*  MG 1.5* 1.5*  --  2.2  --   --   --  1.8  --  1.9   < > = values  in this interval not displayed.   Liver Function Tests: No results for input(s): AST, ALT, ALKPHOS, BILITOT, PROT, ALBUMIN in the last 168 hours. No results for input(s): LIPASE, AMYLASE in the last 168 hours. No results for input(s): AMMONIA in the last 168 hours. CBC: Recent Labs  Lab 05/20/18 0848 05/20/18 1316 05/21/18 0601 05/22/18 0704 05/23/18 0657  WBC 18.5* 14.5* 15.7* 14.1* 15.4*  NEUTROABS  --  11.0* 11.1*  --   --   HGB 10.9* 9.8* 10.5* 10.3* 10.7*  HCT 30.9* 29.5* 30.7* 29.0* 30.3*  MCV 93.4 96.7 95.6 93.5 94.7  PLT 206 PLATELET CLUMPS NOTED ON SMEAR, COUNT APPEARS ADEQUATE 146* 136* 126*   Cardiac Enzymes: No results for input(s): CKTOTAL, CKMB, CKMBINDEX, TROPONINI in the last 168 hours. BNP: BNP (last 3 results) No results for input(s): BNP in the last 8760 hours.  ProBNP (last 3 results) No results for input(s): PROBNP in the last 8760 hours.  CBG: No results for input(s): GLUCAP in the last 168 hours.     Signed:  Cristal Ford  [facility] 05/26/2018, 9:45 AM

## 2018-05-20 NOTE — Progress Notes (Signed)
Occupational Therapy Treatment Patient Details Name: Lisa James MRN: [id number] DOB: February 11, 1928 Today's Date: 05/20/2018    History of present illness Pt is an 84 y/o female admitted from ALF following fall. Unsure of cause, however, imaging revealed R humeral neck fx. Pt is s/p closed reduction. PMH includes MI, dementia, CKD, CAD s/p CABG, CHF, DM, HTN, and subdural hematoma.    OT comments  Pt presents seated in recliner pleasant and willing to participate in therapy session. Pt performing hand/wrist ROM to RUE for edema management; seated LE exercise for continued strengthening as prep for ADL/mobility completion. Pt completed seated grooming ADLs with setup/minguard assist. Pt continues to require up to maxA for UB ADL including sling management. Feel POC remains appropriate at this time. Will continue to follow acutely.    Follow Up Recommendations  SNF;Supervision/Assistance - 24 hour    Equipment Recommendations  Other (comment)(TBD in next venue)          Precautions / Restrictions Precautions Precautions: Fall Required Braces or Orthoses: Sling(at all times ) Restrictions Weight Bearing Restrictions: Yes RUE Weight Bearing: Non weight bearing Other Position/Activity Restrictions: Assumed NWB RUE       Mobility Bed Mobility               General bed mobility comments: OOB in recliner upon arrival  Transfers                 General transfer comment: pt comfortable seated in recliner and declined further mobility this session, stating "I dont' want to"    Balance                                           ADL either performed or assessed with clinical judgement   ADL Overall ADL's : Needs assistance/impaired     Grooming: Set up;Sitting;Wash/dry face Grooming Details (indicate cue type and reason): seated in recliner           Upper Body Dressing Details (indicate cue type and reason): repositioned sling for comfot                                            Cognition Arousal/Alertness: Awake/alert Behavior During Therapy: WFL for tasks assessed/performed Overall Cognitive Status: History of cognitive impairments - at baseline                                 General Comments: Dementia at baseline. Able to follow 1 step commands consistently        Exercises Exercises: Hand exercises;General Lower Extremity General Exercises - Lower Extremity Ankle Circles/Pumps: AAROM;Both;10 reps;Seated Straight Leg Raises: AROM;10 reps;Seated;Both Hand Exercises Wrist Flexion: AROM;10 reps;Right;Seated Wrist Extension: AROM;10 reps;Right;Seated Digit Composite Flexion: AROM;20 reps;Right;Seated(2 sets of 10) Composite Extension: AROM;20 reps;Right;Seated(2 sets of 10)   Shoulder Instructions       General Comments      Pertinent Vitals/ Pain       Pain Assessment: Faces Faces Pain Scale: Hurts little more Pain Location: R shoulder Pain Descriptors / Indicators: Guarding;Sore Pain Intervention(s): Monitored during session;Repositioned  Home Living  Prior Functioning/Environment              Frequency  Min 3X/week        Progress Toward Goals  OT Goals(current goals can now be found in the care plan section)  Progress towards OT goals: Progressing toward goals  Acute Rehab OT Goals Patient Stated Goal: to go home today OT Goal Formulation: With patient Time For Goal Achievement: 05/31/18 Potential to Achieve Goals: Fair ADL Goals Pt Will Perform Grooming: standing;with min guard assist Pt Will Perform Upper Body Bathing: with min assist;sitting Pt Will Perform Upper Body Dressing: with mod assist;sitting Pt Will Transfer to Toilet: with min assist;bedside commode;stand pivot transfer Pt/caregiver will Perform Home Exercise Program: Increased ROM;Right Upper extremity  Plan Discharge plan  remains appropriate    Co-evaluation                 AM-PAC PT "6 Clicks" Daily Activity     Outcome Measure   Help from another person eating meals?: A Little Help from another person taking care of personal grooming?: A Little Help from another person toileting, which includes using toliet, bedpan, or urinal?: Total Help from another person bathing (including washing, rinsing, drying)?: A Lot Help from another person to put on and taking off regular upper body clothing?: Total Help from another person to put on and taking off regular lower body clothing?: Total 6 Click Score: 11    End of Session Equipment Utilized During Treatment: Other (comment)(sling)  OT Visit Diagnosis: Unsteadiness on feet (R26.81);Other abnormalities of gait and mobility (R26.89);Muscle weakness (generalized) (M62.81);Pain Pain - Right/Left: Right Pain - part of body: Arm;Shoulder   Activity Tolerance Patient tolerated treatment well   Patient Left in chair;with call bell/phone within reach;with chair alarm set   Nurse Communication Mobility status        Time: 5997-7414 OT Time Calculation (min): 13 min  Charges: OT General Charges $OT Visit: 1 Visit OT Treatments $Therapeutic Activity: 8-22 mins  Lou Cal, OT [facility] Pager [phone] Office [phone]    Raymondo Band 05/20/2018, 4:22 PM

## 2018-05-21 LAB — CBC WITH DIFFERENTIAL/PLATELET
Abs Immature Granulocytes: 0.34 10*3/uL — ABNORMAL HIGH (ref 0.00–0.07)
BASOS ABS: 0 10*3/uL (ref 0.0–0.1)
Basophils Relative: 0 %
EOS ABS: 0.5 10*3/uL (ref 0.0–0.5)
EOS PCT: 3 %
HEMATOCRIT: 30.7 % — AB (ref 36.0–46.0)
Hemoglobin: 10.5 g/dL — ABNORMAL LOW (ref 12.0–15.0)
IMMATURE GRANULOCYTES: 2 %
LYMPHS ABS: 2.1 10*3/uL (ref 0.7–4.0)
LYMPHS PCT: 14 %
MCH: 32.7 pg (ref 26.0–34.0)
MCHC: 34.2 g/dL (ref 30.0–36.0)
MCV: 95.6 fL (ref 80.0–100.0)
Monocytes Absolute: 1.6 10*3/uL — ABNORMAL HIGH (ref 0.1–1.0)
Monocytes Relative: 10 %
NEUTROS PCT: 71 %
Neutro Abs: 11.1 10*3/uL — ABNORMAL HIGH (ref 1.7–7.7)
PLATELETS: 146 10*3/uL — AB (ref 150–400)
RBC: 3.21 MIL/uL — ABNORMAL LOW (ref 3.87–5.11)
RDW: 13.4 % (ref 11.5–15.5)
WBC: 15.7 10*3/uL — AB (ref 4.0–10.5)
nRBC: 0 % (ref 0.0–0.2)

## 2018-05-21 LAB — BASIC METABOLIC PANEL
ANION GAP: 5 (ref 5–15)
ANION GAP: 3 — AB (ref 5–15)
ANION GAP: 4 — AB (ref 5–15)
BUN: 10 mg/dL (ref 8–23)
BUN: 10 mg/dL (ref 8–23)
BUN: 11 mg/dL (ref 8–23)
CALCIUM: 8.3 mg/dL — AB (ref 8.9–10.3)
CALCIUM: 8.4 mg/dL — AB (ref 8.9–10.3)
CHLORIDE: 96 mmol/L — AB (ref 98–111)
CO2: 21 mmol/L — AB (ref 22–32)
CO2: 22 mmol/L (ref 22–32)
CO2: 25 mmol/L (ref 22–32)
CREATININE: 1.09 mg/dL — AB (ref 0.44–1.00)
CREATININE: 1.16 mg/dL — AB (ref 0.44–1.00)
Calcium: 8.2 mg/dL — ABNORMAL LOW (ref 8.9–10.3)
Chloride: 95 mmol/L — ABNORMAL LOW (ref 98–111)
Chloride: 92 mmol/L — ABNORMAL LOW (ref 98–111)
Creatinine, Ser: 0.99 mg/dL (ref 0.44–1.00)
GFR calc non Af Amer: 49 mL/min — ABNORMAL LOW (ref >60)
GFR calc non Af Amer: 44 mL/min — ABNORMAL LOW (ref >60)
GFR, EST AFRICAN AMERICAN: 57 mL/min — AB (ref >60)
GFR, EST AFRICAN AMERICAN: 51 mL/min — AB (ref >60)
GFR, EST AFRICAN AMERICAN: 47 mL/min — AB (ref >60)
GFR, EST NON AFRICAN AMERICAN: 41 mL/min — AB (ref >60)
GLUCOSE: 103 mg/dL — AB (ref 70–99)
Glucose, Bld: 97 mg/dL (ref 70–99)
Glucose, Bld: 132 mg/dL — ABNORMAL HIGH (ref 70–99)
POTASSIUM: 4.9 mmol/L (ref 3.5–5.1)
Potassium: 4.8 mmol/L (ref 3.5–5.1)
Potassium: 4.9 mmol/L (ref 3.5–5.1)
SODIUM: 121 mmol/L — AB (ref 135–145)
Sodium: 121 mmol/L — ABNORMAL LOW (ref 135–145)
Sodium: 121 mmol/L — ABNORMAL LOW (ref 135–145)

## 2018-05-21 LAB — URINE CULTURE: Culture: NO GROWTH

## 2018-05-21 LAB — MAGNESIUM: Magnesium: 2.2 mg/dL (ref 1.7–2.4)

## 2018-05-21 MED ORDER — FUROSEMIDE 10 MG/ML IJ SOLN: 20.0000 mg | Freq: Once | INTRAMUSCULAR | Status: DC

## 2018-05-21 MED ORDER — FUROSEMIDE 10 MG/ML IJ SOLN
20.0000 mg | Freq: Once | INTRAMUSCULAR | Status: AC
  Administered 2018-05-21: 20 mg via INTRAVENOUS
  Filled 2018-05-21: qty 2

## 2018-05-21 NOTE — Progress Notes (Addendum)
PROGRESS NOTE    Lisa James  IHK:[id number] DOB: [birth year] DOA: 05/14/2018 PCP: Merrilee Seashore, MD   Brief Narrative:  Admitted for right humeral neck fracture and underwent surgery by orthopedics.  Plan is for discharge to SNF however patients with WBC count rose today, and her sodium worsened. Assessment & Plan   Hyponatremia -On admission sodium was 120 however improved to 132 as of 05/19/2018.  However today patient's sodium level was noted to be down to 115 despite receiving IV fluids. -Discontinued IV fluids -?  Hypovolemic hyponatremia however patient does not appear to be volume overloaded, vs "Tea and toast" -Monitor intake and output, daily weights -Currently mental status does appear to be at baseline, no neurological deficits -given IV lasix -Discussed with Dr. Jimmy Footman, nephrologist, who recommended waiting and watching as well as place patient on fluid restriction -sodium currently 121 -continue to monitor BMP  Leukocytosis -Upon admission, WBC 26 -However this morning, WBC of 15.7  -Patient does not appear to have further infective etiology, currently being treated for urinary tract infection -Repeat UA appears improved, urine and blood cultures pending UA -Currently afebrile with no complaints of upper respiratory symptoms -Discussed with Dr. Burr Medico, recommended not doing further workup, likely inflammatory  Fall with displaced transverse fracture through the right humeral neck -CT and x-ray of the right shoulder show displaced transverse fracture through the humeral neck -Orthopedic surgery consulted and appreciated, status post closed reduction under conscious sedation -Orthopedics recommending sling on at all times with outpatient follow-up in 7 days  Acute metabolic encephalopathy in the setting of dementia -Appears to be at baseline, mental status improved -Suspect worsened by UTI -Continue fall precautions -Per family request, Aricept and  Namenda have been discontinued as I do not feel it is been helping  Ecoli UTI -Urine culture showed pansensitive E. Coli -Patient was placed on Rocephin and transitioned to oral ciprofloxacin  Essential Hypertension -Stable, continue Coreg, Aldactone  Hypokalemia/Hypocalcemia  -resolved, continue to monitor   Hypomagnesemia -replaced, currently 2.2  History of ITP -Patient follows with Dr. Burr Medico, oncology and was last seen on 04/23/2018 -Placed on Promacta to maintain platelet counts above 50,000 -She is to follow-up with oncology as an outpatient  CAD -Stable, no complaints of chest pain or shortness of breath -Continue Coreg, statin  Chronic kidney disease, stage III -Stable  Pressure injury of skin -Stage II, sacral -Continue wound care  Anemia with thrombocytopenia  -No obvious bleeding -patient is currently on Promacta due to history of ITP -hemoglobin currently 10.5 -platelets 146  DVT Prophylaxis  SCDs  Code Status: Full  Family Communication: None at bedside  Disposition Plan: Admitted. Plan to discharge to SNF when sodium level has improved  Procedures: Closed reduction of right humeral fracture  Consultations: Orthopedic surgery Oncology, Dr. Burr Medico, via phone Nephrology, Dr. Jimmy Footman, via phone  Antibiotics   Anti-infectives (From admission, onward)   Start     Dose/Rate Route Frequency Ordered Stop   05/18/18 1100  ciprofloxacin (CIPRO) tablet 250 mg     250 mg Oral 2 times daily 05/18/18 0939 05/23/18 0759   05/15/18 2200  cefTRIAXone (ROCEPHIN) 1 g in sodium chloride 0.9 % 100 mL IVPB  Status:  Discontinued     1 g 200 mL/hr over 30 Minutes Intravenous Every 24 hours 05/15/18 0156 05/18/18 0939   05/14/18 2200  cefTRIAXone (ROCEPHIN) 1 g in sodium chloride 0.9 % 100 mL IVPB     1 g 200 mL/hr over 30 Minutes Intravenous  Once 05/14/18 2157 05/14/18 2302      Subjective:   Lisa James seen and examined today.  Patient with  dementia. Has no complaints today.  Objective:   Vitals:   05/21/18 0500 05/21/18 0515 05/21/18 0807 05/21/18 0929  BP:  (!) 168/59 (!) 208/66 (!) 111/58  Pulse:  (!) 59 61 61  Resp:  16 18   Temp:  98.4 F (36.9 C) 97.8 F (36.6 C)   TempSrc:   Oral   SpO2:  98% 100% 96%  Weight: 53.6 kg     Height:        Intake/Output Summary (Last 24 hours) at 05/21/2018 1513 Last data filed at 05/21/2018 1500 Gross per 24 hour  Intake 721 ml  Output 1200 ml  Net -479 ml   Filed Weights   05/20/18 1900 05/20/18 2059 05/21/18 0500  Weight: 53.6 kg 53.6 kg 53.6 kg   Exam  General: Well developed, well nourished, elderly, NAD  HEENT: NCAT, mucous membranes moist.   Neck: Supple  Cardiovascular: S1 S2 auscultated, 2/6 SEM, RRR  Respiratory: Clear to auscultation bilaterally with equal chest rise  Abdomen: Soft, nontender, nondistended, + bowel sounds  Extremities: warm dry without cyanosis clubbing or edema. RUE in sling   Neuro: AAOx1 (self only), nonfocal  Psych: Appropriate mood and affect   Data Reviewed: I have personally reviewed following labs and imaging studies  CBC: Recent Labs  Lab 05/14/18 1929 05/15/18 0705  05/19/18 0649 05/20/18 0535 05/20/18 0848 05/20/18 1316 05/21/18 0601  WBC 26.0* 18.3*   < > 11.2* 17.5* 18.5* 14.5* 15.7*  NEUTROABS 23.7* 14.3*  --   --   --   --  11.0* 11.1*  HGB 14.3 12.6   < > 9.2* 10.8* 10.9* 9.8* 10.5*  HCT 39.9 35.0*   < > 26.3* 31.2* 30.9* 29.5* 30.7*  MCV 92.1 92.8   < > 94.3 93.7 93.4 96.7 95.6  PLT 198 189   < > 128* 103* 206 PLATELET CLUMPS NOTED ON SMEAR, COUNT APPEARS ADEQUATE 146*   < > = values in this interval not displayed.   Basic Metabolic Panel: Recent Labs  Lab 05/16/18 0631  05/20/18 0535 05/20/18 0848 05/20/18 1316 05/20/18 2014 05/21/18 0253 05/21/18 0601 05/21/18 1232  NA 131*   < > 124* 123* 115* 121* 121* 121* 121*  K 2.9*   < > 6.2* 4.8  --  4.7 4.9 4.8 4.9  CL 108   < > 100 97*  --  95*  95* 96* 92*  CO2 17*   < > 17* 20*  --  20* 21* 22 25  GLUCOSE 80   < > 97 98  --  197* 103* 97 132*  BUN 10   < > 8 9  --  10 10 10 11   CREATININE 0.87   < > 0.89 0.88  --  1.05* 0.99 1.09* 1.16*  CALCIUM 6.0*   < > 8.4* 8.2*  --  8.2* 8.2* 8.3* 8.4*  MG 1.1*  --  1.5* 1.5*  --   --  2.2  --   --    < > = values in this interval not displayed.   GFR: Estimated Creatinine Clearance: 25.3 mL/min (A) (by C-G formula based on SCr of 1.16 mg/dL (H)). Liver Function Tests: Recent Labs  Lab 05/15/18 0705 05/16/18 0631  AST 29  --   ALT 22  --   ALKPHOS 69  --   BILITOT 1.3*  --  PROT 5.1*  --   ALBUMIN 3.2* 2.0*   No results for input(s): LIPASE, AMYLASE in the last 168 hours. No results for input(s): AMMONIA in the last 168 hours. Coagulation Profile: No results for input(s): INR, PROTIME in the last 168 hours. Cardiac Enzymes: No results for input(s): CKTOTAL, CKMB, CKMBINDEX, TROPONINI in the last 168 hours. BNP (last 3 results) No results for input(s): PROBNP in the last 8760 hours. HbA1C: No results for input(s): HGBA1C in the last 72 hours. CBG: Recent Labs  Lab 05/16/18 0730  GLUCAP 105*   Lipid Profile: No results for input(s): CHOL, HDL, LDLCALC, TRIG, CHOLHDL, LDLDIRECT in the last 72 hours. Thyroid Function Tests: No results for input(s): TSH, T4TOTAL, FREET4, T3FREE, THYROIDAB in the last 72 hours. Anemia Panel: No results for input(s): VITAMINB12, FOLATE, FERRITIN, TIBC, IRON, RETICCTPCT in the last 72 hours. Urine analysis:    Component Value Date/Time   COLORURINE YELLOW 05/20/2018 [place] 05/20/2018 1418   APPEARANCEUR [place] 11/19/2012 1750   LABSPEC 1.011 05/20/2018 1418   LABSPEC 1.014 11/19/2012 1750   PHURINE 5.0 05/20/2018 1418   GLUCOSEU NEGATIVE 05/20/2018 1418   GLUCOSEU Negative 11/19/2012 1750   HGBUR SMALL (A) 05/20/2018 1418   BILIRUBINUR NEGATIVE 05/20/2018 1418   BILIRUBINUR negative 05/12/2015 1823   BILIRUBINUR  Negative 11/19/2012 [place] 05/20/2018 1418   PROTEINUR NEGATIVE 05/20/2018 1418   UROBILINOGEN 0.2 05/13/2015 0504   NITRITE NEGATIVE 05/20/2018 1418   LEUKOCYTESUR MODERATE (A) 05/20/2018 1418   LEUKOCYTESUR 2+ 11/19/2012 1750   Sepsis Labs: @LABRCNTIP (procalcitonin:4,lacticidven:4)  ) Recent Results (from the past 240 hour(s))  Urine culture     Status: Abnormal   Collection Time: 05/14/18  9:06 PM  Result Value Ref Range Status   Specimen Description URINE, RANDOM  Final   Special Requests   Final    NONE Performed at [facility], [place] [address] [place], [place] [address]    Culture >=100,000 COLONIES/mL ESCHERICHIA COLI (A)  Final   Report Status 05/17/2018 FINAL  Final   Organism ID, Bacteria ESCHERICHIA COLI (A)  Final      Susceptibility   Escherichia coli - MIC*    AMPICILLIN <=2 SENSITIVE Sensitive     CEFAZOLIN <=4 SENSITIVE Sensitive     CEFTRIAXONE <=1 SENSITIVE Sensitive     CIPROFLOXACIN <=0.25 SENSITIVE Sensitive     GENTAMICIN <=1 SENSITIVE Sensitive     IMIPENEM <=0.25 SENSITIVE Sensitive     NITROFURANTOIN <=16 SENSITIVE Sensitive     TRIMETH/SULFA <=20 SENSITIVE Sensitive     AMPICILLIN/SULBACTAM <=2 SENSITIVE Sensitive     PIP/TAZO <=4 SENSITIVE Sensitive     Extended ESBL NEGATIVE Sensitive     * >=100,000 COLONIES/mL ESCHERICHIA COLI  MRSA PCR Screening     Status: None   Collection Time: 05/15/18  8:13 AM  Result Value Ref Range Status   MRSA by PCR NEGATIVE NEGATIVE Final    Comment:        The GeneXpert MRSA Assay (FDA approved for NASAL specimens only), is one component of a comprehensive MRSA colonization surveillance program. It is not intended to diagnose MRSA infection nor to guide or monitor treatment for MRSA infections. Performed at [facility], [place] [address] [place], [place] [address]   Culture, blood (routine x 2)     Status: None (Preliminary result)   Collection Time: 05/20/18  1:07 PM    Result Value Ref Range Status   Specimen  Description BLOOD LEFT ANTECUBITAL  Final   Special Requests   Final    BOTTLES DRAWN AEROBIC ONLY Blood Culture adequate volume   Culture   Final    NO GROWTH 1 DAY Performed at [facility], [address] [place], [place] [address]    Report Status PENDING  Incomplete  Culture, blood (routine x 2)     Status: None (Preliminary result)   Collection Time: 05/20/18  1:17 PM  Result Value Ref Range Status   Specimen Description BLOOD LEFT HAND  Final   Special Requests   Final    BOTTLES DRAWN AEROBIC ONLY Blood Culture adequate volume   Culture   Final    NO GROWTH 1 DAY Performed at [facility], [place] [address] [place], [place] [address]    Report Status PENDING  Incomplete  Culture, Urine     Status: None   Collection Time: 05/20/18  4:54 PM  Result Value Ref Range Status   Specimen Description URINE, CLEAN CATCH  Final   Special Requests NONE  Final   Culture   Final    NO GROWTH Performed at [facility], [place] [address] [place], [place] [address]    Report Status 05/21/2018 FINAL  Final      Radiology Studies: No results found.   Scheduled Meds: . atorvastatin  20 mg Oral q1800  . calcium-vitamin D  1 tablet Oral Q breakfast  . carvedilol  6.25 mg Oral BID WC  . cholecalciferol  1,000 Units Oral QHS  . ciprofloxacin  250 mg Oral BID  . eltrombopag  50 mg Oral Q0600  . lactobacillus acidophilus  2 tablet Oral TID  . multivitamin  1 tablet Oral Daily  . spironolactone  25 mg Oral Daily   Continuous Infusions:   LOS: 7 days   Time Spent in minutes   45 minutes (greater than 50% of time spent with patient face to face, as well as reviewing old records, calling consults, and formulating a plan)  Cristal Ford D.O. on 05/21/2018 at 3:13 PM  Between 7am to 7pm - Please see pager noted on [email or web address]  After 7pm go to [email or web address]  And look for the night coverage person covering for me after  hours  [facility] Office  [phone]

## 2018-05-21 NOTE — Progress Notes (Signed)
Physical Therapy Treatment Patient Details Name: Lisa James MRN: [id number] DOB: [birth year] Today's Date: 05/21/2018    History of Present Illness Pt is an 84 y/o female admitted from ALF following fall. Unsure of cause, however, imaging revealed R humeral neck fx. Pt is s/p closed reduction. PMH includes MI, dementia, CKD, CAD s/p CABG, CHF, DM, HTN, and subdural hematoma.     PT Comments    Pt progressing slowly towards physical therapy goals. +2 will be required to progress ambulation, but was able to transition bed>chair with +1 mod assist only. Pt maintained NWB on the RUE well and sling was well positioned at the time of PT session. Will continue to follow and progress as able per POC.   Follow Up Recommendations  SNF;Supervision/Assistance - 24 hour     Equipment Recommendations  None recommended by PT    Recommendations for Other Services       Precautions / Restrictions Precautions Precautions: Fall Required Braces or Orthoses: Sling(at all times ) Restrictions Weight Bearing Restrictions: Yes RUE Weight Bearing: Non weight bearing    Mobility  Bed Mobility Overal bed mobility: Needs Assistance Bed Mobility: Supine to Sit     Supine to sit: Min assist     General bed mobility comments: L railing use. Pt was able to transition to EOB with VC's for sequencing, increased time, and assist for trunk elevation to full sitting position.   Transfers Overall transfer level: Needs assistance Equipment used: 1 person hand held assist Transfers: Sit to/from [facility] Sit to Stand: Mod assist Stand pivot transfers: Mod assist       General transfer comment: Heavy mod assist required for power-up to full stand. Pt was able to take pivotal steps around to the chair with somewhat of an uncontrolled descent. With cues, able to reposition herself in the chair so she was in the center and scooted back in the chair. Assist required with bed pad for  full scoot back.   Ambulation/Gait             General Gait Details: Unable to progress this session.    Stairs             Wheelchair Mobility    Modified Rankin (Stroke Patients Only)       Balance Overall balance assessment: Needs assistance Sitting-balance support: No upper extremity supported;Feet supported Sitting balance-Leahy Scale: Fair   Postural control: Posterior lean Standing balance support: Single extremity supported;During functional activity Standing balance-Leahy Scale: Poor Standing balance comment: Reliant on LUE and external support. Heavy posterior lean                            Cognition Arousal/Alertness: Awake/alert Behavior During Therapy: WFL for tasks assessed/performed Overall Cognitive Status: History of cognitive impairments - at baseline                                 General Comments: Dementia at baseline. Able to follow 1 step commands consistently      Exercises      General Comments        Pertinent Vitals/Pain Pain Assessment: Faces Faces Pain Scale: Hurts little more Pain Location: R shoulder Pain Descriptors / Indicators: Guarding;Sore    Home Living                      Prior  Function            PT Goals (current goals can now be found in the care plan section) Acute Rehab PT Goals Patient Stated Goal: to go home today PT Goal Formulation: With patient Time For Goal Achievement: 05/29/18 Potential to Achieve Goals: Fair Progress towards PT goals: Progressing toward goals    Frequency    Min 2X/week      PT Plan Current plan remains appropriate    Co-evaluation              AM-PAC PT "6 Clicks" Daily Activity  Outcome Measure  Difficulty turning over in bed (including adjusting bedclothes, sheets and blankets)?: A Little Difficulty moving from lying on back to sitting on the side of the bed? : A Little Difficulty sitting down on and standing up  from a chair with arms (e.g., wheelchair, bedside commode, etc,.)?: A Little Help needed moving to and from a bed to chair (including a wheelchair)?: A Little Help needed walking in hospital room?: Total Help needed climbing 3-5 steps with a railing? : Total 6 Click Score: 14    End of Session Equipment Utilized During Treatment: Gait belt;Other (comment)(RUE sling ) Activity Tolerance: Patient tolerated treatment well Patient left: in chair;with call bell/phone within reach;with chair alarm set Nurse Communication: Mobility status PT Visit Diagnosis: Unsteadiness on feet (R26.81);Muscle weakness (generalized) (M62.81);History of falling (Z91.81)     Time: 3382-5053 PT Time Calculation (min) (ACUTE ONLY): 15 min  Charges:  $Gait Training: 8-22 mins                     Rolinda Roan, PT, DPT Acute Rehabilitation Services Pager: [phone] Office: [phone]    Thelma Comp 05/21/2018, 12:06 PM

## 2018-05-22 LAB — CBC
HEMATOCRIT: 29 % — AB (ref 36.0–46.0)
Hemoglobin: 10.3 g/dL — ABNORMAL LOW (ref 12.0–15.0)
MCH: 33.2 pg (ref 26.0–34.0)
MCHC: 35.5 g/dL (ref 30.0–36.0)
MCV: 93.5 fL (ref 80.0–100.0)
Platelets: 136 10*3/uL — ABNORMAL LOW (ref 150–400)
RBC: 3.1 MIL/uL — ABNORMAL LOW (ref 3.87–5.11)
RDW: 13.2 % (ref 11.5–15.5)
WBC: 14.1 10*3/uL — ABNORMAL HIGH (ref 4.0–10.5)
nRBC: 0 % (ref 0.0–0.2)

## 2018-05-22 MED ORDER — SODIUM CHLORIDE 1 G PO TABS
1.0000 g | ORAL_TABLET | Freq: Every day | ORAL | Status: DC
  Administered 2018-05-23: 1 g via ORAL
  Filled 2018-05-22: qty 1

## 2018-05-22 MED ORDER — SODIUM CHLORIDE 1 G PO TABS
1.0000 g | ORAL_TABLET | Freq: Once | ORAL | Status: AC
  Administered 2018-05-22: 1 g via ORAL
  Filled 2018-05-22: qty 1

## 2018-05-22 NOTE — Care Management Important Message (Signed)
Important Message  Patient Details  Name: Lisa James MRN: [id number] Date of Birth: [birth year]   Medicare Important Message Given:  Yes    Ramirez Fullbright P Xion Debruyne 05/22/2018, 4:04 PM

## 2018-05-22 NOTE — Progress Notes (Signed)
PROGRESS NOTE    Lisa James  GGE:[id number] DOB: [birth year] DOA: 05/14/2018 PCP: Merrilee Seashore, MD   Brief Narrative:  Admitted for right humeral neck fracture and underwent surgery by orthopedics.  Plan is for discharge to SNF however patients with WBC count rose today, and her sodium worsened. Assessment & Plan   Hyponatremia -On admission sodium was 120 however improved to 132 as of 05/19/2018.  However today patient's sodium level was noted to be down to 115 despite receiving IV fluids. -Discontinued IV fluids -?  Hypovolemic hyponatremia however patient does not appear to be volume overloaded, vs "Tea and toast" -Monitor intake and output, daily weights -Currently mental status does appear to be at baseline, no neurological deficits -given IV lasix -On 10/31: Discussed with Dr. Jimmy Footman, nephrologist, who recommended waiting and watching as well as place patient on fluid restriction -Discussed with Dr. Augustin Coupe, nephro, recommended starting low dose sodium tab for one week -sodium currently 117 -continue to monitor BMP  Leukocytosis -Upon admission, WBC 26, however had trended down, the back up to 18.5 -WBC today 14.1 -Patient does not appear to have further infective etiology, currently being treated for urinary tract infection -Repeat UA appears improved, urine and blood cultures pending UA -Currently afebrile with no complaints of upper respiratory symptoms -Discussed with Dr. Burr Medico, recommended not doing further workup, likely inflammatory  Fall with displaced transverse fracture through the right humeral neck -CT and x-ray of the right shoulder show displaced transverse fracture through the humeral neck -Orthopedic surgery consulted and appreciated, status post closed reduction under conscious sedation -Orthopedics recommending sling on at all times with outpatient follow-up in 7 days  Acute metabolic encephalopathy in the setting of dementia -Appears to be at  baseline, mental status improved -Suspect worsened by UTI -Continue fall precautions -Per family request, Aricept and Namenda have been discontinued as I do not feel it is been helping  Ecoli UTI -Urine culture showed pansensitive E. Coli -Patient was placed on Rocephin and transitioned to oral ciprofloxacin  Essential Hypertension -Stable, continue Coreg, Aldactone  Hypokalemia/Hypocalcemia  -resolved, continue to monitor   Hypomagnesemia -replaced, currently 2.2  History of ITP -Patient follows with Dr. Burr Medico, oncology and was last seen on 04/23/2018 -Placed on Promacta to maintain platelet counts above 50,000 -She is to follow-up with oncology as an outpatient  CAD -Stable, no complaints of chest pain or shortness of breath -Continue Coreg, statin  Chronic kidney disease, stage III -Stable  Pressure injury of skin -Stage II, sacral -Continue wound care  Anemia with thrombocytopenia  -No obvious bleeding -patient is currently on Promacta due to history of ITP -hemoglobin currently 10.3 -platelets 136  DVT Prophylaxis  SCDs  Code Status: Full  Family Communication: None at bedside  Disposition Plan: Admitted. Plan to discharge to SNF when sodium level has improved  Procedures: Closed reduction of right humeral fracture  Consultations: Orthopedic surgery Oncology, Dr. Burr Medico, via phone Nephrology, Dr. Jimmy Footman and Dr. Augustin Coupe, via phone  Antibiotics   Anti-infectives (From admission, onward)   Start     Dose/Rate Route Frequency Ordered Stop   05/18/18 1100  ciprofloxacin (CIPRO) tablet 250 mg     250 mg Oral 2 times daily 05/18/18 0939 05/23/18 0759   05/15/18 2200  cefTRIAXone (ROCEPHIN) 1 g in sodium chloride 0.9 % 100 mL IVPB  Status:  Discontinued     1 g 200 mL/hr over 30 Minutes Intravenous Every 24 hours 05/15/18 0156 05/18/18 0939   05/14/18 2200  cefTRIAXone (  ROCEPHIN) 1 g in sodium chloride 0.9 % 100 mL IVPB     1 g 200 mL/hr over 30  Minutes Intravenous  Once 05/14/18 2157 05/14/18 2302      Subjective:   Lisa James seen and examined today.  Patient with dementia. She has no complaints today. Would like to go back to sleep.  Objective:   Vitals:   05/21/18 1725 05/21/18 2019 05/22/18 0406 05/22/18 0819  BP: (!) 156/71 (!) 126/55 (!) 169/58 (!) 171/58  Pulse: 66 (!) 57 64 62  Resp: 18 15 19 16   Temp: 97.7 F (36.5 C) 97.8 F (36.6 C) 98 F (36.7 C) 97.8 F (36.6 C)  TempSrc: Oral Oral Oral Oral  SpO2: 100% 98% 99% 100%  Weight:      Height:        Intake/Output Summary (Last 24 hours) at 05/22/2018 1208 Last data filed at 05/22/2018 5784 Gross per 24 hour  Intake 750 ml  Output -  Net 750 ml   Filed Weights   05/20/18 1900 05/20/18 2059 05/21/18 0500  Weight: 53.6 kg 53.6 kg 53.6 kg   Exam  General: Well developed, well nourished, NAD, appears stated age  15: NCAT,mucous membranes moist.   Neck: Supple  Cardiovascular: S1 S2 auscultated, +SEM, RRR  Respiratory: Clear to auscultation bilaterally with equal chest rise  Abdomen: Soft, nontender, nondistended, + bowel sounds  Extremities: warm dry without cyanosis clubbing or edema. RUE in sling  Neuro: AAOx1 (self only), nonfocal  Psych: does not, appropriate mood and affect  Data Reviewed: I have personally reviewed following labs and imaging studies  CBC: Recent Labs  Lab 05/20/18 0535 05/20/18 0848 05/20/18 1316 05/21/18 0601 05/22/18 0704  WBC 17.5* 18.5* 14.5* 15.7* 14.1*  NEUTROABS  --   --  11.0* 11.1*  --   HGB 10.8* 10.9* 9.8* 10.5* 10.3*  HCT 31.2* 30.9* 29.5* 30.7* 29.0*  MCV 93.7 93.4 96.7 95.6 93.5  PLT 103* 206 PLATELET CLUMPS NOTED ON SMEAR, COUNT APPEARS ADEQUATE 146* 696*   Basic Metabolic Panel: Recent Labs  Lab 05/16/18 0631  05/20/18 0535 05/20/18 0848  05/20/18 2014 05/21/18 0253 05/21/18 0601 05/21/18 1232 05/22/18 0704  NA 131*   < > 124* 123*   < > 121* 121* 121* 121* 117*  K 2.9*   <  > 6.2* 4.8  --  4.7 4.9 4.8 4.9 5.0  CL 108   < > 100 97*  --  95* 95* 96* 92* 91*  CO2 17*   < > 17* 20*  --  20* 21* 22 25 24   GLUCOSE 80   < > 97 98  --  197* 103* 97 132* 106*  BUN 10   < > 8 9  --  10 10 10 11 12   CREATININE 0.87   < > 0.89 0.88  --  1.05* 0.99 1.09* 1.16* 1.20*  CALCIUM 6.0*   < > 8.4* 8.2*  --  8.2* 8.2* 8.3* 8.4* 8.3*  MG 1.1*  --  1.5* 1.5*  --   --  2.2  --   --   --    < > = values in this interval not displayed.   GFR: Estimated Creatinine Clearance: 24.5 mL/min (A) (by C-G formula based on SCr of 1.2 mg/dL (H)). Liver Function Tests: Recent Labs  Lab 05/16/18 0631  ALBUMIN 2.0*   No results for input(s): LIPASE, AMYLASE in the last 168 hours. No results for input(s): AMMONIA in the last  168 hours. Coagulation Profile: No results for input(s): INR, PROTIME in the last 168 hours. Cardiac Enzymes: No results for input(s): CKTOTAL, CKMB, CKMBINDEX, TROPONINI in the last 168 hours. BNP (last 3 results) No results for input(s): PROBNP in the last 8760 hours. HbA1C: No results for input(s): HGBA1C in the last 72 hours. CBG: Recent Labs  Lab 05/16/18 0730  GLUCAP 105*   Lipid Profile: No results for input(s): CHOL, HDL, LDLCALC, TRIG, CHOLHDL, LDLDIRECT in the last 72 hours. Thyroid Function Tests: No results for input(s): TSH, T4TOTAL, FREET4, T3FREE, THYROIDAB in the last 72 hours. Anemia Panel: No results for input(s): VITAMINB12, FOLATE, FERRITIN, TIBC, IRON, RETICCTPCT in the last 72 hours. Urine analysis:    Component Value Date/Time   COLORURINE YELLOW 05/20/2018 [place] 05/20/2018 1418   APPEARANCEUR Hazy 11/19/2012 1750   LABSPEC 1.011 05/20/2018 1418   LABSPEC 1.014 11/19/2012 1750   PHURINE 5.0 05/20/2018 1418   GLUCOSEU NEGATIVE 05/20/2018 1418   GLUCOSEU Negative 11/19/2012 1750   HGBUR SMALL (A) 05/20/2018 1418   BILIRUBINUR NEGATIVE 05/20/2018 1418   BILIRUBINUR negative 05/12/2015 1823   BILIRUBINUR Negative  11/19/2012 [place] 05/20/2018 1418   PROTEINUR NEGATIVE 05/20/2018 1418   UROBILINOGEN 0.2 05/13/2015 0504   NITRITE NEGATIVE 05/20/2018 1418   LEUKOCYTESUR MODERATE (A) 05/20/2018 1418   LEUKOCYTESUR 2+ 11/19/2012 1750   Sepsis Labs: @LABRCNTIP (procalcitonin:4,lacticidven:4)  ) Recent Results (from the past 240 hour(s))  Urine culture     Status: Abnormal   Collection Time: 05/14/18  9:06 PM  Result Value Ref Range Status   Specimen Description URINE, RANDOM  Final   Special Requests   Final    NONE Performed at [facility], [place] [address], [place], [place] [address]    Culture >=100,000 COLONIES/mL ESCHERICHIA COLI (A)  Final   Report Status 05/17/2018 FINAL  Final   Organism ID, Bacteria ESCHERICHIA COLI (A)  Final      Susceptibility   Escherichia coli - MIC*    AMPICILLIN <=2 SENSITIVE Sensitive     CEFAZOLIN <=4 SENSITIVE Sensitive     CEFTRIAXONE <=1 SENSITIVE Sensitive     CIPROFLOXACIN <=0.25 SENSITIVE Sensitive     GENTAMICIN <=1 SENSITIVE Sensitive     IMIPENEM <=0.25 SENSITIVE Sensitive     NITROFURANTOIN <=16 SENSITIVE Sensitive     TRIMETH/SULFA <=20 SENSITIVE Sensitive     AMPICILLIN/SULBACTAM <=2 SENSITIVE Sensitive     PIP/TAZO <=4 SENSITIVE Sensitive     Extended ESBL NEGATIVE Sensitive     * >=100,000 COLONIES/mL ESCHERICHIA COLI  MRSA PCR Screening     Status: None   Collection Time: 05/15/18  8:13 AM  Result Value Ref Range Status   MRSA by PCR NEGATIVE NEGATIVE Final    Comment:        The GeneXpert MRSA Assay (FDA approved for NASAL specimens only), is one component of a comprehensive MRSA colonization surveillance program. It is not intended to diagnose MRSA infection nor to guide or monitor treatment for MRSA infections. Performed at [facility], [place] [address] [place], [place] [address]   Culture, blood (routine x 2)     Status: None (Preliminary result)   Collection Time: 05/20/18  1:07 PM  Result  Value Ref Range Status   Specimen Description BLOOD LEFT ANTECUBITAL  Final   Special Requests   Final    BOTTLES DRAWN AEROBIC ONLY Blood Culture adequate volume   Culture   Final  NO GROWTH 2 DAYS Performed at [facility], [place] [address] [place], [place] [address]    Report Status PENDING  Incomplete  Culture, blood (routine x 2)     Status: None (Preliminary result)   Collection Time: 05/20/18  1:17 PM  Result Value Ref Range Status   Specimen Description BLOOD LEFT HAND  Final   Special Requests   Final    BOTTLES DRAWN AEROBIC ONLY Blood Culture adequate volume   Culture   Final    NO GROWTH 2 DAYS Performed at [facility], [place] [address] [place], [place] [address]    Report Status PENDING  Incomplete  Culture, Urine     Status: None   Collection Time: 05/20/18  4:54 PM  Result Value Ref Range Status   Specimen Description URINE, CLEAN CATCH  Final   Special Requests NONE  Final   Culture   Final    NO GROWTH Performed at [facility], [place] [address], [place],  [address]    Report Status 05/21/2018 FINAL  Final      Radiology Studies: No results found.   Scheduled Meds: . atorvastatin  20 mg Oral q1800  . calcium-vitamin D  1 tablet Oral Q breakfast  . carvedilol  6.25 mg Oral BID WC  . cholecalciferol  1,000 Units Oral QHS  . ciprofloxacin  250 mg Oral BID  . eltrombopag  50 mg Oral Q0600  . lactobacillus acidophilus  2 tablet Oral TID  . multivitamin  1 tablet Oral Daily  . [START ON 05/23/2018] sodium chloride  1 g Oral Daily   Continuous Infusions:   LOS: 8 days   Time Spent in minutes   30 minutes  Fedrick Cefalu D.O. on 05/22/2018 at 12:08 PM  Between 7am to 7pm - Please see pager noted on [email or web address]  After 7pm go to [email or web address]  And look for the night coverage person covering for me after hours  [facility] Office  [phone]

## 2018-05-22 NOTE — Clinical Social Work Note (Signed)
CSW continuing to follow patient progress. MD's progress note reviewed and per MD - "Plan to discharge to SNF when sodium level has improved". Call made to [employer], admissions director at [facility] and provided update. Weekend SW will also be updated.  Mylz Yuan Givens, MSW, LCSW Licensed Clinical Social Worker [place] [phone]

## 2018-05-22 NOTE — Progress Notes (Signed)
Right shoulder in sling Looks stable Pt comfortable F/u next week for xrays

## 2018-05-23 LAB — CBC
HCT: 30.3 % — ABNORMAL LOW (ref 36.0–46.0)
HEMOGLOBIN: 10.7 g/dL — AB (ref 12.0–15.0)
MCH: 33.4 pg (ref 26.0–34.0)
MCHC: 35.3 g/dL (ref 30.0–36.0)
MCV: 94.7 fL (ref 80.0–100.0)
NRBC: 0 % (ref 0.0–0.2)
Platelets: 126 10*3/uL — ABNORMAL LOW (ref 150–400)
RBC: 3.2 MIL/uL — ABNORMAL LOW (ref 3.87–5.11)
RDW: 13.3 % (ref 11.5–15.5)
WBC: 15.4 10*3/uL — AB (ref 4.0–10.5)

## 2018-05-23 LAB — BASIC METABOLIC PANEL
ANION GAP: 5 (ref 5–15)
BUN: 16 mg/dL (ref 8–23)
CALCIUM: 8.3 mg/dL — AB (ref 8.9–10.3)
CO2: 23 mmol/L (ref 22–32)
CREATININE: 1.16 mg/dL — AB (ref 0.44–1.00)
Chloride: 89 mmol/L — ABNORMAL LOW (ref 98–111)
GFR calc non Af Amer: 41 mL/min — ABNORMAL LOW (ref >60)
GFR, EST AFRICAN AMERICAN: 47 mL/min — AB (ref >60)
Glucose, Bld: 108 mg/dL — ABNORMAL HIGH (ref 70–99)
Potassium: 4.7 mmol/L (ref 3.5–5.1)
Sodium: 117 mmol/L — CL (ref 135–145)

## 2018-05-23 MED ORDER — SODIUM CHLORIDE 1 G PO TABS
1.0000 g | ORAL_TABLET | Freq: Two times a day (BID) | ORAL | Status: DC
  Administered 2018-05-23: 1 g via ORAL
  Filled 2018-05-23 (×2): qty 1

## 2018-05-23 MED ORDER — FUROSEMIDE 10 MG/ML IJ SOLN
20.0000 mg | Freq: Once | INTRAMUSCULAR | Status: AC
  Administered 2018-05-23: 20 mg via INTRAVENOUS
  Filled 2018-05-23: qty 2

## 2018-05-23 NOTE — Progress Notes (Signed)
PROGRESS NOTE    KATLYN MULDREW  OBS:[id number] DOB: 09-22-1927 DOA: 05/14/2018 PCP: Merrilee Seashore, MD   Brief Narrative:  Admitted for right humeral neck fracture and underwent surgery by orthopedics.  Plan is for discharge to SNF however patients with WBC count rose today, and her sodium worsened. Assessment & Plan   Hyponatremia -On admission sodium was 120 however improved to 132 as of 05/19/2018.  However today patient's sodium level was noted to be down to 115 despite receiving IV fluids. -Discontinued IV fluids -?  Hypovolemic hyponatremia however patient does not appear to be volume overloaded, vs "Tea and toast" -Monitor intake and output, daily weights -Currently mental status does appear to be at baseline, no neurological deficits -given IV lasix -On 10/31: Discussed with Dr. Jimmy Footman, nephrologist, who recommended waiting and watching as well as place patient on fluid restriction -Discussed with Dr. Augustin Coupe, nephro, recommended starting low dose sodium tab for one week -sodium currently 117 (patient asymptomatic and neurologically intact) -Will increase sodium tablets to BID and give dose of IV lasix -Continue to monitor BMP  Leukocytosis -Upon admission, WBC 26, however had trended down, the back up to 18.5 -WBC today 15.4 -Patient does not appear to have further infective etiology, currently being treated for urinary tract infection -Repeat UA appears improved, urine culture showed no growth.  Blood cultures appear to be negative to date. -Currently afebrile with no complaints of upper respiratory symptoms -Discussed with Dr. Burr Medico, recommended not doing further workup, likely inflammatory  Fall with displaced transverse fracture through the right humeral neck -CT and x-ray of the right shoulder show displaced transverse fracture through the humeral neck -Orthopedic surgery consulted and appreciated, status post closed reduction under conscious  sedation -Orthopedics recommending sling on at all times with outpatient follow-up in 7 days  Acute metabolic encephalopathy in the setting of dementia -Appears to be at baseline, mental status improved -Suspect worsened by UTI -Continue fall precautions -Per family request, Aricept and Namenda have been discontinued as I do not feel it is been helping  Ecoli UTI -Urine culture showed pansensitive E. Coli -Patient was placed on Rocephin and transitioned to oral ciprofloxacin -Given leukocytosis, repeat urine culture on 05/20/2018 showed no growth  Essential Hypertension -Uncontrolled, continue Coreg -Aldactone discontinued, will give IV Lasix  Hypokalemia/Hypocalcemia  -resolved, continue to monitor   Hypomagnesemia -replaced, currently 2.2  History of ITP -Patient follows with Dr. Burr Medico, oncology and was last seen on 04/23/2018 -Placed on Promacta to maintain platelet counts above 50,000 -She is to follow-up with oncology as an outpatient  CAD -Stable, no complaints of chest pain or shortness of breath -Continue Coreg, statin  Chronic kidney disease, stage III -Stable  Pressure injury of skin -Stage II, sacral -Continue wound care  Anemia with thrombocytopenia  -No obvious bleeding -patient is currently on Promacta due to history of ITP -hemoglobin currently 10.3 -platelets 126  DVT Prophylaxis  SCDs  Code Status: Full  Family Communication: None at bedside  Disposition Plan: Admitted. Plan to discharge to SNF when sodium level has improved  Procedures: Closed reduction of right humeral fracture  Consultations: Orthopedic surgery Oncology, Dr. Burr Medico, via phone Nephrology, Dr. Jimmy Footman and Dr. Augustin Coupe, via phone  Antibiotics   Anti-infectives (From admission, onward)   Start     Dose/Rate Route Frequency Ordered Stop   05/18/18 1100  ciprofloxacin (CIPRO) tablet 250 mg     250 mg Oral 2 times daily 05/18/18 0939 05/22/18 2223   05/15/18 2200  cefTRIAXone (ROCEPHIN) 1 g in sodium chloride 0.9 % 100 mL IVPB  Status:  Discontinued     1 g 200 mL/hr over 30 Minutes Intravenous Every 24 hours 05/15/18 0156 05/18/18 0939   05/14/18 2200  cefTRIAXone (ROCEPHIN) 1 g in sodium chloride 0.9 % 100 mL IVPB     1 g 200 mL/hr over 30 Minutes Intravenous  Once 05/14/18 2157 05/14/18 2302      Subjective:   Seward Meth seen and examined today.  Patient with dementia.  Currently sitting up having breakfast.  Denies current chest pain, shortness of breath, abdominal pain, nausea or vomiting, diarrhea or constipation.  Would like to go home today.  Objective:   Vitals:   05/22/18 1728 05/22/18 2158 05/23/18 0431 05/23/18 0904  BP: (!) 117/99 119/69 (!) 164/80 (!) 192/59  Pulse: 66 67 68 64  Resp:  16 14 18   Temp:  97.6 F (36.4 C) 98.4 F (36.9 C) 97.7 F (36.5 C)  TempSrc:  Oral Oral Oral  SpO2:  98% 98% 100%  Weight:      Height:        Intake/Output Summary (Last 24 hours) at 05/23/2018 1132 Last data filed at 05/23/2018 1000 Gross per 24 hour  Intake 918 ml  Output 625 ml  Net 293 ml   Filed Weights   05/20/18 1900 05/20/18 2059 05/21/18 0500  Weight: 53.6 kg 53.6 kg 53.6 kg   Exam  General: Well developed, well nourished, elderly, NAD, appears stated age  82: NCAT,mucous membranes moist.   Neck: Supple  Cardiovascular: S1 S2 auscultated, SEM, RRR  Respiratory: Clear to auscultation bilaterally with equal chest rise  Abdomen: Soft, nontender, nondistended, + bowel sounds  Extremities: warm dry without cyanosis clubbing or edema.  RUE in sling  Neuro: AAOx1 (self only), nonfocal  Psych: Pleasant, appropriate mood and affect  Data Reviewed: I have personally reviewed following labs and imaging studies  CBC: Recent Labs  Lab 05/20/18 0848 05/20/18 1316 05/21/18 0601 05/22/18 0704 05/23/18 0657  WBC 18.5* 14.5* 15.7* 14.1* 15.4*  NEUTROABS  --  11.0* 11.1*  --   --   HGB 10.9* 9.8* 10.5* 10.3*  10.7*  HCT 30.9* 29.5* 30.7* 29.0* 30.3*  MCV 93.4 96.7 95.6 93.5 94.7  PLT 206 PLATELET CLUMPS NOTED ON SMEAR, COUNT APPEARS ADEQUATE 146* 136* 295*   Basic Metabolic Panel: Recent Labs  Lab 05/20/18 0535 05/20/18 0848  05/21/18 0253 05/21/18 0601 05/21/18 1232 05/22/18 0704 05/23/18 0657  NA 124* 123*   < > 121* 121* 121* 117* 117*  K 6.2* 4.8   < > 4.9 4.8 4.9 5.0 4.7  CL 100 97*   < > 95* 96* 92* 91* 89*  CO2 17* 20*   < > 21* 22 25 24 23   GLUCOSE 97 98   < > 103* 97 132* 106* 108*  BUN 8 9   < > 10 10 11 12 16   CREATININE 0.89 0.88   < > 0.99 1.09* 1.16* 1.20* 1.16*  CALCIUM 8.4* 8.2*   < > 8.2* 8.3* 8.4* 8.3* 8.3*  MG 1.5* 1.5*  --  2.2  --   --   --   --    < > = values in this interval not displayed.   GFR: Estimated Creatinine Clearance: 25.3 mL/min (A) (by C-G formula based on SCr of 1.16 mg/dL (H)). Liver Function Tests: No results for input(s): AST, ALT, ALKPHOS, BILITOT, PROT, ALBUMIN in the last 168 hours. No  results for input(s): LIPASE, AMYLASE in the last 168 hours. No results for input(s): AMMONIA in the last 168 hours. Coagulation Profile: No results for input(s): INR, PROTIME in the last 168 hours. Cardiac Enzymes: No results for input(s): CKTOTAL, CKMB, CKMBINDEX, TROPONINI in the last 168 hours. BNP (last 3 results) No results for input(s): PROBNP in the last 8760 hours. HbA1C: No results for input(s): HGBA1C in the last 72 hours. CBG: No results for input(s): GLUCAP in the last 168 hours. Lipid Profile: No results for input(s): CHOL, HDL, LDLCALC, TRIG, CHOLHDL, LDLDIRECT in the last 72 hours. Thyroid Function Tests: No results for input(s): TSH, T4TOTAL, FREET4, T3FREE, THYROIDAB in the last 72 hours. Anemia Panel: No results for input(s): VITAMINB12, FOLATE, FERRITIN, TIBC, IRON, RETICCTPCT in the last 72 hours. Urine analysis:    Component Value Date/Time   COLORURINE YELLOW 05/20/2018 [place] 05/20/2018 1418    APPEARANCEUR Hazy 11/19/2012 1750   LABSPEC 1.011 05/20/2018 1418   LABSPEC 1.014 11/19/2012 1750   PHURINE 5.0 05/20/2018 1418   GLUCOSEU NEGATIVE 05/20/2018 1418   GLUCOSEU Negative 11/19/2012 1750   HGBUR SMALL (A) 05/20/2018 1418   BILIRUBINUR NEGATIVE 05/20/2018 1418   BILIRUBINUR negative 05/12/2015 1823   BILIRUBINUR Negative 11/19/2012 [place] 05/20/2018 1418   PROTEINUR NEGATIVE 05/20/2018 1418   UROBILINOGEN 0.2 05/13/2015 0504   NITRITE NEGATIVE 05/20/2018 1418   LEUKOCYTESUR MODERATE (A) 05/20/2018 1418   LEUKOCYTESUR 2+ 11/19/2012 1750   Sepsis Labs: @LABRCNTIP (procalcitonin:4,lacticidven:4)  ) Recent Results (from the past 240 hour(s))  Urine culture     Status: Abnormal   Collection Time: 05/14/18  9:06 PM  Result Value Ref Range Status   Specimen Description URINE, RANDOM  Final   Special Requests   Final    NONE Performed at [facility], [place] [address] [place], [place] [address]    Culture >=100,000 COLONIES/mL ESCHERICHIA COLI (A)  Final   Report Status 05/17/2018 FINAL  Final   Organism ID, Bacteria ESCHERICHIA COLI (A)  Final      Susceptibility   Escherichia coli - MIC*    AMPICILLIN <=2 SENSITIVE Sensitive     CEFAZOLIN <=4 SENSITIVE Sensitive     CEFTRIAXONE <=1 SENSITIVE Sensitive     CIPROFLOXACIN <=0.25 SENSITIVE Sensitive     GENTAMICIN <=1 SENSITIVE Sensitive     IMIPENEM <=0.25 SENSITIVE Sensitive     NITROFURANTOIN <=16 SENSITIVE Sensitive     TRIMETH/SULFA <=20 SENSITIVE Sensitive     AMPICILLIN/SULBACTAM <=2 SENSITIVE Sensitive     PIP/TAZO <=4 SENSITIVE Sensitive     Extended ESBL NEGATIVE Sensitive     * >=100,000 COLONIES/mL ESCHERICHIA COLI  MRSA PCR Screening     Status: None   Collection Time: 05/15/18  8:13 AM  Result Value Ref Range Status   MRSA by PCR NEGATIVE NEGATIVE Final    Comment:        The GeneXpert MRSA Assay (FDA approved for NASAL specimens only), is one component of a comprehensive  MRSA colonization surveillance program. It is not intended to diagnose MRSA infection nor to guide or monitor treatment for MRSA infections. Performed at [facility], [place] [address] [place], [place] [address]   Culture, blood (routine x 2)     Status: None (Preliminary result)   Collection Time: 05/20/18  1:07 PM  Result Value Ref Range Status   Specimen Description BLOOD LEFT ANTECUBITAL  Final   Special Requests   Final  BOTTLES DRAWN AEROBIC ONLY Blood Culture adequate volume   Culture   Final    NO GROWTH 3 DAYS Performed at [facility], [place] [address] [place], [place] [address]    Report Status PENDING  Incomplete  Culture, blood (routine x 2)     Status: None (Preliminary result)   Collection Time: 05/20/18  1:17 PM  Result Value Ref Range Status   Specimen Description BLOOD LEFT HAND  Final   Special Requests   Final    BOTTLES DRAWN AEROBIC ONLY Blood Culture adequate volume   Culture   Final    NO GROWTH 3 DAYS Performed at [facility], [place] [address] [place], [place] [address]    Report Status PENDING  Incomplete  Culture, Urine     Status: None   Collection Time: 05/20/18  4:54 PM  Result Value Ref Range Status   Specimen Description URINE, CLEAN CATCH  Final   Special Requests NONE  Final   Culture   Final    NO GROWTH Performed at [facility], [place] [address] [place], [place] [address]    Report Status 05/21/2018 FINAL  Final      Radiology Studies: No results found.   Scheduled Meds: . atorvastatin  20 mg Oral q1800  . calcium-vitamin D  1 tablet Oral Q breakfast  . carvedilol  6.25 mg Oral BID WC  . cholecalciferol  1,000 Units Oral QHS  . eltrombopag  50 mg Oral Q0600  . lactobacillus acidophilus  2 tablet Oral TID  . multivitamin  1 tablet Oral Daily  . sodium chloride  1 g Oral BID WC   Continuous Infusions:   LOS: 9 days   Time Spent in minutes   30 minutes  Edis Huish D.O. on 05/23/2018 at 11:32  AM  Between 7am to 7pm - Please see pager noted on [email or web address]  After 7pm go to [email or web address]  And look for the night coverage person covering for me after hours  [facility] Office  [phone]

## 2018-05-24 LAB — BASIC METABOLIC PANEL
Anion gap: 4 — ABNORMAL LOW (ref 5–15)
BUN: 12 mg/dL (ref 8–23)
BUN: 17 mg/dL (ref 8–23)
CALCIUM: 8.3 mg/dL — AB (ref 8.9–10.3)
CHLORIDE: 91 mmol/L — AB (ref 98–111)
CO2: 24 mmol/L (ref 22–32)
CO2: 24 mmol/L (ref 22–32)
CREATININE: 1.15 mg/dL — AB (ref 0.44–1.00)
Calcium: 8.3 mg/dL — ABNORMAL LOW (ref 8.9–10.3)
Chloride: 90 mmol/L — ABNORMAL LOW (ref 98–111)
Creatinine, Ser: 1.2 mg/dL — ABNORMAL HIGH (ref 0.44–1.00)
GFR calc Af Amer: 48 mL/min — ABNORMAL LOW (ref >60)
GFR calc non Af Amer: 39 mL/min — ABNORMAL LOW (ref >60)
GFR calc non Af Amer: 41 mL/min — ABNORMAL LOW (ref >60)
GFR, EST AFRICAN AMERICAN: 45 mL/min — AB (ref >60)
GLUCOSE: 106 mg/dL — AB (ref 70–99)
GLUCOSE: 115 mg/dL — AB (ref 70–99)
POTASSIUM: 5 mmol/L (ref 3.5–5.1)
POTASSIUM: 4.5 mmol/L (ref 3.5–5.1)
SODIUM: 118 mmol/L — AB (ref 135–145)
Sodium: 117 mmol/L — CL (ref 135–145)

## 2018-05-24 LAB — MAGNESIUM: Magnesium: 1.8 mg/dL (ref 1.7–2.4)

## 2018-05-24 MED ORDER — FUROSEMIDE 10 MG/ML IJ SOLN
10.0000 mg | Freq: Once | INTRAMUSCULAR | Status: AC
  Administered 2018-05-24: 10 mg via INTRAVENOUS
  Filled 2018-05-24: qty 2

## 2018-05-24 MED ORDER — SODIUM CHLORIDE 1 G PO TABS
1.0000 g | ORAL_TABLET | Freq: Three times a day (TID) | ORAL | Status: DC
  Administered 2018-05-24 – 2018-05-26 (×7): 1 g via ORAL
  Filled 2018-05-24 (×8): qty 1

## 2018-05-24 NOTE — Progress Notes (Signed)
PROGRESS NOTE    Lisa James  LKG:[id number] DOB: 12-30-1927 DOA: 05/14/2018 PCP: Merrilee Seashore, MD   Brief Narrative:  Admitted for right humeral neck fracture and underwent surgery by orthopedics.  Plan is for discharge to SNF however patients with WBC count rose today, and her sodium worsened. Assessment & Plan   Hyponatremia -On admission sodium was 120 however improved to 132 as of 05/19/2018.  However today patient's sodium level was noted to be down to 115 despite receiving IV fluids. -Discontinued IV fluids -?  Hypovolemic hyponatremia however patient does not appear to be volume overloaded, vs "Tea and toast" -Monitor intake and output, daily weights -Currently mental status does appear to be at baseline, no neurological deficits -given IV lasix -On 10/31: Discussed with Dr. Jimmy Footman, nephrologist, who recommended waiting and watching as well as place patient on fluid restriction -Discussed with Dr. Augustin Coupe, nephro, recommended starting low dose sodium tab for one week -sodium currently 118 (patient asymptomatic and neurologically intact) -Will continue to increase sodium tablets to TID and give small dose of IV lasix (10mg )  -Continue to monitor BMP  Leukocytosis -Upon admission, WBC 26, however had trended down, the back up to 18.5 -WBC today 15.4 -Patient does not appear to have further infective etiology, currently being treated for urinary tract infection -Repeat UA appears improved, urine culture showed no growth.  Blood cultures appear to be negative to date. -Currently afebrile with no complaints of upper respiratory symptoms -Discussed with Dr. Burr Medico, recommended not doing further workup, likely inflammatory  Fall with displaced transverse fracture through the right humeral neck -CT and x-ray of the right shoulder show displaced transverse fracture through the humeral neck -Orthopedic surgery consulted and appreciated, status post closed reduction under  conscious sedation -Orthopedics recommending sling on at all times with outpatient follow-up in 7 days  Acute metabolic encephalopathy in the setting of dementia -Appears to be at baseline, mental status improved -Suspect worsened by UTI -Continue fall precautions -Per family request, Aricept and Namenda have been discontinued as I do not feel it is been helping  Ecoli UTI -Urine culture showed pansensitive E. Coli -Patient was placed on Rocephin and transitioned to oral ciprofloxacin -Given leukocytosis, repeat urine culture on 05/20/2018 showed no growth  Essential Hypertension -Uncontrolled, continue Coreg -Aldactone discontinued, will give IV Lasix  Hypokalemia/Hypocalcemia  -resolved, continue to monitor   Hypomagnesemia -replaced, currently 1.8  History of ITP -Patient follows with Dr. Burr Medico, oncology and was last seen on 04/23/2018 -Placed on Promacta to maintain platelet counts above 50,000 -She is to follow-up with oncology as an outpatient  CAD -Stable, no complaints of chest pain or shortness of breath -Continue Coreg, statin  Chronic kidney disease, stage III -Stable  Pressure injury of skin -Stage II, sacral -Continue wound care  Anemia with thrombocytopenia  -No obvious bleeding -patient is currently on Promacta due to history of ITP -hemoglobin currently 10.7 -platelets 126  DVT Prophylaxis  SCDs  Code Status: Full  Family Communication: None at bedside  Disposition Plan: Admitted. Plan to discharge to SNF when sodium level has improved  Procedures: Closed reduction of right humeral fracture  Consultations: Orthopedic surgery Oncology, Dr. Burr Medico, via phone Nephrology, Dr. Jimmy Footman and Dr. Augustin Coupe, via phone  Antibiotics   Anti-infectives (From admission, onward)   Start     Dose/Rate Route Frequency Ordered Stop   05/18/18 1100  ciprofloxacin (CIPRO) tablet 250 mg     250 mg Oral 2 times daily 05/18/18 0939 05/22/18 2223  05/15/18  2200  cefTRIAXone (ROCEPHIN) 1 g in sodium chloride 0.9 % 100 mL IVPB  Status:  Discontinued     1 g 200 mL/hr over 30 Minutes Intravenous Every 24 hours 05/15/18 0156 05/18/18 0939   05/14/18 2200  cefTRIAXone (ROCEPHIN) 1 g in sodium chloride 0.9 % 100 mL IVPB     1 g 200 mL/hr over 30 Minutes Intravenous  Once 05/14/18 2157 05/14/18 2302      Subjective:   Seward Meth seen and examined today.  Patient with dementia.  Denies current chest pain, shortness breath, abdominal pain, nausea vomiting, diarrhea constipation.  Has no complaints.   Objective:   Vitals:   05/23/18 2250 05/24/18 0418 05/24/18 0500 05/24/18 1004  BP: (!) 119/93 (!) 138/54  (!) 119/54  Pulse: 60 66    Resp: 18 17  18   Temp: (!) 97.5 F (36.4 C) 97.6 F (36.4 C)  98.2 F (36.8 C)  TempSrc: Oral Oral  Oral  SpO2: 98% 100%    Weight:   53.6 kg   Height:        Intake/Output Summary (Last 24 hours) at 05/24/2018 1118 Last data filed at 05/24/2018 3846 Gross per 24 hour  Intake 240 ml  Output 300 ml  Net -60 ml   Filed Weights   05/20/18 2059 05/21/18 0500 05/24/18 0500  Weight: 53.6 kg 53.6 kg 53.6 kg   Exam  General: Well developed, well nourished, NAD, appears stated age  HEENT: NCAT, mucous membranes moist.   Neck: Supple  Cardiovascular: S1 S2 auscultated, SEM, RRR  Respiratory: Clear to auscultation bilaterally with equal chest rise  Abdomen: Soft, nontender, nondistended, + bowel sounds  Extremities: warm dry without cyanosis clubbing or edema. RUE in sling  Neuro: AAOx1 (self only), nonfocal  Psych: Normal affect and demeanor   Data Reviewed: I have personally reviewed following labs and imaging studies  CBC: Recent Labs  Lab 05/20/18 0848 05/20/18 1316 05/21/18 0601 05/22/18 0704 05/23/18 0657  WBC 18.5* 14.5* 15.7* 14.1* 15.4*  NEUTROABS  --  11.0* 11.1*  --   --   HGB 10.9* 9.8* 10.5* 10.3* 10.7*  HCT 30.9* 29.5* 30.7* 29.0* 30.3*  MCV 93.4 96.7 95.6 93.5 94.7    PLT 206 PLATELET CLUMPS NOTED ON SMEAR, COUNT APPEARS ADEQUATE 146* 136* 659*   Basic Metabolic Panel: Recent Labs  Lab 05/20/18 0535 05/20/18 0848  05/21/18 0253 05/21/18 0601 05/21/18 1232 05/22/18 0704 05/23/18 0657 05/24/18 0541  NA 124* 123*   < > 121* 121* 121* 117* 117* 118*  K 6.2* 4.8   < > 4.9 4.8 4.9 5.0 4.7 4.5  CL 100 97*   < > 95* 96* 92* 91* 89* 90*  CO2 17* 20*   < > 21* 22 25 24 23 24   GLUCOSE 97 98   < > 103* 97 132* 106* 108* 115*  BUN 8 9   < > 10 10 11 12 16 17   CREATININE 0.89 0.88   < > 0.99 1.09* 1.16* 1.20* 1.16* 1.15*  CALCIUM 8.4* 8.2*   < > 8.2* 8.3* 8.4* 8.3* 8.3* 8.3*  MG 1.5* 1.5*  --  2.2  --   --   --   --  1.8   < > = values in this interval not displayed.   GFR: Estimated Creatinine Clearance: 25.5 mL/min (A) (by C-G formula based on SCr of 1.15 mg/dL (H)). Liver Function Tests: No results for input(s): AST, ALT, ALKPHOS, BILITOT, PROT, ALBUMIN  in the last 168 hours. No results for input(s): LIPASE, AMYLASE in the last 168 hours. No results for input(s): AMMONIA in the last 168 hours. Coagulation Profile: No results for input(s): INR, PROTIME in the last 168 hours. Cardiac Enzymes: No results for input(s): CKTOTAL, CKMB, CKMBINDEX, TROPONINI in the last 168 hours. BNP (last 3 results) No results for input(s): PROBNP in the last 8760 hours. HbA1C: No results for input(s): HGBA1C in the last 72 hours. CBG: No results for input(s): GLUCAP in the last 168 hours. Lipid Profile: No results for input(s): CHOL, HDL, LDLCALC, TRIG, CHOLHDL, LDLDIRECT in the last 72 hours. Thyroid Function Tests: No results for input(s): TSH, T4TOTAL, FREET4, T3FREE, THYROIDAB in the last 72 hours. Anemia Panel: No results for input(s): VITAMINB12, FOLATE, FERRITIN, TIBC, IRON, RETICCTPCT in the last 72 hours. Urine analysis:    Component Value Date/Time   COLORURINE YELLOW 05/20/2018 [place] 05/20/2018 1418   APPEARANCEUR [place] 11/19/2012  1750   LABSPEC 1.011 05/20/2018 1418   LABSPEC 1.014 11/19/2012 1750   PHURINE 5.0 05/20/2018 1418   GLUCOSEU NEGATIVE 05/20/2018 1418   GLUCOSEU Negative 11/19/2012 1750   HGBUR SMALL (A) 05/20/2018 1418   BILIRUBINUR NEGATIVE 05/20/2018 1418   BILIRUBINUR negative 05/12/2015 1823   BILIRUBINUR Negative 11/19/2012 [place] 05/20/2018 1418   PROTEINUR NEGATIVE 05/20/2018 1418   UROBILINOGEN 0.2 05/13/2015 0504   NITRITE NEGATIVE 05/20/2018 1418   LEUKOCYTESUR MODERATE (A) 05/20/2018 1418   LEUKOCYTESUR 2+ 11/19/2012 1750   Sepsis Labs: @LABRCNTIP (procalcitonin:4,lacticidven:4)  ) Recent Results (from the past 240 hour(s))  Urine culture     Status: Abnormal   Collection Time: 05/14/18  9:06 PM  Result Value Ref Range Status   Specimen Description URINE, RANDOM  Final   Special Requests   Final    NONE Performed at [facility], [place] [address] [place], [place] [address]    Culture >=100,000 COLONIES/mL ESCHERICHIA COLI (A)  Final   Report Status 05/17/2018 FINAL  Final   Organism ID, Bacteria ESCHERICHIA COLI (A)  Final      Susceptibility   Escherichia coli - MIC*    AMPICILLIN <=2 SENSITIVE Sensitive     CEFAZOLIN <=4 SENSITIVE Sensitive     CEFTRIAXONE <=1 SENSITIVE Sensitive     CIPROFLOXACIN <=0.25 SENSITIVE Sensitive     GENTAMICIN <=1 SENSITIVE Sensitive     IMIPENEM <=0.25 SENSITIVE Sensitive     NITROFURANTOIN <=16 SENSITIVE Sensitive     TRIMETH/SULFA <=20 SENSITIVE Sensitive     AMPICILLIN/SULBACTAM <=2 SENSITIVE Sensitive     PIP/TAZO <=4 SENSITIVE Sensitive     Extended ESBL NEGATIVE Sensitive     * >=100,000 COLONIES/mL ESCHERICHIA COLI  MRSA PCR Screening     Status: None   Collection Time: 05/15/18  8:13 AM  Result Value Ref Range Status   MRSA by PCR NEGATIVE NEGATIVE Final    Comment:        The GeneXpert MRSA Assay (FDA approved for NASAL specimens only), is one component of a comprehensive MRSA  colonization surveillance program. It is not intended to diagnose MRSA infection nor to guide or monitor treatment for MRSA infections. Performed at [facility], [place] [address], [place], [place] [address]   Culture, blood (routine x 2)     Status: None (Preliminary result)   Collection Time: 05/20/18  1:07 PM  Result Value Ref Range Status   Specimen Description BLOOD LEFT ANTECUBITAL  Final  Special Requests   Final    BOTTLES DRAWN AEROBIC ONLY Blood Culture adequate volume   Culture   Final    NO GROWTH 3 DAYS Performed at [facility], [place] [address], [place], [place] [address]    Report Status PENDING  Incomplete  Culture, blood (routine x 2)     Status: None (Preliminary result)   Collection Time: 05/20/18  1:17 PM  Result Value Ref Range Status   Specimen Description BLOOD LEFT HAND  Final   Special Requests   Final    BOTTLES DRAWN AEROBIC ONLY Blood Culture adequate volume   Culture   Final    NO GROWTH 3 DAYS Performed at [facility], [place] [address], [place], [place] [address]    Report Status PENDING  Incomplete  Culture, Urine     Status: None   Collection Time: 05/20/18  4:54 PM  Result Value Ref Range Status   Specimen Description URINE, CLEAN CATCH  Final   Special Requests NONE  Final   Culture   Final    NO GROWTH Performed at [facility], [place] [address] [place], [place] [address]    Report Status 05/21/2018 FINAL  Final      Radiology Studies: No results found.   Scheduled Meds: . atorvastatin  20 mg Oral q1800  . calcium-vitamin D  1 tablet Oral Q breakfast  . carvedilol  6.25 mg Oral BID WC  . cholecalciferol  1,000 Units Oral QHS  . eltrombopag  50 mg Oral Q0600  . lactobacillus acidophilus  2 tablet Oral TID  . multivitamin  1 tablet Oral Daily  . sodium chloride  1 g Oral TID WC   Continuous Infusions:   LOS: 10 days   Time Spent in minutes   30 minutes  Jalee Saine D.O. on 05/24/2018 at 11:18  AM  Between 7am to 7pm - Please see pager noted on [email or web address]  After 7pm go to [email or web address]  And look for the night coverage person covering for me after hours  [facility] Office  [phone]

## 2018-05-25 LAB — BASIC METABOLIC PANEL
Anion gap: 3 — ABNORMAL LOW (ref 5–15)
BUN: 23 mg/dL (ref 8–23)
CHLORIDE: 93 mmol/L — AB (ref 98–111)
CO2: 26 mmol/L (ref 22–32)
CREATININE: 1.1 mg/dL — AB (ref 0.44–1.00)
Calcium: 8.4 mg/dL — ABNORMAL LOW (ref 8.9–10.3)
GFR, EST AFRICAN AMERICAN: 50 mL/min — AB (ref >60)
GFR, EST NON AFRICAN AMERICAN: 44 mL/min — AB (ref >60)
Glucose, Bld: 114 mg/dL — ABNORMAL HIGH (ref 70–99)
Potassium: 4.3 mmol/L (ref 3.5–5.1)
SODIUM: 122 mmol/L — AB (ref 135–145)

## 2018-05-25 NOTE — Progress Notes (Signed)
Occupational Therapy Treatment Patient Details Name: Lisa James MRN: [id number] DOB: November 08, 1927 Today's Date: 05/25/2018    History of present illness Pt is an 84 y/o female admitted from ALF following fall. Unsure of cause, however, imaging revealed R humeral neck fx. Pt is s/p closed reduction. PMH includes MI, dementia, CKD, CAD s/p CABG, CHF, DM, HTN, and subdural hematoma.    OT comments  Pt presents sitting up in recliner and agreeable to working with therapy this session. Pt continues to perseverate on going home throughout session. Pt incontinent of bladder with initial standing requiring totalA for peri-care and for completion of LB ADL. Pt able to perform short distance mobility in room with two person assist. Continue to recommend SNF level therapy services at time of discharge. Will continue to follow acutely.    Follow Up Recommendations  SNF;Supervision/Assistance - 24 hour    Equipment Recommendations  Other (comment)(TBD in next venue)          Precautions / Restrictions Precautions Precautions: Fall Required Braces or Orthoses: Sling(at all times ) Restrictions Weight Bearing Restrictions: Yes RUE Weight Bearing: Non weight bearing       Mobility Bed Mobility               General bed mobility comments: Pt sitting up in recliner upon PT arrival.   Transfers Overall transfer level: Needs assistance Equipment used: 1 person hand held assist(+2 for stability) Transfers: Sit to/from Stand Sit to Stand: Mod assist;+2 physical assistance;+2 safety/equipment         General transfer comment: Heavy mod assist required for power-up to full stand. Increased support required for pt to improve posture and stand up straight.     Balance Overall balance assessment: Needs assistance Sitting-balance support: No upper extremity supported;Feet supported Sitting balance-Leahy Scale: Fair   Postural control: Posterior lean Standing balance support: Single  extremity supported;During functional activity Standing balance-Leahy Scale: Poor Standing balance comment: Reliant on LUE and external support. Heavy posterior lean                           ADL either performed or assessed with clinical judgement   ADL Overall ADL's : Needs assistance/impaired                 Upper Body Dressing : Maximal assistance;Sitting   Lower Body Dressing: Total assistance;+2 for physical assistance;+2 for safety/equipment;Sit to/from stand Lower Body Dressing Details (indicate cue type and reason): donning mesh underwear     Toileting- Clothing Manipulation and Hygiene: Total assistance;+2 for physical assistance;+2 for safety/equipment;Sit to/from stand Toileting - Clothing Manipulation Details (indicate cue type and reason): pt incontinent of bladder upon initial standing; requiring totalA for peri-care      Functional mobility during ADLs: Moderate assistance;+2 for physical assistance       Vision       Perception     Praxis      Cognition Arousal/Alertness: Awake/alert Behavior During Therapy: WFL for tasks assessed/performed Overall Cognitive Status: History of cognitive impairments - at baseline                                 General Comments: Dementia at baseline. Able to follow 1 step commands consistently        Exercises Hand Exercises Digit Composite Flexion: AROM;Right;10 reps;Seated Composite Extension: AROM;10 reps;Right;Seated   Shoulder Instructions  General Comments      Pertinent Vitals/ Pain       Pain Assessment: Faces Faces Pain Scale: Hurts little more Pain Location: R shoulder Pain Descriptors / Indicators: Guarding;Sore Pain Intervention(s): Monitored during session;Repositioned  Home Living                                          Prior Functioning/Environment              Frequency  Min 3X/week        Progress Toward Goals  OT  Goals(current goals can now be found in the care plan section)  Progress towards OT goals: Progressing toward goals  Acute Rehab OT Goals Patient Stated Goal: to go home today OT Goal Formulation: With patient Time For Goal Achievement: 05/31/18 Potential to Achieve Goals: Fair ADL Goals Pt Will Perform Grooming: standing;with min guard assist Pt Will Perform Upper Body Bathing: with min assist;sitting Pt Will Perform Upper Body Dressing: with mod assist;sitting Pt Will Transfer to Toilet: with min assist;bedside commode;stand pivot transfer Pt/caregiver will Perform Home Exercise Program: Increased ROM;Right Upper extremity  Plan Discharge plan remains appropriate    Co-evaluation    PT/OT/SLP Co-Evaluation/Treatment: Yes Reason for Co-Treatment: Complexity of the patient's impairments (multi-system involvement);Necessary to address cognition/behavior during functional activity;For patient/therapist safety;To address functional/ADL transfers PT goals addressed during session: Mobility/safety with mobility;Balance;Strengthening/ROM OT goals addressed during session: ADL's and self-care      AM-PAC PT "6 Clicks" Daily Activity     Outcome Measure   Help from another person eating meals?: A Little Help from another person taking care of personal grooming?: A Little Help from another person toileting, which includes using toliet, bedpan, or urinal?: Total Help from another person bathing (including washing, rinsing, drying)?: A Lot Help from another person to put on and taking off regular upper body clothing?: A Lot Help from another person to put on and taking off regular lower body clothing?: Total 6 Click Score: 12    End of Session Equipment Utilized During Treatment: Gait belt(sling)  OT Visit Diagnosis: Unsteadiness on feet (R26.81);Other abnormalities of gait and mobility (R26.89);Muscle weakness (generalized) (M62.81);Pain Pain - Right/Left: Right Pain - part of body:  Arm;Shoulder   Activity Tolerance Patient tolerated treatment well   Patient Left in chair;with call bell/phone within reach;with chair alarm set   Nurse Communication Mobility status;Other (comment)(need new pure wick)        Time: 1350-1417 OT Time Calculation (min): 27 min  Charges: OT General Charges $OT Visit: 1 Visit OT Treatments $Self Care/Home Management : 8-22 mins  Lou Cal, OT Supplemental Rehabilitation Services Pager [phone] Office [phone]    Raymondo Band 05/25/2018, 3:58 PM

## 2018-05-25 NOTE — Progress Notes (Signed)
Physical Therapy Treatment Patient Details Name: Lisa James MRN: [id number] DOB: 01/03/1928 Today's Date: 05/25/2018    History of Present Illness Pt is an 84 y/o female admitted from ALF following fall. Unsure of cause, however, imaging revealed R humeral neck fx. Pt is s/p closed reduction. PMH includes MI, dementia, CKD, CAD s/p CABG, CHF, DM, HTN, and subdural hematoma.     PT Comments    Pt seen in conjunction with OT to maximize functional progress and safety with mobility. Today, pt perseverating on d/c "any minute" and returning home to [place]. Main focus of session was to progress gait training, and she was able to ambulate ~8 feet with +2 assist for balance support and safety. PT continues to feel SNF level rehab will be the most appropriate disposition at d/c. Will continue to follow.   Follow Up Recommendations  SNF;Supervision/Assistance - 24 hour     Equipment Recommendations  None recommended by PT    Recommendations for Other Services       Precautions / Restrictions Precautions Precautions: Fall Required Braces or Orthoses: Sling(at all times ) Restrictions Weight Bearing Restrictions: Yes RUE Weight Bearing: Non weight bearing    Mobility  Bed Mobility Overal bed mobility: Needs Assistance Bed Mobility: Supine to Sit     Supine to sit: Min assist     General bed mobility comments: Pt sitting up in recliner upon PT arrival.   Transfers Overall transfer level: Needs assistance Equipment used: 1 person hand held assist(+2 for stability) Transfers: Sit to/from Stand Sit to Stand: Mod assist;+2 physical assistance;+2 safety/equipment Stand pivot transfers: Mod assist       General transfer comment: Heavy mod assist required for power-up to full stand. Increased support required for pt to improve posture and stand up straight.   Ambulation/Gait Ambulation/Gait assistance: Mod assist;+2 physical assistance;+2 safety/equipment Gait Distance  (Feet): 8 Feet(4' to [facility], turned, and 4' back to chair. ) Assistive device: 1 person hand held assist(+2 for support/safety) Gait Pattern/deviations: Shuffle;Trunk flexed;Narrow base of support Gait velocity: Decreased Gait velocity interpretation: <1.8 ft/sec, indicate of risk for recurrent falls General Gait Details: Pt was able to ambulate a total of ~8 feet this session. She required +2 assist to maintain upright posture.    Stairs             Wheelchair Mobility    Modified Rankin (Stroke Patients Only)       Balance Overall balance assessment: Needs assistance Sitting-balance support: No upper extremity supported;Feet supported Sitting balance-Leahy Scale: Fair   Postural control: Posterior lean Standing balance support: Single extremity supported;During functional activity Standing balance-Leahy Scale: Poor Standing balance comment: Reliant on LUE and external support. Heavy posterior lean                            Cognition Arousal/Alertness: Awake/alert Behavior During Therapy: WFL for tasks assessed/performed Overall Cognitive Status: History of cognitive impairments - at baseline                                 General Comments: Dementia at baseline. Able to follow 1 step commands consistently      Exercises      General Comments        Pertinent Vitals/Pain Pain Assessment: Faces Faces Pain Scale: Hurts little more Pain Location: R shoulder Pain Descriptors / Indicators: Guarding;Sore Pain Intervention(s): Monitored during session;Repositioned  Home Living                      Prior Function            PT Goals (current goals can now be found in the care plan section) Acute Rehab PT Goals Patient Stated Goal: to go home today PT Goal Formulation: With patient Time For Goal Achievement: 05/29/18 Potential to Achieve Goals: Fair Progress towards PT goals: Progressing toward goals    Frequency     Min 2X/week      PT Plan Current plan remains appropriate    Co-evaluation PT/OT/SLP Co-Evaluation/Treatment: Yes Reason for Co-Treatment: Complexity of the patient's impairments (multi-system involvement);Necessary to address cognition/behavior during functional activity;For patient/therapist safety;To address functional/ADL transfers PT goals addressed during session: Mobility/safety with mobility;Balance;Strengthening/ROM        AM-PAC PT "6 Clicks" Daily Activity  Outcome Measure  Difficulty turning over in bed (including adjusting bedclothes, sheets and blankets)?: Unable Difficulty moving from lying on back to sitting on the side of the bed? : A Lot Difficulty sitting down on and standing up from a chair with arms (e.g., wheelchair, bedside commode, etc,.)?: A Lot Help needed moving to and from a bed to chair (including a wheelchair)?: A Lot Help needed walking in hospital room?: A Lot Help needed climbing 3-5 steps with a railing? : Total 6 Click Score: 10    End of Session Equipment Utilized During Treatment: Gait belt;Other (comment)(RUE sling ) Activity Tolerance: Patient tolerated treatment well Patient left: in chair;with call bell/phone within reach;with chair alarm set Nurse Communication: Mobility status PT Visit Diagnosis: Unsteadiness on feet (R26.81);Muscle weakness (generalized) (M62.81);History of falling (Z91.81)     Time: 6314-9702 PT Time Calculation (min) (ACUTE ONLY): 27 min  Charges:  $Gait Training: 8-22 mins                     Rolinda Roan, PT, DPT Acute Rehabilitation Services Pager: [phone] Office: [phone]    Thelma Comp 05/25/2018, 3:23 PM

## 2018-05-25 NOTE — Progress Notes (Signed)
PROGRESS NOTE    Lisa James  WVP:[id number] DOB: 03-16-1928 DOA: 05/14/2018 PCP: Merrilee Seashore, MD   Brief Narrative:  Admitted for right humeral neck fracture and underwent surgery by orthopedics.  Plan is for discharge to SNF however patients with WBC count rose today, and her sodium worsened. Assessment & Plan   Hyponatremia -On admission sodium was 120 however improved to 132 as of 05/19/2018.  However today patient's sodium level was noted to be down to 115 despite receiving IV fluids. -Discontinued IV fluids -?  Hypovolemic hyponatremia however patient does not appear to be volume overloaded, vs "Tea and toast" -Monitor intake and output, daily weights -Currently mental status does appear to be at baseline, no neurological deficits -given IV lasix -On 10/31: Discussed with Dr. Jimmy Footman, nephrologist, who recommended waiting and watching as well as place patient on fluid restriction -Discussed with Dr. Augustin Coupe, nephro, recommended starting low dose sodium tab for one week -sodium improved today to 122 (patient asymptomatic and neurologically intact) -Continue sodium tablets to TID and will given another small dose of IV lasix (10mg )  -Continue to monitor BMP  Leukocytosis -Upon admission, WBC 26, however had trended down, the back up to 18.5 -WBC today 15.4 -Patient does not appear to have further infective etiology, currently being treated for urinary tract infection -Repeat UA appears improved, urine culture showed no growth.  Blood cultures appear to be negative to date. -Currently afebrile with no complaints of upper respiratory symptoms -Discussed with Dr. Burr Medico, recommended not doing further workup, likely inflammatory  Fall with displaced transverse fracture through the right humeral neck -CT and x-ray of the right shoulder show displaced transverse fracture through the humeral neck -Orthopedic surgery consulted and appreciated, status post closed reduction  under conscious sedation -Orthopedics recommending sling on at all times with outpatient follow-up in 7 days  Acute metabolic encephalopathy in the setting of dementia -Appears to be at baseline, mental status improved -Suspect worsened by UTI -Continue fall precautions -Per family request, Aricept and Namenda have been discontinued as I do not feel it is been helping  Ecoli UTI -Urine culture showed pansensitive E. Coli -Patient was placed on Rocephin and transitioned to oral ciprofloxacin -Given leukocytosis, repeat urine culture on 05/20/2018 showed no growth  Essential Hypertension -Uncontrolled, continue Coreg -Aldactone discontinued, will give IV Lasix  Hypokalemia/Hypocalcemia  -resolved, continue to monitor   Hypomagnesemia -replaced, currently 1.8  History of ITP -Patient follows with Dr. Burr Medico, oncology and was last seen on 04/23/2018 -Placed on Promacta to maintain platelet counts above 50,000 -She is to follow-up with oncology as an outpatient  CAD -Stable, no complaints of chest pain or shortness of breath -Continue Coreg, statin  Chronic kidney disease, stage III -Stable  Pressure injury of skin -Stage II, sacral -Continue wound care  Anemia with thrombocytopenia  -No obvious bleeding -patient is currently on Promacta due to history of ITP -hemoglobin currently 10.7 -platelets 126  DVT Prophylaxis  SCDs  Code Status: Full  Family Communication: Niece at bedside  Disposition Plan: Admitted. Plan to discharge to SNF when sodium level has improved-hopefully on 05/26/2018  Procedures: Closed reduction of right humeral fracture  Consultations: Orthopedic surgery Oncology, Dr. Burr Medico, via phone Nephrology, Dr. Jimmy Footman and Dr. Augustin Coupe, via phone  Antibiotics   Anti-infectives (From admission, onward)   Start     Dose/Rate Route Frequency Ordered Stop   05/18/18 1100  ciprofloxacin (CIPRO) tablet 250 mg     250 mg Oral 2 times daily 05/18/18  9323 05/22/18 2223   05/15/18 2200  cefTRIAXone (ROCEPHIN) 1 g in sodium chloride 0.9 % 100 mL IVPB  Status:  Discontinued     1 g 200 mL/hr over 30 Minutes Intravenous Every 24 hours 05/15/18 0156 05/18/18 0939   05/14/18 2200  cefTRIAXone (ROCEPHIN) 1 g in sodium chloride 0.9 % 100 mL IVPB     1 g 200 mL/hr over 30 Minutes Intravenous  Once 05/14/18 2157 05/14/18 2302      Subjective:   Lisa James seen and examined today.  Dementia.  Denies current chest pain, shortness of breath, abdominal pain, nausea or vomiting, diarrhea or constipation, dizziness or headache.    Objective:   Vitals:   05/24/18 1707 05/24/18 2102 05/25/18 0354 05/25/18 0809  BP: (!) 121/56 102/75  132/80  Pulse: 68 61  65  Resp: 18 18  18   Temp: 98.1 F (36.7 C) 98.3 F (36.8 C)  97.6 F (36.4 C)  TempSrc: Oral Oral  Oral  SpO2: 98% 100%  98%  Weight:  53.6 kg 53.6 kg   Height:        Intake/Output Summary (Last 24 hours) at 05/25/2018 1336 Last data filed at 05/25/2018 0900 Gross per 24 hour  Intake 565 ml  Output 800 ml  Net -235 ml   Filed Weights   05/24/18 0500 05/24/18 2102 05/25/18 0354  Weight: 53.6 kg 53.6 kg 53.6 kg   Exam  General: Well developed, well nourished, NAD, appears stated age  HEENT: NCAT, mucous membranes moist.   Neck: Supple  Cardiovascular: S1 S2 auscultated, RRR, +SEM  Respiratory: Clear to auscultation bilaterally with equal chest rise  Abdomen: Soft, nontender, nondistended, + bowel sounds  Extremities: warm dry without cyanosis clubbing or edema. RUE sling  Neuro: AAOx1 (self), nonfocal. Dementia  Skin: Without rashes exudates or nodules  Psych: appropriate mood and affect, very pleasant  Data Reviewed: I have personally reviewed following labs and imaging studies  CBC: Recent Labs  Lab 05/20/18 0848 05/20/18 1316 05/21/18 0601 05/22/18 0704 05/23/18 0657  WBC 18.5* 14.5* 15.7* 14.1* 15.4*  NEUTROABS  --  11.0* 11.1*  --   --   HGB  10.9* 9.8* 10.5* 10.3* 10.7*  HCT 30.9* 29.5* 30.7* 29.0* 30.3*  MCV 93.4 96.7 95.6 93.5 94.7  PLT 206 PLATELET CLUMPS NOTED ON SMEAR, COUNT APPEARS ADEQUATE 146* 136* 557*   Basic Metabolic Panel: Recent Labs  Lab 05/20/18 0535 05/20/18 0848  05/21/18 0253  05/21/18 1232 05/22/18 0704 05/23/18 0657 05/24/18 0541 05/25/18 0635  NA 124* 123*   < > 121*   < > 121* 117* 117* 118* 122*  K 6.2* 4.8   < > 4.9   < > 4.9 5.0 4.7 4.5 4.3  CL 100 97*   < > 95*   < > 92* 91* 89* 90* 93*  CO2 17* 20*   < > 21*   < > 25 24 23 24 26   GLUCOSE 97 98   < > 103*   < > 132* 106* 108* 115* 114*  BUN 8 9   < > 10   < > 11 12 16 17 23   CREATININE 0.89 0.88   < > 0.99   < > 1.16* 1.20* 1.16* 1.15* 1.10*  CALCIUM 8.4* 8.2*   < > 8.2*   < > 8.4* 8.3* 8.3* 8.3* 8.4*  MG 1.5* 1.5*  --  2.2  --   --   --   --  1.8  --    < > =  values in this interval not displayed.   GFR: Estimated Creatinine Clearance: 26.7 mL/min (A) (by C-G formula based on SCr of 1.1 mg/dL (H)). Liver Function Tests: No results for input(s): AST, ALT, ALKPHOS, BILITOT, PROT, ALBUMIN in the last 168 hours. No results for input(s): LIPASE, AMYLASE in the last 168 hours. No results for input(s): AMMONIA in the last 168 hours. Coagulation Profile: No results for input(s): INR, PROTIME in the last 168 hours. Cardiac Enzymes: No results for input(s): CKTOTAL, CKMB, CKMBINDEX, TROPONINI in the last 168 hours. BNP (last 3 results) No results for input(s): PROBNP in the last 8760 hours. HbA1C: No results for input(s): HGBA1C in the last 72 hours. CBG: No results for input(s): GLUCAP in the last 168 hours. Lipid Profile: No results for input(s): CHOL, HDL, LDLCALC, TRIG, CHOLHDL, LDLDIRECT in the last 72 hours. Thyroid Function Tests: No results for input(s): TSH, T4TOTAL, FREET4, T3FREE, THYROIDAB in the last 72 hours. Anemia Panel: No results for input(s): VITAMINB12, FOLATE, FERRITIN, TIBC, IRON, RETICCTPCT in the last 72  hours. Urine analysis:    Component Value Date/Time   COLORURINE YELLOW 05/20/2018 [place] 05/20/2018 1418   APPEARANCEUR [place] 11/19/2012 1750   LABSPEC 1.011 05/20/2018 1418   LABSPEC 1.014 11/19/2012 1750   PHURINE 5.0 05/20/2018 1418   GLUCOSEU NEGATIVE 05/20/2018 1418   GLUCOSEU Negative 11/19/2012 1750   HGBUR SMALL (A) 05/20/2018 1418   BILIRUBINUR NEGATIVE 05/20/2018 1418   BILIRUBINUR negative 05/12/2015 1823   BILIRUBINUR Negative 11/19/2012 1750   KETONESUR NEGATIVE 05/20/2018 1418   PROTEINUR NEGATIVE 05/20/2018 1418   UROBILINOGEN 0.2 05/13/2015 0504   NITRITE NEGATIVE 05/20/2018 1418   LEUKOCYTESUR MODERATE (A) 05/20/2018 1418   LEUKOCYTESUR 2+ 11/19/2012 1750   Sepsis Labs: @LABRCNTIP (procalcitonin:4,lacticidven:4)  ) Recent Results (from the past 240 hour(s))  Culture, blood (routine x 2)     Status: None (Preliminary result)   Collection Time: 05/20/18  1:07 PM  Result Value Ref Range Status   Specimen Description BLOOD LEFT ANTECUBITAL  Final   Special Requests   Final    BOTTLES DRAWN AEROBIC ONLY Blood Culture adequate volume   Culture   Final    NO GROWTH 4 DAYS Performed at [facility], [place] [address], [place], [place] [address]    Report Status PENDING  Incomplete  Culture, blood (routine x 2)     Status: None (Preliminary result)   Collection Time: 05/20/18  1:17 PM  Result Value Ref Range Status   Specimen Description BLOOD LEFT HAND  Final   Special Requests   Final    BOTTLES DRAWN AEROBIC ONLY Blood Culture adequate volume   Culture   Final    NO GROWTH 4 DAYS Performed at [facility], [place] [address], [place], [place] [address]    Report Status PENDING  Incomplete  Culture, Urine     Status: None   Collection Time: 05/20/18  4:54 PM  Result Value Ref Range Status   Specimen Description URINE, CLEAN CATCH  Final   Special Requests NONE  Final   Culture   Final    NO GROWTH Performed at [facility], [place] [address], [place], [place] [address]    Report Status 05/21/2018 FINAL  Final      Radiology Studies: No results found.   Scheduled Meds: . atorvastatin  20 mg Oral q1800  . calcium-vitamin D  1 tablet Oral Q breakfast  . carvedilol  6.25 mg  Oral BID WC  . cholecalciferol  1,000 Units Oral QHS  . eltrombopag  50 mg Oral Q0600  . lactobacillus acidophilus  2 tablet Oral TID  . multivitamin  1 tablet Oral Daily  . sodium chloride  1 g Oral TID WC   Continuous Infusions:   LOS: 11 days   Time Spent in minutes   30 minutes  Akeelah Seppala D.O. on 05/25/2018 at 1:36 PM  Between 7am to 7pm - Please see pager noted on [email or web address]  After 7pm go to [email or web address]  And look for the night coverage person covering for me after hours  [facility] Office  [phone]

## 2018-05-26 LAB — BASIC METABOLIC PANEL
Anion gap: 5 (ref 5–15)
BUN: 23 mg/dL (ref 8–23)
CO2: 27 mmol/L (ref 22–32)
CREATININE: 1.1 mg/dL — AB (ref 0.44–1.00)
Calcium: 8.6 mg/dL — ABNORMAL LOW (ref 8.9–10.3)
Chloride: 94 mmol/L — ABNORMAL LOW (ref 98–111)
GFR calc Af Amer: 50 mL/min — ABNORMAL LOW (ref >60)
GFR, EST NON AFRICAN AMERICAN: 44 mL/min — AB (ref >60)
GLUCOSE: 100 mg/dL — AB (ref 70–99)
POTASSIUM: 4.6 mmol/L (ref 3.5–5.1)
Sodium: 126 mmol/L — ABNORMAL LOW (ref 135–145)

## 2018-05-26 LAB — MAGNESIUM: Magnesium: 1.9 mg/dL (ref 1.7–2.4)

## 2018-05-26 MED ORDER — BACID PO TABS: 2.0000 | ORAL_TABLET | Freq: Three times a day (TID) | ORAL | Status: DC

## 2018-05-26 NOTE — Clinical Social Work Placement (Signed)
   CLINICAL SOCIAL WORK PLACEMENT  NOTE 05/26/2018 - DISCHARGE TO [facility]  Date:  05/26/2018  Patient Details  Name: Lisa James MRN: [id number] Date of Birth: 07-01-1928  Clinical Social Work is seeking post-discharge placement for this patient at the [place] level of care (*CSW will initial, date and re-position this form in  chart as items are completed):  No(Neice provided facility preferences)   Patient/family provided with [facility] Work Department's list of facilities offering this level of care within the geographic area requested by the patient (or if unable, by the patient's family).  Yes   Patient/family informed of their freedom to choose among providers that offer the needed level of care, that participate in Medicare, Medicaid or managed care program needed by the patient, have an available bed and are willing to accept the patient.  No   Patient/family informed of [facility]'s ownership interest in [facility] and [facility], as well as of the fact that they are under no obligation to receive care at these facilities.  PASRR submitted to EDS on       PASRR number received on       Existing PASRR number confirmed on 05/16/18     FL2 transmitted to all facilities in geographic area requested by pt/family on 05/16/18     FL2 transmitted to all facilities within larger geographic area on       Patient informed that his/her managed care company has contracts with or will negotiate with certain facilities, including the following:        Yes   Patient/family informed of bed offers received.  Patient chooses bed at [place], [place]     Physician recommends and patient chooses bed at      Patient to be transferred to [facility] on 05/26/18.  Patient to be transferred to facility by Ambulance     Patient family notified on 05/26/18 of transfer.  Name of family member notified:   Laqueta Jean, Saddie Benders [phone]     PHYSICIAN       Additional Comment:    _______________________________________________ Fermin Schwab, [facility] Work 05/26/2018, 2:23 PM

## 2018-05-26 NOTE — Progress Notes (Signed)
Patient discharged to [facility]. No signs and symptoms of distress noted. IV out and intact.Kristine RN got report on patient. Dillon Bjork RN

## 2018-05-26 NOTE — Discharge Instructions (Signed)
Hyponatremia Hyponatremia is when the amount of salt (sodium) in your blood is too low. When salt levels are low, your cells absorb extra water and they swell. The swelling happens throughout the body, but it mostly affects the brain. Follow these instructions at home:  Take medicines only as told by your doctor. Many medicines can make this condition worse. Talk with your doctor about any medicines that you are currently taking.  Carefully follow a recommended diet as told by your doctor.  Carefully follow instructions from your doctor about fluid restrictions.  Keep all follow-up visits as told by your doctor. This is important.  Do not drink alcohol. Contact a doctor if:  You feel sicker to your stomach (nauseous).  You feel more confused.  You feel more tired (fatigued).  Your headache gets worse.  You feel weaker.  Your symptoms go away and then they come back.  You have trouble following the diet instructions. Get help right away if:  You start to twitch and shake (have a seizure).  You pass out (faint).  You keep having watery poop (diarrhea).  You keep throwing up (vomiting). This information is not intended to replace advice given to you by your health care provider. Make sure you discuss any questions you have with your health care provider. Document Released: 03/20/2011 Document Revised: 12/14/2015 Document Reviewed: 07/04/2014 Elsevier Interactive Patient Education  2018 Elsevier Inc.  

## 2018-05-27 ENCOUNTER — Other Ambulatory Visit: Payer: Self-pay | Admitting: Hematology

## 2018-05-27 ENCOUNTER — Encounter (INDEPENDENT_AMBULATORY_CARE_PROVIDER_SITE_OTHER): Payer: Self-pay | Admitting: Orthopedic Surgery

## 2018-05-27 ENCOUNTER — Other Ambulatory Visit (INDEPENDENT_AMBULATORY_CARE_PROVIDER_SITE_OTHER): Payer: Self-pay | Admitting: Orthopedic Surgery

## 2018-05-27 ENCOUNTER — Ambulatory Visit (INDEPENDENT_AMBULATORY_CARE_PROVIDER_SITE_OTHER): Payer: Medicare Other

## 2018-05-27 ENCOUNTER — Ambulatory Visit (INDEPENDENT_AMBULATORY_CARE_PROVIDER_SITE_OTHER): Payer: Medicare Other | Admitting: Orthopedic Surgery

## 2018-05-27 DIAGNOSIS — M25511 Pain in right shoulder: Secondary | ICD-10-CM

## 2018-05-27 DIAGNOSIS — S42201P Unspecified fracture of upper end of right humerus, subsequent encounter for fracture with malunion: Secondary | ICD-10-CM

## 2018-05-27 DIAGNOSIS — D693 Immune thrombocytopenic purpura: Secondary | ICD-10-CM

## 2018-05-27 DIAGNOSIS — S42201G Unspecified fracture of upper end of right humerus, subsequent encounter for fracture with delayed healing: Secondary | ICD-10-CM

## 2018-05-27 LAB — CULTURE, BLOOD (ROUTINE X 2)
CULTURE: NO GROWTH
CULTURE: NO GROWTH
SPECIAL REQUESTS: ADEQUATE
Special Requests: ADEQUATE

## 2018-05-27 NOTE — Progress Notes (Signed)
Pt pre-op instructions faxed to [facility] pt nurse, Renee, LPN. Nurse stated that pt does not have an order to check blood glucose, the facility has no record of her being diabetic; nurse made aware that pt has type II diabetes ( diet controlled).  Nurse stated that surgeon's instructions stated that pt could eat until 10:00 A.M the morning of surgery and is scheduled to report to admitting at 4:00 P.M. on DOS. Nurse advised that facility follow surgeon's instructions. Nurse verbalized understanding of all pre-op instructions. Dr. Fransisco Beau, Anesthesia made aware of pt history; MD advised that a BMP be repeated DOS due to pt hyponatremia.

## 2018-05-27 NOTE — Pre-Procedure Instructions (Addendum)
Lisa James  05/27/2018     [place], [place] [address] Phone: [phone] Fax: [phone]   Your procedure is scheduled on Thursday, May 28, 2018  Report to [facility] Admitting at  Call [phone] at 8:00 A.M. for arrival time.  Call this number if you have problems the morning of surgery:  [phone]    Remember:  Do not eat or drink after midnight.  Take these medicines the morning of surgery with A SIP OF WATER : carvedilol (COREG),  PROMACTA   Stop taking vitamins, fish oil and herbal medications. Do not take any NSAIDs ie: Ibuprofen, Advil, Naproxen (Aleve), Motrin, BC and Goody Powder or any medication containing Aspirin; stop now.     How to Manage Your Diabetes Before and After Surgery  Why is it important to control my blood sugar before and after surgery? . Improving blood sugar levels before and after surgery helps healing and can limit problems. . A way of improving blood sugar control is eating a healthy diet by: o  Eating less sugar and carbohydrates o  Increasing activity/exercise o  Talking with your doctor about reaching your blood sugar goals . High blood sugars (greater than 180 mg/dL) can raise your risk of infections and slow your recovery, so you will need to focus on controlling your diabetes during the weeks before surgery. . Make sure that the doctor who takes care of your diabetes knows about your planned surgery including the date and location.  How do I manage my blood sugar before surgery? . Check your blood sugar at least 4 times a day, starting 2 days before surgery, to make sure that the level is not too high or low. o Check your blood sugar the morning of your surgery when you wake up and every 2 hours until you get to the Short Stay unit. . If your blood sugar is less than 70 mg/dL, you will need to treat for low blood  sugar: o Do not take insulin. o Treat a low blood sugar (less than 70 mg/dL) with  cup of clear juice (cranberry or apple), 4 glucose tablets, OR glucose gel. Recheck blood sugar in 15 minutes after treatment (to make sure it is greater than 70 mg/dL). If your blood sugar is not greater than 70 mg/dL on recheck, call [phone] o  for further instructions. . Report your blood sugar to the short stay nurse when you get to Short Stay.  . If you are admitted to the hospital after surgery: o Your blood sugar will be checked by the staff and you will probably be given insulin after surgery (instead of oral diabetes medicines) to make sure you have good blood sugar levels. o The goal for blood sugar control after surgery is 80-180 mg/dL   Reviewed and Endorsed by [facility] Patient Education Committee, August 2015  Do not wear jewelry, make-up or nail polish.  Do not wear lotions, powders, or perfumes, or deodorant.  Do not shave 48 hours prior to surgery.    Do not bring valuables to the hospital.  [facility] is not responsible for any belongings or valuables.  Contacts, dentures or bridgework may not be worn into surgery.  Leave your suitcase in the car.  After surgery it may be brought to your room. Patients discharged the day of surgery will not be allowed to drive home  Please read  over the following fact sheets that you were given.

## 2018-05-27 NOTE — Progress Notes (Signed)
Post-Op Visit Note   Patient: Lisa James           Date of Birth: Jan 15, 1928           MRN: [id number] Visit Date: 05/27/2018 PCP: Merrilee Seashore, MD   Assessment & Plan:  Chief Complaint:  Chief Complaint  Patient presents with  . Right Shoulder - Follow-up   Visit Diagnoses:  1. Right shoulder pain, unspecified chronicity   2. Closed fracture of proximal end of right humerus with malunion, unspecified fracture morphology, subsequent encounter     Plan: Patient presents follow-up right proximal humerus fracture.  She is 10 days out from manipulation.  She had multiple medical problems treated in the hospital.  She is currently in a nursing home.  She states that shoulder is hurting more.  She has been in the sling.  On examination she has palpable radial pulse and resolving ecchymosis in the proximal humeral region.  Radiographs show significantly more displacement in that proximal humerus region.  Shaft is displaced more than one shaft with.  Shoulder is unlikely to heal in its current location.  Impression is displacement of proximal humerus fracture.  Plan is open reduction internal fixation.  This is a somewhat risky proposition in this patient who may end up requiring more extensive surgery.  However I think based on bone quality her best option would be fixation as opposed to reverse shoulder replacement with cement.  That may be required in the future if avascular necrosis occurs but for now I think it is possible that this could heal with locking plate fixation.  Risk and benefits are discussed.  All questions answered.  Plan for surgery tomorrow  Follow-Up Instructions: Return in about 2 weeks (around 06/10/2018).   Orders:  Orders Placed This Encounter  Procedures  . XR Shoulder Right   No orders of the defined types were placed in this encounter.   Imaging: Xr Shoulder Right  Result Date: 05/27/2018 2 views right proximal humerus reviewed.  Significantly  more displacement is present in the 2 part proximal humerus fracture.  Shaft is displaced medially.  The humeral head remains located in the socket   PMFS History: Patient Active Problem List   Diagnosis Date Noted  . Pressure injury of skin 05/15/2018  . Fall 05/14/2018  . Closed comminuted right humeral fracture 05/14/2018  . UTI (urinary tract infection) 05/14/2018  . Hyponatremia 05/14/2018  . Leucocytosis 05/14/2018  . Altered mental status 05/13/2015  . Idiopathic thrombocytopenic purpura ([place]) 07/06/2013  . Aortic valve stenosis 06/19/2013  . Immune thrombocytopenic purpura ([place]) 01/21/2013  . GERD (gastroesophageal reflux disease) 12/17/2012  . Type II or unspecified type diabetes mellitus with renal manifestations, not stated as uncontrolled(250.40) 12/10/2012  . Chronic kidney disease (CKD), stage III (moderate) ([place]) 11/22/2012  . Diabetes mellitus ([place]) 07/03/2011  . Depression 07/03/2011  . Non-Hodgkin's lymphoma ([place]) 07/03/2011  . CAD (coronary artery disease) 07/03/2011  . PAD (peripheral artery disease) ([place]) 07/03/2011  . Vitamin D deficiency 07/03/2011  . Subdural hematoma ([place]) 07/03/2011  . Confusion 05/12/2011  . Subdural hematoma, acute ([place]) 05/12/2011  . HYPERLIPIDEMIA TYPE IIB / III 05/10/2009  . OLD MYOCARDIAL INFARCTION 05/10/2009  . Occlusion and stenosis of carotid artery without mention of cerebral infarction 05/10/2009  . HYPERTENSION, BENIGN 11/03/2008  . MITRAL REGURGITATION 10/29/2008  . ISCHEMIC HEART DISEASE 10/29/2008  . SYSTOLIC HEART FAILURE, CHRONIC 10/29/2008   Past Medical History:  Diagnosis Date  . Acute MI, lateral wall ([place])   .  Aortic stenosis   . Chronic kidney disease   . Chronic systolic heart failure ([place])   . Coronary artery disease    sees Dr Claiborne Billings every 6 months  . Dementia ([place])   . Diabetes ([place])   . DM (diabetes mellitus) ([place])    type 2 ; diet controlled  . Heart disease   . HTN (hypertension)   . Immune  thrombocytopenic purpura ([place]) 01/21/2013  . Ischemic heart disease   . Kidney disease   . Mitral regurgitation   . Non Hodgkin's lymphoma ([place])    of the throat  . Subdural hematoma, post-traumatic ([place]) 2015   sees Dr Sherwood Gambler every 6 months  . Thrombocytopenia ([place])    sees Dr Marin Olp every 2 weeks ;receives Nplate prn    Family History  Problem Relation Age of Onset  . Heart attack Father   . Diabetes Father     Past Surgical History:  Procedure Laterality Date  . BONE MARROW BIOPSY  11/22/2002   left posterior iliac crest bone marrow biopsy and aspirate  . CATARACT EXTRACTION W/ INTRAOCULAR LENS  IMPLANT, BILATERAL Bilateral   . CORONARY ARTERY BYPASS GRAFT  11/20/2007   x5  . EYE SURGERY    . submental lymph node excisional biopsy  10/22/2002   Social History   Occupational History    Comment: retired  Tobacco Use  . Smoking status: Never Smoker  . Smokeless tobacco: Never Used  . Tobacco comment: never used tobacco.  Substance and Sexual Activity  . Alcohol use: No    Alcohol/week: 0.0 standard drinks  . Drug use: No  . Sexual activity: Never    Birth control/protection: Abstinence

## 2018-05-28 ENCOUNTER — Encounter (HOSPITAL_COMMUNITY)
Admission: RE | Disposition: A | Discharge status: Skilled Nursing Facility | Payer: Self-pay | Source: Ambulatory Visit | Attending: Orthopedic Surgery

## 2018-05-28 ENCOUNTER — Inpatient Hospital Stay (HOSPITAL_COMMUNITY): Payer: Medicare Other | Admitting: Anesthesiology

## 2018-05-28 ENCOUNTER — Encounter: Payer: Self-pay | Admitting: Orthopedic Surgery

## 2018-05-28 ENCOUNTER — Inpatient Hospital Stay (HOSPITAL_COMMUNITY): Payer: Medicare Other

## 2018-05-28 ENCOUNTER — Observation Stay (HOSPITAL_COMMUNITY): Payer: Medicare Other

## 2018-05-28 ENCOUNTER — Inpatient Hospital Stay (HOSPITAL_COMMUNITY)
Admission: RE | Admit: 2018-05-28 | Discharge: 2018-06-01 | DRG: 493 | Disposition: A | Discharge status: Skilled Nursing Facility | Payer: Medicare Other | Source: Ambulatory Visit | Attending: Orthopedic Surgery | Admitting: Orthopedic Surgery

## 2018-05-28 ENCOUNTER — Encounter (HOSPITAL_COMMUNITY): Payer: Self-pay | Admitting: Certified Registered Nurse Anesthetist

## 2018-05-28 ENCOUNTER — Other Ambulatory Visit: Payer: Self-pay

## 2018-05-28 DIAGNOSIS — I080 Rheumatic disorders of both mitral and aortic valves: Secondary | ICD-10-CM | POA: Diagnosis present

## 2018-05-28 DIAGNOSIS — Z8572 Personal history of non-Hodgkin lymphomas: Secondary | ICD-10-CM

## 2018-05-28 DIAGNOSIS — Z79899 Other long term (current) drug therapy: Secondary | ICD-10-CM

## 2018-05-28 DIAGNOSIS — I5022 Chronic systolic (congestive) heart failure: Secondary | ICD-10-CM | POA: Diagnosis present

## 2018-05-28 DIAGNOSIS — Z888 Allergy status to other drugs, medicaments and biological substances status: Secondary | ICD-10-CM

## 2018-05-28 DIAGNOSIS — W19XXXA Unspecified fall, initial encounter: Secondary | ICD-10-CM | POA: Diagnosis present

## 2018-05-28 DIAGNOSIS — F0390 Unspecified dementia without behavioral disturbance: Secondary | ICD-10-CM | POA: Diagnosis present

## 2018-05-28 DIAGNOSIS — I252 Old myocardial infarction: Secondary | ICD-10-CM

## 2018-05-28 DIAGNOSIS — S42201A Unspecified fracture of upper end of right humerus, initial encounter for closed fracture: Secondary | ICD-10-CM | POA: Diagnosis present

## 2018-05-28 DIAGNOSIS — Z419 Encounter for procedure for purposes other than remedying health state, unspecified: Secondary | ICD-10-CM

## 2018-05-28 DIAGNOSIS — S42209A Unspecified fracture of upper end of unspecified humerus, initial encounter for closed fracture: Secondary | ICD-10-CM | POA: Diagnosis present

## 2018-05-28 DIAGNOSIS — I110 Hypertensive heart disease with heart failure: Secondary | ICD-10-CM | POA: Diagnosis present

## 2018-05-28 DIAGNOSIS — I2510 Atherosclerotic heart disease of native coronary artery without angina pectoris: Secondary | ICD-10-CM | POA: Diagnosis present

## 2018-05-28 DIAGNOSIS — S42211A Unspecified displaced fracture of surgical neck of right humerus, initial encounter for closed fracture: Principal | ICD-10-CM | POA: Diagnosis present

## 2018-05-28 DIAGNOSIS — S42201G Unspecified fracture of upper end of right humerus, subsequent encounter for fracture with delayed healing: Secondary | ICD-10-CM

## 2018-05-28 DIAGNOSIS — Z951 Presence of aortocoronary bypass graft: Secondary | ICD-10-CM

## 2018-05-28 DIAGNOSIS — E119 Type 2 diabetes mellitus without complications: Secondary | ICD-10-CM | POA: Diagnosis present

## 2018-05-28 HISTORY — PX: ORIF HUMERUS FRACTURE: SHX2126

## 2018-05-28 LAB — BASIC METABOLIC PANEL
Anion gap: 8 (ref 5–15)
BUN: 19 mg/dL (ref 8–23)
CHLORIDE: 92 mmol/L — AB (ref 98–111)
CO2: 27 mmol/L (ref 22–32)
CREATININE: 0.98 mg/dL (ref 0.44–1.00)
Calcium: 8.9 mg/dL (ref 8.9–10.3)
GFR, EST AFRICAN AMERICAN: 58 mL/min — AB (ref >60)
GFR, EST NON AFRICAN AMERICAN: 50 mL/min — AB (ref >60)
Glucose, Bld: 95 mg/dL (ref 70–99)
Potassium: 4.5 mmol/L (ref 3.5–5.1)
SODIUM: 127 mmol/L — AB (ref 135–145)

## 2018-05-28 LAB — GLUCOSE, CAPILLARY
GLUCOSE-CAPILLARY: 96 mg/dL (ref 70–99)
Glucose-Capillary: 102 mg/dL — ABNORMAL HIGH (ref 70–99)

## 2018-05-28 SURGERY — OPEN REDUCTION INTERNAL FIXATION (ORIF) PROXIMAL HUMERUS FRACTURE
Anesthesia: General | Site: Arm Upper | Laterality: Right

## 2018-05-28 MED ORDER — LIDOCAINE HCL (CARDIAC) PF 100 MG/5ML IV SOSY
PREFILLED_SYRINGE | INTRAVENOUS | Status: DC | PRN
  Administered 2018-05-28: 20 mg via INTRAVENOUS

## 2018-05-28 MED ORDER — METOCLOPRAMIDE HCL 5 MG PO TABS: 5.0000 mg | ORAL_TABLET | Freq: Three times a day (TID) | ORAL | Status: DC | PRN

## 2018-05-28 MED ORDER — TRAMADOL HCL 50 MG PO TABS
50.0000 mg | ORAL_TABLET | Freq: Four times a day (QID) | ORAL | Status: DC
  Administered 2018-05-29 (×2): 50 mg via ORAL
  Filled 2018-05-28 (×2): qty 1

## 2018-05-28 MED ORDER — ONDANSETRON HCL 4 MG/2ML IJ SOLN: 4.0000 mg | Freq: Four times a day (QID) | INTRAMUSCULAR | Status: DC | PRN

## 2018-05-28 MED ORDER — LACTATED RINGERS IV SOLN
INTRAVENOUS | Status: AC
  Administered 2018-05-28: 22:00:00 via INTRAVENOUS

## 2018-05-28 MED ORDER — MENTHOL 3 MG MT LOZG: 1.0000 | LOZENGE | OROMUCOSAL | Status: DC | PRN

## 2018-05-28 MED ORDER — VITAMIN D 25 MCG (1000 UNIT) PO TABS
1000.0000 [IU] | ORAL_TABLET | Freq: Every day | ORAL | Status: DC
  Administered 2018-05-28 – 2018-05-31 (×4): 1000 [IU] via ORAL

## 2018-05-28 MED ORDER — ONDANSETRON HCL 4 MG PO TABS: 4.0000 mg | ORAL_TABLET | Freq: Four times a day (QID) | ORAL | Status: DC | PRN

## 2018-05-28 MED ORDER — ATORVASTATIN CALCIUM 20 MG PO TABS
20.0000 mg | ORAL_TABLET | Freq: Every evening | ORAL | Status: DC
Start: 2018-05-28 — End: 2018-06-01
  Administered 2018-05-28 – 2018-05-31 (×4): 20 mg via ORAL
  Filled 2018-05-28 (×4): qty 1

## 2018-05-28 MED ORDER — PROPOFOL 10 MG/ML IV BOLUS
INTRAVENOUS | Status: DC | PRN
  Administered 2018-05-28: 60 mg via INTRAVENOUS

## 2018-05-28 MED ORDER — MIDAZOLAM HCL 2 MG/2ML IJ SOLN
INTRAMUSCULAR | Status: AC
  Filled 2018-05-28: qty 2

## 2018-05-28 MED ORDER — FENTANYL CITRATE (PF) 100 MCG/2ML IJ SOLN
INTRAMUSCULAR | Status: AC
  Administered 2018-05-28: 50 ug via INTRAVENOUS
  Filled 2018-05-28: qty 2

## 2018-05-28 MED ORDER — CARVEDILOL 6.25 MG PO TABS
6.2500 mg | ORAL_TABLET | Freq: Two times a day (BID) | ORAL | Status: DC
  Administered 2018-05-29 – 2018-06-01 (×5): 6.25 mg via ORAL
  Filled 2018-05-28 (×7): qty 1

## 2018-05-28 MED ORDER — EPHEDRINE 5 MG/ML INJ
INTRAVENOUS | Status: AC
  Filled 2018-05-28: qty 10

## 2018-05-28 MED ORDER — BUPIVACAINE HCL (PF) 0.5 % IJ SOLN
INTRAMUSCULAR | Status: DC | PRN
  Administered 2018-05-28: 5 mL via PERINEURAL

## 2018-05-28 MED ORDER — 0.9 % SODIUM CHLORIDE (POUR BTL) OPTIME
TOPICAL | Status: DC | PRN
  Administered 2018-05-28 (×3): 1000 mL

## 2018-05-28 MED ORDER — METOCLOPRAMIDE HCL 5 MG/ML IJ SOLN: 5.0000 mg | Freq: Three times a day (TID) | INTRAMUSCULAR | Status: DC | PRN

## 2018-05-28 MED ORDER — METHOCARBAMOL 1000 MG/10ML IJ SOLN
500.0000 mg | Freq: Four times a day (QID) | INTRAVENOUS | Status: DC | PRN
  Filled 2018-05-28: qty 5

## 2018-05-28 MED ORDER — PHENYLEPHRINE HCL 10 MG/ML IJ SOLN
INTRAMUSCULAR | Status: DC | PRN
  Administered 2018-05-28: 80 ug via INTRAVENOUS

## 2018-05-28 MED ORDER — SERTRALINE HCL 25 MG PO TABS
25.0000 mg | ORAL_TABLET | Freq: Every day | ORAL | Status: DC
  Administered 2018-05-29 – 2018-06-01 (×4): 25 mg via ORAL
  Filled 2018-05-28 (×4): qty 1

## 2018-05-28 MED ORDER — BACID PO TABS
2.0000 | ORAL_TABLET | Freq: Three times a day (TID) | ORAL | Status: DC
  Administered 2018-05-28 – 2018-06-01 (×11): 2 via ORAL
  Filled 2018-05-28 (×13): qty 2

## 2018-05-28 MED ORDER — CEFAZOLIN SODIUM-DEXTROSE 2-4 GM/100ML-% IV SOLN
2.0000 g | INTRAVENOUS | Status: AC
  Administered 2018-05-28: 2 g via INTRAVENOUS

## 2018-05-28 MED ORDER — METHOCARBAMOL 500 MG PO TABS: 500.0000 mg | ORAL_TABLET | Freq: Four times a day (QID) | ORAL | Status: DC | PRN

## 2018-05-28 MED ORDER — CHLORHEXIDINE GLUCONATE 4 % EX LIQD: 60.0000 mL | Freq: Once | CUTANEOUS | Status: DC

## 2018-05-28 MED ORDER — ONDANSETRON HCL 4 MG/2ML IJ SOLN
INTRAMUSCULAR | Status: AC
  Filled 2018-05-28: qty 2

## 2018-05-28 MED ORDER — SUGAMMADEX SODIUM 200 MG/2ML IV SOLN
INTRAVENOUS | Status: DC | PRN
  Administered 2018-05-28: 200 mg via INTRAVENOUS

## 2018-05-28 MED ORDER — DOCUSATE SODIUM 100 MG PO CAPS
100.0000 mg | ORAL_CAPSULE | Freq: Two times a day (BID) | ORAL | Status: DC
  Administered 2018-05-28 – 2018-05-31 (×6): 100 mg via ORAL
  Filled 2018-05-28 (×6): qty 1

## 2018-05-28 MED ORDER — ELTROMBOPAG OLAMINE 50 MG PO TABS: 50.0000 mg | ORAL_TABLET | Freq: Every day | ORAL | Status: DC

## 2018-05-28 MED ORDER — ONDANSETRON HCL 4 MG/2ML IJ SOLN
INTRAMUSCULAR | Status: DC | PRN
  Administered 2018-05-28: 4 mg via INTRAVENOUS

## 2018-05-28 MED ORDER — FENTANYL CITRATE (PF) 100 MCG/2ML IJ SOLN
50.0000 ug | Freq: Once | INTRAMUSCULAR | Status: AC
  Administered 2018-05-28: 50 ug via INTRAVENOUS

## 2018-05-28 MED ORDER — CEFAZOLIN SODIUM-DEXTROSE 2-4 GM/100ML-% IV SOLN
INTRAVENOUS | Status: AC
  Filled 2018-05-28: qty 100

## 2018-05-28 MED ORDER — PHENYLEPHRINE 40 MCG/ML (10ML) SYRINGE FOR IV PUSH (FOR BLOOD PRESSURE SUPPORT)
PREFILLED_SYRINGE | INTRAVENOUS | Status: AC
  Filled 2018-05-28: qty 10

## 2018-05-28 MED ORDER — PHENOL 1.4 % MT LIQD: 1.0000 | OROMUCOSAL | Status: DC | PRN

## 2018-05-28 MED ORDER — MORPHINE SULFATE (PF) 2 MG/ML IV SOLN: 0.5000 mg | INTRAVENOUS | Status: DC | PRN

## 2018-05-28 MED ORDER — FENTANYL CITRATE (PF) 100 MCG/2ML IJ SOLN
INTRAMUSCULAR | Status: DC | PRN
  Administered 2018-05-28: 50 ug via INTRAVENOUS

## 2018-05-28 MED ORDER — TRAZODONE HCL 50 MG PO TABS
50.0000 mg | ORAL_TABLET | Freq: Every evening | ORAL | Status: DC | PRN
  Filled 2018-05-28: qty 1

## 2018-05-28 MED ORDER — CALCIUM CARBONATE-VITAMIN D 500-200 MG-UNIT PO TABS
1.0000 | ORAL_TABLET | Freq: Every day | ORAL | Status: DC
  Administered 2018-05-29 – 2018-06-01 (×4): 1 via ORAL
  Filled 2018-05-28 (×4): qty 1

## 2018-05-28 MED ORDER — LACTATED RINGERS IV SOLN: Freq: Once | INTRAVENOUS | Status: DC

## 2018-05-28 MED ORDER — HYDROCODONE-ACETAMINOPHEN 5-325 MG PO TABS: 1.0000 | ORAL_TABLET | Freq: Four times a day (QID) | ORAL | Status: DC | PRN

## 2018-05-28 MED ORDER — BUPIVACAINE LIPOSOME 1.3 % IJ SUSP
INTRAMUSCULAR | Status: DC | PRN
  Administered 2018-05-28: 10 mL via PERINEURAL

## 2018-05-28 MED ORDER — SODIUM CHLORIDE 0.9 % IV SOLN
INTRAVENOUS | Status: DC | PRN
  Administered 2018-05-28: 50 ug/min via INTRAVENOUS

## 2018-05-28 MED ORDER — ROCURONIUM BROMIDE 50 MG/5ML IV SOSY
PREFILLED_SYRINGE | INTRAVENOUS | Status: DC | PRN
  Administered 2018-05-28: 40 mg via INTRAVENOUS

## 2018-05-28 MED ORDER — EPHEDRINE SULFATE 50 MG/ML IJ SOLN
INTRAMUSCULAR | Status: DC | PRN
  Administered 2018-05-28: 10 mg via INTRAVENOUS
  Administered 2018-05-28: 20 mg via INTRAVENOUS

## 2018-05-28 MED ORDER — ASPIRIN EC 81 MG PO TBEC
81.0000 mg | DELAYED_RELEASE_TABLET | Freq: Two times a day (BID) | ORAL | Status: DC
  Administered 2018-05-28 – 2018-06-01 (×8): 81 mg via ORAL
  Filled 2018-05-28 (×8): qty 1

## 2018-05-28 MED ORDER — CEFAZOLIN SODIUM-DEXTROSE 1-4 GM/50ML-% IV SOLN
1.0000 g | Freq: Four times a day (QID) | INTRAVENOUS | Status: AC
  Administered 2018-05-28 – 2018-05-29 (×2): 1 g via INTRAVENOUS
  Filled 2018-05-28 (×2): qty 50

## 2018-05-28 MED ORDER — FENTANYL CITRATE (PF) 250 MCG/5ML IJ SOLN
INTRAMUSCULAR | Status: AC
  Filled 2018-05-28: qty 5

## 2018-05-28 MED ORDER — LACTATED RINGERS IV SOLN
INTRAVENOUS | Status: DC | PRN
  Administered 2018-05-28: 18:00:00 via INTRAVENOUS

## 2018-05-28 MED FILL — PROMACTA 50 MG TABLET: 50 | 30 days supply | Qty: 30 | Fill #0

## 2018-05-28 SURGICAL SUPPLY — 79 items
APL SKNCLS STERI-STRIP NONHPOA (GAUZE/BANDAGES/DRESSINGS) ×1
BANDAGE ACE 4X5 VEL STRL LF (GAUZE/BANDAGES/DRESSINGS) IMPLANT
BANDAGE ACE 6X5 VEL STRL LF (GAUZE/BANDAGES/DRESSINGS) IMPLANT
BENZOIN TINCTURE PRP APPL 2/3 (GAUZE/BANDAGES/DRESSINGS) ×3 IMPLANT
BIT DRILL 3.2 (BIT) ×3
BIT DRILL 3.2XCALB NS DISP (BIT) IMPLANT
BIT DRILL CALIBRATED 2.7 (BIT) ×1 IMPLANT
BIT DRILL CALIBRATED 2.7MM (BIT) ×1
BIT DRL 3.2XCALB NS DISP (BIT) ×1
BNDG COHESIVE 4X5 TAN STRL (GAUZE/BANDAGES/DRESSINGS) ×3 IMPLANT
BNDG COHESIVE 6X5 TAN STRL LF (GAUZE/BANDAGES/DRESSINGS) ×3 IMPLANT
CHLORAPREP W/TINT 26ML (MISCELLANEOUS) ×4 IMPLANT
CLOSURE WOUND 1/2 X4 (GAUZE/BANDAGES/DRESSINGS) ×1
COVER SURGICAL LIGHT HANDLE (MISCELLANEOUS) ×3 IMPLANT
COVER WAND RF STERILE (DRAPES) ×3 IMPLANT
DRAIN PENROSE 1/2X12 LTX STRL (WOUND CARE) IMPLANT
DRAPE C-ARM 42X72 X-RAY (DRAPES) ×3 IMPLANT
DRAPE IMP U-DRAPE 54X76 (DRAPES) ×3 IMPLANT
DRAPE INCISE IOBAN 66X45 STRL (DRAPES) ×2 IMPLANT
DRAPE U-SHAPE 47X51 STRL (DRAPES) ×5 IMPLANT
DRSG AQUACEL AG ADV 3.5X10 (GAUZE/BANDAGES/DRESSINGS) ×2 IMPLANT
DRSG PAD ABDOMINAL 8X10 ST (GAUZE/BANDAGES/DRESSINGS) IMPLANT
DURAPREP 26ML APPLICATOR (WOUND CARE) IMPLANT
ELECT REM PT RETURN 9FT ADLT (ELECTROSURGICAL) ×3
ELECTRODE REM PT RTRN 9FT ADLT (ELECTROSURGICAL) ×1 IMPLANT
FACESHIELD WRAPAROUND (MASK) ×3 IMPLANT
FACESHIELD WRAPAROUND OR TEAM (MASK) ×1 IMPLANT
GAUZE SPONGE 4X4 12PLY STRL (GAUZE/BANDAGES/DRESSINGS) ×2 IMPLANT
GAUZE XEROFORM 5X9 LF (GAUZE/BANDAGES/DRESSINGS) IMPLANT
GLOVE BIOGEL PI IND STRL 8 (GLOVE) ×1 IMPLANT
GLOVE BIOGEL PI INDICATOR 8 (GLOVE) ×2
GLOVE SURG ORTHO 8.0 STRL STRW (GLOVE) ×3 IMPLANT
GOWN STRL REUS W/ TWL LRG LVL3 (GOWN DISPOSABLE) ×2 IMPLANT
GOWN STRL REUS W/ TWL XL LVL3 (GOWN DISPOSABLE) ×1 IMPLANT
GOWN STRL REUS W/TWL LRG LVL3 (GOWN DISPOSABLE) ×6
GOWN STRL REUS W/TWL XL LVL3 (GOWN DISPOSABLE) ×3
K-WIRE 2X5 SS THRDED S3 (WIRE) ×12
KIT BASIN OR (CUSTOM PROCEDURE TRAY) ×3 IMPLANT
KIT TURNOVER KIT B (KITS) ×3 IMPLANT
KWIRE 2X5 SS THRDED S3 (WIRE) IMPLANT
MANIFOLD NEPTUNE II (INSTRUMENTS) ×3 IMPLANT
NEEDLE 21X1 OR PACK (NEEDLE) IMPLANT
NS IRRIG 1000ML POUR BTL (IV SOLUTION) ×7 IMPLANT
PACK SHOULDER (CUSTOM PROCEDURE TRAY) ×3 IMPLANT
PACK UNIVERSAL I (CUSTOM PROCEDURE TRAY) ×1 IMPLANT
PAD ARMBOARD 7.5X6 YLW CONV (MISCELLANEOUS) ×6 IMPLANT
PAD CAST 4YDX4 CTTN HI CHSV (CAST SUPPLIES) IMPLANT
PADDING CAST COTTON 4X4 STRL (CAST SUPPLIES)
PEG LOCKING 3.2MMX24MM (Peg) ×4 IMPLANT
PEG LOCKING 3.2MMX26MM (Peg) ×2 IMPLANT
PEG LOCKING 3.2MMX46 (Peg) ×2 IMPLANT
PEG LOCKING 3.2X 28MM (Peg) ×2 IMPLANT
PEG LOCKING 3.2X32 (Peg) ×4 IMPLANT
PEG LOCKING 3.2X36 (Screw) ×3 IMPLANT
PENCIL BUTTON HOLSTER BLD 10FT (ELECTRODE) IMPLANT
PLATE PROX HUM HI R 3H 80 (Plate) ×3 IMPLANT
PUTTY DBM STAGRAFT PLUS 5CC (Putty) ×2 IMPLANT
SCREW LOCK CORT STAR 3.5X22 (Screw) ×2 IMPLANT
SCREW LP NL T15 3.5X22 (Screw) ×6 IMPLANT
SCREW T15 MD 3.5X46MM NS (Screw) ×2 IMPLANT
SLEEVE MEASURING 3.2 (BIT) ×3 IMPLANT
SLING ARM IMMOBILIZER LRG (SOFTGOODS) ×2 IMPLANT
SPONGE LAP 18X18 X RAY DECT (DISPOSABLE) ×2 IMPLANT
SPONGE LAP 4X18 RFD (DISPOSABLE) ×2 IMPLANT
STAPLER VISISTAT 35W (STAPLE) IMPLANT
STOCKINETTE IMPERVIOUS 9X36 MD (GAUZE/BANDAGES/DRESSINGS) ×3 IMPLANT
STRIP CLOSURE SKIN 1/2X4 (GAUZE/BANDAGES/DRESSINGS) ×1 IMPLANT
SUCTION FRAZIER HANDLE 10FR (MISCELLANEOUS) ×2
SUCTION TUBE FRAZIER 10FR DISP (MISCELLANEOUS) IMPLANT
SUT MNCRL AB 3-0 PS2 18 (SUTURE) ×2 IMPLANT
SUT VIC AB 0 CT1 27 (SUTURE) ×15
SUT VIC AB 0 CT1 27XBRD ANBCTR (SUTURE) IMPLANT
SUT VIC AB 2-0 CTB1 (SUTURE) ×4 IMPLANT
SUT VICRYL 0 UR6 27IN ABS (SUTURE) ×4 IMPLANT
TOWEL OR 17X24 6PK STRL BLUE (TOWEL DISPOSABLE) ×3 IMPLANT
TOWEL OR 17X26 10 PK STRL BLUE (TOWEL DISPOSABLE) ×3 IMPLANT
TUBE CONNECTING 12'X1/4 (SUCTIONS)
TUBE CONNECTING 12X1/4 (SUCTIONS) IMPLANT
YANKAUER SUCT BULB TIP NO VENT (SUCTIONS) IMPLANT

## 2018-05-28 NOTE — Anesthesia Procedure Notes (Signed)
Anesthesia Regional Block: Interscalene brachial plexus block   Pre-Anesthetic Checklist: ,, timeout performed, Correct Patient, Correct Site, Correct Laterality, Correct Procedure, Correct Position, site marked, Risks and benefits discussed,  Surgical consent,  Pre-op evaluation,  At surgeon's request and post-op pain management  Laterality: Right  Prep: chloraprep       Needles:  Injection technique: Single-shot  Needle Type: Echogenic Stimulator Needle     Needle Length: 5cm  Needle Gauge: 22     Additional Needles:   Procedures:, nerve stimulator,,,,,,,   Nerve Stimulator or Paresthesia:  Response: biceps flexion, 0.45 mA,   Additional Responses:   Narrative:  Start time: 05/28/2018 5:24 PM End time: 05/28/2018 5:32 PM Injection made incrementally with aspirations every 5 mL.  Performed by: Personally  Anesthesiologist: Albertha Ghee, MD  Additional Notes: Functioning IV was confirmed and monitors were applied.  A 47mm 22ga Arrow echogenic stimulator needle was used. Sterile prep and drape,hand hygiene and sterile gloves were used.  Negative aspiration and negative test dose prior to incremental administration of local anesthetic. The patient tolerated the procedure well.  Ultrasound guidance: relevent anatomy identified, needle position confirmed, local anesthetic spread visualized around nerve(s), vascular puncture avoided.  Image printed for medical record.

## 2018-05-28 NOTE — H&P (Signed)
Lisa James is an 84 y.o. female.   Chief Complaint: Right shoulder pain HPI: Lisa James is an 84 year old patient with right shoulder pain.  She sustained a fall about 10 days ago.  Closed reduction was performed in the emergency room which appeared to be a good stable reduction.  However subsequently in clinic yesterday she returned and was noted to have significant displacement of the shaft in relation to the humeral head.  The humeral head remains located in the socket.  Patient presents now for operative management after isolation risk and benefits.  She has been medically stabilized since her initial hospitalization.  Past Medical History:  Diagnosis Date  . Acute MI, lateral wall ([place])   . Aortic stenosis   . Chronic kidney disease   . Chronic systolic heart failure ([place])   . Coronary artery disease    sees Dr Claiborne Billings every 6 months  . Dementia ([place])   . Diabetes ([place])   . DM (diabetes mellitus) ([place])    type 2 ; diet controlled  . Heart disease   . HTN (hypertension)   . 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 Ischemic heart disease   . Kidney disease   . Mitral regurgitation   . Non Hodgkin's lymphoma ([place])    of the throat  . Subdural hematoma, post-traumatic ([place]) 2015   sees Dr Sherwood Gambler every 6 months  . Thrombocytopenia ([place])    sees Dr Marin Olp every 2 weeks ;receives Nplate prn    Past Surgical History:  Procedure Laterality Date  . BONE MARROW BIOPSY  11/22/2002   left posterior iliac crest bone marrow biopsy and aspirate  . CATARACT EXTRACTION W/ INTRAOCULAR LENS  IMPLANT, BILATERAL Bilateral   . CORONARY ARTERY BYPASS GRAFT  11/20/2007   x5  . EYE SURGERY    . submental lymph node excisional biopsy  10/22/2002    Family History  Problem Relation Age of Onset  . Heart attack Father   . Diabetes Father    Social History:  reports that she has never smoked. She has never used smokeless tobacco. She reports that she does not drink alcohol or use  drugs.  Allergies:  Allergies  Allergen Reactions  . Ace Inhibitors Cough    Medications Prior to Admission  Medication Sig Dispense Refill  . sertraline (ZOLOFT) 25 MG tablet Take 25 mg by mouth daily.    Marland Kitchen atorvastatin (LIPITOR) 20 MG tablet TAKE 1 TABLET (20 MG TOTAL) BY MOUTH DAILY. (Patient taking differently: Take 20 mg by mouth every evening. ) 30 tablet 0  . Calcium Carbonate-Vitamin D (CALCIUM-VITAMIN D) 500-200 MG-UNIT per tablet Take 1 tablet by mouth daily.     . carvedilol (COREG) 6.25 MG tablet Take 1 tablet (6.25 mg total) by mouth 2 (two) times daily with a meal. 180 tablet 1  . Cholecalciferol (VITAMIN D) 1000 UNITS capsule Take 1,000 Units by mouth at bedtime.     . lactobacillus acidophilus (BACID) TABS tablet Take 2 tablets by mouth 3 (three) times daily.    . Multiple Vitamins-Minerals (PRESERVISION AREDS PO) Take 1 tablet by mouth daily.    Marland Kitchen PROMACTA 50 MG tablet TAKE 1 TABLET (50 MG TOTAL) BY MOUTH DAILY AT 6 AM. TAKE ON AN EMPTY STOMACH 1 HR BEFORE OR 2 HRS AFTER FOOD, SEPARATE FROM VITAMINS 30 tablet 0  . traZODone (DESYREL) 50 MG tablet Take 50-100 mg by mouth at bedtime as needed for sleep.      Results for orders placed  or performed during the hospital encounter of 05/28/18 (from the past 48 hour(s))  Glucose, capillary     Status: None   Collection Time: 05/28/18  4:37 PM  Result Value Ref Range   Glucose-Capillary 96 70 - 99 mg/dL   Xr Shoulder Right  Result Date: 05/27/2018 2 views right proximal humerus reviewed.  Significantly more displacement is present in the 2 part proximal humerus fracture.  Shaft is displaced medially.  The humeral head remains located in the socket   Review of Systems  Musculoskeletal: Positive for joint pain.  Psychiatric/Behavioral: Positive for memory loss.  All other systems reviewed and are negative.   Blood pressure (!) 194/87, pulse 68, temperature 98.3 F (36.8 C), temperature source Oral, resp. rate 18, height  '5\' 1"'  (1.549 m), weight 53.6 kg, SpO2 100 %. Physical Exam  Constitutional: She appears well-developed.  HENT:  Head: Normocephalic.  Eyes: Pupils are equal, round, and reactive to light.  Neck: Normal range of motion.  Cardiovascular: Normal rate.  Respiratory: Effort normal.  Neurological: She is alert.  Skin: Skin is warm.  Psychiatric: She has a normal mood and affect.  Examination of the right shoulder demonstrates ecchymosis and bruising.  Radial pulses intact.  Patient has what appears to be functional deltoid but it is difficult to assess.  She does have good biceps and triceps function.  Elbow range of motion intact  Assessment/Plan Impression is re-displacement of previously reduced proximal humerus fracture.  This appears to be a 2 part proximal humerus fracture.  Plan is open reduction internal fixation.  We will plan to keep the screw length on the shoulder side in case of avascular necrosis and collapse occurs.  Risk and benefits are discussed including but limited to infection nerve vessel damage as well as potential need for more surgery.  All questions answered.  Anticipate overnight hospitalization with discharge back to skilled nursing tomorrow  Anderson Malta, MD 05/28/2018, 5:04 PM

## 2018-05-28 NOTE — Transfer of Care (Signed)
Immediate Anesthesia Transfer of Care Note  Patient: Lisa James  Procedure(s) Performed: RIGHT OPEN REDUCTION INTERNAL FIXATION (ORIF) PROXIMAL HUMERUS FRACTURE (Right Arm Upper)  Patient Location: PACU  Anesthesia Type:General  Level of Consciousness: awake  Airway & Oxygen Therapy: Patient Spontanous Breathing  Post-op Assessment: Report given to RN and Post -op Vital signs reviewed and stable  Post vital signs: Reviewed and stable  Last Vitals:  Vitals Value Taken Time  BP 139/98 05/28/2018  8:17 PM  Temp    Pulse 56 05/28/2018  8:31 PM  Resp 12 05/28/2018  8:31 PM  SpO2 97 % 05/28/2018  8:31 PM  Vitals shown include unvalidated device data.  Last Pain:  Vitals:   05/28/18 1628  TempSrc: Oral         Complications: No apparent anesthesia complications

## 2018-05-28 NOTE — Brief Op Note (Signed)
05/28/2018  8:19 PM  PATIENT:  Lisa James  84 y.o. female  PRE-OPERATIVE DIAGNOSIS:  RIGHT PROXIMAL HUMERUS FRACTURE  POST-OPERATIVE DIAGNOSIS:  RIGHT PROXIMAL HUMERUS FRACTURE  PROCEDURE:  Procedure(s): RIGHT OPEN REDUCTION INTERNAL FIXATION (ORIF) PROXIMAL HUMERUS FRACTURE  SURGEON:  Surgeon(s): Marlou Sa, Tonna Corner, MD  ASSISTANT: Carlota Raspberry rnfa  ANESTHESIA:   general  EBL: 150 ml    Total I/O In: -  Out: 150 [Blood:150]  BLOOD ADMINISTERED: none  DRAINS: none   LOCAL MEDICATIONS USED:  none  SPECIMEN:  No Specimen  COUNTS:  YES  TOURNIQUET:  * No tourniquets in log *  DICTATION: .Other Dictation: Dictation Number [id number]  PLAN OF CARE: Admit for overnight observation  PATIENT DISPOSITION:  PACU - hemodynamically stable

## 2018-05-28 NOTE — Anesthesia Postprocedure Evaluation (Signed)
Anesthesia Post Note  Patient: DIA DONATE  Procedure(s) Performed: RIGHT OPEN REDUCTION INTERNAL FIXATION (ORIF) PROXIMAL HUMERUS FRACTURE (Right Arm Upper)     Patient location during evaluation: PACU Anesthesia Type: General and Regional Level of consciousness: awake and alert Pain management: pain level controlled Vital Signs Assessment: post-procedure vital signs reviewed and stable Respiratory status: spontaneous breathing, nonlabored ventilation and respiratory function stable Cardiovascular status: blood pressure returned to baseline and stable Postop Assessment: no apparent nausea or vomiting Anesthetic complications: no    Last Vitals:  Vitals:   05/28/18 2121 05/28/18 2145  BP:  (!) 124/50  Pulse:  60  Resp:  18  Temp: 36.5 C 36.6 C  SpO2:  98%    Last Pain:  Vitals:   05/28/18 2145  TempSrc: Oral  PainSc:                  Earvin Blazier,W. EDMOND

## 2018-05-28 NOTE — Anesthesia Preprocedure Evaluation (Addendum)
Anesthesia Evaluation  Patient identified by MRN, date of birth, ID band Patient awake and Patient confused    Reviewed: Allergy & Precautions, NPO status , Patient's Chart, lab work & pertinent test results  History of Anesthesia Complications Negative for: history of anesthetic complications  Airway Mallampati: II  TM Distance: >3 FB Neck ROM: Full    Dental  (+) Edentulous Upper, Edentulous Lower   Pulmonary neg pulmonary ROS,    breath sounds clear to auscultation       Cardiovascular hypertension, Pt. on medications (-) angina+ CAD, + Past MI, + CABG and + Peripheral Vascular Disease  + Valvular Problems/Murmurs MR  Rhythm:Regular Rate:Normal  '18 ECHO: EF 60-65%, mild, possibly moderate aortic stenosis (mild by mean gradient, moderate visually and by calculated valve area). mild AI. Mean gradient (S): 13 mm Hg. Valve area (VTI): 1.08 cm^2, mod MR.   Neuro/Psych Depression Dementia S/p SDH x2    GI/Hepatic Neg liver ROS, GERD  Controlled,  Endo/Other  diabetes (diet controlled)  Renal/GU negative Renal ROS     Musculoskeletal  (+) Arthritis ,   Abdominal   Peds  Hematology Non-Hodgkin's lymphoma plt 126k, Hb 10.7   Anesthesia Other Findings   Reproductive/Obstetrics                            Anesthesia Physical Anesthesia Plan  ASA: III  Anesthesia Plan: General   Post-op Pain Management: GA combined w/ Regional for post-op pain   Induction: Intravenous  PONV Risk Score and Plan: 3 and Ondansetron and Dexamethasone  Airway Management Planned: Oral ETT  Additional Equipment:   Intra-op Plan:   Post-operative Plan: Extubation in OR  Informed Consent: I have reviewed the patients History and Physical, chart, labs and discussed the procedure including the risks, benefits and alternatives for the proposed anesthesia with the patient or authorized representative who has  indicated his/her understanding and acceptance.   Dental advisory given  Plan Discussed with: CRNA and Surgeon  Anesthesia Plan Comments: (Plan routine monitors, GETA with interscalene block for post op analgesia)        Anesthesia Quick Evaluation

## 2018-05-28 NOTE — Anesthesia Procedure Notes (Signed)
Procedure Name: Intubation Date/Time: 05/28/2018 5:54 PM Performed by: Purvis Kilts, CRNA Pre-anesthesia Checklist: Patient identified, Emergency Drugs available, Suction available, Patient being monitored and Timeout performed Patient Re-evaluated:Patient Re-evaluated prior to induction Oxygen Delivery Method: Circle system utilized Preoxygenation: Pre-oxygenation with 100% oxygen Induction Type: IV induction Ventilation: Mask ventilation without difficulty Laryngoscope Size: Mac and 3 Grade View: Grade I Tube type: Oral Tube size: 6.5 mm Number of attempts: 1 Airway Equipment and Method: Stylet Placement Confirmation: ETT inserted through vocal cords under direct vision,  positive ETCO2 and breath sounds checked- equal and bilateral Secured at: 22 cm Tube secured with: Tape Dental Injury: Teeth and Oropharynx as per pre-operative assessment

## 2018-05-29 ENCOUNTER — Encounter (HOSPITAL_COMMUNITY): Payer: Self-pay | Admitting: Orthopedic Surgery

## 2018-05-29 LAB — BASIC METABOLIC PANEL
Anion gap: 8 (ref 5–15)
BUN: 18 mg/dL (ref 8–23)
CHLORIDE: 97 mmol/L — AB (ref 98–111)
CO2: 23 mmol/L (ref 22–32)
Calcium: 8 mg/dL — ABNORMAL LOW (ref 8.9–10.3)
Creatinine, Ser: 1.12 mg/dL — ABNORMAL HIGH (ref 0.44–1.00)
GFR calc non Af Amer: 43 mL/min — ABNORMAL LOW (ref >60)
GFR, EST AFRICAN AMERICAN: 49 mL/min — AB (ref >60)
Glucose, Bld: 86 mg/dL (ref 70–99)
POTASSIUM: 4.9 mmol/L (ref 3.5–5.1)
SODIUM: 128 mmol/L — AB (ref 135–145)

## 2018-05-29 LAB — MRSA PCR SCREENING: MRSA by PCR: NEGATIVE

## 2018-05-29 MED ORDER — SODIUM CHLORIDE 1 G PO TABS: 1.0000 g | ORAL_TABLET | Freq: Three times a day (TID) | ORAL | 0 refills | Status: DC

## 2018-05-29 MED ORDER — SODIUM CHLORIDE 1 G PO TABS
1.0000 g | ORAL_TABLET | Freq: Three times a day (TID) | ORAL | Status: DC
  Administered 2018-05-29 – 2018-06-01 (×10): 1 g via ORAL
  Filled 2018-05-29 (×11): qty 1

## 2018-05-29 MED ORDER — METHOCARBAMOL 500 MG PO TABS: 500.0000 mg | ORAL_TABLET | Freq: Four times a day (QID) | ORAL | 0 refills | Status: AC | PRN

## 2018-05-29 MED ORDER — ASPIRIN 81 MG PO TBEC: 81.0000 mg | DELAYED_RELEASE_TABLET | Freq: Two times a day (BID) | ORAL | 0 refills | Status: DC

## 2018-05-29 MED ORDER — HYDROCODONE-ACETAMINOPHEN 5-325 MG PO TABS: 1.0000 | ORAL_TABLET | Freq: Four times a day (QID) | ORAL | 0 refills | Status: DC | PRN

## 2018-05-29 NOTE — Clinical Social Work Note (Signed)
Clinical Social Work Assessment  Patient Details  Name: Lisa James MRN: [id number] Date of Birth: 11-28-1927  Date of referral:  05/29/18               Reason for consult:  Discharge Planning                Permission sought to share information with:  Case Manager, Facility Sport and exercise psychologist, Family Supports Permission granted to share information::  Yes, Verbal Permission Granted  Name::     Chief Operating Officer::  SNFs  Relationship::  niece  Contact Information:  [phone]  Housing/Transportation Living arrangements for the past 2 months:  [place] of Information:  Patient Patient Interpreter Needed:  None Criminal Activity/Legal Involvement Pertinent to Current Situation/Hospitalization:  No - Comment as needed Significant Relationships:  Adult Children, Other Family Members Lives with:  Facility Resident Do you feel safe going back to the place where you live?  Yes Need for family participation in patient care:  Yes (Comment)  Care giving concerns:  CSW received referral for possible SNF placement at time of discharge. Spoke with patient regarding possibility of SNF placement . Patient's  family  is currently unable to care for her at their home given patient's current needs and fall risk.  Patient has been living at [place] [facility] for past few months and really enjoys it. Patient's niece and patient   expressed understanding of PT recommendation and are agreeable to SNF placement at time of discharge. CSW to continue to follow and assist with discharge planning needs.     Social Worker assessment / plan:  Spoke with patient and   niece concerning possibility of rehab at [facility] before returning home.    Employment status:  Retired Nurse, adult PT Recommendations:  [place] / Referral to community resources:  [place]  Patient/Family's Response to care:  Patient and  niece    recognize need for rehab before returning home and are agreeable to a SNF in [place]. They would like to return to [facility] where patient is from.  CSW explained insurance authorization process and is following up with facility if we will have to re-run insurance auth.  Patient's family reported that they want patient to get stronger to be able to come back home.    Patient/Family's Understanding of and Emotional Response to Diagnosis, Current Treatment, and Prognosis:  Patient/family is realistic regarding therapy needs and expressed being hopeful for SNF placement and to return to [facility]. Patient expressed understanding of CSW role and discharge process as well as medical condition. No questions/concerns about plan or treatment.    Emotional Assessment Appearance:  Appears stated age Attitude/Demeanor/Rapport:  Self-Confident, Gracious Affect (typically observed):  Accepting Orientation:  Oriented to Self Alcohol / Substance use:  Not Applicable Psych involvement (Current and /or in the community):  No (Comment)  Discharge Needs  Concerns to be addressed:  Care Coordination Readmission within the last 30 days:  Yes Current discharge risk:  Dependent with Mobility Barriers to Discharge:  Continued Medical Work up   [employer], LCSW 05/29/2018, 10:04 AM

## 2018-05-29 NOTE — Plan of Care (Signed)

## 2018-05-29 NOTE — Op Note (Signed)
Lisa James, Lisa James MEDICAL RECORD OE:[id number] ACCOUNT [id number] DATE OF BIRTH:[birth year] FACILITY: [facility] LOCATION: [facility] PHYSICIAN:GREGORY Randel Pigg, MD  OPERATIVE REPORT  DATE OF PROCEDURE:  05/28/2018  PREOPERATIVE DIAGNOSIS:  Displaced right proximal humerus fracture.  POSTOPERATIVE DIAGNOSIS:  Displaced right proximal humerus fracture.  PROCEDURE:   1.  Open reduction internal fixation of proximal humerus fracture. 2.  Use of fluoroscopy to identify fracture reduction and hardware placement.  SURGEON:  Meredith Pel, MD  ASSISTANT:  April Green, RNFA.    INDICATIONS:  The patient is an 84 year old patient with proximal humerus fracture which became displaced.  She presents now for operative management after explanation of risks and benefits.  PROCEDURE IN DETAIL:  The patient was brought to the operating room where general endotracheal anesthesia was induced.  Preoperative antibiotics administered.  Timeout was called.  The patient was placed on a fluoroscopy bed.  She was elevated about 20  degrees.  Right arm, shoulder and hand prescrubbed with hydrogen peroxide, alcohol and Betadine, which was allowed to air dry, then prepped with ChloraPrep solution and draped in a sterile manner.  Charlie Pitter was used to cover the entire operative field.   Timeout was called.  Deltopectoral approach was made.  Skin and subcutaneous tissue were sharply divided.  Cephalic vein mobilized laterally.  Fracture was identified.  Part of the deltoid was elevated anteriorly for visualization and mobilization.  At  this time, the fracture was reduced.  Soft tissue attachments were maintained where possible.  There was significant comminution around the fracture site.  A Biomet proximal humerus plate was utilized.  Fixation initially was made on the humeral shaft  and then the head was reduced to the shaft and pins were placed.  Fluoroscopic guidance was used to confirm correct placement in the AP  and lateral planes of the multiple pegs and screws utilized.  All in all, a stable construct was achieved.  Thorough  irrigation was then performed and bone graft was then placed into a defect anteriorly.  At this time, the deltopectoral interval was closed using #1 Vicryl suture followed by interrupted inverted 0 Vicryl suture, 2-0 Vicryl suture and a 3-0 Monocryl.   Aquacel dressing placed.  The patient tolerated the procedure well without immediate complications.  He was transferred to the recovery room in stable condition.  Plan is for discharge back to skilled nursing tomorrow.  TN/NUANCE  D:05/28/2018 T:05/29/2018 JOB:[id number]

## 2018-05-29 NOTE — Progress Notes (Addendum)
Subjective: Patient stable.  Pain controlled because block is still in effect.   Objective: Vital signs in last 24 hours: Temp:  [97.7 F (36.5 C)-98.3 F (36.8 C)] 97.9 F (36.6 C) (11/08 0317) Pulse Rate:  [55-69] 62 (11/08 0317) Resp:  [7-19] 13 (11/08 0317) BP: (90-210)/(42-98) 90/54 (11/08 0317) SpO2:  [90 %-100 %] 90 % (11/08 0317) Weight:  [53.6 kg] 53.6 kg (11/07 1628)  Intake/Output from previous day: 11/07 0701 - 11/08 0700 In: 700 [I.V.:700] Out: 900 [Urine:750; Blood:150] Intake/Output this shift: No intake/output data recorded.  Exam:  No cellulitis present Compartment soft  Labs: No results for input(s): HGB in the last 72 hours. No results for input(s): WBC, RBC, HCT, PLT in the last 72 hours. Recent Labs    05/28/18 1725  NA 127*  K 4.5  CL 92*  CO2 27  BUN 19  CREATININE 0.98  GLUCOSE 95  CALCIUM 8.9   No results for input(s): LABPT, INR in the last 72 hours.  Assessment/Plan: Plan at this time is for transfer back to skilled nursing.  Sodium is slightly low.  We will have medical consultation prior to discharge. I did discuss this case with the medical consultant.  Recommendation at this time is for salt tablets to be given for the next 5 days and then have the primary care provider at the nursing home recheck the sodium at that time.  Patient's sodium is actually improved compared to admission sodium approximately 10 days ago.  G Scott Orvil Faraone 05/29/2018, 8:08 AM

## 2018-05-29 NOTE — NC FL2 (Signed)
[place] LEVEL OF CARE SCREENING TOOL     IDENTIFICATION  Patient Name: Lisa James Birthdate: 1928-05-18 Sex: female Admission Date (Current Location): 05/28/2018  [facility] and [place] Number:  Herbalist and Address:  [facility], [place] [address], [place], [place] [address]      Provider Number: [id number]  Attending Physician Name and Address:  Meredith Pel, MD  Relative Name and Phone Number:  Anne Ng (niece) [phone]    Current Level of Care: SNF Recommended Level of Care: [place] Prior Approval Number:    Date Approved/Denied:   PASRR Number: [id number]  Discharge Plan: SNF    Current Diagnoses: Patient Active Problem List   Diagnosis Date Noted  . Proximal humerus fracture 05/28/2018  . Pressure injury of skin 05/15/2018  . Fall 05/14/2018  . Closed comminuted right humeral fracture 05/14/2018  . UTI (urinary tract infection) 05/14/2018  . Hyponatremia 05/14/2018  . Leucocytosis 05/14/2018  . Altered mental status 05/13/2015  . Idiopathic thrombocytopenic purpura ([place]) 07/06/2013  . Aortic valve stenosis 06/19/2013  . Immune thrombocytopenic purpura ([place]) 01/21/2013  . GERD (gastroesophageal reflux disease) 12/17/2012  . Type II or unspecified type diabetes mellitus with renal manifestations, not stated as uncontrolled(250.40) 12/10/2012  . Chronic kidney disease (CKD), stage III (moderate) ([place]) 11/22/2012  . Diabetes mellitus ([place]) 07/03/2011  . Depression 07/03/2011  . Non-Hodgkin's lymphoma ([place]) 07/03/2011  . CAD (coronary artery disease) 07/03/2011  . PAD (peripheral artery disease) ([place]) 07/03/2011  . Vitamin D deficiency 07/03/2011  . Subdural hematoma ([place]) 07/03/2011  . Confusion 05/12/2011  . Subdural hematoma, acute ([place]) 05/12/2011  . HYPERLIPIDEMIA TYPE IIB / III 05/10/2009  . OLD MYOCARDIAL INFARCTION 05/10/2009  . Occlusion and stenosis of carotid artery  without mention of cerebral infarction 05/10/2009  . HYPERTENSION, BENIGN 11/03/2008  . MITRAL REGURGITATION 10/29/2008  . ISCHEMIC HEART DISEASE 10/29/2008  . SYSTOLIC HEART FAILURE, CHRONIC 10/29/2008    Orientation RESPIRATION BLADDER Height & Weight     Self  Normal Continent, External catheter Weight: 118 lb 2.7 oz (53.6 kg) Height:  5\' 1"  (154.9 cm)  BEHAVIORAL SYMPTOMS/MOOD NEUROLOGICAL BOWEL NUTRITION STATUS      Continent Diet(see discharge summary)  AMBULATORY STATUS COMMUNICATION OF NEEDS Skin   Limited Assist Verbally PU Stage and Appropriate Care, Surgical wounds, Skin abrasions(ecchymosis of right arm, chest, and hip, abrasion right chest, fissure mid sacrum, mosture associated skin damage on perineum, pressure injury sacrum, closed right arm surgical incision)   PU Stage 2 Dressing: (PRN)                   Personal Care Assistance Level of Assistance  Bathing, Feeding, Dressing, Total care Bathing Assistance: Limited assistance Feeding assistance: Independent Dressing Assistance: Limited assistance Total Care Assistance: Limited assistance   Functional Limitations Info  Sight, Hearing Sight Info: Adequate Hearing Info: Adequate Speech Info: Adequate    SPECIAL CARE FACTORS FREQUENCY  PT (By licensed PT), OT (By licensed OT)     PT Frequency: min 2x weekly OT Frequency: min 3x weekly            Contractures Contractures Info: Not present    Additional Factors Info  Code Status, Allergies Code Status Info: full Allergies Info: Allergies:  Ace Inhibitors           Current Medications (05/29/2018):  This is the current hospital active medication list Current Facility-Administered Medications  Medication Dose Route Frequency Provider Last  Rate Last Dose  . aspirin EC tablet 81 mg  81 mg Oral BID Meredith Pel, MD   81 mg at 05/28/18 2217  . atorvastatin (LIPITOR) tablet 20 mg  20 mg Oral QPM Meredith Pel, MD   20 mg at 05/28/18 2217   . calcium-vitamin D (OSCAL WITH D) 500-200 MG-UNIT per tablet 1 tablet  1 tablet Oral Daily Meredith Pel, MD      . carvedilol (COREG) tablet 6.25 mg  6.25 mg Oral BID WC Meredith Pel, MD      . cholecalciferol (VITAMIN D3) tablet 1,000 Units  1,000 Units Oral QHS Meredith Pel, MD   1,000 Units at 05/28/18 2217  . docusate sodium (COLACE) capsule 100 mg  100 mg Oral BID Meredith Pel, MD   100 mg at 05/28/18 2217  . eltrombopag (PROMACTA) tablet 50 mg  50 mg Oral Daily Meredith Pel, MD      . HYDROcodone-acetaminophen (NORCO/VICODIN) 5-325 MG per tablet 1 tablet  1 tablet Oral Q6H PRN Marlou Sa Tonna Corner, MD      . lactated ringers infusion   Intravenous Continuous Marlou Sa Tonna Corner, MD 75 mL/hr at 05/28/18 2154    . lactobacillus acidophilus (BACID) tablet 2 tablet  2 tablet Oral TID Meredith Pel, MD   2 tablet at 05/28/18 2213  . menthol-cetylpyridinium (CEPACOL) lozenge 3 mg  1 lozenge Oral PRN Meredith Pel, MD       Or  . phenol (CHLORASEPTIC) mouth spray 1 spray  1 spray Mouth/Throat PRN Meredith Pel, MD      . methocarbamol (ROBAXIN) tablet 500 mg  500 mg Oral Q6H PRN Meredith Pel, MD       Or  . methocarbamol (ROBAXIN) 500 mg in dextrose 5 % 50 mL IVPB  500 mg Intravenous Q6H PRN Meredith Pel, MD      . metoCLOPramide (REGLAN) tablet 5-10 mg  5-10 mg Oral Q8H PRN Meredith Pel, MD       Or  . metoCLOPramide (REGLAN) injection 5-10 mg  5-10 mg Intravenous Q8H PRN Meredith Pel, MD      . morphine 2 MG/ML injection 0.5-1 mg  0.5-1 mg Intravenous Q4H PRN Meredith Pel, MD      . ondansetron [facility]) tablet 4 mg  4 mg Oral Q6H PRN Meredith Pel, MD       Or  . ondansetron [facility]) injection 4 mg  4 mg Intravenous Q6H PRN Meredith Pel, MD      . sertraline (ZOLOFT) tablet 25 mg  25 mg Oral Daily Meredith Pel, MD      . sodium chloride tablet 1 g  1 g Oral TID WC Meredith Pel,  MD      . traMADol [facility]at[facility]) tablet 50 mg  50 mg Oral Q6H Meredith Pel, MD   50 mg at 05/29/18 0516  . traZODone (DESYREL) tablet 50-100 mg  50-100 mg Oral QHS PRN Marlou Sa Tonna Corner, MD         Discharge Medications: Please see discharge summary for a list of discharge medications.  Relevant Imaging Results:  Relevant Lab Results:   Additional Information SSN: [id number]  Alberteen Sam, LCSW

## 2018-05-29 NOTE — Discharge Summary (Addendum)
Physician Discharge Summary  Patient ID: Lisa James MRN: [id number] DOB/AGE: [id number] 82 y.o.  Admit date: 05/28/2018 Discharge date: 06/01/2018  Admission Diagnoses:  Active Problems:   Proximal humerus fracture   Discharge Diagnoses:  Same  Surgeries: Procedure(s): RIGHT OPEN REDUCTION INTERNAL FIXATION (ORIF) PROXIMAL HUMERUS FRACTURE on 05/28/2018   Consultants:   Discharged Condition: Stable  Hospital Course: Lisa James is an 84 y.o. female who was admitted 05/28/2018 with a chief complaint of right shoulder pain, and found to have a diagnosis of right proximal humerus fracture.  They were brought to the operating room on 05/28/2018 and underwent the above named procedures.  Patient did well with the procedure.  She had a block which gave her good pain relief.  Patient was medically stable at the time of discharge and is transferred back to skilled nursing.  She will follow-up with me in 10 days.  Okay to come out of the sling for showers but I want her to stay in the sling most of the time until her return office visit.  She is discharged on pain medicine did be taken every 8 hours along with muscle relaxer and aspirin for DVT prophylaxis.  Patient's sodium was 127 on discharge.  In consultation over the phone with the medical consultant we have decided to treat with sodium tablets 1 g 3 times a day for 5 days with recommendation for facility recheck on the sodium next week.  This is essentially the recommendation that the patient had upon discharge to the facility last week.  Antibiotics given:  Anti-infectives (From admission, onward)   Start     Dose/Rate Route Frequency Ordered Stop   05/29/18 0600  ceFAZolin (ANCEF) IVPB 2g/100 mL premix     2 g 200 mL/hr over 30 Minutes Intravenous On call to O.R. 05/28/18 1623 05/28/18 1741   05/28/18 2300  ceFAZolin (ANCEF) IVPB 1 g/50 mL premix     1 g 100 mL/hr over 30 Minutes Intravenous Every 6 hours 05/28/18 2149  05/29/18 0451   05/28/18 1627  ceFAZolin (ANCEF) 2-4 GM/100ML-% IVPB    Note to Pharmacy:  Block, Sarah   : cabinet override      05/28/18 1627 05/28/18 1741    .  Recent vital signs:  Vitals:   05/29/18 0059 05/29/18 0317  BP: (!) 112/53 (!) 90/54  Pulse: (!) 55 62  Resp: 16 13  Temp:  97.9 F (36.6 C)  SpO2: 95% 90%    Recent laboratory studies:  Results for orders placed or performed during the hospital encounter of 84/53/64  Basic metabolic panel  Result Value Ref Range   Sodium 127 (L) 135 - 145 mmol/L   Potassium 4.5 3.5 - 5.1 mmol/L   Chloride 92 (L) 98 - 111 mmol/L   CO2 27 22 - 32 mmol/L   Glucose, Bld 95 70 - 99 mg/dL   BUN 19 8 - 23 mg/dL   Creatinine, Ser 0.98 0.44 - 1.00 mg/dL   Calcium 8.9 8.9 - 10.3 mg/dL   GFR calc non Af Amer 50 (L) >60 mL/min   GFR calc Af Amer 58 (L) >60 mL/min   Anion gap 8 5 - 15  Glucose, capillary  Result Value Ref Range   Glucose-Capillary 96 70 - 99 mg/dL  Glucose, capillary  Result Value Ref Range   Glucose-Capillary 102 (H) 70 - 99 mg/dL    Discharge Medications:   Allergies as of 05/29/2018      Reactions  Ace Inhibitors Cough      Medication List    TAKE these medications   aspirin 81 MG EC tablet Take 1 tablet (81 mg total) by mouth 2 (two) times daily.   atorvastatin 20 MG tablet Commonly known as:  LIPITOR TAKE 1 TABLET (20 MG TOTAL) BY MOUTH DAILY. What changed:  See the new instructions.   calcium-vitamin D 500-200 MG-UNIT tablet Take 1 tablet by mouth daily.   carvedilol 6.25 MG tablet Commonly known as:  COREG Take 1 tablet (6.25 mg total) by mouth 2 (two) times daily with a meal.   HYDROcodone-acetaminophen 5-325 MG tablet Commonly known as:  NORCO/VICODIN Take 1 tablet by mouth every 6 (six) hours as needed for moderate pain (pain score 4-6).   lactobacillus acidophilus Tabs tablet Take 2 tablets by mouth 3 (three) times daily.   methocarbamol 500 MG tablet Commonly known as:  ROBAXIN Take  1 tablet (500 mg total) by mouth every 6 (six) hours as needed for muscle spasms.   PRESERVISION AREDS PO Take 1 tablet by mouth daily.   PROMACTA 50 MG tablet Generic drug:  eltrombopag TAKE 1 TABLET (50 MG TOTAL) BY MOUTH DAILY AT 6 AM. TAKE ON AN EMPTY STOMACH 1 HR BEFORE OR 2 HRS AFTER FOOD, SEPARATE FROM VITAMINS What changed:  See the new instructions.   sertraline 25 MG tablet Commonly known as:  ZOLOFT Take 25 mg by mouth daily.   traZODone 50 MG tablet Commonly known as:  DESYREL Take 50-100 mg by mouth at bedtime as needed for sleep.   Vitamin D 1000 units capsule Take 1,000 Units by mouth at bedtime.       Diagnostic Studies: Dg Chest 2 View  Result Date: 05/14/2018 CLINICAL DATA:  Fall, shoulder pain EXAM: CHEST - 2 VIEW COMPARISON:  05/12/2015 FINDINGS: Prior CABG. Mild cardiomegaly. Lungs clear. No effusions or pneumothorax. Severely displaced right humeral neck fracture noted as seen on today's right shoulder series. IMPRESSION: No acute cardiopulmonary disease. Displaced right humeral neck fracture. Electronically Signed   By: Rolm Baptise M.D.   On: 05/14/2018 20:22   Dg Shoulder Right  Result Date: 05/28/2018 CLINICAL DATA:  Repair of right shoulder fracture FLUOROSCOPY TIME:  13 seconds. Images: 2 EXAM: RIGHT SHOULDER - 2+ VIEW; DG C-ARM 61-120 MIN COMPARISON:  None. FINDINGS: A plate has been affixed to the proximal humerus, crossing the fracture site. There are multiple affixing screws. IMPRESSION: Fracture repair as above. Electronically Signed   By: Dorise Bullion III M.D   On: 05/28/2018 20:22   Dg Shoulder Right  Result Date: 05/14/2018 CLINICAL DATA:  Fall, shoulder pain EXAM: RIGHT SHOULDER - 2+ VIEW COMPARISON:  None. FINDINGS: There Is a right humeral neck fracture. The humeral shaft is displaced medially relative to the humeral head which appears located in the glenohumeral fossa. Degenerative changes in the right AC and glenohumeral joints.  IMPRESSION: Severely displaced right humeral neck fracture. Electronically Signed   By: Rolm Baptise M.D.   On: 05/14/2018 20:21   Ct Head Wo Contrast  Result Date: 05/14/2018 CLINICAL DATA:  84 year old female with fall, head and neck injury. EXAM: CT HEAD WITHOUT CONTRAST CT CERVICAL SPINE WITHOUT CONTRAST TECHNIQUE: Multidetector CT imaging of the head and cervical spine was performed following the standard protocol without intravenous contrast. Multiplanar CT image reconstructions of the cervical spine were also generated. COMPARISON:  08/22/2015 CT and prior studies FINDINGS: CT HEAD FINDINGS Brain: No acute infarction, hemorrhage, hydrocephalus or significant change noted  from the prior study. A large chronic calcified LEFT frontoparietal subdural collection is again identified measuring up to 2.6 cm in maximal diameter causing 6 mm LEFT to RIGHT midline shift-unchanged. Atrophy and chronic small-vessel white matter ischemic changes are again noted. Vascular: Atherosclerotic calcifications again noted. Skull: Normal. Negative for fracture or focal lesion. Sinuses/Orbits: No acute finding. Other: None. CT CERVICAL SPINE FINDINGS Alignment: Normal. Skull base and vertebrae: No acute fracture. No primary bone lesion or focal pathologic process. Soft tissues and spinal canal: No prevertebral fluid or swelling. No visible canal hematoma. Disc levels: Moderate multilevel degenerative disc disease and spondylosis again noted. Mild multilevel facet arthropathy again identified. Upper chest: No acute abnormality Other: None IMPRESSION: 1. No evidence of acute intracranial abnormality. 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 2. No static evidence of acute injury to the cervical spine. Moderate multilevel degenerative changes 3. Cerebral atrophy and chronic small-vessel white matter ischemic changes. Electronically Signed   By:  Margarette Canada M.D.   On: 05/14/2018 20:03   Ct Cervical Spine Wo Contrast  Result Date: 05/14/2018 CLINICAL DATA:  84 year old female with fall, head and neck injury. EXAM: CT HEAD WITHOUT CONTRAST CT CERVICAL SPINE WITHOUT CONTRAST TECHNIQUE: Multidetector CT imaging of the head and cervical spine was performed following the standard protocol without intravenous contrast. Multiplanar CT image reconstructions of the cervical spine were also generated. COMPARISON:  08/22/2015 CT and prior studies FINDINGS: CT HEAD FINDINGS Brain: No acute infarction, hemorrhage, hydrocephalus or significant change noted from the prior study. A large chronic calcified LEFT frontoparietal subdural collection is again identified measuring up to 2.6 cm in maximal diameter causing 6 mm LEFT to RIGHT midline shift-unchanged. Atrophy and chronic small-vessel white matter ischemic changes are again noted. Vascular: Atherosclerotic calcifications again noted. Skull: Normal. Negative for fracture or focal lesion. Sinuses/Orbits: No acute finding. Other: None. CT CERVICAL SPINE FINDINGS Alignment: Normal. Skull base and vertebrae: No acute fracture. No primary bone lesion or focal pathologic process. Soft tissues and spinal canal: No prevertebral fluid or swelling. No visible canal hematoma. Disc levels: Moderate multilevel degenerative disc disease and spondylosis again noted. Mild multilevel facet arthropathy again identified. Upper chest: No acute abnormality Other: None IMPRESSION: 1. No evidence of acute intracranial abnormality. 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 2. No static evidence of acute injury to the cervical spine. Moderate multilevel degenerative changes 3. Cerebral atrophy and chronic small-vessel white matter ischemic changes. Electronically Signed   By: Margarette Canada M.D.   On: 05/14/2018 20:03   Ct Shoulder Right Wo  Contrast  Result Date: 05/14/2018 CLINICAL DATA:  [place].  Right shoulder fracture. EXAM: CT OF THE UPPER RIGHT EXTREMITY WITHOUT CONTRAST TECHNIQUE: Multidetector CT imaging of the upper right extremity was performed according to the standard protocol. COMPARISON:  Radiographs 05/14/2018 FINDINGS: There is a transverse fracture through the humeral neck with medial displacement of the humeral shaft. The humeral shaft is displaced approximately 3 cm and is adjacent to the humeral head and lower aspect of the glenoid. The humeral head is articulating with the glenoid. No vertical fracture through the greater tuberosity. No glenoid fracture. The [facility] joint is intact. The visualized right ribs are intact and the visualized right lung is clear. Atherosclerotic calcifications involving the ascending aorta and surgical changes from bypass surgery. IMPRESSION: 1. Displaced transverse fracture through the humeral neck as detailed  above. Please see 3D images. 2. The other bony structures are intact. Electronically Signed   By: Marijo Sanes M.D.   On: 05/14/2018 21:57   Dg Shoulder Right Port  Result Date: 05/28/2018 CLINICAL DATA:  Postop films EXAM: PORTABLE RIGHT SHOULDER COMPARISON:  None. FINDINGS: Interval repair of right shoulder fracture. Bony fragments are seen medial to the proximal humerus. No other acute abnormalities. IMPRESSION: Fracture repair with remaining bony fragments as above. Electronically Signed   By: Dorise Bullion III M.D   On: 05/28/2018 21:23   Dg Humerus Right  Result Date: 05/15/2018 CLINICAL DATA:  Postreduction EXAM: RIGHT HUMERUS - 2+ VIEW COMPARISON:  05/14/2018 FINDINGS: Interval reduction of the displaced humeral neck fracture with improved alignment on this single AP view. No subluxation or dislocation. IMPRESSION: Improved alignment postreduction on this single view. Electronically Signed   By: Rolm Baptise M.D.   On: 05/15/2018 00:58   Dg C-arm 1-60 Min  Result Date:  05/28/2018 CLINICAL DATA:  Repair of right shoulder fracture FLUOROSCOPY TIME:  13 seconds. Images: 2 EXAM: RIGHT SHOULDER - 2+ VIEW; DG C-ARM 61-120 MIN COMPARISON:  None. FINDINGS: A plate has been affixed to the proximal humerus, crossing the fracture site. There are multiple affixing screws. IMPRESSION: Fracture repair as above. Electronically Signed   By: Dorise Bullion III M.D   On: 05/28/2018 20:22   Dg C-arm 1-60 Min  Result Date: 05/28/2018 CLINICAL DATA:  Repair of right shoulder fracture FLUOROSCOPY TIME:  13 seconds. Images: 2 EXAM: RIGHT SHOULDER - 2+ VIEW; DG C-ARM 61-120 MIN COMPARISON:  None. FINDINGS: A plate has been affixed to the proximal humerus, crossing the fracture site. There are multiple affixing screws. IMPRESSION: Fracture repair as above. Electronically Signed   By: Dorise Bullion III M.D   On: 05/28/2018 20:22   Xr Shoulder Right  Result Date: 05/27/2018 2 views right proximal humerus reviewed.  Significantly more displacement is present in the 2 part proximal humerus fracture.  Shaft is displaced medially.  The humeral head remains located in the socket   Disposition: Discharge disposition: 01-Home or Self Care       Discharge Instructions    Call MD / Call 911   Complete by:  As directed    If you experience chest pain or shortness of breath, CALL 911 and be transported to the hospital emergency room.  If you develope a fever above 101 F, pus (white drainage) or increased drainage or redness at the wound, or calf pain, call your surgeon's office.   Constipation Prevention   Complete by:  As directed    Drink plenty of fluids.  Prune juice may be helpful.  You may use a stool softener, such as Colace (over the counter) 100 mg twice a day.  Use MiraLax (over the counter) for constipation as needed.   Diet - low sodium heart healthy   Complete by:  As directed    Discharge instructions   Complete by:  As directed    Okay to come out of the sling once a day  for shower. Okay for shower.  Dressing waterproof Stay in sling most of the time until return office visit   Increase activity slowly as tolerated   Complete by:  As directed          Signed: Anderson Malta 05/29/2018, 8:14 AM  Physician Discharge Summary  Patient ID: EZELLA KELL MRN: [id number] DOB/AGE: 1928/07/09 84 y.o.  Admit date: 05/28/2018 Discharge date: 05/29/2018  Admission Diagnoses:  Active Problems:   Proximal humerus fracture   Discharge Diagnoses:  Same  Surgeries: Procedure(s): RIGHT OPEN REDUCTION INTERNAL FIXATION (ORIF) PROXIMAL HUMERUS FRACTURE on 05/28/2018   Consultants:   Discharged Condition: Stable  Hospital Course: Lisa James is an 84 y.o. female who was admitted 05/28/2018 with a chief complaint of No chief complaint on file. , and found to have a diagnosis of <principal problem not specified>.  They were brought to the operating room on 05/28/2018 and underwent the above named procedures.    Antibiotics given:  Anti-infectives (From admission, onward)   Start     Dose/Rate Route Frequency Ordered Stop   05/29/18 0600  ceFAZolin (ANCEF) IVPB 2g/100 mL premix     2 g 200 mL/hr over 30 Minutes Intravenous On call to O.R. 05/28/18 1623 05/28/18 1741   05/28/18 2300  ceFAZolin (ANCEF) IVPB 1 g/50 mL premix     1 g 100 mL/hr over 30 Minutes Intravenous Every 6 hours 05/28/18 2149 05/29/18 0451   05/28/18 1627  ceFAZolin (ANCEF) 2-4 GM/100ML-% IVPB    Note to Pharmacy:  Block, Sarah   : cabinet override      05/28/18 1627 05/28/18 1741    .  Recent vital signs:  Vitals:   05/29/18 0059 05/29/18 0317  BP: (!) 112/53 (!) 90/54  Pulse: (!) 55 62  Resp: 16 13  Temp:  97.9 F (36.6 C)  SpO2: 95% 90%    Recent laboratory studies:  Results for orders placed or performed during the hospital encounter of 94/76/54  Basic metabolic panel  Result Value Ref Range   Sodium 127 (L) 135 - 145 mmol/L   Potassium 4.5 3.5 - 5.1 mmol/L    Chloride 92 (L) 98 - 111 mmol/L   CO2 27 22 - 32 mmol/L   Glucose, Bld 95 70 - 99 mg/dL   BUN 19 8 - 23 mg/dL   Creatinine, Ser 0.98 0.44 - 1.00 mg/dL   Calcium 8.9 8.9 - 10.3 mg/dL   GFR calc non Af Amer 50 (L) >60 mL/min   GFR calc Af Amer 58 (L) >60 mL/min   Anion gap 8 5 - 15  Glucose, capillary  Result Value Ref Range   Glucose-Capillary 96 70 - 99 mg/dL  Glucose, capillary  Result Value Ref Range   Glucose-Capillary 102 (H) 70 - 99 mg/dL    Discharge Medications:   Allergies as of 05/29/2018      Reactions   Ace Inhibitors Cough      Medication List    TAKE these medications   aspirin 81 MG EC tablet Take 1 tablet (81 mg total) by mouth 2 (two) times daily.   atorvastatin 20 MG tablet Commonly known as:  LIPITOR TAKE 1 TABLET (20 MG TOTAL) BY MOUTH DAILY. What changed:  See the new instructions.   calcium-vitamin D 500-200 MG-UNIT tablet Take 1 tablet by mouth daily.   carvedilol 6.25 MG tablet Commonly known as:  COREG Take 1 tablet (6.25 mg total) by mouth 2 (two) times daily with a meal.   HYDROcodone-acetaminophen 5-325 MG tablet Commonly known as:  NORCO/VICODIN Take 1 tablet by mouth every 6 (six) hours as needed for moderate pain (pain score 4-6).   lactobacillus acidophilus Tabs tablet Take 2 tablets by mouth 3 (three) times daily.   methocarbamol 500 MG tablet Commonly known as:  ROBAXIN Take 1 tablet (500 mg total) by mouth every 6 (six) hours as needed for muscle spasms.  PRESERVISION AREDS PO Take 1 tablet by mouth daily.   PROMACTA 50 MG tablet Generic drug:  eltrombopag TAKE 1 TABLET (50 MG TOTAL) BY MOUTH DAILY AT 6 AM. TAKE ON AN EMPTY STOMACH 1 HR BEFORE OR 2 HRS AFTER FOOD, SEPARATE FROM VITAMINS What changed:  See the new instructions.   sertraline 25 MG tablet Commonly known as:  ZOLOFT Take 25 mg by mouth daily.   traZODone 50 MG tablet Commonly known as:  DESYREL Take 50-100 mg by mouth at bedtime as needed for sleep.    Vitamin D 1000 units capsule Take 1,000 Units by mouth at bedtime.       Diagnostic Studies: Dg Chest 2 View  Result Date: 05/14/2018 CLINICAL DATA:  Fall, shoulder pain EXAM: CHEST - 2 VIEW COMPARISON:  05/12/2015 FINDINGS: Prior CABG. Mild cardiomegaly. Lungs clear. No effusions or pneumothorax. Severely displaced right humeral neck fracture noted as seen on today's right shoulder series. IMPRESSION: No acute cardiopulmonary disease. Displaced right humeral neck fracture. Electronically Signed   By: Rolm Baptise M.D.   On: 05/14/2018 20:22   Dg Shoulder Right  Result Date: 05/28/2018 CLINICAL DATA:  Repair of right shoulder fracture FLUOROSCOPY TIME:  13 seconds. Images: 2 EXAM: RIGHT SHOULDER - 2+ VIEW; DG C-ARM 61-120 MIN COMPARISON:  None. FINDINGS: A plate has been affixed to the proximal humerus, crossing the fracture site. There are multiple affixing screws. IMPRESSION: Fracture repair as above. Electronically Signed   By: Dorise Bullion III M.D   On: 05/28/2018 20:22   Dg Shoulder Right  Result Date: 05/14/2018 CLINICAL DATA:  Fall, shoulder pain EXAM: RIGHT SHOULDER - 2+ VIEW COMPARISON:  None. FINDINGS: There Is a right humeral neck fracture. The humeral shaft is displaced medially relative to the humeral head which appears located in the glenohumeral fossa. Degenerative changes in the right AC and glenohumeral joints. IMPRESSION: Severely displaced right humeral neck fracture. Electronically Signed   By: Rolm Baptise M.D.   On: 05/14/2018 20:21   Ct Head Wo Contrast  Result Date: 05/14/2018 CLINICAL DATA:  84 year old female with fall, head and neck injury. EXAM: CT HEAD WITHOUT CONTRAST CT CERVICAL SPINE WITHOUT CONTRAST TECHNIQUE: Multidetector CT imaging of the head and cervical spine was performed following the standard protocol without intravenous contrast. Multiplanar CT image reconstructions of the cervical spine were also generated. COMPARISON:  08/22/2015 CT and prior  studies FINDINGS: CT HEAD FINDINGS Brain: No acute infarction, hemorrhage, hydrocephalus or significant change noted from the prior study. A large chronic calcified LEFT frontoparietal subdural collection is again identified measuring up to 2.6 cm in maximal diameter causing 6 mm LEFT to RIGHT midline shift-unchanged. Atrophy and chronic small-vessel white matter ischemic changes are again noted. Vascular: Atherosclerotic calcifications again noted. Skull: Normal. Negative for fracture or focal lesion. Sinuses/Orbits: No acute finding. Other: None. CT CERVICAL SPINE FINDINGS Alignment: Normal. Skull base and vertebrae: No acute fracture. No primary bone lesion or focal pathologic process. Soft tissues and spinal canal: No prevertebral fluid or swelling. No visible canal hematoma. Disc levels: Moderate multilevel degenerative disc disease and spondylosis again noted. Mild multilevel facet arthropathy again identified. Upper chest: No acute abnormality Other: None IMPRESSION: 1. No evidence of acute intracranial abnormality. 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 2. No static evidence of acute injury to the cervical spine. Moderate multilevel degenerative changes 3. Cerebral atrophy and chronic small-vessel white matter ischemic changes. Electronically Signed  By: Margarette Canada M.D.   On: 05/14/2018 20:03   Ct Cervical Spine Wo Contrast  Result Date: 05/14/2018 CLINICAL DATA:  84 year old female with fall, head and neck injury. EXAM: CT HEAD WITHOUT CONTRAST CT CERVICAL SPINE WITHOUT CONTRAST TECHNIQUE: Multidetector CT imaging of the head and cervical spine was performed following the standard protocol without intravenous contrast. Multiplanar CT image reconstructions of the cervical spine were also generated. COMPARISON:  08/22/2015 CT and prior studies FINDINGS: CT HEAD FINDINGS Brain: No acute infarction, hemorrhage,  hydrocephalus or significant change noted from the prior study. A large chronic calcified LEFT frontoparietal subdural collection is again identified measuring up to 2.6 cm in maximal diameter causing 6 mm LEFT to RIGHT midline shift-unchanged. Atrophy and chronic small-vessel white matter ischemic changes are again noted. Vascular: Atherosclerotic calcifications again noted. Skull: Normal. Negative for fracture or focal lesion. Sinuses/Orbits: No acute finding. Other: None. CT CERVICAL SPINE FINDINGS Alignment: Normal. Skull base and vertebrae: No acute fracture. No primary bone lesion or focal pathologic process. Soft tissues and spinal canal: No prevertebral fluid or swelling. No visible canal hematoma. Disc levels: Moderate multilevel degenerative disc disease and spondylosis again noted. Mild multilevel facet arthropathy again identified. Upper chest: No acute abnormality Other: None IMPRESSION: 1. No evidence of acute intracranial abnormality. 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 2. No static evidence of acute injury to the cervical spine. Moderate multilevel degenerative changes 3. Cerebral atrophy and chronic small-vessel white matter ischemic changes. Electronically Signed   By: Margarette Canada M.D.   On: 05/14/2018 20:03   Ct Shoulder Right Wo Contrast  Result Date: 05/14/2018 CLINICAL DATA:  [place].  Right shoulder fracture. EXAM: CT OF THE UPPER RIGHT EXTREMITY WITHOUT CONTRAST TECHNIQUE: Multidetector CT imaging of the upper right extremity was performed according to the standard protocol. COMPARISON:  Radiographs 05/14/2018 FINDINGS: There is a transverse fracture through the humeral neck with medial displacement of the humeral shaft. The humeral shaft is displaced approximately 3 cm and is adjacent to the humeral head and lower aspect of the glenoid. The humeral head is articulating with the glenoid. No vertical  fracture through the greater tuberosity. No glenoid fracture. The [facility] joint is intact. The visualized right ribs are intact and the visualized right lung is clear. Atherosclerotic calcifications involving the ascending aorta and surgical changes from bypass surgery. IMPRESSION: 1. Displaced transverse fracture through the humeral neck as detailed above. Please see 3D images. 2. The other bony structures are intact. Electronically Signed   By: Marijo Sanes M.D.   On: 05/14/2018 21:57   Dg Shoulder Right Port  Result Date: 05/28/2018 CLINICAL DATA:  Postop films EXAM: PORTABLE RIGHT SHOULDER COMPARISON:  None. FINDINGS: Interval repair of right shoulder fracture. Bony fragments are seen medial to the proximal humerus. No other acute abnormalities. IMPRESSION: Fracture repair with remaining bony fragments as above. Electronically Signed   By: Dorise Bullion III M.D   On: 05/28/2018 21:23   Dg Humerus Right  Result Date: 05/15/2018 CLINICAL DATA:  Postreduction EXAM: RIGHT HUMERUS - 2+ VIEW COMPARISON:  05/14/2018 FINDINGS: Interval reduction of the displaced humeral neck fracture with improved alignment on this single AP view. No subluxation or dislocation. IMPRESSION: Improved alignment postreduction on this single view. Electronically Signed   By: Rolm Baptise M.D.   On: 05/15/2018 00:58   Dg C-arm 1-60 Min  Result Date: 05/28/2018 CLINICAL DATA:  Repair of right shoulder fracture  FLUOROSCOPY TIME:  13 seconds. Images: 2 EXAM: RIGHT SHOULDER - 2+ VIEW; DG C-ARM 61-120 MIN COMPARISON:  None. FINDINGS: A plate has been affixed to the proximal humerus, crossing the fracture site. There are multiple affixing screws. IMPRESSION: Fracture repair as above. Electronically Signed   By: Dorise Bullion III M.D   On: 05/28/2018 20:22   Dg C-arm 1-60 Min  Result Date: 05/28/2018 CLINICAL DATA:  Repair of right shoulder fracture FLUOROSCOPY TIME:  13 seconds. Images: 2 EXAM: RIGHT SHOULDER - 2+ VIEW; DG C-ARM  61-120 MIN COMPARISON:  None. FINDINGS: A plate has been affixed to the proximal humerus, crossing the fracture site. There are multiple affixing screws. IMPRESSION: Fracture repair as above. Electronically Signed   By: Dorise Bullion III M.D   On: 05/28/2018 20:22   Xr Shoulder Right  Result Date: 05/27/2018 2 views right proximal humerus reviewed.  Significantly more displacement is present in the 2 part proximal humerus fracture.  Shaft is displaced medially.  The humeral head remains located in the socket   Disposition: Discharge disposition: 01-Home or Self Care       Discharge Instructions    Call MD / Call 911   Complete by:  As directed    If you experience chest pain or shortness of breath, CALL 911 and be transported to the hospital emergency room.  If you develope a fever above 101 F, pus (white drainage) or increased drainage or redness at the wound, or calf pain, call your surgeon's office.   Constipation Prevention   Complete by:  As directed    Drink plenty of fluids.  Prune juice may be helpful.  You may use a stool softener, such as Colace (over the counter) 100 mg twice a day.  Use MiraLax (over the counter) for constipation as needed.   Diet - low sodium heart healthy   Complete by:  As directed    Discharge instructions   Complete by:  As directed    Okay to come out of the sling once a day for shower. Okay for shower.  Dressing waterproof Stay in sling most of the time until return office visit   Increase activity slowly as tolerated   Complete by:  As directed          Signed: Anderson Malta 05/29/2018, 8:14 AM

## 2018-05-29 NOTE — Evaluation (Signed)
Occupational Therapy Evaluation and Defer to next venue of care Patient Details Name: Lisa James MRN: [id number] DOB: January 14, 1928 Today's Date: 05/29/2018    History of Present Illness Pt is an 84 y/o female s/p RIGHT OPEN REDUCTION INTERNAL FIXATION (ORIF) PROXIMAL HUMERUS FRACTURE - after a fall at ALF 10 days ago. PMH includes MI, dementia, CKD, CAD s/p CABG, CHF, DM, HTN, and subdural hematoma.   Clinical Impression   Pt with decreased recall of shoulder education. Nieces present throughout session and education/handout provided to them as well. Mod A for stand pivot to recliner, re-adjusted sling and pt able to perform hand, wrist, forearm exercises. Pt will benefit from skilled OT post-acute at the SNF level to address OT deficits/problem list (see below).     Follow Up Recommendations  SNF;Supervision/Assistance - 24 hour    Equipment Recommendations  Other (comment)(defer to next venue)    Recommendations for Other Services       Precautions / Restrictions Precautions Precautions: Fall;Shoulder Type of Shoulder Precautions: conservative Shoulder Interventions: Shoulder sling/immobilizer;At all times;Off for dressing/bathing/exercises Precaution Booklet Issued: Yes (comment) Precaution Comments: reviewed handout with 2 nieces Required Braces or Orthoses: Sling Restrictions Weight Bearing Restrictions: Yes RUE Weight Bearing: Non weight bearing      Mobility Bed Mobility Overal bed mobility: Needs Assistance Bed Mobility: Supine to Sit     Supine to sit: Min assist     General bed mobility comments: min A for elevation of trunk and assist with bed pad to bring hips EOB  Transfers Overall transfer level: Needs assistance Equipment used: 1 person hand held assist Transfers: Sit to/from [facility] Sit to Stand: Mod assist Stand pivot transfers: Mod assist       General transfer comment: Heavy mod assist required for power-up to full  stand. Increased support required for pt to improve posture and stand up straight.     Balance Overall balance assessment: Needs assistance Sitting-balance support: No upper extremity supported;Feet supported Sitting balance-Leahy Scale: Fair   Postural control: Posterior lean Standing balance support: Single extremity supported;During functional activity Standing balance-Leahy Scale: Poor Standing balance comment: Reliant on LUE and external support. Heavy posterior lean                           ADL either performed or assessed with clinical judgement   ADL Overall ADL's : Needs assistance/impaired Eating/Feeding: Moderate assistance   Grooming: Moderate assistance;Sitting   Upper Body Bathing: Sitting;Maximal assistance   Lower Body Bathing: Maximal assistance   Upper Body Dressing : Maximal assistance;Sitting Upper Body Dressing Details (indicate cue type and reason): to don/doff sling Lower Body Dressing: Maximal assistance;+2 for safety/equipment   Toilet Transfer: Maximal assistance;Stand-pivot Toilet Transfer Details (indicate cue type and reason): simulated through recliner Toileting- Clothing Manipulation and Hygiene: Total assistance Toileting - Clothing Manipulation Details (indicate cue type and reason): Pt wears adult diapers at baseline - mesh underwear and pad used       General ADL Comments: no recall of precautions for ADL tasks     Vision         Perception     Praxis      Pertinent Vitals/Pain Pain Assessment: Faces Faces Pain Scale: Hurts a little bit Pain Location: R shoulder Pain Descriptors / Indicators: Guarding;Sore Pain Intervention(s): Monitored during session;Repositioned;Limited activity within patient's tolerance     Hand Dominance Right   Extremity/Trunk Assessment Upper Extremity Assessment Upper Extremity Assessment: RUE deficits/detail  RUE Deficits / Details: RUE in sling.  RUE: Unable to fully assess due to  immobilization RUE Coordination: decreased fine motor;decreased gross motor   Lower Extremity Assessment Lower Extremity Assessment: Generalized weakness   Cervical / Trunk Assessment Cervical / Trunk Assessment: Kyphotic   Communication Communication Communication: HOH   Cognition Arousal/Alertness: Awake/alert Behavior During Therapy: WFL for tasks assessed/performed Overall Cognitive Status: History of cognitive impairments - at baseline                                 General Comments: Dementia at baseline - very pleasant; Able to follow 1 step commands consistently   General Comments  niece present throghout session     Exercises Exercises: Shoulder Shoulder Exercises Elbow Flexion: AROM;Right;Seated Elbow Extension: AROM;Right;Seated Wrist Flexion: AROM;Right;Seated Wrist Extension: AROM;Right;Seated Digit Composite Flexion: AROM;Right;Seated Composite Extension: AROM;Right;Seated Neck Flexion: AROM Neck Extension: AROM Neck Lateral Flexion - Right: AROM Neck Lateral Flexion - Left: AROM   Shoulder Instructions Shoulder Instructions Donning/doffing shirt without moving shoulder: Maximal assistance Method for sponge bathing under operated UE: Maximal assistance Donning/doffing sling/immobilizer: Maximal assistance Correct positioning of sling/immobilizer: Maximal assistance ROM for elbow, wrist and digits of operated UE: Maximal assistance Sling wearing schedule (on at all times/off for ADL's): Maximal assistance Proper positioning of operated UE when showering: Maximal assistance Positioning of UE while sleeping: Maximal assistance    Home Living Family/patient expects to be discharged to:: Skilled nursing facility                                        Prior Functioning/Environment Level of Independence: Needs assistance  Gait / Transfers Assistance Needed: has not been walking for 3 weeks ADL's / Homemaking Assistance Needed:  requiring assist since recent hospitalization   Comments: neice reports PTA indepedent with ADLs, mobility; assist with IADLs and med mgmt; decline over 3 weeks since move to ALF        OT Problem List: Decreased strength;Decreased activity tolerance;Decreased range of motion;Impaired balance (sitting and/or standing);Decreased coordination;Decreased cognition;Decreased safety awareness;Decreased knowledge of precautions;Pain;Impaired UE functional use      OT Treatment/Interventions:      OT Goals(Current goals can be found in the care plan section) Acute Rehab OT Goals Patient Stated Goal: to get back to walking OT Goal Formulation: With family Time For Goal Achievement: 06/12/18 Potential to Achieve Goals: Fair  OT Frequency:     Barriers to D/C:            Co-evaluation              AM-PAC PT "6 Clicks" Daily Activity     Outcome Measure Help from another person eating meals?: A Little Help from another person taking care of personal grooming?: A Little Help from another person toileting, which includes using toliet, bedpan, or urinal?: Total Help from another person bathing (including washing, rinsing, drying)?: A Lot Help from another person to put on and taking off regular upper body clothing?: A Lot Help from another person to put on and taking off regular lower body clothing?: Total 6 Click Score: 12   End of Session Equipment Utilized During Treatment: Gait belt;Other (comment)(sling) Nurse Communication: Mobility status;Other (comment)(no pure wick)  Activity Tolerance: Patient tolerated treatment well Patient left: in chair;with call bell/phone within reach;with chair alarm set;with family/visitor present  OT  Visit Diagnosis: Unsteadiness on feet (R26.81);Other abnormalities of gait and mobility (R26.89);Muscle weakness (generalized) (M62.81);Pain Pain - Right/Left: Right Pain - part of body: Arm;Shoulder                Time: 1005-1043 OT Time  Calculation (min): 38 min Charges:  OT General Charges $OT Visit: 1 Visit OT Evaluation $OT Eval Moderate Complexity: 1 Mod OT Treatments $Self Care/Home Management : 8-22 mins  Hulda Humphrey OTR/L Acute Rehabilitation Services Pager: [phone] Office: [place] 05/29/2018, 2:15 PM

## 2018-05-30 DIAGNOSIS — W19XXXA Unspecified fall, initial encounter: Secondary | ICD-10-CM | POA: Diagnosis present

## 2018-05-30 DIAGNOSIS — I252 Old myocardial infarction: Secondary | ICD-10-CM | POA: Diagnosis not present

## 2018-05-30 DIAGNOSIS — E119 Type 2 diabetes mellitus without complications: Secondary | ICD-10-CM | POA: Diagnosis present

## 2018-05-30 DIAGNOSIS — S42201A Unspecified fracture of upper end of right humerus, initial encounter for closed fracture: Secondary | ICD-10-CM | POA: Diagnosis present

## 2018-05-30 DIAGNOSIS — Z79899 Other long term (current) drug therapy: Secondary | ICD-10-CM | POA: Diagnosis not present

## 2018-05-30 DIAGNOSIS — I2510 Atherosclerotic heart disease of native coronary artery without angina pectoris: Secondary | ICD-10-CM | POA: Diagnosis present

## 2018-05-30 DIAGNOSIS — Z888 Allergy status to other drugs, medicaments and biological substances status: Secondary | ICD-10-CM | POA: Diagnosis not present

## 2018-05-30 DIAGNOSIS — S42211A Unspecified displaced fracture of surgical neck of right humerus, initial encounter for closed fracture: Secondary | ICD-10-CM | POA: Diagnosis present

## 2018-05-30 DIAGNOSIS — Z8572 Personal history of non-Hodgkin lymphomas: Secondary | ICD-10-CM | POA: Diagnosis not present

## 2018-05-30 DIAGNOSIS — I5022 Chronic systolic (congestive) heart failure: Secondary | ICD-10-CM | POA: Diagnosis present

## 2018-05-30 DIAGNOSIS — I110 Hypertensive heart disease with heart failure: Secondary | ICD-10-CM | POA: Diagnosis present

## 2018-05-30 DIAGNOSIS — I080 Rheumatic disorders of both mitral and aortic valves: Secondary | ICD-10-CM | POA: Diagnosis present

## 2018-05-30 DIAGNOSIS — F0390 Unspecified dementia without behavioral disturbance: Secondary | ICD-10-CM | POA: Diagnosis present

## 2018-05-30 DIAGNOSIS — Z951 Presence of aortocoronary bypass graft: Secondary | ICD-10-CM | POA: Diagnosis not present

## 2018-05-30 LAB — GLUCOSE, CAPILLARY: GLUCOSE-CAPILLARY: 98 mg/dL (ref 70–99)

## 2018-05-30 NOTE — Evaluation (Signed)
Physical Therapy Evaluation Patient Details Name: Lisa James MRN: [id number] DOB: [birth year] Today's Date: 05/30/2018   History of Present Illness  Pt is an 84 y/o female s/p RIGHT OPEN REDUCTION INTERNAL FIXATION (ORIF) PROXIMAL HUMERUS FRACTURE - after a fall at ALF 10 days ago. PMH includes MI, dementia, CKD, CAD s/p CABG, CHF, DM, HTN, and subdural hematoma.    Clinical Impression  Pt admitted with above diagnosis. Pt currently with functional limitations due to the deficits listed below (see PT Problem List). On eval, pt required mod assist bed mobility and mod assist stand pivot transfer bed to recliner. Noted increased swelling R hand. RUE positioned on pillow in recliner with sling in place. Pt encouraged to keep R hand elevated. Pt will benefit from skilled PT to increase their independence and safety with mobility to allow discharge to the venue listed below.       Follow Up Recommendations SNF;Supervision/Assistance - 24 hour    Equipment Recommendations  None recommended by PT    Recommendations for Other Services       Precautions / Restrictions Precautions Precautions: Fall;Shoulder Type of Shoulder Precautions: conservative Shoulder Interventions: Shoulder sling/immobilizer;At all times;Off for dressing/bathing/exercises Restrictions RUE Weight Bearing: Non weight bearing      Mobility  Bed Mobility Overal bed mobility: Needs Assistance Bed Mobility: Supine to Sit     Supine to sit: HOB elevated;Mod assist     General bed mobility comments: assist to elevate trunk and scoot to EOB  Transfers Overall transfer level: Needs assistance   Transfers: Sit to/from Stand;Stand Pivot Transfers Sit to Stand: Mod assist Stand pivot transfers: Mod assist       General transfer comment: assist to power up. Increased time to stabilize initial standing balance, pt retropulsive. Pivot steps toward left bed to recliner.  Ambulation/Gait              General Gait Details: unable  Stairs            Wheelchair Mobility    Modified Rankin (Stroke Patients Only)       Balance Overall balance assessment: Needs assistance Sitting-balance support: No upper extremity supported;Feet supported Sitting balance-Leahy Scale: Fair   Postural control: Posterior lean Standing balance support: Single extremity supported;During functional activity Standing balance-Leahy Scale: Poor Standing balance comment: Reliant on LUE and external support. Heavy posterior lean                             Pertinent Vitals/Pain Pain Assessment: Faces Faces Pain Scale: Hurts a little bit Pain Location: R shoulder Pain Descriptors / Indicators: Guarding;Sore;Grimacing Pain Intervention(s): Monitored during session;Repositioned    Home Living Family/patient expects to be discharged to:: Skilled nursing facility                      Prior Function Level of Independence: Needs assistance   Gait / Transfers Assistance Needed: has not been walking for 3 weeks  ADL's / Homemaking Assistance Needed: requiring assist since recent hospitalization  Comments: neice reports PTA indepedent with ADLs, mobility; assist with IADLs and med mgmt; decline over 3 weeks since move to ALF     Hand Dominance   Dominant Hand: Right    Extremity/Trunk Assessment   Upper Extremity Assessment Upper Extremity Assessment: RUE deficits/detail RUE Deficits / Details: RUE in sling.     Lower Extremity Assessment Lower Extremity Assessment: Generalized weakness    Cervical / Trunk  Assessment Cervical / Trunk Assessment: Kyphotic  Communication   Communication: HOH  Cognition Arousal/Alertness: Awake/alert Behavior During Therapy: WFL for tasks assessed/performed Overall Cognitive Status: History of cognitive impairments - at baseline                                 General Comments: Dementia at baseline - very pleasant;  Able to follow 1 step commands consistently      General Comments      Exercises     Assessment/Plan    PT Assessment Patient needs continued PT services  PT Problem List Decreased cognition;Decreased knowledge of use of DME;Decreased safety awareness;Decreased knowledge of precautions;Decreased strength;Decreased balance;Decreased range of motion;Decreased mobility       PT Treatment Interventions DME instruction;Gait training;Functional mobility training;Therapeutic activities;Therapeutic exercise;Balance training;Patient/family education;Cognitive remediation    PT Goals (Current goals can be found in the Care Plan section)  Acute Rehab PT Goals Patient Stated Goal: to get back to walking PT Goal Formulation: With patient Time For Goal Achievement: 06/13/18 Potential to Achieve Goals: Fair    Frequency Min 2X/week   Barriers to discharge        Co-evaluation               AM-PAC PT "6 Clicks" Daily Activity  Outcome Measure Difficulty turning over in bed (including adjusting bedclothes, sheets and blankets)?: Unable Difficulty moving from lying on back to sitting on the side of the bed? : Unable Difficulty sitting down on and standing up from a chair with arms (e.g., wheelchair, bedside commode, etc,.)?: Unable Help needed moving to and from a bed to chair (including a wheelchair)?: A Lot Help needed walking in hospital room?: A Lot Help needed climbing 3-5 steps with a railing? : Total 6 Click Score: 8    End of Session Equipment Utilized During Treatment: Gait belt;Other (comment)(RUE sling) Activity Tolerance: Patient tolerated treatment well Patient left: in chair;with call bell/phone within reach;with family/visitor present Nurse Communication: Mobility status;Other (comment)(no chair alarm box in room. Pad in chair.) PT Visit Diagnosis: Unsteadiness on feet (R26.81);Muscle weakness (generalized) (M62.81);History of falling (Z91.81);Pain Pain -  Right/Left: Right Pain - part of body: Arm    Time: 0300-9233 PT Time Calculation (min) (ACUTE ONLY): 15 min   Charges:   PT Evaluation $PT Eval Moderate Complexity: 1 Mod          Lorrin Goodell, PT  Office # [phone] Pager [phone]   Lorriane Shire 05/30/2018, 9:39 AM

## 2018-05-30 NOTE — Plan of Care (Signed)

## 2018-05-30 NOTE — Progress Notes (Signed)
CSW contacted [facility] for update on insurance authorization. They report no insurance authorization for patient yet, and are doubtful of weekend auth. CSW will continue to follow up.   [place], [place]

## 2018-05-30 NOTE — Progress Notes (Signed)
     Subjective: 2 Days Post-Op Procedure(s) (LRB): RIGHT OPEN REDUCTION INTERNAL FIXATION (ORIF) PROXIMAL HUMERUS FRACTURE (Right) Awake, alert and oriented x 4. Patient is in recliner at bedside right shoulder immobilizer. In good spirits. Patient reports pain as moderate.    Objective:   VITALS:  Temp:  [98.6 F (37 C)-98.9 F (37.2 C)] 98.8 F (37.1 C) (11/09 0849) Pulse Rate:  [80-95] 83 (11/09 0849) Resp:  [15-19] 15 (11/09 0849) BP: (96-102)/(45-63) 96/53 (11/09 0849) SpO2:  [94 %-96 %] 94 % (11/09 0849)  Neurologically intact ABD soft Neurovascular intact Sensation intact distally Intact pulses distally Compartment soft   LABS No results for input(s): HGB, WBC, PLT in the last 72 hours. Recent Labs    05/28/18 1725 05/29/18 0829  NA 127* 128*  K 4.5 4.9  CL 92* 97*  CO2 27 23  BUN 19 18  CREATININE 0.98 1.12*  GLUCOSE 95 86   No results for input(s): LABPT, INR in the last 72 hours.   Assessment/Plan: 2 Days Post-Op Procedure(s) (LRB): RIGHT OPEN REDUCTION INTERNAL FIXATION (ORIF) PROXIMAL HUMERUS FRACTURE (Right)  Advance diet Up with therapy Discharge to SNF  She is stable orthopaedically but not able to walk or balance well and high risk of falling if she goes home. She is being placed in SNF for recovery and rehab. Insurance authorization is still being sought. She is ready of discharge when bed is available and insurance approval obtained.   Basil Dess 05/30/2018, 1:12 PMPatient ID: Lisa James, female   DOB: Jan 04, 1928, 84 y.o.   MRN: [id number]

## 2018-05-31 MED ORDER — ACETAMINOPHEN-CODEINE #3 300-30 MG PO TABS: 1.0000 | ORAL_TABLET | Freq: Four times a day (QID) | ORAL | Status: DC | PRN

## 2018-05-31 MED ORDER — DOCUSATE SODIUM 100 MG PO CAPS
100.0000 mg | ORAL_CAPSULE | Freq: Two times a day (BID) | ORAL | Status: DC
  Administered 2018-05-31 – 2018-06-01 (×2): 100 mg via ORAL
  Filled 2018-05-31 (×2): qty 1

## 2018-05-31 MED ORDER — POLYETHYLENE GLYCOL 3350 17 G PO PACK
17.0000 g | PACK | Freq: Every day | ORAL | Status: DC
  Administered 2018-05-31 – 2018-06-01 (×2): 17 g via ORAL
  Filled 2018-05-31: qty 1

## 2018-05-31 NOTE — Progress Notes (Signed)
Pt was not able to void last night. Pt tried to use the BSC this morning without no success. Bladder scan at 0235 am - 315ml. Intermittent cath output- 600 mL. Pt tolerated the procedure well. Nursing will continue to monitor.

## 2018-05-31 NOTE — Plan of Care (Signed)
  Problem: Education: Goal: Knowledge of General Education information will improve Description: Including pain rating scale, medication(s)/side effects and non-pharmacologic comfort measures Outcome: Progressing   Problem: Health Behavior/Discharge Planning: Goal: Ability to manage health-related needs will improve Outcome: Progressing   Problem: Clinical Measurements: Goal: Ability to maintain clinical measurements within normal limits will improve Outcome: Progressing Goal: Will remain free from infection Outcome: Progressing Goal: Diagnostic test results will improve Outcome: Progressing Goal: Respiratory complications will improve Outcome: Progressing Goal: Cardiovascular complication will be avoided Outcome: Progressing   Problem: Skin Integrity: Goal: Risk for impaired skin integrity will decrease Outcome: Progressing   Problem: Safety: Goal: Ability to remain free from injury will improve Outcome: Progressing   Problem: Pain Managment: Goal: General experience of comfort will improve Outcome: Progressing   Problem: Elimination: Goal: Will not experience complications related to bowel motility Outcome: Progressing Goal: Will not experience complications related to urinary retention Outcome: Progressing   

## 2018-05-31 NOTE — Progress Notes (Signed)
     Subjective: 3 Days Post-Op Procedure(s) (LRB): RIGHT OPEN REDUCTION INTERNAL FIXATION (ORIF) PROXIMAL HUMERUS FRACTURE (Right) AWAKE, alert and oriented x 2. Dressing right shoulder removed by patient yesterday, new aquacel dressing is intact.  Patient reports pain as mild.    Objective:   VITALS:  Temp:  [97.6 F (36.4 C)-97.7 F (36.5 C)] 97.7 F (36.5 C) (11/10 0531) Pulse Rate:  [78-95] 78 (11/10 0949) Resp:  [14-16] 16 (11/10 0949) BP: (107-128)/(46-80) 128/80 (11/10 0949) SpO2:  [97 %-100 %] 98 % (11/10 0949)  Neurologically intact ABD soft Neurovascular intact Sensation intact distally Intact pulses distally Dorsiflexion/Plantar flexion intact Incision: dressing C/D/I and no drainage No cellulitis present Compartment soft   LABS No results for input(s): HGB, WBC, PLT in the last 72 hours. Recent Labs    05/28/18 1725 05/29/18 0829  NA 127* 128*  K 4.5 4.9  CL 92* 97*  CO2 27 23  BUN 19 18  CREATININE 0.98 1.12*  GLUCOSE 95 86   No results for input(s): LABPT, INR in the last 72 hours.   Assessment/Plan: 3 Days Post-Op Procedure(s) (LRB): RIGHT OPEN REDUCTION INTERNAL FIXATION (ORIF) PROXIMAL HUMERUS FRACTURE (Right)  Advance diet Up with therapy Discharge to SNF  Indwelling foley in place due to 800 cc of retained usiy  Basil Dess 05/31/2018, 10:30 AMPatient ID: Lisa James Drafts, female   DOB: 03-02-1928, 84 y.o.   MRN: [id number]

## 2018-05-31 NOTE — Plan of Care (Signed)

## 2018-06-01 ENCOUNTER — Encounter (HOSPITAL_COMMUNITY): Payer: Self-pay | Admitting: Orthopedic Surgery

## 2018-06-01 MED ORDER — DOCUSATE SODIUM 100 MG PO CAPS: 100.0000 mg | ORAL_CAPSULE | Freq: Two times a day (BID) | ORAL | 0 refills | Status: DC

## 2018-06-01 NOTE — Progress Notes (Signed)
Patient was in chair after multiple attempts to get her back to bed by staff.  Patient later heard calling from room that she needed help because she was on the floor.  Patient found on floor mat unhurt.  Patient redirected into bed, placed in lowest position with alarm set.  Patient stated that she was fine.  It was found post fall that chair alarm pad was in seat, but there was no system to connect to.  Will continue to monitor.  Physician and family notified.

## 2018-06-01 NOTE — Clinical Social Work Placement (Signed)
   CLINICAL SOCIAL WORK PLACEMENT  NOTE  Date:  06/01/2018  Patient Details  Name: Lisa James MRN: [id number] Date of Birth: 1928-06-01  Clinical Social Work is seeking post-discharge placement for this patient at the [facility] level of care (*CSW will initial, date and re-position this form in  chart as items are completed):  Yes   Patient/family provided with [facility] Work Department's list of facilities offering this level of care within the geographic area requested by the patient (or if unable, by the patient's family).  Yes   Patient/family informed of their freedom to choose among providers that offer the needed level of care, that participate in Medicare, Medicaid or managed care program needed by the patient, have an available bed and are willing to accept the patient.  Yes   Patient/family informed of [facility]'s ownership interest in [facility] and [facility], as well as of the fact that they are under no obligation to receive care at these facilities.  PASRR submitted to EDS on       PASRR number received on 05/27/18     Existing PASRR number confirmed on       FL2 transmitted to all facilities in geographic area requested by pt/family on 05/27/18     FL2 transmitted to all facilities within larger geographic area on       Patient informed that his/her managed care company has contracts with or will negotiate with certain facilities, including the following:        Yes   Patient/family informed of bed offers received.  Patient chooses bed at [place], [place]     Physician recommends and patient chooses bed at      Patient to be transferred to [facility] on 06/01/18.  Patient to be transferred to facility by [facility]     Patient family notified on 06/01/18 of transfer.  Name of family member notified:  Annette     PHYSICIAN       Additional Comment:     _______________________________________________ Alberteen Sam, LCSW 06/01/2018, 1:44 PM

## 2018-06-01 NOTE — Progress Notes (Signed)
Patient discharging to [facility]. Report called in to [facility]. Patients niece will provide transportation. Took all personal belongings.

## 2018-06-01 NOTE — Plan of Care (Signed)
  Problem: Education: Goal: Knowledge of General Education information will improve Description: Including pain rating scale, medication(s)/side effects and non-pharmacologic comfort measures Outcome: Progressing   Problem: Activity: Goal: Risk for activity intolerance will decrease Outcome: Progressing   Problem: Elimination: Goal: Will not experience complications related to urinary retention Outcome: Progressing   Problem: Pain Managment: Goal: General experience of comfort will improve Outcome: Progressing   Problem: Safety: Goal: Ability to remain free from injury will improve Outcome: Progressing   Problem: Skin Integrity: Goal: Risk for impaired skin integrity will decrease Outcome: Progressing   

## 2018-06-01 NOTE — Progress Notes (Addendum)
Patient will DC to: [facility] Anticipated DC date: 06/01/18 Family notified: Surveyor, quantity by: Anne Ng  Per MD patient ready for DC to [facility] . RN, patient, patient's family, and facility notified of DC. Discharge Summary sent to facility multiple times after Addendum from Dr. Marlou Sa to ensure today's date is on dc summary. RN given number for report [phone] Room 104B. DC packet on chart. Anne Ng will be in main front entrance of hospital at 4:00 pm to pick up patient.    CSW signing off.  [place], [place]

## 2018-06-02 NOTE — Care Management Important Message (Signed)
Important Message  Patient Details  Name: Lisa James MRN: [id number] Date of Birth: 07/04/1928   Medicare Important Message Given:  Yes    Mina Babula Montine Circle 06/02/2018, 9:34 AM

## 2018-06-04 ENCOUNTER — Other Ambulatory Visit: Payer: Medicare Other

## 2018-06-04 ENCOUNTER — Ambulatory Visit: Payer: Medicare Other

## 2018-06-05 ENCOUNTER — Telehealth: Payer: Self-pay

## 2018-06-05 ENCOUNTER — Encounter (HOSPITAL_COMMUNITY): Payer: Self-pay | Admitting: Emergency Medicine

## 2018-06-05 ENCOUNTER — Observation Stay (HOSPITAL_COMMUNITY)
Admission: EM | Admit: 2018-06-05 | Discharge: 2018-06-09 | Disposition: A | Discharge status: Skilled Nursing Facility | Payer: Medicare Other | Attending: Family Medicine | Admitting: Family Medicine

## 2018-06-05 DIAGNOSIS — R2689 Other abnormalities of gait and mobility: Secondary | ICD-10-CM | POA: Diagnosis not present

## 2018-06-05 DIAGNOSIS — E559 Vitamin D deficiency, unspecified: Secondary | ICD-10-CM | POA: Diagnosis not present

## 2018-06-05 DIAGNOSIS — I5032 Chronic diastolic (congestive) heart failure: Secondary | ICD-10-CM

## 2018-06-05 DIAGNOSIS — N183 Chronic kidney disease, stage 3 unspecified: Secondary | ICD-10-CM | POA: Diagnosis present

## 2018-06-05 DIAGNOSIS — E1151 Type 2 diabetes mellitus with diabetic peripheral angiopathy without gangrene: Secondary | ICD-10-CM | POA: Diagnosis not present

## 2018-06-05 DIAGNOSIS — Z79899 Other long term (current) drug therapy: Secondary | ICD-10-CM | POA: Diagnosis not present

## 2018-06-05 DIAGNOSIS — E785 Hyperlipidemia, unspecified: Secondary | ICD-10-CM | POA: Insufficient documentation

## 2018-06-05 DIAGNOSIS — F0390 Unspecified dementia without behavioral disturbance: Secondary | ICD-10-CM | POA: Insufficient documentation

## 2018-06-05 DIAGNOSIS — S42301A Unspecified fracture of shaft of humerus, right arm, initial encounter for closed fracture: Secondary | ICD-10-CM | POA: Insufficient documentation

## 2018-06-05 DIAGNOSIS — D693 Immune thrombocytopenic purpura: Secondary | ICD-10-CM | POA: Diagnosis present

## 2018-06-05 DIAGNOSIS — Z8571 Personal history of Hodgkin lymphoma: Secondary | ICD-10-CM | POA: Diagnosis not present

## 2018-06-05 DIAGNOSIS — X58XXXA Exposure to other specified factors, initial encounter: Secondary | ICD-10-CM | POA: Insufficient documentation

## 2018-06-05 DIAGNOSIS — K219 Gastro-esophageal reflux disease without esophagitis: Secondary | ICD-10-CM | POA: Diagnosis present

## 2018-06-05 DIAGNOSIS — I130 Hypertensive heart and chronic kidney disease with heart failure and stage 1 through stage 4 chronic kidney disease, or unspecified chronic kidney disease: Secondary | ICD-10-CM | POA: Diagnosis not present

## 2018-06-05 DIAGNOSIS — D649 Anemia, unspecified: Secondary | ICD-10-CM | POA: Diagnosis not present

## 2018-06-05 DIAGNOSIS — I10 Essential (primary) hypertension: Secondary | ICD-10-CM | POA: Diagnosis present

## 2018-06-05 DIAGNOSIS — F329 Major depressive disorder, single episode, unspecified: Secondary | ICD-10-CM | POA: Insufficient documentation

## 2018-06-05 DIAGNOSIS — I252 Old myocardial infarction: Secondary | ICD-10-CM | POA: Insufficient documentation

## 2018-06-05 DIAGNOSIS — E1122 Type 2 diabetes mellitus with diabetic chronic kidney disease: Secondary | ICD-10-CM | POA: Diagnosis not present

## 2018-06-05 DIAGNOSIS — Z951 Presence of aortocoronary bypass graft: Secondary | ICD-10-CM | POA: Diagnosis not present

## 2018-06-05 DIAGNOSIS — C8590 Non-Hodgkin lymphoma, unspecified, unspecified site: Secondary | ICD-10-CM | POA: Diagnosis present

## 2018-06-05 DIAGNOSIS — I2510 Atherosclerotic heart disease of native coronary artery without angina pectoris: Secondary | ICD-10-CM | POA: Diagnosis not present

## 2018-06-05 DIAGNOSIS — E871 Hypo-osmolality and hyponatremia: Secondary | ICD-10-CM | POA: Diagnosis present

## 2018-06-05 LAB — CBC
HEMATOCRIT: 22.9 % — AB (ref 36.0–46.0)
Hemoglobin: 7.2 g/dL — ABNORMAL LOW (ref 12.0–15.0)
MCH: 35 pg — AB (ref 26.0–34.0)
MCHC: 31.4 g/dL (ref 30.0–36.0)
MCV: 111.2 fL — AB (ref 80.0–100.0)
NRBC: 0 % (ref 0.0–0.2)
Platelets: 93 10*3/uL — ABNORMAL LOW (ref 150–400)
RBC: 2.06 MIL/uL — AB (ref 3.87–5.11)
RDW: 20 % — ABNORMAL HIGH (ref 11.5–15.5)
WBC: 6 10*3/uL (ref 4.0–10.5)

## 2018-06-05 LAB — BASIC METABOLIC PANEL
ANION GAP: 8 (ref 5–15)
BUN: 14 mg/dL (ref 8–23)
CHLORIDE: 97 mmol/L — AB (ref 98–111)
CO2: 27 mmol/L (ref 22–32)
Calcium: 8.8 mg/dL — ABNORMAL LOW (ref 8.9–10.3)
Creatinine, Ser: 1 mg/dL (ref 0.44–1.00)
GFR calc Af Amer: 57 mL/min — ABNORMAL LOW (ref >60)
GFR calc non Af Amer: 49 mL/min — ABNORMAL LOW (ref >60)
GLUCOSE: 119 mg/dL — AB (ref 70–99)
POTASSIUM: 3.8 mmol/L (ref 3.5–5.1)
Sodium: 132 mmol/L — ABNORMAL LOW (ref 135–145)

## 2018-06-05 LAB — PREPARE RBC (CROSSMATCH)

## 2018-06-05 MED ORDER — CARVEDILOL 6.25 MG PO TABS
6.2500 mg | ORAL_TABLET | Freq: Two times a day (BID) | ORAL | Status: DC
  Administered 2018-06-05 – 2018-06-09 (×8): 6.25 mg via ORAL
  Filled 2018-06-05 (×8): qty 1

## 2018-06-05 MED ORDER — SODIUM CHLORIDE 0.9% FLUSH
3.0000 mL | Freq: Two times a day (BID) | INTRAVENOUS | Status: DC
  Administered 2018-06-06 – 2018-06-09 (×3): 3 mL via INTRAVENOUS

## 2018-06-05 MED ORDER — SODIUM CHLORIDE 1 G PO TABS
1.0000 g | ORAL_TABLET | Freq: Three times a day (TID) | ORAL | Status: DC
  Administered 2018-06-06 (×2): 1 g via ORAL
  Filled 2018-06-05 (×3): qty 1

## 2018-06-05 MED ORDER — TRAZODONE HCL 50 MG PO TABS
50.0000 mg | ORAL_TABLET | Freq: Every evening | ORAL | Status: DC | PRN
  Administered 2018-06-05 – 2018-06-06 (×2): 50 mg via ORAL
  Filled 2018-06-05 (×4): qty 1

## 2018-06-05 MED ORDER — HYDROCODONE-ACETAMINOPHEN 5-325 MG PO TABS: 1.0000 | ORAL_TABLET | Freq: Four times a day (QID) | ORAL | Status: DC | PRN

## 2018-06-05 MED ORDER — CALCIUM-VITAMIN D 500-200 MG-UNIT PO TABS: 1.0000 | ORAL_TABLET | Freq: Every day | ORAL | Status: DC

## 2018-06-05 MED ORDER — VITAMIN D 1000 UNITS PO CAPS: 1000.0000 [IU] | ORAL_CAPSULE | Freq: Every day | ORAL | Status: DC

## 2018-06-05 MED ORDER — PRESERVISION AREDS PO CAPS: ORAL_CAPSULE | Freq: Every day | ORAL | Status: DC

## 2018-06-05 MED ORDER — VITAMIN D 25 MCG (1000 UNIT) PO TABS
1000.0000 [IU] | ORAL_TABLET | Freq: Every day | ORAL | Status: DC
  Administered 2018-06-05 – 2018-06-08 (×3): 1000 [IU] via ORAL
  Filled 2018-06-05 (×3): qty 1

## 2018-06-05 MED ORDER — CALCIUM CARBONATE-VITAMIN D 500-200 MG-UNIT PO TABS
1.0000 | ORAL_TABLET | Freq: Every day | ORAL | Status: DC
  Administered 2018-06-06 – 2018-06-09 (×4): 1 via ORAL
  Filled 2018-06-05 (×4): qty 1

## 2018-06-05 MED ORDER — SERTRALINE HCL 50 MG PO TABS
25.0000 mg | ORAL_TABLET | Freq: Every day | ORAL | Status: DC
  Administered 2018-06-06 – 2018-06-09 (×4): 25 mg via ORAL
  Filled 2018-06-05 (×4): qty 1

## 2018-06-05 MED ORDER — PROSIGHT PO TABS
1.0000 | ORAL_TABLET | Freq: Every day | ORAL | Status: DC
  Administered 2018-06-06 – 2018-06-09 (×4): 1 via ORAL
  Filled 2018-06-05 (×4): qty 1

## 2018-06-05 MED ORDER — ATORVASTATIN CALCIUM 20 MG PO TABS
20.0000 mg | ORAL_TABLET | Freq: Every evening | ORAL | Status: DC
  Administered 2018-06-05 – 2018-06-08 (×4): 20 mg via ORAL
  Filled 2018-06-05 (×4): qty 1

## 2018-06-05 MED ORDER — METHOCARBAMOL 500 MG PO TABS: 500.0000 mg | ORAL_TABLET | Freq: Four times a day (QID) | ORAL | Status: DC | PRN

## 2018-06-05 MED ORDER — BACID PO TABS: 2.0000 | ORAL_TABLET | Freq: Three times a day (TID) | ORAL | Status: DC

## 2018-06-05 MED ORDER — SODIUM CHLORIDE 0.9 % IV SOLN: 10.0000 mL/h | Freq: Once | INTRAVENOUS | Status: DC

## 2018-06-05 MED ORDER — ELTROMBOPAG OLAMINE 50 MG PO TABS: 50.0000 mg | ORAL_TABLET | Freq: Every day | ORAL | Status: DC

## 2018-06-05 MED ORDER — DOCUSATE SODIUM 100 MG PO CAPS: 100.0000 mg | ORAL_CAPSULE | Freq: Two times a day (BID) | ORAL | Status: DC

## 2018-06-05 NOTE — Telephone Encounter (Signed)
Lisa James with [employer] [phone] with report that her hemoglobin is 6.4 today.

## 2018-06-05 NOTE — ED Provider Notes (Signed)
[facility] EMERGENCY DEPARTMENT Provider Note   CSN: [id number] Arrival date & time: 06/05/18  1541     History   Chief Complaint Chief Complaint  Patient presents with  . Abnormal Lab    HPI Lisa James is a 84 y.o. female.  Patient brought in from [facility] nursing facility for low hemoglobin.  They reported it was 6.3 or 4.  Patient with recent admissions to the hospital.  On October 24 she was admitted for hyponatremia fall and a right proximal humerus fracture.  Patient was then discharged back to collapse.  Brought back in on November 7 for surgical repair of the humerus fracture open reduction internal fixation.  This was done by Dr. Marlou Sa.  Patient was discharged back to collapse on November 11.  Apparently when she arrived to collapse according to her niece who has power of attorney stated that her hemoglobin was still in the 10 range which where it was preoperatively.  But since that time it is slowly drifted down.  No evidence of any GI bleed.  Patient without any significant symptoms.  But today apparently was in the low 6 range.  Referred in for blood transfusion.  Patient also has a history of non-Hodgkin's lymphoma long-standing is currently on oral  Proxicin.       Past Medical History:  Diagnosis Date  . Acute MI, lateral wall ([place])   . Aortic stenosis   . Chronic kidney disease   . Chronic systolic heart failure ([place])   . Coronary artery disease    sees Dr Claiborne Billings every 6 months  . Dementia ([place])   . Diabetes ([place])   . DM (diabetes mellitus) ([place])    type 2 ; diet controlled  . Heart disease   . HTN (hypertension)   . Immune thrombocytopenic purpura ([place]) 01/21/2013  . Ischemic heart disease   . Kidney disease   . Mitral regurgitation   . Non Hodgkin's lymphoma ([place])    of the throat  . Subdural hematoma, post-traumatic ([place]) 2015   sees Dr Sherwood Gambler every 6 months  . Thrombocytopenia ([place])    sees Dr Marin Olp every 2 weeks ;receives  Nplate prn    Patient Active Problem List   Diagnosis Date Noted  . Closed fracture of right proximal humerus 05/30/2018  . Proximal humerus fracture 05/28/2018  . Pressure injury of skin 05/15/2018  . Fall 05/14/2018  . Closed comminuted right humeral fracture 05/14/2018  . UTI (urinary tract infection) 05/14/2018  . Hyponatremia 05/14/2018  . Leucocytosis 05/14/2018  . Altered mental status 05/13/2015  . Idiopathic thrombocytopenic purpura ([place]) 07/06/2013  . Aortic valve stenosis 06/19/2013  . Immune thrombocytopenic purpura ([place]) 01/21/2013  . GERD (gastroesophageal reflux disease) 12/17/2012  . Type II or unspecified type diabetes mellitus with renal manifestations, not stated as uncontrolled(250.40) 12/10/2012  . Chronic kidney disease (CKD), stage III (moderate) ([place]) 11/22/2012  . Diabetes mellitus ([place]) 07/03/2011  . Depression 07/03/2011  . Non-Hodgkin's lymphoma ([place]) 07/03/2011  . CAD (coronary artery disease) 07/03/2011  . PAD (peripheral artery disease) ([place]) 07/03/2011  . Vitamin D deficiency 07/03/2011  . Subdural hematoma ([place]) 07/03/2011  . Confusion 05/12/2011  . Subdural hematoma, acute ([place]) 05/12/2011  . HYPERLIPIDEMIA TYPE IIB / III 05/10/2009  . OLD MYOCARDIAL INFARCTION 05/10/2009  . Occlusion and stenosis of carotid artery without mention of cerebral infarction 05/10/2009  . HYPERTENSION, BENIGN 11/03/2008  . MITRAL REGURGITATION 10/29/2008  . ISCHEMIC HEART DISEASE 10/29/2008  . SYSTOLIC HEART  FAILURE, CHRONIC 10/29/2008    Past Surgical History:  Procedure Laterality Date  . BONE MARROW BIOPSY  11/22/2002   left posterior iliac crest bone marrow biopsy and aspirate  . CATARACT EXTRACTION W/ INTRAOCULAR LENS  IMPLANT, BILATERAL Bilateral   . CORONARY ARTERY BYPASS GRAFT  11/20/2007   x5  . EYE SURGERY    . ORIF HUMERUS FRACTURE Right 05/28/2018   Procedure: RIGHT OPEN REDUCTION INTERNAL FIXATION (ORIF) PROXIMAL HUMERUS FRACTURE;  Surgeon: Meredith Pel, MD;  Location: [facility];  Service: Orthopedics;  Laterality: Right;  . submental lymph node excisional biopsy  10/22/2002     OB History   None      Home Medications    Prior to Admission medications   Medication Sig Start Date End Date Taking? Authorizing Provider  aspirin EC 81 MG EC tablet Take 1 tablet (81 mg total) by mouth 2 (two) times daily. 05/29/18   Meredith Pel, MD  atorvastatin (LIPITOR) 20 MG tablet TAKE 1 TABLET (20 MG TOTAL) BY MOUTH DAILY. Patient taking differently: Take 20 mg by mouth every evening.  01/29/16   Darlyne Russian, MD  Calcium Carbonate-Vitamin D (CALCIUM-VITAMIN D) 500-200 MG-UNIT per tablet Take 1 tablet by mouth daily.     [provider]  carvedilol (COREG) 6.25 MG tablet Take 1 tablet (6.25 mg total) by mouth 2 (two) times daily with a meal. 07/28/15   Daub, Loura Back, MD  Cholecalciferol (VITAMIN D) 1000 UNITS capsule Take 1,000 Units by mouth at bedtime.     [provider]  docusate sodium (COLACE) 100 MG capsule Take 1 capsule (100 mg total) by mouth 2 (two) times daily. 06/01/18   Meredith Pel, MD  HYDROcodone-acetaminophen (NORCO/VICODIN) 5-325 MG tablet Take 1 tablet by mouth every 6 (six) hours as needed for moderate pain (pain score 4-6). 05/29/18   Meredith Pel, MD  lactobacillus acidophilus (BACID) TABS tablet Take 2 tablets by mouth 3 (three) times daily. 05/26/18   Mikhail, Velta Addison, DO  methocarbamol (ROBAXIN) 500 MG tablet Take 1 tablet (500 mg total) by mouth every 6 (six) hours as needed for muscle spasms. 05/29/18   Meredith Pel, MD  Multiple Vitamins-Minerals (PRESERVISION AREDS PO) Take 1 tablet by mouth daily.    [provider]  PROMACTA 50 MG tablet TAKE 1 TABLET (50 MG TOTAL) BY MOUTH DAILY AT 6 AM. TAKE ON AN EMPTY STOMACH 1 HR BEFORE OR 2 HRS AFTER FOOD, SEPARATE FROM VITAMINS Patient taking differently: Take 50 mg by mouth daily. Take on empty stomach 1 hr before or 2 hrs  after food. Separate from vitamins. 05/27/18   Truitt Merle, MD  sertraline (ZOLOFT) 25 MG tablet Take 25 mg by mouth daily.    [provider]  sodium chloride 1 g tablet Take 1 tablet (1 g total) by mouth 3 (three) times daily with meals. 05/29/18   Meredith Pel, MD  traZODone (DESYREL) 50 MG tablet Take 50-100 mg by mouth at bedtime as needed for sleep.    [provider]    Family History Family History  Problem Relation Age of Onset  . Heart attack Father   . Diabetes Father     Social History Social History   Tobacco Use  . Smoking status: Never Smoker  . Smokeless tobacco: Never Used  . Tobacco comment: never used tobacco.  Substance Use Topics  . Alcohol use: No    Alcohol/week: 0.0 standard drinks  . Drug  use: No     Allergies   Ace inhibitors   Review of Systems Review of Systems  Unable to perform ROS: Dementia     Physical Exam Updated Vital Signs BP (!) 165/73 (BP Location: Left Arm)   Pulse 85   Temp 98.7 F (37.1 C)   Resp (!) 21   Ht 1.473 m (_0 )   Wt 53.1 kg   LMP  (LMP Unknown)   SpO2 100%   BMI 24.45 kg/m   Physical Exam  Constitutional: She appears well-developed and well-nourished. No distress.  HENT:  Head: Normocephalic and atraumatic.  Mouth/Throat: Oropharynx is clear and moist.  Eyes: Pupils are equal, round, and reactive to light. Conjunctivae and EOM are normal.  Neck: Normal range of motion. Neck supple.  Cardiovascular: Normal rate and regular rhythm.  Murmur heard. Pulmonary/Chest: Effort normal and breath sounds normal. No respiratory distress.  Abdominal: Soft. Bowel sounds are normal. There is no tenderness.  Musculoskeletal: Normal range of motion. She exhibits edema.  Closed bandage right proximal arm shoulder area.  With some old bruising in that area.  Some swelling to the forearm no significant swelling to the hand.  Radial pulse 2+.  Bilateral lower extremity edema.  Neurological: She is  alert. No cranial nerve deficit or sensory deficit. She exhibits normal muscle tone.  Patient with known history of dementia according to her niece baseline dementia.  Patient has very significant memory loss particular short-term memory.  But patient able to follow commands.  Skin: Skin is warm. No rash noted.  Nursing note and vitals reviewed.    ED Treatments / Results  Labs (all labs ordered are listed, but only abnormal results are displayed) Labs Reviewed  CBC - Abnormal; Notable for the following components:      Result Value   RBC 2.06 (*)    Hemoglobin 7.2 (*)    HCT 22.9 (*)    MCV 111.2 (*)    MCH 35.0 (*)    RDW 20.0 (*)    Platelets 93 (*)    All other components within normal limits  BASIC METABOLIC PANEL - Abnormal; Notable for the following components:   Sodium 132 (*)    Chloride 97 (*)    Glucose, Bld 119 (*)    Calcium 8.8 (*)    GFR calc non Af Amer 49 (*)    GFR calc Af Amer 57 (*)    All other components within normal limits  TYPE AND SCREEN  PREPARE RBC (CROSSMATCH)    EKG None  Radiology No results found.  Procedures Procedures (including critical care time)  Medications Ordered in ED Medications  0.9 %  sodium chloride infusion (has no administration in time range)     Initial Impression / Assessment and Plan / ED Course  I have reviewed the triage vital signs and the nursing notes.  Pertinent labs & imaging results that were available during my care of the patient were reviewed by me and considered in my medical decision making (see chart for details).    Patient stable here her memory loss is baseline.  Hemoglobin here 7.2.  Platelets are okay a little bit on the low side her niece stated that they are sometimes low.  But it is 92,000.  Patient will be transfused.  2 units ordered.  [facility] contacted for the admission.  They have seen her and will have her admitted.  Quick bruising through her charts show no evidence  that  she is been a DNR in the past did not come with any DNR paperwork.  Her shoulder surgical site appears to be healing well.  Patient is to keep her sling on she is not to raise her right arm above her head.  Final Clinical Impressions(s) / ED Diagnoses   Final diagnoses:  Anemia, unspecified type    ED Discharge Orders    None       Fredia Sorrow, MD 06/05/18 [id number]

## 2018-06-05 NOTE — Telephone Encounter (Signed)
Per Dr. Burr Medico spoke with Claiborne Billings at [facility] facility regarding hemoglobin 6.4, instructed her that patient should go to the ED for evaluation and blood transfusion.    Attempted to call patient's niece unable to reach.

## 2018-06-05 NOTE — ED Triage Notes (Signed)
Per EMS pt from [facility] and brought in to get a transfusion for low Hemoglobin.  NAD noted at this time. Pt has history of Dementia

## 2018-06-05 NOTE — H&P (Signed)
History and Physical    Lisa James:[id number] DOB: Mar 09, 1928 DOA: 06/05/2018  PCP: Merrilee Seashore, MD Consultants:  none Patient coming from: SNF  Chief Complaint: Low hemoglobin  HPI: Lisa James is a 84 y.o. female with medical history significant for dementia, coronary artery disease, hypertension, non-Hodgkin's lymphoma who lives in a skilled nursing facility who sustained a right humerus fracture on October 24 after falling.  She was brought to the ED where she was found to have a right comminuted humeral shaft fracture as well as a UTI.  She underwent open reduction internal fixation on November 7 by Dr. Marlou Sa.  She was discharged back to her SNF on November 11.  According to patient's niece who is her power of attorney, her hemoglobin upon arrival back to her nursing home was still in the 10 range which is where it was preoperatively.  However today her hemoglobin was 6.4. Patient denies any symptoms; specifically, no chest pain, dyspnea, palpitations, HA, weakness, N/V/D, melena, hematochezia. Her L arm is in a sling and she is not complaining of any pain in her arm or elsewhere.  Hemoglobin April 23, 2018 was 13.5.  On October 24, the day of her ORIF, it was 14.3.  It dropped to 7.9 on 1026.  She was transfused with a resultant hemoglobin of 10.1.  Hemoglobin has remained stable in the nines to tens since that time until today when her hemoglobin was noted to be 6.4. She denies melena, hematochezia, abdominal pain, nausea or vomiting.  During her admission in late October, she was also hyponatremic at 120 which improved to 126 at discharge after starting sodium tablets 3 times daily.  She was also discharged with Lasix 20 mg daily for 1 week.     ED Course: Hemodynamically stable Hgb was 7.2, MCV 111.2 Na 132 She was transfused 2 units of PRBCs Pleasantly confused, poor short-term memory  Review of Systems: As per HPI; otherwise review of systems reviewed and  negative.   Ambulatory Status:  Ambulates without assistance  Past Medical History:  Diagnosis Date  . Acute MI, lateral wall ([place])   . Aortic stenosis   . Chronic kidney disease   . Chronic systolic heart failure ([place])   . Coronary artery disease    sees Dr Claiborne Billings every 6 months  . Dementia ([place])   . Diabetes ([place])   . DM (diabetes mellitus) ([place])    type 2 ; diet controlled  . Heart disease   . HTN (hypertension)   . Immune thrombocytopenic purpura ([place]) 01/21/2013  . Ischemic heart disease   . Kidney disease   . Mitral regurgitation   . Non Hodgkin's lymphoma ([place])    of the throat  . Subdural hematoma, post-traumatic ([place]) 2015   sees Dr Sherwood Gambler every 6 months  . Thrombocytopenia ([place])    sees Dr Marin Olp every 2 weeks ;receives Nplate prn    Past Surgical History:  Procedure Laterality Date  . BONE MARROW BIOPSY  11/22/2002   left posterior iliac crest bone marrow biopsy and aspirate  . CATARACT EXTRACTION W/ INTRAOCULAR LENS  IMPLANT, BILATERAL Bilateral   . CORONARY ARTERY BYPASS GRAFT  11/20/2007   x5  . EYE SURGERY    . ORIF HUMERUS FRACTURE Right 05/28/2018   Procedure: RIGHT OPEN REDUCTION INTERNAL FIXATION (ORIF) PROXIMAL HUMERUS FRACTURE;  Surgeon: Meredith Pel, MD;  Location: [facility];  Service: Orthopedics;  Laterality: Right;  . submental lymph node excisional biopsy  10/22/2002  Social History   Socioeconomic History  . Marital status: Widowed    Spouse name: Not on file  . Number of children: 2  . Years of education: Not on file  . Highest education level: Not on file  Occupational History    Comment: retired  Scientific laboratory technician  . Financial resource strain: Not on file  . Food insecurity:    Worry: Not on file    Inability: Not on file  . Transportation needs:    Medical: Not on file    Non-medical: Not on file  Tobacco Use  . Smoking status: Never Smoker  . Smokeless tobacco: Never Used  . Tobacco comment: never used tobacco.  Substance and  Sexual Activity  . Alcohol use: No    Alcohol/week: 0.0 standard drinks  . Drug use: No  . Sexual activity: Never    Birth control/protection: Abstinence  Lifestyle  . Physical activity:    Days per week: Not on file    Minutes per session: Not on file  . Stress: Not on file  Relationships  . Social connections:    Talks on phone: Not on file    Gets together: Not on file    Attends religious service: Not on file    Active member of club or organization: Not on file    Attends meetings of clubs or organizations: Not on file    Relationship status: Not on file  . Intimate partner violence:    Fear of current or ex partner: Not on file    Emotionally abused: Not on file    Physically abused: Not on file    Forced sexual activity: Not on file  Other Topics Concern  . Not on file  Social History Narrative   10/25/16 lives at [facility]  [phone]   Caffeine - sodas, 2-3 daily    Allergies  Allergen Reactions  . Ace Inhibitors Cough    Family History  Problem Relation Age of Onset  . Heart attack Father   . Diabetes Father     Prior to Admission medications   Medication Sig Start Date End Date Taking? Authorizing Provider  aspirin EC 81 MG EC tablet Take 1 tablet (81 mg total) by mouth 2 (two) times daily. 05/29/18   Meredith Pel, MD  atorvastatin (LIPITOR) 20 MG tablet TAKE 1 TABLET (20 MG TOTAL) BY MOUTH DAILY. Patient taking differently: Take 20 mg by mouth every evening.  01/29/16   Darlyne Russian, MD  Calcium Carbonate-Vitamin D (CALCIUM-VITAMIN D) 500-200 MG-UNIT per tablet Take 1 tablet by mouth daily.     [provider]  carvedilol (COREG) 6.25 MG tablet Take 1 tablet (6.25 mg total) by mouth 2 (two) times daily with a meal. 07/28/15   Daub, Loura Back, MD  Cholecalciferol (VITAMIN D) 1000 UNITS capsule Take 1,000 Units by mouth at bedtime.     [provider]  docusate sodium (COLACE) 100 MG capsule Take 1 capsule (100 mg total) by  mouth 2 (two) times daily. 06/01/18   Meredith Pel, MD  HYDROcodone-acetaminophen (NORCO/VICODIN) 5-325 MG tablet Take 1 tablet by mouth every 6 (six) hours as needed for moderate pain (pain score 4-6). 05/29/18   Meredith Pel, MD  lactobacillus acidophilus (BACID) TABS tablet Take 2 tablets by mouth 3 (three) times daily. 05/26/18   Mikhail, Velta Addison, DO  methocarbamol (ROBAXIN) 500 MG tablet Take 1 tablet (500 mg total) by mouth every 6 (six) hours as needed for  muscle spasms. 05/29/18   Meredith Pel, MD  Multiple Vitamins-Minerals (PRESERVISION AREDS PO) Take 1 tablet by mouth daily.    [provider]  PROMACTA 50 MG tablet TAKE 1 TABLET (50 MG TOTAL) BY MOUTH DAILY AT 6 AM. TAKE ON AN EMPTY STOMACH 1 HR BEFORE OR 2 HRS AFTER FOOD, SEPARATE FROM VITAMINS Patient taking differently: Take 50 mg by mouth daily. Take on empty stomach 1 hr before or 2 hrs after food. Separate from vitamins. 05/27/18   Truitt Merle, MD  sertraline (ZOLOFT) 25 MG tablet Take 25 mg by mouth daily.    [provider]  sodium chloride 1 g tablet Take 1 tablet (1 g total) by mouth 3 (three) times daily with meals. 05/29/18   Meredith Pel, MD  traZODone (DESYREL) 50 MG tablet Take 50-100 mg by mouth at bedtime as needed for sleep.    [provider]    Physical Exam: Vitals:   06/05/18 1808 06/05/18 1815 06/05/18 1820 06/05/18 1820  BP: (!) 135/108 (!) 154/72 (!) 165/73 (!) 165/73  Pulse: 85 85 85 85  Resp: (!) 22 (!) 21 (!) 23 (!) 21  Temp: 98.6 F (37 C)   98.7 F (37.1 C)  TempSrc: Oral Oral    SpO2: 100% 100% 100% 100%  Weight:      Height:         . General: Elderly female, lying in bed. Appears calm and comfortable and is in NAD. Pale. . Eyes:  PERRL, EOMI, pale conjunctivae, iris . ENT:  grossly normal hearing, lips & tongue, mmm . Neck:  supple, no lymphadenopathy . Cardiovascular:  nL S1, S2, normal rate, reg rhythm, no murmur. Marland Kitchen Respiratory:   CTA  bilaterally with no wheezes/rales/rhonchi.  Normal respiratory effort. . Abdomen:  soft, NT, ND, NABS . Back: grossly normal alignment . Skin:  no rash or lesions seen on limited exam . Musculoskeletal:   Extensive ecchymoses from shoulder to mid/lower back on R side. Nontender to palpation. She has increased edema in this area as well as her RUE; grossly normal tone BUE/BLE, good ROM, no bony abnormality or obvious joint deformity . Lower extremities:  2+ LE edema.  Limited foot exam with no ulcerations.  2+ distal pulses. Marland Kitchen Psychiatric:  grossly normal mood and affect, she is oriented to place, not to time/date. She has to be frequently reminded of why she is in the hospital; poor short-term memory . Neurologic:  CN 2-12 grossly intact, moves all extremities in coordinated fashion except for R shoulder which is in a sling, sensation intact, Patellar DTRs 2+ and symmetric    Radiological Exams on Admission: No results found.  EKG: none  Labs on Admission: I have personally reviewed the available labs and imaging studies at the time of the admission.  Pertinent labs:  Sodium 132 potassium 3.8 chloride 97 CO2 27 BUN 14 creatinine 1.0 which is baseline, glucose 119 calcium 8.8 WBC 6.0, hemoglobin 7.2, MCV 111.2, platelets 93, which is not far below her baseline  Last TTE: 08/14/2016 - Normal LV size with mild LV hypertrophy. EF 60-65%. Moderate   diastolic dysfunction. Normal RV size and systolic function.   Severely calcified, trileaflet aortic valve with mild to moderate   stenosis and mild regurgitation. Moderate mitral regurgitation.   Mild pulmonary hypertension.   Assessment/Plan Principal Problem:   Acute on chronic anemia Active Problems:   HYPERTENSION, BENIGN   Non-Hodgkin's lymphoma (HCC)   CAD (coronary artery disease)  Chronic kidney disease (CKD), stage III (moderate) (HCC)   GERD (gastroesophageal reflux disease)   Immune thrombocytopenic purpura (HCC)    Hyponatremia   Chronic diastolic CHF (congestive heart failure) (HCC)   Acute on chronic anemia: per report, pt has had no s/s GI blood loss. No other obvious source of bleeding other than the extensive ecchymoses in her R arm and down her R back/side. Her thrombocytopenia and ASA use put her at higher risk of bleeding/bruising. No evidence on exam or by history of bleeding elsewhere. She has already begun receiving prbc so anemia labs not likely to be high yield. -f/u Hgb after transfusion of 2 u prbc -monitor platelets  -hemoccult stool  Recent R humerus fx s/p ORIF -cont sling, do not elevate R arm over head -cont norco, robaxin prn  Hypertension, CAD, chronic diastolic CHF -cont coreg 5.57 mg BID -cont statin -hold ASA given anemia / ecchymoses  Hyponatremia, chronic. Actually much improved today at 132 -cont salt tablets -daily BMP  CKD3 -creatinine is at baseline -daily BMP  NHL -cont promacta -f/u as scheduled as outpatient  DVT prophylaxis: SCDs Code Status:  Full; unable to contact family to verify; there is some mention that she was DNR in the past; will need to determine code status in the morning Family Communication: none  Disposition Plan: SNF once clinically improved Consults called: none  Admission status: It is my clinical opinion that referral for OBSERVATION is reasonable and necessary in this patient based on the above information provided. The aforementioned taken together are felt to place the patient at high risk for further clinical deterioration. However it is anticipated that the patient may be medically stable for discharge from the hospital within 24 to 48 hours.     Janora Norlander MD [facility]  If note is complete, please contact covering daytime or nighttime physician. [email or web address] Password [facility]  06/05/2018, 6:35 PM

## 2018-06-06 ENCOUNTER — Other Ambulatory Visit: Payer: Self-pay

## 2018-06-06 DIAGNOSIS — D649 Anemia, unspecified: Secondary | ICD-10-CM

## 2018-06-06 DIAGNOSIS — D693 Immune thrombocytopenic purpura: Secondary | ICD-10-CM

## 2018-06-06 DIAGNOSIS — I5032 Chronic diastolic (congestive) heart failure: Secondary | ICD-10-CM | POA: Diagnosis not present

## 2018-06-06 DIAGNOSIS — I10 Essential (primary) hypertension: Secondary | ICD-10-CM

## 2018-06-06 DIAGNOSIS — K219 Gastro-esophageal reflux disease without esophagitis: Secondary | ICD-10-CM | POA: Diagnosis not present

## 2018-06-06 DIAGNOSIS — E871 Hypo-osmolality and hyponatremia: Secondary | ICD-10-CM

## 2018-06-06 LAB — CBC
HCT: 31.6 % — ABNORMAL LOW (ref 36.0–46.0)
Hemoglobin: 10.2 g/dL — ABNORMAL LOW (ref 12.0–15.0)
MCH: 31.5 pg (ref 26.0–34.0)
MCHC: 32.3 g/dL (ref 30.0–36.0)
MCV: 97.5 fL (ref 80.0–100.0)
Platelets: 65 10*3/uL — ABNORMAL LOW (ref 150–400)
RBC: 3.24 MIL/uL — ABNORMAL LOW (ref 3.87–5.11)
RDW: 21.8 % — ABNORMAL HIGH (ref 11.5–15.5)
WBC: 5.2 10*3/uL (ref 4.0–10.5)
nRBC: 0 % (ref 0.0–0.2)

## 2018-06-06 LAB — BPAM RBC
BLOOD PRODUCT EXPIRATION DATE: 201912132359
Blood Product Expiration Date: 201912132359
ISSUE DATE / TIME: 201911152144
ISSUE DATE / TIME: 201911151754
UNIT TYPE AND RH: 5100
UNIT TYPE AND RH: 5100

## 2018-06-06 LAB — TYPE AND SCREEN
ABO/RH(D): O NEG
Antibody Screen: NEGATIVE
UNIT DIVISION: 0
Unit division: 0

## 2018-06-06 MED ORDER — BISACODYL 10 MG RE SUPP: 10.0000 mg | Freq: Every day | RECTAL | Status: DC | PRN

## 2018-06-06 NOTE — Evaluation (Signed)
Occupational Therapy Evaluation Patient Details Name: Lisa James MRN: [id number] DOB: Nov 28, 1927 Today's Date: 06/06/2018    History of Present Illness Lisa James is a 84 y.o. female with medical history significant for dementia, coronary artery disease, hypertension, non-Hodgkin's lymphoma who lives in a skilled nursing facility who sustained a right humerus fracture on October 24 after falling and had an ORIF on Nov 7. Admitted with acute on chronic anemia.   Clinical Impression   This 84 yo female admitted with above with also recent admission for RUE fx presents to acute OT with decreased mobility, decreased use of RUE, decreased balance all affecting her safety and independence with basic ADLs. She will benefit from acute OT with continued follow up at SNF to work towards a more independent level.     Follow Up Recommendations  SNF;Supervision/Assistance - 24 hour    Equipment Recommendations  Other (comment)(TBD at next venue)       Precautions / Restrictions Precautions Precautions: Fall;Shoulder Type of Shoulder Precautions: conservative Shoulder Interventions: Shoulder sling/immobilizer;At all times Precaution Comments: sling off for showering only Required Braces or Orthoses: Sling Restrictions Weight Bearing Restrictions: Yes RUE Weight Bearing: Non weight bearing      Mobility Bed Mobility Overal bed mobility: Needs Assistance Bed Mobility: Supine to Sit;Sit to Supine     Supine to sit: Min assist(use of rail) Sit to supine: Min assist(use of rail)      Transfers Overall transfer level: Needs assistance Equipment used: 1 person hand held assist Transfers: Sit to/from [facility] Sit to Stand: Min assist Stand pivot transfers: Min assist            Balance Overall balance assessment: Needs assistance Sitting-balance support: No upper extremity supported;Feet supported Sitting balance-Leahy Scale: Fair     Standing  balance support: Single extremity supported Standing balance-Leahy Scale: Poor Standing balance comment: Reliant on LUE and external support and A from therapist                           ADL either performed or assessed with clinical judgement   ADL Overall ADL's : Needs assistance/impaired Eating/Feeding: Minimal assistance Eating/Feeding Details (indicate cue type and reason): supported sitting Grooming: Moderate assistance Grooming Details (indicate cue type and reason): supported sitting Upper Body Bathing: Moderate assistance Upper Body Bathing Details (indicate cue type and reason): supported sitting Lower Body Bathing: Maximal assistance Lower Body Bathing Details (indicate cue type and reason): min A sit<>stand Upper Body Dressing : Total assistance Upper Body Dressing Details (indicate cue type and reason): supported sitting Lower Body Dressing: Total assistance Lower Body Dressing Details (indicate cue type and reason): min A sit<>stand Toilet Transfer: Minimal assistance;Stand-pivot;BSC   Toileting- Clothing Manipulation and Hygiene: Total assistance Toileting - Clothing Manipulation Details (indicate cue type and reason): min A sit<>stand             Vision Patient Visual Report: No change from baseline              Pertinent Vitals/Pain Pain Assessment: Faces Faces Pain Scale: Hurts little more Pain Location: bottom from trying to have bowel movement Pain Descriptors / Indicators: Sore Pain Intervention(s): Limited activity within patient's tolerance;Monitored during session;Other (comment)(RN came in to help)     Hand Dominance  right   Extremity/Trunk Assessment Upper Extremity Assessment RUE Deficits / Details: RUE was not in sling due to it getting soiled earlier and staff had washed it. It was  not dry so I reapplied it. RUE Coordination: decreased fine motor;decreased gross motor              Cognition Arousal/Alertness:  Awake/alert Behavior During Therapy: Anxious Overall Cognitive Status: History of cognitive impairments - at baseline                                 General Comments: Dementia at baseline - very pleasant; Able to follow 1 step commands consistently              Home Living Family/patient expects to be discharged to:: Skilled nursing facility                                                 OT Problem List: Decreased strength;Decreased range of motion;Impaired balance (sitting and/or standing);Impaired UE functional use      OT Treatment/Interventions: Self-care/ADL training;Balance training;Patient/family education;DME and/or AE instruction    OT Goals(Current goals can be found in the care plan section) Acute Rehab OT Goals Patient Stated Goal: to go back home OT Goal Formulation: With patient Time For Goal Achievement: 06/20/18 Potential to Achieve Goals: Good  OT Frequency: Min 2X/week   Barriers to D/C: Decreased caregiver support             AM-PAC PT "6 Clicks" Daily Activity     Outcome Measure Help from another person eating meals?: A Little Help from another person taking care of personal grooming?: A Lot Help from another person toileting, which includes using toliet, bedpan, or urinal?: Total Help from another person bathing (including washing, rinsing, drying)?: A Lot Help from another person to put on and taking off regular upper body clothing?: A Lot Help from another person to put on and taking off regular lower body clothing?: Total 6 Click Score: 11   End of Session Nurse Communication: Mobility status(needs A for bowel movement; NT: pt will need a new purewick once back to bed)  Activity Tolerance: Patient tolerated treatment well Patient left: in bed;with call bell/phone within reach;with bed alarm set  OT Visit Diagnosis: Unsteadiness on feet (R26.81);Other abnormalities of gait and mobility (R26.89);Muscle  weakness (generalized) (M62.81)                Time: 9485-4627 OT Time Calculation (min): 32 min Charges:  OT General Charges $OT Visit: 1 Visit OT Evaluation $OT Eval Moderate Complexity: 1 Mod OT Treatments $Self Care/Home Management : 8-22 mins  [address] Circle, OTR/L Acute [employer] Pager [phone] Office [phone]    Almon Register 06/06/2018, 4:20 PM

## 2018-06-06 NOTE — Progress Notes (Signed)
PROGRESS NOTE    Lisa James  IWL:[id number] DOB: 09-12-1927 DOA: 06/05/2018 PCP: Merrilee Seashore, MD   Brief Narrative: Lisa James is a 84 y.o. female with medical history significant for dementia, ITP, coronary artery disease, hypertension, non-Hodgkin's lymphoma. Patient presents secondary to an abnormal lab of anemia. Given 2 units of PRBC and platelets dropped.   Assessment & Plan:   Principal Problem:   Acute on chronic anemia Active Problems:   HYPERTENSION, BENIGN   Non-Hodgkin's lymphoma (HCC)   CAD (coronary artery disease)   Chronic kidney disease (CKD), stage III (moderate) (HCC)   GERD (gastroesophageal reflux disease)   Immune thrombocytopenic purpura (HCC)   Hyponatremia   Chronic diastolic CHF (congestive heart failure) (HCC)   Acute on chronic anemia Patient with a history of ITP on Promacta and recent surgery with some blood loss. Platelets down from yesterday. S/p 2 units PRBC -Repeat CBC to ensure   ITP Platelets trending down today. No obvious bleeding currently. -Repeat CBC  Right humerus fracture S/p ORIF on 11/7. -Continue sling  Essential hypertension -Continue Coreg  Chronic diastolic heart failure Stable. Last Transthoracic Echocardiogram significant for an EF of 60-65% and grade 2 diastolic dysfunction  CKD stage III Stable  Hyperlipidemia CAD -Continue Lipitor   Pressure Injury Documentation: Pressure Injury 05/15/18 Stage II -  Partial thickness loss of dermis presenting as a shallow open ulcer with a red, pink wound bed without slough. fissure slightly opened and red (Active)  05/15/18 0145   Location: Sacrum  Location Orientation: Medial  Staging: Stage II -  Partial thickness loss of dermis presenting as a shallow open ulcer with a red, pink wound bed without slough.  Wound Description (Comments): fissure slightly opened and red  Present on Admission: Yes     DVT prophylaxis: SCDs Code Status:   Code  Status: Full Code Family Communication: Niece on telephone Disposition Plan: Discharge to SNF likely in 24 hours   Consultants:   None  Procedures:   None  Antimicrobials:  None    Subjective: No issues.  Objective: Vitals:   06/05/18 2215 06/06/18 0145 06/06/18 0510 06/06/18 0807  BP: 130/89 (!) 141/88 (!) 121/56 130/65  Pulse: 88 77 76 77  Resp: 18 16 14 20   Temp: 98.4 F (36.9 C) 98.2 F (36.8 C) 98.5 F (36.9 C) 98.5 F (36.9 C)  TempSrc: Oral Oral Oral Oral  SpO2: 100% 100% 98% 95%  Weight:      Height:        Intake/Output Summary (Last 24 hours) at 06/06/2018 1240 Last data filed at 06/06/2018 0900 Gross per 24 hour  Intake 1125 ml  Output 0 ml  Net 1125 ml   Filed Weights   06/05/18 1548 06/05/18 2057  Weight: 53.1 kg 55.7 kg    Examination:  General exam: Appears calm and comfortable Respiratory system: Clear to auscultation. Respiratory effort normal. Cardiovascular system: S1 & S2 heard, RRR. 2/6 systolic murmur Gastrointestinal system: Abdomen is nondistended, soft and nontender. Normal bowel sounds heard. Central nervous system: Alert and oriented to person. Extremities: 2+ LE edema. No calf tenderness Skin: No cyanosis. No rashes Psychiatry: Judgement and insight appear normal. Mood & affect appropriate.     Data Reviewed: I have personally reviewed following labs and imaging studies  CBC: Recent Labs  Lab 06/05/18 1616 06/06/18 0539  WBC 6.0 5.2  HGB 7.2* 10.2*  HCT 22.9* 31.6*  MCV 111.2* 97.5  PLT 93* 65*   Basic Metabolic Panel:  Recent Labs  Lab 06/05/18 1616  NA 132*  K 3.8  CL 97*  CO2 27  GLUCOSE 119*  BUN 14  CREATININE 1.00  CALCIUM 8.8*   GFR: Estimated Creatinine Clearance: 28.7 mL/min (by C-G formula based on SCr of 1 mg/dL). Liver Function Tests: No results for input(s): AST, ALT, ALKPHOS, BILITOT, PROT, ALBUMIN in the last 168 hours. No results for input(s): LIPASE, AMYLASE in the last 168  hours. No results for input(s): AMMONIA in the last 168 hours. Coagulation Profile: No results for input(s): INR, PROTIME in the last 168 hours. Cardiac Enzymes: No results for input(s): CKTOTAL, CKMB, CKMBINDEX, TROPONINI in the last 168 hours. BNP (last 3 results) No results for input(s): PROBNP in the last 8760 hours. HbA1C: No results for input(s): HGBA1C in the last 72 hours. CBG: No results for input(s): GLUCAP in the last 168 hours. Lipid Profile: No results for input(s): CHOL, HDL, LDLCALC, TRIG, CHOLHDL, LDLDIRECT in the last 72 hours. Thyroid Function Tests: No results for input(s): TSH, T4TOTAL, FREET4, T3FREE, THYROIDAB in the last 72 hours. Anemia Panel: No results for input(s): VITAMINB12, FOLATE, FERRITIN, TIBC, IRON, RETICCTPCT in the last 72 hours. Sepsis Labs: No results for input(s): PROCALCITON, LATICACIDVEN in the last 168 hours.  Recent Results (from the past 240 hour(s))  MRSA PCR Screening     Status: None   Collection Time: 05/29/18  6:18 AM  Result Value Ref Range Status   MRSA by PCR NEGATIVE NEGATIVE Final    Comment:        The GeneXpert MRSA Assay (FDA approved for NASAL specimens only), is one component of a comprehensive MRSA colonization surveillance program. It is not intended to diagnose MRSA infection nor to guide or monitor treatment for MRSA infections. Performed at [facility], [place] [address], [place], [place] [address]          Radiology Studies: No results found.      Scheduled Meds: . atorvastatin  20 mg Oral QPM  . calcium-vitamin D  1 tablet Oral Q breakfast  . carvedilol  6.25 mg Oral BID WC  . cholecalciferol  1,000 Units Oral QHS  . eltrombopag  50 mg Oral Daily  . multivitamin  1 tablet Oral Daily  . sertraline  25 mg Oral Daily  . sodium chloride flush  3 mL Intravenous Q12H  . sodium chloride  1 g Oral TID WC   Continuous Infusions: . sodium chloride       LOS: 0 days     Cordelia Poche,  MD [facility] 06/06/2018, 12:40 PM  If 7PM-7AM, please contact night-coverage [email or web address]

## 2018-06-07 DIAGNOSIS — N183 Chronic kidney disease, stage 3 (moderate): Secondary | ICD-10-CM

## 2018-06-07 DIAGNOSIS — I5032 Chronic diastolic (congestive) heart failure: Secondary | ICD-10-CM | POA: Diagnosis not present

## 2018-06-07 DIAGNOSIS — D649 Anemia, unspecified: Secondary | ICD-10-CM | POA: Diagnosis not present

## 2018-06-07 DIAGNOSIS — I10 Essential (primary) hypertension: Secondary | ICD-10-CM | POA: Diagnosis not present

## 2018-06-07 LAB — CBC
HCT: 35.1 % — ABNORMAL LOW (ref 36.0–46.0)
Hemoglobin: 11.2 g/dL — ABNORMAL LOW (ref 12.0–15.0)
MCH: 31.4 pg (ref 26.0–34.0)
MCHC: 31.9 g/dL (ref 30.0–36.0)
MCV: 98.3 fL (ref 80.0–100.0)
NRBC: 0 % (ref 0.0–0.2)
PLATELETS: 61 10*3/uL — AB (ref 150–400)
RBC: 3.57 MIL/uL — ABNORMAL LOW (ref 3.87–5.11)
RDW: 21.6 % — ABNORMAL HIGH (ref 11.5–15.5)
WBC: 6.9 10*3/uL (ref 4.0–10.5)

## 2018-06-07 LAB — BASIC METABOLIC PANEL
Anion gap: 9 (ref 5–15)
BUN: 14 mg/dL (ref 8–23)
CALCIUM: 8.4 mg/dL — AB (ref 8.9–10.3)
CHLORIDE: 101 mmol/L (ref 98–111)
CO2: 24 mmol/L (ref 22–32)
CREATININE: 1.2 mg/dL — AB (ref 0.44–1.00)
GFR calc non Af Amer: 39 mL/min — ABNORMAL LOW (ref >60)
GFR, EST AFRICAN AMERICAN: 45 mL/min — AB (ref >60)
Glucose, Bld: 112 mg/dL — ABNORMAL HIGH (ref 70–99)
Potassium: 3.7 mmol/L (ref 3.5–5.1)
Sodium: 134 mmol/L — ABNORMAL LOW (ref 135–145)

## 2018-06-07 MED ORDER — HYDROCODONE-ACETAMINOPHEN 5-325 MG PO TABS: 1.0000 | ORAL_TABLET | Freq: Four times a day (QID) | ORAL | 0 refills | Status: DC | PRN

## 2018-06-07 NOTE — Progress Notes (Addendum)
CSW received call from nurse that patient medically ready to return to [facility], CSW reached out to [facility] who reports they will have to re do insurance auth with [facility]. Insurance Auth will be started first thing tomorrow morning 06/08/18.   [place], [place]

## 2018-06-07 NOTE — Discharge Summary (Signed)
Physician Discharge Summary  Lisa James KCL:[id number] DOB: 1928-06-29 DOA: 06/05/2018  PCP: Merrilee Seashore, MD  Admit date: 06/05/2018 Discharge date: 06/09/2018  Admitted From: SNF Disposition: SNF  Recommendations for Outpatient Follow-up:  1. Follow up with PCP in 1 week 2. Please obtain BMP/CBC in 3 days 3. Please follow up on the following pending results: None  Home Health: SNF Equipment/Devices: SNF  Discharge Condition: Stable CODE STATUS: Full code Diet recommendation: Heart healthy   Brief/Interim Summary:  Admission HPI written by Janora Norlander, MD   Chief Complaint: Low hemoglobin  HPI: Lisa James is a 84 y.o. female with medical history significant for dementia, coronary artery disease, hypertension, non-Hodgkin's lymphoma who lives in a skilled nursing facility who sustained a right humerus fracture on October 24 after falling.  She was brought to the ED where she was found to have a right comminuted humeral shaft fracture as well as a UTI.  She underwent open reduction internal fixation on November 7 by Dr. Marlou Sa.  She was discharged back to her SNF on November 11.  According to patient's niece who is her power of attorney, her hemoglobin upon arrival back to her nursing home was still in the 10 range which is where it was preoperatively.  However today her hemoglobin was 6.4. Patient denies any symptoms; specifically, no chest pain, dyspnea, palpitations, HA, weakness, N/V/D, melena, hematochezia. Her L arm is in a sling and she is not complaining of any pain in her arm or elsewhere.  Hemoglobin April 23, 2018 was 13.5.  On October 24, the day of her ORIF, it was 14.3.  It dropped to 7.9 on 1026.  She was transfused with a resultant hemoglobin of 10.1.  Hemoglobin has remained stable in the nines to tens since that time until today when her hemoglobin was noted to be 6.4. She denies melena, hematochezia, abdominal pain, nausea or  vomiting.  During her admission in late October, she was also hyponatremic at 120 which improved to 126 at discharge after starting sodium tablets 3 times daily.  She was also discharged with Lasix 20 mg daily for 1 week.     ED Course: Hemodynamically stable Hgb was 7.2, MCV 111.2 Na 132 She was transfused 2 units of PRBCs Pleasantly confused, poor short-term memory   Hospital course:  Acute on chronic anemia Patient with a history of ITP on Promacta and recent surgery with some blood loss. Platelets down which is likely contributing. S/p 2 units PRBC with stable hemoglobin. Recommend repeat CBC as an outpatient. Recommend holding aspirin until platelets are above 100,000.  ITP Platelets trended down, now stable. Repeat CBC as an outpatient. Resume home Promacta.  Right humerus fracture S/p ORIF on 11/7. Continued sling  Essential hypertension Continued Coreg  Chronic diastolic heart failure Stable. Last Transthoracic Echocardiogram significant for an EF of 60-65% and grade 2 diastolic dysfunction  CKD stage III Stable  Hyperlipidemia CAD Continued Lipitor  Discharge Diagnoses:  Principal Problem:   Acute on chronic anemia Active Problems:   HYPERTENSION, BENIGN   Non-Hodgkin's lymphoma (HCC)   CAD (coronary artery disease)   Chronic kidney disease (CKD), stage III (moderate) (HCC)   GERD (gastroesophageal reflux disease)   Immune thrombocytopenic purpura (HCC)   Hyponatremia   Chronic diastolic CHF (congestive heart failure) ([place])    Discharge Instructions  Discharge Instructions    Diet - low sodium heart healthy   Complete by:  As directed    Discharge instructions  Complete by:  As directed    Recheck CBC in 3-5 days. Hold aspirin for platelets less than 100,000   Increase activity slowly   Complete by:  As directed      Allergies as of 06/09/2018      Reactions   Ace Inhibitors Cough      Medication List    STOP taking these  medications   sodium chloride 1 g tablet     TAKE these medications   aspirin 81 MG EC tablet Take 1 tablet (81 mg total) by mouth 2 (two) times daily. Hold for platelets less than 100,000 What changed:  additional instructions   atorvastatin 20 MG tablet Commonly known as:  LIPITOR TAKE 1 TABLET (20 MG TOTAL) BY MOUTH DAILY. What changed:  See the new instructions.   calcium-vitamin D 500-200 MG-UNIT tablet Take 1 tablet by mouth daily.   carvedilol 6.25 MG tablet Commonly known as:  COREG Take 1 tablet (6.25 mg total) by mouth 2 (two) times daily with a meal.   docusate sodium 100 MG capsule Commonly known as:  COLACE Take 1 capsule (100 mg total) by mouth 2 (two) times daily.   HYDROcodone-acetaminophen 5-325 MG tablet Commonly known as:  NORCO/VICODIN Take 1 tablet by mouth every 6 (six) hours as needed (for pain).   LACTOBACILLUS PO Take 2 capsules by mouth 3 (three) times daily.   methocarbamol 500 MG tablet Commonly known as:  ROBAXIN Take 1 tablet (500 mg total) by mouth every 6 (six) hours as needed for muscle spasms.   OVER THE COUNTER MEDICATION Aquacel Dressing: "Maintain to right shoulder until ortho follow-up"   PRESERVISION AREDS PO Take 1 tablet by mouth daily.   PROMACTA 50 MG tablet Generic drug:  eltrombopag TAKE 1 TABLET (50 MG TOTAL) BY MOUTH DAILY AT 6 AM. TAKE ON AN EMPTY STOMACH 1 HR BEFORE OR 2 HRS AFTER FOOD, SEPARATE FROM VITAMINS What changed:  See the new instructions.   sertraline 25 MG tablet Commonly known as:  ZOLOFT Take 25 mg by mouth daily.   traZODone 50 MG tablet Commonly known as:  DESYREL Take 50-100 mg by mouth at bedtime as needed for sleep (for 14 days- until 06/15/18).   Vitamin D 1000 units capsule Take 1,000 Units by mouth at bedtime.      Follow-up Information    Merrilee Seashore, MD. Schedule an appointment as soon as possible for a visit in 1 week(s).   Specialty:  Internal Medicine Contact  information: [place] [address] [phone]          Allergies  Allergen Reactions  . Ace Inhibitors Cough    Consultations:  None   Procedures/Studies: Dg Chest 2 View  Result Date: 05/14/2018 CLINICAL DATA:  Fall, shoulder pain EXAM: CHEST - 2 VIEW COMPARISON:  05/12/2015 FINDINGS: Prior CABG. Mild cardiomegaly. Lungs clear. No effusions or pneumothorax. Severely displaced right humeral neck fracture noted as seen on today's right shoulder series. IMPRESSION: No acute cardiopulmonary disease. Displaced right humeral neck fracture. Electronically Signed   By: Rolm Baptise M.D.   On: 05/14/2018 20:22   Dg Shoulder Right  Result Date: 05/28/2018 CLINICAL DATA:  Repair of right shoulder fracture FLUOROSCOPY TIME:  13 seconds. Images: 2 EXAM: RIGHT SHOULDER - 2+ VIEW; DG C-ARM 61-120 MIN COMPARISON:  None. FINDINGS: A plate has been affixed to the proximal humerus, crossing the fracture site. There are multiple affixing screws. IMPRESSION: Fracture repair as above. Electronically Signed  By: Dorise Bullion III M.D   On: 05/28/2018 20:22   Dg Shoulder Right  Result Date: 05/14/2018 CLINICAL DATA:  Fall, shoulder pain EXAM: RIGHT SHOULDER - 2+ VIEW COMPARISON:  None. FINDINGS: There Is a right humeral neck fracture. The humeral shaft is displaced medially relative to the humeral head which appears located in the glenohumeral fossa. Degenerative changes in the right AC and glenohumeral joints. IMPRESSION: Severely displaced right humeral neck fracture. Electronically Signed   By: Rolm Baptise M.D.   On: 05/14/2018 20:21   Ct Head Wo Contrast  Result Date: 05/14/2018 CLINICAL DATA:  84 year old female with fall, head and neck injury. EXAM: CT HEAD WITHOUT CONTRAST CT CERVICAL SPINE WITHOUT CONTRAST TECHNIQUE: Multidetector CT imaging of the head and cervical spine was performed following the standard protocol without intravenous contrast. Multiplanar  CT image reconstructions of the cervical spine were also generated. COMPARISON:  08/22/2015 CT and prior studies FINDINGS: CT HEAD FINDINGS Brain: No acute infarction, hemorrhage, hydrocephalus or significant change noted from the prior study. A large chronic calcified LEFT frontoparietal subdural collection is again identified measuring up to 2.6 cm in maximal diameter causing 6 mm LEFT to RIGHT midline shift-unchanged. Atrophy and chronic small-vessel white matter ischemic changes are again noted. Vascular: Atherosclerotic calcifications again noted. Skull: Normal. Negative for fracture or focal lesion. Sinuses/Orbits: No acute finding. Other: None. CT CERVICAL SPINE FINDINGS Alignment: Normal. Skull base and vertebrae: No acute fracture. No primary bone lesion or focal pathologic process. Soft tissues and spinal canal: No prevertebral fluid or swelling. No visible canal hematoma. Disc levels: Moderate multilevel degenerative disc disease and spondylosis again noted. Mild multilevel facet arthropathy again identified. Upper chest: No acute abnormality Other: None IMPRESSION: 1. No evidence of acute intracranial abnormality. 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 2. No static evidence of acute injury to the cervical spine. Moderate multilevel degenerative changes 3. Cerebral atrophy and chronic small-vessel white matter ischemic changes. Electronically Signed   By: Margarette Canada M.D.   On: 05/14/2018 20:03   Ct Cervical Spine Wo Contrast  Result Date: 05/14/2018 CLINICAL DATA:  84 year old female with fall, head and neck injury. EXAM: CT HEAD WITHOUT CONTRAST CT CERVICAL SPINE WITHOUT CONTRAST TECHNIQUE: Multidetector CT imaging of the head and cervical spine was performed following the standard protocol without intravenous contrast. Multiplanar CT image reconstructions of the cervical spine were also generated. COMPARISON:   08/22/2015 CT and prior studies FINDINGS: CT HEAD FINDINGS Brain: No acute infarction, hemorrhage, hydrocephalus or significant change noted from the prior study. A large chronic calcified LEFT frontoparietal subdural collection is again identified measuring up to 2.6 cm in maximal diameter causing 6 mm LEFT to RIGHT midline shift-unchanged. Atrophy and chronic small-vessel white matter ischemic changes are again noted. Vascular: Atherosclerotic calcifications again noted. Skull: Normal. Negative for fracture or focal lesion. Sinuses/Orbits: No acute finding. Other: None. CT CERVICAL SPINE FINDINGS Alignment: Normal. Skull base and vertebrae: No acute fracture. No primary bone lesion or focal pathologic process. Soft tissues and spinal canal: No prevertebral fluid or swelling. No visible canal hematoma. Disc levels: Moderate multilevel degenerative disc disease and spondylosis again noted. Mild multilevel facet arthropathy again identified. Upper chest: No acute abnormality Other: None IMPRESSION: 1. No evidence of acute intracranial abnormality. 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 2. No static evidence of acute injury to the cervical spine.  Moderate multilevel degenerative changes 3. Cerebral atrophy and chronic small-vessel white matter ischemic changes. Electronically Signed   By: Margarette Canada M.D.   On: 05/14/2018 20:03   Ct Shoulder Right Wo Contrast  Result Date: 05/14/2018 CLINICAL DATA:  [place].  Right shoulder fracture. EXAM: CT OF THE UPPER RIGHT EXTREMITY WITHOUT CONTRAST TECHNIQUE: Multidetector CT imaging of the upper right extremity was performed according to the standard protocol. COMPARISON:  Radiographs 05/14/2018 FINDINGS: There is a transverse fracture through the humeral neck with medial displacement of the humeral shaft. The humeral shaft is displaced approximately 3 cm and is adjacent to the humeral head  and lower aspect of the glenoid. The humeral head is articulating with the glenoid. No vertical fracture through the greater tuberosity. No glenoid fracture. The [facility] joint is intact. The visualized right ribs are intact and the visualized right lung is clear. Atherosclerotic calcifications involving the ascending aorta and surgical changes from bypass surgery. IMPRESSION: 1. Displaced transverse fracture through the humeral neck as detailed above. Please see 3D images. 2. The other bony structures are intact. Electronically Signed   By: Marijo Sanes M.D.   On: 05/14/2018 21:57   Dg Shoulder Right Port  Result Date: 05/28/2018 CLINICAL DATA:  Postop films EXAM: PORTABLE RIGHT SHOULDER COMPARISON:  None. FINDINGS: Interval repair of right shoulder fracture. Bony fragments are seen medial to the proximal humerus. No other acute abnormalities. IMPRESSION: Fracture repair with remaining bony fragments as above. Electronically Signed   By: Dorise Bullion III M.D   On: 05/28/2018 21:23   Dg Humerus Right  Result Date: 05/15/2018 CLINICAL DATA:  Postreduction EXAM: RIGHT HUMERUS - 2+ VIEW COMPARISON:  05/14/2018 FINDINGS: Interval reduction of the displaced humeral neck fracture with improved alignment on this single AP view. No subluxation or dislocation. IMPRESSION: Improved alignment postreduction on this single view. Electronically Signed   By: Rolm Baptise M.D.   On: 05/15/2018 00:58   Dg C-arm 1-60 Min  Result Date: 05/28/2018 CLINICAL DATA:  Repair of right shoulder fracture FLUOROSCOPY TIME:  13 seconds. Images: 2 EXAM: RIGHT SHOULDER - 2+ VIEW; DG C-ARM 61-120 MIN COMPARISON:  None. FINDINGS: A plate has been affixed to the proximal humerus, crossing the fracture site. There are multiple affixing screws. IMPRESSION: Fracture repair as above. Electronically Signed   By: Dorise Bullion III M.D   On: 05/28/2018 20:22   Dg C-arm 1-60 Min  Result Date: 05/28/2018 CLINICAL DATA:  Repair of right  shoulder fracture FLUOROSCOPY TIME:  13 seconds. Images: 2 EXAM: RIGHT SHOULDER - 2+ VIEW; DG C-ARM 61-120 MIN COMPARISON:  None. FINDINGS: A plate has been affixed to the proximal humerus, crossing the fracture site. There are multiple affixing screws. IMPRESSION: Fracture repair as above. Electronically Signed   By: Dorise Bullion III M.D   On: 05/28/2018 20:22   Xr Shoulder Right  Result Date: 05/27/2018 2 views right proximal humerus reviewed.  Significantly more displacement is present in the 2 part proximal humerus fracture.  Shaft is displaced medially.  The humeral head remains located in the socket    Subjective: No concerns overnight.  Discharge Exam: Vitals:   06/09/18 0438 06/09/18 0847  BP: (!) 178/66 (!) 162/71  Pulse: 73 72  Resp: 18 18  Temp: 98.6 F (37 C) 97.8 F (36.6 C)  SpO2: 92% 93%   Vitals:   06/08/18 1715 06/08/18 2058 06/09/18 0438 06/09/18 0847  BP: (!) 169/97 (!) 160/65 (!) 178/66 (!) 162/71  Pulse: 71 68  73 72  Resp: 18 16 18 18   Temp: 98.6 F (37 C) 98.4 F (36.9 C) 98.6 F (37 C) 97.8 F (36.6 C)  TempSrc: Oral Oral Oral Oral  SpO2: 96% 95% 92% 93%  Weight:      Height:        General: Pt is alert, awake, not in acute distress Cardiovascular: RRR, S1/S2 +, no rubs, no gallops Respiratory: CTA bilaterally, no wheezing, no rhonchi Abdominal: Soft, NT, ND, bowel sounds + Extremities: no edema, no cyanosis    The results of significant diagnostics from this hospitalization (including imaging, microbiology, ancillary and laboratory) are listed below for reference.     Microbiology: No results found for this or any previous visit (from the past 240 hour(s)).   Labs: BNP (last 3 results) No results for input(s): BNP in the last 8760 hours. Basic Metabolic Panel: Recent Labs  Lab 06/05/18 1616 06/07/18 0404  NA 132* 134*  K 3.8 3.7  CL 97* 101  CO2 27 24  GLUCOSE 119* 112*  BUN 14 14  CREATININE 1.00 1.20*  CALCIUM 8.8* 8.4*    Liver Function Tests: No results for input(s): AST, ALT, ALKPHOS, BILITOT, PROT, ALBUMIN in the last 168 hours. No results for input(s): LIPASE, AMYLASE in the last 168 hours. No results for input(s): AMMONIA in the last 168 hours. CBC: Recent Labs  Lab 06/05/18 1616 06/06/18 0539 06/07/18 0404 06/09/18 0416  WBC 6.0 5.2 6.9 6.0  HGB 7.2* 10.2* 11.2* 11.2*  HCT 22.9* 31.6* 35.1* 33.9*  MCV 111.2* 97.5 98.3 98.0  PLT 93* 65* 61* 57*  57*   Cardiac Enzymes: No results for input(s): CKTOTAL, CKMB, CKMBINDEX, TROPONINI in the last 168 hours. BNP: Invalid input(s): POCBNP CBG: No results for input(s): GLUCAP in the last 168 hours. D-Dimer Recent Labs    06/09/18 0416  DDIMER 4.71*   Hgb A1c No results for input(s): HGBA1C in the last 72 hours. Lipid Profile No results for input(s): CHOL, HDL, LDLCALC, TRIG, CHOLHDL, LDLDIRECT in the last 72 hours. Thyroid function studies No results for input(s): TSH, T4TOTAL, T3FREE, THYROIDAB in the last 72 hours.  Invalid input(s): FREET3 Anemia work up Recent Labs    06/09/18 0416  RETICCTPCT 5.9*   Urinalysis    Component Value Date/Time   COLORURINE YELLOW 05/20/2018 [place] 05/20/2018 1418   APPEARANCEUR Hazy 11/19/2012 1750   LABSPEC 1.011 05/20/2018 1418   LABSPEC 1.014 11/19/2012 1750   PHURINE 5.0 05/20/2018 1418   GLUCOSEU NEGATIVE 05/20/2018 1418   GLUCOSEU Negative 11/19/2012 1750   HGBUR SMALL (A) 05/20/2018 1418   BILIRUBINUR NEGATIVE 05/20/2018 1418   BILIRUBINUR negative 05/12/2015 1823   BILIRUBINUR Negative 11/19/2012 [place] 05/20/2018 [place] 05/20/2018 1418   UROBILINOGEN 0.2 05/13/2015 0504   NITRITE NEGATIVE 05/20/2018 1418   LEUKOCYTESUR MODERATE (A) 05/20/2018 1418   LEUKOCYTESUR 2+ 11/19/2012 1750   Sepsis Labs Invalid input(s): PROCALCITONIN,  WBC,  LACTICIDVEN Microbiology No results found for this or any previous visit (from the  past 240 hour(s)).  SIGNED:   Cordelia Poche, MD [facility] 06/09/2018, 12:11 PM

## 2018-06-07 NOTE — Evaluation (Signed)
Physical Therapy Evaluation Patient Details Name: Lisa James MRN: [id number] DOB: 1927/08/31 Today's Date: 06/07/2018   History of Present Illness  Lisa James is a 84 y.o. female with medical history significant for dementia, coronary artery disease, hypertension, non-Hodgkin's lymphoma who lives in a skilled nursing facility who sustained a right humerus fracture on October 24 after falling and had an ORIF on Nov 7. Admitted again on 11/15 with acute on chronic anemia.  Clinical Impression  Patient required min a for bed mobility, min a to stand, and mod a to transfer to a chair. She came from rehabilitation. She would benefit from a return to rehab when appropriate. Acute therapy will continue to work with the patient.     Follow Up Recommendations SNF    Equipment Recommendations       Recommendations for Other Services       Precautions / Restrictions Precautions Precautions: Fall;Shoulder Type of Shoulder Precautions: conservative Shoulder Interventions: Shoulder sling/immobilizer;At all times Precaution Booklet Issued: Yes (comment) Precaution Comments: sling off for showering only Required Braces or Orthoses: Sling Restrictions Weight Bearing Restrictions: Yes RUE Weight Bearing: Non weight bearing      Mobility  Bed Mobility Overal bed mobility: Needs Assistance Bed Mobility: Supine to Sit;Sit to Supine     Supine to sit: Min assist     General bed mobility comments: Patient required min a to scoot to the edge of the bed.   Transfers Overall transfer level: Needs assistance Equipment used: 1 person hand held assist Transfers: Sit to/from Stand Sit to Stand: Min assist Stand pivot transfers: Mod assist       General transfer comment: Min a to stand. Patient stood with PT x4. She required mod a for guidance to the chair after using the commode.   Ambulation/Gait                Stairs            Wheelchair Mobility     Modified Rankin (Stroke Patients Only)       Balance Overall balance assessment: Needs assistance Sitting-balance support: No upper extremity supported;Feet supported Sitting balance-Leahy Scale: Fair     Standing balance support: Single extremity supported Standing balance-Leahy Scale: Poor Standing balance comment: Reliant on LUE and external support and A from therapist                             Pertinent Vitals/Pain Pain Assessment: No/denies pain Faces Pain Scale: Hurts little more Pain Location: bottom from trying to have bowel movement Pain Descriptors / Indicators: Sore    Home Living Family/patient expects to be discharged to:: Skilled nursing facility                 Additional Comments: Patient plans on going back to SNF     Prior Function Level of [place]: Needs assistance   Gait / Transfers Assistance Needed: Has been working on gait at skilled nursing   ADL's / Homemaking Assistance Needed: requiring assist since recent hospitalization        Hand Dominance        Extremity/Trunk Assessment   Upper Extremity Assessment Upper Extremity Assessment: Defer to OT evaluation    Lower Extremity Assessment Lower Extremity Assessment: Overall WFL for tasks assessed    Cervical / Trunk Assessment Cervical / Trunk Assessment: Kyphotic  Communication      Cognition Arousal/Alertness: Awake/alert Behavior During Therapy: Anxious Overall  Cognitive Status: History of cognitive impairments - at baseline                                 General Comments: Dementia at baseline - very pleasant; Able to follow 1 step commands consistently      General Comments      Exercises     Assessment/Plan    PT Assessment Patient needs continued PT services  PT Problem List Decreased cognition;Decreased knowledge of use of DME;Decreased safety awareness;Decreased knowledge of precautions;Decreased strength;Decreased  balance;Decreased range of motion;Decreased mobility       PT Treatment Interventions DME instruction;Gait training;Functional mobility training;Therapeutic activities;Therapeutic exercise;Balance training;Patient/family education;Cognitive remediation    PT Goals (Current goals can be found in the Care Plan section)  Acute Rehab PT Goals PT Goal Formulation: Patient unable to participate in goal setting    Frequency Min 2X/week   Barriers to discharge        Co-evaluation               AM-PAC PT "6 Clicks" Daily Activity  Outcome Measure Difficulty turning over in bed (including adjusting bedclothes, sheets and blankets)?: Unable Difficulty moving from lying on back to sitting on the side of the bed? : Unable Difficulty sitting down on and standing up from a chair with arms (e.g., wheelchair, bedside commode, etc,.)?: Unable Help needed moving to and from a bed to chair (including a wheelchair)?: A Lot Help needed walking in hospital room?: A Lot Help needed climbing 3-5 steps with a railing? : Total 6 Click Score: 8    End of Session Equipment Utilized During Treatment: Gait belt;Other (comment) Activity Tolerance: Patient tolerated treatment well Patient left: in chair;with call bell/phone within reach;with family/visitor present;with chair alarm set Nurse Communication: Mobility status;Other (comment) PT Visit Diagnosis: Unsteadiness on feet (R26.81);Muscle weakness (generalized) (M62.81);History of falling (Z91.81);Pain Pain - Right/Left: Right Pain - part of body: Arm    Time: 1450-1520 PT Time Calculation (min) (ACUTE ONLY): 30 min   Charges:   PT Evaluation $PT Eval Moderate Complexity: 1 Mod            Carney Living 06/07/2018, 4:17 PM

## 2018-06-07 NOTE — Progress Notes (Signed)
PROGRESS NOTE    Lisa James  OMV:[id number] DOB: 1928-03-17 DOA: 06/05/2018 PCP: Merrilee Seashore, MD   Brief Narrative: Lisa James is a 84 y.o. female with medical history significant for dementia, ITP, coronary artery disease, hypertension, non-Hodgkin's lymphoma. Patient presents secondary to an abnormal lab of anemia. Given 2 units of PRBC and platelets dropped.   Assessment & Plan:   Principal Problem:   Acute on chronic anemia Active Problems:   HYPERTENSION, BENIGN   Non-Hodgkin's lymphoma (HCC)   CAD (coronary artery disease)   Chronic kidney disease (CKD), stage III (moderate) (HCC)   GERD (gastroesophageal reflux disease)   Immune thrombocytopenic purpura (HCC)   Hyponatremia   Chronic diastolic CHF (congestive heart failure) (HCC)   Acute on chronic anemia Patient with a history of ITP on Promacta and recent surgery with some blood loss. Platelets down from yesterday. S/p 2 units PRBC. Hemoglobin stable today.  ITP Platelets trending down. No obvious bleeding currently. Hemoglobin stable.  Right humerus fracture S/p ORIF on 11/7. -Continue sling  Essential hypertension -Continue Coreg  Chronic diastolic heart failure Stable. Last Transthoracic Echocardiogram significant for an EF of 60-65% and grade 2 diastolic dysfunction  CKD stage III Stable  Hyperlipidemia CAD -Continue Lipitor   Pressure Injury Documentation: Pressure Injury 05/15/18 Stage II -  Partial thickness loss of dermis presenting as a shallow open ulcer with a red, pink wound bed without slough. fissure slightly opened and red (Active)  05/15/18 0145   Location: Sacrum  Location Orientation: Medial  Staging: Stage II -  Partial thickness loss of dermis presenting as a shallow open ulcer with a red, pink wound bed without slough.  Wound Description (Comments): fissure slightly opened and red  Present on Admission: Yes     DVT prophylaxis: SCDs Code Status:   Code  Status: Full Code Family Communication: Niece on telephone Disposition Plan: Discharge to SNF likely in 24 hours   Consultants:   None  Procedures:   PRBC (2 units)  Antimicrobials:  None    Subjective: No issues overnight  Objective: Vitals:   06/06/18 1645 06/06/18 2055 06/07/18 0501 06/07/18 0904  BP: (!) 158/93 (!) 147/71 (!) 167/75 (!) 156/79  Pulse: 75 73 82 73  Resp: 20 16 16 19   Temp: 98.7 F (37.1 C) 98.3 F (36.8 C) 98.5 F (36.9 C) 98.4 F (36.9 C)  TempSrc: Oral Oral  Oral  SpO2: 100% 100% 95% 94%  Weight:  55.6 kg    Height:        Intake/Output Summary (Last 24 hours) at 06/07/2018 1455 Last data filed at 06/07/2018 0900 Gross per 24 hour  Intake 340 ml  Output 0 ml  Net 340 ml   Filed Weights   06/05/18 1548 06/05/18 2057 06/06/18 2055  Weight: 53.1 kg 55.7 kg 55.6 kg    Examination:  General exam: Appears calm and comfortable Respiratory system: Clear to auscultation. Respiratory effort normal. Cardiovascular system: S1 & S2 heard, RRR. No murmurs, rubs, gallops or clicks. Gastrointestinal system: Abdomen is nondistended, soft and nontender. No organomegaly or masses felt. Normal bowel sounds heard. Central nervous system: Alert. No focal neurological deficits. Extremities: No calf tenderness Skin: No cyanosis. No rashes Psychiatry: Judgement and insight appear normal. Mood & affect appropriate.     Data Reviewed: I have personally reviewed following labs and imaging studies  CBC: Recent Labs  Lab 06/05/18 1616 06/06/18 0539 06/07/18 0404  WBC 6.0 5.2 6.9  HGB 7.2* 10.2* 11.2*  HCT 22.9* 31.6* 35.1*  MCV 111.2* 97.5 98.3  PLT 93* 65* 61*   Basic Metabolic Panel: Recent Labs  Lab 06/05/18 1616 06/07/18 0404  NA 132* 134*  K 3.8 3.7  CL 97* 101  CO2 27 24  GLUCOSE 119* 112*  BUN 14 14  CREATININE 1.00 1.20*  CALCIUM 8.8* 8.4*   GFR: Estimated Creatinine Clearance: 23.9 mL/min (A) (by C-G formula based on SCr of  1.2 mg/dL (H)). Liver Function Tests: No results for input(s): AST, ALT, ALKPHOS, BILITOT, PROT, ALBUMIN in the last 168 hours. No results for input(s): LIPASE, AMYLASE in the last 168 hours. No results for input(s): AMMONIA in the last 168 hours. Coagulation Profile: No results for input(s): INR, PROTIME in the last 168 hours. Cardiac Enzymes: No results for input(s): CKTOTAL, CKMB, CKMBINDEX, TROPONINI in the last 168 hours. BNP (last 3 results) No results for input(s): PROBNP in the last 8760 hours. HbA1C: No results for input(s): HGBA1C in the last 72 hours. CBG: No results for input(s): GLUCAP in the last 168 hours. Lipid Profile: No results for input(s): CHOL, HDL, LDLCALC, TRIG, CHOLHDL, LDLDIRECT in the last 72 hours. Thyroid Function Tests: No results for input(s): TSH, T4TOTAL, FREET4, T3FREE, THYROIDAB in the last 72 hours. Anemia Panel: No results for input(s): VITAMINB12, FOLATE, FERRITIN, TIBC, IRON, RETICCTPCT in the last 72 hours. Sepsis Labs: No results for input(s): PROCALCITON, LATICACIDVEN in the last 168 hours.  Recent Results (from the past 240 hour(s))  MRSA PCR Screening     Status: None   Collection Time: 05/29/18  6:18 AM  Result Value Ref Range Status   MRSA by PCR NEGATIVE NEGATIVE Final    Comment:        The GeneXpert MRSA Assay (FDA approved for NASAL specimens only), is one component of a comprehensive MRSA colonization surveillance program. It is not intended to diagnose MRSA infection nor to guide or monitor treatment for MRSA infections. Performed at [facility], [place] [address] [place], [place] [address]          Radiology Studies: No results found.      Scheduled Meds: . atorvastatin  20 mg Oral QPM  . calcium-vitamin D  1 tablet Oral Q breakfast  . carvedilol  6.25 mg Oral BID WC  . cholecalciferol  1,000 Units Oral QHS  . eltrombopag  50 mg Oral Daily  . multivitamin  1 tablet Oral Daily  . sertraline  25 mg  Oral Daily  . sodium chloride flush  3 mL Intravenous Q12H   Continuous Infusions: . sodium chloride       LOS: 0 days     Cordelia Poche, MD [facility] 06/07/2018, 2:55 PM  If 7PM-7AM, please contact night-coverage [email or web address]

## 2018-06-08 DIAGNOSIS — I5032 Chronic diastolic (congestive) heart failure: Secondary | ICD-10-CM | POA: Diagnosis not present

## 2018-06-08 DIAGNOSIS — F0390 Unspecified dementia without behavioral disturbance: Secondary | ICD-10-CM

## 2018-06-08 DIAGNOSIS — D649 Anemia, unspecified: Secondary | ICD-10-CM | POA: Diagnosis not present

## 2018-06-08 DIAGNOSIS — N183 Chronic kidney disease, stage 3 (moderate): Secondary | ICD-10-CM | POA: Diagnosis not present

## 2018-06-08 DIAGNOSIS — D693 Immune thrombocytopenic purpura: Secondary | ICD-10-CM | POA: Diagnosis not present

## 2018-06-08 DIAGNOSIS — W19XXXD Unspecified fall, subsequent encounter: Secondary | ICD-10-CM

## 2018-06-08 DIAGNOSIS — I10 Essential (primary) hypertension: Secondary | ICD-10-CM | POA: Diagnosis not present

## 2018-06-08 DIAGNOSIS — S42201D Unspecified fracture of upper end of right humerus, subsequent encounter for fracture with routine healing: Secondary | ICD-10-CM

## 2018-06-08 MED ORDER — SALINE SPRAY 0.65 % NA SOLN
1.0000 | NASAL | Status: DC | PRN
  Administered 2018-06-08: 1 via NASAL
  Filled 2018-06-08: qty 44

## 2018-06-08 NOTE — Progress Notes (Signed)
ARABELA BASALDUA   DOB:Sep 07, 1927   HK#:[id number]   VFI#:[id number]  Hematology follow-up note  Subjective: Patient is well-known to me, under my care for her ITP.  She was recently hospitalized for right proximal humerus fracture on May 28, 2018, status post open reduction and internal fixation. She was admitted for severe anemia, s/p 2u blood transfusion. Plt has been trending down after transfusion. She was pleasant, eating dinner with assistance. Due to her dementia, she does not remember much, but denies any pain or bleeding.    Objective:  Vitals:   06/08/18 1715 06/08/18 2058  BP: (!) 169/97 (!) 160/65  Pulse: 71 68  Resp: 18 16  Temp: 98.6 F (37 C) 98.4 F (36.9 C)  SpO2: 96% 95%    Body mass index is 25.62 kg/m.  Intake/Output Summary (Last 24 hours) at 06/08/2018 2204 Last data filed at 06/08/2018 1900 Gross per 24 hour  Intake 460 ml  Output 0 ml  Net 460 ml     Sclerae unicteric  (+) several small ecchymosis on her lower extremities   No peripheral adenopathy  Lungs clear -- no rales or rhonchi  Heart regular rate and rhythm  Abdomen benign  MSK no focal spinal tenderness, no peripheral edema  Neuro nonfocal    CBG (last 3)  No results for input(s): GLUCAP in the last 72 hours.   Labs:  Lab Results  Component Value Date   WBC 6.9 06/07/2018   HGB 11.2 (L) 06/07/2018   HCT 35.1 (L) 06/07/2018   MCV 98.3 06/07/2018   PLT 61 (L) 06/07/2018   NEUTROABS 11.1 (H) 05/21/2018   CMP Latest Ref Rng & Units 06/07/2018 06/05/2018 05/29/2018  Glucose 70 - 99 mg/dL 112(H) 119(H) 86  BUN 8 - 23 mg/dL '14 14 18  ' Creatinine 0.44 - 1.00 mg/dL 1.20(H) 1.00 1.12(H)  Sodium 135 - 145 mmol/L 134(L) 132(L) 128(L)  Potassium 3.5 - 5.1 mmol/L 3.7 3.8 4.9  Chloride 98 - 111 mmol/L 101 97(L) 97(L)  CO2 22 - 32 mmol/L '24 27 23  ' Calcium 8.9 - 10.3 mg/dL 8.4(L) 8.8(L) 8.0(L)  Total Protein 6.5 - 8.1 g/dL - - -  Total Bilirubin 0.3 - 1.2 mg/dL - - -  Alkaline Phos 38 -  126 U/L - - -  AST 15 - 41 U/L - - -  ALT 0 - 44 U/L - - -     Urine Studies No results for input(s): UHGB, CRYS in the last 72 hours.  Invalid input(s): UACOL, UAPR, USPG, UPH, UTP, UGL, UKET, UBIL, UNIT, UROB, ULEU, UEPI, UWBC, URBC, UBAC, CAST, UCOM, BILUA  Basic Metabolic Panel: Recent Labs  Lab 06/05/18 1616 06/07/18 0404  NA 132* 134*  K 3.8 3.7  CL 97* 101  CO2 27 24  GLUCOSE 119* 112*  BUN 14 14  CREATININE 1.00 1.20*  CALCIUM 8.8* 8.4*   GFR Estimated Creatinine Clearance: 23.9 mL/min (A) (by C-G formula based on SCr of 1.2 mg/dL (H)). Liver Function Tests: No results for input(s): AST, ALT, ALKPHOS, BILITOT, PROT, ALBUMIN in the last 168 hours. No results for input(s): LIPASE, AMYLASE in the last 168 hours. No results for input(s): AMMONIA in the last 168 hours. Coagulation profile No results for input(s): INR, PROTIME in the last 168 hours.  CBC: Recent Labs  Lab 06/05/18 1616 06/06/18 0539 06/07/18 0404  WBC 6.0 5.2 6.9  HGB 7.2* 10.2* 11.2*  HCT 22.9* 31.6* 35.1*  MCV 111.2* 97.5 98.3  PLT 93* 65*  61*   Cardiac Enzymes: No results for input(s): CKTOTAL, CKMB, CKMBINDEX, TROPONINI in the last 168 hours. BNP: Invalid input(s): POCBNP CBG: No results for input(s): GLUCAP in the last 168 hours. D-Dimer No results for input(s): DDIMER in the last 72 hours. Hgb A1c No results for input(s): HGBA1C in the last 72 hours. Lipid Profile No results for input(s): CHOL, HDL, LDLCALC, TRIG, CHOLHDL, LDLDIRECT in the last 72 hours. Thyroid function studies No results for input(s): TSH, T4TOTAL, T3FREE, THYROIDAB in the last 72 hours.  Invalid input(s): FREET3 Anemia work up No results for input(s): VITAMINB12, FOLATE, FERRITIN, TIBC, IRON, RETICCTPCT in the last 72 hours. Microbiology No results found for this or any previous visit (from the past 240 hour(s)).    Studies:  No results found.  Assessment: 84 y.o. female, with history of dementia,  ITP, coronary artery disease, hypertension, presented with moderate anemia, and worsening thrombocytopenia.  1. Moderate anemia, possible related to her recent surgery, no other evidence of bleeding  2. Chronic ITP, on promacta, plt around 100K, worse after blood transfusion 3. Right proximal humerus fracture on May 28, 2018, status post open reduction and internal fixation. 4. HTN  Plan:  -lab reviewed. I think her anemia is probably related to her recent surgery, she responded very well to blood transfusion, hemoglobin 11.2 today -Her platelet count has slightly trending down since her admission, likely still related to her ITP fluctuation - will check DIC, ret count, and LDH, and haptoglobin to rule out hemolysis, if unremarkable, OK to discharge from my standpoint, and monitor lab weekly at her rehab -I will see her back in 2 weeks in my clinic for f/u    Truitt Merle, MD 06/08/2018

## 2018-06-08 NOTE — NC FL2 (Signed)
[facility] LEVEL OF CARE SCREENING TOOL     IDENTIFICATION  Patient Name: Lisa James Birthdate: Jul 24, 1927 Sex: female Admission Date (Current Location): 06/05/2018  [facility] and [place] Number:  Herbalist and Address:  [facility] [place], [place] [address], [place], [place] [address]      Provider Number: [id number]  Attending Physician Name and Address:  Mariel Aloe, MD  Relative Name and Phone Number:  Laqueta Jean - niece; [phone] (mobile)    Current Level of Care: Hospital Recommended Level of Care: [place] Prior Approval Number:    Date Approved/Denied:   PASRR Number: [id number]  Discharge Plan: SNF(Patient came to hospital from [place])    Current Diagnoses: Patient Active Problem List   Diagnosis Date Noted  . Acute on chronic anemia 06/05/2018  . Chronic diastolic CHF (congestive heart failure) ([place]) 06/05/2018  . Closed fracture of right proximal humerus 05/30/2018  . Proximal humerus fracture 05/28/2018  . Pressure injury of skin 05/15/2018  . Fall 05/14/2018  . Closed comminuted right humeral fracture 05/14/2018  . UTI (urinary tract infection) 05/14/2018  . Hyponatremia 05/14/2018  . Leucocytosis 05/14/2018  . Altered mental status 05/13/2015  . Idiopathic thrombocytopenic purpura ([place]) 07/06/2013  . Aortic valve stenosis 06/19/2013  . Immune thrombocytopenic purpura ([place]) 01/21/2013  . GERD (gastroesophageal reflux disease) 12/17/2012  . Type II or unspecified type diabetes mellitus with renal manifestations, not stated as uncontrolled(250.40) 12/10/2012  . Chronic kidney disease (CKD), stage III (moderate) ([place]) 11/22/2012  . Diabetes mellitus ([place]) 07/03/2011  . Depression 07/03/2011  . Non-Hodgkin's lymphoma ([place]) 07/03/2011  . CAD (coronary artery disease) 07/03/2011  . PAD (peripheral artery disease) ([place]) 07/03/2011  . Vitamin D deficiency 07/03/2011  .  Subdural hematoma ([place]) 07/03/2011  . Confusion 05/12/2011  . Subdural hematoma, acute ([place]) 05/12/2011  . HYPERLIPIDEMIA TYPE IIB / III 05/10/2009  . OLD MYOCARDIAL INFARCTION 05/10/2009  . Occlusion and stenosis of carotid artery without mention of cerebral infarction 05/10/2009  . HYPERTENSION, BENIGN 11/03/2008  . MITRAL REGURGITATION 10/29/2008  . ISCHEMIC HEART DISEASE 10/29/2008  . SYSTOLIC HEART FAILURE, CHRONIC 10/29/2008    Orientation RESPIRATION BLADDER Height & Weight     Self, Place  Normal Incontinent Weight: 122 lb 9.2 oz (55.6 kg) Height:  4\' 10"  (147.3 cm)  BEHAVIORAL SYMPTOMS/MOOD NEUROLOGICAL BOWEL NUTRITION STATUS      Incontinent Diet(Regular)  AMBULATORY STATUS COMMUNICATION OF NEEDS Skin   Total Care(Patient did not ambulate with PT during eval on 11/17)   Other (Comment)(Abrasion right chest; Ecchymosis right arm and chest; Fissure mid sacrum-with foam dressing; MASD to bilateral perineum treated with barrier cream; weeping left & right lower leg treated with cleansing)                       Personal Care Assistance Level of Assistance  Bathing, Feeding, Dressing Bathing Assistance: Maximum assistance Feeding assistance: Limited assistance(Min Assist) Dressing Assistance: Maximum assistance     Functional Limitations Info  Sight, Hearing, Speech Sight Info: Adequate Hearing Info: Adequate Speech Info: Adequate    SPECIAL CARE FACTORS FREQUENCY  PT (By licensed PT), OT (By licensed OT)     PT Frequency: PT Eval and Treat at Skilled nursing facility; Evaluated in acute inpatient setting 06/07/18 OT Frequency: OT Eval and Treat at Skilled nursing facility; Evaluated in acute inpatient setting 06/06/18            Contractures  Contractures Info: Not present    Additional Factors Info  Code Status Code Status Info: Full Allergies Info: Ace Inhibitors           Current Medications (06/08/2018):  This is the current hospital active  medication list Current Facility-Administered Medications  Medication Dose Route Frequency Provider Last Rate Last Dose  . 0.9 %  sodium chloride infusion  10 mL/hr Intravenous Once Janora Norlander, MD      . atorvastatin (LIPITOR) tablet 20 mg  20 mg Oral QPM Janora Norlander, MD   20 mg at 06/07/18 1702  . bisacodyl (DULCOLAX) suppository 10 mg  10 mg Rectal Daily PRN Mariel Aloe, MD      . calcium-vitamin D (OSCAL WITH D) 500-200 MG-UNIT per tablet 1 tablet  1 tablet Oral Q breakfast Janora Norlander, MD   1 tablet at 06/08/18 [phone]  . carvedilol (COREG) tablet 6.25 mg  6.25 mg Oral BID WC Janora Norlander, MD   6.25 mg at 06/08/18 0829  . cholecalciferol (VITAMIN D3) tablet 1,000 Units  1,000 Units Oral QHS Janora Norlander, MD   1,000 Units at 06/06/18 2206  . eltrombopag (PROMACTA) tablet 50 mg  50 mg Oral Daily Janora Norlander, MD      . HYDROcodone-acetaminophen (NORCO/VICODIN) 5-325 MG per tablet 1 tablet  1 tablet Oral Q6H PRN Janora Norlander, MD      . methocarbamol (ROBAXIN) tablet 500 mg  500 mg Oral Q6H PRN Janora Norlander, MD      . multivitamin (PROSIGHT) tablet 1 tablet  1 tablet Oral Daily Janora Norlander, MD   1 tablet at 06/08/18 [phone]  . sertraline (ZOLOFT) tablet 25 mg  25 mg Oral Daily Janora Norlander, MD   25 mg at 06/08/18 5009  . sodium chloride (OCEAN) 0.65 % nasal spray 1 spray  1 spray Each Nare PRN Bodenheimer, Charles A, NP   1 spray at 06/08/18 0829  . sodium chloride flush (NS) 0.9 % injection 3 mL  3 mL Intravenous Q12H Janora Norlander, MD   3 mL at 06/06/18 2206  . traZODone (DESYREL) tablet 50-100 mg  50-100 mg Oral QHS PRN Janora Norlander, MD   50 mg at 06/06/18 2206     Discharge Medications: Please see discharge summary for a list of discharge medications.  Relevant Imaging Results:  Relevant Lab Results:   Additional Information ss#[id number]  Shar[place] Mila Homer, LCSW

## 2018-06-08 NOTE — Clinical Social Work Note (Signed)
Clinical Social Work Assessment  Patient Details  Name: Lisa James MRN: [id number] Date of Birth: Nov 01, 1927  Date of referral:  06/06/18               Reason for consult:  Discharge Planning                Permission sought to share information with:  Family Supports Permission granted to share information::  No(Patient oriented to person and place)  Name::        Agency::     Relationship::     Contact Information:     Housing/Transportation Living arrangements for the past 2 months:  Skilled Nursing Facility(Patient from [facility] receiving ST rehab) Source of Information:  Other (Comment Required)(Niece Laqueta Jean) Patient Interpreter Needed:  None Criminal Activity/Legal Involvement Pertinent to Current Situation/Hospitalization:  No - Comment as needed Significant Relationships:  Other Family Members Lives with:  Facility Resident Do you feel safe going back to the place where you live?  Yes(Patient currently at [facility] and will return home after ST rehab) Need for family participation in patient care:  Yes (Comment)  Care giving concerns:  Niece expressed no concerns regarding patient's care at [facility].  Social Worker assessment / plan:  CSW talked with niece by phone and she confirmed that patient will return to [facility] and that she will provide transport. CSW also advised niece that patient will not discharge until insurance authorization received.  Employment status:  Retired Research officer, political party) PT Recommendations:  [place] / Referral to community resources:  Other (Comment Required)(None needed or requested as patient from a SNF)  Patient/Family's Response to care: No concerns expressed by niece regarding care during hospitalization.  Patient/Family's Understanding of and Emotional Response to Diagnosis, Current Treatment, and Prognosis:  Niece appears knowledgeable regarding patient's medical  issues and need for rehab before returning home.  Emotional Assessment Appearance:  Other (Comment Required(Did not visit patient in room, talked with niece by phone) Attitude/Demeanor/Rapport:  Unable to Assess Affect (typically observed):  Unable to Assess Orientation:  Oriented to Self, Oriented to Place Alcohol / Substance use:  Never Used Psych involvement (Current and /or in the community):  No (Comment)  Discharge Needs  Concerns to be addressed:  Discharge Planning Concerns Readmission within the last 30 days:  Yes Current discharge risk:  None Barriers to Discharge:  [place], Lisa Vari Bradley, LCSW 06/08/2018, 2:50 PM

## 2018-06-08 NOTE — Progress Notes (Signed)
PROGRESS NOTE    Lisa James  YBO:[id number] DOB: 12-15-1927 DOA: 06/05/2018 PCP: Merrilee Seashore, MD   Brief Narrative: Lisa James is a 84 y.o. female with medical history significant for dementia, ITP, coronary artery disease, hypertension, non-Hodgkin's lymphoma. Patient presents secondary to an abnormal lab of anemia. Given 2 units of PRBC and platelets dropped.   Assessment & Plan:   Principal Problem:   Acute on chronic anemia Active Problems:   HYPERTENSION, BENIGN   Non-Hodgkin's lymphoma (HCC)   CAD (coronary artery disease)   Chronic kidney disease (CKD), stage III (moderate) (HCC)   GERD (gastroesophageal reflux disease)   Immune thrombocytopenic purpura (HCC)   Hyponatremia   Chronic diastolic CHF (congestive heart failure) (HCC)   Acute on chronic anemia Patient with a history of ITP on Promacta and recent surgery with some blood loss. Platelets down from yesterday. S/p 2 units PRBC. Hemoglobin stable today.  ITP Platelets trending down. No obvious bleeding currently. Hemoglobin stable.  Right humerus fracture S/p ORIF on 11/7. -Continue sling  Essential hypertension -Continue Coreg  Chronic diastolic heart failure Stable. Last Transthoracic Echocardiogram significant for an EF of 60-65% and grade 2 diastolic dysfunction  CKD stage III Stable  Hyperlipidemia CAD -Continue Lipitor  Pressure Injury Medial sacrum, POA   DVT prophylaxis: SCDs Code Status:   Code Status: Full Code Family Communication: None at bedside Disposition Plan: Discharge to SNF when bed available   Consultants:   None  Procedures:   PRBC (2 units)  Antimicrobials:  None    Subjective: No issues overnight  Objective: Vitals:   06/07/18 1642 06/07/18 2035 06/08/18 0448 06/08/18 0904  BP: (!) 166/78 127/79 (!) 149/70 (!) 150/60  Pulse: 66 72 81 63  Resp: (!) 21 16 16 18   Temp: 98.7 F (37.1 C) 99 F (37.2 C) 98.1 F (36.7 C) 98.3 F  (36.8 C)  TempSrc: Oral Oral Oral Oral  SpO2: 96% 96% 95% 95%  Weight:      Height:        Intake/Output Summary (Last 24 hours) at 06/08/2018 1558 Last data filed at 06/08/2018 1200 Gross per 24 hour  Intake 700 ml  Output 0 ml  Net 700 ml   Filed Weights   06/05/18 1548 06/05/18 2057 06/06/18 2055  Weight: 53.1 kg 55.7 kg 55.6 kg    Examination:  General: Well appearing, no distress    Data Reviewed: I have personally reviewed following labs and imaging studies  CBC: Recent Labs  Lab 06/05/18 1616 06/06/18 0539 06/07/18 0404  WBC 6.0 5.2 6.9  HGB 7.2* 10.2* 11.2*  HCT 22.9* 31.6* 35.1*  MCV 111.2* 97.5 98.3  PLT 93* 65* 61*   Basic Metabolic Panel: Recent Labs  Lab 06/05/18 1616 06/07/18 0404  NA 132* 134*  K 3.8 3.7  CL 97* 101  CO2 27 24  GLUCOSE 119* 112*  BUN 14 14  CREATININE 1.00 1.20*  CALCIUM 8.8* 8.4*   GFR: Estimated Creatinine Clearance: 23.9 mL/min (A) (by C-G formula based on SCr of 1.2 mg/dL (H)). Liver Function Tests: No results for input(s): AST, ALT, ALKPHOS, BILITOT, PROT, ALBUMIN in the last 168 hours. No results for input(s): LIPASE, AMYLASE in the last 168 hours. No results for input(s): AMMONIA in the last 168 hours. Coagulation Profile: No results for input(s): INR, PROTIME in the last 168 hours. Cardiac Enzymes: No results for input(s): CKTOTAL, CKMB, CKMBINDEX, TROPONINI in the last 168 hours. BNP (last 3 results) No results for  input(s): PROBNP in the last 8760 hours. HbA1C: No results for input(s): HGBA1C in the last 72 hours. CBG: No results for input(s): GLUCAP in the last 168 hours. Lipid Profile: No results for input(s): CHOL, HDL, LDLCALC, TRIG, CHOLHDL, LDLDIRECT in the last 72 hours. Thyroid Function Tests: No results for input(s): TSH, T4TOTAL, FREET4, T3FREE, THYROIDAB in the last 72 hours. Anemia Panel: No results for input(s): VITAMINB12, FOLATE, FERRITIN, TIBC, IRON, RETICCTPCT in the last 72  hours. Sepsis Labs: No results for input(s): PROCALCITON, LATICACIDVEN in the last 168 hours.  No results found for this or any previous visit (from the past 240 hour(s)).       Radiology Studies: No results found.      Scheduled Meds: . atorvastatin  20 mg Oral QPM  . calcium-vitamin D  1 tablet Oral Q breakfast  . carvedilol  6.25 mg Oral BID WC  . cholecalciferol  1,000 Units Oral QHS  . eltrombopag  50 mg Oral Daily  . multivitamin  1 tablet Oral Daily  . sertraline  25 mg Oral Daily  . sodium chloride flush  3 mL Intravenous Q12H   Continuous Infusions: . sodium chloride       LOS: 0 days     Cordelia Poche, MD [facility] 06/08/2018, 3:58 PM  If 7PM-7AM, please contact night-coverage [email or web address]

## 2018-06-09 DIAGNOSIS — D649 Anemia, unspecified: Secondary | ICD-10-CM | POA: Diagnosis not present

## 2018-06-09 DIAGNOSIS — I5032 Chronic diastolic (congestive) heart failure: Secondary | ICD-10-CM | POA: Diagnosis not present

## 2018-06-09 DIAGNOSIS — N183 Chronic kidney disease, stage 3 (moderate): Secondary | ICD-10-CM | POA: Diagnosis not present

## 2018-06-09 LAB — CBC
HEMATOCRIT: 33.9 % — AB (ref 36.0–46.0)
HEMOGLOBIN: 11.2 g/dL — AB (ref 12.0–15.0)
MCH: 32.4 pg (ref 26.0–34.0)
MCHC: 33 g/dL (ref 30.0–36.0)
MCV: 98 fL (ref 80.0–100.0)
Platelets: 57 10*3/uL — ABNORMAL LOW (ref 150–400)
RBC: 3.46 MIL/uL — ABNORMAL LOW (ref 3.87–5.11)
RDW: 20.3 % — AB (ref 11.5–15.5)
WBC: 6 10*3/uL (ref 4.0–10.5)
nRBC: 0 % (ref 0.0–0.2)

## 2018-06-09 LAB — DIC (DISSEMINATED INTRAVASCULAR COAGULATION) PANEL
D DIMER QUANT: 4.71 ug{FEU}/mL — AB (ref 0.00–0.50)
INR: 1.27
PROTHROMBIN TIME: 15.7 s — AB (ref 11.4–15.2)

## 2018-06-09 LAB — DIC (DISSEMINATED INTRAVASCULAR COAGULATION)PANEL
Fibrinogen: 354 mg/dL (ref 210–475)
Platelets: 57 10*3/uL — ABNORMAL LOW (ref 150–400)
Smear Review: NONE SEEN
aPTT: 35 seconds (ref 24–36)

## 2018-06-09 LAB — LACTATE DEHYDROGENASE: LDH: 251 U/L — AB (ref 98–192)

## 2018-06-09 LAB — RETICULOCYTES
Immature Retic Fract: 14.3 % (ref 2.3–15.9)
RBC.: 3.46 MIL/uL — AB (ref 3.87–5.11)
RETIC COUNT ABSOLUTE: 203.4 10*3/uL — AB (ref 19.0–186.0)
Retic Ct Pct: 5.9 % — ABNORMAL HIGH (ref 0.4–3.1)

## 2018-06-09 MED ORDER — ASPIRIN 81 MG PO TBEC: 81.0000 mg | DELAYED_RELEASE_TABLET | Freq: Two times a day (BID) | ORAL | 0 refills | Status: DC

## 2018-06-09 NOTE — Clinical Social Work Note (Signed)
Patient has been discharged back to [facility] skilled nursing facility to continue rehab. Facility received insurance authorization, discharge clinicals transmitted to facility and niece advised of discharge. Ms. Dalbert Batman (niece) transported patient to [facility].  Kohler Pellerito Givens, MSW, LCSW Licensed Clinical Social Worker [facility] [phone]

## 2018-06-09 NOTE — Progress Notes (Signed)
Report given to Dora Sims. of [facility].

## 2018-06-09 NOTE — Discharge Instructions (Signed)
Lisa James,  You were admitted because of low hemoglobin. This is secondary to your recent surgery and low platelets. You were given a blood transfusion and are stable. Please hold your aspirin if your platelets are less than 100,000

## 2018-06-10 ENCOUNTER — Ambulatory Visit (INDEPENDENT_AMBULATORY_CARE_PROVIDER_SITE_OTHER): Payer: Medicare Other | Admitting: Orthopedic Surgery

## 2018-06-10 ENCOUNTER — Encounter (INDEPENDENT_AMBULATORY_CARE_PROVIDER_SITE_OTHER): Payer: Self-pay | Admitting: Orthopedic Surgery

## 2018-06-10 ENCOUNTER — Ambulatory Visit (INDEPENDENT_AMBULATORY_CARE_PROVIDER_SITE_OTHER): Payer: Medicare Other

## 2018-06-10 DIAGNOSIS — S42201G Unspecified fracture of upper end of right humerus, subsequent encounter for fracture with delayed healing: Secondary | ICD-10-CM

## 2018-06-10 LAB — HAPTOGLOBIN: Haptoglobin: 123 mg/dL (ref 34–200)

## 2018-06-10 NOTE — Progress Notes (Signed)
Post-Op Visit Note   Patient: Lisa James           Date of Birth: [birth year]           MRN: [id number] Visit Date: 06/10/2018 PCP: Lisa Seashore, MD   Assessment & Plan:  Chief Complaint:  Chief Complaint  Patient presents with  . Right Shoulder - Routine Post Op   Visit Diagnoses:  1. Closed fracture of proximal end of right humerus with delayed healing, unspecified fracture morphology, subsequent encounter     Plan: Lisa James is a patient who is now about 2 weeks out right proximal humerus fracture.  She is been in a sling at a nursing home.  On exam she has actually pretty decent motion and good deltoid function.  Incision is intact and radiographs look good.  At this time would like for her to start doing pendulum exercises 30 rotations clockwise and counterclockwise 3 times a day but stay in the sling until return office visit in 2 weeks.  We will do repeat x-rays at that time and likely discontinue the sling at that time.  I think due to her bone quality and age that she is at risk for some complication.  She does have a baseline amount of shoulder arthritis  Follow-Up Instructions: No follow-ups on file.   Orders:  Orders Placed This Encounter  Procedures  . XR Shoulder Right   No orders of the defined types were placed in this encounter.   Imaging: Xr Shoulder Right  Result Date: 06/10/2018 2 views right proximal humerus demonstrates plate reduction and fracture fixation of proximal humerus fracture.  Shoulder is reduced.  No evidence of hardware complication.  No penetration of screws or pegs into the joint.   PMFS History: Patient Active Problem List   Diagnosis Date Noted  . Acute on chronic anemia 06/05/2018  . Chronic diastolic CHF (congestive heart failure) ([place]) 06/05/2018  . Closed fracture of right proximal humerus 05/30/2018  . Proximal humerus fracture 05/28/2018  . Pressure injury of skin 05/15/2018  . Fall 05/14/2018  . Closed  comminuted right humeral fracture 05/14/2018  . UTI (urinary tract infection) 05/14/2018  . Hyponatremia 05/14/2018  . Leucocytosis 05/14/2018  . Altered mental status 05/13/2015  . Idiopathic thrombocytopenic purpura ([place]) 07/06/2013  . Aortic valve stenosis 06/19/2013  . Immune thrombocytopenic purpura ([place]) 01/21/2013  . GERD (gastroesophageal reflux disease) 12/17/2012  . Type II or unspecified type diabetes mellitus with renal manifestations, not stated as uncontrolled(250.40) 12/10/2012  . Chronic kidney disease (CKD), stage III (moderate) ([place]) 11/22/2012  . Diabetes mellitus ([place]) 07/03/2011  . Depression 07/03/2011  . Non-Hodgkin's lymphoma ([place]) 07/03/2011  . CAD (coronary artery disease) 07/03/2011  . PAD (peripheral artery disease) ([place]) 07/03/2011  . Vitamin D deficiency 07/03/2011  . Subdural hematoma ([place]) 07/03/2011  . Confusion 05/12/2011  . Subdural hematoma, acute ([place]) 05/12/2011  . HYPERLIPIDEMIA TYPE IIB / III 05/10/2009  . OLD MYOCARDIAL INFARCTION 05/10/2009  . Occlusion and stenosis of carotid artery without mention of cerebral infarction 05/10/2009  . HYPERTENSION, BENIGN 11/03/2008  . MITRAL REGURGITATION 10/29/2008  . ISCHEMIC HEART DISEASE 10/29/2008  . SYSTOLIC HEART FAILURE, CHRONIC 10/29/2008   Past Medical History:  Diagnosis Date  . Acute MI, lateral wall ([place])   . Aortic stenosis   . Chronic kidney disease   . Chronic systolic heart failure ([place])   . Coronary artery disease    sees Dr Lisa James every 6 months  . Dementia ([place])   .  Diabetes ([place])   . DM (diabetes mellitus) ([place])    type 2 ; diet controlled  . Heart disease   . HTN (hypertension)   . Immune thrombocytopenic purpura ([place]) 01/21/2013  . Ischemic heart disease   . Kidney disease   . Mitral regurgitation   . Non Hodgkin's lymphoma ([place])    of the throat  . Subdural hematoma, post-traumatic ([place]) 2015   sees Dr Lisa James every 6 months  . Thrombocytopenia ([place])    sees Dr  Lisa James every 2 weeks ;receives Nplate prn    Family History  Problem Relation Age of Onset  . Heart attack Father   . Diabetes Father     Past Surgical History:  Procedure Laterality Date  . BONE MARROW BIOPSY  11/22/2002   left posterior iliac crest bone marrow biopsy and aspirate  . CATARACT EXTRACTION W/ INTRAOCULAR LENS  IMPLANT, BILATERAL Bilateral   . CORONARY ARTERY BYPASS GRAFT  11/20/2007   x5  . EYE SURGERY    . ORIF HUMERUS FRACTURE Right 05/28/2018   Procedure: RIGHT OPEN REDUCTION INTERNAL FIXATION (ORIF) PROXIMAL HUMERUS FRACTURE;  Surgeon: Lisa Pel, MD;  Location: [facility];  Service: Orthopedics;  Laterality: Right;  . submental lymph node excisional biopsy  10/22/2002   Social History   Occupational History    Comment: retired  Tobacco Use  . Smoking status: Never Smoker  . Smokeless tobacco: Never Used  . Tobacco comment: never used tobacco.  Substance and Sexual Activity  . Alcohol use: No    Alcohol/week: 0.0 standard drinks  . Drug use: No  . Sexual activity: Never    Birth control/protection: Abstinence

## 2018-06-15 ENCOUNTER — Telehealth: Payer: Self-pay

## 2018-06-15 NOTE — Telephone Encounter (Signed)
Spoke with [place] at [facility] - request a CBC be drawn weekly and results faxed to [place].  Fax number was provided.

## 2018-06-17 ENCOUNTER — Telehealth: Payer: Self-pay

## 2018-06-17 NOTE — Telephone Encounter (Signed)
Faxed instructions to [facility] regarding increasing Promacta to 100 mg daily (from 50 mg daily) also to repeat CBC in 2 weeks and fax [place] with results.

## 2018-06-24 ENCOUNTER — Ambulatory Visit (INDEPENDENT_AMBULATORY_CARE_PROVIDER_SITE_OTHER): Payer: Medicare Other | Admitting: Orthopedic Surgery

## 2018-06-24 ENCOUNTER — Encounter (INDEPENDENT_AMBULATORY_CARE_PROVIDER_SITE_OTHER): Payer: Self-pay | Admitting: Orthopedic Surgery

## 2018-06-24 ENCOUNTER — Ambulatory Visit (INDEPENDENT_AMBULATORY_CARE_PROVIDER_SITE_OTHER): Payer: Medicare Other

## 2018-06-24 DIAGNOSIS — S42201G Unspecified fracture of upper end of right humerus, subsequent encounter for fracture with delayed healing: Secondary | ICD-10-CM | POA: Diagnosis not present

## 2018-06-24 NOTE — Progress Notes (Signed)
Post-Op Visit Note   Patient: Lisa James           Date of Birth: 09/08/1927           MRN: [id number] Visit Date: 06/24/2018 PCP: Merrilee Seashore, MD   Assessment & Plan:  Chief Complaint:  Chief Complaint  Patient presents with  . Right Shoulder - Follow-up   Visit Diagnoses:  1. Closed fracture of proximal end of right humerus with delayed healing, unspecified fracture morphology, subsequent encounter     Plan: Alizabeth is a patient is a month out right proximal humerus fracture fixation.  On exam she has excellent passive range of motion with no pain.  Deltoid fires.  Incision is intact.  At this time I want her to come out of the sling.  Okay to do some walking but I do want her to do any lifting with that right arm.  Continue with therapy for range of motion strengthening.  4-week return with repeat radiographs.  X-rays today do not show much in terms of change in hardware position.  I think in general though clinically she is looks good.  Follow-Up Instructions: Return in about 4 weeks (around 07/22/2018).   Orders:  Orders Placed This Encounter  Procedures  . XR Shoulder Right   No orders of the defined types were placed in this encounter.   Imaging: Xr Shoulder Right  Result Date: 06/24/2018 AP lateral right shoulder reviewed.  Hardware unchanged in appearance and position.  No evidence of loosening.  Shoulder is reduced.   PMFS History: Patient Active Problem List   Diagnosis Date Noted  . Acute on chronic anemia 06/05/2018  . Chronic diastolic CHF (congestive heart failure) ([place]) 06/05/2018  . Closed fracture of right proximal humerus 05/30/2018  . Proximal humerus fracture 05/28/2018  . Pressure injury of skin 05/15/2018  . Fall 05/14/2018  . Closed comminuted right humeral fracture 05/14/2018  . UTI (urinary tract infection) 05/14/2018  . Hyponatremia 05/14/2018  . Leucocytosis 05/14/2018  . Altered mental status 05/13/2015  . Idiopathic  thrombocytopenic purpura ([place]) 07/06/2013  . Aortic valve stenosis 06/19/2013  . Immune thrombocytopenic purpura ([place]) 01/21/2013  . GERD (gastroesophageal reflux disease) 12/17/2012  . Type II or unspecified type diabetes mellitus with renal manifestations, not stated as uncontrolled(250.40) 12/10/2012  . Chronic kidney disease (CKD), stage III (moderate) ([place]) 11/22/2012  . Diabetes mellitus ([place]) 07/03/2011  . Depression 07/03/2011  . Non-Hodgkin's lymphoma ([place]) 07/03/2011  . CAD (coronary artery disease) 07/03/2011  . PAD (peripheral artery disease) ([place]) 07/03/2011  . Vitamin D deficiency 07/03/2011  . Subdural hematoma ([place]) 07/03/2011  . Confusion 05/12/2011  . Subdural hematoma, acute ([place]) 05/12/2011  . HYPERLIPIDEMIA TYPE IIB / III 05/10/2009  . OLD MYOCARDIAL INFARCTION 05/10/2009  . Occlusion and stenosis of carotid artery without mention of cerebral infarction 05/10/2009  . HYPERTENSION, BENIGN 11/03/2008  . MITRAL REGURGITATION 10/29/2008  . ISCHEMIC HEART DISEASE 10/29/2008  . SYSTOLIC HEART FAILURE, CHRONIC 10/29/2008   Past Medical History:  Diagnosis Date  . Acute MI, lateral wall ([place])   . Aortic stenosis   . Chronic kidney disease   . Chronic systolic heart failure ([place])   . Coronary artery disease    sees Dr Claiborne Billings every 6 months  . Dementia ([place])   . Diabetes ([place])   . DM (diabetes mellitus) ([place])    type 2 ; diet controlled  . Heart disease   . HTN (hypertension)   . Immune thrombocytopenic purpura ([place])  01/21/2013  . Ischemic heart disease   . Kidney disease   . Mitral regurgitation   . Non Hodgkin's lymphoma ([place])    of the throat  . Subdural hematoma, post-traumatic ([place]) 2015   sees Dr Sherwood Gambler every 6 months  . Thrombocytopenia ([place])    sees Dr Marin Olp every 2 weeks ;receives Nplate prn    Family History  Problem Relation Age of Onset  . Heart attack Father   . Diabetes Father     Past Surgical History:  Procedure Laterality Date  .  BONE MARROW BIOPSY  11/22/2002   left posterior iliac crest bone marrow biopsy and aspirate  . CATARACT EXTRACTION W/ INTRAOCULAR LENS  IMPLANT, BILATERAL Bilateral   . CORONARY ARTERY BYPASS GRAFT  11/20/2007   x5  . EYE SURGERY    . ORIF HUMERUS FRACTURE Right 05/28/2018   Procedure: RIGHT OPEN REDUCTION INTERNAL FIXATION (ORIF) PROXIMAL HUMERUS FRACTURE;  Surgeon: Meredith Pel, MD;  Location: [facility];  Service: Orthopedics;  Laterality: Right;  . submental lymph node excisional biopsy  10/22/2002   Social History   Occupational History    Comment: retired  Tobacco Use  . Smoking status: Never Smoker  . Smokeless tobacco: Never Used  . Tobacco comment: never used tobacco.  Substance and Sexual Activity  . Alcohol use: No    Alcohol/week: 0.0 standard drinks  . Drug use: No  . Sexual activity: Never    Birth control/protection: Abstinence

## 2018-06-25 ENCOUNTER — Other Ambulatory Visit: Payer: Medicare Other

## 2018-06-25 ENCOUNTER — Emergency Department (HOSPITAL_COMMUNITY): Payer: Medicare Other

## 2018-06-25 ENCOUNTER — Ambulatory Visit: Payer: Medicare Other

## 2018-06-25 ENCOUNTER — Encounter (HOSPITAL_COMMUNITY): Payer: Self-pay | Admitting: Emergency Medicine

## 2018-06-25 ENCOUNTER — Emergency Department (HOSPITAL_COMMUNITY)
Admission: EM | Admit: 2018-06-25 | Discharge: 2018-06-26 | Disposition: A | Discharge status: Home / Self Care | Payer: Medicare Other | Attending: Emergency Medicine | Admitting: Emergency Medicine

## 2018-06-25 ENCOUNTER — Other Ambulatory Visit: Payer: Self-pay

## 2018-06-25 DIAGNOSIS — W19XXXA Unspecified fall, initial encounter: Secondary | ICD-10-CM | POA: Insufficient documentation

## 2018-06-25 DIAGNOSIS — F0390 Unspecified dementia without behavioral disturbance: Secondary | ICD-10-CM | POA: Diagnosis not present

## 2018-06-25 DIAGNOSIS — Z79899 Other long term (current) drug therapy: Secondary | ICD-10-CM | POA: Insufficient documentation

## 2018-06-25 DIAGNOSIS — Y92129 Unspecified place in nursing home as the place of occurrence of the external cause: Secondary | ICD-10-CM | POA: Insufficient documentation

## 2018-06-25 DIAGNOSIS — I5032 Chronic diastolic (congestive) heart failure: Secondary | ICD-10-CM | POA: Insufficient documentation

## 2018-06-25 DIAGNOSIS — Z7982 Long term (current) use of aspirin: Secondary | ICD-10-CM | POA: Diagnosis not present

## 2018-06-25 DIAGNOSIS — N183 Chronic kidney disease, stage 3 (moderate): Secondary | ICD-10-CM | POA: Diagnosis not present

## 2018-06-25 DIAGNOSIS — Y999 Unspecified external cause status: Secondary | ICD-10-CM | POA: Diagnosis not present

## 2018-06-25 DIAGNOSIS — Y939 Activity, unspecified: Secondary | ICD-10-CM | POA: Diagnosis not present

## 2018-06-25 DIAGNOSIS — E119 Type 2 diabetes mellitus without complications: Secondary | ICD-10-CM | POA: Diagnosis not present

## 2018-06-25 DIAGNOSIS — S0083XA Contusion of other part of head, initial encounter: Secondary | ICD-10-CM | POA: Diagnosis present

## 2018-06-25 DIAGNOSIS — I130 Hypertensive heart and chronic kidney disease with heart failure and stage 1 through stage 4 chronic kidney disease, or unspecified chronic kidney disease: Secondary | ICD-10-CM | POA: Diagnosis not present

## 2018-06-25 LAB — CBC WITH DIFFERENTIAL/PLATELET
ABS IMMATURE GRANULOCYTES: 0.02 10*3/uL (ref 0.00–0.07)
Basophils Absolute: 0 10*3/uL (ref 0.0–0.1)
Basophils Relative: 0 %
Eosinophils Absolute: 0.2 10*3/uL (ref 0.0–0.5)
Eosinophils Relative: 3 %
HCT: 34.7 % — ABNORMAL LOW (ref 36.0–46.0)
Hemoglobin: 11.5 g/dL — ABNORMAL LOW (ref 12.0–15.0)
IMMATURE GRANULOCYTES: 0 %
LYMPHS ABS: 1.2 10*3/uL (ref 0.7–4.0)
LYMPHS PCT: 20 %
MCH: 32.9 pg (ref 26.0–34.0)
MCHC: 33.1 g/dL (ref 30.0–36.0)
MCV: 99.1 fL (ref 80.0–100.0)
MONOS PCT: 13 %
Monocytes Absolute: 0.8 10*3/uL (ref 0.1–1.0)
NEUTROS ABS: 3.8 10*3/uL (ref 1.7–7.7)
NEUTROS PCT: 64 %
PLATELETS: 74 10*3/uL — AB (ref 150–400)
RBC: 3.5 MIL/uL — ABNORMAL LOW (ref 3.87–5.11)
RDW: 17.5 % — ABNORMAL HIGH (ref 11.5–15.5)
WBC: 6.1 10*3/uL (ref 4.0–10.5)
nRBC: 0 % (ref 0.0–0.2)

## 2018-06-25 LAB — I-STAT CHEM 8, ED
BUN: 13 mg/dL (ref 8–23)
CHLORIDE: 97 mmol/L — AB (ref 98–111)
CREATININE: 1 mg/dL (ref 0.44–1.00)
Calcium, Ion: 1.08 mmol/L — ABNORMAL LOW (ref 1.15–1.40)
GLUCOSE: 128 mg/dL — AB (ref 70–99)
HCT: 35 % — ABNORMAL LOW (ref 36.0–46.0)
Hemoglobin: 11.9 g/dL — ABNORMAL LOW (ref 12.0–15.0)
POTASSIUM: 3.2 mmol/L — AB (ref 3.5–5.1)
Sodium: 134 mmol/L — ABNORMAL LOW (ref 135–145)
TCO2: 29 mmol/L (ref 22–32)

## 2018-06-25 NOTE — ED Provider Notes (Signed)
[facility] EMERGENCY DEPARTMENT Provider Note   CSN: [id number] Arrival date & time: 06/25/18  2021     History   Chief Complaint No chief complaint on file.   HPI Lisa James is a 84 y.o. female.  Patient is an 84 year old female with past medical history significant for polio presenting for unwitnessed mechanical fall at her facility today with a small hematoma on the back of her head.  Patient states she is in no pain, does not remember what happened.  Family has arrived at bedside and states that patient is acting normally.  Patient does not take any blood thinners.  Is moving all extremities without difficulties.  Patient has a history of multiple falls, recently just discharged from the hospital for right shoulder injury and family states that she likely try to get up to the bathroom as she forgot that she was unable to get up without help  The history is provided by the patient. No language interpreter was used.    Past Medical History:  Diagnosis Date  . Acute MI, lateral wall ([place])   . Aortic stenosis   . Chronic kidney disease   . Chronic systolic heart failure ([place])   . Coronary artery disease    sees Dr Claiborne Billings every 6 months  . Dementia ([place])   . Diabetes ([place])   . DM (diabetes mellitus) ([place])    type 2 ; diet controlled  . Heart disease   . HTN (hypertension)   . Immune thrombocytopenic purpura ([place]) 01/21/2013  . Ischemic heart disease   . Kidney disease   . Mitral regurgitation   . Non Hodgkin's lymphoma ([place])    of the throat  . Subdural hematoma, post-traumatic ([place]) 2015   sees Dr Sherwood Gambler every 6 months  . Thrombocytopenia ([place])    sees Dr Marin Olp every 2 weeks ;receives Nplate prn    Patient Active Problem List   Diagnosis Date Noted  . Acute on chronic anemia 06/05/2018  . Chronic diastolic CHF (congestive heart failure) ([place]) 06/05/2018  . Closed fracture of right proximal humerus 05/30/2018  . Proximal humerus fracture  05/28/2018  . Pressure injury of skin 05/15/2018  . Fall 05/14/2018  . Closed comminuted right humeral fracture 05/14/2018  . UTI (urinary tract infection) 05/14/2018  . Hyponatremia 05/14/2018  . Leucocytosis 05/14/2018  . Altered mental status 05/13/2015  . Idiopathic thrombocytopenic purpura ([place]) 07/06/2013  . Aortic valve stenosis 06/19/2013  . Immune thrombocytopenic purpura ([place]) 01/21/2013  . GERD (gastroesophageal reflux disease) 12/17/2012  . Type II or unspecified type diabetes mellitus with renal manifestations, not stated as uncontrolled(250.40) 12/10/2012  . Chronic kidney disease (CKD), stage III (moderate) ([place]) 11/22/2012  . Diabetes mellitus ([place]) 07/03/2011  . Depression 07/03/2011  . Non-Hodgkin's lymphoma ([place]) 07/03/2011  . CAD (coronary artery disease) 07/03/2011  . PAD (peripheral artery disease) ([place]) 07/03/2011  . Vitamin D deficiency 07/03/2011  . Subdural hematoma ([place]) 07/03/2011  . Confusion 05/12/2011  . Subdural hematoma, acute ([place]) 05/12/2011  . HYPERLIPIDEMIA TYPE IIB / III 05/10/2009  . OLD MYOCARDIAL INFARCTION 05/10/2009  . Occlusion and stenosis of carotid artery without mention of cerebral infarction 05/10/2009  . HYPERTENSION, BENIGN 11/03/2008  . MITRAL REGURGITATION 10/29/2008  . ISCHEMIC HEART DISEASE 10/29/2008  . SYSTOLIC HEART FAILURE, CHRONIC 10/29/2008    Past Surgical History:  Procedure Laterality Date  . BONE MARROW BIOPSY  11/22/2002   left posterior iliac crest bone marrow biopsy and aspirate  . CATARACT EXTRACTION  W/ INTRAOCULAR LENS  IMPLANT, BILATERAL Bilateral   . CORONARY ARTERY BYPASS GRAFT  11/20/2007   x5  . EYE SURGERY    . ORIF HUMERUS FRACTURE Right 05/28/2018   Procedure: RIGHT OPEN REDUCTION INTERNAL FIXATION (ORIF) PROXIMAL HUMERUS FRACTURE;  Surgeon: Meredith Pel, MD;  Location: [facility];  Service: Orthopedics;  Laterality: Right;  . submental lymph node excisional biopsy  10/22/2002     OB History    None      Home Medications    Prior to Admission medications   Medication Sig Start Date End Date Taking? Authorizing Provider  aspirin 81 MG EC tablet Take 1 tablet (81 mg total) by mouth 2 (two) times daily. Hold for platelets less than 100,000 06/09/18   Mariel Aloe, MD  atorvastatin (LIPITOR) 20 MG tablet TAKE 1 TABLET (20 MG TOTAL) BY MOUTH DAILY. Patient taking differently: Take 20 mg by mouth at bedtime.  01/29/16   Darlyne Russian, MD  Calcium Carbonate-Vitamin D (CALCIUM-VITAMIN D) 500-200 MG-UNIT per tablet Take 1 tablet by mouth daily.     [provider]  carvedilol (COREG) 6.25 MG tablet Take 1 tablet (6.25 mg total) by mouth 2 (two) times daily with a meal. 07/28/15   Daub, Loura Back, MD  Cholecalciferol (VITAMIN D) 1000 UNITS capsule Take 1,000 Units by mouth at bedtime.     [provider]  docusate sodium (COLACE) 100 MG capsule Take 1 capsule (100 mg total) by mouth 2 (two) times daily. Patient not taking: Reported on 06/05/2018 06/01/18   Meredith Pel, MD  HYDROcodone-acetaminophen (NORCO/VICODIN) 5-325 MG tablet Take 1 tablet by mouth every 6 (six) hours as needed (for pain). 06/07/18   Mariel Aloe, MD  LACTOBACILLUS PO Take 2 capsules by mouth 3 (three) times daily.    [provider]  methocarbamol (ROBAXIN) 500 MG tablet Take 1 tablet (500 mg total) by mouth every 6 (six) hours as needed for muscle spasms. 05/29/18   Meredith Pel, MD  Multiple Vitamins-Minerals (PRESERVISION AREDS PO) Take 1 tablet by mouth daily.    [provider]  OVER THE COUNTER MEDICATION Aquacel Dressing: "Maintain to right shoulder until ortho follow-up" 06/01/18   [provider]  PROMACTA 50 MG tablet TAKE 1 TABLET (50 MG TOTAL) BY MOUTH DAILY AT 6 AM. TAKE ON AN EMPTY STOMACH 1 HR BEFORE OR 2 HRS AFTER FOOD, SEPARATE FROM VITAMINS Patient taking differently: Take 50 mg by mouth See admin instructions. Take 50 mg (1 tablet) by mouth  once a day on an empty stomach, either 1 hour before food or 2 hours after food & separate from vitamins 05/27/18   Truitt Merle, MD  sertraline (ZOLOFT) 25 MG tablet Take 25 mg by mouth daily.    [provider]  traZODone (DESYREL) 50 MG tablet Take 50-100 mg by mouth at bedtime as needed for sleep (for 14 days- until 06/15/18).  06/01/18 06/15/18  [provider]    Family History Family History  Problem Relation Age of Onset  . Heart attack Father   . Diabetes Father     Social History Social History   Tobacco Use  . Smoking status: Never Smoker  . Smokeless tobacco: Never Used  . Tobacco comment: never used tobacco.  Substance Use Topics  . Alcohol use: No    Alcohol/week: 0.0 standard drinks  . Drug use: No     Allergies   Ace inhibitors   Review of Systems Review  of Systems  Unable to perform ROS: Dementia     Physical Exam Updated Vital Signs BP (!) 181/65   Pulse 70   Temp 98.5 F (36.9 C) (Oral)   Resp 19   Ht _0  (1.473 m)   Wt 55.6 kg   LMP  (LMP Unknown)   SpO2 98%   BMI 25.62 kg/m   Physical Exam  Constitutional: She appears well-developed and well-nourished. No distress.  HENT:  Head: Normocephalic.  Hematoma on the L back of head, no laceration noted. Small about of bleeding noted.   Eyes: Conjunctivae are normal.  Neck: Neck supple.  Cardiovascular: Normal rate and regular rhythm.  No murmur heard. Pulmonary/Chest: Effort normal and breath sounds normal. No respiratory distress.  Abdominal: Soft. There is no tenderness.  Musculoskeletal: She exhibits no edema.  Neurological: She is alert.  Skin: Skin is warm and dry.  Psychiatric: She has a normal mood and affect.  Nursing note and vitals reviewed.    ED Treatments / Results  Labs (all labs ordered are listed, but only abnormal results are displayed) Labs Reviewed  CBC WITH DIFFERENTIAL/PLATELET - Abnormal; Notable for the following components:      Result  Value   RBC 3.50 (*)    Hemoglobin 11.5 (*)    HCT 34.7 (*)    RDW 17.5 (*)    Platelets 74 (*)    All other components within normal limits  I-STAT CHEM 8, ED - Abnormal; Notable for the following components:   Sodium 134 (*)    Potassium 3.2 (*)    Chloride 97 (*)    Glucose, Bld 128 (*)    Calcium, Ion 1.08 (*)    Hemoglobin 11.9 (*)    HCT 35.0 (*)    All other components within normal limits    EKG None  Radiology Ct Head Wo Contrast  Result Date: 06/25/2018 CLINICAL DATA:  Fall, hit head on toilet. EXAM: CT HEAD WITHOUT CONTRAST CT CERVICAL SPINE WITHOUT CONTRAST TECHNIQUE: Multidetector CT imaging of the head and cervical spine was performed following the standard protocol without intravenous contrast. Multiplanar CT image reconstructions of the cervical spine were also generated. COMPARISON:  05/14/2017 FINDINGS: CT HEAD FINDINGS Brain: Large chronic calcified extra-axial subdural collection again noted measuring 3 cm in thickness. This is unchanged dating back to prior study and dating back to old studies from 2016. This has mass effect on the left cerebral hemisphere and 7 mm left right midline shift, stable. No acute hemorrhage or hydrocephalus. No acute infarction. Vascular: No hyperdense vessel or unexpected calcification. Skull: No acute calvarial abnormality. Sinuses/Orbits: Visualized paranasal sinuses and mastoids clear. Orbital soft tissues unremarkable. Other: Soft tissue swelling over the left occipital region. CT CERVICAL SPINE FINDINGS Alignment: Slight subluxation of C4 on C5 related to facet disease. Skull base and vertebrae: No acute fracture. No primary bone lesion or focal pathologic process. Soft tissues and spinal canal: No prevertebral fluid or swelling. No visible canal hematoma. Disc levels: Severe diffuse degenerative disc and facet disease throughout the cervical spine. Upper chest: No acute findings Other: Carotid artery calcifications. IMPRESSION: Stable  large calcified subdural collection in the left cerebral hemisphere with stable mass effect and midline shift dating back to 2016. No acute intracranial abnormality. Diffuse cervical spondylosis. No acute bony abnormality in the cervical spine. Electronically Signed   By: Rolm Baptise M.D.   On: 06/25/2018 21:08   Ct Cervical Spine Wo Contrast  Result Date: 06/25/2018 CLINICAL DATA:  Fall, hit head on toilet. EXAM: CT HEAD WITHOUT CONTRAST CT CERVICAL SPINE WITHOUT CONTRAST TECHNIQUE: Multidetector CT imaging of the head and cervical spine was performed following the standard protocol without intravenous contrast. Multiplanar CT image reconstructions of the cervical spine were also generated. COMPARISON:  05/14/2017 FINDINGS: CT HEAD FINDINGS Brain: Large chronic calcified extra-axial subdural collection again noted measuring 3 cm in thickness. This is unchanged dating back to prior study and dating back to old studies from 2016. This has mass effect on the left cerebral hemisphere and 7 mm left right midline shift, stable. No acute hemorrhage or hydrocephalus. No acute infarction. Vascular: No hyperdense vessel or unexpected calcification. Skull: No acute calvarial abnormality. Sinuses/Orbits: Visualized paranasal sinuses and mastoids clear. Orbital soft tissues unremarkable. Other: Soft tissue swelling over the left occipital region. CT CERVICAL SPINE FINDINGS Alignment: Slight subluxation of C4 on C5 related to facet disease. Skull base and vertebrae: No acute fracture. No primary bone lesion or focal pathologic process. Soft tissues and spinal canal: No prevertebral fluid or swelling. No visible canal hematoma. Disc levels: Severe diffuse degenerative disc and facet disease throughout the cervical spine. Upper chest: No acute findings Other: Carotid artery calcifications. IMPRESSION: Stable large calcified subdural collection in the left cerebral hemisphere with stable mass effect and midline shift dating  back to 2016. No acute intracranial abnormality. Diffuse cervical spondylosis. No acute bony abnormality in the cervical spine. Electronically Signed   By: Rolm Baptise M.D.   On: 06/25/2018 21:08   Xr Shoulder Right  Result Date: 06/24/2018 AP lateral right shoulder reviewed.  Hardware unchanged in appearance and position.  No evidence of loosening.  Shoulder is reduced.   Procedures Procedures (including critical care time)  Medications Ordered in ED Medications - No data to display   Initial Impression / Assessment and Plan / ED Course  I have reviewed the triage vital signs and the nursing notes.  Pertinent labs & imaging results that were available during my care of the patient were reviewed by me and considered in my medical decision making (see chart for details).     Patient is a 84 y.o. female with PMHx of recurrent falls presenting for unwitnessed mechanical fall with small hematoma on the back of the head, no other injuries noted.  Patient is acting normally per family, and not complaining of any other areas of pain.  CT head and neck unremarkable.  Hematoma was dressed at bedside, nothing to suture or staple noted.  Labs were unremarkable at baseline.  Patient safe for discharge. Findings were discussed with patient and family at bedside.   Final Clinical Impressions(s) / ED Diagnoses   Final diagnoses:  Fall, initial encounter    ED Discharge Orders    None       Erskine Squibb, MD 06/25/18 [id number]    Varney Biles, MD 06/26/18 [phone]

## 2018-06-25 NOTE — ED Triage Notes (Signed)
Pt presents to ED from [facility] via [facility].Pt fell in the bathroom and hit her head on the toilet. No LOC.  BP 188/94 98%RA HR 80 CBG 207

## 2018-06-25 NOTE — ED Notes (Signed)
Patient transported to CT 

## 2018-06-25 NOTE — ED Notes (Signed)
Applied new bandage to pt's head lac.

## 2018-06-26 NOTE — ED Notes (Signed)
ptar canceled

## 2018-07-10 ENCOUNTER — Inpatient Hospital Stay (HOSPITAL_BASED_OUTPATIENT_CLINIC_OR_DEPARTMENT_OTHER): Payer: Medicare Other | Admitting: Hematology

## 2018-07-10 ENCOUNTER — Inpatient Hospital Stay: Payer: Medicare Other | Attending: Hematology

## 2018-07-10 ENCOUNTER — Encounter: Payer: Self-pay | Admitting: Hematology

## 2018-07-10 VITALS — BP 192/81 | HR 70 | Temp 98.4°F | Resp 18 | Ht <= 58 in | Wt 94.9 lb

## 2018-07-10 DIAGNOSIS — E119 Type 2 diabetes mellitus without complications: Secondary | ICD-10-CM | POA: Insufficient documentation

## 2018-07-10 DIAGNOSIS — R630 Anorexia: Secondary | ICD-10-CM

## 2018-07-10 DIAGNOSIS — R296 Repeated falls: Secondary | ICD-10-CM | POA: Diagnosis not present

## 2018-07-10 DIAGNOSIS — I10 Essential (primary) hypertension: Secondary | ICD-10-CM | POA: Insufficient documentation

## 2018-07-10 DIAGNOSIS — D693 Immune thrombocytopenic purpura: Secondary | ICD-10-CM | POA: Insufficient documentation

## 2018-07-10 DIAGNOSIS — F0390 Unspecified dementia without behavioral disturbance: Secondary | ICD-10-CM | POA: Diagnosis not present

## 2018-07-10 LAB — CBC WITH DIFFERENTIAL/PLATELET
Abs Immature Granulocytes: 0.03 10*3/uL (ref 0.00–0.07)
BASOS ABS: 0 10*3/uL (ref 0.0–0.1)
Basophils Relative: 0 %
EOS PCT: 2 %
Eosinophils Absolute: 0.2 10*3/uL (ref 0.0–0.5)
HCT: 35.7 % — ABNORMAL LOW (ref 36.0–46.0)
Hemoglobin: 12.2 g/dL (ref 12.0–15.0)
Immature Granulocytes: 1 %
Lymphocytes Relative: 24 %
Lymphs Abs: 1.5 10*3/uL (ref 0.7–4.0)
MCH: 34.3 pg — ABNORMAL HIGH (ref 26.0–34.0)
MCHC: 34.2 g/dL (ref 30.0–36.0)
MCV: 100.3 fL — ABNORMAL HIGH (ref 80.0–100.0)
Monocytes Absolute: 0.5 10*3/uL (ref 0.1–1.0)
Monocytes Relative: 7 %
NRBC: 0 % (ref 0.0–0.2)
Neutro Abs: 4.2 10*3/uL (ref 1.7–7.7)
Neutrophils Relative %: 66 %
Platelets: 75 10*3/uL — ABNORMAL LOW (ref 150–400)
RBC: 3.56 MIL/uL — ABNORMAL LOW (ref 3.87–5.11)
RDW: 16.8 % — ABNORMAL HIGH (ref 11.5–15.5)
WBC: 6.4 10*3/uL (ref 4.0–10.5)

## 2018-07-10 MED ORDER — ELTROMBOPAG OLAMINE 75 MG PO TABS: 75.0000 mg | ORAL_TABLET | Freq: Every day | ORAL | 0 refills | Status: DC

## 2018-07-10 NOTE — Progress Notes (Signed)
Lisa James   [phone] Fax:(336) [phone]   Clinic Follow up Note   Patient Care Team: Merrilee Seashore, MD as PCP - General (Internal Medicine) Truitt Merle, MD as Consulting Physician (Hematology)  Date of Service:  07/10/2018  CHIEF COMPLAINT: F/u of ITP  PREVIOUS AND CURRENT THERAPY:  -Nplate 30mg/kg every 3 weeks to maintain plt>50K and <400K and dose adjust as needed, last dose on 04/23/18.  -I swithced her to oral Promacta 567mdaily in 04/2018. Increased to 7577maily on 07/10/18  INTERVAL HISTORY:  Lisa James here for a follow up of her ITP. Since her last visit she has been to the ED or hospital multiple time for anemia, hyponatremia, arm fracture and fall.  She presents to the clinic today with her Niece. [facility] and that effected who she was. Seh would pass out, broke her shoulder, and developed shingles. She is recovering well. She has mildly limited right arm ROM. [facility] only [facility] recently due [facility]condition [facility]     REVIEW OF SYSTEMS:   Constitutional: Denies fevers, chills or abnormal weight loss Eyes: Denies blurriness of vision Ears, nose, mouth, throat, and face: Denies mucositis or sore throat Respiratory: Denies cough, dyspnea or wheezes Cardiovascular: Denies palpitation, chest discomfort or lower extremity swelling Gastrointestinal:  Denies nausea, heartburn or change in bowel habits Skin: Denies abnormal skin rashes MSK: (+) Limited ROM of right shouler Lymphatics: Denies new lymphadenopathy or easy bruising Neurological:Denies numbness, tingling or new weaknesses Behavioral/Psych: Mood is stable, no new changes  All other systems were reviewed with the patient and are negative.  MEDICAL HISTORY:  Past Medical History:  Diagnosis Date  . Acute MI, lateral  wall ([place] . Aortic stenosis   . Chronic kidney disease   . Chronic systolic heart failure ([facility] . Coronary artery disease    sees Dr KelClaiborne Billingsery 6 months  . Dementia ([place] . Diabetes ([place] . DM (diabetes mellitus) ([place]  type 2 ; diet controlled  . Heart disease   . HTN (hypertension)   . Immune thrombocytopenic purpura ([place]/09/2012  . Ischemic heart disease   . Kidney disease   . Mitral regurgitation   . Non Hodgkin's lymphoma ([facility]  of the throat  . Subdural hematoma, post-traumatic (HCC[place]   sees Dr NudSherwood Gamblerery 6 months  . Thrombocytopenia (HC[place]  sees Dr EnnMarin Olpery 2 weeks ;receives Nplate prn    SURGICAL HISTORY: Past Surgical History:  Procedure Laterality Date  . BONE MARROW BIOPSY  11/22/2002   left posterior iliac crest bone marrow biopsy and aspirate  . CATARACT EXTRACTION W/ INTRAOCULAR LENS  IMPLANT, BILATERAL Bilateral   . CORONARY ARTERY BYPASS GRAFT  11/20/2007   x5  . EYE SURGERY    . ORIF HUMERUS FRACTURE Right 05/28/2018   Procedure: RIGHT OPEN REDUCTION INTERNAL FIXATION (ORIF) PROXIMAL HUMERUS FRACTURE;  Surgeon: DeaMeredith PelD;  [facility];  Laterality: Right;  . submental lymph node excisional biopsy  10/22/2002    I have reviewed the social history and family history with the patient and they are unchanged from previous note.  ALLERGIES:  is allergic to ace inhibitors.  MEDICATIONS:  Current Outpatient Medications  Medication Sig Dispense Refill  .  Calcium Carbonate-Vitamin D (CALCIUM-VITAMIN D) 500-200 MG-UNIT per tablet Take 1 tablet by mouth daily.     . carvedilol (COREG) 6.25 MG tablet Take 1 tablet (6.25 mg total) by mouth 2 (two) times daily with a meal. 180 tablet 1  . Cholecalciferol (VITAMIN D) 1000 UNITS capsule Take 1,000 Units by mouth at bedtime.     . docusate sodium (COLACE) 100 MG capsule Take 1 capsule (100 mg total) by mouth 2 (two) times daily. 10 capsule 0  .  HYDROcodone-acetaminophen (NORCO/VICODIN) 5-325 MG tablet Take 1 tablet by mouth every 6 (six) hours as needed (for pain). 5 tablet 0  . LACTOBACILLUS PO Take 2 capsules by mouth 3 (three) times daily.    . methocarbamol (ROBAXIN) 500 MG tablet Take 1 tablet (500 mg total) by mouth every 6 (six) hours as needed for muscle spasms. 30 tablet 0  . Multiple Vitamins-Minerals (PRESERVISION AREDS PO) Take 1 tablet by mouth daily.    Marland Kitchen OVER THE COUNTER MEDICATION Aquacel Dressing: "Maintain to right shoulder until ortho follow-up"    . sertraline (ZOLOFT) 25 MG tablet Take 25 mg by mouth daily.    Marland Kitchen spironolactone (ALDACTONE) 100 MG tablet Take 100 mg by mouth daily.    Marland Kitchen atorvastatin (LIPITOR) 20 MG tablet TAKE 1 TABLET (20 MG TOTAL) BY MOUTH DAILY. (Patient taking differently: Take 20 mg by mouth at bedtime. ) 30 tablet 0  . eltrombopag (PROMACTA) 75 MG tablet Take 1 tablet (75 mg total) by mouth daily. Take on an empty stomach 1 hour before meals or 2 hours after. 30 tablet 0  . traZODone (DESYREL) 50 MG tablet Take 50-100 mg by mouth at bedtime as needed for sleep (for 14 days- until 06/15/18).      No current facility-administered medications for this visit.     PHYSICAL EXAMINATION: ECOG PERFORMANCE STATUS: 2 - Symptomatic, <50% confined to bed  Vitals:   07/10/18 1436  BP: (!) 192/81  Pulse: 70  Resp: 18  Temp: 98.4 F (36.9 C)  SpO2: 95%   Filed Weights   07/10/18 1436  Weight: 94 lb 14.4 oz (43 kg)    GENERAL:alert, no distress and comfortable SKIN: skin color, texture, turgor are normal, no rashes or significant lesions EYES: normal, Conjunctiva are pink and non-injected, sclera clear OROPHARYNX:no exudate, no erythema and lips, buccal mucosa, and tongue normal  NECK: supple, thyroid normal size, non-tender, without nodularity LYMPH:  no palpable lymphadenopathy in the cervical, axillary or inguinal LUNGS: clear to auscultation and percussion with normal breathing  effort HEART: regular rate & rhythm and no murmurs and no lower extremity edema ABDOMEN:abdomen soft, non-tender and normal bowel sounds Musculoskeletal:no cyanosis of digits and no clubbing  NEURO: alert & oriented x 3 with fluent speech, no focal motor/sensory deficits  LABORATORY DATA:  I have reviewed the data as listed CBC Latest Ref Rng & Units 07/10/2018 06/25/2018 06/25/2018  WBC 4.0 - 10.5 K/uL 6.4 - 6.1  Hemoglobin 12.0 - 15.0 g/dL 12.2 11.9(L) 11.5(L)  Hematocrit 36.0 - 46.0 % 35.7(L) 35.0(L) 34.7(L)  Platelets 150 - 400 K/uL 75(L) - 74(L)     CMP Latest Ref Rng & Units 06/25/2018 06/07/2018 06/05/2018  Glucose 70 - 99 mg/dL 128(H) 112(H) 119(H)  BUN 8 - 23 mg/dL _0 Creatinine 0.44 - 1.00 mg/dL 1.00 1.20(H) 1.00  Sodium 135 - 145 mmol/L 134(L) 134(L) 132(L)  Potassium 3.5 - 5.1 mmol/L 3.2(L) 3.7 3.8  Chloride 98 - 111 mmol/L 97(L) 101  97(L)  CO2 22 - 32 mmol/L - 24 27  Calcium 8.9 - 10.3 mg/dL - 8.4(L) 8.8(L)  Total Protein 6.5 - 8.1 g/dL - - -  Total Bilirubin 0.3 - 1.2 mg/dL - - -  Alkaline Phos 38 - 126 U/L - - -  AST 15 - 41 U/L - - -  ALT 0 - 44 U/L - - -      RADIOGRAPHIC STUDIES: I have personally reviewed the radiological images as listed and agreed with the findings in the report. No results found.   ASSESSMENT & PLAN:  Lisa James is a 84 y.o. female with    1. Idiopathic Thrombocytopenia Purpura (ITP) -This was diagnosed in 2014. She has been treated with Nplate since 02/9372, last injection in 04/23/2018. To make it more convenient on her I switched her to oral Promacta 60m daily in 04/2018. Goal to keep plt >100.  -She has been tolerating well althoutgh she was temporary off due to frequent hospitalization.  -Labs revewied, PLT at 75K, anemia resolved.  Due to her high risk for fall, I will increase her Promacta to 753mdaily. I refilled today  -[id number]. I will contact them about  getting labs in 2, 4 and 8 weeks and send results to me.  -F/u with me in 3 months   2. Decreased appetite  -Has lost weight since frequent hsopitalizations.  -continue nutritional supplement.   3. Hypertension, DM  -She will follow with her primary care physician and cardiologist Dr. KeClaiborne BillingsShe will continue monitoring her blood pressure and blood glucose.  -BP elevated at 192/81 today (07/10/18)  4. Falls -She has had frequent falls lately. Her niece thought it's related to her BP meds which was giving incorrectly when she moved to a memory unit a few months ago. -I have encoruegd her ambulate with walker.   5. Dementia -[facility].  -Managed by PCP, On medicatoin  primary care physician has started her on medication for dementia, but both -Stable  PLAN: -Labs revewied, I will increased Promacta to 7543mI refilled today  -[facility] CBC in 2, 4 and 8 weeks  -Labs and f/u with me in 3 months     No problem-specific Assessment & Plan notes found for this encounter.   No orders of the defined types were placed in this encounter.  All questions were answered. The patient knows to call the clinic with any problems, questions or concerns. No barriers to learning was detected.  I spent 20 minutes counseling the patient face to face. The total time spent in the appointment was 25 minutes and more than 50% was on counseling and review of test results     YanTruitt MerleD 07/10/2018   I, AmoJoslyn Devonm acting as scribe for YanTruitt MerleD.   I have reviewed the above documentation for accuracy and completeness, and I agree with the above.

## 2018-07-12 ENCOUNTER — Observation Stay (HOSPITAL_COMMUNITY)
Admission: EM | Admit: 2018-07-12 | Discharge: 2018-07-17 | Disposition: A | Discharge status: Skilled Nursing Facility | Payer: Medicare Other | Attending: Internal Medicine | Admitting: Internal Medicine

## 2018-07-12 ENCOUNTER — Emergency Department (HOSPITAL_COMMUNITY): Payer: Medicare Other

## 2018-07-12 ENCOUNTER — Encounter (HOSPITAL_COMMUNITY): Payer: Self-pay

## 2018-07-12 ENCOUNTER — Other Ambulatory Visit (INDEPENDENT_AMBULATORY_CARE_PROVIDER_SITE_OTHER): Payer: Self-pay | Admitting: Physician Assistant

## 2018-07-12 DIAGNOSIS — S72001A Fracture of unspecified part of neck of right femur, initial encounter for closed fracture: Secondary | ICD-10-CM

## 2018-07-12 DIAGNOSIS — N390 Urinary tract infection, site not specified: Secondary | ICD-10-CM | POA: Diagnosis present

## 2018-07-12 DIAGNOSIS — E860 Dehydration: Secondary | ICD-10-CM | POA: Insufficient documentation

## 2018-07-12 DIAGNOSIS — S72141A Displaced intertrochanteric fracture of right femur, initial encounter for closed fracture: Secondary | ICD-10-CM | POA: Diagnosis not present

## 2018-07-12 DIAGNOSIS — E785 Hyperlipidemia, unspecified: Secondary | ICD-10-CM | POA: Diagnosis not present

## 2018-07-12 DIAGNOSIS — Z951 Presence of aortocoronary bypass graft: Secondary | ICD-10-CM | POA: Insufficient documentation

## 2018-07-12 DIAGNOSIS — W19XXXA Unspecified fall, initial encounter: Secondary | ICD-10-CM | POA: Diagnosis present

## 2018-07-12 DIAGNOSIS — Y92129 Unspecified place in nursing home as the place of occurrence of the external cause: Secondary | ICD-10-CM | POA: Insufficient documentation

## 2018-07-12 DIAGNOSIS — I2510 Atherosclerotic heart disease of native coronary artery without angina pectoris: Secondary | ICD-10-CM | POA: Diagnosis not present

## 2018-07-12 DIAGNOSIS — F419 Anxiety disorder, unspecified: Secondary | ICD-10-CM | POA: Insufficient documentation

## 2018-07-12 DIAGNOSIS — I6203 Nontraumatic chronic subdural hemorrhage: Secondary | ICD-10-CM | POA: Insufficient documentation

## 2018-07-12 DIAGNOSIS — E876 Hypokalemia: Secondary | ICD-10-CM | POA: Diagnosis not present

## 2018-07-12 DIAGNOSIS — D693 Immune thrombocytopenic purpura: Secondary | ICD-10-CM | POA: Diagnosis present

## 2018-07-12 DIAGNOSIS — Z681 Body mass index (BMI) 19 or less, adult: Secondary | ICD-10-CM | POA: Insufficient documentation

## 2018-07-12 DIAGNOSIS — D500 Iron deficiency anemia secondary to blood loss (chronic): Secondary | ICD-10-CM | POA: Diagnosis not present

## 2018-07-12 DIAGNOSIS — C8590 Non-Hodgkin lymphoma, unspecified, unspecified site: Secondary | ICD-10-CM | POA: Diagnosis present

## 2018-07-12 DIAGNOSIS — E559 Vitamin D deficiency, unspecified: Secondary | ICD-10-CM | POA: Diagnosis not present

## 2018-07-12 DIAGNOSIS — W050XXA Fall from non-moving wheelchair, initial encounter: Secondary | ICD-10-CM | POA: Insufficient documentation

## 2018-07-12 DIAGNOSIS — R4182 Altered mental status, unspecified: Secondary | ICD-10-CM | POA: Diagnosis present

## 2018-07-12 DIAGNOSIS — I10 Essential (primary) hypertension: Secondary | ICD-10-CM | POA: Diagnosis not present

## 2018-07-12 DIAGNOSIS — I110 Hypertensive heart disease with heart failure: Secondary | ICD-10-CM | POA: Diagnosis not present

## 2018-07-12 DIAGNOSIS — E43 Unspecified severe protein-calorie malnutrition: Secondary | ICD-10-CM

## 2018-07-12 DIAGNOSIS — F0390 Unspecified dementia without behavioral disturbance: Secondary | ICD-10-CM | POA: Diagnosis not present

## 2018-07-12 DIAGNOSIS — I5022 Chronic systolic (congestive) heart failure: Secondary | ICD-10-CM | POA: Diagnosis present

## 2018-07-12 DIAGNOSIS — G9341 Metabolic encephalopathy: Secondary | ICD-10-CM | POA: Diagnosis not present

## 2018-07-12 DIAGNOSIS — Z79899 Other long term (current) drug therapy: Secondary | ICD-10-CM | POA: Insufficient documentation

## 2018-07-12 DIAGNOSIS — S065XAA Traumatic subdural hemorrhage with loss of consciousness status unknown, initial encounter: Secondary | ICD-10-CM | POA: Diagnosis present

## 2018-07-12 DIAGNOSIS — R451 Restlessness and agitation: Secondary | ICD-10-CM | POA: Diagnosis not present

## 2018-07-12 DIAGNOSIS — Z8571 Personal history of Hodgkin lymphoma: Secondary | ICD-10-CM | POA: Insufficient documentation

## 2018-07-12 DIAGNOSIS — E1151 Type 2 diabetes mellitus with diabetic peripheral angiopathy without gangrene: Secondary | ICD-10-CM | POA: Insufficient documentation

## 2018-07-12 DIAGNOSIS — R5381 Other malaise: Secondary | ICD-10-CM | POA: Diagnosis not present

## 2018-07-12 DIAGNOSIS — E1122 Type 2 diabetes mellitus with diabetic chronic kidney disease: Secondary | ICD-10-CM | POA: Insufficient documentation

## 2018-07-12 DIAGNOSIS — N3000 Acute cystitis without hematuria: Secondary | ICD-10-CM

## 2018-07-12 DIAGNOSIS — N179 Acute kidney failure, unspecified: Secondary | ICD-10-CM | POA: Insufficient documentation

## 2018-07-12 DIAGNOSIS — K219 Gastro-esophageal reflux disease without esophagitis: Secondary | ICD-10-CM | POA: Diagnosis present

## 2018-07-12 DIAGNOSIS — E119 Type 2 diabetes mellitus without complications: Secondary | ICD-10-CM

## 2018-07-12 DIAGNOSIS — I252 Old myocardial infarction: Secondary | ICD-10-CM | POA: Insufficient documentation

## 2018-07-12 DIAGNOSIS — N183 Chronic kidney disease, stage 3 unspecified: Secondary | ICD-10-CM | POA: Diagnosis present

## 2018-07-12 DIAGNOSIS — R410 Disorientation, unspecified: Secondary | ICD-10-CM | POA: Diagnosis present

## 2018-07-12 DIAGNOSIS — S72144A Nondisplaced intertrochanteric fracture of right femur, initial encounter for closed fracture: Secondary | ICD-10-CM

## 2018-07-12 DIAGNOSIS — Z9181 History of falling: Secondary | ICD-10-CM | POA: Diagnosis not present

## 2018-07-12 DIAGNOSIS — S065X9A Traumatic subdural hemorrhage with loss of consciousness of unspecified duration, initial encounter: Secondary | ICD-10-CM | POA: Diagnosis present

## 2018-07-12 LAB — URINALYSIS, MICROSCOPIC (REFLEX): RBC / HPF: NONE SEEN RBC/hpf (ref 0–5)

## 2018-07-12 LAB — COMPREHENSIVE METABOLIC PANEL
ALBUMIN: 3.2 g/dL — AB (ref 3.5–5.0)
ALT: 13 U/L (ref 0–44)
AST: 22 U/L (ref 15–41)
Alkaline Phosphatase: 62 U/L (ref 38–126)
Anion gap: 12 (ref 5–15)
BUN: 22 mg/dL (ref 8–23)
CHLORIDE: 97 mmol/L — AB (ref 98–111)
CO2: 25 mmol/L (ref 22–32)
Calcium: 8.8 mg/dL — ABNORMAL LOW (ref 8.9–10.3)
Creatinine, Ser: 1.87 mg/dL — ABNORMAL HIGH (ref 0.44–1.00)
GFR calc Af Amer: 27 mL/min — ABNORMAL LOW (ref >60)
GFR calc non Af Amer: 24 mL/min — ABNORMAL LOW (ref >60)
GLUCOSE: 151 mg/dL — AB (ref 70–99)
Potassium: 3.1 mmol/L — ABNORMAL LOW (ref 3.5–5.1)
SODIUM: 134 mmol/L — AB (ref 135–145)
Total Bilirubin: 1.1 mg/dL (ref 0.3–1.2)
Total Protein: 5 g/dL — ABNORMAL LOW (ref 6.5–8.1)

## 2018-07-12 LAB — URINALYSIS, ROUTINE W REFLEX MICROSCOPIC
BILIRUBIN URINE: NEGATIVE
Glucose, UA: NEGATIVE mg/dL
HGB URINE DIPSTICK: NEGATIVE
Ketones, ur: 15 mg/dL — AB
Nitrite: NEGATIVE
PH: 6 (ref 5.0–8.0)
Protein, ur: NEGATIVE mg/dL
SPECIFIC GRAVITY, URINE: 1.015 (ref 1.005–1.030)

## 2018-07-12 LAB — CBC WITH DIFFERENTIAL/PLATELET
ABS IMMATURE GRANULOCYTES: 0.06 10*3/uL (ref 0.00–0.07)
BASOS PCT: 0 %
Basophils Absolute: 0 10*3/uL (ref 0.0–0.1)
Eosinophils Absolute: 0 10*3/uL (ref 0.0–0.5)
Eosinophils Relative: 0 %
HCT: 29.1 % — ABNORMAL LOW (ref 36.0–46.0)
Hemoglobin: 9.7 g/dL — ABNORMAL LOW (ref 12.0–15.0)
IMMATURE GRANULOCYTES: 1 %
Lymphocytes Relative: 16 %
Lymphs Abs: 1.5 10*3/uL (ref 0.7–4.0)
MCH: 33.8 pg (ref 26.0–34.0)
MCHC: 33.3 g/dL (ref 30.0–36.0)
MCV: 101.4 fL — AB (ref 80.0–100.0)
MONOS PCT: 9 %
Monocytes Absolute: 0.8 10*3/uL (ref 0.1–1.0)
NEUTROS ABS: 6.7 10*3/uL (ref 1.7–7.7)
NEUTROS PCT: 74 %
PLATELETS: 108 10*3/uL — AB (ref 150–400)
RBC: 2.87 MIL/uL — AB (ref 3.87–5.11)
RDW: 17.5 % — ABNORMAL HIGH (ref 11.5–15.5)
WBC: 9.1 10*3/uL (ref 4.0–10.5)
nRBC: 0 % (ref 0.0–0.2)

## 2018-07-12 MED ORDER — MORPHINE SULFATE (PF) 2 MG/ML IV SOLN
1.0000 mg | INTRAVENOUS | Status: DC | PRN
  Administered 2018-07-13 – 2018-07-14 (×2): 1 mg via INTRAVENOUS
  Filled 2018-07-12 (×2): qty 1

## 2018-07-12 MED ORDER — HYDROCODONE-ACETAMINOPHEN 5-325 MG PO TABS
1.0000 | ORAL_TABLET | Freq: Four times a day (QID) | ORAL | Status: DC | PRN
  Administered 2018-07-13 – 2018-07-15 (×4): 1 via ORAL
  Filled 2018-07-12 (×5): qty 1

## 2018-07-12 MED ORDER — ENOXAPARIN SODIUM 30 MG/0.3ML ~~LOC~~ SOLN: 30.0000 mg | SUBCUTANEOUS | Status: DC

## 2018-07-12 MED ORDER — SODIUM CHLORIDE 0.9 % IV SOLN
1.0000 g | INTRAVENOUS | Status: DC
  Administered 2018-07-12 – 2018-07-13 (×2): 1 g via INTRAVENOUS
  Filled 2018-07-12 (×3): qty 10

## 2018-07-12 MED ORDER — SODIUM CHLORIDE 0.9 % IV SOLN
INTRAVENOUS | Status: DC
  Administered 2018-07-12 – 2018-07-14 (×3): via INTRAVENOUS

## 2018-07-12 MED ORDER — SERTRALINE HCL 25 MG PO TABS
25.0000 mg | ORAL_TABLET | Freq: Every day | ORAL | Status: DC
  Administered 2018-07-13 – 2018-07-17 (×5): 25 mg via ORAL
  Filled 2018-07-12 (×5): qty 1

## 2018-07-12 MED ORDER — DOCUSATE SODIUM 100 MG PO CAPS
100.0000 mg | ORAL_CAPSULE | Freq: Two times a day (BID) | ORAL | Status: DC
  Administered 2018-07-12 – 2018-07-17 (×8): 100 mg via ORAL
  Filled 2018-07-12 (×10): qty 1

## 2018-07-12 MED ORDER — ACETAMINOPHEN 325 MG PO TABS
650.0000 mg | ORAL_TABLET | Freq: Four times a day (QID) | ORAL | Status: DC | PRN
  Administered 2018-07-13 – 2018-07-17 (×4): 650 mg via ORAL
  Filled 2018-07-12 (×4): qty 2

## 2018-07-12 MED ORDER — CARVEDILOL 6.25 MG PO TABS
6.2500 mg | ORAL_TABLET | Freq: Two times a day (BID) | ORAL | Status: DC
  Administered 2018-07-12 – 2018-07-17 (×10): 6.25 mg via ORAL
  Filled 2018-07-12 (×10): qty 1

## 2018-07-12 MED ORDER — ELTROMBOPAG OLAMINE 50 MG PO TABS
100.0000 mg | ORAL_TABLET | Freq: Every day | ORAL | Status: DC
  Administered 2018-07-13 – 2018-07-17 (×5): 100 mg via ORAL
  Filled 2018-07-12 (×5): qty 2

## 2018-07-12 MED ORDER — POTASSIUM CHLORIDE CRYS ER 20 MEQ PO TBCR
40.0000 meq | EXTENDED_RELEASE_TABLET | Freq: Once | ORAL | Status: AC
  Administered 2018-07-12: 40 meq via ORAL
  Filled 2018-07-12: qty 2

## 2018-07-12 MED ORDER — ACETAMINOPHEN 650 MG RE SUPP: 650.0000 mg | Freq: Four times a day (QID) | RECTAL | Status: DC | PRN

## 2018-07-12 NOTE — ED Notes (Signed)
Large bruise noted at right hip with age undetermined but portion appear to fade.

## 2018-07-12 NOTE — H&P (Signed)
History and Physical    CHAIRTY TOMAN VZD:[id number] DOB: [birth year] DOA: 07/12/2018  PCP: Merrilee Seashore, MD  Patient coming from:  Memory care unit.   I have personally briefly reviewed patient's old medical records in [place]  Chief Complaint: Fall at memory care unit  HPI: Lisa James is a 84 y.o. female with medical history significant of dementia, type 2 diabetes mellitus, hypertension, chronic systolic heart failure, history of coronary artery disease, non-Hodgkin's lymphoma, history of chronic subdural hematoma, chronic thrombocytopenia, was brought to the hospital after a fall from wheelchair.  Patient is confused and could not give me any history on questioning her she said she does not know how she fell or if she fell.  She denies any pain on the right hip or anywhere.  She is able to move her right lower extremity without any problem.  Called her niece over the phone and then he is reported that patient is in memory care unit and she was in wheelchair and she was trying to get up and fell on the right side.  No loss of consciousness.  ED Course: On arrival to ED she was afebrile lab work was significant for sodium of 134 potassium of 3.1 creatinine of 1.87 albumin of 3.2 hemoglobin of 9.7, platelets of 295[address].  Urinalysis was abnormal showing trace leukocytes and few bacteria.  Urine cultures were sent. CT head without contrast and cervical spine shows chronic subdural hematoma on the left which is unchanged and has a 6 mm midline shift.  X-ray of the right shoulder shows Postsurgical changes of ORIF proximal RIGHT humerus.  X-rays of the hip show Osseous demineralization with a probable nondisplaced intertrochanteric fracture of the RIGHT femur.   ED consulted [facility] who recommended getting CT hip without contrast.  Review of Systems: cannot be obtained as she has dementia and is confused.   Past Medical History:  Diagnosis Date  . Acute  MI, lateral wall ([place])   . Aortic stenosis   . Chronic kidney disease   . Chronic systolic heart failure ([place])   . Coronary artery disease    sees Dr Claiborne Billings every 6 months  . Dementia ([place])   . Diabetes ([place])   . DM (diabetes mellitus) ([place])    type 2 ; diet controlled  . Heart disease   . HTN (hypertension)   . 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 Ischemic heart disease   . Kidney disease   . Mitral regurgitation   . Non Hodgkin's lymphoma ([place])    of the throat  . Subdural hematoma, post-traumatic ([place]) 2015   sees Dr Sherwood Gambler every 6 months  . Thrombocytopenia ([place])    sees Dr Marin Olp every 2 weeks ;receives Nplate prn    Past Surgical History:  Procedure Laterality Date  . BONE MARROW BIOPSY  11/22/2002   left posterior iliac crest bone marrow biopsy and aspirate  . CATARACT EXTRACTION W/ INTRAOCULAR LENS  IMPLANT, BILATERAL Bilateral   . CORONARY ARTERY BYPASS GRAFT  11/20/2007   x5  . EYE SURGERY    . ORIF HUMERUS FRACTURE Right 05/28/2018   Procedure: RIGHT OPEN REDUCTION INTERNAL FIXATION (ORIF) PROXIMAL HUMERUS FRACTURE;  Surgeon: Meredith Pel, MD;  Location: [facility];  Service: Orthopedics;  Laterality: Right;  . submental lymph node excisional biopsy  10/22/2002     reports that she has never smoked. She has never used smokeless tobacco. She reports that she does not drink alcohol or use  drugs.  Allergies  Allergen Reactions  . Ace Inhibitors Cough    Family History  Problem Relation Age of Onset  . Heart attack Father   . Diabetes Father    Cannot be obtained or reviewed as pt has dementia.   Prior to Admission medications   Medication Sig Start Date End Date Taking? Authorizing Provider  atorvastatin (LIPITOR) 20 MG tablet TAKE 1 TABLET (20 MG TOTAL) BY MOUTH DAILY. Patient taking differently: Take 20 mg by mouth at bedtime.  01/29/16  Yes Darlyne Russian, MD  Calcium Carbonate-Vitamin D (CALCIUM-VITAMIN D) 500-200 MG-UNIT per tablet Take 1  tablet by mouth daily.    Yes [provider]  carvedilol (COREG) 6.25 MG tablet Take 1 tablet (6.25 mg total) by mouth 2 (two) times daily with a meal. 07/28/15  Yes Daub, Loura Back, MD  Cholecalciferol (VITAMIN D) 1000 UNITS capsule Take 1,000 Units by mouth at bedtime.    Yes [provider]  docusate sodium (COLACE) 100 MG capsule Take 1 capsule (100 mg total) by mouth 2 (two) times daily. 06/01/18  Yes Meredith Pel, MD  eltrombopag (PROMACTA) 50 MG tablet Take 100 mg by mouth daily. Take on an empty stomach 1 hour before a meal or 2 hours after   Yes [provider]  HYDROcodone-acetaminophen (NORCO/VICODIN) 5-325 MG tablet Take 1 tablet by mouth every 6 (six) hours as needed (for pain). 06/07/18  Yes Mariel Aloe, MD  LACTOBACILLUS PO Take 2 capsules by mouth 3 (three) times daily.   Yes [provider]  methocarbamol (ROBAXIN) 500 MG tablet Take 1 tablet (500 mg total) by mouth every 6 (six) hours as needed for muscle spasms. 05/29/18  Yes Meredith Pel, MD  Multiple Vitamins-Minerals (PRESERVISION AREDS PO) Take 1 tablet by mouth daily.   Yes [provider]  sertraline (ZOLOFT) 25 MG tablet Take 25 mg by mouth daily.   Yes [provider]  spironolactone (ALDACTONE) 25 MG tablet Take 25 mg by mouth daily.   Yes [provider]  talc (ZEASORB) powder Apply 1 application topically every 12 (twelve) hours as needed (UNDER BREASTS, for irritation).   Yes [provider]  eltrombopag (PROMACTA) 75 MG tablet Take 1 tablet (75 mg total) by mouth daily. Take on an empty stomach 1 hour before meals or 2 hours after. Patient not taking: Reported on 07/12/2018 07/10/18   Truitt Merle, MD  OVER THE COUNTER MEDICATION Aquacel Dressing: "Maintain to right shoulder until ortho follow-up" 06/01/18   [provider]  traZODone (DESYREL) 50 MG tablet Take 50-100 mg by mouth at bedtime as needed for sleep (for 14 days-  until 06/15/18).  06/01/18 07/12/18  [provider]    Physical Exam: Vitals:   07/12/18 1630 07/12/18 1645 07/12/18 1700 07/12/18 1802  BP: (!) 140/56 (!) 149/54 (!) 139/44 (!) 144/46  Pulse: 67 63 (!) 57 (!) 56  Resp: 16     Temp:    (!) 97.5 F (36.4 C)  TempSrc:    Oral  SpO2: 99% 100% 100% 100%    Constitutional: NAD, calm, comfortable Vitals:   07/12/18 1630 07/12/18 1645 07/12/18 1700 07/12/18 1802  BP: (!) 140/56 (!) 149/54 (!) 139/44 (!) 144/46  Pulse: 67 63 (!) 57 (!) 56  Resp: 16     Temp:    (!) 97.5 F (36.4 C)  TempSrc:    Oral  SpO2: 99% 100% 100% 100%   Eyes: PERRL, lids and conjunctivae  normal ENMT: Mucous membranes are dry  Respiratory: clear to auscultation bilaterally, no wheezing, no crackles. Normal respiratory effort. No accessory muscle use.  Cardiovascular: Regular rate and rhythm, no murmurs / rubs / gallops. No extremity edema.   .Abdomen: no tenderness, no masses palpated. abd soft  Musculoskeletal: no clubbing / cyanosis.  no contractures. Normal muscle tone.  Skin: no rashes, lesions, ulcers. No induration Neurologic: alert and confused , able to move all extremities.  Psychiatric: normal mood.     Labs on Admission: I have personally reviewed following labs and imaging studies  CBC: Recent Labs  Lab 07/10/18 1408 07/12/18 1347  WBC 6.4 9.1  NEUTROABS 4.2 6.7  HGB 12.2 9.7*  HCT 35.7* 29.1*  MCV 100.3* 101.4*  PLT 75* 108*   Basic Metabolic Panel: Recent Labs  Lab 07/12/18 1347  NA 134*  K 3.1*  CL 97*  CO2 25  GLUCOSE 151*  BUN 22  CREATININE 1.87*  CALCIUM 8.8*   GFR: Estimated Creatinine Clearance: 13.4 mL/min (A) (by C-G formula based on SCr of 1.87 mg/dL (H)). Liver Function Tests: Recent Labs  Lab 07/12/18 1347  AST 22  ALT 13  ALKPHOS 62  BILITOT 1.1  PROT 5.0*  ALBUMIN 3.2*   No results for input(s): LIPASE, AMYLASE in the last 168 hours. No results for input(s): AMMONIA in the last 168  hours. Coagulation Profile: No results for input(s): INR, PROTIME in the last 168 hours. Cardiac Enzymes: No results for input(s): CKTOTAL, CKMB, CKMBINDEX, TROPONINI in the last 168 hours. BNP (last 3 results) No results for input(s): PROBNP in the last 8760 hours. HbA1C: No results for input(s): HGBA1C in the last 72 hours. CBG: No results for input(s): GLUCAP in the last 168 hours. Lipid Profile: No results for input(s): CHOL, HDL, LDLCALC, TRIG, CHOLHDL, LDLDIRECT in the last 72 hours. Thyroid Function Tests: No results for input(s): TSH, T4TOTAL, FREET4, T3FREE, THYROIDAB in the last 72 hours. Anemia Panel: No results for input(s): VITAMINB12, FOLATE, FERRITIN, TIBC, IRON, RETICCTPCT in the last 72 hours. Urine analysis:    Component Value Date/Time   COLORURINE YELLOW 07/12/2018 1542   APPEARANCEUR CLOUDY (A) 07/12/2018 1542   APPEARANCEUR Hazy 11/19/2012 1750   LABSPEC 1.015 07/12/2018 1542   LABSPEC 1.014 11/19/2012 1750   PHURINE 6.0 07/12/2018 1542   GLUCOSEU NEGATIVE 07/12/2018 1542   GLUCOSEU Negative 11/19/2012 1750   HGBUR NEGATIVE 07/12/2018 1542   BILIRUBINUR NEGATIVE 07/12/2018 1542   BILIRUBINUR negative 05/12/2015 1823   BILIRUBINUR Negative 11/19/2012 1750   KETONESUR 15 (A) 07/12/2018 1542   PROTEINUR NEGATIVE 07/12/2018 1542   UROBILINOGEN 0.2 05/13/2015 0504   NITRITE NEGATIVE 07/12/2018 1542   LEUKOCYTESUR TRACE (A) 07/12/2018 1542   LEUKOCYTESUR 2+ 11/19/2012 1750    Radiological Exams on Admission: Dg Shoulder Right  Result Date: 07/12/2018 CLINICAL DATA:  Fell twice today EXAM: RIGHT SHOULDER - 2+ VIEW COMPARISON:  05/28/2018 FINDINGS: Osseous demineralization. AC joint alignment normal. Prior ORIF proximal RIGHT humerus. Again identified small displaced bone fragments inferior to the glenohumeral joint. Hardware appears intact. No additional fracture, dislocation or bone destruction. Visualized RIGHT ribs intact. IMPRESSION: Postsurgical changes  of ORIF proximal RIGHT humerus. Osseous demineralization without new abnormalities. Electronically Signed   By: Mark  Boles M.D.   On: 07/12/2018 13:36   Ct Head Wo Contrast  Result Date: 07/12/2018 CLINICAL DATA:  Fall.  Head injury. EXAM: CT HEAD WITHOUT CONTRAST CT CERVICAL SPINE WITHOUT CONTRAST TECHNIQUE: Multidetector CT imaging of the head and   cervical spine was performed following the standard protocol without intravenous contrast. Multiplanar CT image reconstructions of the cervical spine were also generated. 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 This measures approximately 23 mm in thickness. Mass-effect on the left cerebral hemisphere with 6 mm midline shift to the right, unchanged. Generalized atrophy.  Negative for hydrocephalus.  No acute infarct. Vascular: Calcified distal vertebral arteries and cavernous carotid bilaterally. Negative for hyperdense vessel Skull: Negative Sinuses/Orbits: Paranasal sinuses clear bilaterally. Bilateral cataract surgery Other: None CT CERVICAL SPINE FINDINGS Alignment: 3 mm anterolisthesis C4-5. Mild anterolisthesis C7-T1, T1-2, T2-3 Skull base and vertebrae: Negative for fracture Soft tissues and spinal canal: Extensive atherosclerotic calcification. No soft tissue mass in the neck. Disc levels: Multilevel disc and facet degeneration throughout the cervical spine. Mild spinal stenosis at C3-4. Mild foraminal narrowing at multiple levels due to spurring. Upper chest: Lung apices clear bilaterally. Other: None IMPRESSION: 1. No acute intracranial abnormality 2. 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 Negative for cervical spine fracture. Multilevel degenerative changes spurring. Electronically Signed   By: Charles  Clark M.D.   On: 07/12/2018 13:16   Ct  Cervical Spine Wo Contrast  Result Date: 07/12/2018 CLINICAL DATA:  Fall.  Head injury. EXAM: CT HEAD WITHOUT CONTRAST CT CERVICAL SPINE WITHOUT CONTRAST TECHNIQUE: Multidetector CT imaging of the head and cervical spine was performed following the standard protocol without intravenous contrast. Multiplanar CT image reconstructions of the cervical spine were also generated. 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 This measures approximately 23 mm in thickness. Mass-effect on the left cerebral hemisphere with 6 mm midline shift to the right, unchanged. Generalized atrophy.  Negative for hydrocephalus.  No acute infarct. Vascular: Calcified distal vertebral arteries and cavernous carotid bilaterally. Negative for hyperdense vessel Skull: Negative Sinuses/Orbits: Paranasal sinuses clear bilaterally. Bilateral cataract surgery Other: None CT CERVICAL SPINE FINDINGS Alignment: 3 mm anterolisthesis C4-5. Mild anterolisthesis C7-T1, T1-2, T2-3 Skull base and vertebrae: Negative for fracture Soft tissues and spinal canal: Extensive atherosclerotic calcification. No soft tissue mass in the neck. Disc levels: Multilevel disc and facet degeneration throughout the cervical spine. Mild spinal stenosis at C3-4. Mild foraminal narrowing at multiple levels due to spurring. Upper chest: Lung apices clear bilaterally. Other: None IMPRESSION: 1. No acute intracranial abnormality 2. 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 Negative for cervical spine fracture. Multilevel degenerative changes spurring. Electronically Signed   By: Charles  Clark M.D.   On: 07/12/2018 13:16   Dg Hip Unilat W Or Wo Pelvis 2-3 Views Right  Result Date: 07/12/2018 CLINICAL DATA:  Fell twice today, RIGHT hip pain EXAM: DG HIP (WITH OR WITHOUT  PELVIS) 2-3V RIGHT COMPARISON:  None FINDINGS: Osseous demineralization. Hip and SI joint spaces preserved. Probable nondisplaced intertrochanteric fracture RIGHT femur. No additional fracture or dislocation. Few pelvic phleboliths and scattered atherosclerotic calcifications. Soft tissue swelling laterally overlying the lateral RIGHT hip region. IMPRESSION: Osseous demineralization with a probable nondisplaced intertrochanteric fracture of the RIGHT femur. Electronically Signed   By: Mark  Boles M.D.   On: 07/12/2018 13:40    EKG: Independently reviewed. Sinus rhythm, borderline prolonged PT interval.   Assessment/Plan   Active Problems:   Closed right hip fracture, initial encounter ([place])   Questionable fall with closed right hip fracture No syncope noted. CT of the right hip without contrast ordered and is pending. Pain control.  Orthopedics consulted and recommendations given.  Will wait for the CT of the right hip.   Abnormal UA acute encephalopathy probable urinary tract infection Rocephin started and urine culture still pending.   Hypertension Blood pressure parameters well controlled, resume Coreg.   Non-Hodgkin's lymphoma Patient is on Promacta.  Continue the same.      Type 2 diabetes mellitus Diet controlled.  AKI probably secondary to dehydration.  Baseline creatinine between 1 and 1.2 Currently creatinine is at 1.87.  Urine analysis done.  We will gently hydrate her overnight and repeat creatinine in the morning if no improvement will get ultrasound kidney to rule out obstruction.   Hypokalemia Replace as needed.  Acute metabolic encephalopathy Probably secondary to AKI and dehydration Patient has baseline dementia and she appears to be more confused than baseline as per the niece.  We will get a sitter at bedside as she is trying to get out of bed and is a danger to herself due to high risk of falls.   Chronic thrombocytopenia Follows up as outpatient.   Platelet counts are 10800 and stable.   Chronic systolic heart failure She appears to be slightly dehydrated.    DVT prophylaxis: SCDs Code Status: Full code Family Communication: Gust with niece over the phone Disposition Plan: Pending CT hip Consults called: Orthopedics consulted Admission status: Inpatient/telemetry   Hosie Poisson MD [facility] Pager [phone] If 7PM-7AM, please contact night-coverage [email or web address] Password [facility]  07/12/2018, 6:30 PM

## 2018-07-12 NOTE — Progress Notes (Signed)
Consult Note   Patient: Lisa James             Date of Birth: 02-11-1928           MRN: [id number]             Visit Date: 07/12/2018  Requested by: No referring provider defined for this encounter. PCP: Merrilee Seashore, MD   Assessment & Plan: Visit Diagnoses:  1. Closed fracture of neck of right femur, initial encounter [facility])     Plan:Ct scan to evaluate right hip fracture for definitive treatment planning.  No surgical intervention today. Patient to be admitted to medicine for medical work-up and optimization. Orders: Orders Placed This Encounter  Procedures  . CT HIP RIGHT WO CONTRAST   No orders of the defined types were placed in this encounter.    Procedures: No procedures performed   Clinical Data: No additional findings.   Subjective:     HPI Ms. total is an 84 year old female who had a mechanical fall today that was unwitnessed.  She denies any loss of consciousness dizziness chest pain.  She is unsure why she fell.  She did hit her head.  She is had multiple falls recently and was to stop discharge from the hospital for right shoulder fracture open reduction internal fixation.  She has had some right hip pain since the fall.  She brought to the ER radiographs were obtained and show a possible nondisplaced intertrochanteric fracture right femur.  Family member at bedside states that she is been weak due to her anemia and also decreased appetite since last discharge from the hospital. Review of Systems  Cardiovascular: Negative for chest pain.  Neurological: Negative for dizziness and syncope.  Please see HPI   Objective: Vital Signs: LMP  (LMP Unknown)   Physical Exam Constitutional:      General: She is not in acute distress.    Appearance: Normal appearance. She is not ill-appearing or diaphoretic.  HENT:     Head: Normocephalic.     Comments: Ecchymosis around the right eye and lower lip. Cardiovascular:     Pulses: Normal pulses.    Pulmonary:     Effort: Pulmonary effort is normal.  Neurological:     Mental Status: She is alert.     Comments: Patient seems appropriate and answers questions appropriately.  Psychiatric:        Mood and Affect: Mood normal.        Behavior: Behavior normal.      Ortho Exam  Upper extremities without gross deformities.  Nontender.  Good range of motion of wrist hands elbows without pain.  Right shoulder no attempts of range of motion well-healed surgical incision over the anterior aspect shoulder. Lower extremities good range of motion of both hips without pain.  Nontender left hip.  Right hip tenderness over the anterior aspect of the hip and also the lateral aspect she has a large area of ecchymosis over the lateral right hip extending from just below the greater trochanter down to mid thigh.  Nontender over the lower legs bilaterally calves supple nontender.  No deformities of the feet or ankles able to dorsiflex plantarflex both ankles without pain.  Able to wiggle toes.  Sensation grossly intact bilateral feet light touch.   Specialty Comments:  No specialty comments available.  Imaging:  Dg Shoulder Right  Result Date: 07/12/2018 CLINICAL DATA:  [place] twice today EXAM: RIGHT SHOULDER - 2+ VIEW COMPARISON:  05/28/2018 FINDINGS: Osseous demineralization.  AC joint alignment normal. Prior ORIF proximal RIGHT humerus. Again identified small displaced bone fragments inferior to the glenohumeral joint. Hardware appears intact. No additional fracture, dislocation or bone destruction. Visualized RIGHT ribs intact. IMPRESSION: Postsurgical changes of ORIF proximal RIGHT humerus. Osseous demineralization without new abnormalities. Electronically Signed   By: Lavonia Dana M.D.   On: 07/12/2018 13:36   Ct Head Wo Contrast  Result Date: 07/12/2018 CLINICAL DATA:  Fall.  Head injury. EXAM: CT HEAD WITHOUT CONTRAST CT CERVICAL SPINE WITHOUT CONTRAST TECHNIQUE: Multidetector CT imaging of the head  and cervical spine was performed following the standard protocol without intravenous contrast. Multiplanar CT image reconstructions of the cervical spine were also generated. 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 This measures approximately 23 mm in thickness. Mass-effect on the left cerebral hemisphere with 6 mm midline shift to the right, unchanged. Generalized atrophy.  Negative for hydrocephalus.  No acute infarct. Vascular: Calcified distal vertebral arteries and cavernous carotid bilaterally. Negative for hyperdense vessel Skull: Negative Sinuses/Orbits: Paranasal sinuses clear bilaterally. Bilateral cataract surgery Other: None CT CERVICAL SPINE FINDINGS Alignment: 3 mm anterolisthesis C4-5. Mild anterolisthesis C7-T1, T1-2, T2-3 Skull base and vertebrae: Negative for fracture Soft tissues and spinal canal: Extensive atherosclerotic calcification. No soft tissue mass in the neck. Disc levels: Multilevel disc and facet degeneration throughout the cervical spine. Mild spinal stenosis at C3-4. Mild foraminal narrowing at multiple levels due to spurring. Upper chest: Lung apices clear bilaterally. Other: None IMPRESSION: 1. No acute intracranial abnormality 2. 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 Negative for cervical spine fracture. Multilevel degenerative changes spurring. Electronically Signed   By: Franchot Gallo M.D.   On: 07/12/2018 13:16   Ct Cervical Spine Wo Contrast  Result Date: 07/12/2018 CLINICAL DATA:  Fall.  Head injury. EXAM: CT HEAD WITHOUT CONTRAST CT CERVICAL SPINE WITHOUT CONTRAST TECHNIQUE: Multidetector CT imaging of the head and cervical spine was performed following the standard protocol without intravenous contrast. Multiplanar CT image reconstructions of the cervical  spine were also generated. 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 This measures approximately 23 mm in thickness. Mass-effect on the left cerebral hemisphere with 6 mm midline shift to the right, unchanged. Generalized atrophy.  Negative for hydrocephalus.  No acute infarct. Vascular: Calcified distal vertebral arteries and cavernous carotid bilaterally. Negative for hyperdense vessel Skull: Negative Sinuses/Orbits: Paranasal sinuses clear bilaterally. Bilateral cataract surgery Other: None CT CERVICAL SPINE FINDINGS Alignment: 3 mm anterolisthesis C4-5. Mild anterolisthesis C7-T1, T1-2, T2-3 Skull base and vertebrae: Negative for fracture Soft tissues and spinal canal: Extensive atherosclerotic calcification. No soft tissue mass in the neck. Disc levels: Multilevel disc and facet degeneration throughout the cervical spine. Mild spinal stenosis at C3-4. Mild foraminal narrowing at multiple levels due to spurring. Upper chest: Lung apices clear bilaterally. Other: None IMPRESSION: 1. No acute intracranial abnormality 2. 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 Negative for cervical spine fracture. Multilevel degenerative changes spurring. Electronically Signed   By: Franchot Gallo M.D.   On: 07/12/2018 13:16   Dg Hip Unilat W Or Wo Pelvis 2-3 Views Right  Result Date: 07/12/2018 CLINICAL DATA:  [place]  twice today, RIGHT hip pain EXAM: DG HIP (WITH OR WITHOUT PELVIS) 2-3V RIGHT COMPARISON:  None FINDINGS: Osseous demineralization. Hip and SI joint spaces preserved. Probable nondisplaced intertrochanteric fracture RIGHT femur. No additional fracture or dislocation. Few pelvic phleboliths and scattered atherosclerotic calcifications. Soft tissue swelling laterally overlying the lateral RIGHT hip region.  IMPRESSION: Osseous demineralization with a probable nondisplaced intertrochanteric fracture of the RIGHT femur. Electronically Signed   By: Lavonia Dana M.D.   On: 07/12/2018 13:40     PMFS History: Patient Active Problem List   Diagnosis Date Noted  . Acute on chronic anemia 06/05/2018  . Chronic diastolic CHF (congestive heart failure) ([place]) 06/05/2018  . Closed fracture of right proximal humerus 05/30/2018  . Proximal humerus fracture 05/28/2018  . Pressure injury of skin 05/15/2018  . Fall 05/14/2018  . Closed comminuted right humeral fracture 05/14/2018  . UTI (urinary tract infection) 05/14/2018  . Hyponatremia 05/14/2018  . Leucocytosis 05/14/2018  . Altered mental status 05/13/2015  . Idiopathic thrombocytopenic purpura ([place]) 07/06/2013  . Aortic valve stenosis 06/19/2013  . Immune thrombocytopenic purpura ([place]) 01/21/2013  . GERD (gastroesophageal reflux disease) 12/17/2012  . Type II or unspecified type diabetes mellitus with renal manifestations, not stated as uncontrolled(250.40) 12/10/2012  . Chronic kidney disease (CKD), stage III (moderate) ([place]) 11/22/2012  . Diabetes mellitus ([place]) 07/03/2011  . Depression 07/03/2011  . Non-Hodgkin's lymphoma ([place]) 07/03/2011  . CAD (coronary artery disease) 07/03/2011  . PAD (peripheral artery disease) ([place]) 07/03/2011  . Vitamin D deficiency 07/03/2011  . Subdural hematoma ([place]) 07/03/2011  . Confusion 05/12/2011  . Subdural hematoma, acute ([place]) 05/12/2011  . HYPERLIPIDEMIA TYPE IIB / III 05/10/2009  . OLD MYOCARDIAL INFARCTION 05/10/2009  . Occlusion and stenosis of carotid artery without mention of cerebral infarction 05/10/2009  . HYPERTENSION, BENIGN 11/03/2008  . MITRAL REGURGITATION 10/29/2008  . ISCHEMIC HEART DISEASE 10/29/2008  . SYSTOLIC HEART FAILURE, CHRONIC 10/29/2008   Past Medical History:  Diagnosis Date  . Acute MI, lateral wall ([place])   . Aortic stenosis   . Chronic kidney disease   . Chronic systolic  heart failure ([place])   . Coronary artery disease    sees Dr Claiborne Billings every 6 months  . Dementia ([place])   . Diabetes ([place])   . DM (diabetes mellitus) ([place])    type 2 ; diet controlled  . Heart disease   . HTN (hypertension)   . Immune thrombocytopenic purpura ([place]) 01/21/2013  . Ischemic heart disease   . Kidney disease   . Mitral regurgitation   . Non Hodgkin's lymphoma ([place])    of the throat  . Subdural hematoma, post-traumatic ([place]) 2015   sees Dr Sherwood Gambler every 6 months  . Thrombocytopenia ([place])    sees Dr Marin Olp every 2 weeks ;receives Nplate prn    Family History  Problem Relation Age of Onset  . Heart attack Father   . Diabetes Father    Past Surgical History:  Procedure Laterality Date  . BONE MARROW BIOPSY  11/22/2002   left posterior iliac crest bone marrow biopsy and aspirate  . CATARACT EXTRACTION W/ INTRAOCULAR LENS  IMPLANT, BILATERAL Bilateral   . CORONARY ARTERY BYPASS GRAFT  11/20/2007   x5  . EYE SURGERY    . ORIF HUMERUS FRACTURE Right 05/28/2018   Procedure: RIGHT OPEN REDUCTION INTERNAL FIXATION (ORIF) PROXIMAL HUMERUS FRACTURE;  Surgeon: Meredith Pel, MD;  Location: [facility];  Service: Orthopedics;  Laterality: Right;  . submental lymph node excisional biopsy  10/22/2002   Social History   Occupational History    Comment: retired  Tobacco Use  . Smoking status: Never Smoker  . Smokeless tobacco: Never Used  . Tobacco comment: never used tobacco.  Substance and Sexual Activity  . Alcohol use: No    Alcohol/week: 0.0 standard drinks  . Drug use: No  . Sexual activity: Never    Birth control/protection: Abstinence

## 2018-07-12 NOTE — ED Provider Notes (Signed)
[facility] EMERGENCY DEPARTMENT Provider Note   CSN: [id number] Arrival date & time: 07/12/18  1216     History   Chief Complaint Chief Complaint  Patient presents with  . Fall    HPI Lisa James is a 84 y.o. female.  HPI   84 year old female with a history below presents with concern for fall at her skilled nursing facility.  Facility also reports that patient has not been eating much, and they did not think it secondary to patient being upset that she is at the facility.  Patient does not remember how the fall occurred.  Reports she had one fall earlier today, and this afternoon she fell out of the chair.  Reports rather than leaving, and she fell, injuring her right shoulder, and right hip.  Patient denies any other acute concerns today.  Denies numbness, weakness, nausea, vomiting  Past Medical History:  Diagnosis Date  . Acute MI, lateral wall ([place])   . Aortic stenosis   . Chronic kidney disease   . Chronic systolic heart failure ([place])   . Coronary artery disease    sees Dr Claiborne Billings every 6 months  . Dementia ([place])   . Diabetes ([place])   . DM (diabetes mellitus) ([place])    type 2 ; diet controlled  . Heart disease   . HTN (hypertension)   . 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 Ischemic heart disease   . Kidney disease   . Mitral regurgitation   . Non Hodgkin's lymphoma ([place])    of the throat  . Subdural hematoma, post-traumatic ([place]) 2015   sees Dr Sherwood Gambler every 6 months  . Thrombocytopenia ([place])    sees Dr Marin Olp every 2 weeks ;receives Nplate prn    Patient Active Problem List   Diagnosis Date Noted  . Closed right hip fracture, initial encounter ([place]) 07/12/2018  . Acute on chronic anemia 06/05/2018  . Chronic diastolic CHF (congestive heart failure) ([place]) 06/05/2018  . Closed fracture of right proximal humerus 05/30/2018  . Proximal humerus fracture 05/28/2018  . Pressure injury of skin 05/15/2018  . Fall 05/14/2018    . Closed comminuted right humeral fracture 05/14/2018  . UTI (urinary tract infection) 05/14/2018  . Hyponatremia 05/14/2018  . Leucocytosis 05/14/2018  . Altered mental status 05/13/2015  . Idiopathic thrombocytopenic purpura ([place]) 07/06/2013  . Aortic valve stenosis 06/19/2013  . Immune thrombocytopenic purpura ([place]) 01/21/2013  . GERD (gastroesophageal reflux disease) 12/17/2012  . Type II or unspecified type diabetes mellitus with renal manifestations, not stated as uncontrolled(250.40) 12/10/2012  . Chronic kidney disease (CKD), stage III (moderate) ([place]) 11/22/2012  . Diabetes mellitus ([place]) 07/03/2011  . Depression 07/03/2011  . Non-Hodgkin's lymphoma ([place]) 07/03/2011  . CAD (coronary artery disease) 07/03/2011  . PAD (peripheral artery disease) ([place]) 07/03/2011  . Vitamin D deficiency 07/03/2011  . Subdural hematoma ([place]) 07/03/2011  . Confusion 05/12/2011  . Subdural hematoma, acute ([place]) 05/12/2011  . HYPERLIPIDEMIA TYPE IIB / III 05/10/2009  . OLD MYOCARDIAL INFARCTION 05/10/2009  . Occlusion and stenosis of carotid artery without mention of cerebral infarction 05/10/2009  . HYPERTENSION, BENIGN 11/03/2008  . MITRAL REGURGITATION 10/29/2008  . ISCHEMIC HEART DISEASE 10/29/2008  . SYSTOLIC HEART FAILURE, CHRONIC 10/29/2008    Past Surgical History:  Procedure Laterality Date  . BONE MARROW BIOPSY  11/22/2002   left posterior iliac crest bone marrow biopsy and aspirate  . CATARACT EXTRACTION W/ INTRAOCULAR LENS  IMPLANT, BILATERAL Bilateral   . CORONARY ARTERY  BYPASS GRAFT  11/20/2007   x5  . EYE SURGERY    . ORIF HUMERUS FRACTURE Right 05/28/2018   Procedure: RIGHT OPEN REDUCTION INTERNAL FIXATION (ORIF) PROXIMAL HUMERUS FRACTURE;  Surgeon: Meredith Pel, MD;  Location: [facility];  Service: Orthopedics;  Laterality: Right;  . submental lymph node excisional biopsy  10/22/2002     OB History   No obstetric history on file.      Home Medications    Prior to  Admission medications   Medication Sig Start Date End Date Taking? Authorizing Provider  atorvastatin (LIPITOR) 20 MG tablet TAKE 1 TABLET (20 MG TOTAL) BY MOUTH DAILY. Patient taking differently: Take 20 mg by mouth at bedtime.  01/29/16  Yes Darlyne Russian, MD  Calcium Carbonate-Vitamin D (CALCIUM-VITAMIN D) 500-200 MG-UNIT per tablet Take 1 tablet by mouth daily.    Yes [provider]  carvedilol (COREG) 6.25 MG tablet Take 1 tablet (6.25 mg total) by mouth 2 (two) times daily with a meal. 07/28/15  Yes Daub, Loura Back, MD  Cholecalciferol (VITAMIN D) 1000 UNITS capsule Take 1,000 Units by mouth at bedtime.    Yes [provider]  docusate sodium (COLACE) 100 MG capsule Take 1 capsule (100 mg total) by mouth 2 (two) times daily. 06/01/18  Yes Meredith Pel, MD  eltrombopag (PROMACTA) 50 MG tablet Take 100 mg by mouth daily. Take on an empty stomach 1 hour before a meal or 2 hours after   Yes [provider]  HYDROcodone-acetaminophen (NORCO/VICODIN) 5-325 MG tablet Take 1 tablet by mouth every 6 (six) hours as needed (for pain). 06/07/18  Yes Mariel Aloe, MD  LACTOBACILLUS PO Take 2 capsules by mouth 3 (three) times daily.   Yes [provider]  methocarbamol (ROBAXIN) 500 MG tablet Take 1 tablet (500 mg total) by mouth every 6 (six) hours as needed for muscle spasms. 05/29/18  Yes Meredith Pel, MD  Multiple Vitamins-Minerals (PRESERVISION AREDS PO) Take 1 tablet by mouth daily.   Yes [provider]  sertraline (ZOLOFT) 25 MG tablet Take 25 mg by mouth daily.   Yes [provider]  spironolactone (ALDACTONE) 25 MG tablet Take 25 mg by mouth daily.   Yes [provider]  talc (ZEASORB) powder Apply 1 application topically every 12 (twelve) hours as needed (UNDER BREASTS, for irritation).   Yes [provider]  eltrombopag (PROMACTA) 75 MG tablet Take 1 tablet (75 mg total) by mouth daily. Take on an empty stomach  1 hour before meals or 2 hours after. Patient not taking: Reported on 07/12/2018 07/10/18   Truitt Merle, MD  OVER THE COUNTER MEDICATION Aquacel Dressing: "Maintain to right shoulder until ortho follow-up" 06/01/18   [provider]  traZODone (DESYREL) 50 MG tablet Take 50-100 mg by mouth at bedtime as needed for sleep (for 14 days- until 06/15/18).  06/01/18 07/12/18  [provider]    Family History Family History  Problem Relation Age of Onset  . Heart attack Father   . Diabetes Father     Social History Social History   Tobacco Use  . Smoking status: Never Smoker  . Smokeless tobacco: Never Used  . Tobacco comment: never used tobacco.  Substance Use Topics  . Alcohol use: No    Alcohol/week: 0.0 standard drinks  . Drug use: No     Allergies   Ace inhibitors   Review of Systems Review of Systems  Constitutional: Negative for fever.  HENT:  Negative for sore throat.   Eyes: Negative for visual disturbance.  Respiratory: Negative for cough and shortness of breath.   Cardiovascular: Negative for chest pain.  Gastrointestinal: Negative for abdominal pain.  Genitourinary: Negative for difficulty urinating.  Musculoskeletal: Negative for back pain and neck pain.  Skin: Negative for rash.  Neurological: Negative for syncope and headaches.     Physical Exam Updated Vital Signs BP 130/64 (BP Location: Left Arm)   Pulse 62   Temp 97.6 F (36.4 C) (Oral)   Resp 16   LMP  (LMP Unknown)   SpO2 100%   Physical Exam Vitals signs and nursing note reviewed.  Constitutional:      General: She is not in acute distress.    Appearance: She is well-developed. She is not diaphoretic.  HENT:     Head: Normocephalic.     Comments: Right supraorbital contusion Eyes:     Conjunctiva/sclera: Conjunctivae normal.  Neck:     Musculoskeletal: Normal range of motion.  Cardiovascular:     Rate and Rhythm: Normal rate and regular rhythm.     Heart sounds:  Normal heart sounds. No murmur. No friction rub. No gallop.   Pulmonary:     Effort: Pulmonary effort is normal. No respiratory distress.     Breath sounds: Normal breath sounds. No wheezing or rales.  Abdominal:     General: There is no distension.     Palpations: Abdomen is soft.     Tenderness: There is no abdominal tenderness. There is no guarding.  Musculoskeletal:     Right hip: She exhibits decreased range of motion, decreased strength and tenderness.     Left hip: She exhibits normal range of motion and normal strength.  Skin:    General: Skin is warm and dry.     Findings: No erythema or rash.  Neurological:     Mental Status: She is alert.     Comments: Knows name, believes we are in nursing home      ED Treatments / Results  Labs (all labs ordered are listed, but only abnormal results are displayed) Labs Reviewed  CBC WITH DIFFERENTIAL/PLATELET - Abnormal; Notable for the following components:      Result Value   RBC 2.87 (*)    Hemoglobin 9.7 (*)    HCT 29.1 (*)    MCV 101.4 (*)    RDW 17.5 (*)    Platelets 108 (*)    All other components within normal limits  COMPREHENSIVE METABOLIC PANEL - Abnormal; Notable for the following components:   Sodium 134 (*)    Potassium 3.1 (*)    Chloride 97 (*)    Glucose, Bld 151 (*)    Creatinine, Ser 1.87 (*)    Calcium 8.8 (*)    Total Protein 5.0 (*)    Albumin 3.2 (*)    GFR calc non Af Amer 24 (*)    GFR calc Af Amer 27 (*)    All other components within normal limits  URINALYSIS, ROUTINE W REFLEX MICROSCOPIC - Abnormal; Notable for the following components:   APPearance CLOUDY (*)    Ketones, ur 15 (*)    Leukocytes, UA TRACE (*)    All other components within normal limits  URINALYSIS, MICROSCOPIC (REFLEX) - Abnormal; Notable for the following components:   Bacteria, UA FEW (*)    All other components within normal limits  URINE CULTURE  CBC  BASIC METABOLIC PANEL    EKG EKG  Interpretation  Date/Time:  Sunday July 12 2018 12:29:39 EST Ventricular Rate:  66 PR Interval:    QRS Duration: 133 QT Interval:  429 QTC Calculation: 450 R Axis:   -53 Text Interpretation:  Sinus rhythm Borderline prolonged PR interval LVH with IVCD, LAD and secondary repol abnrm Anterior ST elevation, probably due to LVH No significant change since last tracing Confirmed by Gareth Morgan [phone]) on 07/12/2018 12:45:07 PM   Radiology Dg Shoulder Right  Result Date: 07/12/2018 CLINICAL DATA:  [place] twice today EXAM: RIGHT SHOULDER - 2+ VIEW COMPARISON:  05/28/2018 FINDINGS: Osseous demineralization. AC joint alignment normal. Prior ORIF proximal RIGHT humerus. Again identified small displaced bone fragments inferior to the glenohumeral joint. Hardware appears intact. No additional fracture, dislocation or bone destruction. Visualized RIGHT ribs intact. IMPRESSION: Postsurgical changes of ORIF proximal RIGHT humerus. Osseous demineralization without new abnormalities. Electronically Signed   By: Lavonia Dana M.D.   On: 07/12/2018 13:36   Ct Head Wo Contrast  Result Date: 07/12/2018 CLINICAL DATA:  Fall.  Head injury. EXAM: CT HEAD WITHOUT CONTRAST CT CERVICAL SPINE WITHOUT CONTRAST TECHNIQUE: Multidetector CT imaging of the head and cervical spine was performed following the standard protocol without intravenous contrast. Multiplanar CT image reconstructions of the cervical spine were also generated. 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 This measures approximately 23 mm in thickness. Mass-effect on the left cerebral hemisphere with 6 mm midline shift to the right, unchanged. Generalized atrophy.  Negative for hydrocephalus.  No acute infarct. Vascular: Calcified distal vertebral arteries and cavernous carotid bilaterally. Negative for hyperdense vessel Skull:  Negative Sinuses/Orbits: Paranasal sinuses clear bilaterally. Bilateral cataract surgery Other: None CT CERVICAL SPINE FINDINGS Alignment: 3 mm anterolisthesis C4-5. Mild anterolisthesis C7-T1, T1-2, T2-3 Skull base and vertebrae: Negative for fracture Soft tissues and spinal canal: Extensive atherosclerotic calcification. No soft tissue mass in the neck. Disc levels: Multilevel disc and facet degeneration throughout the cervical spine. Mild spinal stenosis at C3-4. Mild foraminal narrowing at multiple levels due to spurring. Upper chest: Lung apices clear bilaterally. Other: None IMPRESSION: 1. No acute intracranial abnormality 2. 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 Negative for cervical spine fracture. Multilevel degenerative changes spurring. Electronically Signed   By: Franchot Gallo M.D.   On: 07/12/2018 13:16   Ct Cervical Spine Wo Contrast  Result Date: 07/12/2018 CLINICAL DATA:  Fall.  Head injury. EXAM: CT HEAD WITHOUT CONTRAST CT CERVICAL SPINE WITHOUT CONTRAST TECHNIQUE: Multidetector CT imaging of the head and cervical spine was performed following the standard protocol without intravenous contrast. Multiplanar CT image reconstructions of the cervical spine were also generated. 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 This measures approximately 23 mm in thickness. Mass-effect on the left cerebral hemisphere with 6 mm midline shift to the right, unchanged. Generalized atrophy.  Negative for hydrocephalus.  No acute infarct. Vascular: Calcified distal vertebral arteries and cavernous carotid bilaterally. Negative for hyperdense vessel Skull: Negative Sinuses/Orbits: Paranasal sinuses clear bilaterally. Bilateral cataract surgery Other: None CT CERVICAL SPINE FINDINGS Alignment: 3 mm  anterolisthesis C4-5. Mild anterolisthesis C7-T1, T1-2, T2-3 Skull base and vertebrae: Negative for fracture Soft tissues and spinal canal: Extensive atherosclerotic calcification. No soft tissue mass in the neck. Disc levels: Multilevel disc and facet degeneration  throughout the cervical spine. Mild spinal stenosis at C3-4. Mild foraminal narrowing at multiple levels due to spurring. Upper chest: Lung apices clear bilaterally. Other: None IMPRESSION: 1. No acute intracranial abnormality 2. 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 Negative for cervical spine fracture. Multilevel degenerative changes spurring. Electronically Signed   By: Franchot Gallo M.D.   On: 07/12/2018 13:16   Dg Hip Unilat W Or Wo Pelvis 2-3 Views Right  Result Date: 07/12/2018 CLINICAL DATA:  [place] twice today, RIGHT hip pain EXAM: DG HIP (WITH OR WITHOUT PELVIS) 2-3V RIGHT COMPARISON:  None FINDINGS: Osseous demineralization. Hip and SI joint spaces preserved. Probable nondisplaced intertrochanteric fracture RIGHT femur. No additional fracture or dislocation. Few pelvic phleboliths and scattered atherosclerotic calcifications. Soft tissue swelling laterally overlying the lateral RIGHT hip region. IMPRESSION: Osseous demineralization with a probable nondisplaced intertrochanteric fracture of the RIGHT femur. Electronically Signed   By: Lavonia Dana M.D.   On: 07/12/2018 13:40    Procedures Procedures (including critical care time)  Medications Ordered in ED Medications  carvedilol (COREG) tablet 6.25 mg (6.25 mg Oral Given 07/12/18 1832)  HYDROcodone-acetaminophen (NORCO/VICODIN) 5-325 MG per tablet 1 tablet (has no administration in time range)  sertraline (ZOLOFT) tablet 25 mg (has no administration in time range)  docusate sodium (COLACE) capsule 100 mg (100 mg Oral Given 07/12/18 2343)  eltrombopag (PROMACTA) tablet 100 mg (100 mg Oral Not  Given 07/12/18 1833)  acetaminophen (TYLENOL) tablet 650 mg (has no administration in time range)    Or  acetaminophen (TYLENOL) suppository 650 mg (has no administration in time range)  0.9 %  sodium chloride infusion ( Intravenous New Bag/Given 07/12/18 1832)  morphine 2 MG/ML injection 1-2 mg (has no administration in time range)  cefTRIAXone (ROCEPHIN) 1 g in sodium chloride 0.9 % 100 mL IVPB (1 g Intravenous New Bag/Given 07/12/18 1852)  potassium chloride SA (K-DUR,KLOR-CON) CR tablet 40 mEq (40 mEq Oral Given 07/12/18 1833)     Initial Impression / Assessment and Plan / ED Course  I have reviewed the triage vital signs and the nursing notes.  Pertinent labs & imaging results that were available during my care of the patient were reviewed by me and considered in my medical decision making (see chart for details).      84 year old female with a history below presents with concern for fall at her skilled nursing facility, also reports has been eating poorly. CT head and CSpine without acute abnormalities, chronic large subdural noted and unchanged on previous. Labs without acute findings. UA pending at time of my care. XR of shoulder without any acute findings.  XR hip shows concern for fracture. Consulted orthopedic Dr. Ninfa Linden who will evaluate her.  Admitted for further care.   Final Clinical Impressions(s) / ED Diagnoses   Final diagnoses:  Closed nondisplaced intertrochanteric fracture of right femur, initial encounter [facility])    ED Discharge Orders    None       Gareth Morgan, MD 07/13/18 0010

## 2018-07-12 NOTE — Plan of Care (Signed)
  Problem: Education: Goal: Knowledge of General Education information will improve Description Including pain rating scale, medication(s)/side effects and non-pharmacologic comfort measures Outcome: Progressing   Problem: Health Behavior/Discharge Planning: Goal: Ability to manage health-related needs will improve Outcome: Progressing   

## 2018-07-12 NOTE — ED Triage Notes (Signed)
Pt arrived via [facility]; pt from [facility]; pt fell apparently twice today and the 1st fall the pt hit R side of face; pt c/o pain to R hip and has Dementia; 100/60; witnessed fall but unknown if patient had LOC but she fell out of chair; CBG 235; on Metoprolol ; contusion on R shoulder; LBBB; 134/86,

## 2018-07-12 NOTE — ED Notes (Signed)
Attempted to call report

## 2018-07-12 NOTE — ED Notes (Signed)
Patient not in room for blood draw.

## 2018-07-13 ENCOUNTER — Inpatient Hospital Stay (HOSPITAL_COMMUNITY): Payer: Medicare Other

## 2018-07-13 DIAGNOSIS — S72144A Nondisplaced intertrochanteric fracture of right femur, initial encounter for closed fracture: Secondary | ICD-10-CM

## 2018-07-13 DIAGNOSIS — E43 Unspecified severe protein-calorie malnutrition: Secondary | ICD-10-CM

## 2018-07-13 DIAGNOSIS — N183 Chronic kidney disease, stage 3 (moderate): Secondary | ICD-10-CM | POA: Diagnosis not present

## 2018-07-13 DIAGNOSIS — I5022 Chronic systolic (congestive) heart failure: Secondary | ICD-10-CM | POA: Diagnosis not present

## 2018-07-13 LAB — CBC
HEMATOCRIT: 22.1 % — AB (ref 36.0–46.0)
Hemoglobin: 7.6 g/dL — ABNORMAL LOW (ref 12.0–15.0)
MCH: 35.2 pg — ABNORMAL HIGH (ref 26.0–34.0)
MCHC: 34.4 g/dL (ref 30.0–36.0)
MCV: 102.3 fL — ABNORMAL HIGH (ref 80.0–100.0)
Platelets: 75 10*3/uL — ABNORMAL LOW (ref 150–400)
RBC: 2.16 MIL/uL — ABNORMAL LOW (ref 3.87–5.11)
RDW: 17.5 % — ABNORMAL HIGH (ref 11.5–15.5)
WBC: 6.6 10*3/uL (ref 4.0–10.5)
nRBC: 0 % (ref 0.0–0.2)

## 2018-07-13 LAB — BASIC METABOLIC PANEL
Anion gap: 9 (ref 5–15)
BUN: 20 mg/dL (ref 8–23)
CO2: 25 mmol/L (ref 22–32)
Calcium: 8.3 mg/dL — ABNORMAL LOW (ref 8.9–10.3)
Chloride: 103 mmol/L (ref 98–111)
Creatinine, Ser: 1.73 mg/dL — ABNORMAL HIGH (ref 0.44–1.00)
GFR calc Af Amer: 30 mL/min — ABNORMAL LOW (ref >60)
GFR calc non Af Amer: 26 mL/min — ABNORMAL LOW (ref >60)
Glucose, Bld: 132 mg/dL — ABNORMAL HIGH (ref 70–99)
POTASSIUM: 3.6 mmol/L (ref 3.5–5.1)
Sodium: 137 mmol/L (ref 135–145)

## 2018-07-13 LAB — PREPARE RBC (CROSSMATCH)

## 2018-07-13 MED ORDER — SODIUM CHLORIDE 0.9% IV SOLUTION
Freq: Once | INTRAVENOUS | Status: AC
  Administered 2018-07-13: 19:00:00 via INTRAVENOUS

## 2018-07-13 MED ORDER — ENSURE ENLIVE PO LIQD
237.0000 mL | Freq: Three times a day (TID) | ORAL | Status: DC
  Administered 2018-07-13 – 2018-07-17 (×11): 237 mL via ORAL

## 2018-07-13 NOTE — Progress Notes (Signed)
Patient ID: Lisa James, female   DOB: Jul 29, 1927, 84 y.o.   MRN: [id number] Based on the right hip x-rays, CT scan, and clinical examine, the patient can be up with therapy with attempting full weight bearing as tolerated on her right hip.  She needs PT/OT to help assess her mobility and safety given her significant deconditioning.

## 2018-07-13 NOTE — Progress Notes (Signed)
Initial Nutrition Assessment  DOCUMENTATION CODES:   Severe malnutrition in context of chronic illness  INTERVENTION:    Ensure Enlive po BID, each supplement provides 350 kcal and 20 grams of protein  Magic cup TID with meals, each supplement provides 290 kcal and 9 grams of protein  NUTRITION DIAGNOSIS:   Severe Malnutrition related to chronic illness(dementia, CHF, CKD) as evidenced by severe muscle depletion, percent weight loss(23% weight loss within a month).  GOAL:   Patient will meet greater than or equal to 90% of their needs  MONITOR:   PO intake, Supplement acceptance, Skin  REASON FOR ASSESSMENT:   Consult Assessment of nutrition requirement/status  ASSESSMENT:   84 yo female with PMH of MI, CHF, HTN, CAD, NHL, DM, SDH, CKD, and dementia who was admitted S/P fall from wheelchair at her nursing facility (she resides in a memory care unit) with a R hip fracture.   Patient is very confused, wants to go home. She has been eating very poorly today. Suspect poor intake now and PTA is related to confusion / dementia.   From review of weight encounters, patient has lost  23% of usual weight within the past month.   Labs reviewed.  Medications reviewed.   NUTRITION - FOCUSED PHYSICAL EXAM:  Unable to assess many areas due to patient's confusion.     Most Recent Value  Orbital Region  Unable to assess  Upper Arm Region  Unable to assess  Thoracic and Lumbar Region  Unable to assess  Buccal Region  Unable to assess  Temple Region  Severe depletion  Clavicle Bone Region  Severe depletion  Clavicle and Acromion Bone Region  Severe depletion  Scapular Bone Region  Unable to assess  Dorsal Hand  Unable to assess  Patellar Region  Unable to assess  Anterior Thigh Region  Unable to assess  Posterior Calf Region  Unable to assess  Edema (RD Assessment)  Unable to assess  Hair  Unable to assess  Eyes  Unable to assess  Mouth  Unable to assess  Skin  Unable to  assess  Nails  Unable to assess       Diet Order:   Diet Order            Diet regular Room service appropriate? Yes; Fluid consistency: Thin  Diet effective now              EDUCATION NEEDS:   No education needs have been identified at this time  Skin:  Skin Assessment: Reviewed RN Assessment  Last BM:  PTA  Height:   Ht Readings from Last 1 Encounters:  07/10/18 4\' 10"  (1.473 m)    Weight:   Wt Readings from Last 1 Encounters:  07/10/18 43 kg    Ideal Body Weight:  43.9 kg  BMI:  19.8  Estimated Nutritional Needs:   Kcal:  1200-1400  Protein:  55-70 gm  Fluid:  >/= 1.4 L    Molli Barrows, RD, LDN, [place] Pager [phone] After Hours Pager [phone]

## 2018-07-13 NOTE — Evaluation (Signed)
Physical Therapy Evaluation Patient Details Name: Lisa James MRN: [id number] DOB: 1927-11-18 Today's Date: 07/13/2018   History of Present Illness  84 y.o. female with medical history significant of dementia, type 2 diabetes mellitus, hypertension, chronic systolic heart failure, history of coronary artery disease, non-Hodgkin's lymphoma, history of chronic subdural hematoma, chronic thrombocytopenia, was brought to the hospital after a fall from wheelchair.  Xrays revealed Closed fracture of neck of right femur which can be treated non-operatively with WBAT. Patient is confused and unable to provide history. Per notes pt is resident at [facility]  On entry pt calling out "I gotta go." repeatedly. Pt oriented to self, unsure where she is and when given choices can pick out she is living at [facility]. Pt limited in safe mobility by decreased safety awareness, decreased knowledge of DME, and decreased balance. Pt is currently min guard for bed mobility, minA for transfers and modA for ambulation due to decreased balance and safe use of RW. Pt shows no sign of R hip pain with mobility. Pt requires 24 hour supervision given her impulsive movement and decreased safety. PT recommends return to [facility] with increased supervision given her impulsivity. PT will continue to follow acutely.     Follow Up Recommendations SNF;Supervision/Assistance - 24 hour    Equipment Recommendations  None recommended by PT       Precautions / Restrictions Precautions Precautions: Fall Precaution Comments: fx result of fall Restrictions Weight Bearing Restrictions: Yes RLE Weight Bearing: Weight bearing as tolerated      Mobility  Bed Mobility Overal bed mobility: Needs Assistance Bed Mobility: Supine to Sit     Supine to sit: Min guard     General bed mobility comments: min guard for safety  Transfers Overall transfer level: Needs  assistance Equipment used: Rolling walker (2 wheeled) Transfers: Sit to/from Stand Sit to Stand: Min assist         General transfer comment: minA for steadying, good power up  Ambulation/Gait Ambulation/Gait assistance: Mod assist Gait Distance (Feet): 15 Feet Assistive device: Rolling walker (2 wheeled) Gait Pattern/deviations: Step-through pattern;Decreased step length - right;Decreased step length - left;Shuffle Gait velocity: slowed Gait velocity interpretation: <1.31 ft/sec, indicative of household ambulator General Gait Details: modA for maintaining safe distance with RW, and navigating around obstacles in room, unsteady but no LoB         Balance Overall balance assessment: Needs assistance Sitting-balance support: Feet supported;No upper extremity supported Sitting balance-Leahy Scale: Fair     Standing balance support: Bilateral upper extremity supported;Single extremity supported Standing balance-Leahy Scale: Poor                               Pertinent Vitals/Pain Pain Assessment: Faces Pain Score: 0-No pain    Home Living Family/patient expects to be discharged to:: Skilled nursing facility                      Prior Function Level of Independence: Needs assistance                  Extremity/Trunk Assessment   Upper Extremity Assessment Upper Extremity Assessment: Generalized weakness;Difficult to assess due to impaired cognition    Lower Extremity Assessment Lower Extremity Assessment: Generalized weakness;Difficult to assess due to impaired cognition       Communication   Communication: HOH;Expressive difficulties  Cognition Arousal/Alertness: Awake/alert Behavior During Therapy:  Impulsive;Agitated Overall Cognitive Status: History of cognitive impairments - at baseline                                 General Comments: advance dementia, Pt keeps repeating "I've got to go." Only oriented to self       General Comments General comments (skin integrity, edema, etc.): VSS, Large contusion on R hip. Pt does not seem to notice any pain in hip with movement or weightbearing        Assessment/Plan    PT Assessment Patient needs continued PT services  PT Problem List Decreased balance;Decreased activity tolerance;Decreased cognition;Decreased safety awareness;Decreased knowledge of use of DME       PT Treatment Interventions DME instruction;Gait training;Functional mobility training;Therapeutic activities;Therapeutic exercise;Balance training;Patient/family education    PT Goals (Current goals can be found in the Care Plan section)  Acute Rehab PT Goals Patient Stated Goal: "get home" PT Goal Formulation: Patient unable to participate in goal setting Time For Goal Achievement: 07/27/18 Potential to Achieve Goals: Poor    Frequency Min 3X/week    AM-PAC PT "6 Clicks" Mobility  Outcome Measure Help needed turning from your back to your side while in a flat bed without using bedrails?: A Little Help needed moving from lying on your back to sitting on the side of a flat bed without using bedrails?: A Little Help needed moving to and from a bed to a chair (including a wheelchair)?: A Lot Help needed standing up from a chair using your arms (e.g., wheelchair or bedside chair)?: A Little Help needed to walk in hospital room?: A Lot Help needed climbing 3-5 steps with a railing? : Total 6 Click Score: 14    End of Session Equipment Utilized During Treatment: Gait belt Activity Tolerance: Treatment limited secondary to agitation Patient left: in bed;with call bell/phone within reach;with bed alarm set;with family/visitor present Nurse Communication: Mobility status;Other (comment)(decreased steadiness with ambulation ) PT Visit Diagnosis: Unsteadiness on feet (R26.81);Other abnormalities of gait and mobility (R26.89);Repeated falls (R29.6);Muscle weakness (generalized)  (M62.81);History of falling (Z91.81);Difficulty in walking, not elsewhere classified (R26.2)    Time: 0263-7858 PT Time Calculation (min) (ACUTE ONLY): 23 min   Charges:   PT Evaluation $PT Eval Moderate Complexity: 1 Mod PT Treatments $Gait Training: 8-22 mins        Lisa James PT, DPT Acute Rehabilitation Services Pager [phone] Office [phone]   [place] 07/13/2018, 4:35 PM

## 2018-07-13 NOTE — Progress Notes (Signed)
Patient ID: Lisa James, female   DOB: June 06, 1928, 84 y.o.   MRN: [id number] The patient is sleeping this am so I did not wake her.  She does have a NA at the bedside.  A CT scan of her right hip was finally performed.  I'll review it with Radiology today.  From my read, it looks like her right proximal femur fracture just involves the greater trochanter.  If that is the case, this can definitely be treated non-operatively with WBAT.  Since she is such a fall risk, however, I would only allow her to just put weight on her legs to pivot into a wheelchair.  Will follow-up later today.  No surgery scheduled for today given the CT of her right hip.

## 2018-07-13 NOTE — Plan of Care (Signed)

## 2018-07-13 NOTE — Progress Notes (Signed)
PROGRESS NOTE    Lisa James  VOZ:[id number] DOB: 11/11/1927 DOA: 07/12/2018 PCP: Merrilee Seashore, MD    Brief Narrative:  Lisa James is a 84 y.o. female with medical history significant of dementia, type 2 diabetes mellitus, hypertension, chronic systolic heart failure, history of coronary artery disease, non-Hodgkin's lymphoma, history of chronic subdural hematoma, chronic thrombocytopenia, was brought to the hospital after a fall from wheelchair.   Assessment & Plan:   Active Problems:   HYPERTENSION, BENIGN   SYSTOLIC HEART FAILURE, CHRONIC   Confusion   Subdural hematoma, acute (HCC)   Diabetes mellitus (HCC)   Non-Hodgkin's lymphoma (HCC)   CAD (coronary artery disease)   Chronic kidney disease (CKD), stage III (moderate) (HCC)   GERD (gastroesophageal reflux disease)   Idiopathic thrombocytopenic purpura (HCC)   Altered mental status   UTI (urinary tract infection)   Closed right hip fracture, initial encounter (HCC)   Protein-calorie malnutrition, severe  Questionable fall with closed right hip fracture No syncope noted. CT of the right hip without contrast ordered and showed Isolated fractures involving the greater trochanter. I do not see an intertrochanteric component or involvement of the femoral neck.  Associated intramuscular and subcutaneous hematomas. Pain control.  Orthopedics consulted and recommendations given.  Will wait for the PT/OT EVAL   Abnormal UA acute encephalopathy probable urinary tract infection Rocephin started and urine culture still pending.   Hypertension Blood pressure parameters well controlled, resume Coreg.   Non-Hodgkin's lymphoma Patient is on Promacta.  Continue the same.   Type 2 diabetes mellitus Diet controlled.  AKI probably secondary to dehydration.  Baseline creatinine between 1 and 1.2.  Urine analysis done.  We will gently hydrate her overnight and repeat creatinine in the morning show  improvement. if no improvement will get ultrasound kidney to rule out obstruction.   Hypokalemia Replace as needed.  Acute metabolic encephalopathy Probably secondary to AKI and dehydration Patient has baseline dementia and she appears to be more confused than baseline as per the niece.  We will get a sitter at bedside as she is trying to get out of bed and is a danger to herself due to high risk of falls.   Chronic thrombocytopenia Follows up as outpatient.  Platelet counts are 75,000 and stable.   Chronic systolic heart failure She appears to be slightly dehydrated.    DVT prophylaxis: scd's Code Status: full code.  Family Communication: discussed with daughter over the phone.  Disposition Plan: pending clinical improvement.    Consultants:   Orthopedics.    Procedures:none.   Antimicrobials: rocephin   Subjective: No complaints.   Objective: Vitals:   07/12/18 2029 07/13/18 0643 07/13/18 0905 07/13/18 1406  BP: 130/64 (!) 123/98 (!) 150/57 133/83  Pulse: 62 75 67 68  Resp:  14  16  Temp: 97.6 F (36.4 C) 98.1 F (36.7 C) (!) 97.5 F (36.4 C) (!) 97.4 F (36.3 C)  TempSrc: Oral Oral Oral Oral  SpO2: 100% 100% 99% 100%    Intake/Output Summary (Last 24 hours) at 07/13/2018 1552 Last data filed at 07/13/2018 1337 Gross per 24 hour  Intake 440 ml  Output 165 ml  Net 275 ml   There were no vitals filed for this visit.  Examination:  General exam: Appears calm and comfortable  Respiratory system: Clear to auscultation. Respiratory effort normal. Cardiovascular system: S1 & S2 heard, RRR. No JVD, murmurs, rubs, gallops or clicks. No pedal edema. Gastrointestinal system: Abdomen is soft NT nd bs+  Central nervous system: confused, slightly agitated and restless.  Extremities: Symmetric 5 x 5 power.right sided hip bruise.  Skin: No rashes, lesions or ulcers Psychiatry: slightly agitated or restless.     Data Reviewed: I have personally  reviewed following labs and imaging studies  CBC: Recent Labs  Lab 07/10/18 1408 07/12/18 1347 07/13/18 0237  WBC 6.4 9.1 6.6  NEUTROABS 4.2 6.7  --   HGB 12.2 9.7* 7.6*  HCT 35.7* 29.1* 22.1*  MCV 100.3* 101.4* 102.3*  PLT 75* 108* 75*   Basic Metabolic Panel: Recent Labs  Lab 07/12/18 1347 07/13/18 0237  NA 134* 137  K 3.1* 3.6  CL 97* 103  CO2 25 25  GLUCOSE 151* 132*  BUN 22 20  CREATININE 1.87* 1.73*  CALCIUM 8.8* 8.3*   GFR: Estimated Creatinine Clearance: 14.5 mL/min (A) (by C-G formula based on SCr of 1.73 mg/dL (H)). Liver Function Tests: Recent Labs  Lab 07/12/18 1347  AST 22  ALT 13  ALKPHOS 62  BILITOT 1.1  PROT 5.0*  ALBUMIN 3.2*   No results for input(s): LIPASE, AMYLASE in the last 168 hours. No results for input(s): AMMONIA in the last 168 hours. Coagulation Profile: No results for input(s): INR, PROTIME in the last 168 hours. Cardiac Enzymes: No results for input(s): CKTOTAL, CKMB, CKMBINDEX, TROPONINI in the last 168 hours. BNP (last 3 results) No results for input(s): PROBNP in the last 8760 hours. HbA1C: No results for input(s): HGBA1C in the last 72 hours. CBG: No results for input(s): GLUCAP in the last 168 hours. Lipid Profile: No results for input(s): CHOL, HDL, LDLCALC, TRIG, CHOLHDL, LDLDIRECT in the last 72 hours. Thyroid Function Tests: No results for input(s): TSH, T4TOTAL, FREET4, T3FREE, THYROIDAB in the last 72 hours. Anemia Panel: No results for input(s): VITAMINB12, FOLATE, FERRITIN, TIBC, IRON, RETICCTPCT in the last 72 hours. Sepsis Labs: No results for input(s): PROCALCITON, LATICACIDVEN in the last 168 hours.  Recent Results (from the past 240 hour(s))  Urine culture     Status: Abnormal (Preliminary result)   Collection Time: 07/12/18  3:42 PM  Result Value Ref Range Status   Specimen Description URINE, RANDOM  Final   Special Requests   Final    NONE Performed at [facility], [address],  [place], [place] [address]    Culture 30,000 COLONIES/mL ENTEROCOCCUS FAECALIS (A)  Final   Report Status PENDING  Incomplete         Radiology Studies: Dg Shoulder Right  Result Date: 07/12/2018 CLINICAL DATA:  [place] twice today EXAM: RIGHT SHOULDER - 2+ VIEW COMPARISON:  05/28/2018 FINDINGS: Osseous demineralization. AC joint alignment normal. Prior ORIF proximal RIGHT humerus. Again identified small displaced bone fragments inferior to the glenohumeral joint. Hardware appears intact. No additional fracture, dislocation or bone destruction. Visualized RIGHT ribs intact. IMPRESSION: Postsurgical changes of ORIF proximal RIGHT humerus. Osseous demineralization without new abnormalities. Electronically Signed   By: Lavonia Dana M.D.   On: 07/12/2018 13:36   Ct Head Wo Contrast  Result Date: 07/12/2018 CLINICAL DATA:  Fall.  Head injury. EXAM: CT HEAD WITHOUT CONTRAST CT CERVICAL SPINE WITHOUT CONTRAST TECHNIQUE: Multidetector CT imaging of the head and cervical spine was performed following the standard protocol without intravenous contrast. Multiplanar CT image reconstructions of the cervical spine were also generated. 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 This measures approximately 23 mm in thickness.  Mass-effect on the left cerebral hemisphere with 6 mm midline shift to the right, unchanged. Generalized atrophy.  Negative for hydrocephalus.  No acute infarct. Vascular: Calcified distal vertebral arteries and cavernous carotid bilaterally. Negative for hyperdense vessel Skull: Negative Sinuses/Orbits: Paranasal sinuses clear bilaterally. Bilateral cataract surgery Other: None CT CERVICAL SPINE FINDINGS Alignment: 3 mm anterolisthesis C4-5. Mild anterolisthesis C7-T1, T1-2, T2-3 Skull base and vertebrae: Negative for fracture Soft tissues and spinal canal: Extensive  atherosclerotic calcification. No soft tissue mass in the neck. Disc levels: Multilevel disc and facet degeneration throughout the cervical spine. Mild spinal stenosis at C3-4. Mild foraminal narrowing at multiple levels due to spurring. Upper chest: Lung apices clear bilaterally. Other: None IMPRESSION: 1. No acute intracranial abnormality 2. 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 Negative for cervical spine fracture. Multilevel degenerative changes spurring. Electronically Signed   By: Franchot Gallo M.D.   On: 07/12/2018 13:16   Ct Cervical Spine Wo Contrast  Result Date: 07/12/2018 CLINICAL DATA:  Fall.  Head injury. EXAM: CT HEAD WITHOUT CONTRAST CT CERVICAL SPINE WITHOUT CONTRAST TECHNIQUE: Multidetector CT imaging of the head and cervical spine was performed following the standard protocol without intravenous contrast. Multiplanar CT image reconstructions of the cervical spine were also generated. 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 This measures approximately 23 mm in thickness. Mass-effect on the left cerebral hemisphere with 6 mm midline shift to the right, unchanged. Generalized atrophy.  Negative for hydrocephalus.  No acute infarct. Vascular: Calcified distal vertebral arteries and cavernous carotid bilaterally. Negative for hyperdense vessel Skull: Negative Sinuses/Orbits: Paranasal sinuses clear bilaterally. Bilateral cataract surgery Other: None CT CERVICAL SPINE FINDINGS Alignment: 3 mm anterolisthesis C4-5. Mild anterolisthesis C7-T1, T1-2, T2-3 Skull base and vertebrae: Negative for fracture Soft tissues and spinal canal: Extensive atherosclerotic calcification. No soft tissue mass in the neck. Disc levels: Multilevel disc and facet degeneration throughout the cervical spine.  Mild spinal stenosis at C3-4. Mild foraminal narrowing at multiple levels due to spurring. Upper chest: Lung apices clear bilaterally. Other: None IMPRESSION: 1. No acute intracranial abnormality 2. 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 Negative for cervical spine fracture. Multilevel degenerative changes spurring. Electronically Signed   By: Franchot Gallo M.D.   On: 07/12/2018 13:16   Ct Hip Right Wo Contrast  Result Date: 07/13/2018 CLINICAL DATA:  [place].  Suspected hip fracture on plain films. EXAM: CT OF THE RIGHT HIP WITHOUT CONTRAST TECHNIQUE: Multidetector CT imaging of the right hip was performed according to the standard protocol. Multiplanar CT image reconstructions were also generated. COMPARISON:  Radiographs 07/12/2018 FINDINGS: There are relatively nondisplaced fractures of the greater trochanter. I do not see an intertrochanteric fracture or femoral neck fracture. The acetabulum is intact. No iliac or sacral fractures are identified. Moderate degenerative changes are noted at the pubic symphysis. No pubic rami fractures. No significant intrapelvic abnormalities. Advanced atherosclerotic calcifications are noted. Moderate-sized hematoma noted in the subcutaneous tissues superficial to the muscle fascia. There is surrounding soft tissue swelling/edema/hemorrhage. Intermuscular hemorrhage is also suggested in the gluteus muscles. IMPRESSION: 1. Isolated fractures involving the greater trochanter. I do not see an intertrochanteric component or involvement of the femoral neck. 2. Associated intramuscular and subcutaneous hematomas. 3. No pelvic fractures. Electronically  Signed   By: Marijo Sanes M.D.   On: 07/13/2018 07:29   Dg Hip Unilat W Or Wo Pelvis 2-3 Views Right  Result Date: 07/12/2018 CLINICAL DATA:  [place] twice today, RIGHT hip pain EXAM: DG HIP (WITH OR WITHOUT PELVIS) 2-3V RIGHT COMPARISON:  None  FINDINGS: Osseous demineralization. Hip and SI joint spaces preserved. Probable nondisplaced intertrochanteric fracture RIGHT femur. No additional fracture or dislocation. Few pelvic phleboliths and scattered atherosclerotic calcifications. Soft tissue swelling laterally overlying the lateral RIGHT hip region. IMPRESSION: Osseous demineralization with a probable nondisplaced intertrochanteric fracture of the RIGHT femur. Electronically Signed   By: Lavonia Dana M.D.   On: 07/12/2018 13:40        Scheduled Meds: . sodium chloride   Intravenous Once  . carvedilol  6.25 mg Oral BID WC  . docusate sodium  100 mg Oral BID  . eltrombopag  100 mg Oral Daily  . feeding supplement (ENSURE ENLIVE)  237 mL Oral TID BM  . sertraline  25 mg Oral Daily   Continuous Infusions: . sodium chloride 75 mL/hr at 07/13/18 0858  . cefTRIAXone (ROCEPHIN)  IV 1 g (07/12/18 1852)     LOS: 1 day    Time spent: 35 minutes    Hosie Poisson, MD [facility] Pager [phone]  If 7PM-7AM, please contact night-coverage [email or web address] Password [email or web address] 07/13/2018, 3:52 PM

## 2018-07-14 DIAGNOSIS — S72144A Nondisplaced intertrochanteric fracture of right femur, initial encounter for closed fracture: Secondary | ICD-10-CM | POA: Diagnosis not present

## 2018-07-14 DIAGNOSIS — I5022 Chronic systolic (congestive) heart failure: Secondary | ICD-10-CM | POA: Diagnosis not present

## 2018-07-14 DIAGNOSIS — N183 Chronic kidney disease, stage 3 (moderate): Secondary | ICD-10-CM | POA: Diagnosis not present

## 2018-07-14 LAB — URINE CULTURE: Culture: 30000 — AB

## 2018-07-14 LAB — BASIC METABOLIC PANEL
Anion gap: 8 (ref 5–15)
BUN: 18 mg/dL (ref 8–23)
CO2: 24 mmol/L (ref 22–32)
Calcium: 8 mg/dL — ABNORMAL LOW (ref 8.9–10.3)
Chloride: 106 mmol/L (ref 98–111)
Creatinine, Ser: 1.17 mg/dL — ABNORMAL HIGH (ref 0.44–1.00)
GFR calc non Af Amer: 42 mL/min — ABNORMAL LOW (ref >60)
GFR, EST AFRICAN AMERICAN: 48 mL/min — AB (ref >60)
Glucose, Bld: 102 mg/dL — ABNORMAL HIGH (ref 70–99)
Potassium: 3.9 mmol/L (ref 3.5–5.1)
SODIUM: 138 mmol/L (ref 135–145)

## 2018-07-14 LAB — BPAM RBC
BLOOD PRODUCT EXPIRATION DATE: 201912302359
ISSUE DATE / TIME: 201912231829
Unit Type and Rh: 9500

## 2018-07-14 LAB — CBC
HCT: 29.6 % — ABNORMAL LOW (ref 36.0–46.0)
Hemoglobin: 10.3 g/dL — ABNORMAL LOW (ref 12.0–15.0)
MCH: 35.4 pg — ABNORMAL HIGH (ref 26.0–34.0)
MCHC: 34.8 g/dL (ref 30.0–36.0)
MCV: 101.7 fL — ABNORMAL HIGH (ref 80.0–100.0)
Platelets: 95 10*3/uL — ABNORMAL LOW (ref 150–400)
RBC: 2.91 MIL/uL — ABNORMAL LOW (ref 3.87–5.11)
RDW: 18.5 % — ABNORMAL HIGH (ref 11.5–15.5)
WBC: 7.1 10*3/uL (ref 4.0–10.5)
nRBC: 0 % (ref 0.0–0.2)

## 2018-07-14 LAB — URINALYSIS, ROUTINE W REFLEX MICROSCOPIC
Bacteria, UA: NONE SEEN
Bilirubin Urine: NEGATIVE
Glucose, UA: NEGATIVE mg/dL
Hgb urine dipstick: NEGATIVE
Ketones, ur: 5 mg/dL — AB
Nitrite: NEGATIVE
Protein, ur: NEGATIVE mg/dL
Specific Gravity, Urine: 1.018 (ref 1.005–1.030)
pH: 5 (ref 5.0–8.0)

## 2018-07-14 LAB — TYPE AND SCREEN
ABO/RH(D): O NEG
Antibody Screen: NEGATIVE
Unit division: 0

## 2018-07-14 MED ORDER — NYSTATIN 100000 UNIT/GM EX POWD
Freq: Three times a day (TID) | CUTANEOUS | Status: DC
  Administered 2018-07-14 – 2018-07-16 (×9): via TOPICAL
  Filled 2018-07-14 (×2): qty 15

## 2018-07-14 MED ORDER — LORAZEPAM 0.5 MG PO TABS
0.5000 mg | ORAL_TABLET | Freq: Three times a day (TID) | ORAL | Status: DC | PRN
  Administered 2018-07-14 (×2): 0.5 mg via ORAL
  Filled 2018-07-14 (×2): qty 1

## 2018-07-14 MED ORDER — LORAZEPAM 2 MG/ML IJ SOLN
0.5000 mg | Freq: Once | INTRAMUSCULAR | Status: AC
  Administered 2018-07-14: 0.5 mg via INTRAVENOUS
  Filled 2018-07-14: qty 1

## 2018-07-14 MED ORDER — AMOXICILLIN 500 MG PO CAPS
500.0000 mg | ORAL_CAPSULE | Freq: Two times a day (BID) | ORAL | Status: AC
  Administered 2018-07-14 – 2018-07-16 (×6): 500 mg via ORAL
  Filled 2018-07-14 (×6): qty 1

## 2018-07-14 NOTE — NC FL2 (Signed)
[place] LEVEL OF CARE SCREENING TOOL     IDENTIFICATION  Patient Name: Lisa James Birthdate: 10-Nov-1927 Sex: female Admission Date (Current Location): 07/12/2018  [facility] and [place] Number:  Herbalist and Address:         Provider Number: [id number]  Attending Physician Name and Address:  Hosie Poisson, MD  Relative Name and Phone Number:  Anne Ng [phone]    Current Level of Care: Hospital Recommended Level of Care: [place] Prior Approval Number:    Date Approved/Denied:   PASRR Number: [id number]  Discharge Plan: SNF    Current Diagnoses: Patient Active Problem List   Diagnosis Date Noted  . Protein-calorie malnutrition, severe 07/13/2018  . Closed right hip fracture, initial encounter ([place]) 07/12/2018  . Acute on chronic anemia 06/05/2018  . Chronic diastolic CHF (congestive heart failure) ([place]) 06/05/2018  . Closed fracture of right proximal humerus 05/30/2018  . Proximal humerus fracture 05/28/2018  . Pressure injury of skin 05/15/2018  . Fall 05/14/2018  . Closed comminuted right humeral fracture 05/14/2018  . UTI (urinary tract infection) 05/14/2018  . Hyponatremia 05/14/2018  . Leucocytosis 05/14/2018  . Altered mental status 05/13/2015  . Idiopathic thrombocytopenic purpura ([place]) 07/06/2013  . Aortic valve stenosis 06/19/2013  . Immune thrombocytopenic purpura ([place]) 01/21/2013  . GERD (gastroesophageal reflux disease) 12/17/2012  . Type II or unspecified type diabetes mellitus with renal manifestations, not stated as uncontrolled(250.40) 12/10/2012  . Chronic kidney disease (CKD), stage III (moderate) ([place]) 11/22/2012  . Diabetes mellitus ([place]) 07/03/2011  . Depression 07/03/2011  . Non-Hodgkin's lymphoma ([place]) 07/03/2011  . CAD (coronary artery disease) 07/03/2011  . PAD (peripheral artery disease) ([place]) 07/03/2011  . Vitamin D deficiency 07/03/2011  . Subdural hematoma ([place]) 07/03/2011  .  Confusion 05/12/2011  . Subdural hematoma, acute ([place]) 05/12/2011  . HYPERLIPIDEMIA TYPE IIB / III 05/10/2009  . OLD MYOCARDIAL INFARCTION 05/10/2009  . Occlusion and stenosis of carotid artery without mention of cerebral infarction 05/10/2009  . HYPERTENSION, BENIGN 11/03/2008  . MITRAL REGURGITATION 10/29/2008  . ISCHEMIC HEART DISEASE 10/29/2008  . SYSTOLIC HEART FAILURE, CHRONIC 10/29/2008    Orientation RESPIRATION BLADDER Height & Weight     Self  Normal Continent Weight:   Height:     BEHAVIORAL SYMPTOMS/MOOD NEUROLOGICAL BOWEL NUTRITION STATUS      Continent Diet(see discharge summary)  AMBULATORY STATUS COMMUNICATION OF NEEDS Skin   Extensive Assist Verbally Skin abrasions(right arm abrasions, ecchymosis face and right hip)                       Personal Care Assistance Level of Assistance  Bathing, Feeding, Dressing, Total care Bathing Assistance: Maximum assistance Feeding assistance: Limited assistance(needs set up) Dressing Assistance: Maximum assistance Total Care Assistance: Maximum assistance   Functional Limitations Info  Sight, Hearing, Speech Sight Info: Adequate Hearing Info: Adequate Speech Info: Adequate    SPECIAL CARE FACTORS FREQUENCY  PT (By licensed PT), OT (By licensed OT)     PT Frequency: min 5x weekly OT Frequency: min 3x weekly            Contractures Contractures Info: Not present    Additional Factors Info  Code Status, Allergies Code Status Info: Full Allergies Info: Ace Inhibitors           Current Medications (07/14/2018):  This is the current hospital active medication list Current Facility-Administered Medications  Medication Dose Route Frequency Provider Last Rate Last Dose  . 0.9 %  sodium chloride infusion   Intravenous Continuous Hosie Poisson, MD 75 mL/hr at 07/14/18 0256    . acetaminophen (TYLENOL) tablet 650 mg  650 mg Oral Q6H PRN Hosie Poisson, MD   650 mg at 07/13/18 2015   Or  . acetaminophen  (TYLENOL) suppository 650 mg  650 mg Rectal Q6H PRN Hosie Poisson, MD      . amoxicillin (AMOXIL) capsule 500 mg  500 mg Oral Q12H Hosie Poisson, MD      . carvedilol (COREG) tablet 6.25 mg  6.25 mg Oral BID WC Hosie Poisson, MD   6.25 mg at 07/13/18 1641  . docusate sodium (COLACE) capsule 100 mg  100 mg Oral BID Hosie Poisson, MD   100 mg at 07/13/18 2015  . eltrombopag (PROMACTA) tablet 100 mg  100 mg Oral Daily Hosie Poisson, MD   100 mg at 07/13/18 1104  . feeding supplement (ENSURE ENLIVE) (ENSURE ENLIVE) liquid 237 mL  237 mL Oral TID BM Hosie Poisson, MD   237 mL at 07/13/18 2016  . HYDROcodone-acetaminophen (NORCO/VICODIN) 5-325 MG per tablet 1 tablet  1 tablet Oral Q6H PRN Hosie Poisson, MD   1 tablet at 07/14/18 0039  . morphine 2 MG/ML injection 1-2 mg  1-2 mg Intravenous Q4H PRN Hosie Poisson, MD   1 mg at 07/14/18 0252  . nystatin (MYCOSTATIN/NYSTOP) topical powder   Topical TID Schorr, Rhetta Mura, NP      . sertraline (ZOLOFT) tablet 25 mg  25 mg Oral Daily Hosie Poisson, MD   25 mg at 07/13/18 0981     Discharge Medications: Please see discharge summary for a list of discharge medications.  Relevant Imaging Results:  Relevant Lab Results:   Additional Information SSN: [id number]  Alberteen Sam, LCSW

## 2018-07-14 NOTE — Clinical Social Work Note (Signed)
Clinical Social Work Assessment  Patient Details  Name: Lisa James MRN: [id number] Date of Birth: 05-28-1928  Date of referral:  07/14/18               Reason for consult:  Discharge Planning                Permission sought to share information with:  Case Manager, Facility Sport and exercise psychologist, Family Supports Permission granted to share information::  Yes, Verbal Permission Granted  Name::     Chief Operating Officer::  SNF  Relationship::  neice  Contact Information:  [phone]  Housing/Transportation Living arrangements for the past 2 months:  Skilled Nursing Facility(was at [facility]) Source of Information:  Patient Patient Interpreter Needed:  None Criminal Activity/Legal Involvement Pertinent to Current Situation/Hospitalization:  No - Comment as needed Significant Relationships:  Other Family Members Lives with:  Facility Resident Do you feel safe going back to the place where you live?  Yes Need for family participation in patient care:  Yes (Comment)  Care giving concerns:  CSW received referral for possible SNF placement at time of discharge. Spoke with patient's niece Lisa James regarding possibility of SNF placement as patient is currently disoriented x3. Patient's family   is currently unable to care for her at their home given patient's current needs and fall risk.  Patient and niece Lisa James expressed understanding of PT recommendation and are agreeable to SNF placement at time of discharge. CSW to continue to follow and assist with discharge planning needs.      Social Worker assessment / plan:  Spoke with patient and  niece Lisa James  concerning possibility of rehab at [facility] before returning home. Patient is from [place] reports she wants patient to return there.    Employment status:  Retired Nurse, adult PT Recommendations:  [place] / Referral to community resources:  [place]  Patient/Family's Response to care:  Patient and Lisa James    recognize need for rehab before returning home and are agreeable to a SNF in [place]. They report preference for returning to [facility]      . CSW explained insurance authorization process. Patient's family reported that they want patient to get stronger to be able to come back home.    Patient/Family's Understanding of and Emotional Response to Diagnosis, Current Treatment, and Prognosis:  Patient/family is realistic regarding therapy needs and expressed being hopeful for SNF placement. Patient expressed understanding of CSW role and discharge process as well as medical condition. No questions/concerns about plan or treatment.    Emotional Assessment Appearance:  Appears stated age Attitude/Demeanor/Rapport:  Gracious Affect (typically observed):  Accepting, Adaptable Orientation:  Oriented to Self Alcohol / Substance use:  Not Applicable Psych involvement (Current and /or in the community):  No (Comment)  Discharge Needs  Concerns to be addressed:  Discharge Planning Concerns Readmission within the last 30 days:  No Current discharge risk:  Dependent with Mobility Barriers to Discharge:  Continued Medical Work up   [employer], LCSW 07/14/2018, 11:00 AM

## 2018-07-14 NOTE — Progress Notes (Signed)
PROGRESS NOTE    Lisa James  HYW:[id number] DOB: 12-20-1927 DOA: 07/12/2018 PCP: Merrilee Seashore, MD    Brief Narrative:  Lisa James is a 84 y.o. female with medical history significant of dementia, type 2 diabetes mellitus, hypertension, chronic systolic heart failure, history of coronary artery disease, non-Hodgkin's lymphoma, history of chronic subdural hematoma, chronic thrombocytopenia, was brought to the hospital after a fall from wheelchair.   Assessment & Plan:   Active Problems:   HYPERTENSION, BENIGN   SYSTOLIC HEART FAILURE, CHRONIC   Confusion   Subdural hematoma, acute (HCC)   Diabetes mellitus (HCC)   Non-Hodgkin's lymphoma (HCC)   CAD (coronary artery disease)   Chronic kidney disease (CKD), stage III (moderate) (HCC)   GERD (gastroesophageal reflux disease)   Idiopathic thrombocytopenic purpura (HCC)   Altered mental status   UTI (urinary tract infection)   Closed right hip fracture, initial encounter (HCC)   Protein-calorie malnutrition, severe  Questionable fall with closed right hip fracture No syncope noted. CT of the right hip without contrast ordered and showed Isolated fractures involving the greater trochanter.  Associated intramuscular and subcutaneous hematomas. Pain control.  Orthopedics consulted and recommendations given.  No surgical intervention .  PT/OT eval recommending SNF.  Awaiting bed at SNF.   Abnormal UA acute encephalopathy probable urinary tract infection Rocephin started and urine culture show enterococcus, sensitive to ampicillin.  Changed to amoxicilin to complete the course.    Hypertension Blood pressure parameters well controlled, resume Coreg.   Non-Hodgkin's lymphoma Patient is on Promacta.  Continue the same.   Type 2 diabetes mellitus Diet controlled.  AKI probably secondary to dehydration.  Baseline creatinine between 1 and 1.2.  Urine analysis done.  improving.   Hypokalemia Replace  as needed.  Acute metabolic encephalopathy Probably secondary to AKI and dehydration Improving. Prn ativan for agitation.    Chronic thrombocytopenia Follows up as outpatient.  Platelet counts are 75,000 and stable.   Chronic systolic heart failure She appears to be slightly dehydrated.  Anxiety and restlessness: Po ativan prn.    Anemia of blood loss possibly from the hematoma formation from fall.  S/p 1 unit of prbc transfusion.  Repeat cbc shows hemoglobin of 10.4  DVT prophylaxis: scd's Code Status: full code.  Family Communication: discussed with daughter over the phone.  Disposition Plan: SNF when bed available.    Consultants:   Orthopedics.    Procedures:none.   Antimicrobials: amoxicillin. Subjective: No complaints. rstless and agitated.   Objective: Vitals:   07/14/18 0301 07/14/18 0659 07/14/18 1148 07/14/18 1150  BP: (!) 141/58 (!) 165/84 (!) 167/59 (!) 152/41  Pulse: 65 63 66 65  Resp: 15 18 18 19   Temp: 97.6 F (36.4 C) 97.7 F (36.5 C) 97.6 F (36.4 C)   TempSrc: Oral Axillary Oral   SpO2: 100% 100% 100% 98%    Intake/Output Summary (Last 24 hours) at 07/14/2018 1820 Last data filed at 07/14/2018 1130 Gross per 24 hour  Intake 5878.34 ml  Output 50 ml  Net 5828.34 ml   There were no vitals filed for this visit.  Examination:  General exam: agitated, restless.  Respiratory system: Clear to auscultation. Respiratory effort normal. No wheezing or rhonchi.  Cardiovascular system: S1 & S2 heard, RRR.  Gastrointestinal system: Abdomen is soft NT nd bs+ Central nervous system: confused, slightly agitated and restless.  Extremities: right sided hip bruise.  Skin: No rashes, lesions or ulcers Psychiatry: slightly agitated or restless.     Data  Reviewed: I have personally reviewed following labs and imaging studies  CBC: Recent Labs  Lab 07/10/18 1408 07/12/18 1347 07/13/18 0237 07/14/18 1124  WBC 6.4 9.1 6.6 7.1    NEUTROABS 4.2 6.7  --   --   HGB 12.2 9.7* 7.6* 10.3*  HCT 35.7* 29.1* 22.1* 29.6*  MCV 100.3* 101.4* 102.3* 101.7*  PLT 75* 108* 75* 95*   Basic Metabolic Panel: Recent Labs  Lab 07/12/18 1347 07/13/18 0237 07/14/18 1124  NA 134* 137 138  K 3.1* 3.6 3.9  CL 97* 103 106  CO2 25 25 24   GLUCOSE 151* 132* 102*  BUN 22 20 18   CREATININE 1.87* 1.73* 1.17*  CALCIUM 8.8* 8.3* 8.0*   GFR: Estimated Creatinine Clearance: 21.5 mL/min (A) (by C-G formula based on SCr of 1.17 mg/dL (H)). Liver Function Tests: Recent Labs  Lab 07/12/18 1347  AST 22  ALT 13  ALKPHOS 62  BILITOT 1.1  PROT 5.0*  ALBUMIN 3.2*   No results for input(s): LIPASE, AMYLASE in the last 168 hours. No results for input(s): AMMONIA in the last 168 hours. Coagulation Profile: No results for input(s): INR, PROTIME in the last 168 hours. Cardiac Enzymes: No results for input(s): CKTOTAL, CKMB, CKMBINDEX, TROPONINI in the last 168 hours. BNP (last 3 results) No results for input(s): PROBNP in the last 8760 hours. HbA1C: No results for input(s): HGBA1C in the last 72 hours. CBG: No results for input(s): GLUCAP in the last 168 hours. Lipid Profile: No results for input(s): CHOL, HDL, LDLCALC, TRIG, CHOLHDL, LDLDIRECT in the last 72 hours. Thyroid Function Tests: No results for input(s): TSH, T4TOTAL, FREET4, T3FREE, THYROIDAB in the last 72 hours. Anemia Panel: No results for input(s): VITAMINB12, FOLATE, FERRITIN, TIBC, IRON, RETICCTPCT in the last 72 hours. Sepsis Labs: No results for input(s): PROCALCITON, LATICACIDVEN in the last 168 hours.  Recent Results (from the past 240 hour(s))  Urine culture     Status: Abnormal   Collection Time: 07/12/18  3:42 PM  Result Value Ref Range Status   Specimen Description URINE, RANDOM  Final   Special Requests   Final    NONE Performed at [facility], [address], [place], [place] [address]    Culture 30,000 COLONIES/mL ENTEROCOCCUS FAECALIS (A)   Final   Report Status 07/14/2018 FINAL  Final   Organism ID, Bacteria ENTEROCOCCUS FAECALIS (A)  Final      Susceptibility   Enterococcus faecalis - MIC*    AMPICILLIN <=2 SENSITIVE Sensitive     LEVOFLOXACIN 0.5 SENSITIVE Sensitive     NITROFURANTOIN <=16 SENSITIVE Sensitive     VANCOMYCIN 1 SENSITIVE Sensitive     * 30,000 COLONIES/mL ENTEROCOCCUS FAECALIS         Radiology Studies: Ct Hip Right Wo Contrast  Result Date: 07/13/2018 CLINICAL DATA:  [place].  Suspected hip fracture on plain films. EXAM: CT OF THE RIGHT HIP WITHOUT CONTRAST TECHNIQUE: Multidetector CT imaging of the right hip was performed according to the standard protocol. Multiplanar CT image reconstructions were also generated. COMPARISON:  Radiographs 07/12/2018 FINDINGS: There are relatively nondisplaced fractures of the greater trochanter. I do not see an intertrochanteric fracture or femoral neck fracture. The acetabulum is intact. No iliac or sacral fractures are identified. Moderate degenerative changes are noted at the pubic symphysis. No pubic rami fractures. No significant intrapelvic abnormalities. Advanced atherosclerotic calcifications are noted. Moderate-sized hematoma noted in the subcutaneous tissues superficial to the muscle fascia. There is surrounding soft tissue swelling/edema/hemorrhage. Intermuscular  hemorrhage is also suggested in the gluteus muscles. IMPRESSION: 1. Isolated fractures involving the greater trochanter. I do not see an intertrochanteric component or involvement of the femoral neck. 2. Associated intramuscular and subcutaneous hematomas. 3. No pelvic fractures. Electronically Signed   By: Marijo Sanes M.D.   On: 07/13/2018 07:29        Scheduled Meds: . amoxicillin  500 mg Oral Q12H  . carvedilol  6.25 mg Oral BID WC  . docusate sodium  100 mg Oral BID  . eltrombopag  100 mg Oral Daily  . feeding supplement (ENSURE ENLIVE)  237 mL Oral TID BM  . nystatin   Topical TID  .  sertraline  25 mg Oral Daily   Continuous Infusions:    LOS: 2 days    Time spent: 32 minutes    Hosie Poisson, MD [facility] Pager [phone]  If 7PM-7AM, please contact night-coverage [email or web address] Password [email or web address] 07/14/2018, 6:20 PM

## 2018-07-14 NOTE — Progress Notes (Signed)
TRH 1 notified of pt status.  Pt restless, up and down to [facility] several times with small amts of urine. Perineum is red and pt is scratching area. Bladder scan done with volume of 108.

## 2018-07-14 NOTE — Evaluation (Signed)
Occupational Therapy Evaluation Patient Details Name: Lisa James MRN: [id number] DOB: 1928-04-01 Today's Date: 07/14/2018    History of Present Illness 84 y.o. female with medical history significant of dementia, type 2 diabetes mellitus, hypertension, chronic systolic heart failure, history of coronary artery disease, non-Hodgkin's lymphoma, history of chronic subdural hematoma, chronic thrombocytopenia, was brought to the hospital after a fall from wheelchair.  Xrays revealed Closed fracture of neck of right femur which can be treated non-operatively with WBAT. Patient is confused and unable to provide history. Per notes pt is resident at [facility] facility   Clinical Impression   Per chart review, pt was at [facility] unit and requires assistance for ADLs. Pt currently performing ADLs and functional mobility with Min A for balance and safety as well as Max cues for cognition. Pt very eager to move and perform OOB activity. Pt would benefit from further acute OT to facilitate safe dc. Recommend dc to SNF for further OT to optimize safety, independence with ADLs, and return to PLOF.      Follow Up Recommendations  SNF;Supervision/Assistance - 24 hour(Return to [facility] care with increased supervision)    Equipment Recommendations  None recommended by OT    Recommendations for Other Services PT consult     Precautions / Restrictions Precautions Precautions: Fall Precaution Comments: fx result of fall Restrictions Weight Bearing Restrictions: Yes RLE Weight Bearing: Weight bearing as tolerated      Mobility Bed Mobility Overal bed mobility: Needs Assistance Bed Mobility: Supine to Sit     Supine to sit: Min guard     General bed mobility comments: Min guard A for safety. Pt eager to get OOB and perform toileting  Transfers Overall transfer level: Needs assistance Equipment used: Rolling walker (2 wheeled) Transfers: Sit to/from Stand Sit to  Stand: Min assist         General transfer comment: Min A for posterior lean and balance    Balance Overall balance assessment: Needs assistance Sitting-balance support: Feet supported;No upper extremity supported Sitting balance-Leahy Scale: Fair     Standing balance support: Bilateral upper extremity supported;During functional activity Standing balance-Leahy Scale: Poor                             ADL either performed or assessed with clinical judgement   ADL Overall ADL's : Needs assistance/impaired     Grooming: Minimal assistance;Standing Grooming Details (indicate cue type and reason): Min A for standing balance and posterior lean Upper Body Bathing: Minimal assistance;Sitting   Lower Body Bathing: Minimal assistance;Sit to/from stand   Upper Body Dressing : Minimal assistance;Sitting   Lower Body Dressing: Minimal assistance;Sit to/from stand   Toilet Transfer: Minimal Teacher, English as a foreign language;Ambulation;RW   Toileting- Clothing Manipulation and Hygiene: Minimal assistance;Sit to/from stand Toileting - Clothing Manipulation Details (indicate cue type and reason): Min A to manage gown.     Functional mobility during ADLs: Minimal assistance;Rolling walker General ADL Comments: Due to cognitive deficits, pt requiring Max cues for performance of ADLs. Pt able to perform functional mobility to bathroom to complete toileting with Min A. Pt performing hand hygiene at sink with Min A.      Vision         Perception     Praxis      Pertinent Vitals/Pain Pain Assessment: Faces Faces Pain Scale: No hurt Pain Intervention(s): Monitored during session     Hand Dominance Right  Extremity/Trunk Assessment Upper Extremity Assessment Upper Extremity Assessment: Generalized weakness   Lower Extremity Assessment Lower Extremity Assessment: RLE deficits/detail RLE Deficits / Details: right proximal femur fracture and non-operatively with WBAT        Communication Communication Communication: HOH;Expressive difficulties   Cognition Arousal/Alertness: Awake/alert Behavior During Therapy: Impulsive Overall Cognitive Status: History of cognitive impairments - at baseline                                 General Comments: Baseline dementia   General Comments  Sitter present throughout session    Exercises     Shoulder Instructions      Home Living Family/patient expects to be discharged to:: Skilled nursing facility                                 Additional Comments: Per chart review, pt is from [facility].       Prior Functioning/Environment Level of Independence: Needs assistance        Comments: Per chart review, pt requires assistance with ADLs and uses w/c        OT Problem List: Decreased activity tolerance;Impaired balance (sitting and/or standing);Decreased cognition;Decreased safety awareness;Decreased knowledge of use of DME or AE;Decreased knowledge of precautions;Pain      OT Treatment/Interventions: Self-care/ADL training;Therapeutic exercise;Energy conservation;DME and/or AE instruction;Therapeutic activities;Patient/family education    OT Goals(Current goals can be found in the care plan section) Acute Rehab OT Goals Patient Stated Goal: "get home" OT Goal Formulation: With patient Time For Goal Achievement: 07/28/18 Potential to Achieve Goals: Good ADL Goals Pt Will Perform Grooming: with supervision;standing;with set-up Pt Will Perform Lower Body Dressing: with set-up;with supervision;sit to/from stand Pt Will Transfer to Toilet: with set-up;with supervision;ambulating;bedside commode Pt Will Perform Toileting - Clothing Manipulation and hygiene: with set-up;with supervision;sit to/from stand  OT Frequency: Min 2X/week   Barriers to D/C:            Co-evaluation              AM-PAC OT "6 Clicks" Daily Activity     Outcome Measure Help from another  person eating meals?: A Lot Help from another person taking care of personal grooming?: A Lot Help from another person toileting, which includes using toliet, bedpan, or urinal?: A Lot Help from another person bathing (including washing, rinsing, drying)?: A Lot Help from another person to put on and taking off regular upper body clothing?: A Lot Help from another person to put on and taking off regular lower body clothing?: A Lot 6 Click Score: 12   End of Session Equipment Utilized During Treatment: Gait belt;Rolling walker Nurse Communication: Mobility status  Activity Tolerance: Patient tolerated treatment well Patient left: in chair;with call bell/phone within reach;with chair alarm set;with nursing/sitter in room  OT Visit Diagnosis: Unsteadiness on feet (R26.81);Other abnormalities of gait and mobility (R26.89);Muscle weakness (generalized) (M62.81);Other symptoms and signs involving cognitive function;Pain;History of falling (Z91.81) Pain - Right/Left: Right Pain - part of body: Leg                Time: 0254-2706 OT Time Calculation (min): 16 min Charges:  OT General Charges $OT Visit: 1 Visit OT Evaluation $OT Eval Moderate Complexity: [place], OTR/L Acute Rehab Pager: [phone] Office: [place] 07/14/2018, 2:06 PM

## 2018-07-14 NOTE — Plan of Care (Signed)

## 2018-07-15 DIAGNOSIS — N183 Chronic kidney disease, stage 3 (moderate): Secondary | ICD-10-CM | POA: Diagnosis not present

## 2018-07-15 DIAGNOSIS — R410 Disorientation, unspecified: Secondary | ICD-10-CM | POA: Diagnosis not present

## 2018-07-15 MED ORDER — HALOPERIDOL LACTATE 5 MG/ML IJ SOLN: 1.0000 mg | Freq: Four times a day (QID) | INTRAMUSCULAR | Status: DC | PRN

## 2018-07-15 NOTE — Plan of Care (Signed)
  Problem: Education: Goal: Knowledge of General Education information will improve Description Including pain rating scale, medication(s)/side effects and non-pharmacologic comfort measures Outcome: Progressing   Problem: Clinical Measurements: Goal: Will remain free from infection Outcome: Progressing   Problem: Coping: Goal: Level of anxiety will decrease Outcome: Progressing   Problem: Pain Managment: Goal: General experience of comfort will improve Outcome: Progressing   Problem: Nutrition: Goal: Adequate nutrition will be maintained Outcome: Not Progressing

## 2018-07-15 NOTE — Progress Notes (Signed)
PROGRESS NOTE    Lisa James  PZW:[id number] DOB: March 11, 1928 DOA: 07/12/2018 PCP: Merrilee Seashore, MD    Brief Narrative:  Lisa James is a 84 y.o. female with medical history significant of dementia, type 2 diabetes mellitus, hypertension, chronic systolic heart failure, history of coronary artery disease, non-Hodgkin's lymphoma, history of chronic subdural hematoma, chronic thrombocytopenia, was brought to the hospital after a fall from wheelchair.   Assessment & Plan:   Active Problems:   HYPERTENSION, BENIGN   SYSTOLIC HEART FAILURE, CHRONIC   Confusion   Subdural hematoma, acute (HCC)   Diabetes mellitus (HCC)   Non-Hodgkin's lymphoma (HCC)   CAD (coronary artery disease)   Chronic kidney disease (CKD), stage III (moderate) (HCC)   GERD (gastroesophageal reflux disease)   Idiopathic thrombocytopenic purpura (HCC)   Altered mental status   UTI (urinary tract infection)   Closed right hip fracture, initial encounter (HCC)   Protein-calorie malnutrition, severe  Questionable fall with closed right hip fracture No syncope noted. CT of the right hip without contrast ordered and showed Isolated fractures involving the greater trochanter.  Associated intramuscular and subcutaneous hematomas. Pain control.  Orthopedics consulted and recommendations given.  No surgical intervention .  PT/OT eval recommending SNF.  Awaiting bed at SNF.   Acute encephalopathy from  probable urinary tract infection Rocephin started and urine culture show enterococcus, sensitive to ampicillin.  Changed to amoxicilin to complete the course.    Hypertension Blood pressure parameters well controlled, resume Coreg.   Non-Hodgkin's lymphoma Patient is on Promacta.  Continue the same.   Type 2 diabetes mellitus Diet controlled. CBG (last 3)  No results for input(s): GLUCAP in the last 72 hours.    AKI probably secondary to dehydration.  Baseline creatinine between 1  and 1.2.    improving.   Hypokalemia Replace as needed.  Acute metabolic encephalopathy Probably secondary to AKI and dehydration and UTI.  Improving. Prn ativan for agitation.    Chronic thrombocytopenia 95,000 AND STABLE.  Follows up as outpatient.    Chronic systolic heart failure Compensated.   Anxiety and restlessness: Po ativan prn.     acute Anemia of blood loss possibly from the hematoma formation from fall.  S/p 1 unit of prbc transfusion.  Repeat cbc shows hemoglobin of 10.4  DVT prophylaxis: scd's Code Status: full code.  Family Communication: discussed with daughter over the phone.  Disposition Plan: SNF when bed available.    Consultants:   Orthopedics.    Procedures:none.   Antimicrobials: amoxicillin. Subjective: Continues to be confused and restless.   Objective: Vitals:   07/14/18 1148 07/14/18 1150 07/14/18 2202 07/15/18 0551  BP: (!) 167/59 (!) 152/41 137/60 (!) 151/85  Pulse: 66 65 76 85  Resp: 18 19 19 14   Temp: 97.6 F (36.4 C)  98.9 F (37.2 C) 98.7 F (37.1 C)  TempSrc: Oral  Oral Oral  SpO2: 100% 98% 98% 99%    Intake/Output Summary (Last 24 hours) at 07/15/2018 1127 Last data filed at 07/15/2018 0900 Gross per 24 hour  Intake 580 ml  Output -  Net 580 ml   There were no vitals filed for this visit.  Examination:  General exam: agitated, restless.  Respiratory system: clear to auscultation bilaterally.  Cardiovascular system: S1 & S2 heard, RRR.  Gastrointestinal system: Abdomen is soft non tender non distended bowel sounds heard.  Central nervous system: confused, slightly agitated and restless.  Extremities: right sided hip bruise.  Skin: No rashes, lesions or  ulcers Psychiatry: slightly agitated or restless.     Data Reviewed: I have personally reviewed following labs and imaging studies  CBC: Recent Labs  Lab 07/10/18 1408 07/12/18 1347 07/13/18 0237 07/14/18 1124  WBC 6.4 9.1 6.6 7.1    NEUTROABS 4.2 6.7  --   --   HGB 12.2 9.7* 7.6* 10.3*  HCT 35.7* 29.1* 22.1* 29.6*  MCV 100.3* 101.4* 102.3* 101.7*  PLT 75* 108* 75* 95*   Basic Metabolic Panel: Recent Labs  Lab 07/12/18 1347 07/13/18 0237 07/14/18 1124  NA 134* 137 138  K 3.1* 3.6 3.9  CL 97* 103 106  CO2 25 25 24   GLUCOSE 151* 132* 102*  BUN 22 20 18   CREATININE 1.87* 1.73* 1.17*  CALCIUM 8.8* 8.3* 8.0*   GFR: Estimated Creatinine Clearance: 21.5 mL/min (A) (by C-G formula based on SCr of 1.17 mg/dL (H)). Liver Function Tests: Recent Labs  Lab 07/12/18 1347  AST 22  ALT 13  ALKPHOS 62  BILITOT 1.1  PROT 5.0*  ALBUMIN 3.2*   No results for input(s): LIPASE, AMYLASE in the last 168 hours. No results for input(s): AMMONIA in the last 168 hours. Coagulation Profile: No results for input(s): INR, PROTIME in the last 168 hours. Cardiac Enzymes: No results for input(s): CKTOTAL, CKMB, CKMBINDEX, TROPONINI in the last 168 hours. BNP (last 3 results) No results for input(s): PROBNP in the last 8760 hours. HbA1C: No results for input(s): HGBA1C in the last 72 hours. CBG: No results for input(s): GLUCAP in the last 168 hours. Lipid Profile: No results for input(s): CHOL, HDL, LDLCALC, TRIG, CHOLHDL, LDLDIRECT in the last 72 hours. Thyroid Function Tests: No results for input(s): TSH, T4TOTAL, FREET4, T3FREE, THYROIDAB in the last 72 hours. Anemia Panel: No results for input(s): VITAMINB12, FOLATE, FERRITIN, TIBC, IRON, RETICCTPCT in the last 72 hours. Sepsis Labs: No results for input(s): PROCALCITON, LATICACIDVEN in the last 168 hours.  Recent Results (from the past 240 hour(s))  Urine culture     Status: Abnormal   Collection Time: 07/12/18  3:42 PM  Result Value Ref Range Status   Specimen Description URINE, RANDOM  Final   Special Requests   Final    NONE Performed at [facility], [address] [place], [place] [address]    Culture 30,000 COLONIES/mL ENTEROCOCCUS FAECALIS (A)   Final   Report Status 07/14/2018 FINAL  Final   Organism ID, Bacteria ENTEROCOCCUS FAECALIS (A)  Final      Susceptibility   Enterococcus faecalis - MIC*    AMPICILLIN <=2 SENSITIVE Sensitive     LEVOFLOXACIN 0.5 SENSITIVE Sensitive     NITROFURANTOIN <=16 SENSITIVE Sensitive     VANCOMYCIN 1 SENSITIVE Sensitive     * 30,000 COLONIES/mL ENTEROCOCCUS FAECALIS         Radiology Studies: No results found.      Scheduled Meds: . amoxicillin  500 mg Oral Q12H  . carvedilol  6.25 mg Oral BID WC  . docusate sodium  100 mg Oral BID  . eltrombopag  100 mg Oral Daily  . feeding supplement (ENSURE ENLIVE)  237 mL Oral TID BM  . nystatin   Topical TID  . sertraline  25 mg Oral Daily   Continuous Infusions:    LOS: 3 days    Time spent: 32 minutes    Hosie Poisson, MD [facility] Pager [phone]  If 7PM-7AM, please contact night-coverage [email or web address] Password [facility] 07/15/2018, 11:27 AM

## 2018-07-16 ENCOUNTER — Ambulatory Visit: Payer: Medicare Other

## 2018-07-16 ENCOUNTER — Ambulatory Visit: Payer: Medicare Other | Admitting: Hematology

## 2018-07-16 ENCOUNTER — Other Ambulatory Visit: Payer: Medicare Other

## 2018-07-16 MED ORDER — ENSURE ENLIVE PO LIQD: 237.0000 mL | Freq: Three times a day (TID) | ORAL | 12 refills | Status: DC

## 2018-07-16 MED FILL — PROMACTA 75 MG TABS: 75 | 30 days supply | Qty: 30 | Fill #0

## 2018-07-16 NOTE — Discharge Summary (Signed)
Physician Discharge Summary  Lisa James IHK:[id number] DOB: May 02, 1928 DOA: 07/12/2018  PCP: Merrilee Seashore, MD  Admit date: 07/12/2018 Discharge date: 07/16/2018  Admitted From: Memory care unit.  Disposition:SNF  Recommendations for Outpatient Follow-up:  1. Follow up with PCP in 1-2 weeks 2. Please obtain BMP/CBC in one week 3. Please follow up with Dr Renard Hamper man as needed.     Discharge Condition:Guarded.  CODE STATUS: full code.  Diet recommendation: Heart Healthy   Brief/Interim Summary: Lisa Prentiss Tuttleis a 84 y.o.femalewith medical history significant ofdementia, type 2 diabetes mellitus, hypertension, chronic systolic heart failure, history of coronary artery disease, non-Hodgkin's lymphoma, history of chronic subdural hematoma, chronic thrombocytopenia, was brought to the hospital after a fall from wheelchair.   Discharge Diagnoses:  Active Problems:   HYPERTENSION, BENIGN   SYSTOLIC HEART FAILURE, CHRONIC   Confusion   Subdural hematoma, acute (HCC)   Diabetes mellitus (HCC)   Non-Hodgkin's lymphoma (HCC)   CAD (coronary artery disease)   Chronic kidney disease (CKD), stage III (moderate) (HCC)   GERD (gastroesophageal reflux disease)   Idiopathic thrombocytopenic purpura (HCC)   Altered mental status   UTI (urinary tract infection)   Closed right hip fracture, initial encounter (HCC)   Protein-calorie malnutrition, severe  Questionable fall with closed right hip fracture No syncope noted. CT of the right hip without contrast ordered and showed Isolated fractures involving the greater trochanter.  Associated intramuscular and subcutaneous hematomas. Pain control. Orthopedics consulted and recommendations given.  No surgical intervention .  PT/OT eval recommending SNF.  Awaiting bed at SNF.   Acute encephalopathy from  probable urinary tract infection Rocephin started and urine culture show enterococcus, sensitive to ampicillin.  Changed  to amoxicilin to complete the course.    Hypertension Blood pressure parameters well controlled, resume Coreg.   Non-Hodgkin's lymphoma Patient is on Promacta. Continue the same.   Type 2 diabetes mellitus Diet controlled.   AKI probably secondary to dehydration. Baseline creatinine between 1 and 1.2.  improving.   Hypokalemia Replace as needed.  Acute metabolic encephalopathy Probably secondary to AKI and dehydration and UTI.  Improving. Prn ativan for agitation.    Chronic thrombocytopenia 95,000 AND stable.     Chronic systolic heart failure Compensated.   Anxiety and restlessness: Improved.     acute Anemia of blood loss possibly from the hematoma formation from fall.  S/p 1 unit of prbc transfusion.  Repeat cbc shows hemoglobin of 10.4   Discharge Instructions  Discharge Instructions    Diet - low sodium heart healthy   Complete by:  As directed    Discharge instructions   Complete by:  As directed    Follow up with orthopedics as recommended.     Allergies as of 07/16/2018      Reactions   Ace Inhibitors Cough      Medication List    TAKE these medications   atorvastatin 20 MG tablet Commonly known as:  LIPITOR TAKE 1 TABLET (20 MG TOTAL) BY MOUTH DAILY. What changed:  See the new instructions.   calcium-vitamin D 500-200 MG-UNIT tablet Take 1 tablet by mouth daily.   carvedilol 6.25 MG tablet Commonly known as:  COREG Take 1 tablet (6.25 mg total) by mouth 2 (two) times daily with a meal.   docusate sodium 100 MG capsule Commonly known as:  COLACE Take 1 capsule (100 mg total) by mouth 2 (two) times daily.   eltrombopag 50 MG tablet Commonly known as:  PROMACTA Take 100  mg by mouth daily. Take on an empty stomach 1 hour before a meal or 2 hours after   eltrombopag 75 MG tablet Commonly known as:  PROMACTA Take 1 tablet (75 mg total) by mouth daily. Take on an empty stomach 1 hour before meals or 2 hours  after.   feeding supplement (ENSURE ENLIVE) Liqd Take 237 mLs by mouth 3 (three) times daily between meals.   HYDROcodone-acetaminophen 5-325 MG tablet Commonly known as:  NORCO/VICODIN Take 1 tablet by mouth every 6 (six) hours as needed (for pain).   LACTOBACILLUS PO Take 2 capsules by mouth 3 (three) times daily.   methocarbamol 500 MG tablet Commonly known as:  ROBAXIN Take 1 tablet (500 mg total) by mouth every 6 (six) hours as needed for muscle spasms.   OVER THE COUNTER MEDICATION Aquacel Dressing: "Maintain to right shoulder until ortho follow-up"   PRESERVISION AREDS PO Take 1 tablet by mouth daily.   sertraline 25 MG tablet Commonly known as:  ZOLOFT Take 25 mg by mouth daily.   spironolactone 25 MG tablet Commonly known as:  ALDACTONE Take 25 mg by mouth daily.   traZODone 50 MG tablet Commonly known as:  DESYREL Take 50-100 mg by mouth at bedtime as needed for sleep (for 14 days- until 06/15/18).   Vitamin D 1000 units capsule Take 1,000 Units by mouth at bedtime.   ZEASORB powder Generic drug:  talc Apply 1 application topically every 12 (twelve) hours as needed (UNDER BREASTS, for irritation).      Follow-up Information    Merrilee Seashore, MD. Schedule an appointment as soon as possible for a visit in 1 week(s).   Specialty:  Internal Medicine Contact information: [place] [address] [phone]          Allergies  Allergen Reactions  . Ace Inhibitors Cough    Consultations:  Orthopedics Dr Ninfa Linden.   Procedures/Studies: Dg Shoulder Right  Result Date: 07/12/2018 CLINICAL DATA:  [address] [address] twice today EXAM: RIGHT SHOULDER - 2+ VIEW COMPARISON:  05/28/2018 FINDINGS: Osseous demineralization. AC joint alignment normal. Prior ORIF proximal RIGHT humerus. Again identified small displaced bone fragments inferior to the glenohumeral joint. Hardware appears intact. No additional fracture, dislocation or bone  destruction. Visualized RIGHT ribs intact. IMPRESSION: Postsurgical changes of ORIF proximal RIGHT humerus. Osseous demineralization without new abnormalities. Electronically Signed   By: Lavonia Dana M.D.   On: 07/12/2018 13:36   Ct Head Wo Contrast  Result Date: 07/12/2018 CLINICAL DATA:  Fall.  Head injury. EXAM: CT HEAD WITHOUT CONTRAST CT CERVICAL SPINE WITHOUT CONTRAST TECHNIQUE: Multidetector CT imaging of the head and cervical spine was performed following the standard protocol without intravenous contrast. Multiplanar CT image reconstructions of the cervical spine were also generated. 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 This measures approximately 23 mm in thickness. Mass-effect on the left cerebral hemisphere with 6 mm midline shift to the right, unchanged. Generalized atrophy.  Negative for hydrocephalus.  No acute infarct. Vascular: Calcified distal vertebral arteries and cavernous carotid bilaterally. Negative for hyperdense vessel Skull: Negative Sinuses/Orbits: Paranasal sinuses clear bilaterally. Bilateral cataract surgery Other: None CT CERVICAL SPINE FINDINGS Alignment: 3 mm anterolisthesis C4-5. Mild anterolisthesis C7-T1, T1-2, T2-3 Skull base and vertebrae: Negative for fracture Soft tissues and spinal canal: Extensive atherosclerotic calcification. No soft tissue mass in the neck. Disc levels: Multilevel disc and facet degeneration throughout the  cervical spine. Mild spinal stenosis at C3-4. Mild foraminal narrowing at multiple levels due to spurring. Upper chest: Lung apices clear bilaterally. Other: None IMPRESSION: 1. No acute intracranial abnormality 2. 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 Negative for cervical spine fracture. Multilevel degenerative changes spurring.  Electronically Signed   By: Franchot Gallo M.D.   On: 07/12/2018 13:16   Ct Head Wo Contrast  Result Date: 06/25/2018 CLINICAL DATA:  Fall, hit head on toilet. EXAM: CT HEAD WITHOUT CONTRAST CT CERVICAL SPINE WITHOUT CONTRAST TECHNIQUE: Multidetector CT imaging of the head and cervical spine was performed following the standard protocol without intravenous contrast. Multiplanar CT image reconstructions of the cervical spine were also generated. COMPARISON:  05/14/2017 FINDINGS: CT HEAD FINDINGS Brain: Large chronic calcified extra-axial subdural collection again noted measuring 3 cm in thickness. This is unchanged dating back to prior study and dating back to old studies from 2016. This has mass effect on the left cerebral hemisphere and 7 mm left right midline shift, stable. No acute hemorrhage or hydrocephalus. No acute infarction. Vascular: No hyperdense vessel or unexpected calcification. Skull: No acute calvarial abnormality. Sinuses/Orbits: Visualized paranasal sinuses and mastoids clear. Orbital soft tissues unremarkable. Other: Soft tissue swelling over the left occipital region. CT CERVICAL SPINE FINDINGS Alignment: Slight subluxation of C4 on C5 related to facet disease. Skull base and vertebrae: No acute fracture. No primary bone lesion or focal pathologic process. Soft tissues and spinal canal: No prevertebral fluid or swelling. No visible canal hematoma. Disc levels: Severe diffuse degenerative disc and facet disease throughout the cervical spine. Upper chest: No acute findings Other: Carotid artery calcifications. IMPRESSION: Stable large calcified subdural collection in the left cerebral hemisphere with stable mass effect and midline shift dating back to 2016. No acute intracranial abnormality. Diffuse cervical spondylosis. No acute bony abnormality in the cervical spine. Electronically Signed   By: Rolm Baptise M.D.   On: 06/25/2018 21:08   Ct Cervical Spine Wo Contrast  Result Date:  07/12/2018 CLINICAL DATA:  Fall.  Head injury. EXAM: CT HEAD WITHOUT CONTRAST CT CERVICAL SPINE WITHOUT CONTRAST TECHNIQUE: Multidetector CT imaging of the head and cervical spine was performed following the standard protocol without intravenous contrast. Multiplanar CT image reconstructions of the cervical spine were also generated. 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 This measures approximately 23 mm in thickness. Mass-effect on the left cerebral hemisphere with 6 mm midline shift to the right, unchanged. Generalized atrophy.  Negative for hydrocephalus.  No acute infarct. Vascular: Calcified distal vertebral arteries and cavernous carotid bilaterally. Negative for hyperdense vessel Skull: Negative Sinuses/Orbits: Paranasal sinuses clear bilaterally. Bilateral cataract surgery Other: None CT CERVICAL SPINE FINDINGS Alignment: 3 mm anterolisthesis C4-5. Mild anterolisthesis C7-T1, T1-2, T2-3 Skull base and vertebrae: Negative for fracture Soft tissues and spinal canal: Extensive atherosclerotic calcification. No soft tissue mass in the neck. Disc levels: Multilevel disc and facet degeneration throughout the cervical spine. Mild spinal stenosis at C3-4. Mild foraminal narrowing at multiple levels due to spurring. Upper chest: Lung apices clear bilaterally. Other: None IMPRESSION: 1. No acute intracranial abnormality 2. 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 Negative for cervical spine fracture. Multilevel degenerative changes spurring. Electronically Signed   By: Franchot Gallo M.D.  On: 07/12/2018 13:16   Ct Cervical Spine Wo Contrast  Result Date: 06/25/2018 CLINICAL DATA:  Fall, hit head on toilet. EXAM: CT HEAD WITHOUT CONTRAST CT CERVICAL SPINE WITHOUT CONTRAST TECHNIQUE: Multidetector CT  imaging of the head and cervical spine was performed following the standard protocol without intravenous contrast. Multiplanar CT image reconstructions of the cervical spine were also generated. COMPARISON:  05/14/2017 FINDINGS: CT HEAD FINDINGS Brain: Large chronic calcified extra-axial subdural collection again noted measuring 3 cm in thickness. This is unchanged dating back to prior study and dating back to old studies from 2016. This has mass effect on the left cerebral hemisphere and 7 mm left right midline shift, stable. No acute hemorrhage or hydrocephalus. No acute infarction. Vascular: No hyperdense vessel or unexpected calcification. Skull: No acute calvarial abnormality. Sinuses/Orbits: Visualized paranasal sinuses and mastoids clear. Orbital soft tissues unremarkable. Other: Soft tissue swelling over the left occipital region. CT CERVICAL SPINE FINDINGS Alignment: Slight subluxation of C4 on C5 related to facet disease. Skull base and vertebrae: No acute fracture. No primary bone lesion or focal pathologic process. Soft tissues and spinal canal: No prevertebral fluid or swelling. No visible canal hematoma. Disc levels: Severe diffuse degenerative disc and facet disease throughout the cervical spine. Upper chest: No acute findings Other: Carotid artery calcifications. IMPRESSION: Stable large calcified subdural collection in the left cerebral hemisphere with stable mass effect and midline shift dating back to 2016. No acute intracranial abnormality. Diffuse cervical spondylosis. No acute bony abnormality in the cervical spine. Electronically Signed   By: Rolm Baptise M.D.   On: 06/25/2018 21:08   Ct Hip Right Wo Contrast  Result Date: 07/13/2018 CLINICAL DATA:  [place] [address].  Suspected hip fracture on plain films. EXAM: CT OF THE RIGHT HIP WITHOUT CONTRAST TECHNIQUE: Multidetector CT imaging of the right hip was performed according to the standard protocol. Multiplanar CT image reconstructions were also  generated. COMPARISON:  Radiographs 07/12/2018 FINDINGS: There are relatively nondisplaced fractures of the greater trochanter. I do not see an intertrochanteric fracture or femoral neck fracture. The acetabulum is intact. No iliac or sacral fractures are identified. Moderate degenerative changes are noted at the pubic symphysis. No pubic rami fractures. No significant intrapelvic abnormalities. Advanced atherosclerotic calcifications are noted. Moderate-sized hematoma noted in the subcutaneous tissues superficial to the muscle fascia. There is surrounding soft tissue swelling/edema/hemorrhage. Intermuscular hemorrhage is also suggested in the gluteus muscles. IMPRESSION: 1. Isolated fractures involving the greater trochanter. I do not see an intertrochanteric component or involvement of the femoral neck. 2. Associated intramuscular and subcutaneous hematomas. 3. No pelvic fractures. Electronically Signed   By: Marijo Sanes M.D.   On: 07/13/2018 07:29   Dg Hip Unilat W Or Wo Pelvis 2-3 Views Right  Result Date: 07/12/2018 CLINICAL DATA:  [place] [address] twice today, RIGHT hip pain EXAM: DG HIP (WITH OR WITHOUT PELVIS) 2-3V RIGHT COMPARISON:  None FINDINGS: Osseous demineralization. Hip and SI joint spaces preserved. Probable nondisplaced intertrochanteric fracture RIGHT femur. No additional fracture or dislocation. Few pelvic phleboliths and scattered atherosclerotic calcifications. Soft tissue swelling laterally overlying the lateral RIGHT hip region. IMPRESSION: Osseous demineralization with a probable nondisplaced intertrochanteric fracture of the RIGHT femur. Electronically Signed   By: Lavonia Dana M.D.   On: 07/12/2018 13:40   Xr Shoulder Right  Result Date: 06/24/2018 AP lateral right shoulder reviewed.  Hardware unchanged in appearance and position.  No evidence of loosening.  Shoulder is reduced.      Subjective:  No new complaints.  Discharge Exam: Vitals:   07/15/18 1928 07/16/18 0430  BP: (!)  156/65 (!) 155/69  Pulse: 92 71  Resp: 18 16  Temp: 98.2 F (36.8 C) 98.3 F (36.8 C)  SpO2: 100% 100%   Vitals:   07/15/18 0551 07/15/18 1522 07/15/18 1928 07/16/18 0430  BP: (!) 151/85 (!) 149/59 (!) 156/65 (!) 155/69  Pulse: 85 85 92 71  Resp: 14 15 18 16   Temp: 98.7 F (37.1 C) 98.4 F (36.9 C) 98.2 F (36.8 C) 98.3 F (36.8 C)  TempSrc: Oral Oral Oral Oral  SpO2: 99% 98% 100% 100%    General: Pt is alert, awake, not in acute distress Cardiovascular: RRR, S1/S2 +, no rubs, no gallops Respiratory: CTA bilaterally, no wheezing, no rhonchi Abdominal: Soft, NT, ND, bowel sounds + Extremities: no edema, no cyanosis    The results of significant diagnostics from this hospitalization (including imaging, microbiology, ancillary and laboratory) are listed below for reference.     Microbiology: Recent Results (from the past 240 hour(s))  Urine culture     Status: Abnormal   Collection Time: 07/12/18  3:42 PM  Result Value Ref Range Status   Specimen Description URINE, RANDOM  Final   Special Requests   Final    NONE Performed at [facility], [address], [place], [place] [address]    Culture 30,000 COLONIES/mL ENTEROCOCCUS FAECALIS (A)  Final   Report Status 07/14/2018 FINAL  Final   Organism ID, Bacteria ENTEROCOCCUS FAECALIS (A)  Final      Susceptibility   Enterococcus faecalis - MIC*    AMPICILLIN <=2 SENSITIVE Sensitive     LEVOFLOXACIN 0.5 SENSITIVE Sensitive     NITROFURANTOIN <=16 SENSITIVE Sensitive     VANCOMYCIN 1 SENSITIVE Sensitive     * 30,000 COLONIES/mL ENTEROCOCCUS FAECALIS     Labs: BNP (last 3 results) No results for input(s): BNP in the last 8760 hours. Basic Metabolic Panel: Recent Labs  Lab 07/12/18 1347 07/13/18 0237 07/14/18 1124  NA 134* 137 138  K 3.1* 3.6 3.9  CL 97* 103 106  CO2 25 25 24   GLUCOSE 151* 132* 102*  BUN 22 20 18   CREATININE 1.87* 1.73* 1.17*  CALCIUM 8.8* 8.3* 8.0*   Liver Function Tests: Recent  Labs  Lab 07/12/18 1347  AST 22  ALT 13  ALKPHOS 62  BILITOT 1.1  PROT 5.0*  ALBUMIN 3.2*   No results for input(s): LIPASE, AMYLASE in the last 168 hours. No results for input(s): AMMONIA in the last 168 hours. CBC: Recent Labs  Lab 07/10/18 1408 07/12/18 1347 07/13/18 0237 07/14/18 1124  WBC 6.4 9.1 6.6 7.1  NEUTROABS 4.2 6.7  --   --   HGB 12.2 9.7* 7.6* 10.3*  HCT 35.7* 29.1* 22.1* 29.6*  MCV 100.3* 101.4* 102.3* 101.7*  PLT 75* 108* 75* 95*   Cardiac Enzymes: No results for input(s): CKTOTAL, CKMB, CKMBINDEX, TROPONINI in the last 168 hours. BNP: Invalid input(s): POCBNP CBG: No results for input(s): GLUCAP in the last 168 hours. D-Dimer No results for input(s): DDIMER in the last 72 hours. Hgb A1c No results for input(s): HGBA1C in the last 72 hours. Lipid Profile No results for input(s): CHOL, HDL, LDLCALC, TRIG, CHOLHDL, LDLDIRECT in the last 72 hours. Thyroid function studies No results for input(s): TSH, T4TOTAL, T3FREE, THYROIDAB in the last 72 hours.  Invalid input(s): FREET3 Anemia work up No results for input(s): VITAMINB12, FOLATE, FERRITIN, TIBC, IRON, RETICCTPCT in the last 72 hours.  Urinalysis    Component Value Date/Time   COLORURINE YELLOW 07/14/2018 0410   APPEARANCEUR CLEAR 07/14/2018 0410   APPEARANCEUR Hazy 11/19/2012 1750   LABSPEC 1.018 07/14/2018 0410   LABSPEC 1.014 11/19/2012 1750   PHURINE 5.0 07/14/2018 0410   GLUCOSEU NEGATIVE 07/14/2018 0410   GLUCOSEU Negative 11/19/2012 1750   HGBUR NEGATIVE 07/14/2018 0410   BILIRUBINUR NEGATIVE 07/14/2018 0410   BILIRUBINUR negative 05/12/2015 1823   BILIRUBINUR Negative 11/19/2012 1750   KETONESUR 5 (A) 07/14/2018 0410   PROTEINUR NEGATIVE 07/14/2018 0410   UROBILINOGEN 0.2 05/13/2015 0504   NITRITE NEGATIVE 07/14/2018 0410   LEUKOCYTESUR SMALL (A) 07/14/2018 0410   LEUKOCYTESUR 2+ 11/19/2012 1750   Sepsis Labs Invalid input(s): PROCALCITONIN,  WBC,   LACTICIDVEN Microbiology Recent Results (from the past 240 hour(s))  Urine culture     Status: Abnormal   Collection Time: 07/12/18  3:42 PM  Result Value Ref Range Status   Specimen Description URINE, RANDOM  Final   Special Requests   Final    NONE Performed at [facility], [place] [address], [place], [place] [address]    Culture 30,000 COLONIES/mL ENTEROCOCCUS FAECALIS (A)  Final   Report Status 07/14/2018 FINAL  Final   Organism ID, Bacteria ENTEROCOCCUS FAECALIS (A)  Final      Susceptibility   Enterococcus faecalis - MIC*    AMPICILLIN <=2 SENSITIVE Sensitive     LEVOFLOXACIN 0.5 SENSITIVE Sensitive     NITROFURANTOIN <=16 SENSITIVE Sensitive     VANCOMYCIN 1 SENSITIVE Sensitive     * 30,000 COLONIES/mL ENTEROCOCCUS FAECALIS     Time coordinating discharge: 34 minutes  SIGNED:   Hosie Poisson, MD  [facility] 07/16/2018, 10:40 AM Pager   If 7PM-7AM, please contact night-coverage [email or web address] Password [email or web address]

## 2018-07-16 NOTE — Progress Notes (Signed)
Physical Therapy Treatment Patient Details Name: Lisa James MRN: [id number] DOB: [birth year] Today's Date: 07/16/2018    History of Present Illness 84 y.o. female with medical history significant of dementia, type 2 diabetes mellitus, hypertension, chronic systolic heart failure, history of coronary artery disease, non-Hodgkin's lymphoma, history of chronic subdural hematoma, chronic thrombocytopenia, was brought to the hospital after a fall from wheelchair.  Xrays revealed Closed fracture of neck of right femur which can be treated non-operatively with WBAT. Patient is confused and unable to provide history. Per notes pt is resident at [facility] facility    PT Comments    Patient seen for mobility progression. Pt oriented to self only. Pt requires min/mod A for gait training with RW. Current plan remains appropriate.    Follow Up Recommendations  SNF;Supervision/Assistance - 24 hour     Equipment Recommendations  None recommended by PT    Recommendations for Other Services       Precautions / Restrictions Precautions Precautions: Fall Precaution Comments: fx result of fall Restrictions Weight Bearing Restrictions: Yes RLE Weight Bearing: Weight bearing as tolerated    Mobility  Bed Mobility               General bed mobility comments: pt OOB in chair upon arrival  Transfers Overall transfer level: Needs assistance Equipment used: Rolling walker (2 wheeled) Transfers: Sit to/from Stand Sit to Stand: Min assist         General transfer comment: assist to steady  Ambulation/Gait Ambulation/Gait assistance: Mod assist;Min assist Gait Distance (Feet): 200 Feet Assistive device: Rolling walker (2 wheeled) Gait Pattern/deviations: Step-through pattern;Narrow base of support;Trunk flexed;Decreased stance time - right;Decreased step length - left;Decreased step length - right;Decreased weight shift to right;Antalgic Gait velocity: decreased   General  Gait Details: cues and assistance for safe use of AD and navigating environment; pt runs into objects on L side without assistance to guide RW; grossly min A   Stairs             Wheelchair Mobility    Modified Rankin (Stroke Patients Only)       Balance Overall balance assessment: Needs assistance Sitting-balance support: Feet supported;No upper extremity supported Sitting balance-Leahy Scale: Fair     Standing balance support: Bilateral upper extremity supported;During functional activity Standing balance-Leahy Scale: Poor                              Cognition Arousal/Alertness: Awake/alert Behavior During Therapy: WFL for tasks assessed/performed Overall Cognitive Status: History of cognitive impairments - at baseline                                 General Comments: advanced dementia. pt reoriented to place and situation throughout session      Exercises      General Comments        Pertinent Vitals/Pain Pain Assessment: Faces Faces Pain Scale: Hurts little more Pain Location: R hip Pain Descriptors / Indicators: Aching;Moaning Pain Intervention(s): Limited activity within patient's tolerance;Monitored during session;Repositioned    Home Living                      Prior Function            PT Goals (current goals can now be found in the care plan section) Acute Rehab PT Goals Patient Stated Goal: "  get home" Progress towards PT goals: Progressing toward goals    Frequency    Min 3X/week      PT Plan Current plan remains appropriate    Co-evaluation              AM-PAC PT "6 Clicks" Mobility   Outcome Measure  Help needed turning from your back to your side while in a flat bed without using bedrails?: A Little Help needed moving from lying on your back to sitting on the side of a flat bed without using bedrails?: A Little Help needed moving to and from a bed to a chair (including a  wheelchair)?: A Lot Help needed standing up from a chair using your arms (e.g., wheelchair or bedside chair)?: A Little Help needed to walk in hospital room?: A Lot Help needed climbing 3-5 steps with a railing? : Total 6 Click Score: 14    End of Session Equipment Utilized During Treatment: Gait belt Activity Tolerance: Patient tolerated treatment well Patient left: with call bell/phone within reach;in chair;with nursing/sitter in room Nurse Communication: Mobility status PT Visit Diagnosis: Unsteadiness on feet (R26.81);Other abnormalities of gait and mobility (R26.89);Repeated falls (R29.6);Muscle weakness (generalized) (M62.81);History of falling (Z91.81);Difficulty in walking, not elsewhere classified (R26.2)     Time: 4709-2957 PT Time Calculation (min) (ACUTE ONLY): 14 min  Charges:  $Gait Training: 8-22 mins                     Earney Navy, PTA Acute Rehabilitation Services Pager: [phone] Office: [phone]     Darliss Cheney 07/16/2018, 1:25 PM

## 2018-07-16 NOTE — Progress Notes (Signed)
[facility] has received insurance auth, however CSW was notified by nurse that sitter is still present. CSW reiterated that sitter has to be discharged and patient without sitter for 24 hours before going to Skilled Nursing. CSW followed up with [facility] who re stated this information, they are unable to accept patient until 24 hours without sitter.   Nurse notified, will notify Dr. Karleen Hampshire.   [place], [place]

## 2018-07-16 NOTE — Progress Notes (Signed)
Skin tear on right upper arm, bruising, pink and bruising around site. Area cleaned and covered with pink foam. Blanch Media, RN second verifier.

## 2018-07-16 NOTE — Plan of Care (Signed)
  Problem: Safety: Goal: Ability to remain free from injury will improve Outcome: Progressing   

## 2018-07-16 NOTE — Care Management Important Message (Signed)
Important Message  Patient Details  Name: Lisa James MRN: [id number] Date of Birth: 1927-08-21   Medicare Important Message Given:  Yes    Nechama Escutia P Amous Crewe 07/16/2018, 4:59 PM

## 2018-07-16 NOTE — Progress Notes (Addendum)
[facility] d/c'd. Pt is confused and will not stay in bed. Assisted pt to the recliner. Balance is very unsteady. Frequent checks done.

## 2018-07-17 DIAGNOSIS — R410 Disorientation, unspecified: Secondary | ICD-10-CM | POA: Diagnosis not present

## 2018-07-17 NOTE — Discharge Summary (Signed)
Physician Discharge Summary  Lisa James KDT:[id number] DOB: [birth year] DOA: 07/12/2018  PCP: Merrilee Seashore, MD  Admit date: 07/12/2018 Discharge date: 07/17/2018  Admitted From: Memory care unit.  Disposition:SNF  Recommendations for Outpatient Follow-up:  1. Follow up with PCP in 1-2 weeks 2. Please obtain BMP/CBC in one week 3. Please follow up with Dr Renard Hamper man as needed.     Discharge Condition:Guarded.  CODE STATUS: full code.  Diet recommendation: Heart Healthy   Brief/Interim Summary: Lisa Pottle Tuttleis a 84 y.o.femalewith medical history significant ofdementia, type 2 diabetes mellitus, hypertension, chronic systolic heart failure, history of coronary artery disease, non-Hodgkin's lymphoma, history of chronic subdural hematoma, chronic thrombocytopenia, was brought to the hospital after a fall from wheelchair.   07/17/18: Seen and examined at her bedside. No acute events overnight. No new complaints. On the day of discharge the patient was hemodynamicalle stable. Will need to follow up with her PCP post hospitalization.  Discharge Diagnoses:  Active Problems:   HYPERTENSION, BENIGN   SYSTOLIC HEART FAILURE, CHRONIC   Confusion   Subdural hematoma, acute (HCC)   Diabetes mellitus (HCC)   Non-Hodgkin's lymphoma (HCC)   CAD (coronary artery disease)   Chronic kidney disease (CKD), stage III (moderate) (HCC)   GERD (gastroesophageal reflux disease)   Idiopathic thrombocytopenic purpura (HCC)   Altered mental status   UTI (urinary tract infection)   Closed right hip fracture, initial encounter (HCC)   Protein-calorie malnutrition, severe  Questionable fall with closed right hip fracture No syncope noted. CT of the right hip without contrast ordered and showed Isolated fractures involving the greater trochanter.  Associated intramuscular and subcutaneous hematomas. Pain control. Orthopedics consulted and recommendations given.  No surgical  intervention .  PT/OT eval recommending SNF.  Awaiting bed at SNF.   Acute encephalopathy from  probable urinary tract infection Rocephin started and urine culture show enterococcus, sensitive to ampicillin.  Changed to amoxicilin to complete the course.    Hypertension Blood pressure parameters well controlled, resume Coreg.   Non-Hodgkin's lymphoma Patient is on Promacta. Continue the same.   Type 2 diabetes mellitus Diet controlled.   AKI probably secondary to dehydration. Baseline creatinine between 1 and 1.2.  improving.   Hypokalemia Replace as needed.  Acute metabolic encephalopathy Probably secondary to AKI and dehydration and UTI.  Improving. Prn ativan for agitation.    Chronic thrombocytopenia 95,000 AND stable.     Chronic systolic heart failure Compensated.   Anxiety and restlessness: Improved.     acute Anemia of blood loss possibly from the hematoma formation from fall.  S/p 1 unit of prbc transfusion.  Repeat cbc shows hemoglobin of 10.4   Discharge Instructions  Discharge Instructions    Diet - low sodium heart healthy   Complete by:  As directed    Discharge instructions   Complete by:  As directed    Follow up with orthopedics as recommended.     Allergies as of 07/17/2018      Reactions   Ace Inhibitors Cough      Medication List    TAKE these medications   atorvastatin 20 MG tablet Commonly known as:  LIPITOR TAKE 1 TABLET (20 MG TOTAL) BY MOUTH DAILY. What changed:  See the new instructions.   calcium-vitamin D 500-200 MG-UNIT tablet Take 1 tablet by mouth daily.   carvedilol 6.25 MG tablet Commonly known as:  COREG Take 1 tablet (6.25 mg total) by mouth 2 (two) times daily with a meal.  docusate sodium 100 MG capsule Commonly known as:  COLACE Take 1 capsule (100 mg total) by mouth 2 (two) times daily.   eltrombopag 50 MG tablet Commonly known as:  PROMACTA Take 100 mg by mouth daily.  Take on an empty stomach 1 hour before a meal or 2 hours after   eltrombopag 75 MG tablet Commonly known as:  PROMACTA Take 1 tablet (75 mg total) by mouth daily. Take on an empty stomach 1 hour before meals or 2 hours after.   feeding supplement (ENSURE ENLIVE) Liqd Take 237 mLs by mouth 3 (three) times daily between meals.   HYDROcodone-acetaminophen 5-325 MG tablet Commonly known as:  NORCO/VICODIN Take 1 tablet by mouth every 6 (six) hours as needed (for pain).   LACTOBACILLUS PO Take 2 capsules by mouth 3 (three) times daily.   methocarbamol 500 MG tablet Commonly known as:  ROBAXIN Take 1 tablet (500 mg total) by mouth every 6 (six) hours as needed for muscle spasms.   OVER THE COUNTER MEDICATION Aquacel Dressing: "Maintain to right shoulder until ortho follow-up"   PRESERVISION AREDS PO Take 1 tablet by mouth daily.   sertraline 25 MG tablet Commonly known as:  ZOLOFT Take 25 mg by mouth daily.   spironolactone 25 MG tablet Commonly known as:  ALDACTONE Take 25 mg by mouth daily.   traZODone 50 MG tablet Commonly known as:  DESYREL Take 50-100 mg by mouth at bedtime as needed for sleep (for 14 days- until 06/15/18).   Vitamin D 1000 units capsule Take 1,000 Units by mouth at bedtime.   ZEASORB powder Generic drug:  talc Apply 1 application topically every 12 (twelve) hours as needed (UNDER BREASTS, for irritation).       Contact information for follow-up providers    Merrilee Seashore, MD. Schedule an appointment as soon as possible for a visit in 1 week(s).   Specialty:  Internal Medicine Contact information: [address] [place]  [address] [phone]            Contact information for after-discharge care    Destination    [facility] Preferred SNF .   Service:  Skilled Nursing Contact information: [place] [phone]                 Allergies   Allergen Reactions  . Ace Inhibitors Cough    Consultations:  Orthopedics Dr Ninfa Linden.   Procedures/Studies: Dg Shoulder Right  Result Date: 07/12/2018 CLINICAL DATA:  [address] twice today EXAM: RIGHT SHOULDER - 2+ VIEW COMPARISON:  05/28/2018 FINDINGS: Osseous demineralization. AC joint alignment normal. Prior ORIF proximal RIGHT humerus. Again identified small displaced bone fragments inferior to the glenohumeral joint. Hardware appears intact. No additional fracture, dislocation or bone destruction. Visualized RIGHT ribs intact. IMPRESSION: Postsurgical changes of ORIF proximal RIGHT humerus. Osseous demineralization without new abnormalities. Electronically Signed   By: Lavonia Dana M.D.   On: 07/12/2018 13:36   Ct Head Wo Contrast  Result Date: 07/12/2018 CLINICAL DATA:  Fall.  Head injury. EXAM: CT HEAD WITHOUT CONTRAST CT CERVICAL SPINE WITHOUT CONTRAST TECHNIQUE: Multidetector CT imaging of the head and cervical spine was performed following the standard protocol without intravenous contrast. Multiplanar CT image reconstructions of the cervical spine were also generated. 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 This measures approximately 23 mm in thickness. Mass-effect on the  left cerebral hemisphere with 6 mm midline shift to the right, unchanged. Generalized atrophy.  Negative for hydrocephalus.  No acute infarct. Vascular: Calcified distal vertebral arteries and cavernous carotid bilaterally. Negative for hyperdense vessel Skull: Negative Sinuses/Orbits: Paranasal sinuses clear bilaterally. Bilateral cataract surgery Other: None CT CERVICAL SPINE FINDINGS Alignment: 3 mm anterolisthesis C4-5. Mild anterolisthesis C7-T1, T1-2, T2-3 Skull base and vertebrae: Negative for fracture Soft tissues and spinal canal: Extensive atherosclerotic calcification. No soft tissue mass  in the neck. Disc levels: Multilevel disc and facet degeneration throughout the cervical spine. Mild spinal stenosis at C3-4. Mild foraminal narrowing at multiple levels due to spurring. Upper chest: Lung apices clear bilaterally. Other: None IMPRESSION: 1. No acute intracranial abnormality 2. 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 Negative for cervical spine fracture. Multilevel degenerative changes spurring. Electronically Signed   By: Franchot Gallo M.D.   On: 07/12/2018 13:16   Ct Head Wo Contrast  Result Date: 06/25/2018 CLINICAL DATA:  Fall, hit head on toilet. EXAM: CT HEAD WITHOUT CONTRAST CT CERVICAL SPINE WITHOUT CONTRAST TECHNIQUE: Multidetector CT imaging of the head and cervical spine was performed following the standard protocol without intravenous contrast. Multiplanar CT image reconstructions of the cervical spine were also generated. COMPARISON:  05/14/2017 FINDINGS: CT HEAD FINDINGS Brain: Large chronic calcified extra-axial subdural collection again noted measuring 3 cm in thickness. This is unchanged dating back to prior study and dating back to old studies from 2016. This has mass effect on the left cerebral hemisphere and 7 mm left right midline shift, stable. No acute hemorrhage or hydrocephalus. No acute infarction. Vascular: No hyperdense vessel or unexpected calcification. Skull: No acute calvarial abnormality. Sinuses/Orbits: Visualized paranasal sinuses and mastoids clear. Orbital soft tissues unremarkable. Other: Soft tissue swelling over the left occipital region. CT CERVICAL SPINE FINDINGS Alignment: Slight subluxation of C4 on C5 related to facet disease. Skull base and vertebrae: No acute fracture. No primary bone lesion or focal pathologic process. Soft tissues and spinal canal: No prevertebral fluid or swelling. No visible canal hematoma. Disc levels: Severe diffuse degenerative disc and  facet disease throughout the cervical spine. Upper chest: No acute findings Other: Carotid artery calcifications. IMPRESSION: Stable large calcified subdural collection in the left cerebral hemisphere with stable mass effect and midline shift dating back to 2016. No acute intracranial abnormality. Diffuse cervical spondylosis. No acute bony abnormality in the cervical spine. Electronically Signed   By: Rolm Baptise M.D.   On: 06/25/2018 21:08   Ct Cervical Spine Wo Contrast  Result Date: 07/12/2018 CLINICAL DATA:  Fall.  Head injury. EXAM: CT HEAD WITHOUT CONTRAST CT CERVICAL SPINE WITHOUT CONTRAST TECHNIQUE: Multidetector CT imaging of the head and cervical spine was performed following the standard protocol without intravenous contrast. Multiplanar CT image reconstructions of the cervical spine were also generated. 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 This measures approximately 23 mm in thickness. Mass-effect on the left cerebral hemisphere with 6 mm midline shift to the right, unchanged. Generalized atrophy.  Negative for hydrocephalus.  No acute infarct. Vascular: Calcified distal vertebral arteries and cavernous carotid bilaterally. Negative for hyperdense vessel Skull: Negative Sinuses/Orbits: Paranasal sinuses clear bilaterally. Bilateral cataract surgery Other: None CT CERVICAL SPINE FINDINGS Alignment: 3 mm anterolisthesis C4-5. Mild anterolisthesis C7-T1, T1-2, T2-3 Skull base and vertebrae: Negative for fracture Soft tissues  and spinal canal: Extensive atherosclerotic calcification. No soft tissue mass in the neck. Disc levels: Multilevel disc and facet degeneration throughout the cervical spine. Mild spinal stenosis at C3-4. Mild foraminal narrowing at multiple levels due to spurring. Upper chest: Lung apices clear bilaterally. Other: None IMPRESSION: 1. No acute  intracranial abnormality 2. 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 Negative for cervical spine fracture. Multilevel degenerative changes spurring. Electronically Signed   By: Franchot Gallo M.D.   On: 07/12/2018 13:16   Ct Cervical Spine Wo Contrast  Result Date: 06/25/2018 CLINICAL DATA:  Fall, hit head on toilet. EXAM: CT HEAD WITHOUT CONTRAST CT CERVICAL SPINE WITHOUT CONTRAST TECHNIQUE: Multidetector CT imaging of the head and cervical spine was performed following the standard protocol without intravenous contrast. Multiplanar CT image reconstructions of the cervical spine were also generated. COMPARISON:  05/14/2017 FINDINGS: CT HEAD FINDINGS Brain: Large chronic calcified extra-axial subdural collection again noted measuring 3 cm in thickness. This is unchanged dating back to prior study and dating back to old studies from 2016. This has mass effect on the left cerebral hemisphere and 7 mm left right midline shift, stable. No acute hemorrhage or hydrocephalus. No acute infarction. Vascular: No hyperdense vessel or unexpected calcification. Skull: No acute calvarial abnormality. Sinuses/Orbits: Visualized paranasal sinuses and mastoids clear. Orbital soft tissues unremarkable. Other: Soft tissue swelling over the left occipital region. CT CERVICAL SPINE FINDINGS Alignment: Slight subluxation of C4 on C5 related to facet disease. Skull base and vertebrae: No acute fracture. No primary bone lesion or focal pathologic process. Soft tissues and spinal canal: No prevertebral fluid or swelling. No visible canal hematoma. Disc levels: Severe diffuse degenerative disc and facet disease throughout the cervical spine. Upper chest: No acute findings Other: Carotid artery calcifications. IMPRESSION: Stable large calcified subdural collection in the left cerebral hemisphere with stable mass effect and midline shift dating back to  2016. No acute intracranial abnormality. Diffuse cervical spondylosis. No acute bony abnormality in the cervical spine. Electronically Signed   By: Rolm Baptise M.D.   On: 06/25/2018 21:08   Ct Hip Right Wo Contrast  Result Date: 07/13/2018 CLINICAL DATA:  [address].  Suspected hip fracture on plain films. EXAM: CT OF THE RIGHT HIP WITHOUT CONTRAST TECHNIQUE: Multidetector CT imaging of the right hip was performed according to the standard protocol. Multiplanar CT image reconstructions were also generated. COMPARISON:  Radiographs 07/12/2018 FINDINGS: There are relatively nondisplaced fractures of the greater trochanter. I do not see an intertrochanteric fracture or femoral neck fracture. The acetabulum is intact. No iliac or sacral fractures are identified. Moderate degenerative changes are noted at the pubic symphysis. No pubic rami fractures. No significant intrapelvic abnormalities. Advanced atherosclerotic calcifications are noted. Moderate-sized hematoma noted in the subcutaneous tissues superficial to the muscle fascia. There is surrounding soft tissue swelling/edema/hemorrhage. Intermuscular hemorrhage is also suggested in the gluteus muscles. IMPRESSION: 1. Isolated fractures involving the greater trochanter. I do not see an intertrochanteric component or involvement of the femoral neck. 2. Associated intramuscular and subcutaneous hematomas. 3. No pelvic fractures. Electronically Signed   By: Marijo Sanes M.D.   On: 07/13/2018 07:29   Dg Hip Unilat W Or Wo Pelvis 2-3 Views Right  Result Date: 07/12/2018 CLINICAL DATA:  [address] twice today, RIGHT hip pain EXAM: DG HIP (WITH OR WITHOUT PELVIS) 2-3V RIGHT COMPARISON:  None FINDINGS: Osseous demineralization. Hip and SI joint spaces preserved. Probable nondisplaced intertrochanteric  fracture RIGHT femur. No additional fracture or dislocation. Few pelvic phleboliths and scattered atherosclerotic calcifications. Soft tissue swelling laterally overlying the  lateral RIGHT hip region. IMPRESSION: Osseous demineralization with a probable nondisplaced intertrochanteric fracture of the RIGHT femur. Electronically Signed   By: Lavonia Dana M.D.   On: 07/12/2018 13:40   Xr Shoulder Right  Result Date: 06/24/2018 AP lateral right shoulder reviewed.  Hardware unchanged in appearance and position.  No evidence of loosening.  Shoulder is reduced.     Subjective:  No new complaints.   Discharge Exam: Vitals:   07/17/18 0550 07/17/18 0804  BP: (!) 169/86 (!) 167/65  Pulse: 70 66  Resp: 18 17  Temp: 98 F (36.7 C) 98.1 F (36.7 C)  SpO2: 97% 98%   Vitals:   07/16/18 1400 07/16/18 2013 07/17/18 0550 07/17/18 0804  BP: (!) 159/59 (!) 173/110 (!) 169/86 (!) 167/65  Pulse: 63 75 70 66  Resp: 16 20 18 17   Temp: 98.7 F (37.1 C) 97.6 F (36.4 C) 98 F (36.7 C) 98.1 F (36.7 C)  TempSrc: Oral Oral Oral Oral  SpO2:  100% 97% 98%    General: Well-developed well-nourished no acute distress.  Alert and interactive.   Cardiovascular: Regular rate and rhythm with no rubs or gallops.  No JVD or thyromegaly noted.  Respiratory: Clear to auscultation with no wheezes or rales.  Poor inspiratory effort. Abdominal: Soft, NT, ND, bowel sounds + Extremities: no edema, no cyanosis    The results of significant diagnostics from this hospitalization (including imaging, microbiology, ancillary and laboratory) are listed below for reference.     Microbiology: Recent Results (from the past 240 hour(s))  Urine culture     Status: Abnormal   Collection Time: 07/12/18  3:42 PM  Result Value Ref Range Status   Specimen Description URINE, RANDOM  Final   Special Requests   Final    NONE Performed at [facility], [address], [place], [place] [address]    Culture 30,000 COLONIES/mL ENTEROCOCCUS FAECALIS (A)  Final   Report Status 07/14/2018 FINAL  Final   Organism ID, Bacteria ENTEROCOCCUS FAECALIS (A)  Final      Susceptibility   Enterococcus  faecalis - MIC*    AMPICILLIN <=2 SENSITIVE Sensitive     LEVOFLOXACIN 0.5 SENSITIVE Sensitive     NITROFURANTOIN <=16 SENSITIVE Sensitive     VANCOMYCIN 1 SENSITIVE Sensitive     * 30,000 COLONIES/mL ENTEROCOCCUS FAECALIS     Labs: BNP (last 3 results) No results for input(s): BNP in the last 8760 hours. Basic Metabolic Panel: Recent Labs  Lab 07/12/18 1347 07/13/18 0237 07/14/18 1124  NA 134* 137 138  K 3.1* 3.6 3.9  CL 97* 103 106  CO2 25 25 24   GLUCOSE 151* 132* 102*  BUN 22 20 18   CREATININE 1.87* 1.73* 1.17*  CALCIUM 8.8* 8.3* 8.0*   Liver Function Tests: Recent Labs  Lab 07/12/18 1347  AST 22  ALT 13  ALKPHOS 62  BILITOT 1.1  PROT 5.0*  ALBUMIN 3.2*   No results for input(s): LIPASE, AMYLASE in the last 168 hours. No results for input(s): AMMONIA in the last 168 hours. CBC: Recent Labs  Lab 07/10/18 1408 07/12/18 1347 07/13/18 0237 07/14/18 1124  WBC 6.4 9.1 6.6 7.1  NEUTROABS 4.2 6.7  --   --   HGB 12.2 9.7* 7.6* 10.3*  HCT 35.7* 29.1* 22.1* 29.6*  MCV 100.3* 101.4* 102.3* 101.7*  PLT 75* 108* 75*  95*   Cardiac Enzymes: No results for input(s): CKTOTAL, CKMB, CKMBINDEX, TROPONINI in the last 168 hours. BNP: Invalid input(s): POCBNP CBG: No results for input(s): GLUCAP in the last 168 hours. D-Dimer No results for input(s): DDIMER in the last 72 hours. Hgb A1c No results for input(s): HGBA1C in the last 72 hours. Lipid Profile No results for input(s): CHOL, HDL, LDLCALC, TRIG, CHOLHDL, LDLDIRECT in the last 72 hours. Thyroid function studies No results for input(s): TSH, T4TOTAL, T3FREE, THYROIDAB in the last 72 hours.  Invalid input(s): FREET3 Anemia work up No results for input(s): VITAMINB12, FOLATE, FERRITIN, TIBC, IRON, RETICCTPCT in the last 72 hours. Urinalysis    Component Value Date/Time   COLORURINE YELLOW 07/14/2018 0410   APPEARANCEUR CLEAR 07/14/2018 0410   APPEARANCEUR Hazy 11/19/2012 1750   LABSPEC 1.018 07/14/2018  0410   LABSPEC 1.014 11/19/2012 1750   PHURINE 5.0 07/14/2018 0410   GLUCOSEU NEGATIVE 07/14/2018 0410   GLUCOSEU Negative 11/19/2012 1750   HGBUR NEGATIVE 07/14/2018 0410   BILIRUBINUR NEGATIVE 07/14/2018 0410   BILIRUBINUR negative 05/12/2015 1823   BILIRUBINUR Negative 11/19/2012 1750   KETONESUR 5 (A) 07/14/2018 0410   PROTEINUR NEGATIVE 07/14/2018 0410   UROBILINOGEN 0.2 05/13/2015 0504   NITRITE NEGATIVE 07/14/2018 0410   LEUKOCYTESUR SMALL (A) 07/14/2018 0410   LEUKOCYTESUR 2+ 11/19/2012 1750   Sepsis Labs Invalid input(s): PROCALCITONIN,  WBC,  LACTICIDVEN Microbiology Recent Results (from the past 240 hour(s))  Urine culture     Status: Abnormal   Collection Time: 07/12/18  3:42 PM  Result Value Ref Range Status   Specimen Description URINE, RANDOM  Final   Special Requests   Final    NONE Performed at [facility], [place] [address] [place], [place] [address]    Culture 30,000 COLONIES/mL ENTEROCOCCUS FAECALIS (A)  Final   Report Status 07/14/2018 FINAL  Final   Organism ID, Bacteria ENTEROCOCCUS FAECALIS (A)  Final      Susceptibility   Enterococcus faecalis - MIC*    AMPICILLIN <=2 SENSITIVE Sensitive     LEVOFLOXACIN 0.5 SENSITIVE Sensitive     NITROFURANTOIN <=16 SENSITIVE Sensitive     VANCOMYCIN 1 SENSITIVE Sensitive     * 30,000 COLONIES/mL ENTEROCOCCUS FAECALIS     Time coordinating discharge: 34 minutes  SIGNED:   Kayleen Memos, MD  [facility] 07/17/2018, 10:26 AM Pager   If 7PM-7AM, please contact night-coverage [email or web address] Password [email or web address]

## 2018-07-17 NOTE — Care Management Obs Status (Signed)
[facility] NOTIFICATION   Patient Details  Name: Lisa James MRN: [id number] Date of Birth: March 15, 1928   Medicare Observation Status Notification Given:  Other (see comment)(pt confused, verbal given to [facility] via telephone)    Fuller Mandril, RN 07/17/2018, 11:56 AM

## 2018-07-17 NOTE — Progress Notes (Signed)
Gave report to RN at [facility]. Awaitiing for [facility] to pick up pt.

## 2018-07-17 NOTE — Progress Notes (Signed)
Physical Therapy Treatment Patient Details Name: Lisa James MRN: [id number] DOB: 12-19-1927 Today's Date: 07/17/2018    History of Present Illness 84 y.o. female with medical history significant of dementia, type 2 diabetes mellitus, hypertension, chronic systolic heart failure, history of coronary artery disease, non-Hodgkin's lymphoma, history of chronic subdural hematoma, chronic thrombocytopenia, was brought to the hospital after a fall from wheelchair.  Xrays revealed Closed fracture of neck of right femur which can be treated non-operatively with WBAT. Patient is confused and unable to provide history. Per notes pt is resident at [facility] facility    PT Comments    Patient continues to make progress with mobility and appears to have very little pain with mobility. Pt is eager to mobilize. Overall pt requires min guard/min A for functional transfers and gait. Continue to progress as tolerated with anticipated d/c to SNF for further skilled PT services.     Follow Up Recommendations  SNF;Supervision/Assistance - 24 hour     Equipment Recommendations  None recommended by PT    Recommendations for Other Services       Precautions / Restrictions Precautions Precautions: Fall Precaution Comments: fx result of fall Restrictions Weight Bearing Restrictions: Yes RLE Weight Bearing: Weight bearing as tolerated    Mobility  Bed Mobility               General bed mobility comments: pt OOB in chair upon arrival  Transfers Overall transfer level: Needs assistance Equipment used: Rolling walker (2 wheeled) Transfers: Sit to/from Stand Sit to Stand: Min assist;Min guard         General transfer comment: assist to steady  Ambulation/Gait Ambulation/Gait assistance: Min assist Gait Distance (Feet): 200 Feet Assistive device: Rolling walker (2 wheeled) Gait Pattern/deviations: Step-through pattern;Narrow base of support;Decreased stance time -  right;Decreased step length - left;Decreased step length - right;Decreased weight shift to right Gait velocity: decreased   General Gait Details: cues for safe use of AD especially with turning/directional changes; assist to steady and guide RW; pt runs into objects often on L side   Stairs             Wheelchair Mobility    Modified Rankin (Stroke Patients Only)       Balance Overall balance assessment: Needs assistance Sitting-balance support: Feet supported;No upper extremity supported Sitting balance-Leahy Scale: Fair     Standing balance support: Bilateral upper extremity supported;During functional activity Standing balance-Leahy Scale: Poor                              Cognition Arousal/Alertness: Awake/alert Behavior During Therapy: WFL for tasks assessed/performed Overall Cognitive Status: History of cognitive impairments - at baseline                                 General Comments: advanced dementia. pt reoriented to place and situation throughout session      Exercises      General Comments        Pertinent Vitals/Pain Pain Assessment: Faces Faces Pain Scale: Hurts a little bit Pain Location: bilat LE Pain Descriptors / Indicators: Sore Pain Intervention(s): Limited activity within patient's tolerance;Monitored during session;Repositioned    Home Living                      Prior Function  PT Goals (current goals can now be found in the care plan section) Acute Rehab PT Goals Patient Stated Goal: "i've gotta go" Progress towards PT goals: Progressing toward goals    Frequency    Min 3X/week      PT Plan Current plan remains appropriate    Co-evaluation              AM-PAC PT "6 Clicks" Mobility   Outcome Measure  Help needed turning from your back to your side while in a flat bed without using bedrails?: A Little Help needed moving from lying on your back to sitting on the  side of a flat bed without using bedrails?: A Little Help needed moving to and from a bed to a chair (including a wheelchair)?: A Little Help needed standing up from a chair using your arms (e.g., wheelchair or bedside chair)?: A Little Help needed to walk in hospital room?: A Little Help needed climbing 3-5 steps with a railing? : A Lot 6 Click Score: 17    End of Session Equipment Utilized During Treatment: Gait belt Activity Tolerance: Patient tolerated treatment well Patient left: with call bell/phone within reach;in chair Nurse Communication: Mobility status PT Visit Diagnosis: Unsteadiness on feet (R26.81);Other abnormalities of gait and mobility (R26.89);Repeated falls (R29.6);Muscle weakness (generalized) (M62.81);History of falling (Z91.81);Difficulty in walking, not elsewhere classified (R26.2)     Time: 3532-9924 PT Time Calculation (min) (ACUTE ONLY): 20 min  Charges:  $Gait Training: 8-22 mins                     Earney Navy, PTA Acute Rehabilitation Services Pager: [phone] Office: [phone]     Darliss Cheney 07/17/2018, 9:46 AM

## 2018-07-17 NOTE — Care Management CC44 (Signed)
Condition Code 44 Documentation Completed  Patient Details  Name: Lisa James MRN: [id number] Date of Birth: 1928-04-29   Condition Code 44 given:  Yes Patient signature on Condition Code 44 notice:  Yes(verbal via telephone) Documentation of 2 MD's agreement:  Yes Code 44 added to claim:  Yes    Fuller Mandril, RN 07/17/2018, 11:56 AM

## 2018-07-17 NOTE — Clinical Social Work Placement (Signed)
Nurse to call and report to [phone].   CLINICAL SOCIAL WORK PLACEMENT  NOTE  Date:  07/17/2018  Patient Details  Name: Lisa James MRN: [id number] Date of Birth: 10-20-1927  Clinical Social Work is seeking post-discharge placement for this patient at the [place] level of care (*CSW will initial, date and re-position this form in  chart as items are completed):  Yes   Patient/family provided with [facility] Work Department's list of facilities offering this level of care within the geographic area requested by the patient (or if unable, by the patient's family).  Yes   Patient/family informed of their freedom to choose among providers that offer the needed level of care, that participate in Medicare, Medicaid or managed care program needed by the patient, have an available bed and are willing to accept the patient.  Yes   Patient/family informed of [facility]'s ownership interest in [facility] and [facility], as well as of the fact that they are under no obligation to receive care at these facilities.  PASRR submitted to EDS on       PASRR number received on       Existing PASRR number confirmed on 07/14/18     FL2 transmitted to all facilities in geographic area requested by pt/family on 07/14/18     FL2 transmitted to all facilities within larger geographic area on       Patient informed that his/her managed care company has contracts with or will negotiate with certain facilities, including the following:        Yes   Patient/family informed of bed offers received.  Patient chooses bed at [place], [place]     Physician recommends and patient chooses bed at      Patient to be transferred to [place], [place] on  .  Patient to be transferred to facility by PTAR     Patient family notified on 07/17/18 of transfer.  Name of family member notified:  Janeece Agee     PHYSICIAN       Additional Comment:     _______________________________________________ Gelene Mink, [place] 07/17/2018, 9:54 AM

## 2018-07-17 NOTE — Progress Notes (Addendum)
Patient will DC to: [facility] Anticipated DC date: 07/17/18 Family notified: Yes Transport by: Corey Harold   Per MD patient ready for DC to  [facility]. RN, patient, patient's family, and facility notified of DC. Discharge Summary and FL2 sent to facility. RN to call report prior to discharge [phone]) Room 104A. DC packet on chart. Ambulance transport requested for patient.   CSW will sign off for now as social work intervention is no longer needed. Please consult [place] again if new needs arise.  Caitlin Pinion, LCSW-A [place]/Clinical Social Work Department Cell: [phone]

## 2018-07-17 NOTE — Discharge Planning (Signed)
EDCM spoke with pt niece, Laqueta Jean to via telephone to explain code 44 status.  Niece concerned that pt would not go to SNF with change in status.  EDCM reassured niece that pt was on her way to SNF.  No further needs identified.

## 2018-07-17 NOTE — Care Management Important Message (Signed)
Important Message  Patient Details  Name: Lisa James MRN: [id number] Date of Birth: [birth year]   Medicare Important Message Given:  Yes    Fuller Mandril, RN 07/17/2018, 11:49 AM

## 2018-07-17 NOTE — Progress Notes (Signed)
Lisa James to be D/C'd to [facility] per MD order.  Discussed prescriptions and follow up appointments with the patient. Prescriptions given to patient, medication list explained in detail. Pt verbalized understanding.  Allergies as of 07/17/2018      Reactions   Ace Inhibitors Cough      Medication List    TAKE these medications   atorvastatin 20 MG tablet Commonly known as:  LIPITOR TAKE 1 TABLET (20 MG TOTAL) BY MOUTH DAILY. What changed:  See the new instructions.   calcium-vitamin D 500-200 MG-UNIT tablet Take 1 tablet by mouth daily.   carvedilol 6.25 MG tablet Commonly known as:  COREG Take 1 tablet (6.25 mg total) by mouth 2 (two) times daily with a meal.   docusate sodium 100 MG capsule Commonly known as:  COLACE Take 1 capsule (100 mg total) by mouth 2 (two) times daily.   eltrombopag 50 MG tablet Commonly known as:  PROMACTA Take 100 mg by mouth daily. Take on an empty stomach 1 hour before a meal or 2 hours after   eltrombopag 75 MG tablet Commonly known as:  PROMACTA Take 1 tablet (75 mg total) by mouth daily. Take on an empty stomach 1 hour before meals or 2 hours after.   feeding supplement (ENSURE ENLIVE) Liqd Take 237 mLs by mouth 3 (three) times daily between meals.   HYDROcodone-acetaminophen 5-325 MG tablet Commonly known as:  NORCO/VICODIN Take 1 tablet by mouth every 6 (six) hours as needed (for pain).   LACTOBACILLUS PO Take 2 capsules by mouth 3 (three) times daily.   methocarbamol 500 MG tablet Commonly known as:  ROBAXIN Take 1 tablet (500 mg total) by mouth every 6 (six) hours as needed for muscle spasms.   OVER THE COUNTER MEDICATION Aquacel Dressing: "Maintain to right shoulder until ortho follow-up"   PRESERVISION AREDS PO Take 1 tablet by mouth daily.   sertraline 25 MG tablet Commonly known as:  ZOLOFT Take 25 mg by mouth daily.   spironolactone 25 MG tablet Commonly known as:  ALDACTONE Take 25 mg by mouth daily.    traZODone 50 MG tablet Commonly known as:  DESYREL Take 50-100 mg by mouth at bedtime as needed for sleep (for 14 days- until 06/15/18).   Vitamin D 1000 units capsule Take 1,000 Units by mouth at bedtime.   ZEASORB powder Generic drug:  talc Apply 1 application topically every 12 (twelve) hours as needed (UNDER BREASTS, for irritation).       Vitals:   07/17/18 0550 07/17/18 0804  BP: (!) 169/86 (!) 167/65  Pulse: 70 66  Resp: 18 17  Temp: 98 F (36.7 C) 98.1 F (36.7 C)  SpO2: 97% 98%    Skin clean, dry and intact without evidence of skin break down, has skin tear to right arm posterior skin foam applied and bruise above rt eye all noted in report, see epic for more documentation. IV catheter discontinued intact. Site without signs and symptoms of complications. Dressing and pressure applied. Pt denies pain at this time. No complaints noted.Promacta given to PTAR in hand by CN, facility aware pt will be bringing with PTAR. Niece aware pt leaving per PTAR.   An After Visit Summary was printed and given to Lisa James Patient escorted via PTAR.  Lisa Rued RN [place]

## 2018-07-21 ENCOUNTER — Telehealth: Payer: Self-pay

## 2018-07-21 NOTE — Telephone Encounter (Signed)
Faxed order to [facility] at fax [phone] for CBC to be drawn in 2, 4 and 8 weeks with results to be faxed to [place] at [phone]

## 2018-07-22 DIAGNOSIS — G4700 Insomnia, unspecified: Secondary | ICD-10-CM | POA: Diagnosis not present

## 2018-07-22 DIAGNOSIS — S72001A Fracture of unspecified part of neck of right femur, initial encounter for closed fracture: Secondary | ICD-10-CM | POA: Diagnosis not present

## 2018-07-22 DIAGNOSIS — E119 Type 2 diabetes mellitus without complications: Secondary | ICD-10-CM | POA: Diagnosis not present

## 2018-07-22 DIAGNOSIS — D693 Immune thrombocytopenic purpura: Secondary | ICD-10-CM | POA: Diagnosis not present

## 2018-07-22 DIAGNOSIS — K219 Gastro-esophageal reflux disease without esophagitis: Secondary | ICD-10-CM | POA: Diagnosis not present

## 2018-07-22 DIAGNOSIS — S72001D Fracture of unspecified part of neck of right femur, subsequent encounter for closed fracture with routine healing: Secondary | ICD-10-CM | POA: Diagnosis not present

## 2018-07-22 DIAGNOSIS — N390 Urinary tract infection, site not specified: Secondary | ICD-10-CM | POA: Diagnosis not present

## 2018-07-22 DIAGNOSIS — I2510 Atherosclerotic heart disease of native coronary artery without angina pectoris: Secondary | ICD-10-CM | POA: Diagnosis not present

## 2018-07-22 DIAGNOSIS — E43 Unspecified severe protein-calorie malnutrition: Secondary | ICD-10-CM | POA: Diagnosis not present

## 2018-07-22 DIAGNOSIS — I10 Essential (primary) hypertension: Secondary | ICD-10-CM | POA: Diagnosis not present

## 2018-07-22 DIAGNOSIS — N183 Chronic kidney disease, stage 3 (moderate): Secondary | ICD-10-CM | POA: Diagnosis not present

## 2018-07-22 DIAGNOSIS — C8590 Non-Hodgkin lymphoma, unspecified, unspecified site: Secondary | ICD-10-CM | POA: Diagnosis not present

## 2018-07-22 DIAGNOSIS — M62838 Other muscle spasm: Secondary | ICD-10-CM | POA: Diagnosis not present

## 2018-07-22 DIAGNOSIS — I5022 Chronic systolic (congestive) heart failure: Secondary | ICD-10-CM | POA: Diagnosis not present

## 2018-07-22 DIAGNOSIS — R52 Pain, unspecified: Secondary | ICD-10-CM | POA: Diagnosis not present

## 2018-07-22 DIAGNOSIS — S065X9D Traumatic subdural hemorrhage with loss of consciousness of unspecified duration, subsequent encounter: Secondary | ICD-10-CM | POA: Diagnosis not present

## 2018-07-22 DIAGNOSIS — K5900 Constipation, unspecified: Secondary | ICD-10-CM | POA: Diagnosis not present

## 2018-07-29 ENCOUNTER — Ambulatory Visit (INDEPENDENT_AMBULATORY_CARE_PROVIDER_SITE_OTHER): Payer: Medicare Other

## 2018-07-29 ENCOUNTER — Ambulatory Visit (INDEPENDENT_AMBULATORY_CARE_PROVIDER_SITE_OTHER): Payer: Medicare Other | Admitting: Orthopedic Surgery

## 2018-07-29 ENCOUNTER — Encounter (INDEPENDENT_AMBULATORY_CARE_PROVIDER_SITE_OTHER): Payer: Self-pay | Admitting: Orthopedic Surgery

## 2018-07-29 DIAGNOSIS — S72001A Fracture of unspecified part of neck of right femur, initial encounter for closed fracture: Secondary | ICD-10-CM

## 2018-07-29 NOTE — Progress Notes (Signed)
Post-Op Visit Note   Patient: Lisa James           Date of Birth: 12/23/1927           MRN: [id number] Visit Date: 07/29/2018 PCP: Merrilee Seashore, MD   Assessment & Plan:  Chief Complaint:  Chief Complaint  Patient presents with  . Right Shoulder - Follow-up   Visit Diagnoses:  1. Closed fracture of neck of right femur, initial encounter ([place])     Plan: [place] is a patient with right proximal humerus fracture fixation.  Been doing well.  On exam patient has good range of motion active and passive.  Both above 90 degrees.  Radiographs look good.  Having a release [place] at this time.  Follow-up with me as needed.  Okay to weight-bear with a walker on that right proximal humerus.  Follow-Up Instructions: Return if symptoms worsen or fail to improve.   Orders:  Orders Placed This Encounter  Procedures  . XR Shoulder Right   No orders of the defined types were placed in this encounter.   Imaging: Xr Shoulder Right  Result Date: 07/29/2018 2 views right proximal humerus reviewed.  Good position alignment of plate fixation for proximal humerus fracture.  No evidence of collapse or hardware complication.  Shoulder is located.   PMFS History: Patient Active Problem List   Diagnosis Date Noted  . Protein-calorie malnutrition, severe 07/13/2018  . Closed right hip fracture, initial encounter ([place]) 07/12/2018  . Acute on chronic anemia 06/05/2018  . Chronic diastolic CHF (congestive heart failure) ([place]) 06/05/2018  . Closed fracture of right proximal humerus 05/30/2018  . Proximal humerus fracture 05/28/2018  . Pressure injury of skin 05/15/2018  . Fall 05/14/2018  . Closed comminuted right humeral fracture 05/14/2018  . UTI (urinary tract infection) 05/14/2018  . Hyponatremia 05/14/2018  . Leucocytosis 05/14/2018  . Altered mental status 05/13/2015  . Idiopathic thrombocytopenic purpura ([place]) 07/06/2013  . Aortic valve stenosis 06/19/2013  . Immune  thrombocytopenic purpura ([place]) 01/21/2013  . GERD (gastroesophageal reflux disease) 12/17/2012  . Type II or unspecified type diabetes mellitus with renal manifestations, not stated as uncontrolled(250.40) 12/10/2012  . Chronic kidney disease (CKD), stage III (moderate) ([place]) 11/22/2012  . Diabetes mellitus ([place]) 07/03/2011  . Depression 07/03/2011  . Non-Hodgkin's lymphoma ([place]) 07/03/2011  . CAD (coronary artery disease) 07/03/2011  . PAD (peripheral artery disease) ([place]) 07/03/2011  . Vitamin D deficiency 07/03/2011  . Subdural hematoma ([place]) 07/03/2011  . Confusion 05/12/2011  . Subdural hematoma, acute ([place]) 05/12/2011  . HYPERLIPIDEMIA TYPE IIB / III 05/10/2009  . OLD MYOCARDIAL INFARCTION 05/10/2009  . Occlusion and stenosis of carotid artery without mention of cerebral infarction 05/10/2009  . HYPERTENSION, BENIGN 11/03/2008  . MITRAL REGURGITATION 10/29/2008  . ISCHEMIC HEART DISEASE 10/29/2008  . SYSTOLIC HEART FAILURE, CHRONIC 10/29/2008   Past Medical History:  Diagnosis Date  . Acute MI, lateral wall ([place])   . Aortic stenosis   . Chronic kidney disease   . Chronic systolic heart failure ([place])   . Coronary artery disease    sees Dr Claiborne Billings every 6 months  . Dementia ([place])   . Diabetes ([place])   . DM (diabetes mellitus) ([place])    type 2 ; diet controlled  . Heart disease   . HTN (hypertension)   . Immune thrombocytopenic purpura ([place]) 01/21/2013  . Ischemic heart disease   . Kidney disease   . Mitral regurgitation   . Non Hodgkin's lymphoma ([place])  of the throat  . Subdural hematoma, post-traumatic ([place]) 2015   sees Dr Sherwood Gambler every 6 months  . Thrombocytopenia ([place])    sees Dr Marin Olp every 2 weeks ;receives Nplate prn    Family History  Problem Relation Age of Onset  . Heart attack Father   . Diabetes Father     Past Surgical History:  Procedure Laterality Date  . BONE MARROW BIOPSY  11/22/2002   left posterior iliac crest bone marrow biopsy and aspirate    . CATARACT EXTRACTION W/ INTRAOCULAR LENS  IMPLANT, BILATERAL Bilateral   . CORONARY ARTERY BYPASS GRAFT  11/20/2007   x5  . EYE SURGERY    . ORIF HUMERUS FRACTURE Right 05/28/2018   Procedure: RIGHT OPEN REDUCTION INTERNAL FIXATION (ORIF) PROXIMAL HUMERUS FRACTURE;  Surgeon: Meredith Pel, MD;  Location: [place] [facility];  Service: Orthopedics;  Laterality: Right;  . submental lymph node excisional biopsy  10/22/2002   Social History   Occupational History    Comment: retired  Tobacco Use  . Smoking status: Never Smoker  . Smokeless tobacco: Never Used  . Tobacco comment: never used tobacco.  Substance and Sexual Activity  . Alcohol use: No    Alcohol/week: 0.0 standard drinks  . Drug use: No  . Sexual activity: Never    Birth control/protection: Abstinence

## 2018-08-03 ENCOUNTER — Telehealth: Payer: Self-pay

## 2018-08-03 NOTE — Telephone Encounter (Signed)
Faxed order for medication change to Promacta 75 mg po one daily to [facility], [phone]

## 2018-08-05 DIAGNOSIS — E119 Type 2 diabetes mellitus without complications: Secondary | ICD-10-CM | POA: Diagnosis not present

## 2018-08-05 DIAGNOSIS — L03115 Cellulitis of right lower limb: Secondary | ICD-10-CM | POA: Diagnosis not present

## 2018-08-05 DIAGNOSIS — S51801A Unspecified open wound of right forearm, initial encounter: Secondary | ICD-10-CM | POA: Diagnosis not present

## 2018-08-05 DIAGNOSIS — H1032 Unspecified acute conjunctivitis, left eye: Secondary | ICD-10-CM | POA: Diagnosis not present

## 2018-08-05 DIAGNOSIS — G309 Alzheimer's disease, unspecified: Secondary | ICD-10-CM | POA: Diagnosis not present

## 2018-08-06 ENCOUNTER — Other Ambulatory Visit: Payer: Medicare Other

## 2018-08-06 ENCOUNTER — Ambulatory Visit: Payer: Medicare Other

## 2018-08-07 DIAGNOSIS — R5383 Other fatigue: Secondary | ICD-10-CM | POA: Diagnosis not present

## 2018-08-07 DIAGNOSIS — I5022 Chronic systolic (congestive) heart failure: Secondary | ICD-10-CM | POA: Diagnosis not present

## 2018-08-07 DIAGNOSIS — R600 Localized edema: Secondary | ICD-10-CM | POA: Diagnosis not present

## 2018-08-07 DIAGNOSIS — E782 Mixed hyperlipidemia: Secondary | ICD-10-CM | POA: Diagnosis not present

## 2018-08-07 DIAGNOSIS — I10 Essential (primary) hypertension: Secondary | ICD-10-CM | POA: Diagnosis not present

## 2018-08-08 DIAGNOSIS — I10 Essential (primary) hypertension: Secondary | ICD-10-CM | POA: Diagnosis not present

## 2018-08-08 DIAGNOSIS — E119 Type 2 diabetes mellitus without complications: Secondary | ICD-10-CM | POA: Diagnosis not present

## 2018-08-08 DIAGNOSIS — S90821D Blister (nonthermal), right foot, subsequent encounter: Secondary | ICD-10-CM | POA: Diagnosis not present

## 2018-08-08 DIAGNOSIS — I2510 Atherosclerotic heart disease of native coronary artery without angina pectoris: Secondary | ICD-10-CM | POA: Diagnosis not present

## 2018-08-08 DIAGNOSIS — C8580 Other specified types of non-Hodgkin lymphoma, unspecified site: Secondary | ICD-10-CM | POA: Diagnosis not present

## 2018-08-08 DIAGNOSIS — I5032 Chronic diastolic (congestive) heart failure: Secondary | ICD-10-CM | POA: Diagnosis not present

## 2018-08-08 DIAGNOSIS — N183 Chronic kidney disease, stage 3 (moderate): Secondary | ICD-10-CM | POA: Diagnosis not present

## 2018-08-10 ENCOUNTER — Other Ambulatory Visit: Payer: Self-pay | Admitting: Hematology

## 2018-08-10 DIAGNOSIS — I5032 Chronic diastolic (congestive) heart failure: Secondary | ICD-10-CM | POA: Diagnosis not present

## 2018-08-10 DIAGNOSIS — E119 Type 2 diabetes mellitus without complications: Secondary | ICD-10-CM | POA: Diagnosis not present

## 2018-08-10 DIAGNOSIS — I10 Essential (primary) hypertension: Secondary | ICD-10-CM | POA: Diagnosis not present

## 2018-08-10 DIAGNOSIS — S90821D Blister (nonthermal), right foot, subsequent encounter: Secondary | ICD-10-CM | POA: Diagnosis not present

## 2018-08-10 DIAGNOSIS — N183 Chronic kidney disease, stage 3 (moderate): Secondary | ICD-10-CM | POA: Diagnosis not present

## 2018-08-10 DIAGNOSIS — I2510 Atherosclerotic heart disease of native coronary artery without angina pectoris: Secondary | ICD-10-CM | POA: Diagnosis not present

## 2018-08-10 DIAGNOSIS — C8580 Other specified types of non-Hodgkin lymphoma, unspecified site: Secondary | ICD-10-CM | POA: Diagnosis not present

## 2018-08-11 DIAGNOSIS — E119 Type 2 diabetes mellitus without complications: Secondary | ICD-10-CM | POA: Diagnosis not present

## 2018-08-11 DIAGNOSIS — C8580 Other specified types of non-Hodgkin lymphoma, unspecified site: Secondary | ICD-10-CM | POA: Diagnosis not present

## 2018-08-11 DIAGNOSIS — I2510 Atherosclerotic heart disease of native coronary artery without angina pectoris: Secondary | ICD-10-CM | POA: Diagnosis not present

## 2018-08-11 DIAGNOSIS — N183 Chronic kidney disease, stage 3 (moderate): Secondary | ICD-10-CM | POA: Diagnosis not present

## 2018-08-11 DIAGNOSIS — I5032 Chronic diastolic (congestive) heart failure: Secondary | ICD-10-CM | POA: Diagnosis not present

## 2018-08-11 DIAGNOSIS — S90821D Blister (nonthermal), right foot, subsequent encounter: Secondary | ICD-10-CM | POA: Diagnosis not present

## 2018-08-11 DIAGNOSIS — I10 Essential (primary) hypertension: Secondary | ICD-10-CM | POA: Diagnosis not present

## 2018-08-12 DIAGNOSIS — C8599 Non-Hodgkin lymphoma, unspecified, extranodal and solid organ sites: Secondary | ICD-10-CM | POA: Diagnosis not present

## 2018-08-12 DIAGNOSIS — I10 Essential (primary) hypertension: Secondary | ICD-10-CM | POA: Diagnosis not present

## 2018-08-12 DIAGNOSIS — N390 Urinary tract infection, site not specified: Secondary | ICD-10-CM | POA: Diagnosis not present

## 2018-08-12 DIAGNOSIS — C8580 Other specified types of non-Hodgkin lymphoma, unspecified site: Secondary | ICD-10-CM | POA: Diagnosis not present

## 2018-08-12 DIAGNOSIS — S90821D Blister (nonthermal), right foot, subsequent encounter: Secondary | ICD-10-CM | POA: Diagnosis not present

## 2018-08-12 DIAGNOSIS — S72041A Displaced fracture of base of neck of right femur, initial encounter for closed fracture: Secondary | ICD-10-CM | POA: Diagnosis not present

## 2018-08-12 DIAGNOSIS — I2510 Atherosclerotic heart disease of native coronary artery without angina pectoris: Secondary | ICD-10-CM | POA: Diagnosis not present

## 2018-08-12 DIAGNOSIS — N183 Chronic kidney disease, stage 3 (moderate): Secondary | ICD-10-CM | POA: Diagnosis not present

## 2018-08-12 DIAGNOSIS — S065X9A Traumatic subdural hemorrhage with loss of consciousness of unspecified duration, initial encounter: Secondary | ICD-10-CM | POA: Diagnosis not present

## 2018-08-12 DIAGNOSIS — E119 Type 2 diabetes mellitus without complications: Secondary | ICD-10-CM | POA: Diagnosis not present

## 2018-08-12 DIAGNOSIS — I5032 Chronic diastolic (congestive) heart failure: Secondary | ICD-10-CM | POA: Diagnosis not present

## 2018-08-14 ENCOUNTER — Emergency Department (HOSPITAL_COMMUNITY): Payer: Medicare Other

## 2018-08-14 ENCOUNTER — Inpatient Hospital Stay (HOSPITAL_COMMUNITY)
Admission: EM | Admit: 2018-08-14 | Discharge: 2018-08-17 | DRG: 603 | Disposition: A | Discharge status: Nursing Home (Includes ICF) | Payer: Medicare Other | Attending: Family Medicine | Admitting: Family Medicine

## 2018-08-14 DIAGNOSIS — D631 Anemia in chronic kidney disease: Secondary | ICD-10-CM | POA: Diagnosis not present

## 2018-08-14 DIAGNOSIS — I5032 Chronic diastolic (congestive) heart failure: Secondary | ICD-10-CM | POA: Diagnosis present

## 2018-08-14 DIAGNOSIS — R21 Rash and other nonspecific skin eruption: Secondary | ICD-10-CM | POA: Diagnosis not present

## 2018-08-14 DIAGNOSIS — Z9841 Cataract extraction status, right eye: Secondary | ICD-10-CM | POA: Diagnosis not present

## 2018-08-14 DIAGNOSIS — Z961 Presence of intraocular lens: Secondary | ICD-10-CM | POA: Diagnosis present

## 2018-08-14 DIAGNOSIS — E11621 Type 2 diabetes mellitus with foot ulcer: Secondary | ICD-10-CM | POA: Diagnosis not present

## 2018-08-14 DIAGNOSIS — N179 Acute kidney failure, unspecified: Secondary | ICD-10-CM | POA: Diagnosis present

## 2018-08-14 DIAGNOSIS — L89212 Pressure ulcer of right hip, stage 2: Secondary | ICD-10-CM | POA: Diagnosis present

## 2018-08-14 DIAGNOSIS — F0390 Unspecified dementia without behavioral disturbance: Secondary | ICD-10-CM | POA: Diagnosis present

## 2018-08-14 DIAGNOSIS — Z9842 Cataract extraction status, left eye: Secondary | ICD-10-CM | POA: Diagnosis not present

## 2018-08-14 DIAGNOSIS — N183 Chronic kidney disease, stage 3 unspecified: Secondary | ICD-10-CM | POA: Diagnosis present

## 2018-08-14 DIAGNOSIS — Z8572 Personal history of non-Hodgkin lymphomas: Secondary | ICD-10-CM

## 2018-08-14 DIAGNOSIS — Z951 Presence of aortocoronary bypass graft: Secondary | ICD-10-CM

## 2018-08-14 DIAGNOSIS — I2510 Atherosclerotic heart disease of native coronary artery without angina pectoris: Secondary | ICD-10-CM | POA: Diagnosis present

## 2018-08-14 DIAGNOSIS — R600 Localized edema: Secondary | ICD-10-CM

## 2018-08-14 DIAGNOSIS — L89326 Pressure-induced deep tissue damage of left buttock: Secondary | ICD-10-CM | POA: Diagnosis present

## 2018-08-14 DIAGNOSIS — S90821D Blister (nonthermal), right foot, subsequent encounter: Secondary | ICD-10-CM | POA: Diagnosis not present

## 2018-08-14 DIAGNOSIS — Z79899 Other long term (current) drug therapy: Secondary | ICD-10-CM

## 2018-08-14 DIAGNOSIS — L97519 Non-pressure chronic ulcer of other part of right foot with unspecified severity: Secondary | ICD-10-CM | POA: Diagnosis present

## 2018-08-14 DIAGNOSIS — Z833 Family history of diabetes mellitus: Secondary | ICD-10-CM

## 2018-08-14 DIAGNOSIS — R609 Edema, unspecified: Secondary | ICD-10-CM | POA: Diagnosis not present

## 2018-08-14 DIAGNOSIS — D693 Immune thrombocytopenic purpura: Secondary | ICD-10-CM | POA: Diagnosis not present

## 2018-08-14 DIAGNOSIS — C8580 Other specified types of non-Hodgkin lymphoma, unspecified site: Secondary | ICD-10-CM | POA: Diagnosis not present

## 2018-08-14 DIAGNOSIS — E1122 Type 2 diabetes mellitus with diabetic chronic kidney disease: Secondary | ICD-10-CM | POA: Diagnosis present

## 2018-08-14 DIAGNOSIS — I080 Rheumatic disorders of both mitral and aortic valves: Secondary | ICD-10-CM | POA: Diagnosis present

## 2018-08-14 DIAGNOSIS — Z8249 Family history of ischemic heart disease and other diseases of the circulatory system: Secondary | ICD-10-CM | POA: Diagnosis not present

## 2018-08-14 DIAGNOSIS — E119 Type 2 diabetes mellitus without complications: Secondary | ICD-10-CM | POA: Diagnosis not present

## 2018-08-14 DIAGNOSIS — I10 Essential (primary) hypertension: Secondary | ICD-10-CM | POA: Diagnosis not present

## 2018-08-14 DIAGNOSIS — M79604 Pain in right leg: Secondary | ICD-10-CM

## 2018-08-14 DIAGNOSIS — E1151 Type 2 diabetes mellitus with diabetic peripheral angiopathy without gangrene: Secondary | ICD-10-CM | POA: Diagnosis not present

## 2018-08-14 DIAGNOSIS — L0390 Cellulitis, unspecified: Secondary | ICD-10-CM | POA: Diagnosis not present

## 2018-08-14 DIAGNOSIS — I739 Peripheral vascular disease, unspecified: Secondary | ICD-10-CM | POA: Diagnosis present

## 2018-08-14 DIAGNOSIS — I130 Hypertensive heart and chronic kidney disease with heart failure and stage 1 through stage 4 chronic kidney disease, or unspecified chronic kidney disease: Secondary | ICD-10-CM | POA: Diagnosis present

## 2018-08-14 DIAGNOSIS — Z79818 Long term (current) use of other agents affecting estrogen receptors and estrogen levels: Secondary | ICD-10-CM

## 2018-08-14 DIAGNOSIS — Z888 Allergy status to other drugs, medicaments and biological substances status: Secondary | ICD-10-CM

## 2018-08-14 DIAGNOSIS — I5042 Chronic combined systolic (congestive) and diastolic (congestive) heart failure: Secondary | ICD-10-CM | POA: Diagnosis present

## 2018-08-14 DIAGNOSIS — L89151 Pressure ulcer of sacral region, stage 1: Secondary | ICD-10-CM | POA: Diagnosis not present

## 2018-08-14 DIAGNOSIS — R52 Pain, unspecified: Secondary | ICD-10-CM

## 2018-08-14 DIAGNOSIS — L89312 Pressure ulcer of right buttock, stage 2: Secondary | ICD-10-CM | POA: Diagnosis present

## 2018-08-14 DIAGNOSIS — M7989 Other specified soft tissue disorders: Secondary | ICD-10-CM | POA: Diagnosis not present

## 2018-08-14 DIAGNOSIS — L03115 Cellulitis of right lower limb: Secondary | ICD-10-CM | POA: Diagnosis not present

## 2018-08-14 DIAGNOSIS — R296 Repeated falls: Secondary | ICD-10-CM | POA: Diagnosis not present

## 2018-08-14 LAB — COMPREHENSIVE METABOLIC PANEL
ALBUMIN: 3.3 g/dL — AB (ref 3.5–5.0)
ALT: 14 U/L (ref 0–44)
AST: 22 U/L (ref 15–41)
Alkaline Phosphatase: 65 U/L (ref 38–126)
Anion gap: 14 (ref 5–15)
BILIRUBIN TOTAL: 1.4 mg/dL — AB (ref 0.3–1.2)
BUN: 14 mg/dL (ref 8–23)
CO2: 24 mmol/L (ref 22–32)
Calcium: 8.6 mg/dL — ABNORMAL LOW (ref 8.9–10.3)
Chloride: 97 mmol/L — ABNORMAL LOW (ref 98–111)
Creatinine, Ser: 1.38 mg/dL — ABNORMAL HIGH (ref 0.44–1.00)
GFR calc Af Amer: 39 mL/min — ABNORMAL LOW (ref >60)
GFR calc non Af Amer: 34 mL/min — ABNORMAL LOW (ref >60)
Glucose, Bld: 87 mg/dL (ref 70–99)
POTASSIUM: 3 mmol/L — AB (ref 3.5–5.1)
Sodium: 135 mmol/L (ref 135–145)
Total Protein: 5.4 g/dL — ABNORMAL LOW (ref 6.5–8.1)

## 2018-08-14 LAB — CBC WITH DIFFERENTIAL/PLATELET
Abs Immature Granulocytes: 0.04 K/uL (ref 0.00–0.07)
Basophils Absolute: 0 K/uL (ref 0.0–0.1)
Basophils Relative: 0 %
Eosinophils Absolute: 0.1 K/uL (ref 0.0–0.5)
Eosinophils Relative: 1 %
HCT: 33.3 % — ABNORMAL LOW (ref 36.0–46.0)
Hemoglobin: 11.1 g/dL — ABNORMAL LOW (ref 12.0–15.0)
Immature Granulocytes: 1 %
Lymphocytes Relative: 21 %
Lymphs Abs: 1.6 K/uL (ref 0.7–4.0)
MCH: 34.9 pg — ABNORMAL HIGH (ref 26.0–34.0)
MCHC: 33.3 g/dL (ref 30.0–36.0)
MCV: 104.7 fL — ABNORMAL HIGH (ref 80.0–100.0)
Monocytes Absolute: 0.8 K/uL (ref 0.1–1.0)
Monocytes Relative: 11 %
Neutro Abs: 4.9 K/uL (ref 1.7–7.7)
Neutrophils Relative %: 66 %
Platelets: 130 K/uL — ABNORMAL LOW (ref 150–400)
RBC: 3.18 MIL/uL — ABNORMAL LOW (ref 3.87–5.11)
RDW: 14.6 % (ref 11.5–15.5)
WBC: 7.4 K/uL (ref 4.0–10.5)
nRBC: 0 % (ref 0.0–0.2)

## 2018-08-14 LAB — BRAIN NATRIURETIC PEPTIDE: B Natriuretic Peptide: 786.7 pg/mL — ABNORMAL HIGH (ref 0.0–100.0)

## 2018-08-14 LAB — I-STAT TROPONIN, ED: Troponin i, poc: 0.03 ng/mL (ref 0.00–0.08)

## 2018-08-14 MED ORDER — PIPERACILLIN-TAZOBACTAM 3.375 G IVPB 30 MIN
3.3750 g | Freq: Once | INTRAVENOUS | Status: AC
  Administered 2018-08-14: 3.375 g via INTRAVENOUS
  Filled 2018-08-14: qty 50

## 2018-08-14 MED ORDER — POTASSIUM CHLORIDE CRYS ER 20 MEQ PO TBCR
40.0000 meq | EXTENDED_RELEASE_TABLET | Freq: Once | ORAL | Status: AC
  Administered 2018-08-14: 40 meq via ORAL
  Filled 2018-08-14: qty 2

## 2018-08-14 NOTE — ED Provider Notes (Signed)
[facility] EMERGENCY DEPARTMENT Provider Note   CSN: [id number] Arrival date & time: 08/14/18  1931     History   Chief Complaint Chief Complaint  Patient presents with  . Leg Swelling    Cellulitus in both feet, per physical therapy    HPI Lisa James is a 85 y.o. female.  The history is provided by the patient, a relative and medical records. No language interpreter was used.  Rash  Location:  Foot and leg Leg rash location:  R lower leg and R ankle Foot rash location:  R foot Severity:  Moderate Onset quality:  Gradual Duration:  1 week Timing:  Constant Progression:  Worsening Chronicity:  New Relieved by:  Nothing Worsened by:  Nothing Associated symptoms: induration   Associated symptoms: no abdominal pain, no diarrhea, no fatigue, no fever, no headaches, no nausea, no shortness of breath, not vomiting and not wheezing     Past Medical History:  Diagnosis Date  . Acute MI, lateral wall ([place])   . Aortic stenosis   . Chronic kidney disease   . Chronic systolic heart failure ([place])   . Coronary artery disease    sees Dr Claiborne Billings every 6 months  . Dementia ([place])   . Diabetes ([place])   . DM (diabetes mellitus) ([place])    type 2 ; diet controlled  . Heart disease   . HTN (hypertension)   . 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 Ischemic heart disease   . Kidney disease   . Mitral regurgitation   . Non Hodgkin's lymphoma ([place])    of the throat  . Subdural hematoma, post-traumatic ([place]) 2015   sees Dr Sherwood Gambler every 6 months  . Thrombocytopenia ([place])    sees Dr Marin Olp every 2 weeks ;receives Nplate prn    Patient Active Problem List   Diagnosis Date Noted  . Protein-calorie malnutrition, severe 07/13/2018  . Closed right hip fracture, initial encounter ([place]) 07/12/2018  . Acute on chronic anemia 06/05/2018  . Chronic diastolic CHF (congestive heart failure) ([place]) 06/05/2018  . Closed fracture of right proximal humerus  05/30/2018  . Proximal humerus fracture 05/28/2018  . Pressure injury of skin 05/15/2018  . Fall 05/14/2018  . Closed comminuted right humeral fracture 05/14/2018  . UTI (urinary tract infection) 05/14/2018  . Hyponatremia 05/14/2018  . Leucocytosis 05/14/2018  . Altered mental status 05/13/2015  . Idiopathic thrombocytopenic purpura ([place]) 07/06/2013  . Aortic valve stenosis 06/19/2013  . Immune thrombocytopenic purpura ([place]) 01/21/2013  . GERD (gastroesophageal reflux disease) 12/17/2012  . Type II or unspecified type diabetes mellitus with renal manifestations, not stated as uncontrolled(250.40) 12/10/2012  . Chronic kidney disease (CKD), stage III (moderate) ([place]) 11/22/2012  . Diabetes mellitus ([place]) 07/03/2011  . Depression 07/03/2011  . Non-Hodgkin's lymphoma ([place]) 07/03/2011  . CAD (coronary artery disease) 07/03/2011  . PAD (peripheral artery disease) ([place]) 07/03/2011  . Vitamin D deficiency 07/03/2011  . Subdural hematoma ([place]) 07/03/2011  . Confusion 05/12/2011  . Subdural hematoma, acute ([place]) 05/12/2011  . HYPERLIPIDEMIA TYPE IIB / III 05/10/2009  . OLD MYOCARDIAL INFARCTION 05/10/2009  . Occlusion and stenosis of carotid artery without mention of cerebral infarction 05/10/2009  . HYPERTENSION, BENIGN 11/03/2008  . MITRAL REGURGITATION 10/29/2008  . ISCHEMIC HEART DISEASE 10/29/2008  . SYSTOLIC HEART FAILURE, CHRONIC 10/29/2008    Past Surgical History:  Procedure Laterality Date  . BONE MARROW BIOPSY  11/22/2002   left posterior iliac crest bone marrow biopsy and aspirate  .  CATARACT EXTRACTION W/ INTRAOCULAR LENS  IMPLANT, BILATERAL Bilateral   . CORONARY ARTERY BYPASS GRAFT  11/20/2007   x5  . EYE SURGERY    . ORIF HUMERUS FRACTURE Right 05/28/2018   Procedure: RIGHT OPEN REDUCTION INTERNAL FIXATION (ORIF) PROXIMAL HUMERUS FRACTURE;  Surgeon: Meredith Pel, MD;  Location: [facility];  Service: Orthopedics;  Laterality: Right;  . submental lymph node excisional  biopsy  10/22/2002     OB History   No obstetric history on file.      Home Medications    Prior to Admission medications   Medication Sig Start Date End Date Taking? Authorizing Provider  atorvastatin (LIPITOR) 20 MG tablet TAKE 1 TABLET (20 MG TOTAL) BY MOUTH DAILY. Patient taking differently: Take 20 mg by mouth at bedtime.  01/29/16   Darlyne Russian, MD  Calcium Carbonate-Vitamin D (CALCIUM-VITAMIN D) 500-200 MG-UNIT per tablet Take 1 tablet by mouth daily.     [provider]  carvedilol (COREG) 6.25 MG tablet Take 1 tablet (6.25 mg total) by mouth 2 (two) times daily with a meal. 07/28/15   Daub, Loura Back, MD  Cholecalciferol (VITAMIN D) 1000 UNITS capsule Take 1,000 Units by mouth at bedtime.     [provider]  docusate sodium (COLACE) 100 MG capsule Take 1 capsule (100 mg total) by mouth 2 (two) times daily. 06/01/18   Meredith Pel, MD  eltrombopag (PROMACTA) 50 MG tablet Take 100 mg by mouth daily. Take on an empty stomach 1 hour before a meal or 2 hours after    [provider]  feeding supplement, ENSURE ENLIVE, (ENSURE ENLIVE) LIQD Take 237 mLs by mouth 3 (three) times daily between meals. 07/16/18   Hosie Poisson, MD  HYDROcodone-acetaminophen (NORCO/VICODIN) 5-325 MG tablet Take 1 tablet by mouth every 6 (six) hours as needed (for pain). 06/07/18   Mariel Aloe, MD  LACTOBACILLUS PO Take 2 capsules by mouth 3 (three) times daily.    [provider]  methocarbamol (ROBAXIN) 500 MG tablet Take 1 tablet (500 mg total) by mouth every 6 (six) hours as needed for muscle spasms. 05/29/18   Meredith Pel, MD  Multiple Vitamins-Minerals (PRESERVISION AREDS PO) Take 1 tablet by mouth daily.    [provider]  OVER THE COUNTER MEDICATION Aquacel Dressing: "Maintain to right shoulder until ortho follow-up" 06/01/18   [provider]  PROMACTA 75 MG tablet TAKE 1 TABLET (75 MG TOTAL) BY MOUTH DAILY. TAKE ON AN EMPTY STOMACH  1 HOUR BEFORE MEALS OR 2 HOURS AFTER. 08/10/18   Truitt Merle, MD  sertraline (ZOLOFT) 25 MG tablet Take 25 mg by mouth daily.    [provider]  spironolactone (ALDACTONE) 25 MG tablet Take 25 mg by mouth daily.    [provider]  talc (ZEASORB) powder Apply 1 application topically every 12 (twelve) hours as needed (UNDER BREASTS, for irritation).    [provider]  traZODone (DESYREL) 50 MG tablet Take 50-100 mg by mouth at bedtime as needed for sleep (for 14 days- until 06/15/18).  06/01/18 07/12/18  [provider]    Family History Family History  Problem Relation Age of Onset  . Heart attack Father   . Diabetes Father     Social History Social History   Tobacco Use  . Smoking status: Never Smoker  . Smokeless tobacco: Never Used  . Tobacco comment: never used tobacco.  Substance Use Topics  . Alcohol use: No    Alcohol/week:  0.0 standard drinks  . Drug use: No     Allergies   Ace inhibitors   Review of Systems Review of Systems  Constitutional: Negative for chills, diaphoresis, fatigue and fever.  HENT: Negative for congestion.   Eyes: Negative for visual disturbance.  Respiratory: Negative for cough, chest tightness, shortness of breath and wheezing.   Gastrointestinal: Negative for abdominal pain, diarrhea, nausea and vomiting.  Genitourinary: Negative for dysuria.  Musculoskeletal: Negative for back pain, neck pain and neck stiffness.  Skin: Positive for rash and wound.  Neurological: Negative for headaches.  Psychiatric/Behavioral: Negative for agitation.  All other systems reviewed and are negative.    Physical Exam Updated Vital Signs BP (!) 136/94 (BP Location: Right Arm)   Pulse 76   Temp 98.9 F (37.2 C) (Oral)   Resp 16   LMP  (LMP Unknown)   SpO2 100%   Physical Exam Vitals signs and nursing note reviewed.  Constitutional:      General: She is not in acute distress.    Appearance: She is well-developed.    HENT:     Head: Normocephalic and atraumatic.     Nose: No congestion or rhinorrhea.     Mouth/Throat:     Pharynx: No oropharyngeal exudate or posterior oropharyngeal erythema.  Eyes:     Extraocular Movements: Extraocular movements intact.     Conjunctiva/sclera: Conjunctivae normal.     Pupils: Pupils are equal, round, and reactive to light.  Neck:     Musculoskeletal: Neck supple.  Cardiovascular:     Rate and Rhythm: Normal rate and regular rhythm.     Heart sounds: Murmur present.  Pulmonary:     Effort: Pulmonary effort is normal. No respiratory distress.     Breath sounds: Normal breath sounds. No wheezing, rhonchi or rales.  Chest:     Chest wall: No tenderness.  Abdominal:     Palpations: Abdomen is soft.     Tenderness: There is no abdominal tenderness.  Musculoskeletal:     Right lower leg: Edema present.     Left lower leg: Edema present.       Feet:  Skin:    General: Skin is warm and dry.     Capillary Refill: Capillary refill takes less than 2 seconds.     Findings: Erythema present.  Neurological:     General: No focal deficit present.     Mental Status: She is alert.  Psychiatric:        Mood and Affect: Mood normal.           ED Treatments / Results  Labs (all labs ordered are listed, but only abnormal results are displayed) Labs Reviewed  CBC WITH DIFFERENTIAL/PLATELET - Abnormal; Notable for the following components:      Result Value   RBC 3.18 (*)    Hemoglobin 11.1 (*)    HCT 33.3 (*)    MCV 104.7 (*)    MCH 34.9 (*)    Platelets 130 (*)    All other components within normal limits  COMPREHENSIVE METABOLIC PANEL - Abnormal; Notable for the following components:   Potassium 3.0 (*)    Chloride 97 (*)    Creatinine, Ser 1.38 (*)    Calcium 8.6 (*)    Total Protein 5.4 (*)    Albumin 3.3 (*)    Total Bilirubin 1.4 (*)    GFR calc non Af Amer 34 (*)    GFR calc Af Amer 39 (*)  All other components within normal limits  BRAIN  NATRIURETIC PEPTIDE - Abnormal; Notable for the following components:   B Natriuretic Peptide 786.7 (*)    All other components within normal limits  CULTURE, BLOOD (ROUTINE X 2)  CULTURE, BLOOD (ROUTINE X 2)  I-STAT TROPONIN, ED    EKG EKG Interpretation  Date/Time:  Friday August 14 2018 21:08:16 EST Ventricular Rate:  76 PR Interval:    QRS Duration: 132 QT Interval:  419 QTC Calculation: 472 R Axis:   -44 Text Interpretation:  Sinus or ectopic atrial rhythm Prolonged PR interval LVH with IVCD, LAD and secondary repol abnrm Anterior ST elevation, probably due to LVH When comapred to prior, no signficant chagnes seen.  No STEMI Confirmed by Antony Blackbird [phone]) on 08/14/2018 10:05:25 PM   Radiology Dg Chest 1 View  Result Date: 08/14/2018 CLINICAL DATA:  Redness and blisters at the right ankle swelling of both legs EXAM: CHEST  1 VIEW COMPARISON:  05/14/2018 FINDINGS: Post sternotomy changes. Mild cardiomegaly. No acute consolidation or effusion. Aortic atherosclerosis. No pneumothorax. Surgical plate and fixating screws in the proximal right humerus. Old right-sided rib fractures. IMPRESSION: No active disease.  Cardiomegaly Electronically Signed   By: Donavan Foil M.D.   On: 08/14/2018 20:55   Dg Foot Complete Right  Result Date: 08/14/2018 CLINICAL DATA:  Foot swelling and redness EXAM: RIGHT FOOT COMPLETE - 3+ VIEW COMPARISON:  None. FINDINGS: No fracture or malalignment. No periostitis or bone destruction. Vascular calcifications. No soft tissue emphysema. Moderate arthritis at the first MTP joint. Degenerative narrowing at the DIP and PIP joints. IMPRESSION: No acute osseous abnormality. Electronically Signed   By: Donavan Foil M.D.   On: 08/14/2018 20:57    Procedures Procedures (including critical care time)  .CRITICAL CARE Performed by: Gwenyth Allegra  Total critical care time: 35 minutes Critical care time was exclusive of separately billable procedures and  treating other patients. Critical care was necessary to treat or prevent imminent or life-threatening deterioration. Critical care was time spent personally by me on the following activities: development of treatment plan with patient and/or surrogate as well as nursing, discussions with consultants, evaluation of patient's response to treatment, examination of patient, obtaining history from patient or surrogate, ordering and performing treatments and interventions, ordering and review of laboratory studies, ordering and review of radiographic studies, pulse oximetry and re-evaluation of patient's condition.   Medications Ordered in ED Medications  piperacillin-tazobactam (ZOSYN) IVPB 3.375 g (0 g Intravenous Stopped 08/14/18 2250)  potassium chloride SA (K-DUR,KLOR-CON) CR tablet 40 mEq (40 mEq Oral Given 08/14/18 2226)     Initial Impression / Assessment and Plan / ED Course  I have reviewed the triage vital signs and the nursing notes.  Pertinent labs & imaging results that were available during my care of the patient were reviewed by me and considered in my medical decision making (see chart for details).     Lisa James is a 85 y.o. female with a past medical history significant for diabetes, CAD status post CABG, CKD, CHF, prior ITP, dementia, and hypertension who presents with concern for leg infection.  Patient says that she has had bilateral leg edema for the last few weeks and in the right leg started having some blisters pop up.  She was told to start using a diuretic by her PCP recently which she is been taking.  She reports that the swelling has persisted and over the last  several days redness has been worsening  from the blisters.  The family reports that her last 24 hours the redness has been creeping up past her ankle up her leg.  She was seen by physical therapy today and they were very concerned about worsening cellulitis in the patient.  Patient denies significant fevers,  chills, chest pain, shortness breath, congestion, cough.  She denies any urinary symptoms.  She denies significant pain but family is very concerned about the infection spreading up her leg.  On exam, patient does have multiple blisters on her right foot with spreading erythema going proximal.  There is pitting edema in both legs.  Patient had no tenderness.  Patient normal sensation and strength in the legs.  Palpable DP pulse bilaterally.  Lungs were clear and chest was nontender.  Murmur was appreciated.  Abdomen nontender.  Patient will screen laboratory testing to look for signs of systemic infection as well as blood cultures.  X-ray will be obtained to look for subcutaneous gas or osteomyelitis.  Patient will have labs look for worsening heart failure with the leg edema with a BNP and a chest x-ray.  Due to the redness spreading up her leg in a diabetic with multiple medical problems worsening over the last 24 hours, anticipate admission for IV antibiotics for diabetic cellulitis.  10:13 PM Patient's diagnostic laboratory testing began to return.  Patient was found to have normal troponin.  CBC shows no leukocytosis and mild anemia.  Patient's electrolytes showed hypokalemia 3.0 and elevated creatinine 1.3  Given the patient's worsening cellulitis of the last 24 hours and diabetes, patient will be given IV antibiotics and admitted for further monitoring.   Final Clinical Impressions(s) / ED Diagnoses   Final diagnoses:  Cellulitis of right lower extremity  Leg edema    ED Discharge Orders    None     Clinical Impression: 1. Cellulitis of right lower extremity   2. Pain   3. Leg edema     Disposition: Admit  This note was prepared with assistance of Dragon voice recognition software. Occasional wrong-word or sound-a-like substitutions may have occurred due to the inherent limitations of voice recognition software.     , Gwenyth Allegra, MD 08/14/18 [phone]

## 2018-08-14 NOTE — ED Triage Notes (Signed)
Pt arrived from [facility] with swelling to both lower legs. Per EMS, Her physical therapist referred to the hospital after evaluating the pt. EMS v/s 102/60, 60hr. Pt is at her cognitive baseline, per facility staff.

## 2018-08-15 ENCOUNTER — Encounter (HOSPITAL_COMMUNITY): Payer: Self-pay | Admitting: Internal Medicine

## 2018-08-15 ENCOUNTER — Inpatient Hospital Stay (HOSPITAL_COMMUNITY): Payer: Medicare Other

## 2018-08-15 DIAGNOSIS — I5032 Chronic diastolic (congestive) heart failure: Secondary | ICD-10-CM

## 2018-08-15 DIAGNOSIS — L0390 Cellulitis, unspecified: Secondary | ICD-10-CM | POA: Diagnosis present

## 2018-08-15 DIAGNOSIS — L03115 Cellulitis of right lower limb: Principal | ICD-10-CM

## 2018-08-15 DIAGNOSIS — R609 Edema, unspecified: Secondary | ICD-10-CM

## 2018-08-15 LAB — COMPREHENSIVE METABOLIC PANEL
ALT: 15 U/L (ref 0–44)
AST: 21 U/L (ref 15–41)
Albumin: 2.8 g/dL — ABNORMAL LOW (ref 3.5–5.0)
Alkaline Phosphatase: 54 U/L (ref 38–126)
Anion gap: 10 (ref 5–15)
BUN: 13 mg/dL (ref 8–23)
CO2: 23 mmol/L (ref 22–32)
Calcium: 8.1 mg/dL — ABNORMAL LOW (ref 8.9–10.3)
Chloride: 101 mmol/L (ref 98–111)
Creatinine, Ser: 1.35 mg/dL — ABNORMAL HIGH (ref 0.44–1.00)
GFR calc Af Amer: 41 mL/min — ABNORMAL LOW (ref >60)
GFR, EST NON AFRICAN AMERICAN: 35 mL/min — AB (ref >60)
Glucose, Bld: 75 mg/dL (ref 70–99)
Potassium: 3.2 mmol/L — ABNORMAL LOW (ref 3.5–5.1)
Sodium: 134 mmol/L — ABNORMAL LOW (ref 135–145)
Total Bilirubin: 1.8 mg/dL — ABNORMAL HIGH (ref 0.3–1.2)
Total Protein: 4.6 g/dL — ABNORMAL LOW (ref 6.5–8.1)

## 2018-08-15 LAB — CBC WITH DIFFERENTIAL/PLATELET
ABS IMMATURE GRANULOCYTES: 0.06 10*3/uL (ref 0.00–0.07)
Basophils Absolute: 0 10*3/uL (ref 0.0–0.1)
Basophils Relative: 0 %
Eosinophils Absolute: 0.1 10*3/uL (ref 0.0–0.5)
Eosinophils Relative: 2 %
HCT: 29.2 % — ABNORMAL LOW (ref 36.0–46.0)
Hemoglobin: 10.7 g/dL — ABNORMAL LOW (ref 12.0–15.0)
IMMATURE GRANULOCYTES: 1 %
Lymphocytes Relative: 23 %
Lymphs Abs: 1.5 10*3/uL (ref 0.7–4.0)
MCH: 36.3 pg — ABNORMAL HIGH (ref 26.0–34.0)
MCHC: 36.6 g/dL — ABNORMAL HIGH (ref 30.0–36.0)
MCV: 99 fL (ref 80.0–100.0)
MONOS PCT: 11 %
Monocytes Absolute: 0.7 10*3/uL (ref 0.1–1.0)
Neutro Abs: 4.1 10*3/uL (ref 1.7–7.7)
Neutrophils Relative %: 63 %
Platelets: 97 10*3/uL — ABNORMAL LOW (ref 150–400)
RBC: 2.95 MIL/uL — ABNORMAL LOW (ref 3.87–5.11)
RDW: 14.3 % (ref 11.5–15.5)
WBC: 6.4 10*3/uL (ref 4.0–10.5)
nRBC: 0 % (ref 0.0–0.2)

## 2018-08-15 LAB — GLUCOSE, CAPILLARY
GLUCOSE-CAPILLARY: 81 mg/dL (ref 70–99)
Glucose-Capillary: 85 mg/dL (ref 70–99)
Glucose-Capillary: 97 mg/dL (ref 70–99)
Glucose-Capillary: 92 mg/dL (ref 70–99)

## 2018-08-15 LAB — MRSA PCR SCREENING: MRSA by PCR: POSITIVE — AB

## 2018-08-15 MED ORDER — SPIRONOLACTONE 25 MG PO TABS
25.0000 mg | ORAL_TABLET | Freq: Every day | ORAL | Status: DC
  Administered 2018-08-15 – 2018-08-16 (×2): 25 mg via ORAL
  Filled 2018-08-15 (×2): qty 1

## 2018-08-15 MED ORDER — CARVEDILOL 6.25 MG PO TABS
6.2500 mg | ORAL_TABLET | Freq: Two times a day (BID) | ORAL | Status: DC
  Administered 2018-08-15 – 2018-08-17 (×5): 6.25 mg via ORAL
  Filled 2018-08-15 (×5): qty 1

## 2018-08-15 MED ORDER — VANCOMYCIN HCL IN DEXTROSE 750-5 MG/150ML-% IV SOLN
750.0000 mg | Freq: Once | INTRAVENOUS | Status: AC
  Administered 2018-08-15: 750 mg via INTRAVENOUS
  Filled 2018-08-15 (×2): qty 150

## 2018-08-15 MED ORDER — ACETAMINOPHEN 650 MG RE SUPP: 650.0000 mg | Freq: Four times a day (QID) | RECTAL | Status: DC | PRN

## 2018-08-15 MED ORDER — ATORVASTATIN CALCIUM 10 MG PO TABS
20.0000 mg | ORAL_TABLET | Freq: Every day | ORAL | Status: DC
  Administered 2018-08-15 – 2018-08-16 (×2): 20 mg via ORAL
  Filled 2018-08-15 (×2): qty 2

## 2018-08-15 MED ORDER — SODIUM CHLORIDE 0.9 % IV SOLN
INTRAVENOUS | Status: DC | PRN
  Administered 2018-08-15: 250 mL via INTRAVENOUS

## 2018-08-15 MED ORDER — ELTROMBOPAG OLAMINE 75 MG PO TABS: 75.0000 mg | ORAL_TABLET | Freq: Every day | ORAL | Status: DC

## 2018-08-15 MED ORDER — VANCOMYCIN HCL 500 MG IV SOLR
500.0000 mg | INTRAVENOUS | Status: DC
  Filled 2018-08-15: qty 500

## 2018-08-15 MED ORDER — INSULIN ASPART 100 UNIT/ML ~~LOC~~ SOLN: 0.0000 [IU] | Freq: Three times a day (TID) | SUBCUTANEOUS | Status: DC

## 2018-08-15 MED ORDER — SERTRALINE HCL 50 MG PO TABS
25.0000 mg | ORAL_TABLET | Freq: Every day | ORAL | Status: DC
  Administered 2018-08-15 – 2018-08-17 (×3): 25 mg via ORAL
  Filled 2018-08-15 (×3): qty 1

## 2018-08-15 MED ORDER — ONDANSETRON HCL 4 MG/2ML IJ SOLN: 4.0000 mg | Freq: Four times a day (QID) | INTRAMUSCULAR | Status: DC | PRN

## 2018-08-15 MED ORDER — MEGESTROL ACETATE 400 MG/10ML PO SUSP
400.0000 mg | Freq: Two times a day (BID) | ORAL | Status: DC
  Administered 2018-08-15 – 2018-08-17 (×5): 400 mg via ORAL
  Filled 2018-08-15 (×6): qty 10

## 2018-08-15 MED ORDER — ACETAMINOPHEN 325 MG PO TABS: 650.0000 mg | ORAL_TABLET | Freq: Four times a day (QID) | ORAL | Status: DC | PRN

## 2018-08-15 MED ORDER — MUPIROCIN 2 % EX OINT
1.0000 "application " | TOPICAL_OINTMENT | Freq: Two times a day (BID) | CUTANEOUS | Status: DC
  Administered 2018-08-15 – 2018-08-17 (×5): 1 via NASAL
  Filled 2018-08-15: qty 22

## 2018-08-15 MED ORDER — ONDANSETRON HCL 4 MG PO TABS: 4.0000 mg | ORAL_TABLET | Freq: Four times a day (QID) | ORAL | Status: DC | PRN

## 2018-08-15 MED ORDER — ENOXAPARIN SODIUM 30 MG/0.3ML ~~LOC~~ SOLN
30.0000 mg | SUBCUTANEOUS | Status: DC
  Administered 2018-08-15 – 2018-08-17 (×3): 30 mg via SUBCUTANEOUS
  Filled 2018-08-15 (×3): qty 0.3

## 2018-08-15 MED ORDER — TRAZODONE HCL 50 MG PO TABS
50.0000 mg | ORAL_TABLET | Freq: Once | ORAL | Status: AC
  Administered 2018-08-15: 50 mg via ORAL
  Filled 2018-08-15: qty 1

## 2018-08-15 MED ORDER — PIPERACILLIN-TAZOBACTAM 3.375 G IVPB
3.3750 g | Freq: Three times a day (TID) | INTRAVENOUS | Status: DC
  Administered 2018-08-15 – 2018-08-16 (×4): 3.375 g via INTRAVENOUS
  Filled 2018-08-15 (×5): qty 50

## 2018-08-15 MED ORDER — HYDROCODONE-ACETAMINOPHEN 5-325 MG PO TABS: 1.0000 | ORAL_TABLET | Freq: Four times a day (QID) | ORAL | Status: DC | PRN

## 2018-08-15 MED ORDER — CHLORHEXIDINE GLUCONATE CLOTH 2 % EX PADS
6.0000 | MEDICATED_PAD | Freq: Every day | CUTANEOUS | Status: DC
  Administered 2018-08-15 – 2018-08-17 (×3): 6 via TOPICAL

## 2018-08-15 MED ORDER — METHOCARBAMOL 500 MG PO TABS: 500.0000 mg | ORAL_TABLET | Freq: Four times a day (QID) | ORAL | Status: DC | PRN

## 2018-08-15 MED ORDER — FUROSEMIDE 20 MG PO TABS
20.0000 mg | ORAL_TABLET | Freq: Every day | ORAL | Status: DC
  Administered 2018-08-15 – 2018-08-16 (×2): 20 mg via ORAL
  Filled 2018-08-15 (×2): qty 1

## 2018-08-15 MED ORDER — ENOXAPARIN SODIUM 40 MG/0.4ML ~~LOC~~ SOLN: 40.0000 mg | SUBCUTANEOUS | Status: DC

## 2018-08-15 MED ORDER — TRAZODONE HCL 50 MG PO TABS
50.0000 mg | ORAL_TABLET | Freq: Every day | ORAL | Status: DC
  Administered 2018-08-15: 50 mg via ORAL
  Filled 2018-08-15: qty 1

## 2018-08-15 NOTE — Progress Notes (Signed)
Mitts placed on patient-  Check fit- able to fit 2 fingers between skin.  IV wrapped with gauze.

## 2018-08-15 NOTE — Progress Notes (Signed)
LE venous duplex       has been completed. Preliminary results can be found under CV proc through chart review. Maxyne Derocher, BS, RDMS, RVT   

## 2018-08-15 NOTE — Progress Notes (Signed)
Unable to do admission assessment, pt is only oriented to self, no family at bedside.

## 2018-08-15 NOTE — Progress Notes (Signed)
Patient seen and examined at bedside, patient admitted after midnight, please see earlier detailed admission note by Rise Patience, MD. Briefly, patient presented with erythema of her right leg concerning for cellulitis. She has been started on IV antibiotics and blood cultures obtained. LE venous duplex ordered which was negative for acute DVT. No changes to management today.   Cordelia Poche, MD [facility] 08/15/2018, 2:13 PM

## 2018-08-15 NOTE — Progress Notes (Signed)
   08/15/18 0041  Vitals  Temp 98.7 F (37.1 C)  Temp Source Oral  BP (!) 125/55  MAP (mmHg) 72  BP Location Right Arm  BP Method Automatic  Patient Position (if appropriate) Lying  Pulse Rate 66  Resp 18  Oxygen Therapy  SpO2 100 %  O2 Device Room Air  Height and Weight  Height 5' (1.524 m)  Weight 44.5 kg  Type of Scale Used Bed  Type of Weight Actual  BSA (Calculated - sq m) 1.37 sq meters  BMI (Calculated) 19.14  Weight in (lb) to have BMI = 25 127.7  MEWS Score  MEWS RR 0  MEWS Pulse 0  MEWS Systolic 0  MEWS LOC 0  MEWS Temp 0  MEWS Score 0  MEWS Score Color Green  Admitted pt from ED to rm 3E12, oriented to room, call bell placed wihin reach, placed on cardiac monitor, CCMD made aware.pt alert to self only. Placed on low bed, floor mat in placed.

## 2018-08-15 NOTE — H&P (Addendum)
History and Physical    Lisa James FMB:[id number] DOB: [birth year] DOA: 08/14/2018  PCP: Merrilee Seashore, MD  Patient coming from: Skilled nursing facility.  Chief Complaint: Increasing swelling and erythema of the right lower extremity.  HPI: Lisa James is a 85 y.o. female with history of CHF, diabetes mellitus type 2 on diet, chronic thrombocytopenia and history of non-Hodgkin's lymphoma, dementia, chronic kidney disease stage III who was recently admitted last month for right hip fracture and also a right shoulder fracture recently presents to the ER after patient's family found the patient has been having increasing redness and swelling of the right lower extremity.  Patient recently was found to have bilateral lower extremity edema and the patient was placed on Lasix.  Patient eventually started developing blisters and some of them popped and right lower extremity started to be erythema and increasing swelling and was brought to the ER.  ED Course: In the ER on exam patient has a small ulceration on the dorsal aspect of her right foot.  Erythema extends up to the mid calf.  Given the clinical features consistent with cellulitis patient was placed on antibiotics admitted for further management.  Lab work appears to be at baseline.  Patient is demented and does not contribute to history.  From the reports patient may have been recently placed on doxycycline for the cellulitis.  Review of Systems: As per HPI, rest all negative.   Past Medical History:  Diagnosis Date  . Acute MI, lateral wall ([place])   . Aortic stenosis   . Chronic kidney disease   . Chronic systolic heart failure ([place])   . Coronary artery disease    sees Dr Claiborne Billings every 6 months  . Dementia ([place])   . Diabetes ([place])   . DM (diabetes mellitus) ([place])    type 2 ; diet controlled  . Heart disease   . HTN (hypertension)   . Immune thrombocytopenic purpura ([place]) 01/21/2013  . Ischemic heart disease   .  Kidney disease   . Mitral regurgitation   . Non Hodgkin's lymphoma ([place])    of the throat  . Subdural hematoma, post-traumatic ([place]) 2015   sees Dr Sherwood Gambler every 6 months  . Thrombocytopenia ([place])    sees Dr Marin Olp every 2 weeks ;receives Nplate prn    Past Surgical History:  Procedure Laterality Date  . BONE MARROW BIOPSY  11/22/2002   left posterior iliac crest bone marrow biopsy and aspirate  . CATARACT EXTRACTION W/ INTRAOCULAR LENS  IMPLANT, BILATERAL Bilateral   . CORONARY ARTERY BYPASS GRAFT  11/20/2007   x5  . EYE SURGERY    . ORIF HUMERUS FRACTURE Right 05/28/2018   Procedure: RIGHT OPEN REDUCTION INTERNAL FIXATION (ORIF) PROXIMAL HUMERUS FRACTURE;  Surgeon: Meredith Pel, MD;  Location: [facility];  Service: Orthopedics;  Laterality: Right;  . submental lymph node excisional biopsy  10/22/2002     reports that she has never smoked. She has never used smokeless tobacco. She reports that she does not drink alcohol or use drugs.  Allergies  Allergen Reactions  . Ace Inhibitors Cough    Family History  Problem Relation Age of Onset  . Heart attack Father   . Diabetes Father     Prior to Admission medications   Medication Sig Start Date End Date Taking? Authorizing Provider  atorvastatin (LIPITOR) 20 MG tablet TAKE 1 TABLET (20 MG TOTAL) BY MOUTH DAILY. Patient taking differently: Take 20 mg by mouth at bedtime.  01/29/16  Yes Darlyne Russian, MD  Calcium Carbonate-Vitamin D (CALCIUM-VITAMIN D) 500-200 MG-UNIT per tablet Take 1 tablet by mouth daily.    Yes [provider]  Carboxymethylcellul-Glycerin (REFRESH OPTIVE) 1-0.9 % GEL Place 2 drops into both eyes 3 (three) times daily.   Yes [provider]  carvedilol (COREG) 6.25 MG tablet Take 1 tablet (6.25 mg total) by mouth 2 (two) times daily with a meal. 07/28/15  Yes Daub, Loura Back, MD  ciprofloxacin (CILOXAN) 0.3 % ophthalmic solution Place 1 drop into the left eye daily. Administer 1 drop, every 2  hours, while awake, for 2 days. Then 1 drop, every 4 hours, while awake, for the next 5 days.   Yes [provider]  doxycycline (VIBRA-TABS) 100 MG tablet Take 100 mg by mouth 2 (two) times daily.   Yes [provider]  furosemide (LASIX) 20 MG tablet Take 20 mg by mouth daily.   Yes [provider]  HYDROcodone-acetaminophen (NORCO/VICODIN) 5-325 MG tablet Take 1 tablet by mouth every 6 (six) hours as needed (for pain). 06/07/18  Yes Mariel Aloe, MD  LACTOBACILLUS PO Take 2 capsules by mouth 3 (three) times daily.   Yes [provider]  megestrol (MEGACE) 40 MG/ML suspension Take 400 mg by mouth 2 (two) times daily.   Yes [provider]  methocarbamol (ROBAXIN) 500 MG tablet Take 1 tablet (500 mg total) by mouth every 6 (six) hours as needed for muscle spasms. 05/29/18  Yes Meredith Pel, MD  Multiple Vitamins-Minerals (PRESERVISION AREDS PO) Take 1 tablet by mouth daily.   Yes [provider]  PROMACTA 75 MG tablet TAKE 1 TABLET (75 MG TOTAL) BY MOUTH DAILY. TAKE ON AN EMPTY STOMACH 1 HOUR BEFORE MEALS OR 2 HOURS AFTER. Patient taking differently: Take 75 mg by mouth daily.  08/10/18  Yes Truitt Merle, MD  sertraline (ZOLOFT) 25 MG tablet Take 25 mg by mouth daily.   Yes [provider]  spironolactone (ALDACTONE) 25 MG tablet Take 25 mg by mouth daily.   Yes [provider]  talc (ZEASORB) powder Apply 1 application topically every 12 (twelve) hours as needed (UNDER BREASTS, for irritation).   Yes [provider]  traZODone (DESYREL) 50 MG tablet Take 50 mg by mouth at bedtime.  06/01/18 08/14/26 Yes [provider]  docusate sodium (COLACE) 100 MG capsule Take 1 capsule (100 mg total) by mouth 2 (two) times daily. Patient not taking: Reported on 08/14/2018 06/01/18   Meredith Pel, MD  feeding supplement, ENSURE ENLIVE, (ENSURE ENLIVE) LIQD Take 237 mLs by mouth 3 (three) times daily between  meals. Patient not taking: Reported on 08/14/2018 07/16/18   Hosie Poisson, MD    Physical Exam: Vitals:   08/14/18 2230 08/14/18 2300 08/14/18 2315 08/15/18 0041  BP: 137/73 (!) 133/52 131/64 (!) 125/55  Pulse: 73 69 73 66  Resp: '18 13 10 18  ' Temp:    98.7 F (37.1 C)  TempSrc:    Oral  SpO2: 99% 97% 96% 100%  Weight:    44.5 kg  Height:    5' (1.524 m)      Constitutional: Moderately built and nourished. Vitals:   08/14/18 2230 08/14/18 2300 08/14/18 2315 08/15/18 0041  BP: 137/73 (!) 133/52 131/64 (!) 125/55  Pulse: 73 69 73 66  Resp: '18 13 10 18  ' Temp:    98.7 F (37.1 C)  TempSrc:    Oral  SpO2: 99% 97% 96% 100%  Weight:    44.5 kg  Height:    5' (1.524 m)   Eyes: Anicteric no pallor. ENMT: No discharge from the ears eyes nose and mouth. Neck: No mass felt.  No neck rigidity no JVD appreciated. Respiratory: No rhonchi or crepitations. Cardiovascular: S1-S2 heard. Abdomen: Soft nontender bowel sounds present. Musculoskeletal: Lateral lower extremity more on the right side with erythema extending up to the midcalf.  Multiple ulcerations. Skin: Multiple ulcerations. Neurologic: Alert awake oriented to name.  At times agitated. Psychiatric: Oriented to name at times agitated.   Labs on Admission: I have personally reviewed following labs and imaging studies  CBC: Recent Labs  Lab 08/14/18 2100  WBC 7.4  NEUTROABS 4.9  HGB 11.1*  HCT 33.3*  MCV 104.7*  PLT 343*   Basic Metabolic Panel: Recent Labs  Lab 08/14/18 2100  NA 135  K 3.0*  CL 97*  CO2 24  GLUCOSE 87  BUN 14  CREATININE 1.38*  CALCIUM 8.6*   GFR: Estimated Creatinine Clearance: 19.8 mL/min (A) (by C-G formula based on SCr of 1.38 mg/dL (H)). Liver Function Tests: Recent Labs  Lab 08/14/18 2100  AST 22  ALT 14  ALKPHOS 65  BILITOT 1.4*  PROT 5.4*  ALBUMIN 3.3*   No results for input(s): LIPASE, AMYLASE in the last 168 hours. No results for input(s): AMMONIA in the last 168  hours. Coagulation Profile: No results for input(s): INR, PROTIME in the last 168 hours. Cardiac Enzymes: No results for input(s): CKTOTAL, CKMB, CKMBINDEX, TROPONINI in the last 168 hours. BNP (last 3 results) No results for input(s): PROBNP in the last 8760 hours. HbA1C: No results for input(s): HGBA1C in the last 72 hours. CBG: No results for input(s): GLUCAP in the last 168 hours. Lipid Profile: No results for input(s): CHOL, HDL, LDLCALC, TRIG, CHOLHDL, LDLDIRECT in the last 72 hours. Thyroid Function Tests: No results for input(s): TSH, T4TOTAL, FREET4, T3FREE, THYROIDAB in the last 72 hours. Anemia Panel: No results for input(s): VITAMINB12, FOLATE, FERRITIN, TIBC, IRON, RETICCTPCT in the last 72 hours. Urine analysis:    Component Value Date/Time   COLORURINE YELLOW 07/14/2018 0410   APPEARANCEUR CLEAR 07/14/2018 0410   APPEARANCEUR Hazy 11/19/2012 1750   LABSPEC 1.018 07/14/2018 0410   LABSPEC 1.014 11/19/2012 1750   PHURINE 5.0 07/14/2018 0410   GLUCOSEU NEGATIVE 07/14/2018 0410   GLUCOSEU Negative 11/19/2012 1750   HGBUR NEGATIVE 07/14/2018 0410   BILIRUBINUR NEGATIVE 07/14/2018 0410   BILIRUBINUR negative 05/12/2015 1823   BILIRUBINUR Negative 11/19/2012 1750   KETONESUR 5 (A) 07/14/2018 0410   PROTEINUR NEGATIVE 07/14/2018 0410   UROBILINOGEN 0.2 05/13/2015 0504   NITRITE NEGATIVE 07/14/2018 0410   LEUKOCYTESUR SMALL (A) 07/14/2018 0410   LEUKOCYTESUR 2+ 11/19/2012 1750   Sepsis Labs: '@LABRCNTIP' (procalcitonin:4,lacticidven:4) )No results found for this or any previous visit (from the past 240 hour(s)).   Radiological Exams on Admission: Dg Chest 1 View  Result Date: 08/14/2018 CLINICAL DATA:  Redness and blisters at the right ankle swelling of both legs EXAM: CHEST  1 VIEW COMPARISON:  05/14/2018 FINDINGS: Post sternotomy changes. Mild cardiomegaly. No acute consolidation or effusion. Aortic atherosclerosis. No pneumothorax. Surgical plate and fixating  screws in the proximal right humerus. Old right-sided rib fractures. IMPRESSION: No active disease.  Cardiomegaly Electronically Signed   By: Donavan Foil M.D.   On: 08/14/2018 20:55   Dg Foot Complete Right  Result Date: 08/14/2018 CLINICAL DATA:  Foot swelling and redness EXAM: RIGHT FOOT COMPLETE - 3+  VIEW COMPARISON:  None. FINDINGS: No fracture or malalignment. No periostitis or bone destruction. Vascular calcifications. No soft tissue emphysema. Moderate arthritis at the first MTP joint. Degenerative narrowing at the DIP and PIP joints. IMPRESSION: No acute osseous abnormality. Electronically Signed   By: Donavan Foil M.D.   On: 08/14/2018 20:57    EKG: Independently reviewed.  Normal sinus rhythm LVH IVCD.  Assessment/Plan Principal Problem:   Cellulitis of right foot Active Problems:   HYPERTENSION, BENIGN   SYSTOLIC HEART FAILURE, CHRONIC   PAD (peripheral artery disease) (HCC)   Chronic kidney disease (CKD), stage III (moderate) (H[facility])   Idiopathic thrombocytopenic purpura (H[facility])   Chronic diastolic CHF (congestive heart failure) (HCC)   Cellulitis    1. Cellulitis of right lower extremity -patient has been placed on empiric antibiotics.  Given that increased swelling recently Dopplers have been ordered to rule out DVT. 2. Right facial ecchymotic area seen.  Will get a CT of the head and maxillofacial.  Patient does have history of chronic subdural hematoma. 3. Multiple wounds on the extremities for which I have consulted wound team. 4. Chronic CHF last EF measured was 60 to 65% in 2018.  On Lasix and spironolactone. 5. Chronically disease stage III creatinine appears to be at baseline. 6. History of CAD has not complained of any chest pain.  On Coreg and Lipitor. 7. Chronic thrombocytopenia with history of non-Hodgkin's lymphoma on Promacta. 8. Anemia follow CBC. 9. Diabetes mellitus type 2 on sliding scale coverage and diet.   DVT prophylaxis: Lovenox. Code Status: Full  code. Family Communication: No family at the bedside. Disposition Plan: Back to nursing facility. Consults called: Wound team. Admission status: Inpatient.   Rise Patience MD [facility] Pager [phone].  If 7PM-7AM, please contact night-coverage [email or web address] Password [facility]  08/15/2018, 3:57 AM

## 2018-08-15 NOTE — Progress Notes (Signed)
Went to start IV antibiotic- patient had pulled IV out and monitor off.  Placed IV consult.  Awaiting IV placement to start IV antibiotic.

## 2018-08-15 NOTE — Progress Notes (Signed)
Patient is more agitated- Called MD to see if we could have something for agitation.  Per Dr. Lonny Prude - he does not want to add on antipsychotic medication to a confused/dementia elderly patient.  If continues to be agitated per Dr.- may have to order a sitter

## 2018-08-15 NOTE — Progress Notes (Signed)
Pharmacy Antibiotic Note  Lisa James is a 85 y.o. female admitted on 08/14/2018 with cellulitis.  Pharmacy has been consulted for vancomycin and zosyn dosing. Zosyn given in the ED earlier.  Plan: Vancomycin 1gm IV x 1 then 500 mg IV q36 hours Continue zosyn 3.375 gm IV q8 hours F/u renal function, cultures and clinical course  Height: 5' (152.4 cm) Weight: 98 lb (44.5 kg) IBW/kg (Calculated) : 45.5  Temp (24hrs), Avg:98.8 F (37.1 C), Min:98.7 F (37.1 C), Max:98.9 F (37.2 C)  Recent Labs  Lab 08/14/18 2100  WBC 7.4  CREATININE 1.38*    Estimated Creatinine Clearance: 19.8 mL/min (A) (by C-G formula based on SCr of 1.38 mg/dL (H)).    Allergies  Allergen Reactions  . Ace Inhibitors Cough    Thank you for allowing pharmacy to be a part of this patient's care.  Excell Seltzer [place]et 08/15/2018 4:09 AM

## 2018-08-16 DIAGNOSIS — I10 Essential (primary) hypertension: Secondary | ICD-10-CM

## 2018-08-16 DIAGNOSIS — N183 Chronic kidney disease, stage 3 (moderate): Secondary | ICD-10-CM

## 2018-08-16 LAB — GLUCOSE, CAPILLARY
GLUCOSE-CAPILLARY: 72 mg/dL (ref 70–99)
GLUCOSE-CAPILLARY: 71 mg/dL (ref 70–99)
Glucose-Capillary: 77 mg/dL (ref 70–99)

## 2018-08-16 LAB — COMPREHENSIVE METABOLIC PANEL
ALT: 12 U/L (ref 0–44)
AST: 19 U/L (ref 15–41)
Albumin: 2.7 g/dL — ABNORMAL LOW (ref 3.5–5.0)
Alkaline Phosphatase: 50 U/L (ref 38–126)
Anion gap: 11 (ref 5–15)
BILIRUBIN TOTAL: 1.3 mg/dL — AB (ref 0.3–1.2)
BUN: 14 mg/dL (ref 8–23)
CO2: 25 mmol/L (ref 22–32)
Calcium: 7.9 mg/dL — ABNORMAL LOW (ref 8.9–10.3)
Chloride: 97 mmol/L — ABNORMAL LOW (ref 98–111)
Creatinine, Ser: 1.58 mg/dL — ABNORMAL HIGH (ref 0.44–1.00)
GFR calc Af Amer: 34 mL/min — ABNORMAL LOW (ref >60)
GFR calc non Af Amer: 29 mL/min — ABNORMAL LOW (ref >60)
Glucose, Bld: 80 mg/dL (ref 70–99)
Potassium: 3.3 mmol/L — ABNORMAL LOW (ref 3.5–5.1)
Sodium: 133 mmol/L — ABNORMAL LOW (ref 135–145)
Total Protein: 4.7 g/dL — ABNORMAL LOW (ref 6.5–8.1)

## 2018-08-16 MED ORDER — PIPERACILLIN-TAZOBACTAM IN DEX 2-0.25 GM/50ML IV SOLN
2.2500 g | Freq: Three times a day (TID) | INTRAVENOUS | Status: DC
  Filled 2018-08-16 (×2): qty 50

## 2018-08-16 MED ORDER — SODIUM CHLORIDE 0.9 % IV BOLUS: 500.0000 mL | Freq: Once | INTRAVENOUS | Status: DC

## 2018-08-16 MED ORDER — DOXYCYCLINE HYCLATE 100 MG PO TABS: 100.0000 mg | ORAL_TABLET | Freq: Two times a day (BID) | ORAL | Status: DC

## 2018-08-16 MED ORDER — CEPHALEXIN 250 MG PO CAPS
250.0000 mg | ORAL_CAPSULE | Freq: Two times a day (BID) | ORAL | Status: DC
  Administered 2018-08-16 – 2018-08-17 (×3): 250 mg via ORAL
  Filled 2018-08-16 (×3): qty 1

## 2018-08-16 NOTE — Progress Notes (Signed)
PROGRESS NOTE    Lisa James  KDT:[id number] DOB: [birth year] DOA: 08/14/2018 PCP: Merrilee Seashore, MD   Brief Narrative: Lisa James is a 85 y.o. female with history of CHF, diabetes mellitus type 2 on diet, chronic thrombocytopenia and history of non-Hodgkin's lymphoma, dementia, chronic kidney disease stage III. Patient presented secondary to cellulitis. She is improving with antibiotics.   Assessment & Plan:   Principal Problem:   Cellulitis of right foot Active Problems:   HYPERTENSION, BENIGN   PAD (peripheral artery disease) (HCC)   Chronic kidney disease (CKD), stage III (moderate) (HCC)   Idiopathic thrombocytopenic purpura (HCC)   Chronic diastolic CHF (congestive heart failure) (HCC)   Cellulitis   Cellulitis Right foot/leg. Improved with Vancomycin/Zosyn. Afebrile. Normal WBC. -Switch to Keflex  Essential hypertension -Continue carvedilol  Chronic diastolic heart failure Last EF normal with diastolic dysfunction. Started recently on Lasix for leg edema. -Discontinue lasix for now since developed AKI  AKI on CKD stage III Baseline of 1.2. Up to 1.58 peak in setting of Lasix -Discontinue Lasix and spironolactone  CAD -Continue Coreg and Lipitor  Chronic thrombocytopenia History of non-hodgkin's lymphoma Stable.  Diabetes mellitus, type 2 Hemoglobin A1C of 6.7% in 2017 -Continue SSI  Pressure injury Left/right buttock, right hip and medial sacrum, present on arrival   DVT prophylaxis: Lovenox Code Status:   Code Status: Full Code Family Communication: Niece on telephone Disposition Plan: Discharge to facility in 1-2 days pending PT eval   Consultants:   None  Procedures:   None  Antimicrobials:  Vancomycin  Zosyn  Keflex    Subjective: Pain improved.  Objective: Vitals:   08/15/18 1723 08/16/18 0008 08/16/18 0545 08/16/18 1212  BP: 132/63 123/64 (!) 129/53 128/66  Pulse: 68 67 61 65  Resp: 17 18 18 16   Temp:  98.4 F (36.9 C) 99.1 F (37.3 C) 98 F (36.7 C) 98.8 F (37.1 C)  TempSrc: Oral Oral Oral Oral  SpO2:  100% 100% 100%  Weight:   44 kg   Height:        Intake/Output Summary (Last 24 hours) at 08/16/2018 1429 Last data filed at 08/16/2018 1100 Gross per 24 hour  Intake 770 ml  Output 650 ml  Net 120 ml   Filed Weights   08/15/18 0041 08/16/18 0545  Weight: 44.5 kg 44 kg    Examination:  General exam: Appears calm and comfortable Cardiovascular system: S1 & S2 heard, RRR. No murmurs, rubs, gallops or clicks. Gastrointestinal system: Abdomen is nondistended, soft and nontender. No organomegaly or masses felt. Normal bowel sounds heard. Central nervous system: Alert and oriented to person. No focal neurological deficits. Extremities: No edema. No calf tenderness Skin: No cyanosis. Erythema resolved    Data Reviewed: I have personally reviewed following labs and imaging studies  CBC: Recent Labs  Lab 08/14/18 2100 08/15/18 0426  WBC 7.4 6.4  NEUTROABS 4.9 4.1  HGB 11.1* 10.7*  HCT 33.3* 29.2*  MCV 104.7* 99.0  PLT 130* 97*   Basic Metabolic Panel: Recent Labs  Lab 08/14/18 2100 08/15/18 0426 08/16/18 0917  NA 135 134* 133*  K 3.0* 3.2* 3.3*  CL 97* 101 97*  CO2 24 23 25   GLUCOSE 87 75 80  BUN 14 13 14   CREATININE 1.38* 1.35* 1.58*  CALCIUM 8.6* 8.1* 7.9*   GFR: Estimated Creatinine Clearance: 17.1 mL/min (A) (by C-G formula based on SCr of 1.58 mg/dL (H)). Liver Function Tests: Recent Labs  Lab 08/14/18 2100 08/15/18 0426  08/16/18 0917  AST 22 21 19   ALT 14 15 12   ALKPHOS 65 54 50  BILITOT 1.4* 1.8* 1.3*  PROT 5.4* 4.6* 4.7*  ALBUMIN 3.3* 2.8* 2.7*   No results for input(s): LIPASE, AMYLASE in the last 168 hours. No results for input(s): AMMONIA in the last 168 hours. Coagulation Profile: No results for input(s): INR, PROTIME in the last 168 hours. Cardiac Enzymes: No results for input(s): CKTOTAL, CKMB, CKMBINDEX, TROPONINI in the last  168 hours. BNP (last 3 results) No results for input(s): PROBNP in the last 8760 hours. HbA1C: No results for input(s): HGBA1C in the last 72 hours. CBG: Recent Labs  Lab 08/15/18 1230 08/15/18 1719 08/15/18 2235 08/16/18 0804 08/16/18 1208  GLUCAP 97 92 81 77 72   Lipid Profile: No results for input(s): CHOL, HDL, LDLCALC, TRIG, CHOLHDL, LDLDIRECT in the last 72 hours. Thyroid Function Tests: No results for input(s): TSH, T4TOTAL, FREET4, T3FREE, THYROIDAB in the last 72 hours. Anemia Panel: No results for input(s): VITAMINB12, FOLATE, FERRITIN, TIBC, IRON, RETICCTPCT in the last 72 hours. Sepsis Labs: No results for input(s): PROCALCITON, LATICACIDVEN in the last 168 hours.  Recent Results (from the past 240 hour(s))  Blood culture (routine x 2)     Status: None (Preliminary result)   Collection Time: 08/14/18  9:01 PM  Result Value Ref Range Status   Specimen Description SITE NOT SPECIFIED  Final   Special Requests   Final    BOTTLES DRAWN AEROBIC AND ANAEROBIC Blood Culture adequate volume   Culture   Final    NO GROWTH 2 DAYS Performed at [facility], [address] [place], [place] [address]    Report Status PENDING  Incomplete  Blood culture (routine x 2)     Status: None (Preliminary result)   Collection Time: 08/14/18  9:02 PM  Result Value Ref Range Status   Specimen Description BLOOD LEFT ANTECUBITAL  Final   Special Requests   Final    BOTTLES DRAWN AEROBIC AND ANAEROBIC Blood Culture adequate volume   Culture   Final    NO GROWTH 2 DAYS Performed at [facility], [place] [address] [place], [place] [address]    Report Status PENDING  Incomplete  MRSA PCR Screening     Status: Abnormal   Collection Time: 08/15/18  1:15 AM  Result Value Ref Range Status   MRSA by PCR POSITIVE (A) NEGATIVE Final    Comment:        The GeneXpert MRSA Assay (FDA approved for NASAL specimens only), is one component of a comprehensive MRSA  colonization surveillance program. It is not intended to diagnose MRSA infection nor to guide or monitor treatment for MRSA infections. RESULT CALLED TO, READ BACK BY AND VERIFIED WITHGary Fleet RN [id number] 08/15/18 A BROWNING Performed at [facility], [place] [address], [place], [place] [address]          Radiology Studies: Dg Chest 1 View  Result Date: 08/14/2018 CLINICAL DATA:  Redness and blisters at the right ankle swelling of both legs EXAM: CHEST  1 VIEW COMPARISON:  05/14/2018 FINDINGS: Post sternotomy changes. Mild cardiomegaly. No acute consolidation or effusion. Aortic atherosclerosis. No pneumothorax. Surgical plate and fixating screws in the proximal right humerus. Old right-sided rib fractures. IMPRESSION: No active disease.  Cardiomegaly Electronically Signed   By: Donavan Foil M.D.   On: 08/14/2018 20:55   Ct Head Wo Contrast  Result Date: 08/15/2018 CLINICAL DATA:  Multiple  falls, bilateral leg swelling. EXAM: CT HEAD WITHOUT CONTRAST CT MAXILLOFACIAL WITHOUT CONTRAST TECHNIQUE: Multidetector CT imaging of the head and maxillofacial structures were performed using the standard protocol without intravenous contrast. Multiplanar CT image reconstructions of the maxillofacial structures were also generated. COMPARISON:  Head CT dated 07/12/2018. 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 Mild chronic small vessel ischemic changes again noted within the periventricular and subcortical white matter regions bilaterally. There is no mass, hemorrhage, edema or other evidence of acute parenchymal abnormality. No acute appearing extra-axial hemorrhage. Vascular: Chronic calcified atherosclerotic changes of the large vessels at the skull base. No unexpected  hyperdense vessel. Skull: Normal. Negative for fracture or focal lesion. Other: None. CT MAXILLOFACIAL FINDINGS Osseous: Lower frontal bones are intact and normally aligned. Osseous structures about the orbits are intact and normally aligned bilaterally. Bilateral zygomatic arches and pterygoid plates are intact. Walls of the maxillary sinuses appear intact and normally aligned bilaterally. No displaced nasal bone fracture. No fracture or dislocation of the mandible. Orbits: Negative. No traumatic or inflammatory finding. Sinuses: Clear. Soft tissues: Unremarkable. No soft tissue hematoma appreciated. IMPRESSION: 1. No acute intracranial abnormality. No intracranial mass, hemorrhage or edema. 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 2. No facial bone fracture or dislocation. 3. Bilateral carotid atherosclerosis. Electronically Signed   By: Franki Cabot M.D.   On: 08/15/2018 09:27   Dg Foot Complete Right  Result Date: 08/14/2018 CLINICAL DATA:  Foot swelling and redness EXAM: RIGHT FOOT COMPLETE - 3+ VIEW COMPARISON:  None. FINDINGS: No fracture or malalignment. No periostitis or bone destruction. Vascular calcifications. No soft tissue emphysema. Moderate arthritis at the first MTP joint. Degenerative narrowing at the DIP and PIP joints. IMPRESSION: No acute osseous abnormality. Electronically Signed   By: Donavan Foil M.D.   On: 08/14/2018 20:57   Vas [place] Lower Extremity Venous (dvt)  Result Date: 08/15/2018  Lower Venous Study Indications: Swelling.  Performing Technologist: June Leap RDMS, RVT  Examination Guidelines: A complete evaluation includes B-mode imaging, spectral Doppler, color Doppler, and power Doppler as needed of all accessible portions of each vessel. Bilateral testing is considered an integral part of a complete  examination. Limited examinations for reoccurring indications may be performed as noted.  Right Venous Findings: +---------+---------------+---------+-----------+----------+-------+          CompressibilityPhasicitySpontaneityPropertiesSummary +---------+---------------+---------+-----------+----------+-------+ CFV      Full           Yes      Yes                          +---------+---------------+---------+-----------+----------+-------+ SFJ      Full                                                 +---------+---------------+---------+-----------+----------+-------+ FV Prox  Full                                                 +---------+---------------+---------+-----------+----------+-------+  FV Mid   Full                                                 +---------+---------------+---------+-----------+----------+-------+ FV DistalFull                                                 +---------+---------------+---------+-----------+----------+-------+ PFV      Full                                                 +---------+---------------+---------+-----------+----------+-------+ POP      Full           Yes      Yes                          +---------+---------------+---------+-----------+----------+-------+ PTV      Full                                                 +---------+---------------+---------+-----------+----------+-------+ PERO     Full                                                 +---------+---------------+---------+-----------+----------+-------+  Left Venous Findings: +---------+---------------+---------+-----------+----------+-------+          CompressibilityPhasicitySpontaneityPropertiesSummary +---------+---------------+---------+-----------+----------+-------+ CFV      Full           Yes      Yes                          +---------+---------------+---------+-----------+----------+-------+ SFJ      Full                                                  +---------+---------------+---------+-----------+----------+-------+ FV Prox  Full                                                 +---------+---------------+---------+-----------+----------+-------+ FV Mid   Full                                                 +---------+---------------+---------+-----------+----------+-------+ FV DistalFull                                                 +---------+---------------+---------+-----------+----------+-------+  PFV      Full                                                 +---------+---------------+---------+-----------+----------+-------+ POP      Full           Yes      Yes                          +---------+---------------+---------+-----------+----------+-------+ PTV      Full                                                 +---------+---------------+---------+-----------+----------+-------+ PERO     Full                                                 +---------+---------------+---------+-----------+----------+-------+    Summary: Right: There is no evidence of deep vein thrombosis in the lower extremity. No cystic structure found in the popliteal fossa. Left: There is no evidence of deep vein thrombosis in the lower extremity. No cystic structure found in the popliteal fossa.  *See table(s) above for measurements and observations. Electronically signed by Servando Snare MD on 08/15/2018 at 1:27:46 PM.    Final    Ct Maxillofacial Wo Contrast  Result Date: 08/15/2018 CLINICAL DATA:  Multiple falls, bilateral leg swelling. EXAM: CT HEAD WITHOUT CONTRAST CT MAXILLOFACIAL WITHOUT CONTRAST TECHNIQUE: Multidetector CT imaging of the head and maxillofacial structures were performed using the standard protocol without intravenous contrast. Multiplanar CT image reconstructions of the maxillofacial structures were also generated. COMPARISON:  Head CT dated 07/12/2018.  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 Mild chronic small vessel ischemic changes again noted within the periventricular and subcortical white matter regions bilaterally. There is no mass, hemorrhage, edema or other evidence of acute parenchymal abnormality. No acute appearing extra-axial hemorrhage. Vascular: Chronic calcified atherosclerotic changes of the large vessels at the skull base. No unexpected hyperdense vessel. Skull: Normal. Negative for fracture or focal lesion. Other: None. CT MAXILLOFACIAL FINDINGS Osseous: Lower frontal bones are intact and normally aligned. Osseous structures about the orbits are intact and normally aligned bilaterally. Bilateral zygomatic arches and pterygoid plates are intact. Walls of the maxillary sinuses appear intact and normally aligned bilaterally. No displaced nasal bone fracture. No fracture or dislocation of the mandible. Orbits: Negative. No traumatic or inflammatory finding. Sinuses: Clear. Soft tissues: Unremarkable. No soft tissue hematoma appreciated. IMPRESSION: 1. No acute intracranial abnormality. No intracranial mass, hemorrhage or edema. 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 2. No facial bone fracture or dislocation. 3. Bilateral carotid atherosclerosis. Electronically Signed   By: Franki Cabot M.D.   On: 08/15/2018 09:27        Scheduled Meds: . atorvastatin  20 mg Oral QHS  . carvedilol  6.25 mg Oral BID WC  . Chlorhexidine Gluconate Cloth  6 each Topical Q0600  . doxycycline  100 mg Oral Q12H  . eltrombopag  75 mg Oral Daily  . enoxaparin  (LOVENOX) injection  30 mg Subcutaneous Q24H  . insulin aspart  0-9 Units Subcutaneous TID WC  . megestrol  400 mg Oral BID  . mupirocin ointment  1 application Nasal BID  . sertraline  25 mg Oral Daily  . spironolactone  25 mg Oral Daily  . traZODone  50 mg Oral QHS   Continuous Infusions: . sodium chloride 250 mL (08/15/18 1736)  . sodium chloride       LOS: 2 days     Cordelia Poche, MD [facility] 08/16/2018, 2:29 PM  If 7PM-7AM, please contact night-coverage [email or web address]

## 2018-08-16 NOTE — Progress Notes (Signed)
Pharmacy Antibiotic Note  Lisa James is a 85 y.o. female admitted on 08/14/2018 with cellulitis.  Pharmacy has been consulted for vancomycin and zosyn dosing. Zosyn given in the ED earlier.  Patient remains afebrile and WBC is within normal limits. Due to patient's older age and renal dysfunction (CrCl ~20 mL/min), will reduce zosyn dose.  Plan: Decrease Zosyn to 2.25 gm IV q8 hours Continue Vancomycin 500 mg IV q36 hours F/u renal function, cultures and clinical course  Height: 5' (152.4 cm) Weight: 97 lb (44 kg) IBW/kg (Calculated) : 45.5  Temp (24hrs), Avg:98.3 F (36.8 C), Min:97.5 F (36.4 C), Max:99.1 F (37.3 C)  Recent Labs  Lab 08/14/18 2100 08/15/18 0426  WBC 7.4 6.4  CREATININE 1.38* 1.35*    Estimated Creatinine Clearance: 20 mL/min (A) (by C-G formula based on SCr of 1.35 mg/dL (H)).    Allergies  Allergen Reactions  . Ace Inhibitors Cough   Antimicrobials this admission:  Vanc 1/25 >>  Zosyn 1/25 >>   Dose adjustments this admission:  Zosyn 3.375 gm q8h >> 2.25 gm q8h  Microbiology results:  1/24 BCx: NGTD 1/25 MRSA PCR: positive   Thank you for allowing pharmacy to be a part of this patient's care.  Jackson Latino, PharmD PGY1 Pharmacy Resident Phone [phone] 08/16/2018     9:07 AM

## 2018-08-17 ENCOUNTER — Inpatient Hospital Stay (HOSPITAL_COMMUNITY): Payer: Medicare Other

## 2018-08-17 LAB — GLUCOSE, CAPILLARY
GLUCOSE-CAPILLARY: 92 mg/dL (ref 70–99)
Glucose-Capillary: 89 mg/dL (ref 70–99)
Glucose-Capillary: 86 mg/dL (ref 70–99)

## 2018-08-17 MED ORDER — MUPIROCIN 2 % EX OINT: 1.0000 "application " | TOPICAL_OINTMENT | Freq: Two times a day (BID) | CUTANEOUS | 0 refills | Status: AC

## 2018-08-17 MED ORDER — HYDROCODONE-ACETAMINOPHEN 5-325 MG PO TABS: 1.0000 | ORAL_TABLET | Freq: Four times a day (QID) | ORAL | 0 refills | Status: AC | PRN

## 2018-08-17 MED ORDER — CEPHALEXIN 250 MG PO CAPS: 250.0000 mg | ORAL_CAPSULE | Freq: Two times a day (BID) | ORAL | 0 refills | Status: AC

## 2018-08-17 MED ORDER — COLLAGENASE 250 UNIT/GM EX OINT: TOPICAL_OINTMENT | Freq: Every day | CUTANEOUS | 0 refills | Status: AC

## 2018-08-17 MED ORDER — COLLAGENASE 250 UNIT/GM EX OINT
TOPICAL_OINTMENT | Freq: Every day | CUTANEOUS | Status: DC
  Filled 2018-08-17: qty 30

## 2018-08-17 MED ORDER — MUPIROCIN CALCIUM 2 % EX CREA
TOPICAL_CREAM | Freq: Every day | CUTANEOUS | Status: DC
  Administered 2018-08-17: 13:00:00 via TOPICAL
  Filled 2018-08-17: qty 15

## 2018-08-17 NOTE — Discharge Instructions (Signed)
Lisa James,  You were in the hospital because of cellulitis. This has been treated with antibiotics. Please continue the regimen.

## 2018-08-17 NOTE — Discharge Summary (Signed)
Physician Discharge Summary  Lisa James UKG:[id number] DOB: 1927-08-24 DOA: 08/14/2018  PCP: Merrilee Seashore, MD  Admit date: 08/14/2018 Discharge date: 08/17/2018  Admitted From: ALF Disposition: ALF  Recommendations for Outpatient Follow-up:  1. Follow up with PCP in 1 week 2. Please obtain BMP/CBC in 2-3 days 3. Please follow up on the following pending results: None  Home Health: PT, OT, RN Equipment/Devices: Wheelchair, rolling walker  Discharge Condition: Stable CODE STATUS: Full code Diet recommendation: Heart healthy  Brief/Interim Summary:  Admission HPI written by Rise Patience, MD   Chief Complaint: Increasing swelling and erythema of the right lower extremity.  HPI: Lisa James is a 85 y.o. female with history of CHF, diabetes mellitus type 2 on diet, chronic thrombocytopenia and history of non-Hodgkin's lymphoma, dementia, chronic kidney disease stage III who was recently admitted last month for right hip fracture and also a right shoulder fracture recently presents to the ER after patient's family found the patient has been having increasing redness and swelling of the right lower extremity.  Patient recently was found to have bilateral lower extremity edema and the patient was placed on Lasix.  Patient eventually started developing blisters and some of them popped and right lower extremity started to be erythema and increasing swelling and was brought to the ER.  ED Course: In the ER on exam patient has a small ulceration on the dorsal aspect of her right foot.  Erythema extends up to the mid calf.  Given the clinical features consistent with cellulitis patient was placed on antibiotics admitted for further management.  Lab work appears to be at baseline.  Patient is demented and does not contribute to history.  From the reports patient may have been recently placed on doxycycline for the cellulitis.   Hospital course:  Cellulitis Right  foot/leg. Improved with Vancomycin/Zosyn. Afebrile. Normal WBC. Switched to Keflex. 7 day total course of antibiotics.  Essential hypertension Continue carvedilol  Chronic diastolic heart failure Last EF normal with diastolic dysfunction. Started recently on Lasix for leg edema. Lasix discontinued secondary to AKI  AKI on CKD stage III Baseline of 1.2. Up to 1.58 peak in setting of Lasix. Discontinued Lasix and spironolactone. Recheck BMP in 2-3 days and restart lasix if improved.  CAD Continue Coreg and Lipitor  Chronic thrombocytopenia History of non-hodgkin's lymphoma Stable.  Diabetes mellitus, type 2 Hemoglobin A1C of 6.7% in 2017.  Pressure injury Left/right buttock, right hip and medial sacrum, present on arrival. Foam dressing to protect and promte healing to wounds.  Santyl to provide enzymatic debridement to left ischium and right buttock.  Bactroban to promote moist healing to right foot.  Discharge Diagnoses:  Principal Problem:   Cellulitis of right foot Active Problems:   HYPERTENSION, BENIGN   PAD (peripheral artery disease) (HCC)   Chronic kidney disease (CKD), stage III (moderate) (HCC)   Idiopathic thrombocytopenic purpura (HCC)   Chronic diastolic CHF (congestive heart failure) ([place])   Cellulitis    Discharge Instructions  Discharge Instructions    Call MD for:  severe uncontrolled pain   Complete by:  As directed    Call MD for:  temperature >100.4   Complete by:  As directed    Diet - low sodium heart healthy   Complete by:  As directed    Increase activity slowly   Complete by:  As directed      Allergies as of 08/17/2018      Reactions   Ace Inhibitors  Cough      Medication List    STOP taking these medications   ciprofloxacin 0.3 % ophthalmic solution Commonly known as:  CILOXAN   docusate sodium 100 MG capsule Commonly known as:  COLACE   doxycycline 100 MG tablet Commonly known as:  VIBRA-TABS   feeding supplement  (ENSURE ENLIVE) Liqd   furosemide 20 MG tablet Commonly known as:  LASIX   spironolactone 25 MG tablet Commonly known as:  ALDACTONE     TAKE these medications   atorvastatin 20 MG tablet Commonly known as:  LIPITOR TAKE 1 TABLET (20 MG TOTAL) BY MOUTH DAILY. What changed:  See the new instructions.   calcium-vitamin D 500-200 MG-UNIT tablet Take 1 tablet by mouth daily.   carvedilol 6.25 MG tablet Commonly known as:  COREG Take 1 tablet (6.25 mg total) by mouth 2 (two) times daily with a meal.   cephALEXin 250 MG capsule Commonly known as:  KEFLEX Take 1 capsule (250 mg total) by mouth every 12 (twelve) hours for 4 days.   collagenase ointment Commonly known as:  SANTYL Apply topically daily. Apply Santyl to left ischium, right buttock Q day, then cover with moist 2X2 and foam dressing.  (Change foam dressing Q 3 days or PRN soiling.) Start taking on:  August 18, 2018   HYDROcodone-acetaminophen 5-325 MG tablet Commonly known as:  NORCO/VICODIN Take 1 tablet by mouth every 6 (six) hours as needed (for pain).   LACTOBACILLUS PO Take 2 capsules by mouth 3 (three) times daily.   megestrol 40 MG/ML suspension Commonly known as:  MEGACE Take 400 mg by mouth 2 (two) times daily.   methocarbamol 500 MG tablet Commonly known as:  ROBAXIN Take 1 tablet (500 mg total) by mouth every 6 (six) hours as needed for muscle spasms.   mupirocin ointment 2 % Commonly known as:  BACTROBAN Place 1 application into the nose 2 (two) times daily for 4 days.   PRESERVISION AREDS PO Take 1 tablet by mouth daily.   PROMACTA 75 MG tablet Generic drug:  eltrombopag TAKE 1 TABLET (75 MG TOTAL) BY MOUTH DAILY. TAKE ON AN EMPTY STOMACH 1 HOUR BEFORE MEALS OR 2 HOURS AFTER. What changed:  See the new instructions.   REFRESH OPTIVE 1-0.9 % Gel Generic drug:  Carboxymethylcellul-Glycerin Place 2 drops into both eyes 3 (three) times daily.   sertraline 25 MG tablet Commonly known as:   ZOLOFT Take 25 mg by mouth daily.   traZODone 50 MG tablet Commonly known as:  DESYREL Take 50 mg by mouth at bedtime.   ZEASORB powder Generic drug:  talc Apply 1 application topically every 12 (twelve) hours as needed (UNDER BREASTS, for irritation).            Durable Medical Equipment  (From admission, onward)         Start     Ordered   08/17/18 1411  For home use only DME lightweight manual wheelchair with seat cushion  Once    Comments:  Patient suffers from weakness, decondition, cellulitis which impairs their ability to perform daily activities like walking in the home.  A walker will not resolve  issue with performing activities of daily living. A wheelchair will allow patient to safely perform daily activities. Patient is not able to propel themselves in the home using a standard weight wheelchair due to weakness. Patient can self propel in the lightweight wheelchair.  Accessories: elevating leg rests (ELRs), wheel locks, extensions and anti-tippers.  08/17/18 1412   08/17/18 1231  For home use only DME 4 wheeled rolling walker with seat  Once    Question:  Patient needs a walker to treat with the following condition  Answer:  Cellulitis   08/17/18 1231         Follow-up Information    Merrilee Seashore, MD On 08/25/2018.   Specialty:  Internal Medicine Why:  @2 :00pm Contact information: [address] [place] [address] [phone]          Allergies  Allergen Reactions  . Ace Inhibitors Cough    Consultations:  None   Procedures/Studies: Dg Chest 1 View  Result Date: 08/14/2018 CLINICAL DATA:  Redness and blisters at the right ankle swelling of both legs EXAM: CHEST  1 VIEW COMPARISON:  05/14/2018 FINDINGS: Post sternotomy changes. Mild cardiomegaly. No acute consolidation or effusion. Aortic atherosclerosis. No pneumothorax. Surgical plate and fixating screws in the proximal right humerus. Old right-sided rib fractures.  IMPRESSION: No active disease.  Cardiomegaly Electronically Signed   By: Donavan Foil M.D.   On: 08/14/2018 20:55   Dg Tibia/fibula Right  Result Date: 08/17/2018 CLINICAL DATA:  Cellulitis of the RIGHT lower extremity EXAM: RIGHT TIBIA AND FIBULA - 2 VIEW COMPARISON:  None FINDINGS: Marked osseous demineralization. Joint space narrowing RIGHT knee. Ankle joint space preserved. No fracture, dislocation or bone destruction. Patellar spur at quadriceps tendon insertion. Extensive atherosclerotic calcifications. IMPRESSION: No acute osseous abnormalities. Electronically Signed   By: Lavonia Dana M.D.   On: 08/17/2018 13:42   Ct Head Wo Contrast  Result Date: 08/15/2018 CLINICAL DATA:  Multiple falls, bilateral leg swelling. EXAM: CT HEAD WITHOUT CONTRAST CT MAXILLOFACIAL WITHOUT CONTRAST TECHNIQUE: Multidetector CT imaging of the head and maxillofacial structures were performed using the standard protocol without intravenous contrast. Multiplanar CT image reconstructions of the maxillofacial structures were also generated. COMPARISON:  Head CT dated 07/12/2018. 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 Mild chronic small vessel ischemic changes again noted within the periventricular and subcortical white matter regions bilaterally. There is no mass, hemorrhage, edema or other evidence of acute parenchymal abnormality. No acute appearing extra-axial hemorrhage. Vascular: Chronic calcified atherosclerotic changes of the large vessels at the skull base. No unexpected hyperdense vessel. Skull: Normal. Negative for fracture or focal lesion. Other: None. CT MAXILLOFACIAL FINDINGS Osseous: Lower frontal bones are intact and normally aligned. Osseous structures about the orbits are intact and  normally aligned bilaterally. Bilateral zygomatic arches and pterygoid plates are intact. Walls of the maxillary sinuses appear intact and normally aligned bilaterally. No displaced nasal bone fracture. No fracture or dislocation of the mandible. Orbits: Negative. No traumatic or inflammatory finding. Sinuses: Clear. Soft tissues: Unremarkable. No soft tissue hematoma appreciated. IMPRESSION: 1. No acute intracranial abnormality. No intracranial mass, hemorrhage or edema. 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 2. No facial bone fracture or dislocation. 3. Bilateral carotid atherosclerosis. Electronically Signed   By: Franki Cabot M.D.   On: 08/15/2018 09:27   Dg Foot Complete Right  Result Date: 08/14/2018 CLINICAL DATA:  Foot swelling and redness EXAM: RIGHT FOOT COMPLETE - 3+ VIEW COMPARISON:  None. FINDINGS: No fracture or malalignment. No periostitis  or bone destruction. Vascular calcifications. No soft tissue emphysema. Moderate arthritis at the first MTP joint. Degenerative narrowing at the DIP and PIP joints. IMPRESSION: No acute osseous abnormality. Electronically Signed   By: Donavan Foil M.D.   On: 08/14/2018 20:57   Xr Shoulder Right  Result Date: 07/29/2018 2 views right proximal humerus reviewed.  Good position alignment of plate fixation for proximal humerus fracture.  No evidence of collapse or hardware complication.  Shoulder is located.  Vas [place] Lower Extremity Venous (dvt)  Result Date: 08/15/2018  Lower Venous Study Indications: Swelling.  Performing Technologist: June Leap RDMS, RVT  Examination Guidelines: A complete evaluation includes B-mode imaging, spectral Doppler, color Doppler, and power Doppler as needed of all accessible portions of each vessel. Bilateral testing is considered an integral part of a  complete examination. Limited examinations for reoccurring indications may be performed as noted.  Right Venous Findings: +---------+---------------+---------+-----------+----------+-------+          CompressibilityPhasicitySpontaneityPropertiesSummary +---------+---------------+---------+-----------+----------+-------+ CFV      Full           Yes      Yes                          +---------+---------------+---------+-----------+----------+-------+ SFJ      Full                                                 +---------+---------------+---------+-----------+----------+-------+ FV Prox  Full                                                 +---------+---------------+---------+-----------+----------+-------+ FV Mid   Full                                                 +---------+---------------+---------+-----------+----------+-------+ FV DistalFull                                                 +---------+---------------+---------+-----------+----------+-------+ PFV      Full                                                 +---------+---------------+---------+-----------+----------+-------+ POP      Full           Yes      Yes                          +---------+---------------+---------+-----------+----------+-------+ PTV      Full                                                 +---------+---------------+---------+-----------+----------+-------+ PERO     Full                                                 +---------+---------------+---------+-----------+----------+-------+  Left Venous Findings: +---------+---------------+---------+-----------+----------+-------+          CompressibilityPhasicitySpontaneityPropertiesSummary +---------+---------------+---------+-----------+----------+-------+ CFV      Full           Yes      Yes                          +---------+---------------+---------+-----------+----------+-------+ SFJ       Full                                                 +---------+---------------+---------+-----------+----------+-------+ FV Prox  Full                                                 +---------+---------------+---------+-----------+----------+-------+ FV Mid   Full                                                 +---------+---------------+---------+-----------+----------+-------+ FV DistalFull                                                 +---------+---------------+---------+-----------+----------+-------+ PFV      Full                                                 +---------+---------------+---------+-----------+----------+-------+ POP      Full           Yes      Yes                          +---------+---------------+---------+-----------+----------+-------+ PTV      Full                                                 +---------+---------------+---------+-----------+----------+-------+ PERO     Full                                                 +---------+---------------+---------+-----------+----------+-------+    Summary: Right: There is no evidence of deep vein thrombosis in the lower extremity. No cystic structure found in the popliteal fossa. Left: There is no evidence of deep vein thrombosis in the lower extremity. No cystic structure found in the popliteal fossa.  *See table(s) above for measurements and observations. Electronically signed by Servando Snare MD on 08/15/2018 at 1:27:46 PM.    Final    Ct Maxillofacial Wo Contrast  Result Date: 08/15/2018 CLINICAL DATA:  Multiple falls, bilateral leg swelling. EXAM: CT HEAD WITHOUT CONTRAST CT MAXILLOFACIAL WITHOUT CONTRAST TECHNIQUE: Multidetector CT imaging of the  head and maxillofacial structures were performed using the standard protocol without intravenous contrast. Multiplanar CT image reconstructions of the maxillofacial structures were also generated. COMPARISON:  Head CT dated  07/12/2018. 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 Mild chronic small vessel ischemic changes again noted within the periventricular and subcortical white matter regions bilaterally. There is no mass, hemorrhage, edema or other evidence of acute parenchymal abnormality. No acute appearing extra-axial hemorrhage. Vascular: Chronic calcified atherosclerotic changes of the large vessels at the skull base. No unexpected hyperdense vessel. Skull: Normal. Negative for fracture or focal lesion. Other: None. CT MAXILLOFACIAL FINDINGS Osseous: Lower frontal bones are intact and normally aligned. Osseous structures about the orbits are intact and normally aligned bilaterally. Bilateral zygomatic arches and pterygoid plates are intact. Walls of the maxillary sinuses appear intact and normally aligned bilaterally. No displaced nasal bone fracture. No fracture or dislocation of the mandible. Orbits: Negative. No traumatic or inflammatory finding. Sinuses: Clear. Soft tissues: Unremarkable. No soft tissue hematoma appreciated. IMPRESSION: 1. No acute intracranial abnormality. No intracranial mass, hemorrhage or edema. 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 2. No facial bone fracture or dislocation. 3. Bilateral carotid atherosclerosis. Electronically Signed   By: Franki Cabot M.D.   On: 08/15/2018 09:27      Subjective: Some right leg pain.  Discharge Exam: Vitals:   08/17/18 0429 08/17/18 1158  BP: 120/84 135/87  Pulse: 72 63  Resp: 18 18  Temp: 98.5 F (36.9 C) (!) 97.5 F (36.4 C)  SpO2: 100% 99%   Vitals:    08/16/18 1212 08/17/18 0429 08/17/18 0431 08/17/18 1158  BP: 128/66 120/84  135/87  Pulse: 65 72  63  Resp: 16 18  18   Temp: 98.8 F (37.1 C) 98.5 F (36.9 C)  (!) 97.5 F (36.4 C)  TempSrc: Oral Oral  Oral  SpO2: 100% 100%  99%  Weight:   43.8 kg   Height:        General: Pt is alert, awake, not in acute distress Extremities: no edema, no cyanosis, no erythema of right leg    The results of significant diagnostics from this hospitalization (including imaging, microbiology, ancillary and laboratory) are listed below for reference.     Microbiology: Recent Results (from the past 240 hour(s))  Blood culture (routine x 2)     Status: None (Preliminary result)   Collection Time: 08/14/18  9:01 PM  Result Value Ref Range Status   Specimen Description BLOOD SITE NOT SPECIFIED  Final   Special Requests   Final    BOTTLES DRAWN AEROBIC AND ANAEROBIC Blood Culture adequate volume   Culture   Final    NO GROWTH 3 DAYS Performed at [facility], [address] [place], [place] [address]    Report Status PENDING  Incomplete  Blood culture (routine x 2)     Status: None (Preliminary result)   Collection Time: 08/14/18  9:02 PM  Result Value Ref Range Status   Specimen Description BLOOD LEFT ANTECUBITAL  Final   Special Requests   Final    BOTTLES DRAWN AEROBIC AND ANAEROBIC Blood Culture adequate volume   Culture   Final  NO GROWTH 3 DAYS Performed at [facility], [place] [address], [place], [place] [address]    Report Status PENDING  Incomplete  MRSA PCR Screening     Status: Abnormal   Collection Time: 08/15/18  1:15 AM  Result Value Ref Range Status   MRSA by PCR POSITIVE (A) NEGATIVE Final    Comment:        The GeneXpert MRSA Assay (FDA approved for NASAL specimens only), is one component of a comprehensive MRSA colonization surveillance program. It is not intended to diagnose MRSA infection nor to guide or monitor treatment for MRSA  infections. RESULT CALLED TO, READ BACK BY AND VERIFIED WITHGary Fleet RN [id number] 08/15/18 A BROWNING Performed at [facility], [place] [address] [place], [place] [address]      Labs: BNP (last 3 results) Recent Labs    08/14/18 2100  BNP 768.0*   Basic Metabolic Panel: Recent Labs  Lab 08/14/18 2100 08/15/18 0426 08/16/18 0917  NA 135 134* 133*  K 3.0* 3.2* 3.3*  CL 97* 101 97*  CO2 24 23 25   GLUCOSE 87 75 80  BUN 14 13 14   CREATININE 1.38* 1.35* 1.58*  CALCIUM 8.6* 8.1* 7.9*   Liver Function Tests: Recent Labs  Lab 08/14/18 2100 08/15/18 0426 08/16/18 0917  AST 22 21 19   ALT 14 15 12   ALKPHOS 65 54 50  BILITOT 1.4* 1.8* 1.3*  PROT 5.4* 4.6* 4.7*  ALBUMIN 3.3* 2.8* 2.7*   No results for input(s): LIPASE, AMYLASE in the last 168 hours. No results for input(s): AMMONIA in the last 168 hours. CBC: Recent Labs  Lab 08/14/18 2100 08/15/18 0426  WBC 7.4 6.4  NEUTROABS 4.9 4.1  HGB 11.1* 10.7*  HCT 33.3* 29.2*  MCV 104.7* 99.0  PLT 130* 97*   Cardiac Enzymes: No results for input(s): CKTOTAL, CKMB, CKMBINDEX, TROPONINI in the last 168 hours. BNP: Invalid input(s): POCBNP CBG: Recent Labs  Lab 08/16/18 1208 08/16/18 1704 08/16/18 2123 08/17/18 0739 08/17/18 1208  GLUCAP 72 89 71 86 92   D-Dimer No results for input(s): DDIMER in the last 72 hours. Hgb A1c No results for input(s): HGBA1C in the last 72 hours. Lipid Profile No results for input(s): CHOL, HDL, LDLCALC, TRIG, CHOLHDL, LDLDIRECT in the last 72 hours. Thyroid function studies No results for input(s): TSH, T4TOTAL, T3FREE, THYROIDAB in the last 72 hours.  Invalid input(s): FREET3 Anemia work up No results for input(s): VITAMINB12, FOLATE, FERRITIN, TIBC, IRON, RETICCTPCT in the last 72 hours. Urinalysis    Component Value Date/Time   COLORURINE YELLOW 07/14/2018 0410   APPEARANCEUR CLEAR 07/14/2018 0410   APPEARANCEUR Hazy 11/19/2012 1750   LABSPEC 1.018 07/14/2018 0410    LABSPEC 1.014 11/19/2012 1750   PHURINE 5.0 07/14/2018 0410   GLUCOSEU NEGATIVE 07/14/2018 0410   GLUCOSEU Negative 11/19/2012 1750   HGBUR NEGATIVE 07/14/2018 0410   BILIRUBINUR NEGATIVE 07/14/2018 0410   BILIRUBINUR negative 05/12/2015 1823   BILIRUBINUR Negative 11/19/2012 1750   KETONESUR 5 (A) 07/14/2018 0410   PROTEINUR NEGATIVE 07/14/2018 0410   UROBILINOGEN 0.2 05/13/2015 0504   NITRITE NEGATIVE 07/14/2018 0410   LEUKOCYTESUR SMALL (A) 07/14/2018 0410   LEUKOCYTESUR 2+ 11/19/2012 1750   Sepsis Labs Invalid input(s): PROCALCITONIN,  WBC,  LACTICIDVEN Microbiology Recent Results (from the past 240 hour(s))  Blood culture (routine x 2)     Status: None (Preliminary result)   Collection Time: 08/14/18  9:01 PM  Result Value Ref Range Status  Specimen Description BLOOD SITE NOT SPECIFIED  Final   Special Requests   Final    BOTTLES DRAWN AEROBIC AND ANAEROBIC Blood Culture adequate volume   Culture   Final    NO GROWTH 3 DAYS Performed at [facility], [address], [place], [place] [address]    Report Status PENDING  Incomplete  Blood culture (routine x 2)     Status: None (Preliminary result)   Collection Time: 08/14/18  9:02 PM  Result Value Ref Range Status   Specimen Description BLOOD LEFT ANTECUBITAL  Final   Special Requests   Final    BOTTLES DRAWN AEROBIC AND ANAEROBIC Blood Culture adequate volume   Culture   Final    NO GROWTH 3 DAYS Performed at [facility], [place] [address] [place], [place] [address]    Report Status PENDING  Incomplete  MRSA PCR Screening     Status: Abnormal   Collection Time: 08/15/18  1:15 AM  Result Value Ref Range Status   MRSA by PCR POSITIVE (A) NEGATIVE Final    Comment:        The GeneXpert MRSA Assay (FDA approved for NASAL specimens only), is one component of a comprehensive MRSA colonization surveillance program. It is not intended to diagnose MRSA infection nor to guide or monitor treatment for MRSA  infections. RESULT CALLED TO, READ BACK BY AND VERIFIED WITHGary Fleet RN [id number] 08/15/18 A BROWNING Performed at [facility], [place] [address], [place], [place] [address]      SIGNED:  Cordelia Poche, MD [facility] 08/17/2018, 2:41 PM

## 2018-08-17 NOTE — Clinical Social Work Note (Addendum)
Notified [facility] of plan for discharge today. Faxed FL2 and discharge summary. They will call once reviewed to determine if any changes need to be made or if she can go ahead and return. Niece is hoping to transport her around 4:00. Will notify her once everything is complete.  Dayton Scrape, [place] [phone]  3:46 pm Spoke to staff member at [facility]. They confirmed they received and reviewed the discharge paperwork. Staff member will find out if any changes needed to be made and call CSW back.  Dayton Scrape, [place] [phone]  4:12 pm Documentation has been approved by [facility]. Paged MD to cosign FL2 and sign Vicodin prescription. [facility] is also bringing her wheelchair shortly per [facility].  Dayton Scrape, [place]

## 2018-08-17 NOTE — Progress Notes (Addendum)
Medical Necessity note for Wheelchair  Patient suffers from weakness, decondition, cellulitis which impairs their ability to perform daily activities like walking in the home. A walker will not resolve  issue with performing activities of daily living. A wheelchair will allow patient to safely perform daily activities. Patient is not able to propel themselves in the home using a standard weight wheelchair due to weakness. Patient can self propel in the lightweight wheelchair.  Accessories: elevating leg rests (ELRs), wheel locks, extensions and anti-tippers.     Durable Medical Equipment  (From admission, onward)         Start     Ordered   08/17/18 1459  For home use only DME lightweight manual wheelchair with seat cushion  Once    Comments:  Patient suffers from dementia, cellulitis, debility which impairs their ability to perform daily activities like bathing, dressing, feeding, grooming and toileting in the home.  A cane, crutch or walker will not resolve  issue with performing activities of daily living. A wheelchair will allow patient to safely perform daily activities. Patient is not able to propel themselves in the home using a standard weight wheelchair due to arm weakness, endurance and general weakness. Patient can self propel in the lightweight wheelchair.  Accessories: elevating leg rests (ELRs), wheel locks, extensions and anti-tippers.   08/17/18 1459   08/17/18 1411  For home use only DME lightweight manual wheelchair with seat cushion  Once    Comments:  Patient suffers from weakness, decondition, cellulitis which impairs their ability to perform daily activities like walking in the home.  A walker will not resolve  issue with performing activities of daily living. A wheelchair will allow patient to safely perform daily activities. Patient is not able to propel themselves in the home using a standard weight wheelchair due to weakness. Patient can self propel in the lightweight  wheelchair.  Accessories: elevating leg rests (ELRs), wheel locks, extensions and anti-tippers.   08/17/18 1412   08/17/18 1231  For home use only DME 4 wheeled rolling walker with seat  Once    Question:  Patient needs a walker to treat with the following condition  Answer:  Cellulitis   08/17/18 1231

## 2018-08-17 NOTE — Progress Notes (Signed)
Rollater ( rolling walker with seat ) ordered as requested; Aneta Mins [phone]

## 2018-08-17 NOTE — NC FL2 (Signed)
[place] LEVEL OF CARE SCREENING TOOL     IDENTIFICATION  Patient Name: Lisa James Birthdate: 08/24/1927 Sex: female Admission Date (Current Location): 08/14/2018  [facility] [place] and [place] Number:  Herbalist and Address:  The . [facility], [place] [address] [place], [place] [address]      Provider Number: [id number]  Attending Physician Name and Address:  Mariel Aloe, MD  Relative Name and Phone Number:       Current Level of Care: Hospital Recommended Level of Care: [place], Memory Care(with PT and RN) Prior Approval Number:    Date Approved/Denied:   PASRR Number:    Discharge Plan: Other (Comment)(Memory Care ALF with PT and RN)    Current Diagnoses: Patient Active Problem List   Diagnosis Date Noted  . Cellulitis 08/15/2018  . Cellulitis of right foot 08/14/2018  . Protein-calorie malnutrition, severe 07/13/2018  . Closed right hip fracture, initial encounter ([place]) 07/12/2018  . Acute on chronic anemia 06/05/2018  . Chronic diastolic CHF (congestive heart failure) ([place]) 06/05/2018  . Closed fracture of right proximal humerus 05/30/2018  . Proximal humerus fracture 05/28/2018  . Pressure injury of skin 05/15/2018  . Fall 05/14/2018  . Closed comminuted right humeral fracture 05/14/2018  . UTI (urinary tract infection) 05/14/2018  . Hyponatremia 05/14/2018  . Leucocytosis 05/14/2018  . Altered mental status 05/13/2015  . Idiopathic thrombocytopenic purpura ([place]) 07/06/2013  . Aortic valve stenosis 06/19/2013  . Immune thrombocytopenic purpura ([place]) 01/21/2013  . GERD (gastroesophageal reflux disease) 12/17/2012  . Type II or unspecified type diabetes mellitus with renal manifestations, not stated as uncontrolled(250.40) 12/10/2012  . Chronic kidney disease (CKD), stage III (moderate) ([place]) 11/22/2012  . Diabetes mellitus ([place]) 07/03/2011  . Depression 07/03/2011  . Non-Hodgkin's lymphoma ([place])  07/03/2011  . CAD (coronary artery disease) 07/03/2011  . PAD (peripheral artery disease) ([place]) 07/03/2011  . Vitamin D deficiency 07/03/2011  . Subdural hematoma ([place]) 07/03/2011  . Confusion 05/12/2011  . Subdural hematoma, acute ([place]) 05/12/2011  . HYPERLIPIDEMIA TYPE IIB / III 05/10/2009  . OLD MYOCARDIAL INFARCTION 05/10/2009  . Occlusion and stenosis of carotid artery without mention of cerebral infarction 05/10/2009  . HYPERTENSION, BENIGN 11/03/2008  . MITRAL REGURGITATION 10/29/2008  . ISCHEMIC HEART DISEASE 10/29/2008  . SYSTOLIC HEART FAILURE, CHRONIC 10/29/2008    Orientation RESPIRATION BLADDER Height & Weight     Self  Normal Incontinent Weight: 96 lb 9 oz (43.8 kg) Height:  5' (152.4 cm)  BEHAVIORAL SYMPTOMS/MOOD NEUROLOGICAL BOWEL NUTRITION STATUS  (None) (None) Continent Diet(Heart healthy)  AMBULATORY STATUS COMMUNICATION OF NEEDS Skin   Extensive Assist Verbally Skin abrasions, Bruising, PU Stage and Appropriate Care, Other (Comment)(Blister, Cellulitis, Cracking, Skin tear. Unstageable pressure injuries: Left ischial tuberosity (Foam every 3 days) and right buttocks (Open to air). Deep tissue injury on left buttocks: Open to air.) PU Stage 1 Dressing: (Medial sacrum: Foam every 3 days.) PU Stage 2 Dressing: (Right hip: Foam.)                   Personal Care Assistance Level of Assistance              Functional Limitations Info  Sight, Hearing, Speech Sight Info: Adequate Hearing Info: Adequate Speech Info: Adequate    SPECIAL CARE FACTORS FREQUENCY  PT (By licensed PT)     PT Frequency: 3 x week              Contractures  Contractures Info: Not present    Additional Factors Info  Code Status, Allergies, Isolation Precautions Code Status Info: Full code Allergies Info: Ace Inhibitors     Isolation Precautions Info: Contact precautions     Current Medications (08/17/2018):  This is the current hospital active medication list Current  Facility-Administered Medications  Medication Dose Route Frequency Provider Last Rate Last Dose  . 0.9 %  sodium chloride infusion   Intravenous PRN Mariel Aloe, MD   Stopped at 08/16/18 2043  . acetaminophen (TYLENOL) tablet 650 mg  650 mg Oral Q6H PRN Rise Patience, MD       Or  . acetaminophen (TYLENOL) suppository 650 mg  650 mg Rectal Q6H PRN Rise Patience, MD      . atorvastatin (LIPITOR) tablet 20 mg  20 mg Oral QHS Rise Patience, MD   20 mg at 08/16/18 2229  . carvedilol (COREG) tablet 6.25 mg  6.25 mg Oral BID WC Rise Patience, MD   6.25 mg at 08/17/18 0935  . cephALEXin (KEFLEX) capsule 250 mg  250 mg Oral Q12H Mariel Aloe, MD   250 mg at 08/17/18 0935  . Chlorhexidine Gluconate Cloth 2 % PADS 6 each  6 each Topical Q0600 Mariel Aloe, MD   6 each at 08/17/18 0544  . collagenase (SANTYL) ointment   Topical Daily Mariel Aloe, MD      . eltrombopag [facility]) tablet 75 mg  75 mg Oral Daily Rise Patience, MD      . enoxaparin (LOVENOX) injection 30 mg  30 mg Subcutaneous Q24H Mariel Aloe, MD   30 mg at 08/17/18 1445  . HYDROcodone-acetaminophen (NORCO/VICODIN) 5-325 MG per tablet 1 tablet  1 tablet Oral Q6H PRN Rise Patience, MD      . insulin aspart (novoLOG) injection 0-9 Units  0-9 Units Subcutaneous TID WC Rise Patience, MD      . megestrol (MEGACE) 400 MG/10ML suspension 400 mg  400 mg Oral BID Rise Patience, MD   400 mg at 08/17/18 0935  . methocarbamol (ROBAXIN) tablet 500 mg  500 mg Oral Q6H PRN Rise Patience, MD      . mupirocin cream (BACTROBAN) 2 %   Topical Daily Mariel Aloe, MD      . mupirocin ointment (BACTROBAN) 2 % 1 application  1 application Nasal BID Mariel Aloe, MD   1 application at 21/22/48 [phone]  . ondansetron (ZOFRAN) tablet 4 mg  4 mg Oral Q6H PRN Rise Patience, MD       Or  . ondansetron [facility]) injection 4 mg  4 mg Intravenous Q6H PRN Rise Patience, MD       . sertraline (ZOLOFT) tablet 25 mg  25 mg Oral Daily Rise Patience, MD   25 mg at 08/17/18 0935  . traZODone (DESYREL) tablet 50 mg  50 mg Oral QHS Rise Patience, MD   50 mg at 08/15/18 2218     Discharge Medications: STOP taking these medications       ciprofloxacin 0.3 % ophthalmic solution Commonly known as:  CILOXAN   docusate sodium 100 MG capsule Commonly known as:  COLACE   doxycycline 100 MG tablet Commonly known as:  VIBRA-TABS   feeding supplement (ENSURE ENLIVE) Liqd   furosemide 20 MG tablet Commonly known as:  LASIX   spironolactone 25 MG tablet Commonly known as:  ALDACTONE  TAKE these medications       atorvastatin 20 MG tablet Commonly known as:  LIPITOR TAKE 1 TABLET (20 MG TOTAL) BY MOUTH DAILY. What changed:  See the new instructions.   calcium-vitamin D 500-200 MG-UNIT tablet Take 1 tablet by mouth daily.   carvedilol 6.25 MG tablet Commonly known as:  COREG Take 1 tablet (6.25 mg total) by mouth 2 (two) times daily with a meal.   cephALEXin 250 MG capsule Commonly known as:  KEFLEX Take 1 capsule (250 mg total) by mouth every 12 (twelve) hours for 4 days.   collagenase ointment Commonly known as:  SANTYL Apply topically daily. Apply Santyl to left ischium, right buttock Q day, then cover with moist 2X2 and foam dressing.  (Change foam dressing Q 3 days or PRN soiling.) Start taking on:  August 18, 2018   HYDROcodone-acetaminophen 5-325 MG tablet Commonly known as:  NORCO/VICODIN Take 1 tablet by mouth every 6 (six) hours as needed (for pain).   LACTOBACILLUS PO Take 2 capsules by mouth 3 (three) times daily.   megestrol 40 MG/ML suspension Commonly known as:  MEGACE Take 400 mg by mouth 2 (two) times daily.   methocarbamol 500 MG tablet Commonly known as:  ROBAXIN Take 1 tablet (500 mg total) by mouth every 6 (six) hours as needed for muscle spasms.   mupirocin ointment 2 % Commonly  known as:  BACTROBAN Place 1 application into the nose 2 (two) times daily for 4 days.   PRESERVISION AREDS PO Take 1 tablet by mouth daily.   PROMACTA 75 MG tablet Generic drug:  eltrombopag TAKE 1 TABLET (75 MG TOTAL) BY MOUTH DAILY. TAKE ON AN EMPTY STOMACH 1 HOUR BEFORE MEALS OR 2 HOURS AFTER. What changed:  See the new instructions.   REFRESH OPTIVE 1-0.9 % Gel Generic drug:  Carboxymethylcellul-Glycerin Place 2 drops into both eyes 3 (three) times daily.   sertraline 25 MG tablet Commonly known as:  ZOLOFT Take 25 mg by mouth daily.   traZODone 50 MG tablet Commonly known as:  DESYREL Take 50 mg by mouth at bedtime.   ZEASORB powder Generic drug:  talc Apply 1 application topically every 12 (twelve) hours as needed (UNDER BREASTS, for irritation).     Relevant Imaging Results:  Relevant Lab Results:   Additional Information SS#: [id number]  Candie Chroman, LCSW

## 2018-08-17 NOTE — Clinical Social Work Note (Signed)
Clinical Social Work Assessment  Patient Details  Name: Lisa James MRN: [id number] Date of Birth: 12-24-1927  Date of referral:  08/17/18               Reason for consult:  Discharge Planning                Permission sought to share information with:  Facility Sport and exercise psychologist, Family Supports Permission granted to share information::     Name::     [place]::  [facility] memory care ALF  Relationship::  Niece  Contact Information:  [phone]  Housing/Transportation Living arrangements for the past 2 months:  [place] of Information:  Medical Team, Facility, Other (Comment Required)(Niece) Patient Interpreter Needed:  None Criminal Activity/Legal Involvement Pertinent to Current Situation/Hospitalization:  No - Comment as needed Significant Relationships:  Other Family Members(Niece) Lives with:  Facility Resident Do you feel safe going back to the place where you live?  Yes Need for family participation in patient care:  Yes (Comment)  Care giving concerns:  Patient is a resident at [place]F.   Social Worker assessment / plan:  Patient only oriented to self. No supports at bedside. CSW called patient's niece, introduced role, and explained that discharge planning would be discussed. Patient's niece confirmed plan to return to ALF at discharge with HHPT. She was unsure they would accept her back. CSW spoke with staff and they are willing to take her back with HHPT and RN and want [place] to order her a wheelchair and walker. RNCM aware. No further concerns. CSW encouraged patient's niece to contact CSW as needed. CSW will continue to follow patient and her niece for support and facilitate discharge back to ALF once medically stable.  Employment status:  Retired Nurse, adult PT Recommendations:  Home with [place] / Referral to community resources:  Other (Comment  Required)(Plan is to return to ALF)  Patient/Family's Response to care:  Patient only oriented to self. Patient's niece agreeable to return to ALF. Patient's niece supportive and involved in patient's care. Patient's niece appreciated social work intervention.  Patient/Family's Understanding of and Emotional Response to Diagnosis, Current Treatment, and Prognosis:  Patient only oriented to self. Patient's niece has a good understanding of the reason for admission and plan to return to ALF once medically stable. Patient's niece appears happy with hospital care.  Emotional Assessment Appearance:  Appears stated age Attitude/Demeanor/Rapport:  Unable to Assess Affect (typically observed):  Unable to Assess Orientation:  Oriented to Self Alcohol / Substance use:  Never Used Psych involvement (Current and /or in the community):  No (Comment)  Discharge Needs  Concerns to be addressed:  Care Coordination Readmission within the last 30 days:  Yes Current discharge risk:  Dependent with Mobility Barriers to Discharge:  Continued Medical Work up   Candie Chroman, LCSW 08/17/2018, 12:27 PM

## 2018-08-17 NOTE — Consult Note (Signed)
[place] Nurse wound consult note Reason for Consult: Consult requested for several sites.  Wound type: Left ischium 2X2X.1cm, unstageable pressure injury, 100% yellow and moist, small amt tan drainage, no odor or fluctuance Sacrum with "dimple" area of unknown etiology, .2X.2cm, slightly below skin level, this is NOT a pressure injury.  Intact skin without open wound, fluctuance, or drainage. Right buttock with unstageable pressure injury; 1X1X.1cm, 100% yellow and moist, no odor, small amt tan drainage, no fluctuance Right elbow with partial thickness abrasion; .2X.2X.1cm, red and moist, small amt tan drainage Right foot with full thickness abrasion; 3X4X.1cm, dark red and dry, no odor, drainage, or fluctuance Right buttock with stage 2 pressure injury; 1X1X.1cm, pink and dry, no odor, small amt tan drainage Pressure Injury POA: Yes Dressing procedure/placement/frequency: Foam dressing to protect and promte healing to wounds.  Santyl to provide enzymatic debridement to left ischium and right buttock.  Bactroban to promote moist healing to right foot. Pt is confused and there is no family present to discuss plan of care. Please re-consult if further assistance is needed.  Thank-you,  Julien Girt MSN, [place], [place], [place], [place]

## 2018-08-17 NOTE — Clinical Social Work Note (Signed)
CSW facilitated patient discharge including contacting patient family and facility to confirm patient discharge plans. Clinical information faxed to facility and family agreeable with plan. Patient's niece will transport by car back to [facility] memory care ALF. RN to call report prior to discharge [phone]).  CSW will sign off for now as social work intervention is no longer needed. Please consult [place] again if new needs arise.  Dayton Scrape, [place]

## 2018-08-17 NOTE — Evaluation (Addendum)
Physical Therapy Evaluation Patient Details Name: Lisa James MRN: [id number] DOB: [birth year] Today's Date: 08/17/2018   History of Present Illness  Patient is a 85 y/o female who presents with BLE swelling and erythema. Found to have RLE cellulitis. PMH includes dementia, DM, HTN, SDH, CKD, chronic systolic heart failure, CAD, non-hodgkin's lymphoma.   Clinical Impression  Patient presents with baseline confusion/dementia, pain, impaired balance and impaired mobility s/p above. Pt with wound on right foot that is bleeding, RN present and aware. Pt not able to provide PLOF/history due to dementia but comes from [facility] memory care ALF and was working with PT PTA. Tolerated SPT to chair with Mod A for balance. Able to stand from chair with RW for support with Min A. Pt resisting movement at times but able to be easily redirected to task. If facility able to take her back at this level, recommend return to ALF with HHPT. Will follow acutely to maximize independence and mobility prior to return to SNF.     Follow Up Recommendations Home health PT;Supervision for mobility/OOB    Equipment Recommendations  None recommended by PT    Recommendations for Other Services       Precautions / Restrictions Precautions Precautions: Fall Restrictions Weight Bearing Restrictions: No      Mobility  Bed Mobility Overal bed mobility: Needs Assistance Bed Mobility: Sidelying to Sit   Sidelying to sit: Max assist;+2 for physical assistance;HOB elevated       General bed mobility comments: Assist with LEs, bottom and elevate trunk to get to EOB, resisting and pushing posteriorly initially. Able to be redirected to task.  Transfers Overall transfer level: Needs assistance Equipment used: None;Rolling walker (2 wheeled) Transfers: Sit to/from [facility] Sit to Stand: Min assist Stand pivot transfers: Mod assist       General transfer comment: Assist to power to standing  with increased flexion at hips and pushing posteriorly through trunk. Performed x2. SPT bed to chair with Mod A- able to take a few steps during transfers. Stood from chair with RW support for [employer].  Ambulation/Gait                Stairs            Wheelchair Mobility    Modified Rankin (Stroke Patients Only)       Balance Overall balance assessment: Needs assistance Sitting-balance support: Feet supported;No upper extremity supported Sitting balance-Leahy Scale: Fair Sitting balance - Comments: Able to sit statically without external support for short periods but then pushing back to return to supine   Standing balance support: During functional activity Standing balance-Leahy Scale: Poor Standing balance comment: Requires external support for standing balance.                              Pertinent Vitals/Pain Pain Assessment: Faces Faces Pain Scale: Hurts even more Pain Location: BLEs with movement- general Pain Descriptors / Indicators: Grimacing;Guarding Pain Intervention(s): Monitored during session;Limited activity within patient's tolerance    Home Living Family/patient expects to be discharged to:: Skilled nursing facility                 Additional Comments: Per chart review, pt is from [facility].    Prior Function Level of Independence: Needs assistance   Gait / Transfers Assistance Needed: Has been working with PT at [facility].  ADL's / Homemaking Assistance Needed: Likely requires assist for ADLs.  Comments:  Pt not able to provide information on PLOF.     Hand Dominance   Dominant Hand: Right    Extremity/Trunk Assessment   Upper Extremity Assessment Upper Extremity Assessment: Defer to OT evaluation    Lower Extremity Assessment Lower Extremity Assessment: Difficult to assess due to impaired cognition;RLE deficits/detail(Able to perform LAQ BLEs without difficulty. ) RLE Deficits / Details: open blister that popped  on dorsum of foot- bleeding. RN notified.     Cervical / Trunk Assessment Cervical / Trunk Assessment: Kyphotic  Communication   Communication: HOH  Cognition Arousal/Alertness: Awake/alert Behavior During Therapy: WFL for tasks assessed/performed Overall Cognitive Status: Impaired/Different from baseline Area of Impairment: Orientation;Memory;Problem solving;Following commands                 Orientation Level: Disoriented to;Place;Time;Situation   Memory: Decreased short-term memory;Decreased recall of precautions Following Commands: Follows one step commands with increased time;Follows one step commands inconsistently     Problem Solving: Requires verbal cues;Requires tactile cues General Comments: Reports being at the "[employer]oshop or something like that." Per chart, pt is oriented to self only at baseline. Able to be redirected easily during session. Iniitially resisting PT but this improved.       General Comments      Exercises     Assessment/Plan    PT Assessment Patient needs continued PT services  PT Problem List Decreased strength;Decreased mobility;Decreased safety awareness;Pain;Decreased balance;Decreased cognition;Decreased skin integrity       PT Treatment Interventions Functional mobility training;Balance training;Patient/family education;Therapeutic activities;Therapeutic exercise;Gait training;Wheelchair mobility training;DME instruction    PT Goals (Current goals can be found in the Care Plan section)  Acute Rehab PT Goals Patient Stated Goal: none stated, "take care of me." "be good to me" PT Goal Formulation: Patient unable to participate in goal setting Time For Goal Achievement: 08/31/18 Potential to Achieve Goals: Fair    Frequency Min 3X/week   Barriers to discharge        Co-evaluation               AM-PAC PT "6 Clicks" Mobility  Outcome Measure Help needed turning from your back to your side while in a flat bed without using  bedrails?: A Little Help needed moving from lying on your back to sitting on the side of a flat bed without using bedrails?: A Lot Help needed moving to and from a bed to a chair (including a wheelchair)?: A Lot Help needed standing up from a chair using your arms (e.g., wheelchair or bedside chair)?: A Little Help needed to walk in hospital room?: A Lot Help needed climbing 3-5 steps with a railing? : Total 6 Click Score: 13    End of Session Equipment Utilized During Treatment: Gait belt Activity Tolerance: Patient tolerated treatment well Patient left: in chair;with call bell/phone within reach;with chair alarm set;with nursing/sitter in room Nurse Communication: Mobility status PT Visit Diagnosis: Pain;Difficulty in walking, not elsewhere classified (R26.2);Unsteadiness on feet (R26.81) Pain - Right/Left: (bil) Pain - part of body: Leg    Time: 9767-3419 PT Time Calculation (min) (ACUTE ONLY): 17 min   Charges:   PT Evaluation $PT Eval Moderate Complexity: 1 Mod          Wray Kearns, PT, DPT Acute Rehabilitation Services Pager [phone] Office [phone]      Marguarite Arbour A Sabra Heck 08/17/2018, 8:47 AM

## 2018-08-17 NOTE — Progress Notes (Signed)
Pt has orders to be discharged to ALF. Discharge instructions given to niece, Anne Ng. Telemetry box removed. IV removed and site in good condition. Report given to [facility]. Pt stable and niece will drive pt to [facility].

## 2018-08-19 ENCOUNTER — Telehealth: Payer: Self-pay

## 2018-08-19 DIAGNOSIS — S90821D Blister (nonthermal), right foot, subsequent encounter: Secondary | ICD-10-CM | POA: Diagnosis not present

## 2018-08-19 DIAGNOSIS — E119 Type 2 diabetes mellitus without complications: Secondary | ICD-10-CM | POA: Diagnosis not present

## 2018-08-19 DIAGNOSIS — C8580 Other specified types of non-Hodgkin lymphoma, unspecified site: Secondary | ICD-10-CM | POA: Diagnosis not present

## 2018-08-19 DIAGNOSIS — I5032 Chronic diastolic (congestive) heart failure: Secondary | ICD-10-CM | POA: Diagnosis not present

## 2018-08-19 DIAGNOSIS — N183 Chronic kidney disease, stage 3 (moderate): Secondary | ICD-10-CM | POA: Diagnosis not present

## 2018-08-19 DIAGNOSIS — I10 Essential (primary) hypertension: Secondary | ICD-10-CM | POA: Diagnosis not present

## 2018-08-19 DIAGNOSIS — I2510 Atherosclerotic heart disease of native coronary artery without angina pectoris: Secondary | ICD-10-CM | POA: Diagnosis not present

## 2018-08-19 LAB — CULTURE, BLOOD (ROUTINE X 2)
Culture: NO GROWTH
Culture: NO GROWTH
Special Requests: ADEQUATE
Special Requests: ADEQUATE

## 2018-08-19 NOTE — Telephone Encounter (Signed)
Oral Oncology Patient Advocate Encounter  [place] notified me that the patients copay for her Promacta is $2742.73. There are currently no grant funds available for her disease state. I am able to assist the patient in applying for [employer] assistance program. I called the patients niece, Anne Ng and went over this information with her, she agreed to proceed with the manufacturer assistance application. Anne Ng stated that the patient will have a better insurance plan effective 2/1 but she would still like for [place] to apply for assistance because her copay will be $100 at that time.  This encounter will be updated until final determination.  [place] Patient [place] Phone [phone] Fax [phone]

## 2018-08-20 NOTE — Telephone Encounter (Signed)
Oral Oncology Patient Advocate Encounter  Completed application was faxed 03/02/74.  This encounter will be updated until final determination.  [place] Patient [place] Phone [phone] Fax [phone]

## 2018-08-21 DIAGNOSIS — C8580 Other specified types of non-Hodgkin lymphoma, unspecified site: Secondary | ICD-10-CM | POA: Diagnosis not present

## 2018-08-21 DIAGNOSIS — I5032 Chronic diastolic (congestive) heart failure: Secondary | ICD-10-CM | POA: Diagnosis not present

## 2018-08-21 DIAGNOSIS — I10 Essential (primary) hypertension: Secondary | ICD-10-CM | POA: Diagnosis not present

## 2018-08-21 DIAGNOSIS — E119 Type 2 diabetes mellitus without complications: Secondary | ICD-10-CM | POA: Diagnosis not present

## 2018-08-21 DIAGNOSIS — I2510 Atherosclerotic heart disease of native coronary artery without angina pectoris: Secondary | ICD-10-CM | POA: Diagnosis not present

## 2018-08-21 DIAGNOSIS — S90821D Blister (nonthermal), right foot, subsequent encounter: Secondary | ICD-10-CM | POA: Diagnosis not present

## 2018-08-21 DIAGNOSIS — N183 Chronic kidney disease, stage 3 (moderate): Secondary | ICD-10-CM | POA: Diagnosis not present

## 2018-08-27 ENCOUNTER — Other Ambulatory Visit: Payer: Medicare Other

## 2018-08-27 ENCOUNTER — Ambulatory Visit: Payer: Medicare Other

## 2018-08-27 NOTE — Telephone Encounter (Signed)
Oral Oncology Patient Advocate Encounter  [employer] called to let me know that the application is complete and in the last stages of processing.  This encounter will be updated until final determination.  [place] Patient [place] Phone [phone] Fax [phone]

## 2018-09-01 NOTE — Telephone Encounter (Signed)
Oral Oncology Patient Advocate [employer] requested a Prior Authorization.  I faxed the approval letter today 09/01/18.  This encounter will be updated until final determination.  [place] Patient [place] Phone [phone] Fax [phone]

## 2018-09-01 NOTE — Telephone Encounter (Addendum)
Oral Oncology Patient Advocate Encounter  [employer] need proof of income. I called the patients niece and she will be able to provide that, she will email or fax it to me tomorrow 09/02/18.  This encounter will be updated until final determination.  [place] Patient [place] Phone [phone] Fax [phone]

## 2018-09-02 NOTE — Telephone Encounter (Signed)
Oral Oncology Patient Advocate Encounter  2018 Tax Return faxed on 09/02/18.  This encounter will be updated until final determination.  [place] Patient [place] Phone [phone] Fax [phone]

## 2018-09-03 ENCOUNTER — Emergency Department (HOSPITAL_COMMUNITY)
Admission: EM | Admit: 2018-09-03 | Discharge: 2018-09-03 | Disposition: A | Discharge status: Home / Self Care | Payer: Medicare Other | Attending: Emergency Medicine | Admitting: Emergency Medicine

## 2018-09-03 ENCOUNTER — Encounter (HOSPITAL_COMMUNITY): Payer: Self-pay

## 2018-09-03 ENCOUNTER — Other Ambulatory Visit: Payer: Self-pay

## 2018-09-03 DIAGNOSIS — Y929 Unspecified place or not applicable: Secondary | ICD-10-CM | POA: Insufficient documentation

## 2018-09-03 DIAGNOSIS — Z951 Presence of aortocoronary bypass graft: Secondary | ICD-10-CM | POA: Insufficient documentation

## 2018-09-03 DIAGNOSIS — F0390 Unspecified dementia without behavioral disturbance: Secondary | ICD-10-CM | POA: Diagnosis present

## 2018-09-03 DIAGNOSIS — I130 Hypertensive heart and chronic kidney disease with heart failure and stage 1 through stage 4 chronic kidney disease, or unspecified chronic kidney disease: Secondary | ICD-10-CM | POA: Diagnosis not present

## 2018-09-03 DIAGNOSIS — Y92129 Unspecified place in nursing home as the place of occurrence of the external cause: Secondary | ICD-10-CM

## 2018-09-03 DIAGNOSIS — C8591 Non-Hodgkin lymphoma, unspecified, lymph nodes of head, face, and neck: Secondary | ICD-10-CM | POA: Insufficient documentation

## 2018-09-03 DIAGNOSIS — E1122 Type 2 diabetes mellitus with diabetic chronic kidney disease: Secondary | ICD-10-CM | POA: Insufficient documentation

## 2018-09-03 DIAGNOSIS — Y939 Activity, unspecified: Secondary | ICD-10-CM | POA: Diagnosis not present

## 2018-09-03 DIAGNOSIS — W19XXXA Unspecified fall, initial encounter: Secondary | ICD-10-CM | POA: Diagnosis not present

## 2018-09-03 DIAGNOSIS — I5022 Chronic systolic (congestive) heart failure: Secondary | ICD-10-CM | POA: Diagnosis not present

## 2018-09-03 DIAGNOSIS — I2510 Atherosclerotic heart disease of native coronary artery without angina pectoris: Secondary | ICD-10-CM | POA: Diagnosis not present

## 2018-09-03 DIAGNOSIS — Y999 Unspecified external cause status: Secondary | ICD-10-CM | POA: Diagnosis not present

## 2018-09-03 DIAGNOSIS — N189 Chronic kidney disease, unspecified: Secondary | ICD-10-CM | POA: Insufficient documentation

## 2018-09-03 DIAGNOSIS — Z79899 Other long term (current) drug therapy: Secondary | ICD-10-CM | POA: Diagnosis not present

## 2018-09-03 NOTE — ED Notes (Signed)
Report given to [place] at [facility]

## 2018-09-03 NOTE — ED Notes (Signed)
PTAR at bedside 

## 2018-09-03 NOTE — ED Notes (Signed)
Bed: WV79 Expected date:  Expected time:  Means of arrival:  Comments: fall

## 2018-09-03 NOTE — ED Provider Notes (Signed)
[facility] DEPT Provider Note: Lisa Spurling, MD, FACEP  CSN: [id number] MRN: [id number] ARRIVAL: 09/03/18 at [place]  Fall  Level 5 caveat: Dementia HISTORY OF PRESENT ILLNESS  09/03/18 4:44 AM Lisa James is a 85 y.o. female with a history of dementia.  She was brought in after an unwitnessed fall.  There is no apparent injury and she has no complaints.  She is not on anticoagulation.   Past Medical History:  Diagnosis Date  . Acute MI, lateral wall ([place])   . Aortic stenosis   . Chronic kidney disease   . Chronic systolic heart failure ([place])   . Coronary artery disease    sees Dr Claiborne Billings every 6 months  . Dementia ([place])   . Diabetes ([place])   . DM (diabetes mellitus) ([place])    type 2 ; diet controlled  . Heart disease   . HTN (hypertension)   . Immune thrombocytopenic purpura ([place]) 01/21/2013  . Ischemic heart disease   . Kidney disease   . Mitral regurgitation   . Non Hodgkin's lymphoma ([place])    of the throat  . Subdural hematoma, post-traumatic ([place]) 2015   sees Dr Sherwood Gambler every 6 months  . Thrombocytopenia ([place])    sees Dr Marin Olp every 2 weeks ;receives Nplate prn    Past Surgical History:  Procedure Laterality Date  . BONE MARROW BIOPSY  11/22/2002   left posterior iliac crest bone marrow biopsy and aspirate  . CATARACT EXTRACTION W/ INTRAOCULAR LENS  IMPLANT, BILATERAL Bilateral   . CORONARY ARTERY BYPASS GRAFT  11/20/2007   x5  . EYE SURGERY    . ORIF HUMERUS FRACTURE Right 05/28/2018   Procedure: RIGHT OPEN REDUCTION INTERNAL FIXATION (ORIF) PROXIMAL HUMERUS FRACTURE;  Surgeon: Meredith Pel, MD;  Location: [facility];  Service: Orthopedics;  Laterality: Right;  . submental lymph node excisional biopsy  10/22/2002    Family History  Problem Relation Age of Onset  . Heart attack Father   . Diabetes Father     Social History   Tobacco Use  . Smoking status: Never Smoker  . Smokeless tobacco: Never Used  . Tobacco  comment: never used tobacco.  Substance Use Topics  . Alcohol use: No    Alcohol/week: 0.0 standard drinks  . Drug use: No    Prior to Admission medications   Medication Sig Start Date End Date Taking? Authorizing Provider  atorvastatin (LIPITOR) 20 MG tablet TAKE 1 TABLET (20 MG TOTAL) BY MOUTH DAILY. Patient taking differently: Take 20 mg by mouth at bedtime.  01/29/16   Darlyne Russian, MD  Calcium Carbonate-Vitamin D (CALCIUM-VITAMIN D) 500-200 MG-UNIT per tablet Take 1 tablet by mouth daily.     [provider]  Carboxymethylcellul-Glycerin (REFRESH OPTIVE) 1-0.9 % GEL Place 2 drops into both eyes 3 (three) times daily.    [provider]  carvedilol (COREG) 6.25 MG tablet Take 1 tablet (6.25 mg total) by mouth 2 (two) times daily with a meal. 07/28/15   Daub, Loura Back, MD  collagenase (SANTYL) ointment Apply topically daily. Apply Santyl to left ischium, right buttock Q day, then cover with moist 2X2 and foam dressing.  (Change foam dressing Q 3 days or PRN soiling.) 08/18/18   Mariel Aloe, MD  HYDROcodone-acetaminophen (NORCO/VICODIN) 5-325 MG tablet Take 1 tablet by mouth every 6 (six) hours as needed (for pain). 08/17/18   Mariel Aloe, MD  LACTOBACILLUS PO Take 2 capsules by  mouth 3 (three) times daily.    [provider]  megestrol (MEGACE) 40 MG/ML suspension Take 400 mg by mouth 2 (two) times daily.    [provider]  methocarbamol (ROBAXIN) 500 MG tablet Take 1 tablet (500 mg total) by mouth every 6 (six) hours as needed for muscle spasms. 05/29/18   Meredith Pel, MD  Multiple Vitamins-Minerals (PRESERVISION AREDS PO) Take 1 tablet by mouth daily.    [provider]  PROMACTA 75 MG tablet TAKE 1 TABLET (75 MG TOTAL) BY MOUTH DAILY. TAKE ON AN EMPTY STOMACH 1 HOUR BEFORE MEALS OR 2 HOURS AFTER. Patient taking differently: Take 75 mg by mouth daily.  08/10/18   Truitt Merle, MD  sertraline (ZOLOFT) 25 MG tablet Take 25 mg by mouth  daily.    [provider]  talc (ZEASORB) powder Apply 1 application topically every 12 (twelve) hours as needed (UNDER BREASTS, for irritation).    [provider]  traZODone (DESYREL) 50 MG tablet Take 50 mg by mouth at bedtime.  06/01/18 08/14/26  [provider]    Allergies Ace inhibitors   REVIEW OF SYSTEMS     PHYSICAL EXAMINATION  Initial Vital Signs Blood pressure (!) 150/95, pulse 61, temperature 98.4 F (36.9 C), temperature source Oral, resp. rate 15, height 5' (1.524 m), weight 43 kg, SpO2 98 %.  Examination General: Well-developed, well-nourished female in no acute distress; appearance consistent with age of record HENT: normocephalic; atraumatic Eyes: pupils equal, round and reactive to light; extraocular muscles intact Neck: supple; nontender; limited range of motion due to chronic changes Heart: regular rate and rhythm Lungs: clear to auscultation bilaterally Abdomen: soft; nondistended; nontender; bowel sounds present Extremities: Arthritic changes; no tenderness or pain on passive range of motion; pulses normal Neurologic: Awake, alert and oriented x 2; motor function intact in all extremities and symmetric; no facial droop Skin: Warm and dry Psychiatric: Normal mood and affect   RESULTS  Summary of this visit's results, reviewed by myself:   EKG Interpretation  Date/Time:    Ventricular Rate:    PR Interval:    QRS Duration:   QT Interval:    QTC Calculation:   R Axis:     Text Interpretation:        Laboratory Studies: No results found for this or any previous visit (from the past 24 hour(s)). Imaging Studies: No results found.  ED COURSE and MDM  Nursing notes and initial vitals signs, including pulse oximetry, reviewed.  Vitals:   09/03/18 0404 09/03/18 0405 09/03/18 0406  BP: (!) 150/95    Pulse: 61    Resp: 15    Temp: 98.4 F (36.9 C)    TempSrc: Oral    SpO2: 100% 98%   Weight:   43 kg  Height:   5'  (1.524 m)   4:46 AM Patient became agitated while waiting for transport back to her facility.  PROCEDURES    ED DIAGNOSES     ICD-10-CM   1. Fall at nursing home, initial encounter W19.XXXA    Y92.129        Shanon Rosser, MD 09/03/18 [phone]

## 2018-09-03 NOTE — ED Triage Notes (Signed)
Per ptar: pt coming from [facility] c/o unwitnessed fall. Hx of dementia. No blood thinners and no abrasions. No complaints of any pain

## 2018-09-07 NOTE — Telephone Encounter (Signed)
Oral Oncology Patient Advocate Encounter  I followed up with [employer] manufacturer assistance on the [place] application. [employer] stated that the application is currently in benefit investigation then it will go into final processing.  This encounter will be updated until final determination.  Lisa James Phone [phone] Fax [phone]

## 2018-09-14 MED FILL — PROMACTA 75 MG TABS: 75 | 30 days supply | Qty: 30 | Fill #0

## 2018-09-14 NOTE — Telephone Encounter (Signed)
Oral Oncology Patient Advocate Encounter  I followed up with [employer] today and the application is still in benefit investigation. I requested that it be expedited since it has been in the same spot of processing for several days. I will continue to follow up.  I called the patients niece, Anne Ng and gave her this information. During our discussion Anne Ng decided to go ahead and fill the promacta at [place] since the new insurance has taken effect and the copay is $100. Anne Ng did not want her to go to much longer without taking the medicine. I assured her that I would continue to follow with [employer] and let her know when I have a decision from [employer].  Anne Ng verbalized understanding and great appreciation.  [place] Patient [place] Phone [phone] Fax [phone] 09/14/2018   11:58 AM

## 2018-09-17 ENCOUNTER — Ambulatory Visit: Payer: Medicare Other

## 2018-09-17 ENCOUNTER — Other Ambulatory Visit: Payer: Medicare Other

## 2018-09-22 NOTE — Telephone Encounter (Signed)
Oral Oncology Patient Advocate Encounter  I followed up with [employer] today, the application is in the last steps with insurance/other assistance verification. I explained that I called on 2/24 and this was going into that process at that time and asked for it to be expedited. The representative stated she would request expedited processing. When the step is complete it will then roll over to get approved or denied by [employer] for manufacturer assistance.   This encounter will be updated until final determination.  [place] Patient [place] [facility] [place] Phone [phone] Fax [phone] 09/22/2018   10:42 AM

## 2018-09-28 NOTE — Telephone Encounter (Signed)
Oral Oncology Patient Advocate Encounter  I followed up with [employer] today and they stated that the application is still in insurance verification. I explained how long it has been sitting there and she explained that they are very overwhelmed with new applications and re-enrollments. She will put a note stating that it has been almost a month since we started this application and request expedited processing.  This encounter will be updated until final determination.  [place] Patient [place] Phone [phone] Fax [phone] 09/28/2018   1:24 PM

## 2018-10-01 NOTE — Telephone Encounter (Signed)
Oral Oncology Patient Advocate Encounter  I received notification that the benefit investigation has been completed. I called to let them know that the patient still needed assistance and it is now in processing for a final decision.   This encounter will be updated until final determination.   Lisa James Phone [phone] Fax [phone] 10/01/2018   10:57 AM

## 2018-10-05 ENCOUNTER — Telehealth: Payer: Self-pay

## 2018-10-05 NOTE — Telephone Encounter (Signed)
Oral Oncology Patient Advocate Encounter  Lisa James has been approved to be filled through [employer] patient assistance program 10/02/18-07/22/19. Promacta will be filled with their contracted pharmacy, [facility] [place] and shipped to the patient free of charge.   I called the patients niece, Lisa James and gave her the good news. I also gave her the phone number to call to get the promacta filled. That number is [phone]. Lisa James verbalized understanding and great appreciation.  [place] Patient [place] Phone [phone] Fax [phone] 10/05/2018   9:31 AM

## 2018-10-05 NOTE — Telephone Encounter (Signed)
Patient's niece calls stating she does not want to take her out of [facility] place for appointment on Friday, faxed order to nursing home to draw blood on Friday 3/20 and fax results to [place], sent scheduling message to reschedule for 3 months, called her back to let her know.

## 2018-10-09 ENCOUNTER — Ambulatory Visit: Payer: Medicare Other | Admitting: Hematology

## 2018-10-09 ENCOUNTER — Other Ambulatory Visit: Payer: Medicare Other

## 2018-10-12 ENCOUNTER — Telehealth: Payer: Self-pay

## 2018-10-12 NOTE — Telephone Encounter (Signed)
Left voice message for Laqueta Jean regarding lab results that were faxed to [place].  No concerns per Dr. Burr Medico.

## 2018-11-03 ENCOUNTER — Other Ambulatory Visit: Payer: Self-pay | Admitting: Hematology

## 2018-11-04 ENCOUNTER — Other Ambulatory Visit: Payer: Self-pay

## 2018-11-04 DIAGNOSIS — D693 Immune thrombocytopenic purpura: Secondary | ICD-10-CM

## 2018-11-04 MED ORDER — ELTROMBOPAG OLAMINE 75 MG PO TABS: ORAL_TABLET | ORAL | 0 refills | Status: DC

## 2018-12-22 ENCOUNTER — Telehealth: Payer: Self-pay

## 2018-12-22 NOTE — Telephone Encounter (Signed)
Faxed script for Promacta back to [facility] [place], fax [phone], sent to HIM for scan.

## 2019-01-08 NOTE — Progress Notes (Signed)
Lisa James   Telephone:([phone]) [phone] [phone]   Clinic Follow up Note   Patient Care Team: Merrilee Seashore, MD as PCP - General (Internal Medicine) Truitt Merle, MD as Consulting Physician (Hematology)   I connected with Lisa James on 01/15/2019 at  2:15 PM EDT by video enabled telemedicine visit and verified that I am speaking with the correct person using two identifiers.  I discussed the limitations, risks, security and privacy concerns of performing an evaluation and management service by telephone and the availability of in person appointments. I also discussed with the patient that there may be a patient responsible charge related to this service. The patient expressed understanding and agreed to proceed.   Other persons participating in the visit and their role in the encounter: Nurse Aid, [place]  Patient's location: Nursing home  Provider's location:  My Office   CHIEF COMPLAINT: F/u of ITP    CURRENT THERAPY:  -Nplate 12mg/kg every 3 weeks to maintain plt>50K and <400K and dose adjust as needed, last dose on 04/23/18.  -I switched her to oral Promacta 536mdaily in 04/2018. Increased to 7541maily on 07/10/18  INTERVAL HISTORY:  Lisa James here for a follow up of ITP. She was able to identify herself by face to face video. Nurse Aid is present given her dementia. Nursing aid notes she has been taking Promacta 53m66mily. She notes she is doing well today. She denies any pain. Per aid she does not complain much about anything. They pay attention to her body language for clues. She is not able to walk independently. She ambulates with assistance. She is eating adequately and on puree diet and has to been spoon fed. She is on ensure supplement as well. She denies aspiration with eating. Her BM are adequate, no evidence of bleeding or skin tears.    REVIEW OF SYSTEMS:   Constitutional: Denies fevers, chills or abnormal weight loss Eyes:  Denies blurriness of vision Ears, nose, mouth, throat, and face: Denies mucositis or sore throat Respiratory: Denies cough, dyspnea or wheezes Cardiovascular: Denies palpitation, chest discomfort or lower extremity swelling Gastrointestinal:  Denies nausea, heartburn or change in bowel habits Skin: Denies abnormal skin rashes Lymphatics: Denies new lymphadenopathy or easy bruising Neurological:Denies numbness, tingling or new weaknesses Behavioral/Psych: Mood is stable, no new changes (+) Dementia (+) ambulates with assistance.  All other systems were reviewed with the patient and are negative.  MEDICAL HISTORY:  Past Medical History:  Diagnosis Date  . Acute MI, lateral wall (HCC)[place]. Aortic stenosis   . Chronic kidney disease   . Chronic systolic heart failure (HCC)[place]. Coronary artery disease    sees Dr KellClaiborne Billingsry 6 months  . Dementia (HCC)[place]. Diabetes (HCC)[place]. DM (diabetes mellitus) (HCC)[place] type 2 ; diet controlled  . Heart disease   . HTN (hypertension)   . Immune thrombocytopenic purpura (HCC)[place]3/2014  . Ischemic heart disease   . Kidney disease   . Mitral regurgitation   . Non Hodgkin's lymphoma (HCC)[place] of the throat  . Subdural hematoma, post-traumatic (HCC)[place]15   sees Dr NudeSherwood Gamblerry 6 months  . Thrombocytopenia (HCC)[place] sees Dr EnneMarin Olpry 2 weeks ;receives Nplate prn    SURGICAL HISTORY: Past Surgical History:  Procedure Laterality Date  . BONE MARROW BIOPSY  11/22/2002   left posterior iliac crest bone marrow biopsy and  aspirate  . CATARACT EXTRACTION W/ INTRAOCULAR LENS  IMPLANT, BILATERAL Bilateral   . CORONARY ARTERY BYPASS GRAFT  11/20/2007   x5  . EYE SURGERY    . ORIF HUMERUS FRACTURE Right 05/28/2018   Procedure: RIGHT OPEN REDUCTION INTERNAL FIXATION (ORIF) PROXIMAL HUMERUS FRACTURE;  Surgeon: Meredith Pel, MD;  Location: [facility];  Service: Orthopedics;  Laterality: Right;  . submental lymph node excisional biopsy  10/22/2002    I have  reviewed the social history and family history with the patient and they are unchanged from previous note.  ALLERGIES:  is allergic to ace inhibitors.  MEDICATIONS:  Current Outpatient Medications  Medication Sig Dispense Refill  . atorvastatin (LIPITOR) 20 MG tablet TAKE 1 TABLET (20 MG TOTAL) BY MOUTH DAILY. (Patient taking differently: Take 20 mg by mouth at bedtime. ) 30 tablet 0  . Calcium Carbonate-Vitamin D (CALCIUM-VITAMIN D) 500-200 MG-UNIT per tablet Take 1 tablet by mouth daily.     . Carboxymethylcellul-Glycerin (REFRESH OPTIVE) 1-0.9 % GEL Place 2 drops into both eyes 3 (three) times daily.    . carvedilol (COREG) 6.25 MG tablet Take 1 tablet (6.25 mg total) by mouth 2 (two) times daily with a meal. 180 tablet 1  . collagenase (SANTYL) ointment Apply topically daily. Apply Santyl to left ischium, right buttock Q day, then cover with moist 2X2 and foam dressing.  (Change foam dressing Q 3 days or PRN soiling.) 15 g 0  . eltrombopag (PROMACTA) 75 MG tablet TAKE 1 TABLET (75 MG TOTAL) BY MOUTH DAILY. TAKE ON AN EMPTY STOMACH 1 HOUR BEFORE MEALS OR 2 HOURS AFTER. 30 tablet 5  . HYDROcodone-acetaminophen (NORCO/VICODIN) 5-325 MG tablet Take 1 tablet by mouth every 6 (six) hours as needed (for pain). 5 tablet 0  . LACTOBACILLUS PO Take 2 capsules by mouth 3 (three) times daily.    . megestrol (MEGACE) 40 MG/ML suspension Take 400 mg by mouth 2 (two) times daily.    . methocarbamol (ROBAXIN) 500 MG tablet Take 1 tablet (500 mg total) by mouth every 6 (six) hours as needed for muscle spasms. 30 tablet 0  . Multiple Vitamins-Minerals (PRESERVISION AREDS PO) Take 1 tablet by mouth daily.    . sertraline (ZOLOFT) 25 MG tablet Take 25 mg by mouth daily.    [place] talc (ZEASORB) powder Apply 1 application topically every 12 (twelve) hours as needed (UNDER BREASTS, for irritation).    . traZODone (DESYREL) 50 MG tablet Take 50 mg by mouth at bedtime.      No current facility-administered medications  for this visit.     PHYSICAL EXAMINATION: ECOG PERFORMANCE STATUS: 3 - Symptomatic, >50% confined to bed  No vitals taken today, Exam not performed today   LABORATORY DATA:  I have reviewed the data as listed CBC Latest Ref Rng & Units 08/15/2018 08/14/2018 07/14/2018  WBC 4.0 - 10.5 K/uL 6.4 7.4 7.1  Hemoglobin 12.0 - 15.0 g/dL 10.7(L) 11.1(L) 10.3(L)  Hematocrit 36.0 - 46.0 % 29.2(L) 33.3(L) 29.6(L)  Platelets 150 - 400 K/uL 97(L) 130(L) 95(L)     CMP Latest Ref Rng & Units 08/16/2018 08/15/2018 08/14/2018  Glucose 70 - 99 mg/dL 80 75 87  BUN 8 - 23 mg/dL '14 13 14  ' Creatinine 0.44 - 1.00 mg/dL 1.58(H) 1.35(H) 1.38(H)  Sodium 135 - 145 mmol/L 133(L) 134(L) 135  Potassium 3.5 - 5.1 mmol/L 3.3(L) 3.2(L) 3.0(L)  Chloride 98 - 111 mmol/L 97(L) 101 97(L)  CO2 22 - 32 mmol/L 25 23 24  Calcium 8.9 - 10.3 mg/dL 7.9(L) 8.1(L) 8.6(L)  Total Protein 6.5 - 8.1 g/dL 4.7(L) 4.6(L) 5.4(L)  Total Bilirubin 0.3 - 1.2 mg/dL 1.3(H) 1.8(H) 1.4(H)  Alkaline Phos 38 - 126 U/L 50 54 65  AST 15 - 41 U/L '19 21 22  ' ALT 0 - 44 U/L '12 15 14   ' Her outside lab 12/01/2018 CBC: WBC 3.9, hemoglobin 13.4, hematocrit 38.9, MCV 101.8.  plt 125K CMP: BUN 15.7, creatinine 1.12, sodium 132, potassium 4.7 chloride 92, total bilirubin 1.2, total protein 4.93.1, ALT 12, AST 20, alk phos 92 J68 115 pg/ml Folic acid 7.2IO/MB    RADIOGRAPHIC STUDIES: I have personally reviewed the radiological images as listed and agreed with the findings in the report. No results found.   ASSESSMENT & PLAN:  Lisa James is a 85 y.o. female with   1. Idiopathic Thrombocytopenia Purpura (ITP) -This was diagnosed in 2014. She has been treated with Nplate since 11/5972, last injection in 04/23/2018. To make it more convenient on her I switched her to oral Promacta 38m daily in 04/2018. Goal to keep plt >100.  -Due to her high risk for fall, I increased her Promacta to 755mdaily in 06/2018.  -She has been tolerating well although  she was temporary off due to frequent hospitalization.  -Her latest labs from Nursing home show PLT 125K in May 2020, no bleeding. Will continue Promacta at current dose.  [facility] will continue to manage her Promacta. I will refill today.  -f/u with another phone visit in 5-6 months  -Her overall condition has slowly deteriorated, she is most wheelchair-bound, will f/u with her PCP at nursing home   2. Decreased appetite  -Has lost weight since frequent hospitalizations.  -continue nutritional supplement.   3. Hypertension, DM  -She will follow with her primary care physician and cardiologist Dr. KeClaiborne BillingsShe will continue monitoring her blood pressure and blood glucose.   4. Falls -She has had frequent falls lately. Her niece thought it's related to her BP meds which was giving incorrectly when she moved to a memory unit a few months ago. -She now has to ambulate with assistance.   5. Dementia -[facility] where [facility].  -Managed by PCP, On medication  primary care physician has started her on medication for dementia, but both  -She appears to have progressed. She now has to ambulate with assistance and has to be spoon fed. She also leans to the side in natural sitting position.  -She remains to have adequate eating, BMs. She does not complain of pain.    PLAN: -Labs reviewed, continue Promacta at 7533mI refilled today  -[facility] CBC,every 3 months  - f/u with me in 6 months as virtual visit    No problem-specific Assessment & Plan notes found for this encounter.   No orders of the defined types were placed in this encounter.  I discussed the assessment and treatment plan with the patient. The patient was provided an opportunity to ask questions and all were answered. The patient agreed with the plan and demonstrated an understanding of the  instructions.  The patient was advised to call back or seek an in-person evaluation if the symptoms worsen or if the condition fails to improve as anticipated.  I provided 15 minutes of face-to-face video visit time during this encounter, and > 50% was spent counseling as  documented under my assessment & plan.    Truitt Merle, MD 01/15/2019   I, Joslyn Devon, am acting as scribe for Truitt Merle, MD.   I have reviewed the above documentation for accuracy and completeness, and I agree with the above.

## 2019-01-11 ENCOUNTER — Telehealth: Payer: Self-pay | Admitting: *Deleted

## 2019-01-11 NOTE — Telephone Encounter (Signed)
Received call from niece Laqueta Jean re:  Pt has office visit on Friday 01/15/19.  Per Anne Ng,  Pt resides at [facility] - which has  COVID +  At present. Anne Ng wanted to know if Dr. Burr Medico would do phone visit or any other measures. [facility]   Phone    [phone] [facility]     [phone].

## 2019-01-11 NOTE — Telephone Encounter (Signed)
Thu, could you call [place] to see if they can do lab CBC there or if she had one in the past month, If yes, please change her OV to phone visit (ideally her care giver can be with her when I call since she has dementia), thanks   Truitt Merle MD

## 2019-01-12 ENCOUNTER — Telehealth: Payer: Self-pay | Admitting: Hematology

## 2019-01-12 NOTE — Telephone Encounter (Signed)
Called patient regarding upcoming Webex appointment, spoke with patient's nurse and she will help patient get set up for Webex. E-mail has been sent.

## 2019-01-12 NOTE — Telephone Encounter (Signed)
Spoke with Ok Edwards, pt's nurse today at [facility].  Pt had labs done 12/01/18.  Ivery Quale direct fax number to nurse's desk.  Informed Ok Edwards that pt's visit will be changed to WebEx on Friday 01/15/19. [employer]   Phone    [phone]

## 2019-01-15 ENCOUNTER — Encounter: Payer: Self-pay | Admitting: Hematology

## 2019-01-15 ENCOUNTER — Other Ambulatory Visit: Payer: Medicare Other

## 2019-01-15 ENCOUNTER — Inpatient Hospital Stay: Payer: Medicare Other | Attending: Hematology | Admitting: Hematology

## 2019-01-15 DIAGNOSIS — F0390 Unspecified dementia without behavioral disturbance: Secondary | ICD-10-CM | POA: Diagnosis not present

## 2019-01-15 DIAGNOSIS — Z79899 Other long term (current) drug therapy: Secondary | ICD-10-CM | POA: Diagnosis not present

## 2019-01-15 DIAGNOSIS — D693 Immune thrombocytopenic purpura: Secondary | ICD-10-CM | POA: Diagnosis not present

## 2019-01-15 MED ORDER — ELTROMBOPAG OLAMINE 75 MG PO TABS: ORAL_TABLET | ORAL | 5 refills | Status: AC

## 2019-01-18 ENCOUNTER — Telehealth: Payer: Self-pay | Admitting: Hematology

## 2019-01-18 NOTE — Telephone Encounter (Signed)
Scheduled appt per 6/26 los. Spoke with patient niece and she gave me the number to the facility the patient is staying at.  I contacted the facility and they are aware of the patient appt date and time.

## 2019-03-12 ENCOUNTER — Other Ambulatory Visit: Payer: Self-pay

## 2019-03-12 ENCOUNTER — Non-Acute Institutional Stay: Payer: Medicare Other | Admitting: Internal Medicine

## 2019-03-12 VITALS — Ht 60.0 in | Wt 93.0 lb

## 2019-03-12 DIAGNOSIS — Z515 Encounter for palliative care: Secondary | ICD-10-CM

## 2019-03-12 NOTE — Progress Notes (Addendum)
Aug 21st, 2020 [facility] Palliative Care Consult Note Telephone: [phone]  Fax: [phone]  PATIENT NAME: Lisa James DOB: 10/21/1927 MRN: [id number] [address] Move in date: 02/10/2019  PRIMARY CARE PROVIDER:   Stefanie Libel FNP (Last Office Vist 12/15/2018)  Care Team: Truitt Merle ([facility]) (Last office visit 02/14/2019)  REFERRING PROVIDER:  Merrilee Seashore, [address] [place],  [place] [address]  RESPONSIBLE PARTY:   Niece Laqueta Jean [phone], [phone], [phone]  ASSESSMENT / RECOMMENDATIONS:  03/25/2019 Addendum:  Received completed MOST form, completed by patient' niece Laqueta Jean.  Details: DNR/DNI. Limited Scope of Medical Interventions. Yes to Antibiotics and to IVFs. No to Feeding Tube. Form up loaded into [facility] EPIC. I'll drop the original off to the facility to place onto patient's chart.  1. Advance Care Planning: A. Advance Care Directives: DNR (discussed and confirmed with niece today; will add to facility chart and upload into [facility] EMR). We discussed sections of the MOST in some detail (DNR/DNI. Limited scope of medical interventions (avoid ICU, no artificial ventilation). Yes to antibiotics and IVFs. No to tube feedings. I will mail the form to the niece for her signature, have her mail back to me, then I will place onto patient's chart and upload into [facility] EMR.   B. Goals of Care: Niece is hoping to transfer patient to skilled nursing at [facility], as the facility fees have increased here at [facility]. She is awaiting completion of FL2 at [facility] to proceed.  oct walking up and down staircase; hypotension resulted in fall. Niece wishes to move patient to [facility].  2. Cognitive / functional status: FAST 7a. Patient is oriented to self, but otherwise confused. Pleasantly conversant in short sentences. Social graces  intact. Follows commands. Maintains good eye contact and able to share her needs. Staff report she is awake only about 2-3 hours total/day. She is dependent for transfers, hygiene, dressing, and for feeding. She is wheelchair bound and leans to the side. She consumes average of 50% of her pureed diet. On Ensure supplements. Her weight (02/24/2019) was 93 lbs (from 95 lbs Feb 2020). At 5' her BMI is 18.6kg/m2. She has some UE skin tears and has developed R wrist / hand contractures. No c/o pain.   3. Family Supports:  Niece Anne Ng is PCG. I updated her with PC visit.   4. Follow up Palliative Care Visit: Friday Oct 2nd at 10am. F/U if Anne Ng has received MOST form in mail, signed, and returned back to me to place on chart.   I spent 60 minutes providing this consultation from 10:30am to 11:30am. More than 50% of the time in this consultation was spent coordinating communication.   HISTORY OF PRESENT ILLNESS:  Lisa James is an 85 y.o.  female with H/O Alzheimer's dementia, immune thrombocytopenic purpura (promacta; Hem Onc following. May 2020 plts 125), MI, aortic stenosis, CKD (May 2020 Creatinine 1.12) , chronic systolic heart failure, DM II (diet controlled), HTN, Non Hodgkin's lymphoma of the throat, subdural hematoma (Dr. Sherwood Gambler), peripheral artery disease, anxiety, major depressive disorder, insomnia, and protein calorie malnutrition. ED visit 09/02/2018 after a fall. Palliative Care was asked to help address goals of care.   CODE STATUS: DNR   PPS: 30%  HOSPICE ELIGIBILITY/DIAGNOSIS: TBD, pending further cognitive, functional decline.  PAST MEDICAL HISTORY:  Past Medical History:  Diagnosis Date  . Acute  MI, lateral wall ([place])   . Aortic stenosis   . Chronic kidney disease   . Chronic systolic heart failure ([place])   . Coronary artery disease    sees Dr Claiborne Billings every 6 months  . Dementia ([place])   . Diabetes ([place])   . DM (diabetes mellitus) ([place])    type 2 ; diet controlled  .  Heart disease   . HTN (hypertension)   . Immune thrombocytopenic purpura ([place]) 01/21/2013  . Ischemic heart disease   . Kidney disease   . Mitral regurgitation   . Non Hodgkin's lymphoma ([place])    of the throat  . Subdural hematoma, post-traumatic ([place]) 2015   sees Dr Sherwood Gambler every 6 months  . Thrombocytopenia ([place])    sees Dr Marin Olp every 2 weeks ;receives Nplate prn    SOCIAL HX:  Social History   Tobacco Use  . Smoking status: Never Smoker  . Smokeless tobacco: Never Used  . Tobacco comment: never used tobacco.  Substance Use Topics  . Alcohol use: No    Alcohol/week: 0.0 standard drinks    ALLERGIES:  Allergies  Allergen Reactions  . Ace Inhibitors Cough     PERTINENT MEDICATIONS:  Outpatient Encounter Medications as of 03/12/2019  Medication Sig  . atorvastatin (LIPITOR) 20 MG tablet TAKE 1 TABLET (20 MG TOTAL) BY MOUTH DAILY. (Patient taking differently: Take 20 mg by mouth at bedtime. )  . Calcium Carbonate-Vitamin D (CALCIUM-VITAMIN D) 500-200 MG-UNIT per tablet Take 1 tablet by mouth daily.   . Carboxymethylcellul-Glycerin (REFRESH OPTIVE) 1-0.9 % GEL Place 2 drops into both eyes 3 (three) times daily.  . carvedilol (COREG) 6.25 MG tablet Take 1 tablet (6.25 mg total) by mouth 2 (two) times daily with a meal.  . collagenase (SANTYL) ointment Apply topically daily. Apply Santyl to left ischium, right buttock Q day, then cover with moist 2X2 and foam dressing.  (Change foam dressing Q 3 days or PRN soiling.)  . eltrombopag (PROMACTA) 75 MG tablet TAKE 1 TABLET (75 MG TOTAL) BY MOUTH DAILY. TAKE ON AN EMPTY STOMACH 1 HOUR BEFORE MEALS OR 2 HOURS AFTER.  Marland Kitchen HYDROcodone-acetaminophen (NORCO/VICODIN) 5-325 MG tablet Take 1 tablet by mouth every 6 (six) hours as needed (for pain).  Marland Kitchen LACTOBACILLUS PO Take 2 capsules by mouth 3 (three) times daily.  . megestrol (MEGACE) 40 MG/ML suspension Take 400 mg by mouth 2 (two) times daily.  . methocarbamol (ROBAXIN) 500 MG tablet Take  1 tablet (500 mg total) by mouth every 6 (six) hours as needed for muscle spasms.  . Multiple Vitamins-Minerals (PRESERVISION AREDS PO) Take 1 tablet by mouth daily.  . sertraline (ZOLOFT) 25 MG tablet Take 25 mg by mouth daily.  Marland Kitchen talc (ZEASORB) powder Apply 1 application topically every 12 (twelve) hours as needed (UNDER BREASTS, for irritation).  . traZODone (DESYREL) 50 MG tablet Take 50 mg by mouth at bedtime.    No facility-administered encounter medications on file as of 03/12/2019.     PHYSICAL EXAM:   Frail appearing elderly female lying on her R side in bed. She was sleeping but awoke easily to my voice.  Answers simple questions; follows simple commands. Able to state her name.     Cardiovascular: Systolic murmur Pulmonary: clear ant fields Abdomen: soft, nontender, + bowel sounds GU: no suprapubic tenderness Extremities: L hand/wrist/fingers contracture. Good R UE grip; able to straighten out legs to request. Mild bilat LE swelling to low calf slightly pitting. Bilat UE skin bruising/tears.  Neurological: Generalized weakness but otherwise non-focal.  Julianne Handler, NP

## 2019-03-15 ENCOUNTER — Encounter: Payer: Self-pay | Admitting: Internal Medicine

## 2019-03-17 ENCOUNTER — Encounter: Payer: Self-pay | Admitting: Internal Medicine

## 2019-03-25 ENCOUNTER — Telehealth: Payer: Self-pay | Admitting: Internal Medicine

## 2019-03-25 NOTE — Telephone Encounter (Signed)
03/25/2019 Received completed MOST form, completed by patient' niece Laqueta Jean.  Details: DNR/DNI. Limited Scope of Medical Interventions. Yes to Antibiotics and to IVFs. No to Feeding Tube. Form up loaded into [facility] EPIC.  Violeta Gelinas NP-C [place] [phone]

## 2019-03-26 ENCOUNTER — Telehealth: Payer: Self-pay | Admitting: Internal Medicine

## 2019-03-26 ENCOUNTER — Encounter: Payer: Self-pay | Admitting: Internal Medicine

## 2019-03-26 ENCOUNTER — Other Ambulatory Visit: Payer: Self-pay

## 2019-03-26 ENCOUNTER — Non-Acute Institutional Stay: Payer: Medicare Other | Admitting: Internal Medicine

## 2019-03-26 DIAGNOSIS — Z515 Encounter for palliative care: Secondary | ICD-10-CM

## 2019-03-26 NOTE — Progress Notes (Signed)
Sept 4th, 2020 [facility] Palliative Care Consult Note Telephone: [phone]  Fax: [phone]   Due to the current COVID-19 infection/crises, the family prefer, and have given their verbal consent for, a provider visit via telemedicine. HIPPA policies of confidentially were discussed. Video-audio (telehealth) contact was unable to be done due technical barriers from the patient's side.  PATIENT NAME: Lisa James DOB: 1928/04/03 MRN: [id number] [address] Move in date: 02/10/2019   PRIMARY CARE PROVIDER:   Stefanie Libel FNP (Last Office Vist 12/15/2018)   Care Team: Truitt Merle ([facility]) (Last office visit 02/14/2019)   REFERRING PROVIDER:  Merrilee Seashore, [address] [place],  [place] [address]   RESPONSIBLE PARTY:   Niece Laqueta Jean [phone], (W) [phone], [phone]   ASSESSMENT / RECOMMENDATIONS:  1.Advance Care Planning:  A.Goals of Care: Discussion with HCPOA Niece Anne Ng. We discussed patient's continuing functional and cognitive decline, and that patient would be hospice eligible. Annette requests referral. I left a message with [place] for a call back, to discuss. Anne Ng no longer is looking to transfer patient out of [facility] as the facility fees have not increased to the extent that was anticipated and so continues to be affordable.  B. Advance Care Directives: DNR. Yesterday I had received the completed MOST form, completed by patient' niece Laqueta Jean. Details: DNR/DNI. Limited Scope of Medical Interventions. Yes to Antibiotics and to IVFs. No to Feeding Tube. I'll drop the original off to the facility to place onto patient's chart, today.    2. Cognitive / functional status: (per nsg staff report) FAST 7a. Patient is constantly confused; oriented to self only. She follows simple commands, maintains eye contact, and is conversant  in short sentences. She is awake only 2-3 hours / day. She is dependent for transfers, hygiene, dressing, and for feeding. She is bedbound. When transferred to the wheelchair she leans to the side. She consumes average of 50% of her pureed diet. On Ensure supplements. Her most recent weight (02/24/2019) was 93 lbs (from 95 lbs Feb 2020). At 5' her BMI is 18.6kg/m2. She has UE skin tears and has developed R wrist / hand contractures. No c/o pain.    3. Family Supports:  Niece Anne Ng is PCG. I updated her with PC visit.    4. Follow up Palliative Care Visit: Referral to hospice underway.  I spent 60 minutes providing this consultation from 10am to 11am. More than 50% of the time in this consultation was spent coordinating communication.    HISTORY OF PRESENT ILLNESS:  Lisa James is an 85 y.o.  female with H/O Alzheimer's dementia, immune thrombocytopenic purpura (promacta; Hem Onc following. May 2020 plts 125), MI, aortic stenosis, CKD (May 2020 Creatinine 1.12) , chronic systolic heart failure, DM II (diet controlled), HTN, Non Hodgkin's lymphoma of the throat, subdural hematoma (Dr. Sherwood Gambler), peripheral artery disease, anxiety, major depressive disorder, insomnia, and protein calorie malnutrition. ED visit 09/02/2018 after a fall. This is a f/u Palliative Care visit from 04/16/2019 for a goals of care discussion with patient's HCPOA niece Laqueta Jean.   CODE STATUS: DNR. MOST on chart/[facility] EP[facility]   PPS: 30%   HOSPICE ELIGIBILITY/DIAGNOSIS: Yes / terminal Alzheimer's dementia and Protein Calorie Malnutrition/Failure to Thrive  PAST MEDICAL HISTORY:  Past Medical History:  Diagnosis Date  . Acute MI, lateral wall ([place])   . Aortic stenosis   .  Chronic kidney disease   . Chronic systolic heart failure ([place])   . Coronary artery disease    sees Dr Claiborne Billings every 6 months  . Dementia ([place])   . Diabetes ([place])   . DM (diabetes mellitus) ([place])    type 2 ; diet controlled  . Heart disease   .  HTN (hypertension)   . Immune thrombocytopenic purpura ([place]) 01/21/2013  . Ischemic heart disease   . Kidney disease   . Mitral regurgitation   . Non Hodgkin's lymphoma ([place])    of the throat  . Subdural hematoma, post-traumatic ([place]) 2015   sees Dr Sherwood Gambler every 6 months  . Thrombocytopenia ([place])    sees Dr Marin Olp every 2 weeks ;receives Nplate prn    SOCIAL HX:  Social History   Tobacco Use  . Smoking status: Never Smoker  . Smokeless tobacco: Never Used  . Tobacco comment: never used tobacco.  Substance Use Topics  . Alcohol use: No    Alcohol/week: 0.0 standard drinks    ALLERGIES:  Allergies  Allergen Reactions  . Ace Inhibitors Cough     PERTINENT MEDICATIONS:  Outpatient Encounter Medications as of 03/26/2019  Medication Sig  . atorvastatin (LIPITOR) 20 MG tablet TAKE 1 TABLET (20 MG TOTAL) BY MOUTH DAILY. (Patient taking differently: Take 20 mg by mouth at bedtime. )  . Calcium Carbonate-Vitamin D (CALCIUM-VITAMIN D) 500-200 MG-UNIT per tablet Take 1 tablet by mouth daily.   . Carboxymethylcellul-Glycerin (REFRESH OPTIVE) 1-0.9 % GEL Place 2 drops into both eyes 3 (three) times daily.  . carvedilol (COREG) 6.25 MG tablet Take 1 tablet (6.25 mg total) by mouth 2 (two) times daily with a meal.  . collagenase (SANTYL) ointment Apply topically daily. Apply Santyl to left ischium, right buttock Q day, then cover with moist 2X2 and foam dressing.  (Change foam dressing Q 3 days or PRN soiling.)  . eltrombopag (PROMACTA) 75 MG tablet TAKE 1 TABLET (75 MG TOTAL) BY MOUTH DAILY. TAKE ON AN EMPTY STOMACH 1 HOUR BEFORE MEALS OR 2 HOURS AFTER.  Marland Kitchen HYDROcodone-acetaminophen (NORCO/VICODIN) 5-325 MG tablet Take 1 tablet by mouth every 6 (six) hours as needed (for pain).  Marland Kitchen LACTOBACILLUS PO Take 2 capsules by mouth 3 (three) times daily.  . megestrol (MEGACE) 40 MG/ML suspension Take 400 mg by mouth 2 (two) times daily.  . methocarbamol (ROBAXIN) 500 MG tablet Take 1 tablet (500 mg  total) by mouth every 6 (six) hours as needed for muscle spasms.  . Multiple Vitamins-Minerals (PRESERVISION AREDS PO) Take 1 tablet by mouth daily.  . sertraline (ZOLOFT) 25 MG tablet Take 25 mg by mouth daily.  Marland Kitchen talc (ZEASORB) powder Apply 1 application topically every 12 (twelve) hours as needed (UNDER BREASTS, for irritation).  . traZODone (DESYREL) 50 MG tablet Take 50 mg by mouth at bedtime.    No facility-administered encounter medications on file as of 03/26/2019.     PHYSICAL EXAM:   Deferred d/t telehealth audio only nature of visit  Julianne Handler, NP

## 2019-03-26 NOTE — Telephone Encounter (Signed)
Note in error Lisa Gelinas NP-C [facility]  [phone]

## 2019-04-23 ENCOUNTER — Encounter: Payer: Self-pay | Admitting: Internal Medicine

## 2019-04-23 ENCOUNTER — Non-Acute Institutional Stay: Payer: Medicare Other | Admitting: Internal Medicine

## 2019-04-23 ENCOUNTER — Other Ambulatory Visit: Payer: Self-pay

## 2019-04-23 DIAGNOSIS — Z515 Encounter for palliative care: Secondary | ICD-10-CM

## 2019-04-23 NOTE — Progress Notes (Signed)
Oct 2nd, 2020 [facility] Palliative Care Consult Note Telephone: [phone]  Fax: [phone]   Due to the current COVID-19 infection/crises, the family prefer, and have given their verbal consent for, a provider visit via telemedicine. HIPPA policies of confidentially were discussed. Video-audio (telehealth) contact was unable to be done due technical barriers from the patient's side.   PATIENT NAME: Lisa James DOB: [birth year] MRN: [id number] [address] Move in date: 02/10/2019   PRIMARY CARE PROVIDER:   Stefanie Libel FNP (Last Office Vist 12/15/2018)   Care Team: Lisa James ([facility]) (Last office visit 02/14/2019)   REFERRING PROVIDER:  Merrilee James, [place],   [address]   RESPONSIBLE PARTY:   Niece Lisa James [phone], (W) [phone], [phone]   ASSESSMENT / RECOMMENDATIONS:  1.Advance Care Planning:   A.Goals of Care: Patient had recently been evaluated for hospice but HCPOA/niece Lisa James deferred as patient's treatment medication for ITP (Promacta) would have been discontinued under [facility]. Lisa James's hope is for patient to continue safe, happy, and be well cared for.    B. Advance Care Directives: DNR and MOST form present on the chart. Details: DNR/DNI. Limited Scope of Medical Interventions. Yes to Antibiotics and to IVFs. No to Feeding Tube.    2. Cognitive / functional status: FAST 7a. Patient is constantly confused; oriented to self only. She was able to tell me her name. She follows simple commands, maintains eye contact, and is conversant in short sentences. She is less chatty for me this evening, but her niece mentions that she tends to sundown at night. She is awake only 4 hours / day. She is dependent for transfers, hygiene, dressing, and for feeding. She is bedbound. When transferred to the wheelchair she leans to the side. Episodic RUE  dependent edema improved with elevation. She has UE skin tears. She has bilateral wrist / hand contractures R > L. R elbow contracture. She is developing LE contractures; Limited leg extension to 160 degrees. No c/o pain. She consumes average of 50% of her pureed diet. Her current wight is 84lb, At 5' her BMI is 16.4 kg/m2. She has been prescribed Polymyxin/Rimeth opth solution for R lid conjunctivitis. Her bottom eye lid is only slightly reddened now.   3. Family Supports: Niece Lisa James is PCG. I called her and updated with today's PC visit.    4. Follow up Palliative Care Visit: December 18th, 2020   I spent 60 minutes providing this consultation from 10am to 11am. More than 50% of the time in this consultation was spent coordinating communication.    HISTORY OF PRESENT ILLNESS:  Lisa James is an 85 y.o.  female with H/O Alzheimer's dementia, immune thrombocytopenic purpura (promacta; Hem Onc following. May 2020 plts 125), MI, aortic stenosis, CKD (May 2020 Creatinine 1.12) , chronic systolic heart failure, DM II (diet controlled), HTN, Non Hodgkin's lymphoma of the throat, subdural hematoma (Dr. Sherwood James), peripheral artery disease, anxiety, major depressive disorder, insomnia, and protein calorie malnutrition. ED visit 09/02/2018 after a fall. This is a f/u Palliative Care visit from 03/26/2019.   CODE STATUS: DNR. MOST on chart/[facility] EPIC   PPS: 30%   HOSPICE ELIGIBILITY/DIAGNOSIS: Yes / terminal Alzheimer's dementia and Protein Calorie Malnutrition/Failure to Thrive  PAST MEDICAL HISTORY:  Past Medical History:  Diagnosis Date  . Acute MI, lateral wall ([place])   . Aortic stenosis   . Chronic kidney disease   .  Chronic systolic heart failure ([place])   . Coronary artery disease    sees Dr Lisa James every 6 months  . Dementia ([place])   . Diabetes ([place])   . DM (diabetes mellitus) ([place])    type 2 ; diet controlled  . Heart disease   . HTN (hypertension)   . Immune thrombocytopenic purpura  ([place]) 01/21/2013  . Ischemic heart disease   . Kidney disease   . Mitral regurgitation   . Non Hodgkin's lymphoma ([place])    of the throat  . Subdural hematoma, post-traumatic ([place]) 2015   sees Dr Lisa James every 6 months  . Thrombocytopenia ([place])    sees Dr Lisa James every 2 weeks ;receives Nplate prn    SOCIAL HX:  Social History   Tobacco Use  . Smoking status: Never Smoker  . Smokeless tobacco: Never Used  . Tobacco comment: never used tobacco.  Substance Use Topics  . Alcohol use: No    Alcohol/week: 0.0 standard drinks    ALLERGIES:  Allergies  Allergen Reactions  . Ace Inhibitors Cough     PERTINENT MEDICATIONS:  Outpatient Encounter Medications as of 04/23/2019  Medication Sig  . atorvastatin (LIPITOR) 20 MG tablet TAKE 1 TABLET (20 MG TOTAL) BY MOUTH DAILY. (Patient taking differently: Take 20 mg by mouth at bedtime. )  . Calcium Carbonate-Vitamin D (CALCIUM-VITAMIN D) 500-200 MG-UNIT per tablet Take 1 tablet by mouth daily.   . Carboxymethylcellul-Glycerin (REFRESH OPTIVE) 1-0.9 % GEL Place 2 drops into both eyes 3 (three) times daily.  . carvedilol (COREG) 6.25 MG tablet Take 1 tablet (6.25 mg total) by mouth 2 (two) times daily with a meal. (Patient taking differently: Take 3.125 mg by mouth 2 (two) times daily with a meal. Patient taking differently; 3.125mg  bid)  . collagenase (SANTYL) ointment Apply topically daily. Apply Santyl to left ischium, right buttock Q day, then cover with moist 2X2 and foam dressing.  (Change foam dressing Q 3 days or PRN soiling.)  . eltrombopag (PROMACTA) 75 MG tablet TAKE 1 TABLET (75 MG TOTAL) BY MOUTH DAILY. TAKE ON AN EMPTY STOMACH 1 HOUR BEFORE MEALS OR 2 HOURS AFTER.  Marland Kitchen HYDROcodone-acetaminophen (NORCO/VICODIN) 5-325 MG tablet Take 1 tablet by mouth every 6 (six) hours as needed (for pain).  Marland Kitchen LACTOBACILLUS PO Take 2 capsules by mouth 3 (three) times daily.  Marland Kitchen levothyroxine (SYNTHROID) 25 MCG tablet Take 25 mcg by mouth daily before  breakfast.  . methocarbamol (ROBAXIN) 500 MG tablet Take 1 tablet (500 mg total) by mouth every 6 (six) hours as needed for muscle spasms.  . Multiple Vitamins-Minerals (PRESERVISION AREDS PO) Take 1 tablet by mouth daily.  . sertraline (ZOLOFT) 25 MG tablet Take 25 mg by mouth daily.  Marland Kitchen spironolactone (ALDACTONE) 25 MG tablet Take 25 mg by mouth daily.  Marland Kitchen talc (ZEASORB) powder Apply 1 application topically every 12 (twelve) hours as needed (UNDER BREASTS, for irritation).  . traZODone (DESYREL) 50 MG tablet Take 50 mg by mouth at bedtime.   . [DISCONTINUED] megestrol (MEGACE) 40 MG/ML suspension Take 400 mg by mouth 2 (two) times daily.   No facility-administered encounter medications on file as of 04/23/2019.     PHYSICAL EXAM:   Very thin,  frail appearing, elderly female slouched down in her wheelchair in the common area. Bright and pleasant affect. Conversant in 1-2 word phrases. Denies pain.  Cardiovascular: regular rate and rhythm Pulmonary: clear ant fields Abdomen: soft, nontender, + bowel sounds GU: no suprapubic tenderness Extremities: no edema, mild  knee contractures; R elbow contractures. Bilateral wrist, and finger contractures R>L. Skin: no rashes Neurological: Weakness but otherwise nonfocal  Julianne Handler, NP

## 2019-05-13 ENCOUNTER — Telehealth: Payer: Self-pay

## 2019-05-13 NOTE — Telephone Encounter (Signed)
Oral Oncology Patient Advocate Encounter  Promacta patient assistance with [employer] will expire 07/22/19.  I called the patients Niece, Anne Ng and went over this information with her. She requested that I email the patients signature portion to her.  I emailed the signauture portion, she signed it and emailed it back to me.  This renewal application is ready to be faxed to [employer] when the renewal period starts on Nov. 16th.  Oral oncology clinic will continue to follow.  [place] [facility] [place] Phone [phone] Fax [phone] 05/13/2019   10:52 AM

## 2019-06-28 ENCOUNTER — Telehealth: Payer: Self-pay | Admitting: Hematology

## 2019-06-28 NOTE — Telephone Encounter (Signed)
I called Annette back and left her a VM: ok to stop promacta at this point. I encouraged her to call us back if she has further questions or anything else we can help her.    Truitt Merle MD

## 2019-06-28 NOTE — Telephone Encounter (Signed)
YF PAL 12/23. Called patient re rescheduling and spoke with niece, Anne Ng ([place]).   Per Anne Ng patient is in assisted living very fragile and hospice has been called in. Per Niece patient cannot come out for appointment or perform virtual. Per Niece patient can no longer take medicine and is fading away. She would like Dr. Ernestina Penna take on the situation and is requesting a call back from Dr. Burr Medico.   Appointment cancelled per Niece. Message to Dr. Burr Medico re calling Niece.

## 2019-07-05 ENCOUNTER — Emergency Department (HOSPITAL_COMMUNITY): Payer: Medicare Other

## 2019-07-05 ENCOUNTER — Other Ambulatory Visit: Payer: Self-pay

## 2019-07-05 ENCOUNTER — Encounter (HOSPITAL_COMMUNITY): Payer: Self-pay | Admitting: Emergency Medicine

## 2019-07-05 ENCOUNTER — Emergency Department (HOSPITAL_COMMUNITY)
Admission: EM | Admit: 2019-07-05 | Discharge: 2019-07-05 | Disposition: A | Discharge status: Skilled Nursing Facility | Payer: Medicare Other | Attending: Emergency Medicine | Admitting: Emergency Medicine

## 2019-07-05 DIAGNOSIS — L89329 Pressure ulcer of left buttock, unspecified stage: Secondary | ICD-10-CM | POA: Diagnosis not present

## 2019-07-05 DIAGNOSIS — I130 Hypertensive heart and chronic kidney disease with heart failure and stage 1 through stage 4 chronic kidney disease, or unspecified chronic kidney disease: Secondary | ICD-10-CM | POA: Diagnosis not present

## 2019-07-05 DIAGNOSIS — Z66 Do not resuscitate: Secondary | ICD-10-CM | POA: Diagnosis not present

## 2019-07-05 DIAGNOSIS — R0600 Dyspnea, unspecified: Secondary | ICD-10-CM | POA: Diagnosis not present

## 2019-07-05 DIAGNOSIS — F0390 Unspecified dementia without behavioral disturbance: Secondary | ICD-10-CM | POA: Insufficient documentation

## 2019-07-05 DIAGNOSIS — N183 Chronic kidney disease, stage 3 unspecified: Secondary | ICD-10-CM | POA: Diagnosis not present

## 2019-07-05 DIAGNOSIS — U071 COVID-19: Secondary | ICD-10-CM | POA: Insufficient documentation

## 2019-07-05 DIAGNOSIS — I5042 Chronic combined systolic (congestive) and diastolic (congestive) heart failure: Secondary | ICD-10-CM | POA: Diagnosis not present

## 2019-07-05 DIAGNOSIS — E1122 Type 2 diabetes mellitus with diabetic chronic kidney disease: Secondary | ICD-10-CM | POA: Insufficient documentation

## 2019-07-05 DIAGNOSIS — Z79899 Other long term (current) drug therapy: Secondary | ICD-10-CM | POA: Insufficient documentation

## 2019-07-05 DIAGNOSIS — R464 Slowness and poor responsiveness: Secondary | ICD-10-CM | POA: Diagnosis present

## 2019-07-05 LAB — CBC WITH DIFFERENTIAL/PLATELET
Abs Immature Granulocytes: 0.32 10*3/uL — ABNORMAL HIGH (ref 0.00–0.07)
Basophils Absolute: 0.1 10*3/uL (ref 0.0–0.1)
Basophils Relative: 0 %
Eosinophils Absolute: 0 10*3/uL (ref 0.0–0.5)
Eosinophils Relative: 0 %
HCT: 35.1 % — ABNORMAL LOW (ref 36.0–46.0)
Hemoglobin: 11 g/dL — ABNORMAL LOW (ref 12.0–15.0)
Immature Granulocytes: 2 %
Lymphocytes Relative: 7 %
Lymphs Abs: 1.1 10*3/uL (ref 0.7–4.0)
MCH: 33 pg (ref 26.0–34.0)
MCHC: 31.3 g/dL (ref 30.0–36.0)
MCV: 105.4 fL — ABNORMAL HIGH (ref 80.0–100.0)
Monocytes Absolute: 0.5 10*3/uL (ref 0.1–1.0)
Monocytes Relative: 3 %
Neutro Abs: 13.8 10*3/uL — ABNORMAL HIGH (ref 1.7–7.7)
Neutrophils Relative %: 88 %
Platelets: 50 10*3/uL — ABNORMAL LOW (ref 150–400)
RBC: 3.33 MIL/uL — ABNORMAL LOW (ref 3.87–5.11)
RDW: 14.4 % (ref 11.5–15.5)
WBC: 15.8 10*3/uL — ABNORMAL HIGH (ref 4.0–10.5)
nRBC: 0.1 % (ref 0.0–0.2)

## 2019-07-05 LAB — RESPIRATORY PANEL BY RT PCR (FLU A&B, COVID)
Influenza A by PCR: NEGATIVE
Influenza B by PCR: NEGATIVE
SARS Coronavirus 2 by RT PCR: POSITIVE — AB

## 2019-07-05 MED ORDER — SODIUM CHLORIDE 0.9 % IV SOLN
INTRAVENOUS | Status: DC
  Administered 2019-07-05: 15:00:00 via INTRAVENOUS

## 2019-07-05 MED ORDER — MORPHINE SULFATE (CONCENTRATE) 10 MG /0.5 ML PO SOLN: 10.0000 mg | ORAL | 0 refills | Status: AC | PRN

## 2019-07-05 MED ORDER — MORPHINE SULFATE (CONCENTRATE) 10 MG /0.5 ML PO SOLN: 10.0000 mg | Freq: Three times a day (TID) | ORAL | 0 refills | Status: AC

## 2019-07-05 NOTE — ED Provider Notes (Addendum)
[facility] DEPT Provider Note   CSN: [id number] Arrival date & time: 07/04/2019  1255     History Chief Complaint  Patient presents with  . COvid+  . Respiratory Distress    DEZIRAE SERVICE is a 85 y.o. female.  HPI    Patient presents from nursing facility with concern for unresponsiveness. Patient with level 5 caveat secondary to dementia, and unresponsiveness.  Per report, the patient has minimal activity at baseline, is minimally interactive, sleeps most of the day.  She was recently diagnosed with Covid, and has recently had hypotension. Seemingly the patient may have been unresponsive for as long as the past 2 days before being transferred here for evaluation. History is provided by nursing home notes, and I spoke with her niece on the telephone. Niece confirms the patient is DNR, comfort care. Past Medical History:  Diagnosis Date  . Acute MI, lateral wall ([place])   . Aortic stenosis   . Chronic kidney disease   . Chronic systolic heart failure ([place])   . Coronary artery disease    sees Dr Claiborne Billings every 6 months  . Dementia ([place])   . Diabetes ([place])   . DM (diabetes mellitus) ([place])    type 2 ; diet controlled  . Heart disease   . HTN (hypertension)   . 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 Ischemic heart disease   . Kidney disease   . Mitral regurgitation   . Non Hodgkin's lymphoma ([place])    of the throat  . Subdural hematoma, post-traumatic ([name]) 2015   sees Dr Sherwood Gambler every 6 months  . Thrombocytopenia ([place])    sees Dr Marin Olp every 2 weeks ;receives Nplate prn    Patient Active Problem List   Diagnosis Date Noted  . Cellulitis 08/15/2018  . Cellulitis of right foot 08/14/2018  . Protein-calorie malnutrition, severe 07/13/2018  . Closed right hip fracture, initial encounter ([place]) 07/12/2018  . Acute on chronic anemia 06/05/2018  . Chronic diastolic CHF (congestive heart failure) ([place]) 06/05/2018  . Closed  fracture of right proximal humerus 05/30/2018  . Proximal humerus fracture 05/28/2018  . Pressure injury of skin 05/15/2018  . Fall 05/14/2018  . Closed comminuted right humeral fracture 05/14/2018  . UTI (urinary tract infection) 05/14/2018  . Hyponatremia 05/14/2018  . Leucocytosis 05/14/2018  . Altered mental status 05/13/2015  . Idiopathic thrombocytopenic purpura ([place]) 07/06/2013  . Aortic valve stenosis 06/19/2013  . 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 GERD (gastroesophageal reflux disease) 12/17/2012  . Type II or unspecified type diabetes mellitus with renal manifestations, not stated as uncontrolled(250.40) 12/10/2012  . Chronic kidney disease (CKD), stage III (moderate) ([place]) 11/22/2012  . Diabetes mellitus ([place]) 07/03/2011  . Depression 07/03/2011  . Non-Hodgkin's lymphoma ([place]) 07/03/2011  . CAD (coronary artery disease) 07/03/2011  . PAD (peripheral artery disease) ([place]) 07/03/2011  . Vitamin D deficiency 07/03/2011  . Subdural hematoma ([place]) 07/03/2011  . Confusion 05/12/2011  . Subdural hematoma, acute ([place]) 05/12/2011  . HYPERLIPIDEMIA TYPE IIB / III 05/10/2009  . OLD MYOCARDIAL INFARCTION 05/10/2009  . Occlusion and stenosis of carotid artery without mention of cerebral infarction 05/10/2009  . HYPERTENSION, BENIGN 11/03/2008  . MITRAL REGURGITATION 10/29/2008  . ISCHEMIC HEART DISEASE 10/29/2008  . SYSTOLIC HEART FAILURE, CHRONIC 10/29/2008    Past Surgical History:  Procedure Laterality Date  . BONE MARROW BIOPSY  11/22/2002   left posterior iliac crest bone marrow biopsy and aspirate  . CATARACT EXTRACTION W/ INTRAOCULAR LENS  IMPLANT, BILATERAL Bilateral   . CORONARY ARTERY BYPASS GRAFT  11/20/2007   x5  . EYE SURGERY    . ORIF HUMERUS FRACTURE Right 05/28/2018   Procedure: RIGHT OPEN REDUCTION INTERNAL FIXATION (ORIF) PROXIMAL HUMERUS FRACTURE;  Surgeon: Meredith Pel, MD;  Location: [facility];  Service: Orthopedics;  Laterality: Right;  .  submental lymph node excisional biopsy  10/22/2002     OB History   No obstetric history on file.     Family History  Problem Relation Age of Onset  . Heart attack Father   . Diabetes Father     Social History   Tobacco Use  . Smoking status: Never Smoker  . Smokeless tobacco: Never Used  . Tobacco comment: never used tobacco.  Substance Use Topics  . Alcohol use: No    Alcohol/week: 0.0 standard drinks  . Drug use: No    Home Medications Prior to Admission medications   Medication Sig Start Date End Date Taking? Authorizing Provider  atorvastatin (LIPITOR) 20 MG tablet TAKE 1 TABLET (20 MG TOTAL) BY MOUTH DAILY. Patient taking differently: Take 20 mg by mouth at bedtime.  01/29/16   Darlyne Russian, MD  Calcium Carbonate-Vitamin D (CALCIUM-VITAMIN D) 500-200 MG-UNIT per tablet Take 1 tablet by mouth daily.     [provider]  Carboxymethylcellul-Glycerin (REFRESH OPTIVE) 1-0.9 % GEL Place 2 drops into both eyes 3 (three) times daily.    [provider]  carvedilol (COREG) 6.25 MG tablet Take 1 tablet (6.25 mg total) by mouth 2 (two) times daily with a meal. Patient taking differently: Take 3.125 mg by mouth 2 (two) times daily with a meal. Patient taking differently; 3.141m bid 07/28/15   DDarlyne Russian MD  collagenase (SANTYL) ointment Apply topically daily. Apply Santyl to left ischium, right buttock Q day, then cover with moist 2X2 and foam dressing.  (Change foam dressing Q 3 days or PRN soiling.) 08/18/18   NMariel Aloe MD  eltrombopag (PROMACTA) 75 MG tablet TAKE 1 TABLET (75 MG TOTAL) BY MOUTH DAILY. TAKE ON AN EMPTY STOMACH 1 HOUR BEFORE MEALS OR 2 HOURS AFTER. 01/15/19   FTruitt Merle MD  HYDROcodone-acetaminophen (NORCO/VICODIN) 5-325 MG tablet Take 1 tablet by mouth every 6 (six) hours as needed (for pain). 08/17/18   NMariel Aloe MD  LACTOBACILLUS PO Take 2 capsules by mouth 3 (three) times daily.    [provider]  levothyroxine  (SYNTHROID) 25 MCG tablet Take 25 mcg by mouth daily before breakfast.    [provider]  methocarbamol (ROBAXIN) 500 MG tablet Take 1 tablet (500 mg total) by mouth every 6 (six) hours as needed for muscle spasms. 05/29/18   DMeredith Pel MD  Multiple Vitamins-Minerals (PRESERVISION AREDS PO) Take 1 tablet by mouth daily.    [provider]  sertraline (ZOLOFT) 25 MG tablet Take 25 mg by mouth daily.    [provider]  spironolactone (ALDACTONE) 25 MG tablet Take 25 mg by mouth daily.    [provider]  talc (ZEASORB) powder Apply 1 application topically every 12 (twelve) hours as needed (UNDER BREASTS, for irritation).    [provider]  traZODone (DESYREL) 50 MG tablet Take 50 mg by mouth at bedtime.  06/01/18 08/14/26  [provider]    Allergies    Ace inhibitors  Review of Systems   Review of Systems  Unable to perform ROS: Dementia    Physical Exam Updated Vital Signs LMP  (  LMP Unknown)   Physical Exam Vitals and nursing note reviewed.  Constitutional:      General: She is not in acute distress.    Appearance: She is well-developed. She is ill-appearing.     Comments: Cachectic elderly female in right lateral decubitus position noninteractive, emaciated  HENT:     Head: Normocephalic and atraumatic.  Eyes:     Conjunctiva/sclera: Conjunctivae normal.  Pulmonary:     Effort: Pulmonary effort is normal. No respiratory distress.     Breath sounds: No stridor.  Abdominal:     General: There is no distension.  Musculoskeletal:        General: No deformity or signs of injury.  Skin:    Coloration: Skin is pale.       Neurological:     Comments: Does not interact, does not move substantially spontaneously, though she does move all extremities minimally. Does not follow commands.  Psychiatric:        Cognition and Memory: Cognition is impaired.     ED Results / Procedures / Treatments   Labs (all labs  ordered are listed, but only abnormal results are displayed) Labs Reviewed - No data to display   Radiology DG Chest [facility] 1 View  Result Date: 07/13/2019 CLINICAL DATA:  Dyspnea, COVID positive EXAM: PORTABLE CHEST 1 VIEW COMPARISON:  Radiograph 08/14/2018 FINDINGS: Patient is rotated in a steep right anterior obliquity relative to the [place] superimposing much of the mediastinal structures over the left chest and narrowing the right lung window. There is some increased opacity in the left lung base of this is largely projectional. No clear consolidative process is seen. No visible pneumothorax or effusion though projection markedly limits evaluation. Postsurgical changes related to prior CABG including sternotomy wires and multiple surgical clips projecting over the mediastinum. The lowest sternal suture is fractured, similar to comparison is. The osseous structures appear diffusely demineralized which may limit detection of small or nondisplaced fractures. Prior right shoulder plate and screw fixation construct with grossly intact hardware. IMPRESSION: 1. No evidence of acute cardiopulmonary process though evaluation is limited by a steep right anterior oblique projection. Electronically Signed   By: Lovena Le M.D.   On: 07/06/2019 15:02    Medications Ordered in ED Medications - No data to display  ED Course  I have reviewed the triage vital signs and the nursing notes.  Pertinent labs & imaging results that were available during my care of the patient were reviewed by me and considered in my medical decision making (see chart for details).    MDM Rules/Calculators/A&P                      After the initial evaluation I reviewed the patient's presentation with her niece, power of attorney, who confirms goals of care. With notation of the patient's dementia, DNR status, Covid infection, the patient will receive comfort measures.  I discussed the patient's presentation with our onsite  palliative care team, and subsequently with the patient's hospice coordinators.  Hospice coordinator notes that the patient can return back to her facility for end-of-life care, coordinated by them. Here the patient is in similar condition as on arrival, no distress, but essentially noninteractive, cachectic, with open wound on her posterior, and known Covid infection, with poor prognosis.  Additional labs, imaging not indicated.  Patient did have Covid swab, x-ray performed while disposition was pending.   After several conversations with our hospitalist colleagues, patient will have a change  in medications to facilitate end-of-life comfort.  All prescriptions sent.  Final Clinical Impression(s) / ED Diagnoses Final diagnoses:  COVID-19 virus infection      Carmin Muskrat, MD 07/14/2019 1545    Carmin Muskrat, MD 07/15/2019 [phone]

## 2019-07-05 NOTE — ED Notes (Signed)
[place] to give discharge instructions, was sent to voicemail. Left message informing patient will be returning with Hospice care.

## 2019-07-05 NOTE — ED Triage Notes (Signed)
Per [facility] pt comes from [facility] for unresponsiveness for two days. Staff reported to EMS that is Covid+ but couldn't find the documentation, they thought she was tested last week. Pt is Hospice patient. Pt has DNR

## 2019-07-05 NOTE — Progress Notes (Addendum)
Palliative Medicine RN Note: Rec'd call from Dr Vanita Panda regarding Lisa James, who is in the ED at [facility]. Per his report, her facility sent her in after 2 days of unresponsiveness despite DNR status and being enrolled in hospice. Facility staff also reported that she was COVID +.   Dr Vanita Panda has been unable to reach the [facility] liaison listed in [place], as have I. I called the office & spoke w Lisa James. She is attempting to find the RN liaison who can help me; [facility] was not notified that the pt was being transferred to the hospital. They did confirm that an [facility] RN saw the pt on Saturday, and that there is likely NOT a COVID bed at [facility] for Lisa James.  I called Dr Vanita Panda back to update him on these conversations; he will proceed with a plan of care from there.  Lisa James Lisa Norman, RN, BSN, [facility] Palliative Medicine Team 07/04/2019 3:14 PM Office [phone]   ADDENDUM: Rec'd a call back from Lisa James. She will investigate COVID diagnosis, as they are not aware of it either. She will call me when she finds out more; if I have left for the day, she will update the Epic chart.  Lisa James Lisa Ohlin, RN, BSN, [facility] Palliative Medicine Team 07/04/2019 3:24 PM Office [phone]

## 2019-07-05 NOTE — Progress Notes (Signed)
[facility] ED -- Manufacturing engineer [facility]) RN Note at 71  Pt is currently an active hospice patient with [facility]. [facility] transferred patient to Emergency Department for evaluation of unresponsiveness. According to interim DON Simeon Craft [phone]) at [facility], pt was unresponsive this morning and sent to ED. Hospice was not notified of transfer or that patient was Covid +.  Phone call to interim DON at [facility] states patient was tested on the 12.10.20 for Covid and found to be positive.   Phone call with Larina Bras, RN with PMT she reports that Dr. Vanita Panda does not feel patient has long. [place] was requested. Unable to accommodate a Covid+ patient at this time at [facility].  Discussed with DON at [facility] for patient to return to facility for end of life care, which she was accepted. Informed Dr. Vanita Panda who also agreed to write prescriptions to be e-prescribed to [place] 20mg /ml  10 mg Q 8 hours ATC and 10 mg Q 4 hours prn   And  Haldol 2mg /ml 4 mg Q 8 hours ATC and 4 mg Q 4 hours prn agitation  In prefilled syringes.  Dorrene German and [place] speaking with niece in ongoing conversations.  Please call [facility] for transportation as [employer] with [facility] for active hospice patients.  Please call with any hospice related questions or concerns.  Thank you, Margaretmary Eddy, RN, BSN [facility] Liaison [phone]  [place] are on [place]

## 2019-07-05 NOTE — Discharge Instructions (Addendum)
Hospice services are aware of your return home. You have also been prescribed medication both around-the-clock and for breakthrough pain.

## 2019-07-05 NOTE — ED Notes (Signed)
Date and time results received: 07/07/2019 1701 (use smartphrase ".now" to insert current time)  Test: COVID Critical Value: Positive  Name of Provider Notified: Vanita Panda, MD  Orders Received? Or Actions Taken?: Orders Received - See Orders for details

## 2019-07-05 NOTE — ED Notes (Signed)
Called for [facility] patient) transport back to [facility].

## 2019-07-05 NOTE — ED Notes (Addendum)
[facility] truck on scene, Industrial/product designer as facility not aware that patient is coming back due to ED staff not able to get anyone to answer phone and pt's BP being very soft.

## 2019-07-05 NOTE — ED Notes (Signed)
EMS reports that according to shift commander the facility is only assisted living and no one there can give "skilled car or administer medications that this patient is requiring", so they aren't able to transport patient back to [facility]. This RN informed EMS to bring patient back in to the ED room she was in.

## 2019-07-15 ENCOUNTER — Ambulatory Visit: Payer: Medicare Other | Admitting: Hematology

## 2019-07-23 DEATH — deceased

## 2019-08-09 NOTE — Telephone Encounter (Signed)
[employer] PAF was approved 08/09/19-07/21/20.  [employer] phone [phone]  [place] Patient [facility] Phone [phone] Fax [phone] 08/09/2019 8:27 AM

## 2019-12-27 IMAGING — DX DG SHOULDER 2+V PORT*R*
1 series · 1 of 1 positions shown · non-contrast
Comparison: None.

CLINICAL DATA: Postop films

EXAM:
PORTABLE RIGHT SHOULDER

[shoulder]
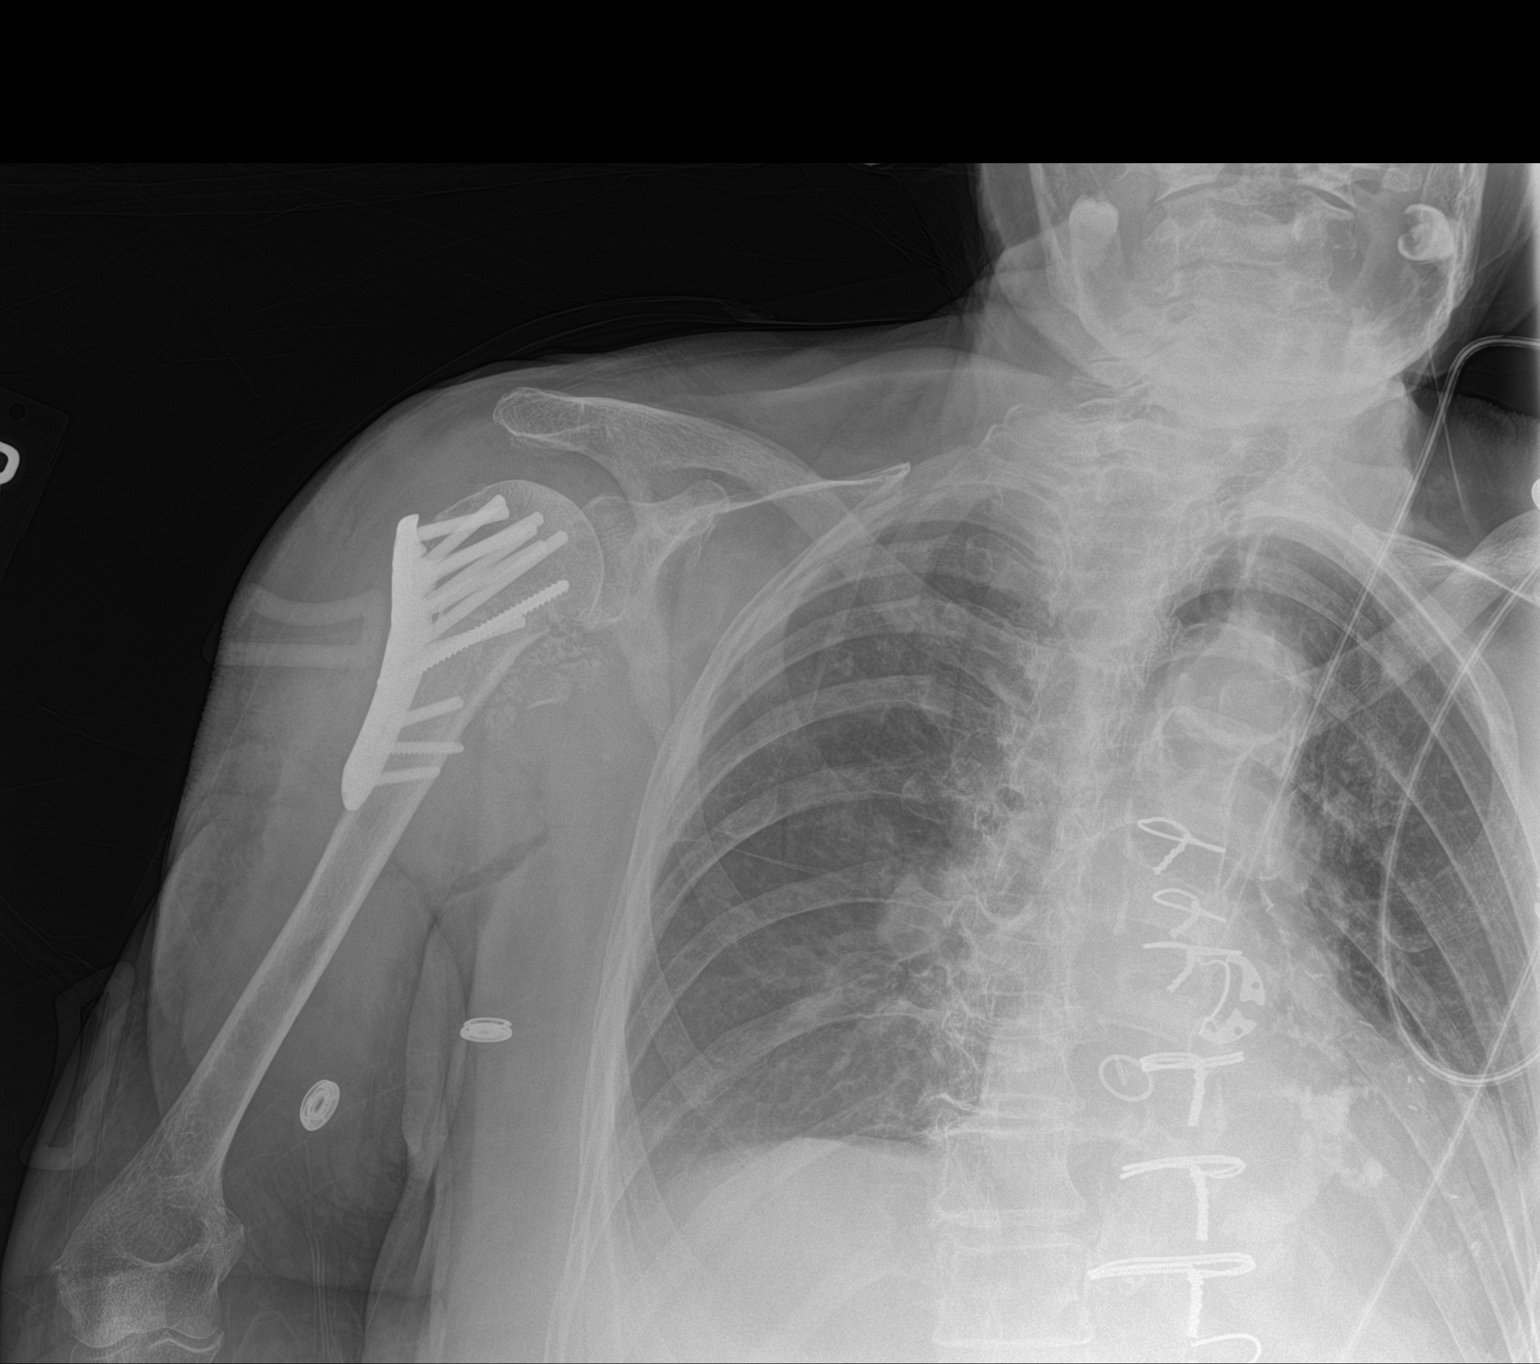

[1 of 1 positions shown; findings below may reference images not displayed]

FINDINGS: Interval repair of right shoulder fracture. Bony fragments are seen
medial to the proximal humerus. No other acute abnormalities.
IMPRESSION: Fracture repair with remaining bony fragments as above.

## 2019-12-27 IMAGING — RF DG C-ARM 61-120 MIN
1 series · 2 of 2 positions shown · non-contrast
Comparison: None.

CLINICAL DATA: Repair of right shoulder fracture

FLUOROSCOPY TIME:  13 seconds.
Images: 2
EXAM:
RIGHT SHOULDER - 2+ VIEW; DG C-ARM 61-120 MIN

[Series 1: run · 2 of 2 slices shown]
[im 1/2]
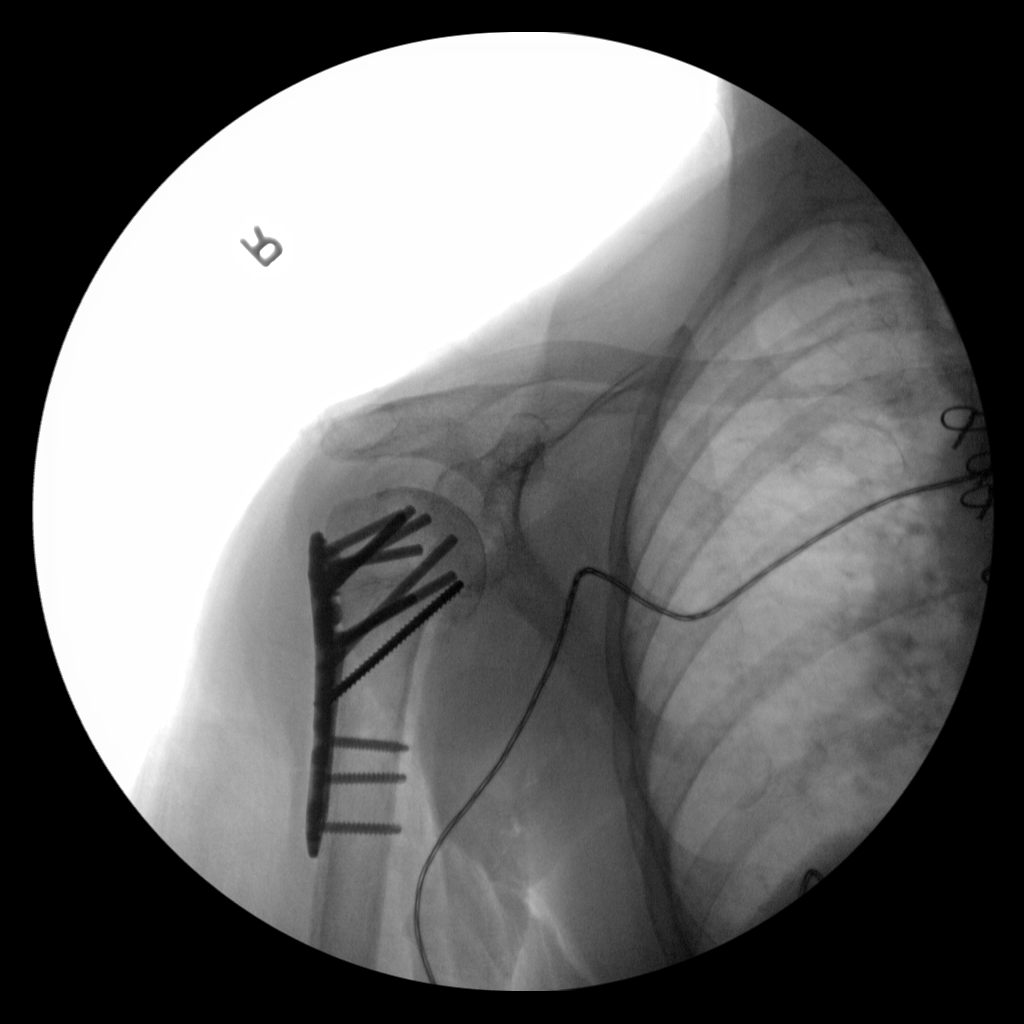
[im 2/2]
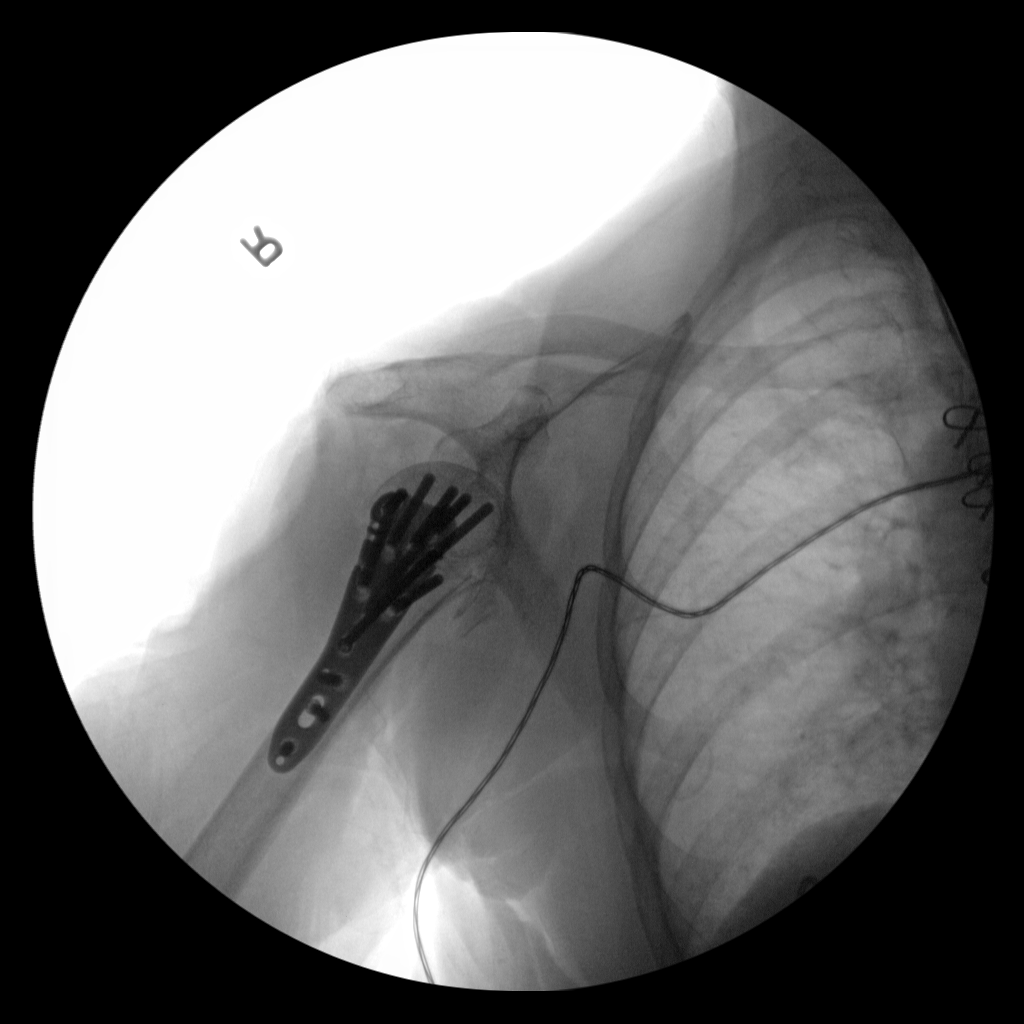

[2 of 2 positions shown; findings below may reference images not displayed]

FINDINGS: A plate has been affixed to the proximal humerus, crossing the
fracture site. There are multiple affixing screws.
IMPRESSION: Fracture repair as above.

## 2020-02-10 IMAGING — CR DG HIP (WITH OR WITHOUT PELVIS) 2-3V*R*
3 series · 3 of 3 positions shown · non-contrast
Comparison: None

CLINICAL DATA: Fell twice today, RIGHT hip pain

EXAM:
DG HIP (WITH OR WITHOUT PELVIS) 2-3V RIGHT

[pelvis ap]
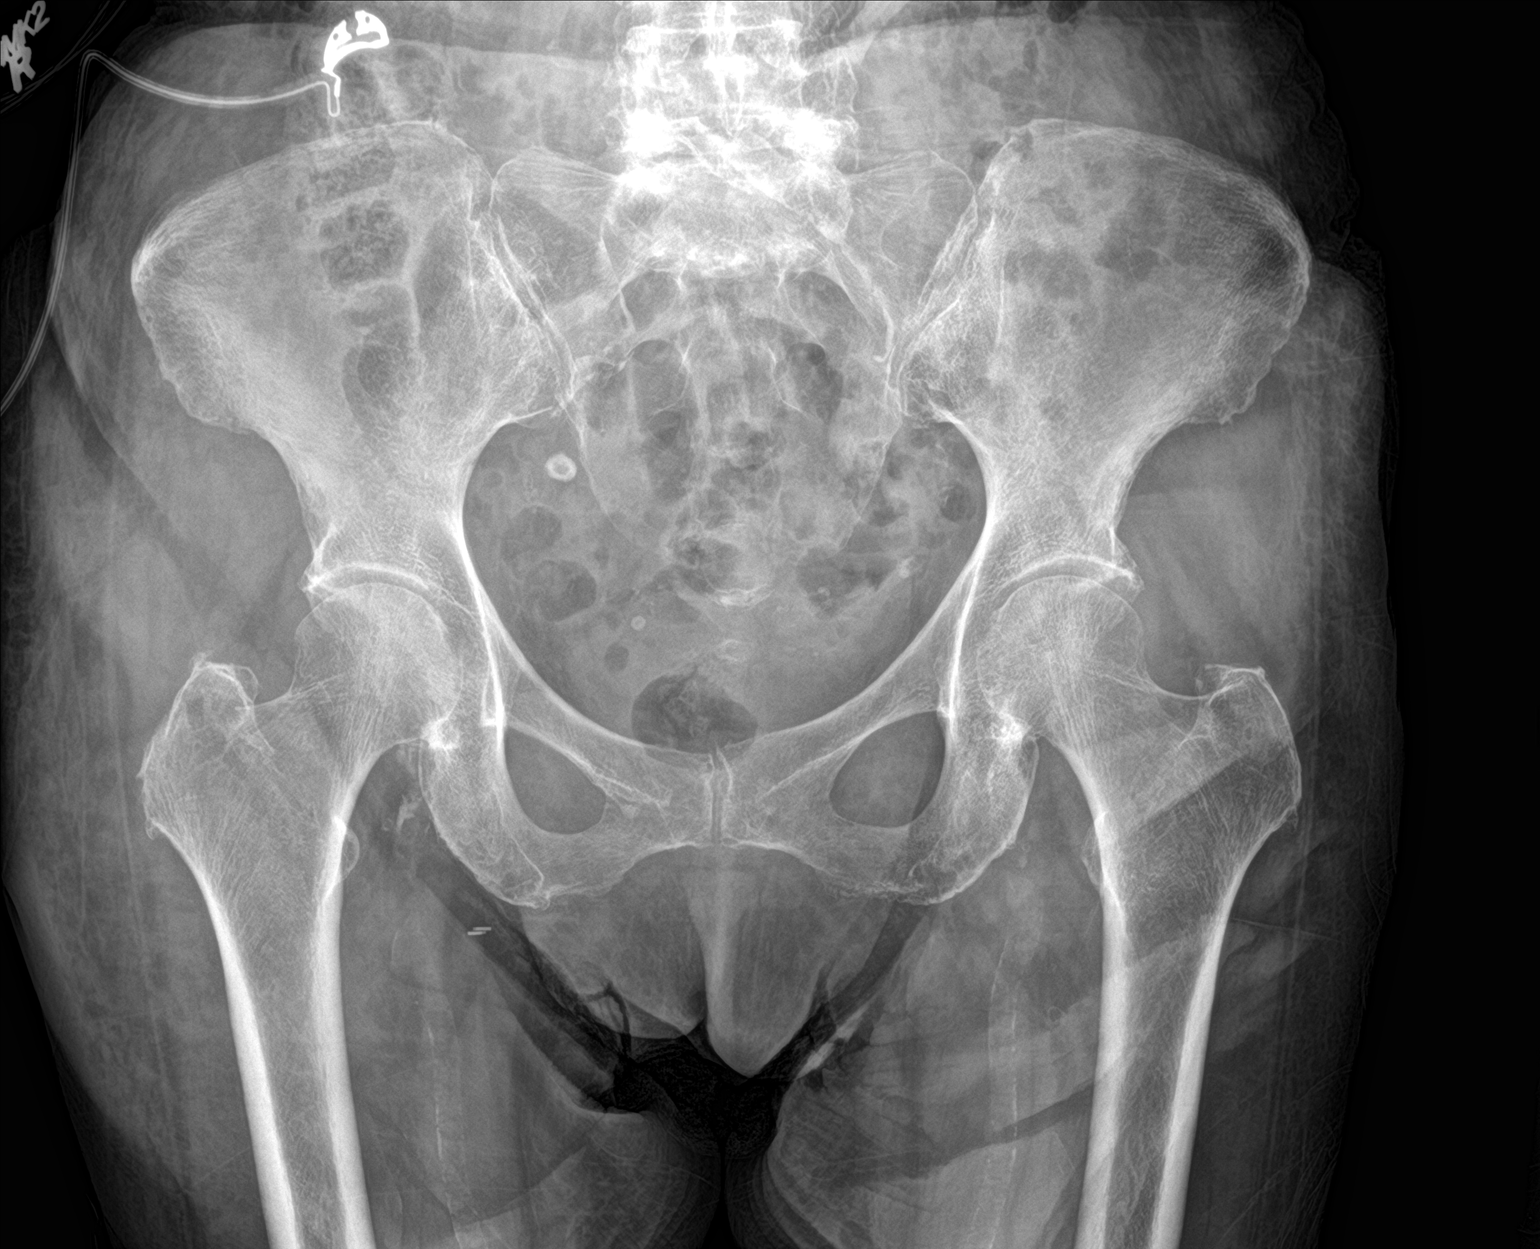

[hip ap]
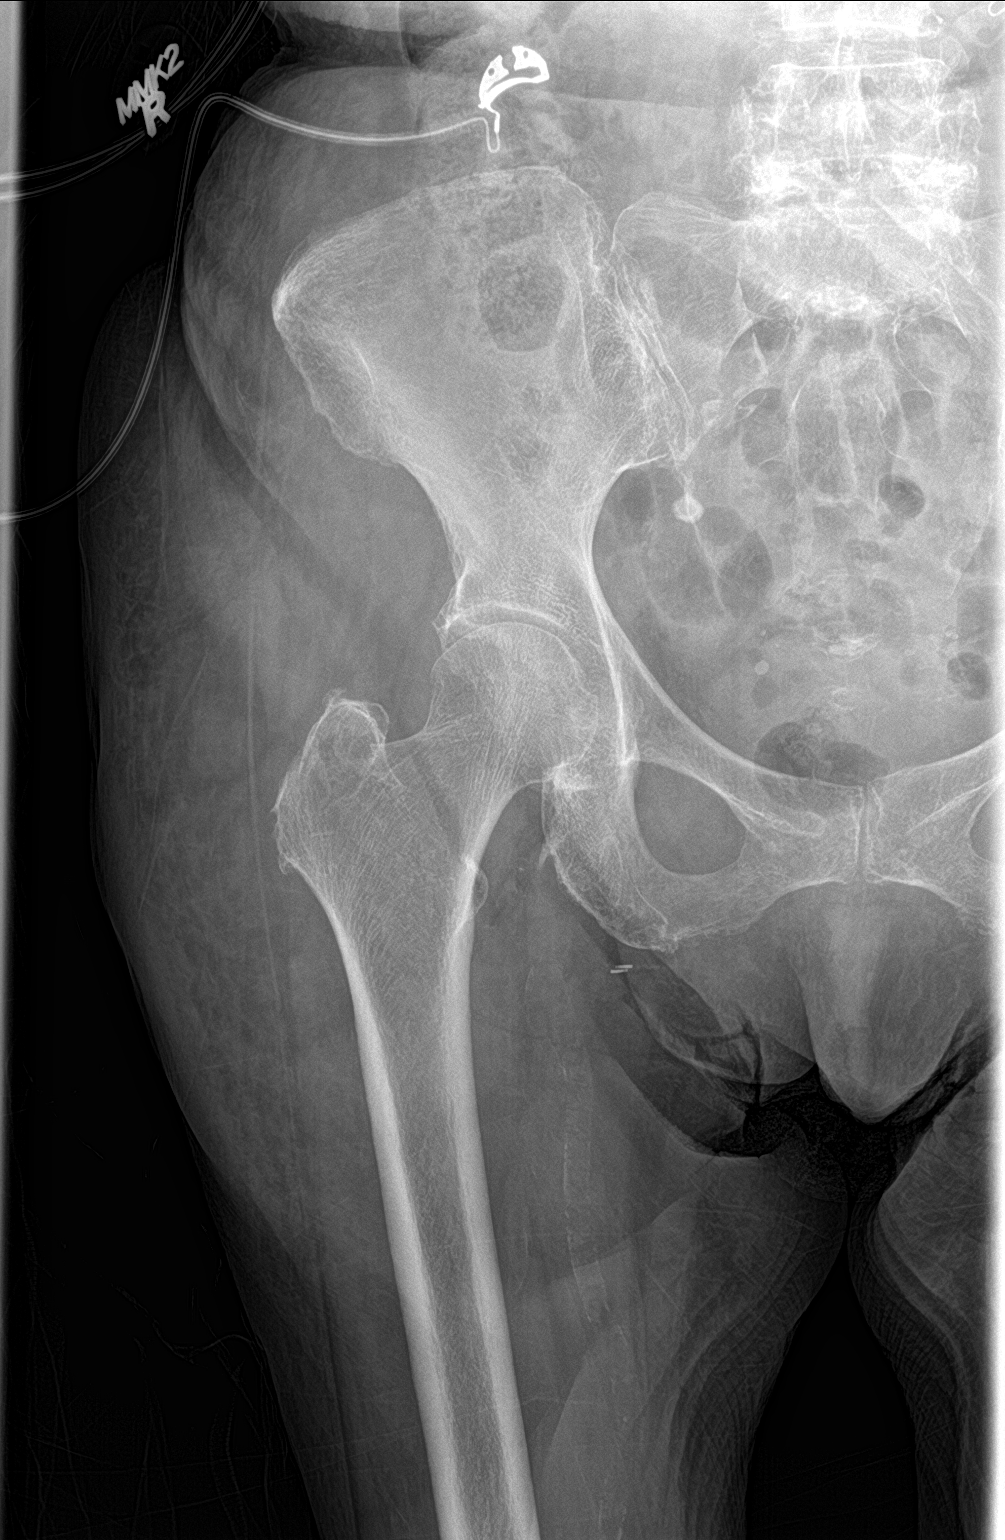

[hip lat]
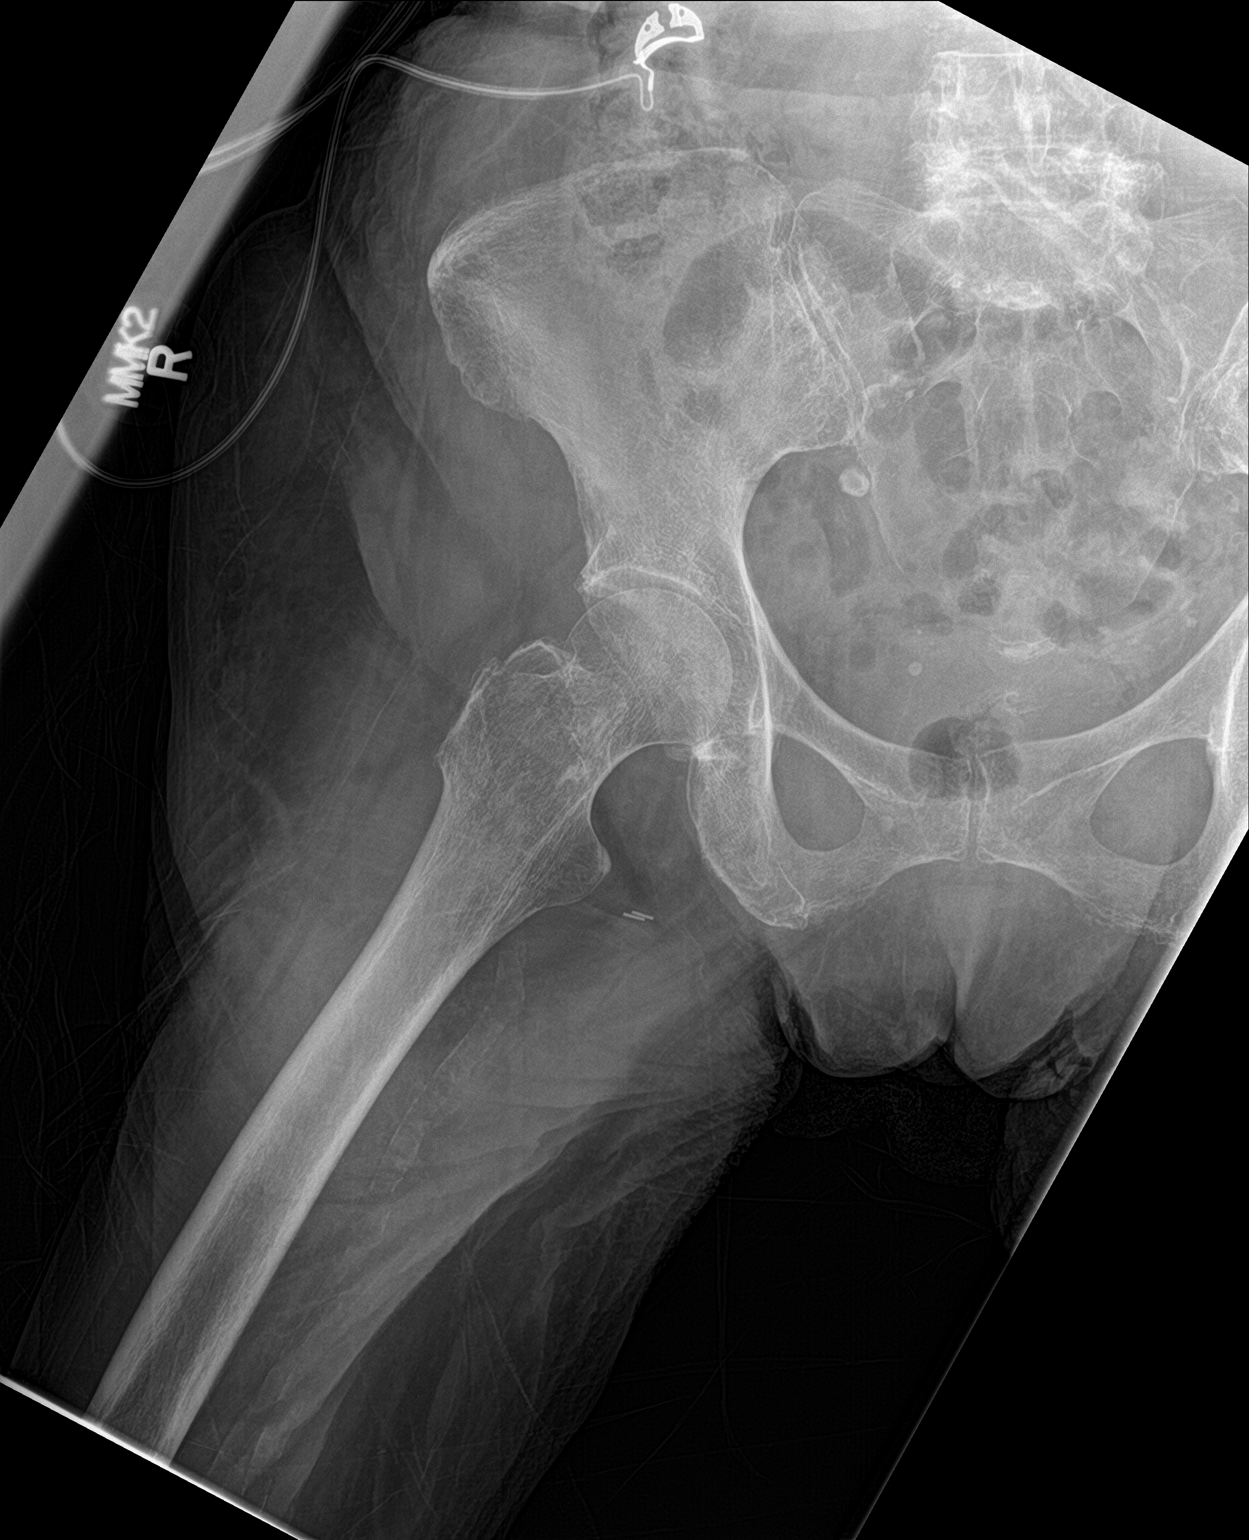

[3 of 3 positions shown; findings below may reference images not displayed]

FINDINGS: Osseous demineralization.

Hip and SI joint spaces preserved.

Probable nondisplaced intertrochanteric fracture RIGHT femur.

No additional fracture or dislocation.

Few pelvic phleboliths and scattered atherosclerotic calcifications.

Soft tissue swelling laterally overlying the lateral RIGHT hip
region.
IMPRESSION: Osseous demineralization with a probable nondisplaced
intertrochanteric fracture of the RIGHT femur.

## 2021-04-16 ENCOUNTER — Encounter: Payer: Self-pay | Admitting: Hematology & Oncology
